# Patient Record
Sex: Male | Born: 1970 | Race: Black or African American | Hispanic: No | Marital: Single | Smoking: Never smoker
Health system: Southern US, Community
[De-identification: ages and names within clinical notes are randomized; demographics above are authoritative.]

## PROBLEM LIST (undated history)

## (undated) ENCOUNTER — Emergency Department (HOSPITAL_COMMUNITY): Admission: EM | Payer: Medicare Other | Source: Home / Self Care

## (undated) DIAGNOSIS — I4891 Unspecified atrial fibrillation: Secondary | ICD-10-CM

## (undated) DIAGNOSIS — F329 Major depressive disorder, single episode, unspecified: Secondary | ICD-10-CM

## (undated) DIAGNOSIS — I499 Cardiac arrhythmia, unspecified: Secondary | ICD-10-CM

## (undated) DIAGNOSIS — E66812 Obesity, class 2: Secondary | ICD-10-CM

## (undated) DIAGNOSIS — R002 Palpitations: Secondary | ICD-10-CM

## (undated) DIAGNOSIS — F10239 Alcohol dependence with withdrawal, unspecified: Secondary | ICD-10-CM

## (undated) DIAGNOSIS — F319 Bipolar disorder, unspecified: Secondary | ICD-10-CM

## (undated) DIAGNOSIS — E785 Hyperlipidemia, unspecified: Secondary | ICD-10-CM

## (undated) DIAGNOSIS — F419 Anxiety disorder, unspecified: Secondary | ICD-10-CM

## (undated) DIAGNOSIS — R569 Unspecified convulsions: Secondary | ICD-10-CM

## (undated) DIAGNOSIS — I10 Essential (primary) hypertension: Secondary | ICD-10-CM

## (undated) DIAGNOSIS — F1410 Cocaine abuse, uncomplicated: Secondary | ICD-10-CM

## (undated) DIAGNOSIS — F32A Depression, unspecified: Secondary | ICD-10-CM

## (undated) DIAGNOSIS — F4310 Post-traumatic stress disorder, unspecified: Secondary | ICD-10-CM

## (undated) DIAGNOSIS — R55 Syncope and collapse: Secondary | ICD-10-CM

## (undated) DIAGNOSIS — E669 Obesity, unspecified: Secondary | ICD-10-CM

## (undated) HISTORY — PX: NO PAST SURGERIES: SHX2092

## (undated) HISTORY — DX: Syncope and collapse: R55

## (undated) HISTORY — DX: Hyperlipidemia, unspecified: E78.5

## (undated) HISTORY — DX: Cardiac arrhythmia, unspecified: I49.9

## (undated) NOTE — *Deleted (*Deleted)
   06/26/20 0608  Vital Signs  Pulse Rate 92  BP 131/79  BP Location Right Arm  BP Method Automatic  Patient Position (if appropriate) Standing   D: Pt. Admitted to some passive SI, but denied HI/AVH. Patient rated anxiety 8/10 and depression 7/10. Pt. Reported that he needed to make calls in the morning. Pt. Attended group in the morning.  A:  Patient took scheduled medicine.  Pt. Could not get a second dose of vistaril form anxiety because it was too close to the first dose. Pt. Was ok with this. Support and encouragement provided Routine safety checks conducted every 15 minutes. Patient  Informed to notify staff with any concerns.   R:  Safety maintained.  Special Note:  Pt. Needs to be discharged tomorrow at 0645. Taxi needs to be called. [place] bus [place].

---

## 2000-04-04 ENCOUNTER — Emergency Department (HOSPITAL_COMMUNITY): Admission: EM | Admit: 2000-04-04 | Discharge: 2000-04-04 | Payer: Self-pay | Admitting: Emergency Medicine

## 2000-04-04 ENCOUNTER — Encounter: Payer: Self-pay | Admitting: Emergency Medicine

## 2002-02-13 ENCOUNTER — Emergency Department (HOSPITAL_COMMUNITY): Admission: EM | Admit: 2002-02-13 | Discharge: 2002-02-13 | Payer: Self-pay | Admitting: Emergency Medicine

## 2002-04-05 ENCOUNTER — Encounter: Payer: Self-pay | Admitting: Emergency Medicine

## 2002-04-05 ENCOUNTER — Emergency Department (HOSPITAL_COMMUNITY): Admission: EM | Admit: 2002-04-05 | Discharge: 2002-04-05 | Payer: Self-pay | Admitting: Emergency Medicine

## 2002-04-14 ENCOUNTER — Emergency Department (HOSPITAL_COMMUNITY): Admission: EM | Admit: 2002-04-14 | Discharge: 2002-04-14 | Payer: Self-pay | Admitting: Emergency Medicine

## 2002-04-15 ENCOUNTER — Emergency Department (HOSPITAL_COMMUNITY): Admission: EM | Admit: 2002-04-15 | Discharge: 2002-04-15 | Payer: Self-pay | Admitting: Emergency Medicine

## 2002-04-22 ENCOUNTER — Emergency Department (HOSPITAL_COMMUNITY): Admission: EM | Admit: 2002-04-22 | Discharge: 2002-04-22 | Payer: Self-pay | Admitting: Emergency Medicine

## 2002-05-02 ENCOUNTER — Emergency Department (HOSPITAL_COMMUNITY): Admission: EM | Admit: 2002-05-02 | Discharge: 2002-05-02 | Payer: Self-pay | Admitting: *Deleted

## 2002-05-04 ENCOUNTER — Emergency Department (HOSPITAL_COMMUNITY): Admission: EM | Admit: 2002-05-04 | Discharge: 2002-05-04 | Payer: Self-pay | Admitting: Emergency Medicine

## 2002-08-31 ENCOUNTER — Emergency Department (HOSPITAL_COMMUNITY): Admission: EM | Admit: 2002-08-31 | Discharge: 2002-08-31 | Payer: Self-pay | Admitting: Emergency Medicine

## 2002-10-18 ENCOUNTER — Emergency Department (HOSPITAL_COMMUNITY): Admission: EM | Admit: 2002-10-18 | Discharge: 2002-10-18 | Payer: Self-pay | Admitting: Emergency Medicine

## 2002-11-08 ENCOUNTER — Emergency Department (HOSPITAL_COMMUNITY): Admission: EM | Admit: 2002-11-08 | Discharge: 2002-11-08 | Payer: Self-pay | Admitting: *Deleted

## 2002-12-27 ENCOUNTER — Emergency Department (HOSPITAL_COMMUNITY): Admission: EM | Admit: 2002-12-27 | Discharge: 2002-12-27 | Payer: Self-pay

## 2003-04-02 ENCOUNTER — Emergency Department (HOSPITAL_COMMUNITY): Admission: AD | Admit: 2003-04-02 | Discharge: 2003-04-02 | Payer: Self-pay | Admitting: Emergency Medicine

## 2003-05-19 ENCOUNTER — Emergency Department (HOSPITAL_COMMUNITY): Admission: EM | Admit: 2003-05-19 | Discharge: 2003-05-19 | Payer: Self-pay | Admitting: Emergency Medicine

## 2003-08-29 ENCOUNTER — Emergency Department (HOSPITAL_COMMUNITY): Admission: EM | Admit: 2003-08-29 | Discharge: 2003-08-29 | Payer: Self-pay | Admitting: Emergency Medicine

## 2003-12-15 ENCOUNTER — Emergency Department (HOSPITAL_COMMUNITY): Admission: EM | Admit: 2003-12-15 | Discharge: 2003-12-15 | Payer: Self-pay | Admitting: Emergency Medicine

## 2003-12-28 ENCOUNTER — Emergency Department (HOSPITAL_COMMUNITY): Admission: EM | Admit: 2003-12-28 | Discharge: 2003-12-28 | Payer: Self-pay | Admitting: Emergency Medicine

## 2004-01-10 ENCOUNTER — Emergency Department (HOSPITAL_COMMUNITY): Admission: EM | Admit: 2004-01-10 | Discharge: 2004-01-10 | Payer: Self-pay | Admitting: Emergency Medicine

## 2004-03-26 ENCOUNTER — Emergency Department (HOSPITAL_COMMUNITY): Admission: EM | Admit: 2004-03-26 | Discharge: 2004-03-26 | Payer: Self-pay | Admitting: Emergency Medicine

## 2004-04-28 ENCOUNTER — Emergency Department (HOSPITAL_COMMUNITY): Admission: EM | Admit: 2004-04-28 | Discharge: 2004-04-28 | Payer: Self-pay | Admitting: Emergency Medicine

## 2004-05-01 ENCOUNTER — Emergency Department (HOSPITAL_COMMUNITY): Admission: EM | Admit: 2004-05-01 | Discharge: 2004-05-01 | Payer: Self-pay | Admitting: *Deleted

## 2004-05-13 ENCOUNTER — Emergency Department (HOSPITAL_COMMUNITY): Admission: EM | Admit: 2004-05-13 | Discharge: 2004-05-13 | Payer: Self-pay | Admitting: Emergency Medicine

## 2004-06-03 ENCOUNTER — Inpatient Hospital Stay (HOSPITAL_COMMUNITY): Admission: EM | Admit: 2004-06-03 | Discharge: 2004-06-06 | Payer: Self-pay | Admitting: Emergency Medicine

## 2004-07-04 ENCOUNTER — Emergency Department (HOSPITAL_COMMUNITY): Admission: EM | Admit: 2004-07-04 | Discharge: 2004-07-05 | Payer: Self-pay | Admitting: Emergency Medicine

## 2004-08-17 ENCOUNTER — Emergency Department (HOSPITAL_COMMUNITY): Admission: EM | Admit: 2004-08-17 | Discharge: 2004-08-17 | Payer: Self-pay | Admitting: Emergency Medicine

## 2004-10-10 ENCOUNTER — Emergency Department (HOSPITAL_COMMUNITY): Admission: EM | Admit: 2004-10-10 | Discharge: 2004-10-10 | Payer: Self-pay | Admitting: Emergency Medicine

## 2004-10-14 ENCOUNTER — Emergency Department (HOSPITAL_COMMUNITY): Admission: EM | Admit: 2004-10-14 | Discharge: 2004-10-14 | Payer: Self-pay | Admitting: Emergency Medicine

## 2004-10-15 ENCOUNTER — Emergency Department (HOSPITAL_COMMUNITY): Admission: EM | Admit: 2004-10-15 | Discharge: 2004-10-16 | Payer: Self-pay | Admitting: Emergency Medicine

## 2005-04-30 ENCOUNTER — Emergency Department (HOSPITAL_COMMUNITY): Admission: EM | Admit: 2005-04-30 | Discharge: 2005-04-30 | Payer: Self-pay | Admitting: Emergency Medicine

## 2005-11-13 ENCOUNTER — Emergency Department (HOSPITAL_COMMUNITY): Admission: EM | Admit: 2005-11-13 | Discharge: 2005-11-13 | Payer: Self-pay | Admitting: Emergency Medicine

## 2006-02-10 ENCOUNTER — Emergency Department (HOSPITAL_COMMUNITY): Admission: EM | Admit: 2006-02-10 | Discharge: 2006-02-10 | Payer: Self-pay | Admitting: Emergency Medicine

## 2007-05-30 ENCOUNTER — Emergency Department (HOSPITAL_COMMUNITY): Admission: EM | Admit: 2007-05-30 | Discharge: 2007-05-30 | Payer: Self-pay | Admitting: Emergency Medicine

## 2007-06-03 ENCOUNTER — Emergency Department (HOSPITAL_COMMUNITY): Admission: EM | Admit: 2007-06-03 | Discharge: 2007-06-03 | Payer: Self-pay | Admitting: Emergency Medicine

## 2007-10-20 ENCOUNTER — Emergency Department (HOSPITAL_COMMUNITY): Admission: EM | Admit: 2007-10-20 | Discharge: 2007-10-20 | Payer: Self-pay | Admitting: Emergency Medicine

## 2007-11-17 ENCOUNTER — Emergency Department (HOSPITAL_COMMUNITY): Admission: EM | Admit: 2007-11-17 | Discharge: 2007-11-17 | Payer: Self-pay | Admitting: Emergency Medicine

## 2008-01-25 ENCOUNTER — Emergency Department (HOSPITAL_COMMUNITY): Admission: EM | Admit: 2008-01-25 | Discharge: 2008-01-25 | Payer: Self-pay | Admitting: Emergency Medicine

## 2008-01-26 ENCOUNTER — Ambulatory Visit: Payer: Self-pay | Admitting: Psychiatry

## 2008-01-26 ENCOUNTER — Inpatient Hospital Stay (HOSPITAL_COMMUNITY): Admission: EM | Admit: 2008-01-26 | Discharge: 2008-01-27 | Payer: Self-pay | Admitting: Emergency Medicine

## 2008-03-09 ENCOUNTER — Emergency Department (HOSPITAL_COMMUNITY): Admission: EM | Admit: 2008-03-09 | Discharge: 2008-03-09 | Payer: Self-pay | Admitting: Emergency Medicine

## 2008-05-04 ENCOUNTER — Emergency Department (HOSPITAL_COMMUNITY): Admission: EM | Admit: 2008-05-04 | Discharge: 2008-05-04 | Payer: Self-pay | Admitting: Emergency Medicine

## 2008-06-18 ENCOUNTER — Emergency Department (HOSPITAL_COMMUNITY): Admission: EM | Admit: 2008-06-18 | Discharge: 2008-06-18 | Payer: Self-pay | Admitting: Emergency Medicine

## 2008-11-04 ENCOUNTER — Emergency Department (HOSPITAL_COMMUNITY): Admission: EM | Admit: 2008-11-04 | Discharge: 2008-11-04 | Payer: Self-pay | Admitting: Emergency Medicine

## 2008-11-06 ENCOUNTER — Emergency Department (HOSPITAL_COMMUNITY): Admission: EM | Admit: 2008-11-06 | Discharge: 2008-11-06 | Payer: Self-pay | Admitting: Emergency Medicine

## 2009-02-19 ENCOUNTER — Emergency Department (HOSPITAL_COMMUNITY): Admission: EM | Admit: 2009-02-19 | Discharge: 2009-02-19 | Payer: Self-pay | Admitting: Emergency Medicine

## 2009-03-22 ENCOUNTER — Emergency Department (HOSPITAL_COMMUNITY): Admission: EM | Admit: 2009-03-22 | Discharge: 2009-03-22 | Payer: Self-pay | Admitting: Emergency Medicine

## 2009-05-03 ENCOUNTER — Emergency Department (HOSPITAL_COMMUNITY): Admission: EM | Admit: 2009-05-03 | Discharge: 2009-05-03 | Payer: Self-pay | Admitting: Emergency Medicine

## 2009-08-05 ENCOUNTER — Emergency Department (HOSPITAL_COMMUNITY): Admission: EM | Admit: 2009-08-05 | Discharge: 2009-08-05 | Payer: Self-pay | Admitting: Emergency Medicine

## 2009-10-31 ENCOUNTER — Emergency Department (HOSPITAL_COMMUNITY): Admission: EM | Admit: 2009-10-31 | Discharge: 2009-10-31 | Payer: Self-pay | Admitting: Emergency Medicine

## 2010-01-14 ENCOUNTER — Emergency Department (HOSPITAL_COMMUNITY): Admission: EM | Admit: 2010-01-14 | Discharge: 2010-01-15 | Payer: Self-pay | Admitting: Emergency Medicine

## 2010-02-16 ENCOUNTER — Emergency Department (HOSPITAL_COMMUNITY): Admission: EM | Admit: 2010-02-16 | Discharge: 2010-02-16 | Payer: Self-pay | Admitting: Emergency Medicine

## 2010-03-03 ENCOUNTER — Emergency Department (HOSPITAL_COMMUNITY): Admission: EM | Admit: 2010-03-03 | Discharge: 2010-03-04 | Payer: Self-pay | Admitting: Emergency Medicine

## 2010-03-04 ENCOUNTER — Other Ambulatory Visit: Payer: Self-pay | Admitting: Emergency Medicine

## 2010-03-04 ENCOUNTER — Ambulatory Visit: Payer: Self-pay | Admitting: Psychiatry

## 2010-03-04 ENCOUNTER — Inpatient Hospital Stay (HOSPITAL_COMMUNITY): Admission: AD | Admit: 2010-03-04 | Discharge: 2010-03-11 | Payer: Self-pay | Admitting: Psychiatry

## 2010-04-16 ENCOUNTER — Emergency Department (HOSPITAL_COMMUNITY): Admission: EM | Admit: 2010-04-16 | Discharge: 2010-04-17 | Payer: Self-pay | Admitting: Emergency Medicine

## 2010-07-12 ENCOUNTER — Emergency Department (HOSPITAL_COMMUNITY): Admission: EM | Admit: 2010-07-12 | Discharge: 2010-07-12 | Payer: Self-pay | Admitting: Emergency Medicine

## 2010-09-10 ENCOUNTER — Emergency Department (HOSPITAL_COMMUNITY)
Admission: EM | Admit: 2010-09-10 | Discharge: 2010-09-10 | Payer: Self-pay | Source: Home / Self Care | Admitting: Emergency Medicine

## 2010-09-10 ENCOUNTER — Inpatient Hospital Stay (HOSPITAL_COMMUNITY)
Admission: AD | Admit: 2010-09-10 | Discharge: 2010-09-20 | Payer: Self-pay | Source: Home / Self Care | Attending: Psychiatry | Admitting: Psychiatry

## 2010-09-16 LAB — DIFFERENTIAL
Basophils Absolute: 0 10*3/uL (ref 0.0–0.1)
Basophils Relative: 1 % (ref 0–1)
Eosinophils Absolute: 0.1 10*3/uL (ref 0.0–0.7)
Eosinophils Relative: 1 % (ref 0–5)
Lymphocytes Relative: 22 % (ref 12–46)
Lymphs Abs: 1.8 10*3/uL (ref 0.7–4.0)
Monocytes Absolute: 0.8 10*3/uL (ref 0.1–1.0)
Monocytes Relative: 9 % (ref 3–12)
Neutro Abs: 5.7 10*3/uL (ref 1.7–7.7)
Neutrophils Relative %: 67 % (ref 43–77)

## 2010-09-16 LAB — CBC
HCT: 42.3 % (ref 39.0–52.0)
Hemoglobin: 14.1 g/dL (ref 13.0–17.0)
MCH: 27.8 pg (ref 26.0–34.0)
MCHC: 33.3 g/dL (ref 30.0–36.0)
MCV: 83.4 fL (ref 78.0–100.0)
Platelets: 263 10*3/uL (ref 150–400)
RBC: 5.07 MIL/uL (ref 4.22–5.81)
RDW: 14.4 % (ref 11.5–15.5)
WBC: 8.5 10*3/uL (ref 4.0–10.5)

## 2010-09-16 LAB — COMPREHENSIVE METABOLIC PANEL
ALT: 16 U/L (ref 0–53)
AST: 23 U/L (ref 0–37)
Albumin: 4.1 g/dL (ref 3.5–5.2)
Alkaline Phosphatase: 97 U/L (ref 39–117)
BUN: 6 mg/dL (ref 6–23)
CO2: 23 mEq/L (ref 19–32)
Calcium: 9.1 mg/dL (ref 8.4–10.5)
Chloride: 108 mEq/L (ref 96–112)
Creatinine, Ser: 0.98 mg/dL (ref 0.4–1.5)
GFR calc Af Amer: >60 mL/min (ref >60)
GFR calc non Af Amer: >60 mL/min (ref >60)
Glucose, Bld: 87 mg/dL (ref 70–99)
Potassium: 3.6 mEq/L (ref 3.5–5.1)
Sodium: 140 mEq/L (ref 135–145)
Total Bilirubin: 0.8 mg/dL (ref 0.3–1.2)
Total Protein: 7.8 g/dL (ref 6.0–8.3)

## 2010-09-16 LAB — RAPID URINE DRUG SCREEN, HOSP PERFORMED
Amphetamines: NOT DETECTED
Barbiturates: NOT DETECTED
Benzodiazepines: POSITIVE — AB
Cocaine: POSITIVE — AB
Opiates: NOT DETECTED
Tetrahydrocannabinol: NOT DETECTED

## 2010-09-16 LAB — ETHANOL: Alcohol, Ethyl (B): <5 mg/dL (ref 0–10)

## 2010-09-23 NOTE — Discharge Summary (Addendum)
[name], [name] NO.:  [id number]  MEDICAL RECORD NO.:  [id number]          PATIENT TYPE:  IPS  LOCATION:  0407                          [facility]ACILITY[facility]  [facility]  PHYSICIAN:  Anselm Jungling, MD  DATE OF BIRTH:  11/19/1970  DATE OF ADMISSION:  09/10/2010 DATE OF DISCHARGE:  09/20/2010                              DISCHARGE SUMMARY   IDENTIFYING DATA AND REASON FOR ADMISSION:  This was an inpatient psychiatric admission for [name], a 40 year old, single, African American male who was admitted because of increasing depression, suicidal ideation, and benzodiazepine dependence.  Please refer to the admission note for further details pertaining to the symptoms, circumstances and history that led to his hospitalization.  He was given an initial Axis I diagnosis of schizoaffective disorder NOS.  MEDICAL AND LABORATORY:  The patient was medically and physically assessed by the psychiatric nurse practitioner.  He was in good health without any active or chronic medical problems, except for hypertension. He was continued on his usual regimen of lisinopril and Norvasc.  At the time of discharge, he was referred to his usual medical provider for follow-up of same.  HOSPITAL COURSE:  The patient was admitted to the adult inpatient psychiatric service.  He presented as a well-nourished, normally- developed, adult male who was pleasant and cooperative, but quite depressed, shy, reserved, and internally preoccupied.  However, there did not appear to be any significant psychosis or thought disorder. Nonetheless, he had been relying on Ativan for anxiety, and it was felt that it was not in his best interest to continue on this medication owing to its potential for habituation.  He was tapered off of Ativan with Librium, and a trial of risperidone, low-dose, was initiated to address anxiety symptoms.  This proved to be very helpful and well tolerated.  In addition, he was started  on a trial of Lexapro 10 mg daily for depression.  He participated in therapeutic groups and activities, and was a good participant with good attendance and as his stay progressed, he began to open up a bit more.  Improvement in his mood was gradual and day-by-day.  He indicated by the time of discharge that he was no longer having any suicidal thoughts. He was smiling appropriately at times, and indicated that he felt more confident about discharge.  He was appropriate for discharge on the 11th hospital day and agreed to the following aftercare plan.  AFTERCARE:  The patient was to [facility] up [facility], under Dr. Clyde Canterbury.  DISCHARGE MEDICATIONS: 1. Lexapro 10 mg daily. 2. Risperdal 1 mg b.i.d. and 2 mg q.h.s. 3. Lisinopril 20 mg daily. 4. Norvasc 10 mg daily. 5. Paxil was discontinued.  DISCHARGE DIAGNOSES:  Axis I:  Schizoaffective disorder, most recently depressed without psychotic features. Axis II:  Deferred. Axis III:  Hypertension. Axis IV:  Stressors severe.Axis V:  GAF on discharge 50.     Anselm Jungling, MD     SPB/MEDQ  D:  09/20/2010  T:  09/20/2010  Job:  [id number]  Electronically Signed by Geralyn Flash MD on 09/23/2010  10:59:29 AM

## 2010-11-04 NOTE — H&P (Signed)
  NAMETORY, SEPTER NO.:  [id number]  MEDICAL RECORD NO.:  [id number]          PATIENT TYPE:  IPS  LOCATION:  0404                          FACILITY:  [facility]  PHYSICIAN:  Landry Corporal, N.P.    DATE OF BIRTH:  29-Oct-1970  DATE OF ADMISSION:  09/10/2010 DATE OF DISCHARGE:                      PSYCHIATRIC ADMISSION ASSESSMENT   HISTORY OF PRESENT ILLNESS:  The patient states that he is here because he is having "too much stress."  He states that he was having thoughts to hurt himself, did have a plan to "get high" and then maybe get somebody angry and that they may hurt him.  He denies any psychotic symptoms.  He denies any homicidal ideation.  PAST PSYCHIATRIC HISTORY:  The patient's second admission.  The patient was here in July 2011 with panic attacks and flashbacks, reporting a history of PTSD.  His outpatient providers are the [facility].  SOCIAL HISTORY:  Patient is 40 years old.  He lives in [place], currently living with family and is on disability.  FAMILY HISTORY:  Unknown.  PRIMARY CARE PROVIDER:  Dr. Fleet Contras.  MEDICAL PROBLEMS:  History of hypertension and asthma.  MEDICATIONS: 1. Amlodipine 10/20 one daily. 2. Paxil 40 mg daily. 3. Risperdal 1 mg b.i.d. 4. Lorazepam 2 mg t.i.d. 5. Flexeril 10 t.i.d. as needed. 6. ProAir q.4 h. 7. Aspirin 325 mg 2 q.6 as needed for headache or pain.  DRUG ALLERGIES:  No known allergies.  PHYSICAL EXAM:  This is a normally-developed male.  He appears in no distress.  He appears well-nourished.  Physical exam was reviewed.  Of note, the patient had multiple linear scars on arms bilaterally, healed. The patient has a history of a cutter.  LABORATORY DATA:  Shows a urine drug screen positive for cocaine, positive for benzodiazepines.  CMET within normal limits.  Alcohol level less than 5.  CBC is within normal limits.  MENTAL STATUS EXAM:  The patient is in bed with no eye contact.   States complaint of being tired.  He is casually dressed at this time.  He does not appear to be internally distracted.  AXIS I: 1. Depressive disorder. 2. Cocaine abuse. AXIS II:  Deferred. AXIS III:  History of hypertension. AXIS IV:  Possible problems with primary support group, as the patient is endorsing stress with family. AXIS V:  Current is 35.  PLAN:  Our plan is to continue his Paxil and his hypertensive medications.  We will address his substance use.  We will discontinue the Ativan and have Librium available for withdrawal.  Reinforce medical compliance.  Continue to gather more history and identify specific stressors.  His tentative length of stay is 3-5 days.     Landry Corporal, N.P.     JO/MEDQ  D:  09/11/2010  T:  09/11/2010  Job:  [id number]  Electronically Signed by Limmie PatriciaP. on 09/16/2010 02:06:55 PM Electronically Signed by Geralyn Flash MD on 11/04/2010 11:01:14 AM

## 2010-11-12 LAB — CBC
HCT: 40.2 % (ref 39.0–52.0)
Hemoglobin: 13.9 g/dL (ref 13.0–17.0)
MCH: 27.9 pg (ref 26.0–34.0)
MCHC: 34.6 g/dL (ref 30.0–36.0)
MCV: 80.6 fL (ref 78.0–100.0)
Platelets: 218 10*3/uL (ref 150–400)
RBC: 4.99 MIL/uL (ref 4.22–5.81)
RDW: 13.4 % (ref 11.5–15.5)
WBC: 8.6 10*3/uL (ref 4.0–10.5)

## 2010-11-12 LAB — COMPREHENSIVE METABOLIC PANEL
ALT: 12 U/L (ref 0–53)
AST: 24 U/L (ref 0–37)
Albumin: 3.9 g/dL (ref 3.5–5.2)
Alkaline Phosphatase: 89 U/L (ref 39–117)
BUN: 6 mg/dL (ref 6–23)
CO2: 22 mEq/L (ref 19–32)
Calcium: 8.6 mg/dL (ref 8.4–10.5)
Chloride: 103 mEq/L (ref 96–112)
Creatinine, Ser: 0.82 mg/dL (ref 0.4–1.5)
GFR calc Af Amer: >60 mL/min (ref >60)
GFR calc non Af Amer: >60 mL/min (ref >60)
Glucose, Bld: 96 mg/dL (ref 70–99)
Potassium: 3.6 mEq/L (ref 3.5–5.1)
Sodium: 133 mEq/L — ABNORMAL LOW (ref 135–145)
Total Bilirubin: 0.8 mg/dL (ref 0.3–1.2)
Total Protein: 7.4 g/dL (ref 6.0–8.3)

## 2010-11-12 LAB — DIFFERENTIAL
Basophils Absolute: 0 10*3/uL (ref 0.0–0.1)
Basophils Relative: 0 % (ref 0–1)
Eosinophils Absolute: 0.2 10*3/uL (ref 0.0–0.7)
Eosinophils Relative: 2 % (ref 0–5)
Lymphocytes Relative: 23 % (ref 12–46)
Lymphs Abs: 2 10*3/uL (ref 0.7–4.0)
Monocytes Absolute: 0.6 10*3/uL (ref 0.1–1.0)
Monocytes Relative: 7 % (ref 3–12)
Neutro Abs: 5.7 10*3/uL (ref 1.7–7.7)
Neutrophils Relative %: 67 % (ref 43–77)

## 2010-11-12 LAB — LIPASE, BLOOD: Lipase: 16 U/L (ref 11–59)

## 2010-11-14 LAB — RAPID URINE DRUG SCREEN, HOSP PERFORMED
Amphetamines: NOT DETECTED
Barbiturates: NOT DETECTED
Benzodiazepines: POSITIVE — AB
Cocaine: POSITIVE — AB
Opiates: NOT DETECTED
Tetrahydrocannabinol: NOT DETECTED

## 2010-11-14 LAB — CBC
HCT: 41.6 % (ref 39.0–52.0)
Hemoglobin: 14 g/dL (ref 13.0–17.0)
MCH: 27.8 pg (ref 26.0–34.0)
MCHC: 33.6 g/dL (ref 30.0–36.0)
MCV: 82.7 fL (ref 78.0–100.0)
Platelets: 268 10*3/uL (ref 150–400)
RBC: 5.03 MIL/uL (ref 4.22–5.81)
RDW: 14.9 % (ref 11.5–15.5)
WBC: 8.4 10*3/uL (ref 4.0–10.5)

## 2010-11-14 LAB — DIFFERENTIAL
Basophils Absolute: 0 10*3/uL (ref 0.0–0.1)
Basophils Relative: 1 % (ref 0–1)
Eosinophils Absolute: 0 10*3/uL (ref 0.0–0.7)
Eosinophils Relative: 0 % (ref 0–5)
Lymphocytes Relative: 12 % (ref 12–46)
Lymphs Abs: 1 10*3/uL (ref 0.7–4.0)
Monocytes Absolute: 0.7 10*3/uL (ref 0.1–1.0)
Monocytes Relative: 8 % (ref 3–12)
Neutro Abs: 6.6 10*3/uL (ref 1.7–7.7)
Neutrophils Relative %: 79 % — ABNORMAL HIGH (ref 43–77)

## 2010-11-14 LAB — BASIC METABOLIC PANEL
BUN: 7 mg/dL (ref 6–23)
CO2: 26 mEq/L (ref 19–32)
Calcium: 8.8 mg/dL (ref 8.4–10.5)
Chloride: 105 mEq/L (ref 96–112)
Creatinine, Ser: 1.01 mg/dL (ref 0.4–1.5)
GFR calc Af Amer: >60 mL/min (ref >60)
GFR calc non Af Amer: >60 mL/min (ref >60)
Glucose, Bld: 85 mg/dL (ref 70–99)
Potassium: 3.8 mEq/L (ref 3.5–5.1)
Sodium: 140 mEq/L (ref 135–145)

## 2010-11-14 LAB — TRICYCLICS SCREEN, URINE: TCA Scrn: NOT DETECTED

## 2010-11-14 LAB — ETHANOL: Alcohol, Ethyl (B): 9 mg/dL (ref 0–10)

## 2010-11-17 LAB — CBC
HCT: 42.1 % (ref 39.0–52.0)
Hemoglobin: 14.2 g/dL (ref 13.0–17.0)
MCH: 27.8 pg (ref 26.0–34.0)
MCHC: 32.9 g/dL (ref 30.0–36.0)
MCHC: 33.7 g/dL (ref 30.0–36.0)
MCV: 83.2 fL (ref 78.0–100.0)
Platelets: 270 10*3/uL (ref 150–400)
RBC: 4.87 MIL/uL (ref 4.22–5.81)
RDW: 14.6 % (ref 11.5–15.5)
RDW: 14.4 % (ref 11.5–15.5)
WBC: 7.1 10*3/uL (ref 4.0–10.5)

## 2010-11-17 LAB — DIFFERENTIAL
Basophils Absolute: 0 10*3/uL (ref 0.0–0.1)
Basophils Relative: 1 % (ref 0–1)
Eosinophils Absolute: 0.3 10*3/uL (ref 0.0–0.7)
Eosinophils Absolute: 0.1 10*3/uL (ref 0.0–0.7)
Eosinophils Relative: 1 % (ref 0–5)
Lymphs Abs: 1.2 10*3/uL (ref 0.7–4.0)
Lymphs Abs: 1.3 10*3/uL (ref 0.7–4.0)
Monocytes Relative: 10 % (ref 3–12)
Neutrophils Relative %: 59 % (ref 43–77)

## 2010-11-17 LAB — RAPID URINE DRUG SCREEN, HOSP PERFORMED
Amphetamines: NOT DETECTED
Barbiturates: NOT DETECTED
Barbiturates: NOT DETECTED
Opiates: NOT DETECTED

## 2010-11-17 LAB — BASIC METABOLIC PANEL
BUN: 7 mg/dL (ref 6–23)
Chloride: 107 mEq/L (ref 96–112)
Glucose, Bld: 111 mg/dL — ABNORMAL HIGH (ref 70–99)
Potassium: 3.5 mEq/L (ref 3.5–5.1)
Sodium: 137 mEq/L (ref 135–145)

## 2010-11-17 LAB — POCT I-STAT, CHEM 8
BUN: 7 mg/dL (ref 6–23)
Calcium, Ion: 1.13 mmol/L (ref 1.12–1.32)
Chloride: 106 mEq/L (ref 96–112)
Creatinine, Ser: 0.9 mg/dL (ref 0.4–1.5)
Glucose, Bld: 104 mg/dL — ABNORMAL HIGH (ref 70–99)
Potassium: 3.7 mEq/L (ref 3.5–5.1)

## 2010-11-17 LAB — ETHANOL: Alcohol, Ethyl (B): <5 mg/dL (ref 0–10)

## 2010-12-03 LAB — DIFFERENTIAL
Basophils Relative: 1 % (ref 0–1)
Eosinophils Absolute: 0.1 10*3/uL (ref 0.0–0.7)
Lymphocytes Relative: 20 % (ref 12–46)
Monocytes Relative: 10 % (ref 3–12)
Neutro Abs: 4.5 10*3/uL (ref 1.7–7.7)
Neutrophils Relative %: 67 % (ref 43–77)

## 2010-12-03 LAB — RAPID URINE DRUG SCREEN, HOSP PERFORMED
Amphetamines: NOT DETECTED
Barbiturates: NOT DETECTED
Benzodiazepines: NOT DETECTED
Tetrahydrocannabinol: NOT DETECTED

## 2010-12-03 LAB — ETHANOL: Alcohol, Ethyl (B): <5 mg/dL (ref 0–10)

## 2010-12-03 LAB — CBC
Hemoglobin: 13.4 g/dL (ref 13.0–17.0)
MCHC: 32.3 g/dL (ref 30.0–36.0)
RBC: 4.88 MIL/uL (ref 4.22–5.81)
WBC: 6.8 10*3/uL (ref 4.0–10.5)

## 2010-12-03 LAB — BASIC METABOLIC PANEL
Calcium: 8.6 mg/dL (ref 8.4–10.5)
Creatinine, Ser: 0.8 mg/dL (ref 0.4–1.5)
GFR calc Af Amer: >60 mL/min (ref >60)
GFR calc non Af Amer: >60 mL/min (ref >60)
Sodium: 139 mEq/L (ref 135–145)

## 2010-12-04 ENCOUNTER — Inpatient Hospital Stay (HOSPITAL_COMMUNITY)
Admission: AD | Admit: 2010-12-04 | Discharge: 2010-12-11 | DRG: 885 | Disposition: A | Discharge status: Home / Self Care | Payer: Medicare Other | Source: Ambulatory Visit | Attending: Psychiatry | Admitting: Psychiatry

## 2010-12-04 ENCOUNTER — Emergency Department (HOSPITAL_COMMUNITY)
Admission: EM | Admit: 2010-12-04 | Discharge: 2010-12-04 | Disposition: A | Discharge status: Other Acute Inpatient Hospital | Payer: Medicare Other | Source: Home / Self Care | Attending: Emergency Medicine | Admitting: Emergency Medicine

## 2010-12-04 DIAGNOSIS — F607 Dependent personality disorder: Secondary | ICD-10-CM

## 2010-12-04 DIAGNOSIS — E663 Overweight: Secondary | ICD-10-CM

## 2010-12-04 DIAGNOSIS — J45909 Unspecified asthma, uncomplicated: Secondary | ICD-10-CM

## 2010-12-04 DIAGNOSIS — F1410 Cocaine abuse, uncomplicated: Secondary | ICD-10-CM

## 2010-12-04 DIAGNOSIS — F4310 Post-traumatic stress disorder, unspecified: Secondary | ICD-10-CM

## 2010-12-04 DIAGNOSIS — I10 Essential (primary) hypertension: Secondary | ICD-10-CM

## 2010-12-04 DIAGNOSIS — F339 Major depressive disorder, recurrent, unspecified: Principal | ICD-10-CM

## 2010-12-04 LAB — RAPID URINE DRUG SCREEN, HOSP PERFORMED
Amphetamines: NOT DETECTED
Barbiturates: NOT DETECTED
Benzodiazepines: POSITIVE — AB
Cocaine: POSITIVE — AB

## 2010-12-04 LAB — CBC
Hemoglobin: 14.1 g/dL (ref 13.0–17.0)
MCV: 81.3 fL (ref 78.0–100.0)
Platelets: 242 10*3/uL (ref 150–400)
RBC: 5.2 MIL/uL (ref 4.22–5.81)
WBC: 7.4 10*3/uL (ref 4.0–10.5)

## 2010-12-04 LAB — ETHANOL: Alcohol, Ethyl (B): <5 mg/dL (ref 0–10)

## 2010-12-04 LAB — DIFFERENTIAL
Basophils Relative: 0 % (ref 0–1)
Eosinophils Absolute: 0 10*3/uL (ref 0.0–0.7)
Lymphs Abs: 1.7 10*3/uL (ref 0.7–4.0)
Monocytes Relative: 9 % (ref 3–12)
Neutro Abs: 5 10*3/uL (ref 1.7–7.7)
Neutrophils Relative %: 67 % (ref 43–77)

## 2010-12-04 LAB — COMPREHENSIVE METABOLIC PANEL
CO2: 23 mEq/L (ref 19–32)
Calcium: 8.8 mg/dL (ref 8.4–10.5)
Creatinine, Ser: 0.76 mg/dL (ref 0.4–1.5)
GFR calc non Af Amer: >60 mL/min (ref >60)
Glucose, Bld: 102 mg/dL — ABNORMAL HIGH (ref 70–99)
Total Protein: 7.8 g/dL (ref 6.0–8.3)

## 2010-12-05 DIAGNOSIS — F1410 Cocaine abuse, uncomplicated: Secondary | ICD-10-CM

## 2010-12-09 NOTE — H&P (Signed)
[name], [name] NO.:  [id number]  MEDICAL RECORD NO.:  [id number]           PATIENT TYPE:  I  LOCATION:  0300                          [facility]ACILITY:  [facility]  PHYSICIAN:  Anselm Jungling, MD       DATE OF BIRTH:  DATE OF ADMISSION:  12/04/2010 DATE OF DISCHARGE:                      PSYCHIATRIC ADMISSION ASSESSMENT   HISTORY OF PRESENT ILLNESS:  The patient is a 40 year old male voluntarily admitted on 12/04/2010.  The patient presented to the emergency department reporting symptoms of depression and PTSD symtoms and suicidal thoughts with no plan. Endorsing auditory hallucinations derogatory in nature. Reporting that he feels like his life has been over.  He recently did some cocaine because he was stressed out and was "put out" of his home after an argument with his father.  He states his father is in charge of his Social Security payments. Pt. was reporting "suicidal thoughts to an extent."  PAST PSYCHIATRIC HISTORY:  The patient was here in January 2012.  He is on the ACT team called [facility].  SOCIAL HISTORY:  The patient is living with his parents.  He is single. He is on disability.  FAMILY HISTORY:  None.  SOCIAL HISTORY:  Recent use of cocaine.  PRIMARY CARE PHYSICIAN:  _Dr. Avbuere__.  PAST MEDICAL HISTORY:  Hypertension and asthma.  MEDICATIONS:  The patient lists: 1. Paxil 40 mg daily. 2. Amlodipine 10 mg daily. 3. Naprosyn 500 mg daily. 4. Flexeril 10 mg q.8 h p.r.n. 5. Risperdal 2 mg 1 tablet daily. 6. Lorazepam 2 mg 1 tablet daily. 7. Proair 2 puffs every 4 hours as needed. 8. Trazodone for sleep.  DRUG ALLERGIES:  No known allergies.  PHYSICAL EXAMINATION:  GENERAL:  This is a normally developed male.  He has multiple healed scars to have both forearms from past cutting. Potassium low at 3.1.  Urine drug screen positive for  cocaine, positive for benzodiazepines.  Alcohol level less than 5.  CBC is within  normal limits.  MENTAL STATUS EXAM:  The patient is in his room.  He is dressed in scrubs.  He has intermittent eye contact. Speech is soft spoken.  He is polite.  Denies any current suicidal thoughts or homicidal ideation and does not appear to be actively responding, reporting that he is tired from being in the emergency room most of the night.  Thought process overall are coherent and goal directed.  AXIS I:  Schizoaffective disorder, PTSD, cocaine abuse. AXIS II:  Deferred. AXIS III:  History of hypertension, asthma. AXIS IV:  Problems with his father, possible problems related to problems related to housing.  Other psychosocial problems relate to burden of illness. AXIS V:  Current is 35.  PLAN:  Our plan is to continue the patient's psychotropic medications. We will discontinue his lorazepam at this time, address his cocaine use, contact father for returning to prior living situation.  ACT team to come see the patient for continued follow-up.  Length of stay 2 to 4 days.     Landry Corporal, N.P.   ______________________________ Anselm Jungling, MD    JO/MEDQ  D:  12/05/2010  T:  12/05/2010  Job:  [id number]  Electronically Signed by Limmie Patricia.P. on 12/05/2010 01:58:35 PM Electronically Signed by Geralyn Flash MD on 12/09/2010 11:32:50 AM

## 2010-12-12 NOTE — Discharge Summary (Signed)
NAMEDEVINN, HURWITZ NO.:  [id number]  MEDICAL RECORD NO.:  [id number]           PATIENT TYPE:  I  LOCATION:  0300                          [facility]ACILITY:  [facility]  PHYSICIAN:  Anselm Jungling, MD  DATE OF BIRTH:  1971/03/20  DATE OF ADMISSION:  12/04/2010 DATE OF DISCHARGE:  12/11/2010                              DISCHARGE SUMMARY   IDENTIFYING DATA/REASON FOR ADMISSION:  This was one of many [facility] admissions for [name], a 40 year old single African American male, who was admitted because of increasing mood disturbance and crisis.  Please refer to the admission note for further details pertaining to the symptoms, circumstances, and history that led to his hospitalization. He was given an initial Axis I diagnosis of mood disorder NOS.  MEDICAL AND LABORATORY:  The patient was medically and physically assessed by the psychiatric nurse practitioner.  He came to [place] with a history of hypertension.  He was continued on his usual Norvasc.  There were no significant medical issues during his stay.  HOSPITAL COURSE:  The patient was admitted to the Adult Inpatient Psychiatric Service.  He presented as an overweight, but normally- developed [place] American male with moderately depressed mood and anxious affect.  Thoughts and speech were normally organized.  There were no signs or symptoms of psychosis or thought disorder.  He was absent of any active suicidal ideation through his entire stay.  Suicide risk assessment was completed on admission and at the time of discharge and he was felt to be at minimal risk.  He participated in therapeutic groups and activities geared towards helping him acquire better coping skills, a better understanding of his underlying disorders and dynamics, and the development of a sound aftercare plan.  He was continued on his regiment of Paxil 40 mg daily and risperidone 0.5 mg b.i.d., and these were well tolerated.  [name] had been followed  by the [facility] treatment team.  He had also been living with his parents.  Both his parents and [facility] were quite supportive of him and were in contact with him during his stay.  By the eighth hospital day, it appeared that he had benefited maximally from his inpatient stay.  He had been absent of any active suicidal ideation throughout.  That being said, [name] is a young man who has had chronic difficulties establishing a satisfactory pattern of gratifying locations, past times, and activities, as well as satisfying social relationships.  This being the case, he continues to be at risk for more or less chronic levels of depression, with poor outlook, and varying levels of hopelessness.  Fortunately, with the support available from his family and the [facility] team, it is hoped that he will have enough in the way of day-to-day personal contact to maintain some forward momentum in his future living.  He was to resume contact with [facility] immediately upon discharge,which also involved returning to his parents' home.  DISCHARGE MEDICATIONS: 1. Norvasc 10 mg daily. 2. Paxil 40 mg daily. 3. Risperdal 0.5 mg b.i.d.  DISCHARGE DIAGNOSES:  Axis I:  Major depressive disorder,  recurrent. Axis II:  Passive dependent personality traits. Axis III:  Hypertension. Axis IV:  Stressors, severe. Axis V: Global assessment of functioning on discharge 50.     Anselm Jungling, MD     SPB/MEDQ  D:  12/11/2010  T:  12/12/2010  Job:  [id number]  Electronically Signed by Geralyn Flash MD on 12/12/2010 09:42:55 AM

## 2011-01-14 NOTE — Consult Note (Signed)
[name], [name] NO.:  [id number]   MEDICAL RECORD NO.:  [id number]          PATIENT TYPE:  INP   LOCATION:  1234                         FACILITY:  [facility]   PHYSICIAN:  Anselm Jungling, MD  DATE OF BIRTH:  03/17/1971   DATE OF CONSULTATION:  01/26/2008  DATE OF DISCHARGE:                                 CONSULTATION   IDENTIFYING DATA AND REASON FOR REFERRAL:  The patient is a 40 year old  African American male who was admitted in the aftermath of an overdose  of Klonopin.  Psychiatric consultation is requested to assess mental  status and make recommendations.   HISTORY OF PRESENTING PROBLEM:  The following information is obtained  from the patient's attending physician; the [facility] chart;  and the  patient himself, who was not felt to be a necessarily reliable  historian.   The patient was admitted to [facility] due to a Klonopin  overdose.  The patient has made statements since his admission  indicating that he was not trying to end his life, but was upset because  he was having difficulty contacting his family.  He states that he took  a few more than I should have.   He denies any problem with substance abuse, even though his urine drug  screen was positive for cocaine, and at the time of his admission, he  had a blood alcohol level of 0.114, legally intoxicated.   The patient states that he is disabled due to post-traumatic stress  disorder, that he states is based on a history of abuse.  He does not  provide any more details.  He states that he had been under the care of  a psychiatrist, Dr. Betti Cruz, but that Dr. Betti Cruz apparently discharged him  from his practice for not showing up for appointments.   The patient states that his disability funds are managed by his brother.  He states that he lives in his own apartment.   The patient indicates that he has been stressed recently by being behind  in his rent.   MEDICAL HISTORY:   Please refer to the admission history and physical.   MENTAL STATUS AND OBSERVATIONS:  The patient is a well-nourished,  normally-developed African American male who I interviewed in his  hospital room.  He had a one-to-one sitter nearby.  He was awake, alert,  and fully oriented.  He was generally pleasant and appropriate.  His  thoughts and speech were normally organized.  There was nothing to  suggest any underlying formal thought disorder, cognitive or memory  impairment, psychosis or delusionality.   He told me that he had taken a few too many Klonopin, in part because  he was upset at not being able to contact his family and because he was  having severe panic attacks.  I asked him if he has a substance abuse  problem and he stated that he did not.  I then confronted him with the  information about his urine drug screen being positive for cocaine, and  at that point he admitted to having used  cocaine, but indicated that it  was just one occasion and that he has not had any pattern of cocaine  abuse.  I asked him about the fact that he had a significant blood  alcohol level, and he admitted that he had also been drinking.   We talked about the possibility of providing him various services that  might be of benefit to him, including help with treatment for depression  or substance abuse.  He again stated that he does not have a substance  abuse problem.  Nonetheless, I suggested to him the possibility of a  brief inpatient stay in our psychiatric service.  He indicated that the  only help that he wanted at this time was help getting a different payee  other than his brother and having control of his disability funds  returned to him.  He was not interested in any referrals for outpatient  treatment, or inpatient treatment.   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  He does not appear  to display any  signs or symptoms of any mood disturbance at this time  nor  psychotic disorder.  My best estimation is that this individual is  likely an individual who is chemically dependent.  I cannot state  conclusively that he does not have panic disorder or post-traumatic  stress disorder, but it may be that he presents with this complaint and  his history in order to obtain medication such as benzodiazepines.  His  statements that he had just used cocaine once this week and is not  involved in any regular substance abuse is not credible to me.  When the  patient is offered direct means of psychiatric and substance abuse  treatment and help, he indicates that he is only interested in regaining  control of his disability funds.   DIAGNOSTIC IMPRESSION:  AXIS I:  Rule out polysubstance abuse.  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  AXIS III:  Status post overdose.  AXIS IV:  Stressors, severe.  AXIS V:  GAF 55.   RECOMMENDATIONS:  The patient is not interested in any offers of  treatment.  He is not meeting criteria for any form of involuntary  commitment.  He gives firm reassurances that this was not a suicide  attempt and that he is not having any suicidal ideation now.  As such, I  have no specific recommendations regarding his care at this time.   IDENTIFICATION:  It is fairly likely that at some point he may re-  present to [facility] emergency services again with complaints that may  be geared towards obtaining substances of chemical abuse.   Please contact me if I can be of any further assistance.  Cell phone [phone].      Anselm Jungling, MD  Electronically Signed     SPB/MEDQ  D:  01/26/2008  T:  01/26/2008  Job:  [phone]

## 2011-01-14 NOTE — H&P (Signed)
NAMEJONEL, WELDON NO.:  [id number]   MEDICAL RECORD NO.:  [id number]          PATIENT TYPE:  INP   LOCATION:  1234                         FACILITY:  [facility]   PHYSICIAN:  Della Goo, M.D. DATE OF BIRTH:  Apr 17, 1971   DATE OF ADMISSION:  01/25/2008  DATE OF DISCHARGE:                              HISTORY & PHYSICAL   DATE OF ADMISSION:  Jan 26, 2008   PRIMARY CARE PHYSICIAN:  Unassigned.   CHIEF COMPLAINT:  Overdose.   HISTORY OF PRESENT ILLNESS:  This is a 40 year old male who was brought  to the emergency department emergently after ingestion of ten 2 mg  tablets of Klonopin today right after they had been prescribed to him in  the emergency department.  The patient also ingested cocaine and this  was a reported attempt to kill itself.  The patient initially was unable  to give any history of the event secondary to oversedation and  obtundation.  The patient was evaluated in the emergency department and  referred for admission and observation and for psychiatric evaluation.   PAST MEDICAL HISTORY:  Seizure disorder.   MEDICATIONS:  Include Klonopin 2 mg per day.   ALLERGIES:  NO KNOWN DRUG ALLERGIES.   PAST SURGICAL HISTORY:  None.   SOCIAL HISTORY:  Non smoker, drinker and illicit drug user.   FAMILY HISTORY:  Unable to obtain.   REVIEW OF SYSTEMS:  Pertinent's are mentioned.   PHYSICAL EXAMINATION FINDINGS:  This is a 40 year old morbidly obese  male who is currently obtunded, but in no acute distress.  VITAL SIGNS: His vital signs are temperature 98.1, blood pressure  150/74, heart rate 102, respirations 19.  His O2 saturations are 97% on  room air.  HEENT: Examination normocephalic, atraumatic.  Pupils are sluggish, but  reactive to light bilaterally.  Oropharynx is clear.  NECK: Is supple,  full range of motion.  No thyromegaly, adenopathy, jugular venous  distention.  CARDIOVASCULAR:  Mild tachycardia.  LUNGS:  Clear to  auscultation bilaterally.  ABDOMEN: Positive bowel sounds, soft, nontender, nondistended.  EXTREMITIES:  Upper arms, both upper extremities revealed multiple scars  from self-mutilation cuts.  They are linear and along the entire dorsal  surface of the arm.  The extremities otherwise are without cyanosis,  clubbing or edema.  NEUROLOGIC: The patient is obtunded, however, he is arousable and he is  able to move all four of his extremities and there are no focal  deficits.   LABORATORY STUDIES:  White blood cell count 7.0, hemoglobin 14.0,  hematocrit 41.9, platelets 249, neutrophils 70%, lymphocytes 23%.  Sodium 142, potassium 3.4, chloride 109, bicarb 23, BUN 4, creatinine  0.93 and glucose 93.  Urine drug screen positive for cocaine and  benzodiazepines.  Alcohol level 114.  Acetaminophen less than 10.0.  Salicylates less than 4.0.  Urinalysis reveals trace hemoglobin.   ASSESSMENT:  A 40 year old male being admitted with:  1. Polysubstance overdose.  2. Cocaine abuse.  3. Elevated blood pressure.  4. Suicide attempt.  5. Mild hypokalemia.   PLAN:  The patient will be admitted to  an area for cardiac monitoring.  The patient will be n.p.o. while he is obtunded.  IV fluids have been  ordered for maintenance therapy.  The patient will be placed on suicide  and seizure precautions.  DVT and GI prophylaxis have also been ordered.  Once the patient is medically clear, a psychiatric evaluation will be  requested.  Patient has a one-to-one Comptroller.      Della Goo, M.D.  Electronically Signed     HJ/MEDQ  D:  01/26/2008  T:  01/26/2008  Job:  [id number]

## 2011-01-14 NOTE — Discharge Summary (Signed)
[name], [name] NO.:  [id number]   MEDICAL RECORD NO.:  [id number]          PATIENT TYPE:  INP   LOCATION:  1234                         FACILITY:  [facility]   PHYSICIAN:  Eduard Clos, MDDATE OF BIRTH:  1971/05/31   DATE OF ADMISSION:  01/25/2008  DATE OF DISCHARGE:  01/27/2008                               DISCHARGE SUMMARY   COURSE IN THE HOSPITAL:  A 40 year old male with history of  posttraumatic disorder was brought into the ER.  The patient had taken  an overdose of his Klonopin which was prescribed early in the morning at  the ER.  The patient also initially was found to be positive for cocaine  and also with alcohol level around 114.  The patient was admitted to the  step-down unit and observed further.  At admission time it was not known  if it was an intentional overdose with suicide as the patient was  obtunded.  As the patient became more alert and awake, the patient  stated that he did not take it in a suicidal attempt, it was just that  he was stressed and unable to reach his family, as he had issues with  his disability checks.  Psychiatry consult was obtained with Dr. Electa Sniff  who advised that the patient was offered psychiatric and substance abuse  treatment but the patient was indicating only that he had interest in  regaining control of his disability and does not want any other sort of  help, and he also had said that he has no suicidal ideation or any  thoughts of hurting others.  Psychiatry had no specific recommendations.  I did discuss with Dr. Electa Sniff who advised that if the patient agrees he  can take fluoxetine 20 mg p.o. daily.  At this time as the patient's  condition is stable, he has no suicidal ideation, and has no intention  to hurt others, will discharge patient home with his family to follow  with his primary care physician within a week's time and behavioral  health as an outpatient.  At the time of discharge the  patient is  hemodynamically stable.   FINAL DIAGNOSES:  1. Status post overdose of Klonopin.  2. Alcohol intoxication.  3. Polysubstance abuse.  4. Posttraumatic disorder.  5. History of posttraumatic stress disorder.   MEDICATIONS AT DISCHARGE:  Fluoxetine 20 mg p.o. daily.   PLAN:  The patient advised to follow with his primary care physician  within a week's time.  To follow with behavioral health center as an  outpatient within a week's time.  The patient advised not to abuse any  drugs or alcohol.  The patient advised no driving until cleared by his  family care physician.      Eduard Clos, MD  Electronically Signed     ANK/MEDQ  D:  01/27/2008  T:  01/27/2008  Job:  [phone]

## 2011-01-17 NOTE — Discharge Summary (Signed)
[name], [name] NO.:  [id number]   MEDICAL RECORD NO.:  [id number]          PATIENT TYPE:  INP   LOCATION:  5742                         FACILITY:  [facility]   PHYSICIAN:  Shan Levans, M.D. LHCDATE OF BIRTH:  10-20-1970   DATE OF ADMISSION:  06/03/2004  DATE OF DISCHARGE:  06/06/2004                                 DISCHARGE SUMMARY   DISCHARGE DIAGNOSES:  1.  Ventilatory-dependent respiratory failure secondary to polypharmacy      overdose.  2.  Psychiatric disorders consisting of schizophrenia, paranoid type,      chronic with acute exacerbation with cocaine inducement.  3.  Anxiety disorder with panic attacks.   HISTORY OF PRESENT ILLNESS:  [name] is a 40 year old African-American  male who was brought in by EMS because he was agitated on June 03, 2004.  He was sent for CT scan of the head and the patient developed respiratory  distress and required orotracheal intubation in the emergency department.  Subsequently the pulmonary critical care division was asked to assume his  care secondary to respiratory failure requiring mechanical ventilatory  support.   LABORATORY DATA:  Hemoglobin 14.0, hematocrit 41.6, platelet count 240,000,  white blood cell count 6.4.  Sodium 139, potassium 3.4, chloride 109, cO2  24, glucose 87, BUN 4, creatinine 0.9.  Troponin-I is less than 0.01.  CK-MB  1.9, CK 211.  Calcium is 8.1. Drug screen was positive for cocaine.  Chest x-  ray demonstrated endotracheal tube 5 cm above the carina, NG tube entering  the stomach, left lower lobe atelectasis.  CT scan of the head no acute  intracranial abnormality.   PROCEDURES:  Orotracheal intubation on June 03, 2004 with extubation on  June 04, 2004.   HOSPITAL COURSE:  PROBLEM #1:  VENTILATORY-DEPENDENT RESPIRATORY FAILURE  SECONDARY TO OVERDOSE:  The patient was admitted to [facility] and required sedation with bolus Ativan and then for a short  time  with Diprivan.  His pulmonary status improved.  He was weaned from the  ventilator, was successfully liberated from mechanical ventilatory support  by June 04, 2004.  By June 05, 2004 he was transferred to the floor.  He  had a psychiatric evaluation by Dr. Jeanie Sewer.  Of note he is followed by  [facility].  He was found to have an anxiety  disorder, paranoid schizophrenia and cocaine exacerbation of his chronic  schizophrenia.  Therefore, he will be transferred to inpatient behavioral  health center on June 06, 2004 at 3:00 P.Lesle [name] MEDICATIONS:  1.  Seroquel 50 mg q.12h.  2.  Other medications as seen fit by the psychiatric division.   DIET:  As tolerated.   CONDITION ON DISCHARGE:  His condition on discharge is improved.  He is no  longer requiring mechanical ventilatory support.  Main issue now is  psychiatric disorder coupled with substance abuse to be addressed with  inpatient counseling.      [name]   SM/MEDQ  D:  06/06/2004  T:  06/06/2004  Job:  [id number]   cc:  Felizardo Hoffmann, M.D.

## 2011-05-26 LAB — URINALYSIS, ROUTINE W REFLEX MICROSCOPIC
Nitrite: NEGATIVE
Specific Gravity, Urine: 1.025
Urobilinogen, UA: 0.2

## 2011-05-28 LAB — RAPID URINE DRUG SCREEN, HOSP PERFORMED
Amphetamines: NOT DETECTED
Benzodiazepines: NOT DETECTED

## 2011-05-28 LAB — COMPREHENSIVE METABOLIC PANEL
AST: 21
Albumin: 3.7
Chloride: 109
Creatinine, Ser: 0.93
GFR calc Af Amer: >60
Sodium: 142
Total Bilirubin: 0.7

## 2011-05-28 LAB — BASIC METABOLIC PANEL
CO2: 23
Calcium: 8 — ABNORMAL LOW
Creatinine, Ser: 0.85
Glucose, Bld: 65 — ABNORMAL LOW

## 2011-05-28 LAB — DIFFERENTIAL
Basophils Absolute: 0
Eosinophils Relative: 1
Lymphocytes Relative: 23
Lymphs Abs: 1.6
Monocytes Absolute: 0.5

## 2011-05-28 LAB — URINALYSIS, ROUTINE W REFLEX MICROSCOPIC
Leukocytes, UA: NEGATIVE
Protein, ur: NEGATIVE
Urobilinogen, UA: 0.2

## 2011-05-28 LAB — URINE MICROSCOPIC-ADD ON

## 2011-05-28 LAB — CBC
MCV: 81.9
Platelets: 249
WBC: 7

## 2011-05-28 LAB — ETHANOL: Alcohol, Ethyl (B): 114 — ABNORMAL HIGH

## 2011-05-29 LAB — POCT I-STAT, CHEM 8
Calcium, Ion: 1.07 — ABNORMAL LOW
Glucose, Bld: 105 — ABNORMAL HIGH
HCT: 47
Hemoglobin: 16

## 2011-06-12 LAB — I-STAT 8, (EC8 V) (CONVERTED LAB)
Chloride: 103
HCT: 49
pCO2, Ven: 37.4 — ABNORMAL LOW
pH, Ven: 7.412 — ABNORMAL HIGH

## 2011-06-12 LAB — POCT CARDIAC MARKERS: Troponin i, poc: <0.05

## 2011-08-15 ENCOUNTER — Inpatient Hospital Stay (HOSPITAL_COMMUNITY)
Admission: AD | Admit: 2011-08-15 | Discharge: 2011-08-20 | DRG: 897 | Disposition: A | Discharge status: Home / Self Care | Payer: Medicare Other | Source: Ambulatory Visit | Attending: Psychiatry | Admitting: Psychiatry

## 2011-08-15 ENCOUNTER — Encounter (HOSPITAL_COMMUNITY): Payer: Self-pay

## 2011-08-15 ENCOUNTER — Emergency Department (EMERGENCY_DEPARTMENT_HOSPITAL)
Admission: EM | Admit: 2011-08-15 | Discharge: 2011-08-15 | Disposition: A | Discharge status: Other Acute Inpatient Hospital | Payer: Medicare Other | Source: Home / Self Care | Attending: Emergency Medicine | Admitting: Emergency Medicine

## 2011-08-15 ENCOUNTER — Encounter: Payer: Self-pay | Admitting: *Deleted

## 2011-08-15 DIAGNOSIS — F339 Major depressive disorder, recurrent, unspecified: Secondary | ICD-10-CM

## 2011-08-15 DIAGNOSIS — F411 Generalized anxiety disorder: Secondary | ICD-10-CM

## 2011-08-15 DIAGNOSIS — F1920 Other psychoactive substance dependence, uncomplicated: Secondary | ICD-10-CM | POA: Diagnosis present

## 2011-08-15 DIAGNOSIS — I10 Essential (primary) hypertension: Secondary | ICD-10-CM

## 2011-08-15 DIAGNOSIS — F3289 Other specified depressive episodes: Secondary | ICD-10-CM

## 2011-08-15 DIAGNOSIS — Z5900 Homelessness unspecified: Secondary | ICD-10-CM

## 2011-08-15 DIAGNOSIS — F419 Anxiety disorder, unspecified: Secondary | ICD-10-CM

## 2011-08-15 DIAGNOSIS — F329 Major depressive disorder, single episode, unspecified: Secondary | ICD-10-CM

## 2011-08-15 DIAGNOSIS — F1410 Cocaine abuse, uncomplicated: Secondary | ICD-10-CM

## 2011-08-15 DIAGNOSIS — F1020 Alcohol dependence, uncomplicated: Principal | ICD-10-CM

## 2011-08-15 DIAGNOSIS — F1994 Other psychoactive substance use, unspecified with psychoactive substance-induced mood disorder: Secondary | ICD-10-CM

## 2011-08-15 DIAGNOSIS — J45909 Unspecified asthma, uncomplicated: Secondary | ICD-10-CM

## 2011-08-15 DIAGNOSIS — R45851 Suicidal ideations: Secondary | ICD-10-CM

## 2011-08-15 DIAGNOSIS — F603 Borderline personality disorder: Secondary | ICD-10-CM

## 2011-08-15 HISTORY — DX: Depression, unspecified: F32.A

## 2011-08-15 HISTORY — DX: Bipolar disorder, unspecified: F31.9

## 2011-08-15 HISTORY — DX: Essential (primary) hypertension: I10

## 2011-08-15 HISTORY — DX: Major depressive disorder, single episode, unspecified: F32.9

## 2011-08-15 HISTORY — DX: Anxiety disorder, unspecified: F41.9

## 2011-08-15 LAB — CBC
HCT: 41.3 % (ref 39.0–52.0)
Hemoglobin: 13.3 g/dL (ref 13.0–17.0)
MCH: 26.1 pg (ref 26.0–34.0)
MCV: 81.1 fL (ref 78.0–100.0)
Platelets: 249 10*3/uL (ref 150–400)
RBC: 5.09 MIL/uL (ref 4.22–5.81)

## 2011-08-15 LAB — COMPREHENSIVE METABOLIC PANEL
BUN: 7 mg/dL (ref 6–23)
CO2: 27 mEq/L (ref 19–32)
Calcium: 9 mg/dL (ref 8.4–10.5)
Creatinine, Ser: 0.88 mg/dL (ref 0.50–1.35)
GFR calc Af Amer: >90 mL/min (ref >90)
GFR calc non Af Amer: >90 mL/min (ref >90)
Glucose, Bld: 96 mg/dL (ref 70–99)

## 2011-08-15 LAB — ETHANOL: Alcohol, Ethyl (B): <11 mg/dL (ref 0–11)

## 2011-08-15 LAB — RAPID URINE DRUG SCREEN, HOSP PERFORMED
Cocaine: POSITIVE — AB
Opiates: NOT DETECTED

## 2011-08-15 MED ORDER — ALBUTEROL SULFATE HFA 108 (90 BASE) MCG/ACT IN AERS
INHALATION_SPRAY | RESPIRATORY_TRACT | Status: AC
  Administered 2011-08-15: 22:00:00
  Filled 2011-08-15: qty 6.7

## 2011-08-15 MED ORDER — MAGNESIUM HYDROXIDE 400 MG/5ML PO SUSP: 30.0000 mL | Freq: Every day | ORAL | Status: DC | PRN

## 2011-08-15 MED ORDER — RISPERIDONE 1 MG PO TABS
1.0000 mg | ORAL_TABLET | Freq: Two times a day (BID) | ORAL | Status: DC
  Administered 2011-08-15: 1 mg via ORAL
  Filled 2011-08-15: qty 1

## 2011-08-15 MED ORDER — ALBUTEROL SULFATE HFA 108 (90 BASE) MCG/ACT IN AERS
2.0000 | INHALATION_SPRAY | RESPIRATORY_TRACT | Status: DC | PRN
  Administered 2011-08-17 – 2011-08-19 (×3): 2 via RESPIRATORY_TRACT

## 2011-08-15 MED ORDER — ALUM & MAG HYDROXIDE-SIMETH 200-200-20 MG/5ML PO SUSP: 30.0000 mL | ORAL | Status: DC | PRN

## 2011-08-15 MED ORDER — RISPERIDONE 2 MG PO TABS
2.0000 mg | ORAL_TABLET | Freq: Three times a day (TID) | ORAL | Status: DC | PRN
  Administered 2011-08-15: 2 mg via ORAL
  Filled 2011-08-15: qty 1

## 2011-08-15 MED ORDER — IBUPROFEN 200 MG PO TABS: 600.0000 mg | ORAL_TABLET | Freq: Three times a day (TID) | ORAL | Status: DC | PRN

## 2011-08-15 MED ORDER — LORAZEPAM 1 MG PO TABS
1.0000 mg | ORAL_TABLET | Freq: Four times a day (QID) | ORAL | Status: DC | PRN
  Administered 2011-08-15: 1 mg via ORAL
  Filled 2011-08-15 (×2): qty 1

## 2011-08-15 MED ORDER — PAROXETINE HCL 20 MG PO TABS
40.0000 mg | ORAL_TABLET | Freq: Every day | ORAL | Status: DC
  Administered 2011-08-15: 40 mg via ORAL
  Filled 2011-08-15: qty 2

## 2011-08-15 MED ORDER — ALUM & MAG HYDROXIDE-SIMETH 200-200-20 MG/5ML PO SUSP
30.0000 mL | ORAL | Status: DC | PRN
Start: 2011-08-15 — End: 2011-08-15

## 2011-08-15 MED ORDER — NICOTINE 21 MG/24HR TD PT24: 21.0000 mg | MEDICATED_PATCH | Freq: Every day | TRANSDERMAL | Status: DC

## 2011-08-15 MED ORDER — DIPHENHYDRAMINE HCL 25 MG PO CAPS
50.0000 mg | ORAL_CAPSULE | Freq: Every evening | ORAL | Status: DC | PRN
  Administered 2011-08-15 – 2011-08-19 (×3): 50 mg via ORAL
  Filled 2011-08-15: qty 2
  Filled 2011-08-15: qty 1

## 2011-08-15 MED ORDER — ACETAMINOPHEN 325 MG PO TABS: 650.0000 mg | ORAL_TABLET | Freq: Four times a day (QID) | ORAL | Status: DC | PRN

## 2011-08-15 NOTE — ED Notes (Signed)
ACT team at bedside.  

## 2011-08-15 NOTE — Progress Notes (Signed)
40 y/o male with hx of substance abuse/depression and anxiety. Pt has also been seen for PTSD, Bipolar. Medical includes Asthma and HTN. Pt was here in April 2012. UDS positive for cocaine. Pt states he is negative for SI/HI. Pt has multiple scars all over upper and lower arms, "I used to cut several years ago." Pt is sleepy and asked if we could do the rest of the assessment later. Will report off to [place], [place]

## 2011-08-15 NOTE — Progress Notes (Signed)
Pt is a 40 year old PT admitted today for treatment for substance abuse, depression, and anxiety.   He has a past history of cutting and has numerous scars to his arms.  Pt also has past diagnosis of PTSD, Bipolar disorder.  Pt stated during admission that he has seizures with withdrawal and that he had one in the ED.  Pt was asleep when I began my shift, did get up for medication administration.  Cooperative, denied complaints at that time.  Support and encouragement provided, will continue to monitor.

## 2011-08-15 NOTE — ED Notes (Signed)
Pt reports anxiety r/t "just life in general." Will not elaborate, only mentions that he is in a program where he sees ACT team and thought he would see them 3-4x/wk but only sees them once every 3 weeks. Sts he "only wants someone to ask, "[name], how are you?"" Per previous RN, home meds not working to control anxiety. Pt has multiple scars on bil arms appearing self inflicted, intentional. No new/open lacerations. Explained plan of care, process of med clearance. Pt changed into paper scrubs. Urinal given to obtain urine sample, lab work collected. Pt verbalizes understanding and agreement.

## 2011-08-15 NOTE — ED Notes (Signed)
Pt also reports SI without plan "yet." Sts suicidal thoughts started 2 days ago. Denies HI.
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
## 2011-08-15 NOTE — ED Notes (Signed)
Acuity increased due to SI. Pt wanded and belongings searched by security. Belongings locked in cabinet in [address].

## 2011-08-15 NOTE — ED Notes (Signed)
Pt in requesting evaluation for anxiety, states he is having trouble controlling it with home medication

## 2011-08-15 NOTE — Consult Note (Signed)
Patient Identification:  [name] Date of Evaluation:  08/15/2011   History of Present Illness:  Patient seen in assessment reviewed.  40 year old male reported depressed mood and anxiety she also has a posttraumatic stress disorder symptoms like flashbacks and nightmares. He reported sleep and appetite poor feels hopeless and helpless and have suicidal ideation without specific plan. But he is very calm cooperative pleasant on approach he is not hallucinating or delusional. He is also abusing the cocaine and alcohol. His urine drug screen is positive for cocaine. The rest of the labs are within normal limits.  I also reviewed his last discharge summary has been low.   DATE OF ADMISSION: 12/04/2010  DATE OF DISCHARGE: 12/11/2010  DISCHARGE SUMMARY  IDENTIFYING DATA/REASON FOR ADMISSION: This was one of many [facility]  admissions for [name], a 40 year old single African American male, who  was admitted because of increasing mood disturbance and crisis. Please  refer to the admission note for further details pertaining to the  symptoms, circumstances, and history that led to his hospitalization.  He was given an initial Axis I diagnosis of mood disorder NOS.   MEDICAL AND LABORATORY: The patient was medically and physically  assessed by the psychiatric nurse practitioner. He came to [place] with a  history of hypertension. He was continued on his usual Norvasc. There  were no significant medical issues during his stay.   HOSPITAL COURSE: The patient was admitted to the Adult Inpatient  Psychiatric Service. He presented as an overweight, but normally-  developed [place] American male with moderately depressed mood and  anxious affect. Thoughts and speech were normally organized. There  were no signs or symptoms of psychosis or thought disorder. He was  absent of any active suicidal ideation through his entire stay. Suicide  risk assessment was completed on admission and at the time of  discharge  and he was felt to be at minimal risk.  He participated in therapeutic groups and activities geared towards  helping him acquire better coping skills, a better understanding of his  underlying disorders and dynamics, and the development of a sound  aftercare plan.  He was continued on his regiment of Paxil 40 mg daily and risperidone  0.5 mg b.i.d., and these were well tolerated.   [name] had been followed by the [facility]  treatment team. He had also been living with his parents. Both his  parents and [facility] were quite supportive of him and were in contact  with him during his stay.   By the eighth hospital day, it appeared that he had benefited maximally  from his inpatient stay. He had been absent of any active suicidal  ideation throughout. That being said, [name] is a young man who has had  chronic difficulties establishing a satisfactory pattern of gratifying  locations, past times, and activities, as well as satisfying social  relationships. This being the case, he continues to be at risk for more  or less chronic levels of depression, with poor outlook, and varying  levels of hopelessness. Fortunately, with the support available from  his family and the [facility] team, it is  hoped that he will have enough in the way of day-to-day personal contact  to maintain some forward momentum in his future living.  He was to resume contact with [facility] immediately upon discharge,which also involved returning to his parents' home.   DISCHARGE MEDICATIONS:  1. Norvasc 10 mg daily.  2. Paxil 40 mg daily.  3. Risperdal 0.5 mg b.i.d.   DISCHARGE DIAGNOSES: Axis I: Major depressive disorder, recurrent.  Axis II: Passive dependent personality traits.  Axis III: Hypertension.  Axis IV: Stressors, severe.  Axis V: Global assessment of functioning on discharge 50.     Past Medical History:     Past Medical History    Diagnosis Date  . Anxiety   . Bipolar 1 disorder   . Asthma   . Hypertension   . Depression       History reviewed. No pertinent past surgical history.  Filed Vitals:   08/15/11 0831  BP: 136/77  Pulse: 94  Temp: 98.6 F (37 C)  Resp: 20    Lab Results:   BMET    Component Value Date/Time   NA 137 08/15/2011 0740   K 3.8 08/15/2011 0740   CL 101 08/15/2011 0740   CO2 27 08/15/2011 0740   GLUCOSE 96 08/15/2011 0740   BUN 7 08/15/2011 0740   CREATININE 0.88 08/15/2011 0740   CALCIUM 9.0 08/15/2011 0740   GFRNONAA >90 08/15/2011 0740   GFRAA >90 08/15/2011 0740    Allergies: No Known Allergies  Current Medications:  Prior to Admission medications   Medication Sig Start Date End Date Taking? Authorizing Provider  carisoprodol (SOMA) 350 MG tablet Take 350 mg by mouth 3 (three) times daily as needed. Muscle pain   Yes Historical Provider, MD  cyclobenzaprine (FLEXERIL) 10 MG tablet Take 10 mg by mouth 3 (three) times daily as needed. Muscle spasm   Yes Historical Provider, MD  LORazepam (ATIVAN) 2 MG tablet Take 2 mg by mouth every 6 (six) hours as needed. Anxiety   Yes Historical Provider, MD  PARoxetine (PAXIL) 40 MG tablet Take 40 mg by mouth every morning.     Yes Historical Provider, MD  risperiDONE (RISPERDAL) 2 MG tablet Take 2 mg by mouth 2 (two) times daily.    Yes Historical Provider, MD    Social History:    does not have a smoking history on file. He does not have any smokeless tobacco history on file. He reports that he drinks alcohol. He reports that he uses illicit drugs (Marijuana).   Family History:    History reviewed. No pertinent family history.   DIAGNOSIS:   AXIS I  Maj. depressive disorder recurrent type cocaine abuse.   AXIS II  Deffered  AXIS III See medical notes.  AXIS IV  substance abuse   AXIS V 40     Recommendations: I will start the patient on Paxil 40 mg by mouth daily , Risperdal 1 mg twice a day. and will admit the  patient in the inpatient setting for further stabilization.    Eulogio Ditch, MD

## 2011-08-15 NOTE — ED Provider Notes (Addendum)
History     CSN: [id number] Arrival date & time: 08/15/2011  2:24 AM   First MD Initiated Contact with Patient 08/15/11 0451      Chief Complaint  Patient presents with  . Anxiety  Pt with h/o PTSD.  States increasing stress recently.  Admits to drinking "one beer"  And taking 3 Soma.  Admits to fleeting, transient suicidal thoughts, none now. Wants to speak with counselor.  States he is more relaxed.   (Consider location/radiation/quality/duration/timing/severity/associated sxs/prior treatment) HPI  Past Medical History  Diagnosis Date  . Anxiety   . Bipolar 1 disorder     History reviewed. No pertinent past surgical history.  History reviewed. No pertinent family history.  History  Substance Use Topics  . Smoking status: Not on file  . Smokeless tobacco: Not on file  . Alcohol Use:       Review of Systems  All other systems reviewed and are negative.    Allergies  Review of patient's allergies indicates no known allergies.  Home Medications   Current Outpatient Rx  Name Route Sig Dispense Refill  . SOMA PO Oral Take by mouth.      Marland Kitchen FLEXERIL PO Oral Take by mouth.      . ATIVAN PO Oral Take by mouth.      Marland Kitchen PAXIL PO Oral Take by mouth.      Marland Kitchen RISPERDAL PO Oral Take by mouth.        BP 144/80  Pulse 80  Temp(Src) 98 F (36.7 C) (Oral)  Resp 20  SpO2 100%  Physical Exam  Nursing note and vitals reviewed. Constitutional: He appears well-developed and well-nourished. No distress.  HENT:  Head: Normocephalic.  Eyes: Pupils are equal, round, and reactive to light.  Cardiovascular: Normal heart sounds.   Pulmonary/Chest: Breath sounds normal.  Abdominal: Soft.  Musculoskeletal: Normal range of motion.  Neurological: He is alert.  Skin: Skin is warm and dry.    ED Course  Procedures (including critical care time)  Labs Reviewed - No data to display No results found.   No diagnosis found.    MDM  Pt is seen and examined;  Initial  history and physical completed.  Will follow.          Leveon Pelzer A. Patrica Duel, MD 08/15/11 [phone]  Will discuss with ACT  team counselor. Disposition will will be pending evaluation  Tineshia Becraft A. Patrica Duel, MD 08/15/11 1191  Lorelle Gibbs. Patrica Duel, MD 09/28/11 4782  Lorelle Gibbs. Patrica Duel, MD 09/28/11 [phone]

## 2011-08-15 NOTE — ED Notes (Signed)
Ordered pt breakfast tray per RN with service response

## 2011-08-15 NOTE — Progress Notes (Signed)
Patient ID: [name], male   DOB: 04-01-1971, 40 y.o.   MRN: [id number]

## 2011-08-16 DIAGNOSIS — F1920 Other psychoactive substance dependence, uncomplicated: Secondary | ICD-10-CM | POA: Diagnosis present

## 2011-08-16 DIAGNOSIS — F1994 Other psychoactive substance use, unspecified with psychoactive substance-induced mood disorder: Secondary | ICD-10-CM

## 2011-08-16 MED ORDER — ARIPIPRAZOLE 5 MG PO TABS
5.0000 mg | ORAL_TABLET | Freq: Every day | ORAL | Status: DC
  Administered 2011-08-16 – 2011-08-20 (×5): 5 mg via ORAL
  Filled 2011-08-16 (×5): qty 1

## 2011-08-16 MED ORDER — CITALOPRAM HYDROBROMIDE 10 MG PO TABS
10.0000 mg | ORAL_TABLET | Freq: Two times a day (BID) | ORAL | Status: DC
  Administered 2011-08-16 – 2011-08-20 (×9): 10 mg via ORAL
  Filled 2011-08-16 (×10): qty 1

## 2011-08-16 NOTE — Progress Notes (Signed)
Patient ID: [name], male   DOB: 01-07-1971, 40 y.o.   MRN: [id number] 08-16-11 nursing shift note: pt has been visible in the milieu. Taking meds, going to groups and meals. He has been very polite/cooperative. On his inventory sheet he wrote sleep poor, appetite poor, energy level low and attention is poor. He rated his depression and hopelessness at 9. Withdrawal symptoms are tremors, agitation, sedation and chilling. He states his suicidal thoughts are constant but he was able to contract. His physical problems are blurred vision. He rated pain at 9 but has not complained about any pain. His inventory sheet is incomplete. RN will monitor and safety checks continue every 15 minutes.

## 2011-08-16 NOTE — Progress Notes (Signed)
Suicide Risk Assessment  Admission Assessment     Demographic factors:  Assessment Details Time of Assessment: Admission Information Obtained From: Patient Current Mental Status:  Current Mental Status: Self-harm thoughts Loss Factors:  Loss Factors: Financial problems / change in socioeconomic status Historical Factors:  Historical Factors: Prior suicide attempts Risk Reduction Factors:  Risk Reduction Factors: Sense of responsibility to family  CLINICAL FACTORS:   Severe Anxiety and/or Agitation Depression:   Anhedonia Hopelessness Insomnia Severe Alcohol/Substance Abuse/Dependencies More than one psychiatric diagnosis Previous Psychiatric Diagnoses and Treatments  COGNITIVE FEATURES THAT CONTRIBUTE TO RISK:  No Cognitive Impairment.  Diagnosis:  Axis I: Substance Induced Mood Disorder. Alcohol Dependence Cocaine Abuse   The patient was seen today and reports the following:   Sleep: The patient reports to having difficulty sleeping.  Appetite: Decreased.   Mild>(1-10) >Severe  Hopelessness (1-10): 9  Depression (1-10): 8  Anxiety (1-10): 8   Suicidal Ideation: The patient reports ongoing suicidal ideations today but with no plan or intent.  Plan: No  Intent: No  Means: No   Homicidal Ideation: The patient adamantly denies any homicidal ideations today.  Plan: No  Intent: No.  Means: No   Eye Contact: Good.  General Appearance [name]: Neat and Casual but significantly anxious and depressed. Motor Behavior: Normal  Speech: WNL  Mental Status: AO x 3  Level of Consciousness: Alert  Mood: Severely Depressed.  Affect: Moderately Constricted.  Anxiety: Severe.  Thought Process: Coherent.  Thought Content: WNL. No current auditory or visual hallucinations or delusional thinking reported.  Perception: Normal  Judgment: Fair.  Insight: Fair.  Cognition: Orientation time, place and person.   Treatment Plan Summary:  1. Daily contact with patient to assess  and evaluate symptoms and progress in treatment  2. Medication management  3. The patient will deny suicidal ideations or homicidal ideations for 48 hours prior to discharge and have a depression and anxiety rating of 3 or less. The patient will also deny any auditory or visual hallucinations or delusional thinking or display any manic or hypomanic behaviors.  The patient will also display no symptoms of substance withdrawal.  Plan:  1. Will continue current medications.  2. Will continue to monitor.  3. Will place on a Librium Protocol for detox from alcohol.  SUICIDE RISK:   Mild:  Suicidal ideation of limited frequency, intensity, duration, and specificity.  There are no identifiable plans, no associated intent, mild dysphoria and related symptoms, good self-control (both objective and subjective assessment), few other risk factors, and identifiable protective factors, including available and accessible social support.   [name] 08/16/2011, 10:20 AM

## 2011-08-16 NOTE — Progress Notes (Signed)
Patient ID: [name], male   DOB: Feb 23, 1971, 40 y.o.   MRN: [id number]   [facility] Group Notes:  (Counselor/Nursing/MHT/Case Management/Adjunct)  08/16/2011 1:15 PM  Type of Therapy:  Group Therapy, Dance/Movement Therapy   Participation Level:  Did Not Attend  Kym Groom

## 2011-08-16 NOTE — H&P (Signed)
Psychiatric Admission Assessment Adult  Patient Identification:  [name] Date of Evaluation:  08/16/2011 40 yo SAAM History of Present Illness::  Presents to ED c/o increased anxiety. Was +cocaine and reports having had one beer. No measurable alcohol. Is homeless again having to leave his parents home again.    Past Psychiatric History: One of many admissions here and elsewhere for above.     Substance Abuse History:  Social History:    reports that he has never smoked. He has never used smokeless tobacco. He reports that he drinks about 1.8 ounces of alcohol per week. He reports that he uses illicit drugs (Marijuana). UDS+cocaine and has been documented for same in past. Some alcohol use.Has never married no children receives SSI. Would like help finding housing.    Family Psych History:Parents and grandparents abused drug accodring to patient.  Past Medical History:     Past Medical History  Diagnosis Date  . Anxiety   . Bipolar 1 disorder   . Asthma   . Hypertension   . Depression       No past surgical history on file.  Allergies: No Known Allergies  Current Medications:  Prior to Admission medications   Not on File    Mental Status Examination/Evaluation: Objective:  Appearance: Fairly Groomed many old self inflicted lacerations forearms   Psychomotor Activity:  Normal  Eye Contact::  Good  Speech:  Clear and Coherent  Volume:  Normal  Mood: calm reports anxiety  And feels hopeless   Affect:  Non-Congruent appears calm   Thought Process:  Reports AH usually when using drugs   Orientation:  Full  Thought Content:  Hallucinations: Auditory less now that cocaine has worn off   Suicidal Thoughts:  Yes.  with intent/plan  Homicidal Thoughts:  No  Judgement:  Impaired  Insight:  Shallow    DIAGNOSIS:    AXIS I Substance Induced Mood Disorder  AXIS II Borderline Personality Dis.  AXIS III See medical history.  AXIS IV economic problems, educational  problems, housing problems, occupational problems and problems with primary support group  AXIS V 41-50 serious symptoms     Treatment Plan Summary:  Admit for safety and stabilization. Start Celexa for depression and anxiety          Abilify to help with AH and augment Celexa Case manager to help identify long term SA treatment program to help break cycle of relapse and address his homelessness. Agree with H&P from ED.  Mickie deery [employer]

## 2011-08-16 NOTE — Progress Notes (Signed)
Patient ID: [name], male   DOB: [birth year], 40 y.o.   MRN: [id number]  Pt. attended and participated in aftercare planning group. Pt. accepted information on suicide prevention, warning signs to look for with suicide and crisis line numbers to use. The pt. agreed to call crisis line numbers if having warning signs or having thoughts of suicide. Pt. listed their current anxiety level as a nine because he just arrived here.

## 2011-08-16 NOTE — Progress Notes (Signed)
[facility] Adult Inpatient Family/Significant Other Suicide Prevention Education  Suicide Prevention Education:  Patient Refusal for Family/Significant Other Suicide Prevention Education: The patient [name] has refused to provide written consent for family/significant other to be provided Family/Significant Other Suicide Prevention Education during admission and/or prior to discharge.  Physician notified.  Pt. attended and participated in aftercare planning group. Pt. accepted information on suicide prevention, warning signs to look for with suicide and crisis line numbers to use. The pt. agreed to call crisis line numbers if having warning signs or having thoughts of suicide.   Teffany Blaszczyk 08/16/2011, 4:44 PM

## 2011-08-16 NOTE — Progress Notes (Signed)
Pt was in room resting with eyes closed for durations of shift.  Woke for vital signs, then went back to sleep.  Was cooperative, pleasant upon awakening.  No complaints voiced.  Will continue to monitor.

## 2011-08-17 DIAGNOSIS — F411 Generalized anxiety disorder: Secondary | ICD-10-CM

## 2011-08-17 DIAGNOSIS — F3130 Bipolar disorder, current episode depressed, mild or moderate severity, unspecified: Secondary | ICD-10-CM

## 2011-08-17 MED ORDER — CHLORDIAZEPOXIDE HCL 25 MG PO CAPS
25.0000 mg | ORAL_CAPSULE | Freq: Four times a day (QID) | ORAL | Status: AC | PRN
  Administered 2011-08-19: 25 mg via ORAL

## 2011-08-17 MED ORDER — LOPERAMIDE HCL 2 MG PO CAPS: 2.0000 mg | ORAL_CAPSULE | ORAL | Status: AC | PRN

## 2011-08-17 MED ORDER — ONDANSETRON 4 MG PO TBDP: 4.0000 mg | ORAL_TABLET | Freq: Four times a day (QID) | ORAL | Status: AC | PRN

## 2011-08-17 MED ORDER — CHLORDIAZEPOXIDE HCL 25 MG PO CAPS
25.0000 mg | ORAL_CAPSULE | ORAL | Status: DC
  Administered 2011-08-20: 25 mg via ORAL

## 2011-08-17 MED ORDER — CHLORDIAZEPOXIDE HCL 25 MG PO CAPS
25.0000 mg | ORAL_CAPSULE | Freq: Four times a day (QID) | ORAL | Status: AC
  Administered 2011-08-17 – 2011-08-18 (×5): 25 mg via ORAL
  Filled 2011-08-17 (×5): qty 1

## 2011-08-17 MED ORDER — CHLORDIAZEPOXIDE HCL 25 MG PO CAPS: 25.0000 mg | ORAL_CAPSULE | Freq: Every day | ORAL | Status: DC

## 2011-08-17 MED ORDER — CHLORDIAZEPOXIDE HCL 25 MG PO CAPS
25.0000 mg | ORAL_CAPSULE | Freq: Three times a day (TID) | ORAL | Status: AC
  Administered 2011-08-19 (×2): 25 mg via ORAL
  Filled 2011-08-17 (×4): qty 1

## 2011-08-17 MED ORDER — CHLORDIAZEPOXIDE HCL 25 MG PO CAPS: 50.0000 mg | ORAL_CAPSULE | Freq: Once | ORAL | Status: DC

## 2011-08-17 MED ORDER — THERA M PLUS PO TABS
1.0000 | ORAL_TABLET | Freq: Every day | ORAL | Status: DC
  Administered 2011-08-17 – 2011-08-20 (×4): 1 via ORAL
  Filled 2011-08-17 (×5): qty 1

## 2011-08-17 MED ORDER — VITAMIN B-1 100 MG PO TABS
100.0000 mg | ORAL_TABLET | Freq: Every day | ORAL | Status: DC
  Administered 2011-08-18 – 2011-08-20 (×3): 100 mg via ORAL
  Filled 2011-08-17 (×4): qty 1

## 2011-08-17 MED ORDER — THIAMINE HCL 100 MG/ML IJ SOLN
100.0000 mg | Freq: Once | INTRAMUSCULAR | Status: AC
  Administered 2011-08-17: 100 mg via INTRAMUSCULAR

## 2011-08-17 MED ORDER — CHLORDIAZEPOXIDE HCL 25 MG PO CAPS
50.0000 mg | ORAL_CAPSULE | Freq: Once | ORAL | Status: AC
  Administered 2011-08-17: 50 mg via ORAL

## 2011-08-17 MED ORDER — CHLORDIAZEPOXIDE HCL 25 MG PO CAPS
ORAL_CAPSULE | ORAL | Status: AC
  Administered 2011-08-17: 50 mg via ORAL
  Filled 2011-08-17: qty 2

## 2011-08-17 MED ORDER — HYDROXYZINE HCL 25 MG PO TABS: 25.0000 mg | ORAL_TABLET | Freq: Four times a day (QID) | ORAL | Status: AC | PRN

## 2011-08-17 NOTE — Progress Notes (Signed)
Pt has been in bed due to withdrawal symptoms earlier, up this evening eating snack.  Flat affect but pleasant on approach.  Denies complaints at this time, took scheduled medication without incident.  Denies SI/HI/hallucinations.  Support and encouragement offered, will continue to monitor.

## 2011-08-17 NOTE — Progress Notes (Signed)
[name] is a 40 y.o. male [id number] [birth year]   Diagnosis:  Axis I: Alcohol Abuse                               Cocaine abuse  Vital Signs:Blood pressure 148/118, pulse 102, temperature 97.9 F (36.6 C), temperature source Oral, resp. rate 20.  Subjective/Objective: pt. States he is feeling rough this morning, and was drinking 6-7 glasses of wine, liquor, or malt liquor a day prior to coming in. His BAL was less than 5.  He has a hx of seizures when he withdraws from alcohol.  He notes he has shakes this morning and not much appetite. He is also diaphoretic and sensitive to light.   Mental Status: Pt is up and active in the unit milieu. General Appearance: Disheveled Behavior: Cooperative, guarded, uncooperative Eye Contact:  Fair Motor Behavior:  Restlestness and Tremor Speech:  Regular rate and rhythm, normal volume, coherent and goal directed. Level of Consciousness:  Alert Mood:  Irritable Affect:  Constricted Anxiety Level:  Moderate Thought Process:  Linear and organized. Thought Content:  No auditory or visual hallucinations. No evidence of psychosis. States + SI/ than he had upon admission but it is still present.  He voices no plans, means or intent. Perception:  Normal Judgment:  Poor Insight:  Absent Cognition:  Orientation time, place and person Sleep:  Number of Hours: 5.75  Appetite: none, improving, as usual, improved  Lab Results:   Medications Scheduled:     . ARIPiprazole  5 mg Oral Daily  . citalopram  10 mg Oral BID     PRN Meds acetaminophen, albuterol, alum & mag hydroxide-simeth, diphenhydrAMINE, LORazepam, magnesium hydroxide  Assessment/Plan: Given the patient's elevated Bp, increased pulse, diaphoresis and tremors a librium protocol will be started now with a 50mg  loading dose immediately.   Anticipated D/C is 3-5 days.  Rona Ravens. Siya Flurry [facility] 08/17/2011

## 2011-08-17 NOTE — Progress Notes (Signed)
Patient ID: [name], male   DOB: 10-08-1970, 40 y.o.   MRN: [id number]   [facility] Group Notes:  (Counselor/Nursing/MHT/Case Management/Adjunct)  08/17/2011 1:15 PM  Type of Therapy:  Group Therapy, Dance/Movement Therapy   Participation Level:  Did Not Attend  Kym Groom

## 2011-08-17 NOTE — Progress Notes (Addendum)
Patient ID: MAUDE GLOOR, male   DOB: 06/14/1971, 40 y.o.   MRN: [id number] 08-17-11 nursing shift note: pt has not felt well today. Pa assessed pt and put him on the librium protocol. He is having withdrawal symptoms of sweating, anxiety and mild tremor.  His ciwa was a 7. Pt has been very isolative today and not interacting in the milieu. He is taking his medication and was able to contract for si/hi and denied any a/v. On his inventory sheet he wrote slept poor, appetite poor, energy low, attention poor with depression and hopelessness at 8. He sheet is incomplete.   Pt stated he was having some withdrawal. RN will continue to monitor and safety checks continue every 15 minutes.

## 2011-08-18 NOTE — Progress Notes (Signed)
Patient ID: [name], male   DOB: 04/04/1971, 40 y.o.   MRN: [id number] Nursing       [name] is medication compliant and participating in Group treatment. He rates his Depression and Hopelessness as 8/10.He states he is entertaining Suicidal thoughts off and on but contracts for safety  at  this time.Encouraged and supported.

## 2011-08-18 NOTE — Progress Notes (Signed)
[facility] Group Notes:  (Counselor/Nursing/MHT/Case Management/Adjunct)  08/18/2011 12:36 PM   Type of Therapy:  Processing Group at 11:00 am  Participation Level: minimal  Participation Quality:  Appropriate  Affect:  Appropriate  Cognitive:  Appropriate  Insight:  Good  Engagement in Group:  Good  Engagement in Therapy:  Good  Modes of Intervention:  Clarification, exploration, socialization and support.  Summary of Progress/Problems: [name] shared the lack of a support system  has been, and he expects will be his greatest challenge, in addition to his own thinking. [name] has experienced a period of 5 months and an additional period of 2 months of clean time since last being here. Members of group provided support and encouragement.    [facility] Group Notes:  (Counselor/Nursing/MHT/Case Management/Adjunct)  08/18/2011 3:15 PM   Type of Therapy:  Counseling Group at 1:15 pm  Participation Level:  Did not attend    [name], LCSWA 08/18/2011 3:15 PM

## 2011-08-18 NOTE — Treatment Plan (Signed)
Interdisciplinary Treatment Plan Update (Adult)  Date: 08/18/2011  Time Reviewed: 8:21 AM   Progress in Treatment: Attending groups: Yes Participating in groups: Yes Taking medication as prescribed: Yes Tolerating medication: Yes   Family/Significant othe contact made: Counselor to contact  Patient understands diagnosis:  Yes As  evidenced by talking about his need to be stabilized on medication for mental health issues as well as his recent relapse on alcohol and cocaine Discussing patient identified problems/goals with staff:  Yes  See Below Medical problems stabilized or resolved:  Yes Denies suicidal/homicidal ideation: Yes Issues/concerns per patient self-inventory:  No Not turned in today Other:  New problem(s) identified: N/A  Reason for Continuation of Hospitalization: Depression Medication stabilization Withdrawal symptoms  Interventions implemented related to continuation of hospitalization: Work with [name] to identify symptoms and possible medication intervention, then monitor for side effects and effectiveness.  Encourage group attendance and participation  Additional comments:  Estimated length of stay:2-4 days  Discharge Plan:Return home  Follow up with [facility] ACT team  New goal(s): N/A  Review of initial/current patient goals per problem list:   1.  Goal(s):Stabilize on medications  Met:  No  Target date:12/20  As evidenced by: [name] will have no complaints about mental health symptoms identified at admission  2.  Goal (s):Identify comprehensive sobriety plan  Met:  No  Target date:08/21/11  As evidenced ZO:XWRUE will commit to a plan that includes getting support from others  3.  Goal(s):Communication/coordination with important people not at the hospital  Met:  No  Target date:12/19  As evidenced AV:[id number] and case manager will reach out family and ACT team  4.  Goal(s):  Met:  No  Target date:  As evidenced  by:  Attendees: Patient:  [name] 08/18/2011 8:21 AM  Family:     Physician:  Lupe Carney 08/18/2011 8:21 AM   Nursing: Marney Setting   08/18/2011 8:21 AM   Case Manager:  Richelle Ito, LCSW 08/18/2011 8:21 AM   Counselor:  [name], LCSWA 08/18/2011 8:21 AM   Other:  Verne Spurr PA 08/18/2011 8:21 AM  Other:     Other:     Other:      Scribe for Treatment Team:   Daryel Gerald B, 08/18/2011 8:21 AM

## 2011-08-18 NOTE — Progress Notes (Addendum)
[name] is a 40 y.o. male [id number] [birth year]   Diagnosis:  Axis I: Substance induced mood disorder  Vital Signs:Blood pressure 134/89, pulse 96, temperature 97.8 F (36.6 C), temperature source Oral, resp. rate 24, SpO2 97.00%.  Subjective/Objective:  [name] was up and seen in the treatment team meeting for evaluation and planning of the goals he would like to achieve with this admission.  He states he feels better today after the start of a librium taper.  He notes that the Abilify he was started on has helped as well.  Tiras states he would like help getting his ACT team to help him better.  Mental Status: Pt is up and active in the unit milieu. General Appearance: Casual Behavior: Cooperative, guarded, uncooperative Eye Contact:  Fair Motor Behavior:  Restlestness Speech:  Regular rate and rhythm, normal volume, coherent and goal directed. Level of Consciousness:  Alert Mood:  Depressed Affect:  Appropriate Anxiety Level:  Minimal Thought Process:  Linear and organized. Thought Content:  No auditory or visual hallucinations. No evidence of psychosis. Perception:  Normal Judgment:  Fair Insight:  Absent Cognition:  Orientation time, place and person Sleep:  Number of Hours: 5.25  Appetite: none, improving, as usual, improved  Lab Results: None since admission:   Medications Scheduled:     . ARIPiprazole  5 mg Oral Daily  . chlordiazePOXIDE  25 mg Oral QID   Followed by  . chlordiazePOXIDE  25 mg Oral TID   Followed by  . chlordiazePOXIDE  25 mg Oral BH-qamhs   Followed by  . chlordiazePOXIDE  25 mg Oral Daily  . chlordiazePOXIDE  50 mg Oral Once  . citalopram  10 mg Oral BID  . multivitamins ther. w/minerals  1 tablet Oral Daily  . thiamine  100 mg Intramuscular Once  . thiamine  100 mg Oral Daily  . DISCONTD: chlordiazePOXIDE  50 mg Oral Once     PRN Meds acetaminophen, albuterol, alum & mag hydroxide-simeth, chlordiazePOXIDE, diphenhydrAMINE,  hydrOXYzine, loperamide, magnesium hydroxide, ondansetron, DISCONTD: LORazepam  Assessment/Plan: Continue current plan of care with no changes at this time.  Anticipated D/C is 2-4 days.  Rona Ravens. [employer] 08/18/2011

## 2011-08-18 NOTE — Discharge Planning (Addendum)
[name] attended AM group, good participation.  States he is having some challenges with his ACT team and would like support in addressing them.  CM will contact them today to set up a meeting if possible.  [name] also admitted to relapsing on alcohol and cocaine, and is feeling remorse about that.  He is open to putting together a sobriety plan that is more comprehensive than will power alone. Per State Regulation 482.30 This chart was reviewed for medical necessity with respect to the patient's Admission/Duration of stay. [name], [place]  08/18/2011  Next Review Date:  08/21/11

## 2011-08-18 NOTE — Progress Notes (Signed)
Pt in bed at this time and easily awakened.  He has no complaints at this time, but says he is in bed because he is tired.  When asked about group, he says he did not feel like going tonight, but says he has been to others today.  Pt is on Librium protocol for ETOH and cocaine abuse.  He denies any withdrawal symptoms at this time.  Safety maintained with q15 minute checks.

## 2011-08-19 DIAGNOSIS — F10239 Alcohol dependence with withdrawal, unspecified: Secondary | ICD-10-CM

## 2011-08-19 DIAGNOSIS — F1010 Alcohol abuse, uncomplicated: Secondary | ICD-10-CM

## 2011-08-19 DIAGNOSIS — F1420 Cocaine dependence, uncomplicated: Secondary | ICD-10-CM

## 2011-08-19 NOTE — Progress Notes (Signed)
[facility] MD Progress Note  08/19/2011 8:50 PM  S/O: Pt in continued withdrawal.  Tolerating librium taper well.  No acute issues reported.  Pt was laying in bed and refused to get up to talk because he just received a dose of librium.    Sleep:  Number of Hours: 5.25   Vital Signs:Blood pressure 143/109, pulse 101, temperature 97.7 F (36.5 C), temperature source Oral, resp. rate 18, SpO2 97.00%.  Lab Results: No results found for this or any previous visit (from the past 48 hour(s)).  Physical Findings: CIWA:  CIWA-Ar Total: 2   Meds:   . ARIPiprazole  5 mg Oral Daily  . chlordiazePOXIDE  25 mg Oral QID   Followed by  . chlordiazePOXIDE  25 mg Oral TID   Followed by  . chlordiazePOXIDE  25 mg Oral BH-qamhs   Followed by  . chlordiazePOXIDE  25 mg Oral Daily  . citalopram  10 mg Oral BID  . multivitamins ther. w/minerals  1 tablet Oral Daily  . thiamine  100 mg Oral Daily    A/Plan: Continue current meds.  Pt agreeable with plan.  Lupe Carney 08/19/2011, 8:50 PM

## 2011-08-19 NOTE — Discharge Planning (Signed)
Pt attended group today.  Reminded me he wanted me to call his father.  Attempted to get clarity about his need for call, but unable to do so.  Agreed to call with him later.  Let him know [name], member of ACT team, is coming to meet today with [place].  Responded positively to this news.  Met with [name] at 11:00.  They have been working a long time together.  Both agreed that [employer] many things that are said to him, and never checks it out, which causes many problems and often leads to relapse.  [name] also admitted that he is anxious that his parents will not let him come home, though they have said nothing that would indicate that is what they are planning.  [name] too believes they will welcome him home.  [name] and I called the house and left a message for his parents to call back.  [name] denies depression or SI.  States he will be ready to release when he gets clarification from his parents.

## 2011-08-19 NOTE — Progress Notes (Signed)
Slept poorly last nite, appetite is improving, energy level is hyper and ability to pay attention is poor, depressed 8/10 and hopelessness 8/10, contracts for safety, denies SI q67min safety checks continues and support offered Safety maintained

## 2011-08-19 NOTE — Progress Notes (Signed)
[facility] Group Notes:  (Counselor/Nursing/MHT/Case Management/Adjunct)  08/19/2011 3:34 PM   Type of Therapy:  Processing Group at 11:00 am  Participation Level:  Minimal  Participation Quality:  Appropriate  Affect:  Appropriate  Cognitive:  Appropriate  Insight:  Good  Engagement in Group:  Good  Engagement in Therapy:  Good  Modes of Intervention:  Clarification and exploration  Summary of Progress/Problems:  [name]'s shared feelings of humiliation in relationship to his diagnosis of substance dependence. When asked to reframe his thought of humiliation to develop a gratitude for surviving relapse [name] visibly brightened. [name] was open and in sharing how much improved his 5 month period and also the two-month period of sobriety was;  stating "it was the best time of my life."  [facility] Group Notes:  (Counselor/Nursing/MHT/Case Management/Adjunct)  08/19/2011 3:34 PM   Type of Therapy:  Counseling Group at 1:15 pm  Participation Level:    Participation Quality:  [facility] Group Notes:  (Counselor/Nursing/MHT/Case Management/Adjunct)  08/19/2011 3:34 PM   Type of Therapy:  Processing Group at 11:00 am  Participation Level:  Minimal  Participation Quality:  Attentive sharing yet quiet  Affect:  Depressed except when sharing  Cognitive:  Appropriate  Insight:  Good  Engagement in Group:  Good  Engagement in Therapy:  Very good  Modes of Intervention:  Exploration and education  Summary of Progress/Problems:  Group session centered on feelings of shame; pt shared that "sometimes it is easier for me to feel shame and guilt  Verses feelings of value." [name] was very attentive to educational segment and added good information about early sobriety for others in group.  08/19/2011 Selenne Coggin, Julious Payer 3:40 PM

## 2011-08-20 MED ORDER — CITALOPRAM HYDROBROMIDE 10 MG PO TABS: 10.0000 mg | ORAL_TABLET | Freq: Two times a day (BID) | ORAL | Status: DC

## 2011-08-20 MED ORDER — ARIPIPRAZOLE 5 MG PO TABS: 5.0000 mg | ORAL_TABLET | Freq: Every day | ORAL | Status: AC

## 2011-08-20 NOTE — Progress Notes (Signed)
[facility] Group Notes:  (Counselor/Nursing/MHT/Case Management/Adjunct)  08/20/2011 2:23 PM   Type of Therapy:  Processing Group at 11:00 am  Participation Level:  Active  Participation Quality:  Appropriate  Affect:  Appropriate  Cognitive:  Appropriate  Insight:  Limited  Engagement in Group:  Good  Engagement in Therapy:  Good  Modes of Intervention:  Education and exploration  Summary: [name] was attentive to discussion of family patterns and how we communicate and trust with one another. [name] shared his happiest memories from childhood related to "[employer]" [name] was engaged in singing willing to consider working with a sponsor after discharge. [name] is aware of his tendency to jump to conclusions often does not work in his best interest.   [name], LCSWA 08/20/2011 2:23 PM

## 2011-08-20 NOTE — Progress Notes (Signed)
[facility] Case Management Discharge Plan:  Will you be returning to the same living situation after discharge: Yes,  Home Would you like a referral for services when you are discharged:No. Do you have access to transportation at discharge:No. Do you have the ability to pay for your medications:Yes,  Provided by ACT team  Interagency Information:     Patient to Follow up at:  Follow-up Information    Follow up with [facility] ACT team on 08/20/2011. Clemetine Marker will see you today)    Contact information:   [address]  [place], [place] [address] [phone]         Patient denies SI/HI:   Yes,  Yes    Safety Planning and Suicide Prevention discussed:  Yes,  Counselor  Barrier to discharge identified:No.  Summary and Recommendations:   [name] 08/20/2011, 11:02 AM

## 2011-08-20 NOTE — Progress Notes (Signed)
Pt in bed with eyes closed.  No distress observed.  Safety maintained with q15 minute checks.

## 2011-08-20 NOTE — Progress Notes (Signed)
Suicide Risk Assessment  Discharge Assessment     Demographic factors: Assessment Details   Current Mental Status: Patient seen and evaluated. Chart reviewed. Patient stated that his mood was "good and the medications were helping". His affect was mood congruent and euthymic. He denied any current thoughts of self injurious behavior, suicidal ideation or homicidal ideation. He denied any significant depressive signs or symptoms at this time. There were no auditory or visual hallucinations, paranoia, delusional thought processes, or mania noted.  Thought process was linear and goal directed.  No psychomotor agitation or retardation was noted. His speech was normal rate, tone and volume. Eye contact was good. Judgment and insight are fair.  Patient has been up and engaged on the unit.  No safety concerns reported from team.  Loss Factors: Financial problems / change in socioeconomic status   Historical Factors: Prior suicide attempts and hx cutting as a teen  Risk Reduction Factors: Sense of responsibility to family; insight into need for Tx and meds   CLINICAL FACTORS:  Anxiety   Depression Alcohol/Substance Abuse/Dependencies  Previous Psychiatric Diagnoses and Treatments   COGNITIVE FEATURES THAT CONTRIBUTE TO RISK: none.  Diagnoses: Alcohol Dependence; Cocaine Abuse; r/o PTSD   Meds:   . ARIPiprazole  5 mg Oral Daily  . chlordiazePOXIDE  25 mg Oral TID   Followed by  . chlordiazePOXIDE  25 mg Oral BH-qamhs   Followed by  . chlordiazePOXIDE  25 mg Oral Daily  . citalopram  10 mg Oral BID  . multivitamins ther. w/minerals  1 tablet Oral Daily  . thiamine  100 mg Oral Daily    VS: Filed Vitals:   08/20/11 0701  BP: 138/92  Pulse: 90  Temp:   Resp:     Suicide risk: Pt viewed as a chronic increased risk of harm to self in light of his past hx and risk factors.  No acute safety concerns on the unit throughout admission.  Pt contracting for safety and in need of discharge so  he can join his family today on their trip to [facility] for the holiday.  A/P:  VSS; f/u with ACT team. Home with family for trip to [facility] for the holidays. Taper not completed, but pt in need to be discharged today with family.  Medication education completed.  Pros, cons, risks, potential side effects and benefits (including no treatment) were discussed with pt.  Pt stable for and requesting discharge. Pt contracting for safety and does not currently meet [facility] involuntary commitment criteria for continued hospitalization.  Mental health treatment, medication management and continued sobriety will mitigate against the increased risk of harm to self and/or others.  Discussed the importance of recovery further with pt, as well as, tools to move forward in a healthy & safe manner.  Pt agreeable with the plan.  Discussed with the team.  Please see orders, follow up plans per team and full discharge summary completed by physician extender.   Lupe Carney 08/20/2011, 10:32 AM

## 2011-08-20 NOTE — Progress Notes (Addendum)
Patient ID: [name], male   DOB: 1970/10/25, 40 y.o.   MRN: [id number] Nursing     Pt.is awaiting Discharge today.He denies S/I,H/I and A/V hallucinations.Encouraged and supported.He will be given Sample Medications.Pt.discharged at 1230.

## 2011-08-21 NOTE — Progress Notes (Signed)
Patient Discharge Instructions:  Admission Note Faxed,  08/21/2011 After Visit Summary Faxed,  08/21/2011 Faxed to the Next Level Care provider:  08/21/2011 Facesheet faxed 08/21/2011  Faxed to [phone]  Wandra Scot, 08/21/2011, 3:22 PM

## 2011-09-06 ENCOUNTER — Encounter (HOSPITAL_COMMUNITY): Payer: Self-pay | Admitting: *Deleted

## 2011-09-06 ENCOUNTER — Emergency Department (HOSPITAL_COMMUNITY)
Admission: EM | Admit: 2011-09-06 | Discharge: 2011-09-07 | Disposition: A | Discharge status: Other Acute Inpatient Hospital | Payer: Medicare Other | Source: Home / Self Care | Attending: Emergency Medicine | Admitting: Emergency Medicine

## 2011-09-06 DIAGNOSIS — F329 Major depressive disorder, single episode, unspecified: Secondary | ICD-10-CM

## 2011-09-06 DIAGNOSIS — I10 Essential (primary) hypertension: Secondary | ICD-10-CM | POA: Insufficient documentation

## 2011-09-06 DIAGNOSIS — F3289 Other specified depressive episodes: Secondary | ICD-10-CM | POA: Insufficient documentation

## 2011-09-06 DIAGNOSIS — R079 Chest pain, unspecified: Secondary | ICD-10-CM | POA: Insufficient documentation

## 2011-09-06 DIAGNOSIS — R0602 Shortness of breath: Secondary | ICD-10-CM | POA: Insufficient documentation

## 2011-09-06 LAB — CBC
HCT: 40.5 % (ref 39.0–52.0)
MCHC: 32.8 g/dL (ref 30.0–36.0)
Platelets: 289 10*3/uL (ref 150–400)
RDW: 14.6 % (ref 11.5–15.5)
WBC: 10.2 10*3/uL (ref 4.0–10.5)

## 2011-09-06 NOTE — ED Notes (Signed)
Per EMS:  Pt comes from home and states that he has a hx of depression with additional stress as of late.  Pt has been contimplating SI tonight so he called EMS.  Pt likes to self medicate for depression with cocaine which he used tonight.

## 2011-09-07 ENCOUNTER — Other Ambulatory Visit: Payer: Self-pay

## 2011-09-07 ENCOUNTER — Inpatient Hospital Stay (HOSPITAL_COMMUNITY)
Admission: RE | Admit: 2011-09-07 | Discharge: 2011-09-11 | DRG: 897 | Disposition: A | Discharge status: Home / Self Care | Payer: Medicare Other | Source: Ambulatory Visit | Attending: Psychiatry | Admitting: Psychiatry

## 2011-09-07 ENCOUNTER — Emergency Department (HOSPITAL_COMMUNITY): Payer: Medicare Other

## 2011-09-07 ENCOUNTER — Encounter (HOSPITAL_COMMUNITY): Payer: Self-pay | Admitting: *Deleted

## 2011-09-07 DIAGNOSIS — F609 Personality disorder, unspecified: Secondary | ICD-10-CM

## 2011-09-07 DIAGNOSIS — F1020 Alcohol dependence, uncomplicated: Principal | ICD-10-CM

## 2011-09-07 DIAGNOSIS — IMO0002 Reserved for concepts with insufficient information to code with codable children: Secondary | ICD-10-CM

## 2011-09-07 DIAGNOSIS — F1410 Cocaine abuse, uncomplicated: Secondary | ICD-10-CM

## 2011-09-07 DIAGNOSIS — F1920 Other psychoactive substance dependence, uncomplicated: Secondary | ICD-10-CM

## 2011-09-07 DIAGNOSIS — I10 Essential (primary) hypertension: Secondary | ICD-10-CM

## 2011-09-07 DIAGNOSIS — F4310 Post-traumatic stress disorder, unspecified: Secondary | ICD-10-CM

## 2011-09-07 LAB — COMPREHENSIVE METABOLIC PANEL
ALT: 14 U/L (ref 0–53)
AST: 26 U/L (ref 0–37)
Albumin: 4 g/dL (ref 3.5–5.2)
Alkaline Phosphatase: 142 U/L — ABNORMAL HIGH (ref 39–117)
BUN: 7 mg/dL (ref 6–23)
Chloride: 101 mEq/L (ref 96–112)
Potassium: 3.7 mEq/L (ref 3.5–5.1)
Sodium: 138 mEq/L (ref 135–145)
Total Bilirubin: 0.5 mg/dL (ref 0.3–1.2)
Total Protein: 7.8 g/dL (ref 6.0–8.3)

## 2011-09-07 LAB — RAPID URINE DRUG SCREEN, HOSP PERFORMED
Amphetamines: NOT DETECTED
Barbiturates: NOT DETECTED
Benzodiazepines: POSITIVE — AB
Tetrahydrocannabinol: NOT DETECTED

## 2011-09-07 LAB — POCT I-STAT TROPONIN I: Troponin i, poc: 0.01 ng/mL (ref 0.00–0.08)

## 2011-09-07 MED ORDER — MAGNESIUM HYDROXIDE 400 MG/5ML PO SUSP: 30.0000 mL | Freq: Every day | ORAL | Status: DC | PRN

## 2011-09-07 MED ORDER — IBUPROFEN 600 MG PO TABS: 600.0000 mg | ORAL_TABLET | Freq: Three times a day (TID) | ORAL | Status: DC | PRN

## 2011-09-07 MED ORDER — LORAZEPAM 1 MG PO TABS
1.0000 mg | ORAL_TABLET | Freq: Three times a day (TID) | ORAL | Status: DC | PRN
  Administered 2011-09-07: 1 mg via ORAL
  Filled 2011-09-07: qty 1

## 2011-09-07 MED ORDER — NICOTINE 21 MG/24HR TD PT24: 21.0000 mg | MEDICATED_PATCH | Freq: Every day | TRANSDERMAL | Status: DC

## 2011-09-07 MED ORDER — ONDANSETRON HCL 4 MG PO TABS: 4.0000 mg | ORAL_TABLET | Freq: Three times a day (TID) | ORAL | Status: DC | PRN

## 2011-09-07 MED ORDER — ALUM & MAG HYDROXIDE-SIMETH 200-200-20 MG/5ML PO SUSP: 30.0000 mL | ORAL | Status: DC | PRN

## 2011-09-07 MED ORDER — ACETAMINOPHEN 325 MG PO TABS
650.0000 mg | ORAL_TABLET | ORAL | Status: DC | PRN
  Administered 2011-09-09: 650 mg via ORAL

## 2011-09-07 MED ORDER — CARBAMAZEPINE 200 MG PO TABS
200.0000 mg | ORAL_TABLET | Freq: Three times a day (TID) | ORAL | Status: DC
  Administered 2011-09-07 – 2011-09-08 (×3): 200 mg via ORAL
  Filled 2011-09-07 (×4): qty 1

## 2011-09-07 MED ORDER — ACETAMINOPHEN 325 MG PO TABS: 650.0000 mg | ORAL_TABLET | ORAL | Status: DC | PRN

## 2011-09-07 MED ORDER — HYDROXYZINE HCL 25 MG PO TABS: 50.0000 mg | ORAL_TABLET | Freq: Every evening | ORAL | Status: DC | PRN

## 2011-09-07 MED ORDER — NICOTINE 21 MG/24HR TD PT24
21.0000 mg | MEDICATED_PATCH | Freq: Every day | TRANSDERMAL | Status: DC
  Filled 2011-09-07 (×5): qty 1

## 2011-09-07 NOTE — ED Notes (Signed)
Patient discharged via security to [facility] 

## 2011-09-07 NOTE — Tx Team (Signed)
Initial Interdisciplinary Treatment Plan  PATIENT STRENGTHS: (choose at least two) Average or above average intelligence Communication skills Motivation for treatment/growth Supportive family/friends  PATIENT STRESSORS: Marital or family conflict Medication change or noncompliance Substance abuse   PROBLEM LIST: Problem List/Patient Goals Date to be addressed Date deferred Reason deferred Estimated date of resolution  Polysubstance abuse-cocaine and etoh  09-07-11           Suicide ideation                                            DISCHARGE CRITERIA:  Improved stabilization in mood, thinking, and/or behavior Reduction of life-threatening or endangering symptoms to within safe limits Verbal commitment to aftercare and medication compliance Withdrawal symptoms are absent or subacute and managed without 24-hour nursing intervention  PRELIMINARY DISCHARGE PLAN: Attend aftercare/continuing care group Attend 12-step recovery group Return to previous living arrangement  PATIENT/FAMIILY INVOLVEMENT: This treatment plan has been presented to and reviewed with the patient, [name], and/or family member.  The patient and family have been given the opportunity to ask questions and make suggestions.  [name] 09/07/2011, 6:53 PM

## 2011-09-07 NOTE — Progress Notes (Signed)
Writer observed patient lying in bed asleep but was easily aroused when his name was called. Pt received his scheduled 200 mg of tegretol but refused his nicotine patch. Writer asked if patient felt ok or if he was just tired and pt replied he was tired. Writer asked if he was hungry and pt reported that he had already eaten. Writer informed pt that if he had trouble sleeping later on in the evening he had visteril available. Writer allowed pt to continue to rest. Safety maintained on unit will continue to monitor.
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
## 2011-09-07 NOTE — Progress Notes (Signed)
Pt accepted to [facility] by Dr. Dan Humphreys to Dr. Koren Shiver [id number] and will be transported to [facility] via security.  Pt will arrive at [facility] by 1800 and be processed through admission.  Pt forms signed and sent to [facility].

## 2011-09-07 NOTE — ED Provider Notes (Signed)
History     CSN: [id number]  Arrival date & time 09/06/11  2300   First MD Initiated Contact with Patient 09/07/11 0009      Chief Complaint  Patient presents with  . Medical Clearance    (Consider location/radiation/quality/duration/timing/severity/associated sxs/prior treatment) The history is provided by the patient.   patient here complaining of suicidal ideations. History of multiple suicide attempts in the past. Patient admits using cocaine prior to arrival denies alcohol use. States he has had increased stress as of late. Denies any homicidal ideations, visual or auditory hallucinations. The cocaine to make him go slightly better. He does have a recent admission to [facility] for his history of bipolar disorder  Past Medical History  Diagnosis Date  . Anxiety   . Bipolar 1 disorder   . Asthma   . Hypertension   . Depression     History reviewed. No pertinent past surgical history.  History reviewed. No pertinent family history.  History  Substance Use Topics  . Smoking status: Never Smoker   . Smokeless tobacco: Never Used  . Alcohol Use: 1.8 oz/week    1 Glasses of wine, 2 Cans of beer, 0 Shots of liquor, 0 Drinks containing 0.5 oz of alcohol per week      Review of Systems  All other systems reviewed and are negative.    Allergies  Review of patient's allergies indicates no known allergies.  Home Medications   Current Outpatient Rx  Name Route Sig Dispense Refill  . ARIPIPRAZOLE 5 MG PO TABS Oral Take 1 tablet (5 mg total) by mouth daily. For mood stabilization. 30 tablet 0  . CITALOPRAM HYDROBROMIDE 10 MG PO TABS Oral Take 1 tablet (10 mg total) by mouth 2 (two) times daily. 30 tablet 0  . LORAZEPAM 0.5 MG PO TABS Oral Take 2 mg by mouth every 8 (eight) hours.     Marland Kitchen NAPROXEN 125 MG/5ML PO SUSP Oral Take by mouth 2 (two) times daily.      Marland Kitchen PAROXETINE HCL 10 MG PO TABS Oral Take 10 mg by mouth every morning.        BP 127/86  Pulse  97  Temp(Src) 98.6 F (37 C) (Oral)  Resp 20  SpO2 98%  Physical Exam  Nursing note and vitals reviewed. Constitutional: He is oriented to person, place, and time. Vital signs are normal. He appears well-developed and well-nourished.  Non-toxic appearance. No distress.  HENT:  Head: Normocephalic and atraumatic.  Eyes: Conjunctivae, EOM and lids are normal. Pupils are equal, round, and reactive to light.  Neck: Normal range of motion. Neck supple. No tracheal deviation present. No mass present.  Cardiovascular: Normal rate, regular rhythm and normal heart sounds.  Exam reveals no gallop.   No murmur heard. Pulmonary/Chest: Effort normal and breath sounds normal. No stridor. No respiratory distress. He has no decreased breath sounds. He has no wheezes. He has no rhonchi. He has no rales.  Abdominal: Soft. Normal appearance and bowel sounds are normal. He exhibits no distension. There is no tenderness. There is no rebound and no CVA tenderness.  Musculoskeletal: Normal range of motion. He exhibits no edema and no tenderness.  Neurological: He is alert and oriented to person, place, and time. He has normal strength. No cranial nerve deficit or sensory deficit. GCS eye subscore is 4. GCS verbal subscore is 5. GCS motor subscore is 6.  Skin: Skin is warm and dry. No abrasion and no rash noted.  Psychiatric:  His speech is normal. His affect is blunt. He is withdrawn. He exhibits a depressed mood.    ED Course  Procedures (including critical care time)   Labs Reviewed  CBC  COMPREHENSIVE METABOLIC PANEL  ETHANOL  URINE RAPID DRUG SCREEN (HOSP PERFORMED)   No results found.   No diagnosis found.    MDM   Date: 09/07/2011  Rate: 81  Rhythm: normal sinus rhythm  QRS Axis: normal  Intervals: normal  ST/T Wave abnormalities: ST depressions laterally  Conduction Disutrbances:none  Narrative Interpretation:   Old EKG Reviewed: changes noted        Spoke with act team team,  will see and disposition  Toy Baker, MD 09/08/11 [phone]

## 2011-09-08 DIAGNOSIS — I10 Essential (primary) hypertension: Secondary | ICD-10-CM | POA: Diagnosis present

## 2011-09-08 DIAGNOSIS — F4310 Post-traumatic stress disorder, unspecified: Secondary | ICD-10-CM | POA: Diagnosis present

## 2011-09-08 DIAGNOSIS — F1920 Other psychoactive substance dependence, uncomplicated: Secondary | ICD-10-CM

## 2011-09-08 MED ORDER — NAPROXEN SODIUM 550 MG PO TABS: 550.0000 mg | ORAL_TABLET | Freq: Two times a day (BID) | ORAL | Status: DC

## 2011-09-08 MED ORDER — CITALOPRAM HYDROBROMIDE 20 MG PO TABS
20.0000 mg | ORAL_TABLET | Freq: Every day | ORAL | Status: DC
  Administered 2011-09-08 – 2011-09-11 (×4): 20 mg via ORAL
  Filled 2011-09-08 (×5): qty 1

## 2011-09-08 MED ORDER — AMLODIPINE BESYLATE 5 MG PO TABS
5.0000 mg | ORAL_TABLET | Freq: Every day | ORAL | Status: DC
  Administered 2011-09-08 – 2011-09-11 (×4): 5 mg via ORAL
  Filled 2011-09-08 (×5): qty 1

## 2011-09-08 MED ORDER — ARIPIPRAZOLE 5 MG PO TABS
5.0000 mg | ORAL_TABLET | Freq: Every day | ORAL | Status: DC
  Administered 2011-09-08 – 2011-09-11 (×4): 5 mg via ORAL
  Filled 2011-09-08 (×5): qty 1
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
## 2011-09-08 NOTE — Discharge Planning (Signed)
Returning patient who says he's here due to depression, relapse.  Lives at home with family.  Says father will not allow him to return.  Also says he has not seen the ACT team since they picked him up here when he was d/ced the last time.  Will call Clemetine Marker to let him know pt is here.  Pt and I will attempt to contact father later.

## 2011-09-08 NOTE — Progress Notes (Signed)
Patient ID: [name], male   DOB: 1971/03/23, 41 y.o.   MRN: [id number] Nursing     Pt. Rates his depression as 10/10 and his Hopelessness as 2/10.States this is the last time  He will relapse.States he has agitation and Fatigue.Encouraged and supported.Marland Kitchen
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
## 2011-09-09 NOTE — Progress Notes (Signed)
Pt stays isolative to his room in the evenings and is presently in bed with covers over his head.  He is easily awakened when spoken to.  He affirms that he is welcome to return home at discharge.  He reports he is feeling better, but does not want to go to group tonight even with encouragement from staff.  He denies withdrawal symptoms.  He denies SI/HI.  Safety maintained with q15 minute checks.

## 2011-09-09 NOTE — Progress Notes (Signed)
Pt. Pleasant and cooperative.  Reports that he has gone to the majority of his groups today.  States that he did not attend the group with the dog because he has asthma.  No issues or concerns voiced at present.Marland Kitchen

## 2011-09-09 NOTE — Progress Notes (Signed)
Pt states he slept poor, energy level is low, appetite low. Pt rates his depression as an 8 and hopelessness as a 8. Pt c/o of the following withdrawal symptoms: tremors, sedation, diarrhea, cravings. Pt has thoughts of SI/contracts for safety. Pt seems depressed, flat affect. Pt asked for/received tylenol this am. Pt has a pain goal of 5 today. 15 minutes for safety, safety maintained.

## 2011-09-09 NOTE — Discharge Planning (Signed)
Today in group Isaia says he is welcome to return home, which he didn't believe to be the case yesterday.  He is heartened by this news.  Also talked about his call for help to ACT team on Friday.  Says he told them he wanted to be discharged from their services because they were providing nothing for him.  This was Nain's indirect way of asking for help.  CM will follow up ACT team.

## 2011-09-09 NOTE — Treatment Plan (Signed)
Interdisciplinary Treatment Plan Update (Adult)  Date: 09/09/2011  Time Reviewed: 8:23 AM   Progress in Treatment: Attending groups: Yes Participating in groups: Yes Taking medication as prescribed: Yes Tolerating medication: Yes   Family/Significant othe contact made: Counselor will contact re: suicide prevention  Patient understands diagnosis:  Yes  As evidenced by asking for help with substance abuse, and with depression and SI Discussing patient identified problems/goals with staff:  Yes See below Medical problems stabilized or resolved:  Yes Denies suicidal/homicidal ideation: No per self inventory, constantly.  In tx team, states they are fleeting, and they are thoughts of cutting.  Agreed to ask for help when thoughts occur. Issues/concerns per patient self-inventory:  Yes Depression and hopelessness=8  Positive for all withdrawal symptoms Sleep, Appetite, Energy all poor Other:  New problem(s) identified: N/A  Reason for Continuation of Hospitalization: Depression Medication stabilization  Interventions implemented related to continuation of hospitalization: [facility] detox protocol  Encourage group attendance and participation  Additional comments:  Estimated length of stay: 3 days  Discharge Plan:Return home  Follow up ACT team  New goal(s): N/A  Review of initial/current patient goals per problem list:   1.  Goal(s):Safely detox from alcohol  Met:  No  Target date:1/10  As evidenced [id number] in CIWA score from current to 0  2.  Goal (s):Decrease depression  Met:  No  Target date:1/11  As evidenced WG:[id number] depression as 3 or less on self inventory,  Identifying one additional coping skill  3.  Goal(s):  Met:  No  Target date:  As evidenced by:  4.  Goal(s):  Met:  No  Target date:  As evidenced by:  Attendees: Patient:  [name] 09/09/2011 8:23 AM  Family:     Physician:  Lupe Carney 09/09/2011 8:23 AM   Nursing:  Carolynn Comment  09/09/2011 8:23 AM   Case Manager:  Richelle Ito, LCSW 09/09/2011 8:23 AM   Counselor:  [name], LCSWA 09/09/2011 8:23 AM   Other:     Other:     Other:     Other:      Scribe for Treatment Team:   Ida Rogue, 09/09/2011 8:23 AM

## 2011-09-10 NOTE — Discharge Planning (Signed)
Called Clemetine Marker at [facility] ACT team.  Asked for help with transpo tomorrow.  He will pick up [name] at 11:00.  Also asked him to help the team understand that when Rual calls to tell the team he wants to fire them, they need to ignore the content and pay attention to the process, which is Merrik asking for help.  He understands, and will work with the team on this.

## 2011-09-10 NOTE — Progress Notes (Signed)
Patient ID: [name], male   DOB: [birth year], 41 y.o.   MRN: [id number] Nursing      Pt.rates his Depression and Hopelessness as &/10. He denies S/I,H/I and A/V hallucinations. Encouraged and supported,

## 2011-09-10 NOTE — Progress Notes (Signed)
Patient ID: [name], male   DOB: [birth year], 41 y.o.   MRN: [id number]  Pt seen and evaluated in treatment team.  Reviewed short term and long term goals, medications, current treatment in the hospital and acute safety.  Pt denied any current thoughts of self harm, suicidal ideation or homicidal ideation.  Contracted for safety on the unit.  No acute issues noted.  VS reviewed with team.  Pt agreeable with treatment plan, see orders.  Continue current meds as noted below.      Meds:   . amLODipine  5 mg Oral Daily  . ARIPiprazole  5 mg Oral Daily  . citalopram  20 mg Oral Daily  . nicotine  21 mg Transdermal Daily

## 2011-09-10 NOTE — Progress Notes (Signed)
Pt is up and in the dayroom.  Praised pt for being up and not in bed.  Pt reports he is being discharged tomorrow or Friday to home.  He denies SI/HI at this time.  He voices no needs/concerns.  Safety maintained with q15 minute checks.

## 2011-09-10 NOTE — Progress Notes (Signed)
[facility] Group Notes:  (Counselor/Nursing/MHT/Case Management/Adjunct)  09/10/2011 5:50 PM   Type of Therapy:  Processing Group at 11:00 am  Participation Level:  Did Not Attend  [facility] Group Notes:  (Counselor/Nursing/MHT/Case Management/Adjunct)  09/10/2011 5:50 PM   Type of Therapy:  Counseling Group at 1:15 pm  Participation Level:  Minimal  Participation Quality:  Hesitant  Affect:  Depressed, withdrawn  Cognitive:  Oriented  Insight:  None  Engagement in Group:  Extremely limited  Engagement in Therapy:  None  Modes of Intervention:  [name] was present for group session but very withdrawn; looking down with hands on head and only shared once after being asked to contribute at several points. In response to the question what would be the worse thing about your future should you continue in the cycle of active addiction patient responded "other people would make fun of me"   Summary of Progress/Problems:  [name], LCSWA 09/10/2011 5:50 PM

## 2011-09-10 NOTE — Progress Notes (Signed)
[facility] Group Notes:  (Counselor/Nursing/MHT/Case Management/Adjunct)  09/09/2011 1:48 PM   Type of Therapy:  Processing Group at 11:00 am  Participation Level:  Did Not Attend Clide Dales 09/10/2011 5:47 PM

## 2011-09-10 NOTE — Progress Notes (Signed)
[facility] Family/Significant Other Suicide Prevention Education  Suicide Prevention Education:  Patient Refusal for Family/Significant Other Suicide Prevention Education: The patient [name] has refused to provide written consent for family/significant other to be provided Family/Significant Other Suicide Prevention Education during admission and/or prior to discharge.  Physician notified. Pt has provided consent for family to be contacted in regard to the possibility of his coming home yet he will not agree to suicide prevention consent. As  Pt expressed desire to discharge earlier today writer provided suicide prevention education directly to patient; conversation included risk factors, warning signs and resources to contact for help. Pt verified his understanding of mobile crisis services and reports to Clinical research associate he has their business card in his wallet.   Clide Dales 09/10/2011, 5:59 PM

## 2011-09-11 MED ORDER — AMLODIPINE BESYLATE 5 MG PO TABS: 5.0000 mg | ORAL_TABLET | Freq: Every day | ORAL | Status: DC

## 2011-09-11 MED ORDER — CITALOPRAM HYDROBROMIDE 20 MG PO TABS: 20.0000 mg | ORAL_TABLET | Freq: Every day | ORAL | Status: DC

## 2011-09-11 NOTE — Treatment Plan (Signed)
Interdisciplinary Treatment Plan Update (Adult)  Date: 09/11/2011  Time Reviewed: 10:03 AM   Progress in Treatment: Attending groups: Yes Participating in groups: Yes Taking medication as prescribed: Yes Tolerating medication: Yes   Family/Significant othe contact made:   Patient understands diagnosis:  Yes Discussing patient identified problems/goals with staff:  Yes Medical problems stabilized or resolved:  Yes Denies suicidal/homicidal ideation: Yes Issues/concerns per patient self-inventory:  No Other:  New problem(s) identified: N/A  Reason for Continuation of Hospitalization: Other; describe D/C today  Interventions implemented related to continuation of hospitalization:   Additional comments:  Estimated length of stay:D/C today  Discharge Plan:Return home, follow up ACT team  New goal(s): N/A  Review of initial/current patient goals per problem list:   1.  Goal(s):Safely detox from alcohol  Met:  Yes  Target date:  As evidenced by:   2.  Goal (s):Decrease depression  Met:  Yes  Target date:  As evidenced by:  3.  Goal(s):  Met:  Yes  Target date:  As evidenced by:  4.  Goal(s):  Met:  Yes  Target date:  As evidenced by:  Attendees: Patient:  [name] 09/11/2011 10:03 AM  Family:     Physician:  Lupe Carney 09/11/2011 10:03 AM   Nursing:Brook McNichol   09/11/2011 10:03 AM   Case Manager:  Richelle Ito, LCSW 09/11/2011 10:03 AM   Counselor:  Vanetta Mulders LPCA 09/11/2011 10:03 AM   Other:     Other:     Other:     Other:      Scribe for Treatment Team:   Daryel Gerald B, 09/11/2011 10:03 AM
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
## 2011-09-11 NOTE — Progress Notes (Signed)
Patient ID: [name], male   DOB: February 11, 1971, 41 y.o.   MRN: [id number] Patient has order for d/c and denies SI. Pt verbalized understanding of instructions and follow up. He was picked up by ACT team representative.

## 2011-09-12 NOTE — Progress Notes (Signed)
Patient Discharge Instructions:  After Visit Summary Faxed,  09/12/2011 Faxed to the [facility] Care provider:  09/12/2011 Facesheet faxed 09/12/2011  Faxed to [facility] @ [phone]  Wandra Scot, 09/12/2011, 4:14 PM

## 2011-09-16 NOTE — Discharge Summary (Signed)
  Patient ID: [name] MRN: [id number] DOB/AGE: 11/14/1970 41 y.o.  Admit date: 08/15/2011 Discharge date: 08/20/2011  Discharge Diagnoses: Alcohol dependence, Cocaine abuse, BPD,PTSD Principal Problem:  *Polysubstance dependence cocaine & alcohol   HPI: The patient presented to the ED having relapsed on Cocaine again and having drunk 1 beer.  He is extremely anxious as he is now homeless again after having to leave his parents house due to his relapse. Hospital Course;   The duration of [name]"s stay at [facility] was unremarkable.      The patient was seen and evaluated by the Treatment team consisting of Psychiatrist, PAC, RN, Case Manager, and Therapist for evaluation and treatment plan with goal of stabilization upon discharge. The patient's physical and mental health problems were identified and treated appropriately.      Multiple modalities of treatment were used including medication, individual and group therapies, unit programming, AA/NA, improved nutrition, physical activity, and family sessions as needed.     The symptoms of alcohol/substance abuse withdrawal were monitored daily by serial clinical withdrawal scores. Improvement was demonstrated by declining CIWA/COWS numbers, improving vital signs, increased cognition, and improvement in mood, sleep, appetite as well as a reduction in psychosocial symptoms.       The patient was evaluated and found to be stable enough for discharge and was released to  [facility] team per the initial plan of treatment.  MSE: For mental status exam see SRA completed by MD.  Labs: None pending upon discharge Discharge Medication List as of 08/21/2011  2:00 PM    START taking these medications   Details  ARIPiprazole (ABILIFY) 5 MG tablet Take 1 tablet (5 mg total) by mouth daily. For mood stabilization., Starting 08/20/2011, Until Fri 09/19/11, Print    citalopram (CELEXA) 10 MG tablet Take 1 tablet (10 mg total) by mouth 2 (two) times  daily., Starting 08/20/2011, Until Thu 08/19/12, Print      STOP taking these medications     carisoprodol (SOMA) 350 MG tablet      cyclobenzaprine (FLEXERIL) 10 MG tablet      LORazepam (ATIVAN) 2 MG tablet      PARoxetine (PAXIL) 40 MG tablet      risperiDONE (RISPERDAL) 2 MG tablet        follow-up Information    Follow up with [facility] team on 08/20/2011. Clemetine Marker will see you today)    Contact information:   [address]  [place], [place] [address] [phone]         Lloyd Huger T. [facility] 08/20/2011

## 2011-09-18 NOTE — Discharge Summary (Signed)
Patient ID: [name] MRN: [id number] DOB/AGE: 05/08/1971 41 y.o.  Admit date: 09/07/2011 Discharge date: 09/11/2011  AXIS I Post Traumatic Stress Disorder and Polysubstance dependence, HTN  AXIS II No diagnosis  AXIS III Past Medical History  Diagnosis Date  . Depressed      AXIS IV educational problems, housing problems, problems with access to health care services and problems with primary support group  AXIS V 40-60 moderate symptoms     HPI: [name] was admitted to [facility] from ED where he presented with alcohol intoxication and being put out of the house due to his relapse on cocaine, homelessness. The patient was admitted to [facility] for crisis management and stabilization.  He states he felt he left too early last time.  He was discharged at his and his family's request to be "home for the holidays."     Hospital Course;   The duration of [name]"s stay at [facility] was unremarkable.      The patient was seen and evaluated by the Treatment team consisting of Psychiatrist, PAC, RN, Case Manager, and Therapist for evaluation and treatment plan with goal of stabilization upon discharge. The patient's physical and mental health problems were identified and treated appropriately.      Multiple modalities of treatment were used including medication, individual and group therapies, unit programming, AA/NA, improved nutrition, physical activity, and family sessions as needed.     The symptoms of alcohol/substance abuse withdrawal were monitored daily by serial clinical withdrawal scores. Improvement was demonstrated by declining CIWA/COWS numbers, improving vital signs, increased cognition, and improvement in mood, sleep, appetite as well as a reduction in psychosocial symptoms.       The patient was evaluated and found to be stable enough for discharge and was released to his ACT team, [facility] per the initial plan of treatment.   Labs: None pending Discharge Medication List as of 09/12/2011  10:47 AM    START taking these medications   Details  amLODipine (NORVASC) 5 MG tablet Take 1 tablet (5 mg total) by mouth daily., Starting 09/11/2011, Until Fri 09/10/12, Print      CONTINUE these medications which have CHANGED   Details  citalopram (CELEXA) 20 MG tablet Take 1 tablet (20 mg total) by mouth daily. For anxiety and depression., Starting 09/11/2011, Until Fri 09/10/12, Normal      CONTINUE these medications which have NOT CHANGED   Details  ARIPiprazole (ABILIFY) 5 MG tablet Take 1 tablet (5 mg total) by mouth daily. For mood stabilization., Starting 08/20/2011, Until Fri 09/19/11, Print    naproxen (NAPROSYN) 125 MG/5ML suspension Take by mouth 2 (two) times daily.  , Until Discontinued, Historical Med       Discharge Orders    Future Orders Please Complete By Expires   Diet - low sodium heart healthy      Scheduling Instructions:   Also try to avoid Naprosyn or Aleve if possible.   Increase activity slowly      Discharge instructions      Comments:   Follow up with ACT team as discussed.  Be sure to see your family medicine physician as soon as possible.     Follow-up Information    Follow up with [facility] on 09/11/2011. Thayer Ohm will pick you up at [facility] to take you home)    Contact information:   [address]  [place]  [phone]     Please keep your scheduled appointment with  [facility] for evaluation and treatment of your Hypertension.    Signed: Belinda Schlichting 09/18/2011, 8:25 AM

## 2011-09-21 NOTE — H&P (Signed)
Psychiatric Admission Assessment Adult  Patient Identification:  [name] Date of Evaluation:      09/07/2011 Chief Complaint:   MOOD DISORDER COCAINE DEPENDENCE History of Present Illness: Pt. Presented to ED stating he had relapsed on alcohol and cocaine.  He was recently a patient at [facility] and was discharged home to his family at their request for the holiday.  He reports he had a good holiday and then relapsed.  As a result he can not return to his family's home. Mood Symptoms: Shame, depression, and anxiety  Depression Symptoms:   (Hypo) Manic Symptoms: None Anxiety Symptoms: worry Psychotic Symptoms: denies PTSD Symptoms: no Abuse/Neglect/Assault/trauma: none   Past Psychiatric History: Diagnosis:  Chronic substance abuse, alcohol abuse  Hospitalizations: many  Outpatient Care:  None currently  Substance Abuse Care: none  Self-Mutilation:  none  Suicidal Attempts:  none  Violent Behaviors:  none   Primary Care Provider:  Recent discharge from PCP due to substance abuse and failure to keep appointments, medical non compliance.  Past Medical History:   Past Medical History  Diagnosis Date  . Anxiety   . Bipolar 1 disorder   . Asthma   . Hypertension   . Depression    Traumatic Brain Injury: denied History of Loss of Consciousness: denied  Surgical History: Seizure History:  none Cardiac History:   none  Allergies:  No Known Allergies Current Medications:  No current facility-administered medications for this encounter.   Current Outpatient Prescriptions  Medication Sig Dispense Refill  . amLODipine (NORVASC) 5 MG tablet Take 1 tablet (5 mg total) by mouth daily.  30 tablet  0  . citalopram (CELEXA) 20 MG tablet Take 1 tablet (20 mg total) by mouth daily. For anxiety and depression.  30 tablet  0  . naproxen (NAPROSYN) 125 MG/5ML suspension Take by mouth 2 (two) times daily.          Previous Psychotropic Medications: Medication Dose                       Substance Abuse History Substance Age of 1st Use Last Use Amount Specific Type  Nicotine      Alcohol      Cannabis      Opiates      Cocaine      Methamphetamines      LSD      Ecstasy      Benzodiazepines      Caffeine      Inhalants      Others:                        Alcohol History: Age of 1st use: Previous rehab: Hx. Of DT: Hx. Of Seizures with withdrawal: Hx. Of black outs:  Legal Charges:  Time served: Court date: Dept. Social Services involvement:  DT's:  No Withdrawal Symptoms:  None  Social History: Current Place of Residence:  homeless Place of Birth:   Family Members: Marital Status:  single Children: Education:   Religious Beliefs/Practices: Employment:  unemployed Hotel manager History:   Family History:  History reviewed. No pertinent family history.  ROS: Negative PE: Completed with no acute findings. I have reviewed these findings and agree with them.  LABS: UDS+ cocaine, benzodiazepines             Etoh <11 Mental Status Examination/Evaluation Objective:  Appearance: Fairly Groomed  Eye Contact::  Fair  Speech:  Clear and Coherent  Volume:  Normal  Mood:    Affect:  Congruent  Thought Process:  Linear  Orientation:  Full  Thought Content:  No hallucinations  Suicidal Thoughts:  Yes.  without intent/plan  Homicidal Thoughts:  No  Judgement:  Impaired  Insight:  Lacking  Psychomotor Activity:  Decreased  Akathisia:  No  Handed:  Right  AIMS (if indicated):     Assets:  Desire for Improvement Leisure Time    AXIS I Cocaine abuse, alcohol abuse, alcohol dependency  AXIS II Deferred  AXIS III Past Medical History  Diagnosis Date  . Anxiety   . Bipolar 1 disorder   . Asthma   . Hypertension   . Depression      AXIS IV economic problems, housing problems, occupational problems, problems with access to health care services and problems with primary support group  AXIS V 51-60 moderate symptoms   Recommendations:  Recommend long term rehabilitation in residential facility Treatment Plan Summary: Daily contact with patient to assess and evaluate symptoms and progress in treatment Medication management  Observation Level/Precautions:  Laboratory:    Psychotherapy:    Medications:    Routine PRN Medications:  Yes  Consultations:    Discharge Concerns:    Other:     [name] [facility] 09/07/2011

## 2011-10-11 ENCOUNTER — Encounter (HOSPITAL_COMMUNITY): Payer: Self-pay | Admitting: Emergency Medicine

## 2011-10-11 ENCOUNTER — Emergency Department (HOSPITAL_COMMUNITY)
Admission: EM | Admit: 2011-10-11 | Discharge: 2011-10-11 | Disposition: A | Discharge status: Home / Self Care | Payer: Medicare Other | Attending: Emergency Medicine | Admitting: Emergency Medicine

## 2011-10-11 DIAGNOSIS — R300 Dysuria: Secondary | ICD-10-CM

## 2011-10-11 DIAGNOSIS — I10 Essential (primary) hypertension: Secondary | ICD-10-CM | POA: Insufficient documentation

## 2011-10-11 DIAGNOSIS — F319 Bipolar disorder, unspecified: Secondary | ICD-10-CM | POA: Insufficient documentation

## 2011-10-11 DIAGNOSIS — R000 Tachycardia, unspecified: Secondary | ICD-10-CM | POA: Insufficient documentation

## 2011-10-11 DIAGNOSIS — R109 Unspecified abdominal pain: Secondary | ICD-10-CM | POA: Insufficient documentation

## 2011-10-11 DIAGNOSIS — F341 Dysthymic disorder: Secondary | ICD-10-CM | POA: Insufficient documentation

## 2011-10-11 DIAGNOSIS — R3915 Urgency of urination: Secondary | ICD-10-CM | POA: Insufficient documentation

## 2011-10-11 LAB — URINALYSIS, ROUTINE W REFLEX MICROSCOPIC
Bilirubin Urine: NEGATIVE
Ketones, ur: NEGATIVE mg/dL
Nitrite: NEGATIVE
Urobilinogen, UA: 0.2 mg/dL (ref 0.0–1.0)
pH: 6 (ref 5.0–8.0)

## 2011-10-11 MED ORDER — TAMSULOSIN HCL 0.4 MG PO CAPS: 0.4000 mg | ORAL_CAPSULE | Freq: Every day | ORAL | Status: DC

## 2011-10-11 MED ORDER — CIPROFLOXACIN HCL 500 MG PO TABS: 500.0000 mg | ORAL_TABLET | Freq: Two times a day (BID) | ORAL | Status: AC

## 2011-10-11 NOTE — ED Notes (Signed)
Pt alert, nad, c/o lower abd pain, onset 5-6 months ago, pt states " i think i have an enlarged prostate" denies changes in bowel, states poor urine output, described as a "dribbling stream"

## 2011-10-11 NOTE — ED Notes (Signed)
Allen, MD at bedside

## 2011-10-11 NOTE — ED Provider Notes (Signed)
History     CSN: [id number]  Arrival date & time 10/11/11  0108   First MD Initiated Contact with Patient 10/11/11 0209      Chief Complaint  Patient presents with  . Abdominal Pain    (Consider location/radiation/quality/duration/timing/severity/associated sxs/prior treatment) Patient is a 41 y.o. male presenting with abdominal pain. The history is provided by the patient.  Abdominal Pain The primary symptoms of the illness include abdominal pain.   patient presents here with urinary urgency x5 months. States that over the last 3 weeks has gotten worse. Denies any dysuria or penile discharge. Some suprapubic pain, no fever or vomiting. Denies any flank pain. Has not been seen by a urologist in the past. No medications taken prior to arrival.  Past Medical History  Diagnosis Date  . Anxiety   . Bipolar 1 disorder   . Asthma   . Hypertension   . Depression     History reviewed. No pertinent past surgical history.  No family history on file.  History  Substance Use Topics  . Smoking status: Never Smoker   . Smokeless tobacco: Never Used  . Alcohol Use: 3.6 oz/week    1 Glasses of wine, 0 Shots of liquor, 0 Drinks containing 0.5 oz of alcohol, 5 Cans of beer per week     binger on etoh and cocaine      Review of Systems  Gastrointestinal: Positive for abdominal pain.  All other systems reviewed and are negative.    Allergies  Review of patient's allergies indicates no known allergies.  Home Medications   Current Outpatient Rx  Name Route Sig Dispense Refill  . AMLODIPINE BESYLATE 5 MG PO TABS Oral Take 1 tablet (5 mg total) by mouth daily. 30 tablet 0  . CARISOPRODOL 350 MG PO TABS Oral Take 350 mg by mouth 4 (four) times daily as needed.    Marland Kitchen CITALOPRAM HYDROBROMIDE 20 MG PO TABS Oral Take 1 tablet (20 mg total) by mouth daily. For anxiety and depression. 30 tablet 0    BP 142/87  Pulse 110  Temp 98 F (36.7 C)  Resp 16  Wt 260 lb (117.935 kg)  SpO2  99%  Physical Exam  Nursing note and vitals reviewed. Constitutional: He is oriented to person, place, and time. He appears well-developed and well-nourished.  Non-toxic appearance. No distress.  HENT:  Head: Normocephalic and atraumatic.  Eyes: Conjunctivae, EOM and lids are normal. Pupils are equal, round, and reactive to light.  Neck: Normal range of motion. Neck supple. No tracheal deviation present. No mass present.  Cardiovascular: Regular rhythm and normal heart sounds.  Tachycardia present.  Exam reveals no gallop.   No murmur heard. Pulmonary/Chest: Effort normal and breath sounds normal. No stridor. No respiratory distress. He has no decreased breath sounds. He has no wheezes. He has no rhonchi. He has no rales.  Abdominal: Soft. Normal appearance and bowel sounds are normal. He exhibits no distension. There is no tenderness. There is no rigidity, no rebound, no guarding and no CVA tenderness.  Musculoskeletal: Normal range of motion. He exhibits no edema and no tenderness.  Neurological: He is alert and oriented to person, place, and time. He has normal strength. No cranial nerve deficit or sensory deficit. GCS eye subscore is 4. GCS verbal subscore is 5. GCS motor subscore is 6.  Skin: Skin is warm and dry. No abrasion and no rash noted.  Psychiatric: His speech is normal and behavior is normal. His affect is blunt.  ED Course  Procedures (including critical care time)   Labs Reviewed  URINALYSIS, ROUTINE W REFLEX MICROSCOPIC  URINE CULTURE   No results found.   No diagnosis found.    MDM  Urinalysis is negative, patient to be discharged to home. Will place patient on Cipro for possible enlarged. Patient deferred his rectal exam        Toy Baker, MD 10/11/11 [phone]

## 2011-10-11 NOTE — ED Notes (Signed)
Pt arrives via EMS, c/o abd pain, onset was today, described as sharp stabbing, +nausea, no emesis, denies diarrhea, resp even unlabored, skin pwd, ambulates to triage

## 2011-10-11 NOTE — ED Notes (Signed)
[id number]<VW> Expected date:10/11/11<BR> Expected time:12:54 AM<BR> Means of arrival:Ambulance<BR> Comments:<BR> EMS 251 GC, 35 yom abdominal pain

## 2011-10-12 LAB — URINE CULTURE: Culture  Setup Time: 201302091127

## 2011-11-16 ENCOUNTER — Other Ambulatory Visit (HOSPITAL_COMMUNITY): Payer: Self-pay | Admitting: Physician Assistant

## 2013-03-27 ENCOUNTER — Emergency Department (HOSPITAL_COMMUNITY): Payer: Medicare Other

## 2013-03-27 ENCOUNTER — Encounter (HOSPITAL_COMMUNITY): Payer: Self-pay | Admitting: *Deleted

## 2013-03-27 ENCOUNTER — Emergency Department (HOSPITAL_COMMUNITY)
Admission: EM | Admit: 2013-03-27 | Discharge: 2013-03-27 | Disposition: A | Discharge status: Home / Self Care | Payer: Medicare Other | Attending: Emergency Medicine | Admitting: Emergency Medicine

## 2013-03-27 DIAGNOSIS — F411 Generalized anxiety disorder: Secondary | ICD-10-CM | POA: Insufficient documentation

## 2013-03-27 DIAGNOSIS — J45909 Unspecified asthma, uncomplicated: Secondary | ICD-10-CM | POA: Insufficient documentation

## 2013-03-27 DIAGNOSIS — R0789 Other chest pain: Secondary | ICD-10-CM | POA: Insufficient documentation

## 2013-03-27 DIAGNOSIS — F419 Anxiety disorder, unspecified: Secondary | ICD-10-CM | POA: Diagnosis present

## 2013-03-27 DIAGNOSIS — F319 Bipolar disorder, unspecified: Secondary | ICD-10-CM | POA: Insufficient documentation

## 2013-03-27 DIAGNOSIS — R002 Palpitations: Secondary | ICD-10-CM | POA: Insufficient documentation

## 2013-03-27 DIAGNOSIS — I10 Essential (primary) hypertension: Secondary | ICD-10-CM | POA: Insufficient documentation

## 2013-03-27 LAB — BASIC METABOLIC PANEL
CO2: 27 mEq/L (ref 19–32)
Chloride: 101 mEq/L (ref 96–112)
Creatinine, Ser: 0.89 mg/dL (ref 0.50–1.35)
Glucose, Bld: 101 mg/dL — ABNORMAL HIGH (ref 70–99)

## 2013-03-27 LAB — PRO B NATRIURETIC PEPTIDE: Pro B Natriuretic peptide (BNP): 21.4 pg/mL (ref 0–125)

## 2013-03-27 LAB — POCT I-STAT TROPONIN I: Troponin i, poc: 0 ng/mL (ref 0.00–0.08)

## 2013-03-27 MED ORDER — DIAZEPAM 2 MG PO TABS
2.0000 mg | ORAL_TABLET | Freq: Once | ORAL | Status: AC
  Administered 2013-03-27: 2 mg via ORAL
  Filled 2013-03-27: qty 1

## 2013-03-27 MED ORDER — DIAZEPAM 2 MG PO TABS: 2.0000 mg | ORAL_TABLET | Freq: Two times a day (BID) | ORAL | Status: DC

## 2013-03-27 MED ORDER — POTASSIUM CHLORIDE CRYS ER 20 MEQ PO TBCR
40.0000 meq | EXTENDED_RELEASE_TABLET | Freq: Once | ORAL | Status: AC
  Administered 2013-03-27: 40 meq via ORAL
  Filled 2013-03-27: qty 2

## 2013-03-27 MED ORDER — DIAZEPAM 5 MG PO TABS
5.0000 mg | ORAL_TABLET | Freq: Once | ORAL | Status: AC
  Administered 2013-03-27: 5 mg via ORAL
  Filled 2013-03-27: qty 1

## 2013-03-27 MED ORDER — POTASSIUM CHLORIDE 20 MEQ/15ML (10%) PO LIQD: 20.0000 meq | Freq: Once | ORAL | Status: DC

## 2013-03-27 NOTE — ED Notes (Addendum)
2110  Introduced self to the pt.  Pt relaxing at this time  2210  Pt off the floor to X-Ray.  Status of pt is stable at this time

## 2013-03-27 NOTE — ED Notes (Signed)
EMS-pt reports left sided chest pain x 2 days. 324 ASA given en route. 20g(L)AC.

## 2013-03-27 NOTE — ED Provider Notes (Addendum)
CSN: [id number]     Arrival date & time 03/27/13  2040 History     First MD Initiated Contact with Patient 03/27/13 2111     Chief Complaint  Patient presents with  . Chest Pain   (Consider location/radiation/quality/duration/timing/severity/associated sxs/prior Treatment) HPI [name] 42 y.o. with a history of anxiety, bipolar, asthma, hypertension and depression presents for palpitations they're associated with slight chest discomfort. These palpitations started more than 5 months ago but had been worsening in the past 2 weeks. Patient notes they were increasing today as he is under a tremendous amount of stress at home with his family. He describes it as "heart skipping a beat". He does note some chest discomfort with this. This difficult to describe. It only occurs with the palpitations, and resolves with cessation of the palpitations. Denies shortness of breath, dizziness, lightheadedness. Patient does drink caffeinated beverages regularly. He denies any drug use.  Past Medical History  Diagnosis Date  . Anxiety   . Bipolar 1 disorder   . Asthma   . Hypertension   . Depression    History reviewed. No pertinent past surgical history. History reviewed. No pertinent family history. History  Substance Use Topics  . Smoking status: Never Smoker   . Smokeless tobacco: Never Used  . Alcohol Use: 3.6 oz/week    1 Glasses of wine, 0 Shots of liquor, 0 Drinks containing 0.5 oz of alcohol, 5 Cans of beer per week     Comment: binger on etoh and cocaine    Review of Systems  Allergies  Review of patient's allergies indicates no known allergies.  Home Medications   Current Outpatient Rx  Name  Route  Sig  Dispense  Refill  . acetaminophen (TYLENOL) 500 MG tablet   Oral   Take 1,000 mg by mouth every 6 (six) hours as needed for pain.         Marland Kitchen ibuprofen (ADVIL,MOTRIN) 200 MG tablet   Oral   Take 200 mg by mouth every 6 (six) hours as needed for pain.         Marland Kitchen  risperiDONE (RISPERDAL) 1 MG tablet   Oral   Take 1 mg by mouth 2 (two) times daily.         . diazepam (VALIUM) 2 MG tablet   Oral   Take 1-2 tablets (2-4 mg total) by mouth 2 (two) times daily.   12 tablet   0    BP 151/104  Pulse 77  Temp(Src) 99 F (37.2 C) (Oral)  Resp 18  SpO2 97% Physical Exam  Nursing note and vitals reviewed. Constitutional: He is oriented to person, place, and time. He appears well-developed and well-nourished. No distress.  HENT:  Head: Normocephalic and atraumatic.  Eyes: Conjunctivae and EOM are normal. Right eye exhibits no discharge. Left eye exhibits no discharge.  Neck: Normal range of motion. Neck supple. No tracheal deviation present.  Cardiovascular: Normal rate, regular rhythm and normal heart sounds.  Exam reveals no friction rub.   No murmur heard. Pulmonary/Chest: Effort normal and breath sounds normal. No stridor. No respiratory distress. He has no wheezes. He has no rales. He exhibits no tenderness.  Abdominal: Soft. He exhibits no distension. There is no tenderness. There is no rebound and no guarding.  Neurological: He is alert and oriented to person, place, and time.  Skin: Skin is warm.  Psychiatric: He has a normal mood and affect. He expresses no homicidal and no suicidal ideation. He expresses no  suicidal plans and no homicidal plans.    ED Course   Procedures (including critical care time)  Labs Reviewed  BASIC METABOLIC PANEL - Abnormal; Notable for the following:    Potassium 3.2 (*)    Glucose, Bld 101 (*)    All other components within normal limits  PRO B NATRIURETIC PEPTIDE  POCT I-STAT TROPONIN I   Dg Chest 2 View  03/27/2013   *RADIOLOGY REPORT*  Clinical Data: Chest pain.  CHEST - 2 VIEW  Comparison: 09/07/2011  Findings: Lung volumes are low bilaterally.  No evidence of pulmonary edema or focal airspace consolidation.  Heart size is within normal limits.  There is stable tortuosity of the thoracic aorta.  The  bony thorax is unremarkable.  IMPRESSION: No active disease.   Original Report Authenticated By: Irish Lack, M.D.   Results for orders placed during the hospital encounter of 03/27/13  BASIC METABOLIC PANEL      Result Value Range   Sodium 135  135 - 145 mEq/L   Potassium 3.2 (*) 3.5 - 5.1 mEq/L   Chloride 101  96 - 112 mEq/L   CO2 27  19 - 32 mEq/L   Glucose, Bld 101 (*) 70 - 99 mg/dL   BUN 8  6 - 23 mg/dL   Creatinine, Ser 2.95  0.50 - 1.35 mg/dL   Calcium 8.9  8.4 - 62.1 mg/dL   GFR calc non Af Amer >90  >90 mL/min   GFR calc Af Amer >90  >90 mL/min  PRO B NATRIURETIC PEPTIDE      Result Value Range   Pro B Natriuretic peptide (BNP) 21.4  0 - 125 pg/mL  POCT I-STAT TROPONIN I      Result Value Range   Troponin i, poc 0.00  0.00 - 0.08 ng/mL   Comment 3              Date: 03/28/2013  Rate: 89  Rhythm: normal sinus rhythm, with singular PVCs  QRS Axis: indeterminate  Intervals: normal  ST/T Wave abnormalities: nonspecific ST/T changes  Conduction Disutrbances:none  Narrative Interpretation: Normal sinus rhythm with singular PVCs  Old EKG Reviewed: unchanged   1. Anxiety   2. Palpitations     MDM  Aggie Hacker 42 y.o. presents for palpitations, associated with discomfort. Symptoms not related to exertion. Symptoms have resolved. He reports being under a significant amount of stress at home and symptoms are related to stress. Afebrile vital signs stable. Hemodynamically stable. No respiratory distress. EKG shows singular PVCs with no change of underlying rhythm compared to previous. Chest x-ray negative for pneumothorax, pneumonia, or mediastinal widening. Troponin negative. BNP negative. Hemoglobin stable. Patient was given value with significant improvement in symptoms. Symptoms likely related to anxiety. Patient was discharged home with a minimal amount of anxiolytics and he was instructed to follow up with his PCP for further management of his anxiety. He was given  strong return precautions.  Labs, imaging, EKG reviewed. I discussed this patient's care with my attending, Dr. Oletta Lamas.  [name], MD 03/28/13 3086  [name], MD 05/12/13 [phone]

## 2013-03-28 NOTE — ED Provider Notes (Signed)
I saw and evaluated the patient, reviewed the resident's note and I agree with the findings and plan.  i reviewed and agree with ECG interpretation by Dr. Malen Gauze. pts pain improved in the ED. Unchanged ECG from priors, essentially.  Negative troponin.  pts anxiety treated in the ED.  Results reviewed with patient and encouraged follow up with PCP.  Heart rate normal Chest non tender Lungs clear, Abdomen soft Mood, mild anxiety   Impression Anxiety Non specific chest pain   [name]. Oletta Lamas, MD 03/28/13 [phone]

## 2013-04-05 ENCOUNTER — Encounter (HOSPITAL_COMMUNITY): Payer: Self-pay

## 2013-04-05 ENCOUNTER — Emergency Department (EMERGENCY_DEPARTMENT_HOSPITAL)
Admission: EM | Admit: 2013-04-05 | Discharge: 2013-04-06 | Disposition: A | Discharge status: Other Acute Inpatient Hospital | Payer: Medicare Other | Source: Home / Self Care | Attending: Emergency Medicine | Admitting: Emergency Medicine

## 2013-04-05 DIAGNOSIS — I10 Essential (primary) hypertension: Secondary | ICD-10-CM | POA: Insufficient documentation

## 2013-04-05 DIAGNOSIS — F4310 Post-traumatic stress disorder, unspecified: Secondary | ICD-10-CM

## 2013-04-05 DIAGNOSIS — F1010 Alcohol abuse, uncomplicated: Secondary | ICD-10-CM | POA: Insufficient documentation

## 2013-04-05 DIAGNOSIS — Z79899 Other long term (current) drug therapy: Secondary | ICD-10-CM | POA: Insufficient documentation

## 2013-04-05 DIAGNOSIS — J45909 Unspecified asthma, uncomplicated: Secondary | ICD-10-CM | POA: Insufficient documentation

## 2013-04-05 DIAGNOSIS — F39 Unspecified mood [affective] disorder: Secondary | ICD-10-CM | POA: Insufficient documentation

## 2013-04-05 DIAGNOSIS — F411 Generalized anxiety disorder: Secondary | ICD-10-CM | POA: Insufficient documentation

## 2013-04-05 DIAGNOSIS — R45851 Suicidal ideations: Secondary | ICD-10-CM | POA: Insufficient documentation

## 2013-04-05 DIAGNOSIS — F1410 Cocaine abuse, uncomplicated: Secondary | ICD-10-CM | POA: Insufficient documentation

## 2013-04-05 DIAGNOSIS — F1920 Other psychoactive substance dependence, uncomplicated: Secondary | ICD-10-CM

## 2013-04-05 DIAGNOSIS — F319 Bipolar disorder, unspecified: Secondary | ICD-10-CM | POA: Insufficient documentation

## 2013-04-05 DIAGNOSIS — F419 Anxiety disorder, unspecified: Secondary | ICD-10-CM

## 2013-04-05 DIAGNOSIS — F3131 Bipolar disorder, current episode depressed, mild: Secondary | ICD-10-CM

## 2013-04-05 HISTORY — DX: Post-traumatic stress disorder, unspecified: F43.10

## 2013-04-05 LAB — COMPREHENSIVE METABOLIC PANEL
AST: 21 U/L (ref 0–37)
Albumin: 3.8 g/dL (ref 3.5–5.2)
Alkaline Phosphatase: 122 U/L — ABNORMAL HIGH (ref 39–117)
BUN: 5 mg/dL — ABNORMAL LOW (ref 6–23)
CO2: 24 mEq/L (ref 19–32)
Chloride: 102 mEq/L (ref 96–112)
Potassium: 3.7 mEq/L (ref 3.5–5.1)
Total Bilirubin: 0.4 mg/dL (ref 0.3–1.2)

## 2013-04-05 LAB — RAPID URINE DRUG SCREEN, HOSP PERFORMED
Amphetamines: NOT DETECTED
Barbiturates: NOT DETECTED
Benzodiazepines: NOT DETECTED

## 2013-04-05 LAB — CBC
HCT: 42.5 % (ref 39.0–52.0)
Platelets: 298 10*3/uL (ref 150–400)
RBC: 5.37 MIL/uL (ref 4.22–5.81)
RDW: 14 % (ref 11.5–15.5)
WBC: 7.2 10*3/uL (ref 4.0–10.5)

## 2013-04-05 LAB — ACETAMINOPHEN LEVEL: Acetaminophen (Tylenol), Serum: <15 ug/mL (ref 10–30)

## 2013-04-05 MED ORDER — IBUPROFEN 200 MG PO TABS: 600.0000 mg | ORAL_TABLET | Freq: Three times a day (TID) | ORAL | Status: DC | PRN

## 2013-04-05 MED ORDER — RISPERIDONE 1 MG PO TABS
1.0000 mg | ORAL_TABLET | Freq: Two times a day (BID) | ORAL | Status: DC
  Administered 2013-04-05 – 2013-04-06 (×2): 1 mg via ORAL
  Filled 2013-04-05 (×2): qty 1

## 2013-04-05 MED ORDER — ACETAMINOPHEN 325 MG PO TABS: 650.0000 mg | ORAL_TABLET | ORAL | Status: DC | PRN

## 2013-04-05 MED ORDER — ONDANSETRON HCL 4 MG PO TABS: 4.0000 mg | ORAL_TABLET | Freq: Three times a day (TID) | ORAL | Status: DC | PRN

## 2013-04-05 MED ORDER — LORAZEPAM 1 MG PO TABS
1.0000 mg | ORAL_TABLET | Freq: Three times a day (TID) | ORAL | Status: DC | PRN
  Administered 2013-04-05: 1 mg via ORAL
  Filled 2013-04-05: qty 1

## 2013-04-05 NOTE — ED Notes (Signed)
Per EMS- Pt admits to SI without a plan denies HI problems with PTSD- admits to drugs and ETOH- kicked out of house yesterday here for evaluation

## 2013-04-05 NOTE — ED Provider Notes (Signed)
CSN: [id number]     Arrival date & time 04/05/13  1646 History     First MD Initiated Contact with Patient 04/05/13 1717     Chief Complaint  Patient presents with  . Medical Clearance   (Consider location/radiation/quality/duration/timing/severity/associated sxs/prior Treatment) HPI Comments: Patient with a history of Bipolar, Anxiety, and Depression presents today with a chief complaint of depression.  He reports that he has felt depressed for the past 3 weeks and recently began having suicidal thoughts.  He does not have a definitive plan at this time.  He reports that he was kicked out of his house yesterday.  He reports that he has had previous suicide attempts.  He denies homicidal thoughts.  He reports that he is currently on Risperdal, but does not take the medication as directed.  He does not feel that the medication helps.  He reports that he has recently been drinking alcohol and Cocaine over the past couple of days, but does not regularly drink alcohol or use Cocaine.  He denies any physical symptoms at this time.  The history is provided by the patient.    Past Medical History  Diagnosis Date  . Anxiety   . Bipolar 1 disorder   . Asthma   . Hypertension   . Depression   . PTSD (post-traumatic stress disorder)    History reviewed. No pertinent past surgical history. No family history on file. History  Substance Use Topics  . Smoking status: Never Smoker   . Smokeless tobacco: Never Used  . Alcohol Use: 3.6 oz/week    1 Glasses of wine, 5 Cans of beer, 0 Shots of liquor, 0 Drinks containing 0.5 oz of alcohol per week     Comment: binger on etoh and cocaine    Review of Systems  Psychiatric/Behavioral: Positive for suicidal ideas and dysphoric mood.  All other systems reviewed and are negative.    Allergies  Review of patient's allergies indicates no known allergies.  Home Medications   Current Outpatient Rx  Name  Route  Sig  Dispense  Refill  .  acetaminophen (TYLENOL) 500 MG tablet   Oral   Take 1,000 mg by mouth every 6 (six) hours as needed for pain.         . diazepam (VALIUM) 2 MG tablet   Oral   Take 1-2 tablets (2-4 mg total) by mouth 2 (two) times daily.   12 tablet   0   . ibuprofen (ADVIL,MOTRIN) 200 MG tablet   Oral   Take 200 mg by mouth every 6 (six) hours as needed for pain.         Marland Kitchen risperiDONE (RISPERDAL) 1 MG tablet   Oral   Take 1 mg by mouth 2 (two) times daily.          BP 157/90  Pulse 95  Temp(Src) 99.1 F (37.3 C) (Oral)  Resp 95  Ht 5\' 6"  (1.676 m)  Wt 255 lb (115.667 kg)  BMI 41.18 kg/m2  SpO2 96% Physical Exam  Nursing note and vitals reviewed. Constitutional: He appears well-developed and well-nourished.  HENT:  Head: Normocephalic and atraumatic.  Mouth/Throat: Oropharynx is clear and moist.  Neck: Normal range of motion. Neck supple.  Cardiovascular: Normal rate, regular rhythm and normal heart sounds.   Pulmonary/Chest: Effort normal and breath sounds normal.  Neurological: He is alert.  Skin: Skin is warm and dry.  Several old scars on the forearms bilaterally from previous self inflicted wounds.  Psychiatric: He  is not actively hallucinating. He exhibits a depressed mood. He expresses suicidal ideation. He expresses no homicidal ideation. He expresses no suicidal plans and no homicidal plans.    ED Course   Procedures (including critical care time)  Labs Reviewed  ACETAMINOPHEN LEVEL  CBC  COMPREHENSIVE METABOLIC PANEL  ETHANOL  SALICYLATE LEVEL  URINE RAPID DRUG SCREEN (HOSP PERFORMED)   No results found. No diagnosis found.  8:30 PM Discussed with Karleen Hampshire from Behavior Health.  He reports that he will do an assessment of the patient.  MDM  Patient presenting with suicidal thoughts, but no definitive plan.  Denies HI.  Labs unremarkable.  Psych holding orders placed.  Psych consult placed.  Pascal Lux [place], PA-C 04/06/13 [phone]

## 2013-04-06 ENCOUNTER — Inpatient Hospital Stay (HOSPITAL_COMMUNITY)
Admission: EM | Admit: 2013-04-06 | Discharge: 2013-04-12 | DRG: 897 | Disposition: A | Discharge status: Home / Self Care | Payer: Medicare Other | Source: Intra-hospital | Attending: Psychiatry | Admitting: Psychiatry

## 2013-04-06 ENCOUNTER — Encounter (HOSPITAL_COMMUNITY): Payer: Self-pay | Admitting: *Deleted

## 2013-04-06 DIAGNOSIS — J45909 Unspecified asthma, uncomplicated: Secondary | ICD-10-CM | POA: Diagnosis present

## 2013-04-06 DIAGNOSIS — F1920 Other psychoactive substance dependence, uncomplicated: Secondary | ICD-10-CM

## 2013-04-06 DIAGNOSIS — F4310 Post-traumatic stress disorder, unspecified: Secondary | ICD-10-CM | POA: Diagnosis present

## 2013-04-06 DIAGNOSIS — F329 Major depressive disorder, single episode, unspecified: Secondary | ICD-10-CM

## 2013-04-06 DIAGNOSIS — F411 Generalized anxiety disorder: Secondary | ICD-10-CM | POA: Diagnosis present

## 2013-04-06 DIAGNOSIS — F1010 Alcohol abuse, uncomplicated: Secondary | ICD-10-CM

## 2013-04-06 DIAGNOSIS — F419 Anxiety disorder, unspecified: Secondary | ICD-10-CM

## 2013-04-06 DIAGNOSIS — Z79899 Other long term (current) drug therapy: Secondary | ICD-10-CM

## 2013-04-06 DIAGNOSIS — F319 Bipolar disorder, unspecified: Secondary | ICD-10-CM | POA: Diagnosis present

## 2013-04-06 DIAGNOSIS — F1420 Cocaine dependence, uncomplicated: Principal | ICD-10-CM | POA: Diagnosis present

## 2013-04-06 DIAGNOSIS — I10 Essential (primary) hypertension: Secondary | ICD-10-CM | POA: Diagnosis present

## 2013-04-06 DIAGNOSIS — F3160 Bipolar disorder, current episode mixed, unspecified: Secondary | ICD-10-CM

## 2013-04-06 MED ORDER — CHLORDIAZEPOXIDE HCL 25 MG PO CAPS
25.0000 mg | ORAL_CAPSULE | ORAL | Status: AC
  Administered 2013-04-09 (×2): 25 mg via ORAL
  Filled 2013-04-06 (×2): qty 1

## 2013-04-06 MED ORDER — THIAMINE HCL 100 MG/ML IJ SOLN: 100.0000 mg | Freq: Once | INTRAMUSCULAR | Status: DC

## 2013-04-06 MED ORDER — CHLORDIAZEPOXIDE HCL 25 MG PO CAPS
25.0000 mg | ORAL_CAPSULE | Freq: Four times a day (QID) | ORAL | Status: AC
  Administered 2013-04-06 – 2013-04-07 (×5): 25 mg via ORAL
  Filled 2013-04-06 (×5): qty 1

## 2013-04-06 MED ORDER — ONDANSETRON 4 MG PO TBDP: 4.0000 mg | ORAL_TABLET | Freq: Four times a day (QID) | ORAL | Status: AC | PRN

## 2013-04-06 MED ORDER — HYDROXYZINE HCL 25 MG PO TABS: 25.0000 mg | ORAL_TABLET | Freq: Four times a day (QID) | ORAL | Status: AC | PRN

## 2013-04-06 MED ORDER — ACETAMINOPHEN 325 MG PO TABS
650.0000 mg | ORAL_TABLET | Freq: Four times a day (QID) | ORAL | Status: DC | PRN
Start: 2013-04-06 — End: 2013-04-13

## 2013-04-06 MED ORDER — CHLORDIAZEPOXIDE HCL 25 MG PO CAPS
25.0000 mg | ORAL_CAPSULE | Freq: Four times a day (QID) | ORAL | Status: AC | PRN
  Administered 2013-04-09: 25 mg via ORAL
  Filled 2013-04-06: qty 1

## 2013-04-06 MED ORDER — LOPERAMIDE HCL 2 MG PO CAPS: 2.0000 mg | ORAL_CAPSULE | ORAL | Status: AC | PRN

## 2013-04-06 MED ORDER — MAGNESIUM HYDROXIDE 400 MG/5ML PO SUSP: 30.0000 mL | Freq: Every day | ORAL | Status: DC | PRN

## 2013-04-06 MED ORDER — ALUM & MAG HYDROXIDE-SIMETH 200-200-20 MG/5ML PO SUSP: 30.0000 mL | ORAL | Status: DC | PRN

## 2013-04-06 MED ORDER — CHLORDIAZEPOXIDE HCL 25 MG PO CAPS
25.0000 mg | ORAL_CAPSULE | Freq: Three times a day (TID) | ORAL | Status: AC
  Administered 2013-04-08 (×3): 25 mg via ORAL
  Filled 2013-04-06 (×3): qty 1

## 2013-04-06 MED ORDER — VITAMIN B-1 100 MG PO TABS
100.0000 mg | ORAL_TABLET | Freq: Every day | ORAL | Status: DC
  Administered 2013-04-07 – 2013-04-12 (×6): 100 mg via ORAL
  Filled 2013-04-06 (×8): qty 1

## 2013-04-06 MED ORDER — TRAZODONE HCL 50 MG PO TABS
50.0000 mg | ORAL_TABLET | Freq: Every evening | ORAL | Status: DC | PRN
  Administered 2013-04-08: 50 mg via ORAL
  Filled 2013-04-06 (×8): qty 1

## 2013-04-06 MED ORDER — CHLORDIAZEPOXIDE HCL 25 MG PO CAPS
25.0000 mg | ORAL_CAPSULE | Freq: Every day | ORAL | Status: AC
  Administered 2013-04-10: 25 mg via ORAL
  Filled 2013-04-06: qty 1

## 2013-04-06 MED ORDER — ADULT MULTIVITAMIN W/MINERALS CH
1.0000 | ORAL_TABLET | Freq: Every day | ORAL | Status: DC
  Administered 2013-04-06 – 2013-04-12 (×7): 1 via ORAL
  Filled 2013-04-06 (×9): qty 1

## 2013-04-06 NOTE — Progress Notes (Signed)
[facility] ED Cm attempted to verify pcp but noted pt snoring and not responding to his name being called x 2

## 2013-04-06 NOTE — Progress Notes (Addendum)
Pt is currently having detox symptoms of tremors and agitation. Pt is endorsing anxiety of a level 5 out of 10. He is currently denying any SI/HI. Pt did not attend group this evening. Pt denies having any concerns he wished for this writer to address at this time.  A: Pt refused his medication this evening. Pt reports not needing a sleep-aid and that Trazodone causes nightmares for him. He had 25 mg of Librium at 1830 and therefore declined any additional librium at 2200. Continued support and availability as needed was extended to this pt. Staff continue to monitor pt with q91min checks.  R: Pt receptive to treatment. Pt remains safe at this time.

## 2013-04-06 NOTE — Tx Team (Addendum)
Initial Interdisciplinary Treatment Plan  PATIENT STRENGTHS: (choose at least two) Ability for insight Average or above average intelligence Capable of independent living Communication skills General fund of knowledge Motivation for treatment/growth  PATIENT STRESSORS: Marital or family conflict Occupational concerns Substance abuse   PROBLEM LIST: Problem List/Patient Goals Date to be addressed Date deferred Reason deferred Estimated date of resolution  SI r/t support system conflict 04/06/13   D/c        ETOH/Cocaine relapse 04/06/13   D/c              Housing issues/current hopelessness 04/06/13   D/c                     DISCHARGE CRITERIA:  Ability to meet basic life and health needs Adequate post-discharge living arrangements Improved stabilization in mood, thinking, and/or behavior Motivation to continue treatment in a less acute level of care Reduction of life-threatening or endangering symptoms to within safe limits Verbal commitment to aftercare and medication compliance Withdrawal symptoms are absent or subacute and managed without 24-hour nursing intervention  PRELIMINARY DISCHARGE PLAN: Attend 12-step recovery group Placement in alternative living arrangements  PATIENT/FAMIILY INVOLVEMENT: This treatment plan has been presented to and reviewed with the patient, KANIN LIA, and/or family member,  The patient and family have been given the opportunity to ask questions and make suggestions.  Cresenciano Lick 04/06/2013, 4:09 PM

## 2013-04-06 NOTE — Consult Note (Signed)
[facility] Psychiatry Consult   Reason for Consult:  Eval for IP psychiatric mgmt Referring Physician:  [facility] EDP  [name] is an 42 y.o. male.  Assessment: AXIS I:  Alcohol Abuse, Bipolar, mixed and Post Traumatic Stress Disorder AXIS II:  No diagnosis AXIS III:   Past Medical History  Diagnosis Date  . Anxiety   . Bipolar 1 disorder   . Asthma   . Hypertension   . Depression   . PTSD (post-traumatic stress disorder)    AXIS IV:  housing problems AXIS V:  21-30 behavior considerably influenced by delusions or hallucinations OR serious impairment in judgment, communication OR inability to function in almost all areas  Plan:  Recommend psychiatric Inpatient admission when medically cleared.  Subjective:   [name] is a 42 y.o. male patient presenting voluntarily to the [facility] ED with concerns with acute exacerbation of depressive sx along with recent alcohol and cocaine use. Patient has a hx of Bipolar 1 mixed and states over the past 3 weeks he's been sx of SI with plan. Patient also endorses racing thoughts, sadness, hopelessness, helplessness, guilt and feelings of worthlessness. Patient endorses his depression worsening since he was recently kicked out of his home patient denies any HI or AVH. Patient also denies any delusional thoughts and or paranoia. The patient states that he cannot contract for safety at this time.  HPI:  HPI Elements:     Past Psychiatric History: Past Medical History  Diagnosis Date  . Anxiety   . Bipolar 1 disorder   . Asthma   . Hypertension   . Depression   . PTSD (post-traumatic stress disorder)     reports that he has never smoked. He has never used smokeless tobacco. He reports that he drinks about 3.6 ounces of alcohol per week. He reports that he uses illicit drugs (Marijuana and Cocaine) about once per week. No family history on file.         Allergies:  No Known Allergies  Past Psychiatric History: Diagnosis:  Bipolar 1, PTSD,  Anxiety d/o  Hospitalizations:  [facility]  Outpatient Care:  unknown  Substance Abuse Care:  none  Self-Mutilation: cutting on UE  Suicidal Attempts: past  Violent Behaviors:  unknown   Objective: Blood pressure 137/71, pulse 93, temperature 98.5 F (36.9 C), temperature source Oral, resp. rate 18, height 5\' 6"  (1.676 m), weight 115.667 kg (255 lb), SpO2 99.00%.Body mass index is 41.18 kg/(m^2). Results for orders placed during the hospital encounter of 04/05/13 (from the past 72 hour(s))  URINE RAPID DRUG SCREEN (HOSP PERFORMED)     Status: Abnormal   Collection Time    04/05/13  5:44 PM      Result Value Range   Opiates NONE DETECTED  NONE DETECTED   Cocaine POSITIVE (*) NONE DETECTED   Benzodiazepines NONE DETECTED  NONE DETECTED   Amphetamines NONE DETECTED  NONE DETECTED   Tetrahydrocannabinol NONE DETECTED  NONE DETECTED   Barbiturates NONE DETECTED  NONE DETECTED   Comment:            DRUG SCREEN FOR MEDICAL PURPOSES     ONLY.  IF CONFIRMATION IS NEEDED     FOR ANY PURPOSE, NOTIFY LAB     WITHIN 5 DAYS.                LOWEST DETECTABLE LIMITS     FOR URINE DRUG SCREEN     Drug Class       Cutoff (ng/mL)  Amphetamine      1000     Barbiturate      200     Benzodiazepine   200     Tricyclics       300     Opiates          300     Cocaine          300     THC              50  ACETAMINOPHEN LEVEL     Status: None   Collection Time    04/05/13  5:45 PM      Result Value Range   Acetaminophen (Tylenol), Serum <15.0  10 - 30 ug/mL   Comment:            THERAPEUTIC CONCENTRATIONS VARY     SIGNIFICANTLY. A RANGE OF 10-30     ug/mL MAY BE AN EFFECTIVE     CONCENTRATION FOR MANY PATIENTS.     HOWEVER, SOME ARE BEST TREATED     AT CONCENTRATIONS OUTSIDE THIS     RANGE.     ACETAMINOPHEN CONCENTRATIONS     >150 ug/mL AT 4 HOURS AFTER     INGESTION AND >50 ug/mL AT 12     HOURS AFTER INGESTION ARE     OFTEN ASSOCIATED WITH TOXIC     REACTIONS.  CBC     Status: None    Collection Time    04/05/13  5:45 PM      Result Value Range   WBC 7.2  4.0 - 10.5 K/uL   RBC 5.37  4.22 - 5.81 MIL/uL   Hemoglobin 14.4  13.0 - 17.0 g/dL   HCT 14.7  82.9 - 56.2 %   MCV 79.1  78.0 - 100.0 fL   MCH 26.8  26.0 - 34.0 pg   MCHC 33.9  30.0 - 36.0 g/dL   RDW 13.0  86.5 - 78.4 %   Platelets 298  150 - 400 K/uL  COMPREHENSIVE METABOLIC PANEL     Status: Abnormal   Collection Time    04/05/13  5:45 PM      Result Value Range   Sodium 138  135 - 145 mEq/L   Potassium 3.7  3.5 - 5.1 mEq/L   Chloride 102  96 - 112 mEq/L   CO2 24  19 - 32 mEq/L   Glucose, Bld 92  70 - 99 mg/dL   BUN 5 (*) 6 - 23 mg/dL   Creatinine, Ser 6.96  0.50 - 1.35 mg/dL   Calcium 9.2  8.4 - 29.5 mg/dL   Total Protein 7.7  6.0 - 8.3 g/dL   Albumin 3.8  3.5 - 5.2 g/dL   AST 21  0 - 37 U/L   ALT 14  0 - 53 U/L   Alkaline Phosphatase 122 (*) 39 - 117 U/L   Total Bilirubin 0.4  0.3 - 1.2 mg/dL   GFR calc non Af Amer >90  >90 mL/min   GFR calc Af Amer >90  >90 mL/min   Comment:            The eGFR has been calculated     using the CKD EPI equation.     This calculation has not been     validated in all clinical     situations.     eGFR's persistently     <90 mL/min signify     possible Chronic Kidney Disease.  ETHANOL     Status: Abnormal   Collection Time    04/05/13  5:45 PM      Result Value Range   Alcohol, Ethyl (B) 43 (*) 0 - 11 mg/dL   Comment:            LOWEST DETECTABLE LIMIT FOR     SERUM ALCOHOL IS 11 mg/dL     FOR MEDICAL PURPOSES ONLY  SALICYLATE LEVEL     Status: Abnormal   Collection Time    04/05/13  5:45 PM      Result Value Range   Salicylate Lvl <2.0 (*) 2.8 - 20.0 mg/dL   Labs are reviewed and are pertinent for ( no critical lab values)  Current Facility-Administered Medications  Medication Dose Route Frequency Provider Last Rate Last Dose  . acetaminophen (TYLENOL) tablet 650 mg  650 mg Oral Q4H PRN Pascal Lux Wingen, PA-C      . ibuprofen (ADVIL,MOTRIN) tablet  600 mg  600 mg Oral Q8H PRN Pascal Lux Wingen, PA-C      . LORazepam (ATIVAN) tablet 1 mg  1 mg Oral Q8H PRN Pascal Lux Wingen, PA-C   1 mg at 04/05/13 2344  . ondansetron (ZOFRAN) tablet 4 mg  4 mg Oral Q8H PRN Pascal Lux Wingen, PA-C      . risperiDONE (RISPERDAL) tablet 1 mg  1 mg Oral BID Pascal Lux Wingen, PA-C   1 mg at 04/05/13 2313   Current Outpatient Prescriptions  Medication Sig Dispense Refill  . acetaminophen (TYLENOL) 500 MG tablet Take 1,000 mg by mouth every 6 (six) hours as needed for pain.      . diazepam (VALIUM) 2 MG tablet Take 1-2 tablets (2-4 mg total) by mouth 2 (two) times daily.  12 tablet  0  . ibuprofen (ADVIL,MOTRIN) 200 MG tablet Take 200 mg by mouth every 6 (six) hours as needed for pain.      Marland Kitchen risperiDONE (RISPERDAL) 1 MG tablet Take 1 mg by mouth 2 (two) times daily.        Psychiatric Specialty Exam:     Blood pressure 137/71, pulse 93, temperature 98.5 F (36.9 C), temperature source Oral, resp. rate 18, height 5\' 6"  (1.676 m), weight 115.667 kg (255 lb), SpO2 99.00%.Body mass index is 41.18 kg/(m^2).  General Appearance: Casual  Eye Contact::  Minimal  Speech:  Slow  Volume:  Decreased  Mood:  Depressed  Affect:  Congruent  Thought Process:  Circumstantial  Orientation:  Full (Time, Place, and Person)  Thought Content:  WDL  Suicidal Thoughts:  Yes.  without intent/plan  Homicidal Thoughts:  No  Memory:  Recent;   Fair  Judgement:  Poor  Insight:  Lacking  Psychomotor Activity:  Negative  Concentration:  Fair  Recall:  Fair  Akathisia:  Negative  Handed:  Right  AIMS (if indicated):     Assets:  Desire for Improvement  Sleep:      Treatment Plan Summary: 1) Admit to [address] [facility] for crises mgmt, safety and stabilization of MDD with poly substance ause. 2) Mgmt of applicable co-morbid conditions 3) Social work to aid in OP support services and psychiatric care to decrease chance for relapse 4) Administration of psychotropics and  psychotherapeutic interventions  Roopa Graver E 04/06/2013 12:37 AM

## 2013-04-06 NOTE — Progress Notes (Signed)
Nursing admit note-  Patient is a 42y/o AAM admitted voluntarily following medical clearance from [facility].  [name] presented with chief c/o SI and relapse of etoh/crack cocaine following 11/2 years of sobriety following conflicts in his residence.  He now states that he may need to look for alternative living arrangements.  He presents pleasant and cooperative but flat and depressed.  He is guarded with details of living situation and unsure of future plans.  He is on disability and denies legal charges. Medical hx of asthma and HTN with medication non-compliance for an undisclosed amount of time.  Belonging check completed and support offered.  POC initiated with 15' checks.  Safety maintained.
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
## 2013-04-07 ENCOUNTER — Encounter (HOSPITAL_COMMUNITY): Payer: Self-pay | Admitting: Psychiatry

## 2013-04-07 DIAGNOSIS — F329 Major depressive disorder, single episode, unspecified: Secondary | ICD-10-CM | POA: Diagnosis present

## 2013-04-07 DIAGNOSIS — F3289 Other specified depressive episodes: Secondary | ICD-10-CM

## 2013-04-07 DIAGNOSIS — F1420 Cocaine dependence, uncomplicated: Principal | ICD-10-CM | POA: Diagnosis present

## 2013-04-07 DIAGNOSIS — F4310 Post-traumatic stress disorder, unspecified: Secondary | ICD-10-CM

## 2013-04-07 DIAGNOSIS — F1010 Alcohol abuse, uncomplicated: Secondary | ICD-10-CM | POA: Diagnosis present

## 2013-04-07 MED ORDER — LISINOPRIL 20 MG PO TABS
20.0000 mg | ORAL_TABLET | Freq: Every day | ORAL | Status: DC
  Administered 2013-04-07 – 2013-04-12 (×6): 20 mg via ORAL
  Filled 2013-04-07: qty 4
  Filled 2013-04-07 (×8): qty 1

## 2013-04-07 MED ORDER — LISINOPRIL 10 MG PO TABS: 10.0000 mg | ORAL_TABLET | Freq: Every day | ORAL | Status: DC

## 2013-04-07 MED ORDER — LISINOPRIL 20 MG PO TABS: 20.0000 mg | ORAL_TABLET | Freq: Every day | ORAL | Status: DC

## 2013-04-07 MED ORDER — RISPERIDONE 1 MG PO TABS
1.0000 mg | ORAL_TABLET | Freq: Two times a day (BID) | ORAL | Status: DC
  Administered 2013-04-07 – 2013-04-10 (×7): 1 mg via ORAL
  Filled 2013-04-07 (×10): qty 1

## 2013-04-07 NOTE — Progress Notes (Signed)
Recreation Therapy Notes  Date: 08.07.2014 Time: 3:00pm Location: [address] Dayroom  Group Topic: Communication, Team Building, Problem Solving  Goal Area(s) Addresses:  Patient will work effectively in teams to accomplish shared goal. Patient will identify skills used to make activity successful. Patient will relate skills used in group session to positive lifestyle changes.  Behavioral Response: Did not attend.   Marykay Lex Eliyas Suddreth, LRT/CTRS  Tashya Alberty L 04/07/2013 9:00 PM

## 2013-04-07 NOTE — BHH Group Notes (Signed)
[facility] LCSW Group Therapy  04/07/2013 2:16 PM  Type of Therapy:  Group Therapy  Participation Level:  Did Not Attend  Smart, Abdulrahman Bracey 04/07/2013, 2:16 PM

## 2013-04-07 NOTE — Progress Notes (Signed)
D  Pt is in bed resting with eyes closed   No distress noted  Was reported that pt denies SI/HI    Pt has been in bed isolating most of the day  A   Will continue to monitor Q 15 min checks R  Pt safe at present

## 2013-04-07 NOTE — Progress Notes (Signed)
Patient did not attend the evening karaoke group. Pt remained in bed during group.  
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
## 2013-04-07 NOTE — Progress Notes (Signed)
D:  Patient up and present in the milieu off and on throughout the day.  Has not felt up to attending groups today.  He is on the Librium protocol and has been taking medications as prescribed.  States the Librium makes him tired.  His CIWA scores have been less than 5 this shift.   A:  Medications administered as prescribed.  Encouraged patient to be up and attending groups.   R:  Pleasant and cooperative with staff.  Interacting some with peers.  Tolerating medications well.  Safety on the unit is maintained with 15 minute checks.
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
## 2013-04-08 DIAGNOSIS — F1020 Alcohol dependence, uncomplicated: Secondary | ICD-10-CM

## 2013-04-08 NOTE — [facility] Group Notes (Signed)
[facility] LCSW Group Therapy  04/08/2013 1:59 PM  Type of Therapy:  Group Therapy  Participation Level:  Did Not Attend  Smart, Sheridan Hew 04/08/2013, 1:59 PM

## 2013-04-08 NOTE — Progress Notes (Signed)
Adult Psychoeducational Group Note  Date:  04/08/2013 Time:  8:30 PM  Group Topic/Focus:  Goals Group:   The focus of this group is to help patients establish daily goals to achieve during treatment and discuss how the patient can incorporate goal setting into their daily lives to aide in recovery.  Participation Level:  Active  Participation Quality:  Appropriate  Affect:  Appropriate  Cognitive:  Appropriate  Insight: Appropriate  Engagement in Group:  Engaged  Modes of Intervention:  Discussion  Additional Comments:  Pt attended AA  Aldona Lento 04/08/2013, 8:30 PM
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
## 2013-04-08 NOTE — Progress Notes (Signed)
D) Pt not attending groups. Rates his anxiety at a 7, depression at a 10 and his hopelessness at an 8. Denies SI and HI.  States he is not feeling at all like hurting himself. Rather he feels "too depressed". Sleeping a lot. A) Given support and encouragement. Verbal agreement made with Pt should his feelings change. R) Not attending the groups, sleeping a lot.

## 2013-04-08 NOTE — Clinical Social Work Note (Signed)
Voicemail left for [name] at [facility] [name] off today) in order to touch base with ACT team concerning aftercare for [name]. [name] is hoping to get into an [employer] (list provided by CSW) and continue to follow up with ACTT. He also has [facility] date for [facility] Aug 25th. His father is his payee and [name] is hoping to get this transferred to case worker at [facility], being that his father is not in his life and is not allowing him to come home when he discharges.

## 2013-04-08 NOTE — ED Provider Notes (Signed)
Medical screening examination/treatment/procedure(s) were performed by non-physician practitioner and as supervising physician I was immediately available for consultation/collaboration.   Quill Grinder H Breyden Jeudy, MD 04/08/13 1502 

## 2013-04-08 NOTE — Progress Notes (Signed)
Adult Psychoeducational Group Note  Date:  04/08/2013 Time:  10:00 AM  Group Topic/Focus:  Relapse Prevention Planning:   The focus of this group is to define relapse and discuss the need for planning to combat relapse.  Participation Level:  Did Not Attend  Participation Quality:    Affect:    Cognitive:    Insight:   Engagement in Group:    Modes of Intervention:    Additional Comments:  Pt has been in the bed all day long, refused to attend any groups.  Isla Pence M 04/08/2013, 3:56 PM

## 2013-04-08 NOTE — Progress Notes (Signed)
Adult Psychoeducational Group Note  Date:  04/08/2013 Time: 11:00AM  Group Topic/Focus:  Relapse Prevention Planning:   The focus of this group is to define relapse and discuss the need for planning to combat relapse.  Participation Level:  Did Not Attend  Participation Quality:    Affect:    Cognitive:    Insight:   Engagement in Group:    Modes of Intervention:    Additional Comments:  Pt has been in the bed, refused to attend group.  Isla Pence M 04/08/2013, 4:09 PM

## 2013-04-08 NOTE — Progress Notes (Signed)
[facility] MD Progress Note  04/08/2013 1:29 PM [name]  MRN:  [id number]  Subjective: [name] continue to endorse having been dealing with bad anxiety today. He says he is still craving cocaine, and battling severe depression. He adds that this is the worst of all the times that he has felt depressed. He states his biggest concerns after discharge is a place to live. He was living with his parents prior to coming to this hospital. Does not want to go back to the same situation he was living in prior to his admission. He does not think his parents care about him. He says that they are blaming him for his mental illness. [place] continue to endorse passive SI without plans and or intent to hurt himself.  Diagnosis:   Axis I: Alcohol Abuse, Post Traumatic Stress Disorder and Polysubstance dependence cocaine & alcohol Axis II: Deferred Axis III:  Past Medical History  Diagnosis Date  . Anxiety   . Bipolar 1 disorder   . Asthma   . Hypertension   . Depression   . PTSD (post-traumatic stress disorder)    Axis IV: other psychosocial or environmental problems and Chronic mental illness Axis V: 41-50 serious symptoms  ADL's:  Fairly intact  Sleep: Fair  Appetite:  Fair  Suicidal Ideation:  Plan:  Denies Intent:  Denies Means:  Denies Homicidal Ideation:  Plan:  Denies Intent:  Denies Means:  Denies  AEB (as evidenced by): Per patient's reports.  Psychiatric Specialty Exam: Review of Systems  Constitutional: Negative.   HENT: Negative.   Eyes: Negative.   Respiratory: Negative.   Cardiovascular: Negative.   Gastrointestinal: Negative.   Genitourinary: Negative.   Musculoskeletal: Negative.   Skin: Negative for itching and rash.       Several old healed scars to arm and forehead areas from self inflicted wounds.   Neurological: Negative.   Endo/Heme/Allergies: Negative.   Psychiatric/Behavioral: Positive for depression (Rated at #9), suicidal ideas (Yes, off and on, denies  plans and or intent) and substance abuse (Alcoholism, Cocaine abuse). Negative for hallucinations and memory loss. The patient is nervous/anxious (Rated at #8) and has insomnia (Currently being stabilized with medication).     Blood pressure 128/89, pulse 90, temperature 97.8 F (36.6 C), temperature source Oral, resp. rate 16, height 5\' 6"  (1.676 m), weight 116.121 kg (256 lb), SpO2 96.00%.Body mass index is 41.34 kg/(m^2).  General Appearance: Casual and Fairly Groomed  Patent attorney::  Fair  Speech:  Clear and Coherent  Volume:  Normal  Mood:  Anxious, Depressed and Worthless  Affect:  Flat, and congruent with mood.  Thought Process:  Coherent and Goal Directed  Orientation:  Full (Time, Place, and Person)  Thought Content:  Rumination  Suicidal Thoughts:  Off and on, denies any intent and or plans.  Homicidal Thoughts:  No  Memory:  Immediate;   Good Recent;   Good Remote;   Good  Judgement:  Fair  Insight:  Present  Psychomotor Activity:  Decreased  Concentration:  Fair  Recall:  Good  Akathisia:  No  Handed:  Right  AIMS (if indicated):     Assets:  Desire for Improvement  Sleep:  Number of Hours: 6.25   Current Medications: Current Facility-Administered Medications  Medication Dose Route Frequency Provider Last Rate Last Dose  . acetaminophen (TYLENOL) tablet 650 mg  650 mg Oral Q6H PRN Kerry Hough, PA-C      . alum & mag hydroxide-simeth (MAALOX/MYLANTA) 200-200-20 MG/5ML suspension 30  mL  30 mL Oral Q4H PRN Kerry Hough, PA-C      . chlordiazePOXIDE (LIBRIUM) capsule 25 mg  25 mg Oral Q6H PRN Kerry Hough, PA-C      . chlordiazePOXIDE (LIBRIUM) capsule 25 mg  25 mg Oral TID Kerry Hough, PA-C   25 mg at 04/08/13 1202   Followed by  . [START ON 04/09/2013] chlordiazePOXIDE (LIBRIUM) capsule 25 mg  25 mg Oral BH-qamhs Spencer E Simon, PA-C       Followed by  . [START ON 04/10/2013] chlordiazePOXIDE (LIBRIUM) capsule 25 mg  25 mg Oral Daily Kerry Hough, PA-C       . hydrOXYzine (ATARAX/VISTARIL) tablet 25 mg  25 mg Oral Q6H PRN Kerry Hough, PA-C      . lisinopril (PRINIVIL,ZESTRIL) tablet 20 mg  20 mg Oral Daily Sanjuana Kava, NP   20 mg at 04/08/13 0801  . loperamide (IMODIUM) capsule 2-4 mg  2-4 mg Oral PRN Kerry Hough, PA-C      . magnesium hydroxide (MILK OF MAGNESIA) suspension 30 mL  30 mL Oral Daily PRN Kerry Hough, PA-C      . multivitamin with minerals tablet 1 tablet  1 tablet Oral Daily Kerry Hough, PA-C   1 tablet at 04/08/13 0801  . ondansetron (ZOFRAN-ODT) disintegrating tablet 4 mg  4 mg Oral Q6H PRN Kerry Hough, PA-C      . risperiDONE (RISPERDAL) tablet 1 mg  1 mg Oral BID Sanjuana Kava, NP   1 mg at 04/08/13 0801  . thiamine (B-1) injection 100 mg  100 mg Intramuscular Once [employer], PA-C      . thiamine (VITAMIN B-1) tablet 100 mg  100 mg Oral Daily Kerry Hough, PA-C   100 mg at 04/08/13 0801  . traZODone (DESYREL) tablet 50 mg  50 mg Oral QHS,MR X 1 Kerry Hough, PA-C        Lab Results: No results found for this or any previous visit (from the past 48 hour(s)).  Physical Findings: AIMS: Facial and Oral Movements Muscles of Facial Expression: None, normal Lips and Perioral Area: None, normal Jaw: None, normal Tongue: None, normal,Extremity Movements Upper (arms, wrists, hands, fingers): None, normal Lower (legs, knees, ankles, toes): None, normal, Trunk Movements Neck, shoulders, hips: None, normal, Overall Severity Severity of abnormal movements (highest score from questions above): None, normal Incapacitation due to abnormal movements: None, normal Patient's awareness of abnormal movements (rate only patient's report): No Awareness, Dental Status Current problems with teeth and/or dentures?: No Does patient usually wear dentures?: No  CIWA:  CIWA-Ar Total: 3 COWS:     Treatment Plan Summary: Daily contact with patient to assess and evaluate symptoms and progress in  treatment Medication management  Plan: Supportive approach/coping skills/relapse prevention. Encouraged out of room, participation in group sessions and application of coping skills when distressed. Will continue to monitor response to/adverse effects of medications in use to assure effectiveness. Continue to monitor mood, behavior and interaction with staff and other patients. Discharge plans in progress. Continue current plan of care.  Medical Decision Making Problem Points:  Established problem, stable/improving (1), Review of last therapy session (1) and Review of psycho-social stressors (1) Data Points:  Review of medication regiment & side effects (2) Review of new medications or change in dosage (2)  I certify that inpatient services furnished can reasonably be expected to improve the patient's condition.   Sanjuana Kava,  PMHNP-BC 04/08/2013, 1:29 PM

## 2013-04-08 NOTE — [facility] Group Notes (Signed)
[facility] LCSW Aftercare Discharge Planning Group Note   04/08/2013 9:14 AM  Participation Quality:  Minimal   Mood/Affect:  Depressed  Depression Rating:  9  Anxiety Rating:  7  Thoughts of Suicide:  Yes Will you contract for safety?   Yes  Current AVH:  No  Plan for Discharge/Comments:  Pt reports that he decided to come to [facility] after drinking heavily and marijuana use with thoughts of SI. Pt is followed by [facility]. He reports feeling hopeless and is interested in longer term treatment-possibly [facility].   Transportation Means: mother  Supports: mother/some family   [place], [place]
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
## 2013-04-09 NOTE — [facility] Group Notes (Signed)
[facility] Group Notes: (Clinical Social Work)   04/09/2013      Type of Therapy:  Group Therapy   Participation Level:  Did Not Attend    Birgit Nowling Grossman-Orr, LCSW 04/09/2013, 11:16 AM     

## 2013-04-09 NOTE — Progress Notes (Addendum)
Patient ID: [name], male   DOB: 09/24/1970, 42 y.o.   MRN: [id number]  D: Pt has been very flat and depressed on the unit today. Pt has also been very guarded and has been very isolative. Pt has not attended any groups and has not engaged in any treatment. Pt did report some anxiety, and was given Librium. Pt reported been negative SI/HI, no HA/VH noted. A: 15 min checks continued for patient safety. R: Pts safety maintained.

## 2013-04-09 NOTE — Progress Notes (Signed)
Psychoeducational Group Note  Date:  04/09/2013 Time:  0945 am  Group Topic/Focus:  Identifying Needs:   The focus of this group is to help patients identify their personal needs that have been historically problematic and identify healthy behaviors to address their needs.  Participation Level:  Minimal  Participation Quality:  Appropriate  Affect:  Appropriate  Cognitive:  Appropriate  Insight:  Improving  Engagement in Group:  Improving  Additional Comments:    Andrena Mews 04/09/2013,2:56 PM

## 2013-04-09 NOTE — Progress Notes (Signed)
[facility] MD Progress Note  04/09/2013 5:52 PM MAJD TISSUE  MRN:  [id number] Subjective:  Still endorses feeling anxious, depressed. He admits he knows he is isolating, avoiding. He is trying to move on, but feels stuck on what happened in the past. He has a hard time letting go. When he starts thinking about it or he is involved in conflictive interactions with his father ,he feels triggered to use.  Diagnosis:  Cocaine Dependence, Alcohol Abuse, Depressive Disorder NOS  ADL's:  Intact  Sleep: Fair  Appetite:  Fair  Suicidal Ideation:  Plan:  denies Intent:  denies Means:  denies Homicidal Ideation:  Plan:  denies Intent:  denies Means:  denies AEB (as evidenced by):  Psychiatric Specialty Exam: Review of Systems  Constitutional: Positive for malaise/fatigue.  HENT: Negative.   Respiratory: Negative.   Cardiovascular: Negative.   Gastrointestinal: Negative.   Genitourinary: Negative.   Musculoskeletal: Negative.   Skin: Negative.   Neurological: Negative.   Endo/Heme/Allergies: Negative.   Psychiatric/Behavioral: Positive for depression and substance abuse. The patient is nervous/anxious.     Blood pressure 127/76, pulse 97, temperature 98.1 F (36.7 C), temperature source Oral, resp. rate 18, height 5\' 6"  (1.676 m), weight 116.121 kg (256 lb), SpO2 96.00%.Body mass index is 41.34 kg/(m^2).  General Appearance: Fairly Groomed  Patent attorney::  Poor  Speech:  Clear and Coherent, Slow and not spontaneous  Volume:  Decreased  Mood:  Anxious and Depressed  Affect:  Flat  Thought Process:  Coherent and Goal Directed  Orientation:  Full (Time, Place, and Person)  Thought Content:  Rumination and events from his past  Suicidal Thoughts:  Intermittent  Homicidal Thoughts:  No  Memory:  Immediate;   Fair Recent;   Fair Remote;   Fair  Judgement:  Fair  Insight:  Present  Psychomotor Activity:  Restlessness  Concentration:  Fair  Recall:  Fair  Akathisia:  No  Handed:   Right  AIMS (if indicated):     Assets:  Desire for Improvement  Sleep:  Number of Hours: 6.25   Current Medications: Current Facility-Administered Medications  Medication Dose Route Frequency Provider Last Rate Last Dose  . acetaminophen (TYLENOL) tablet 650 mg  650 mg Oral Q6H PRN Kerry Hough, PA-C      . alum & mag hydroxide-simeth (MAALOX/MYLANTA) 200-200-20 MG/5ML suspension 30 mL  30 mL Oral Q4H PRN Kerry Hough, PA-C      . chlordiazePOXIDE (LIBRIUM) capsule 25 mg  25 mg Oral BH-qamhs Spencer E Simon, PA-C   25 mg at 04/09/13 0759   Followed by  . [START ON 04/10/2013] chlordiazePOXIDE (LIBRIUM) capsule 25 mg  25 mg Oral Daily Kerry Hough, PA-C      . lisinopril (PRINIVIL,ZESTRIL) tablet 20 mg  20 mg Oral Daily Sanjuana Kava, NP   20 mg at 04/09/13 0759  . magnesium hydroxide (MILK OF MAGNESIA) suspension 30 mL  30 mL Oral Daily PRN Kerry Hough, PA-C      . multivitamin with minerals tablet 1 tablet  1 tablet Oral Daily Kerry Hough, PA-C   1 tablet at 04/09/13 0759  . risperiDONE (RISPERDAL) tablet 1 mg  1 mg Oral BID Sanjuana Kava, NP   1 mg at 04/09/13 1718  . thiamine (B-1) injection 100 mg  100 mg Intramuscular Once [employer], PA-C      . thiamine (VITAMIN B-1) tablet 100 mg  100 mg Oral Daily Kerry Hough, PA-C  100 mg at 04/09/13 4782    Lab Results: No results found for this or any previous visit (from the past 48 hour(s)).  Physical Findings: AIMS: Facial and Oral Movements Muscles of Facial Expression: None, normal Lips and Perioral Area: None, normal Jaw: None, normal Tongue: None, normal,Extremity Movements Upper (arms, wrists, hands, fingers): None, normal Lower (legs, knees, ankles, toes): None, normal, Trunk Movements Neck, shoulders, hips: None, normal, Overall Severity Severity of abnormal movements (highest score from questions above): None, normal Incapacitation due to abnormal movements: None, normal Patient's awareness of  abnormal movements (rate only patient's report): No Awareness, Dental Status Current problems with teeth and/or dentures?: No Does patient usually wear dentures?: No  CIWA:  CIWA-Ar Total: 0 COWS:     Treatment Plan Summary: Daily contact with patient to assess and evaluate symptoms and progress in treatment Medication management  Plan: Supportive approach/coping skills/relapse prevention           Continue to reassess the co morbidities            Continue detox  Medical Decision Making Problem Points:  Review of psycho-social stressors (1) Data Points:  Review of medication regiment & side effects (2)  I certify that inpatient services furnished can reasonably be expected to improve the patient's condition.   Jariah Tarkowski A 04/09/2013, 5:52 PM

## 2013-04-09 NOTE — [facility] Group Notes (Signed)
[facility] Group Notes:  (Nursing/MHT/Case Management/Adjunct)  Date:  04/09/2013  Time:  1:22 PM  Type of Therapy:  Psychoeducational Skills  Participation Level:  Did Not Attend  [name] 04/09/2013, 1:22 PM

## 2013-04-09 NOTE — Progress Notes (Signed)
Patient ID: [name], male   DOB: 1971-04-16, 42 y.o.   MRN: [id number]  D: Patient lying in bed with eyes closed. Respirations even and non-labored. A: Staff will monitor on q 15 minute checks, follow treatment plan, and give meds as ordered. R: Appears to be sleeping

## 2013-04-10 MED ORDER — TRAZODONE HCL 100 MG PO TABS
100.0000 mg | ORAL_TABLET | Freq: Every evening | ORAL | Status: DC | PRN
  Administered 2013-04-10 – 2013-04-11 (×2): 100 mg via ORAL
  Filled 2013-04-10 (×2): qty 1
  Filled 2013-04-10: qty 4

## 2013-04-10 MED ORDER — DIPHENHYDRAMINE HCL 25 MG PO CAPS
50.0000 mg | ORAL_CAPSULE | Freq: Every evening | ORAL | Status: DC | PRN
  Administered 2013-04-10: 50 mg via ORAL

## 2013-04-10 MED ORDER — ASENAPINE MALEATE 5 MG SL SUBL
5.0000 mg | SUBLINGUAL_TABLET | Freq: Two times a day (BID) | SUBLINGUAL | Status: DC
  Administered 2013-04-10 – 2013-04-12 (×4): 5 mg via SUBLINGUAL
  Filled 2013-04-10 (×3): qty 1
  Filled 2013-04-10: qty 8
  Filled 2013-04-10 (×5): qty 1
  Filled 2013-04-10: qty 8

## 2013-04-10 NOTE — Progress Notes (Signed)
Patient ID: [name], male   DOB: 06/17/1971, 42 y.o.   MRN: [id number]  Patient asleep; no s/s of distress noted at this time. Respirations regular and unlabored.

## 2013-04-10 NOTE — Progress Notes (Signed)
Patient did attend the evening speaker AA meeting.  

## 2013-04-10 NOTE — Progress Notes (Signed)
[facility] Group Notes:  (Nursing/MHT/Case Management/Adjunct)  Date:  04/09/2013  Time:  2100 Type of Therapy:  wrap up group  Participation Level:  Active  Participation Quality:  Appropriate, Attentive, Sharing and Supportive  Affect:  Appropriate  Cognitive:  Appropriate  Insight:  Improving  Engagement in Group:  Engaged  Modes of Intervention:  Clarification, Education and Support  Summary of Progress/Problems:    [name] 04/10/2013, 12:42 AM

## 2013-04-10 NOTE — [facility] Group Notes (Signed)
[facility] Group Notes:  (Clinical Social Work)  04/10/2013  10:00-11:00AM  Summary of Progress/Problems:   The main focus of today's process group was to   identify the patient's current support system and decide on other supports that can be put in place.  Four definitions/levels of support were discussed and an exercise was utilized to show how much stronger we become with additional supports.  An emphasis was placed on using counselor, doctor, therapy groups, 12-step groups, and problem-specific support groups to expand supports, as well as doing something different than has been done before. The patient expressed a willingness to get in touch with family members he had originally stated he would not contact.  Type of Therapy:  Process Group with Motivational Interviewing  Participation Level:  Minimal  Participation Quality:  Drowsy  Affect:  Depressed and Flat  Cognitive:  Oriented  Insight:  Improving  Engagement in Therapy:  Developing/Improving  Modes of Intervention:   Education, Support and Processing, Activity  Ambrose Mantle, LCSW 04/10/2013, 12:13 PM

## 2013-04-10 NOTE — Progress Notes (Signed)
[facility] MD Progress Note  04/10/2013 3:52 PM [name]  MRN:  [id number] Subjective:  Still not feeling well. He states that he cant quit thinking about what happened. The Risperdal he claims does not help. None of the medications in the past have helped. (Seroquel, Abilify, Risperdal, Neurontin, Celexa, Lexapro, Paxil among others) Sates he would like to have something will help with the panic attacks when they happen. States that the benzodiazepines help him but he knows we are not going to give them to him. He would like to try Saphris . He was  Made aware that it is in the same group of medications that can increase his blood sugar (seems Seroquel did) Diagnosis:  PTSD, Alcohol, Cocaine Dependence  ADL's:  Intact  Sleep: Poor  Appetite:  Fair  Suicidal Ideation:  Plan:  denies Intent:  denies Means:  denies Homicidal Ideation:  Plan:  denies Intent:  denies Means:  denies AEB (as evidenced by):  Psychiatric Specialty Exam: Review of Systems  Constitutional: Negative.   HENT: Negative.   Eyes: Negative.   Respiratory: Negative.   Cardiovascular: Negative.   Gastrointestinal: Negative.   Genitourinary: Negative.   Musculoskeletal: Negative.   Skin: Negative.   Neurological: Negative.   Endo/Heme/Allergies: Negative.   Psychiatric/Behavioral: Positive for depression and substance abuse. The patient is nervous/anxious and has insomnia.     Blood pressure 123/70, pulse 79, temperature 98.2 F (36.8 C), temperature source Oral, resp. rate 16, height 5\' 6"  (1.676 m), weight 116.121 kg (256 lb), SpO2 96.00%.Body mass index is 41.34 kg/(m^2).  General Appearance: Fairly Groomed  Patent attorney::  Minimal  Speech:  Clear and Coherent, Slow and not spontaneous  Volume:  Decreased  Mood:  Anxious and Depressed  Affect:  Flat  Thought Process:  Coherent and Goal Directed  Orientation:  Full (Time, Place, and Person)  Thought Content:  worries, concerns  Suicidal Thoughts:  No   Homicidal Thoughts:  No  Memory:  Immediate;   Fair Recent;   Fair Remote;   Fair  Judgement:  Fair  Insight:  Shallow  Psychomotor Activity:  Psychomotor Retardation  Concentration:  Fair  Recall:  Poor  Akathisia:  No  Handed:  Right  AIMS (if indicated):     Assets:  Desire for Improvement  Sleep:  Number of Hours: 6   Current Medications: Current Facility-Administered Medications  Medication Dose Route Frequency Provider Last Rate Last Dose  . acetaminophen (TYLENOL) tablet 650 mg  650 mg Oral Q6H PRN Kerry Hough, PA-C      . alum & mag hydroxide-simeth (MAALOX/MYLANTA) 200-200-20 MG/5ML suspension 30 mL  30 mL Oral Q4H PRN Kerry Hough, PA-C      . asenapine (SAPHRIS) sublingual tablet 5 mg  5 mg Sublingual BID Rachael Fee, MD   5 mg at 04/10/13 1142  . lisinopril (PRINIVIL,ZESTRIL) tablet 20 mg  20 mg Oral Daily Sanjuana Kava, NP   20 mg at 04/10/13 0814  . magnesium hydroxide (MILK OF MAGNESIA) suspension 30 mL  30 mL Oral Daily PRN Kerry Hough, PA-C      . multivitamin with minerals tablet 1 tablet  1 tablet Oral Daily Kerry Hough, PA-C   1 tablet at 04/10/13 0813  . thiamine (B-1) injection 100 mg  100 mg Intramuscular Once [employer], PA-C      . thiamine (VITAMIN B-1) tablet 100 mg  100 mg Oral Daily Kerry Hough, PA-C   100  mg at 04/10/13 0813  . traZODone (DESYREL) tablet 100 mg  100 mg Oral QHS PRN Rachael Fee, MD        Lab Results: No results found for this or any previous visit (from the past 48 hour(s)).  Physical Findings: AIMS: Facial and Oral Movements Muscles of Facial Expression: None, normal Lips and Perioral Area: None, normal Jaw: None, normal Tongue: None, normal,Extremity Movements Upper (arms, wrists, hands, fingers): None, normal Lower (legs, knees, ankles, toes): None, normal, Trunk Movements Neck, shoulders, hips: None, normal, Overall Severity Severity of abnormal movements (highest score from questions above):  None, normal Incapacitation due to abnormal movements: None, normal Patient's awareness of abnormal movements (rate only patient's report): No Awareness, Dental Status Current problems with teeth and/or dentures?: No Does patient usually wear dentures?: No  CIWA:  CIWA-Ar Total: 2 COWS:     Treatment Plan Summary: Daily contact with patient to assess and evaluate symptoms and progress in treatment Medication management  Plan: Supportive approach/coping skills/relapse prevention           Reassess co morbidities           Trial with Saphris 5 mg BID                             Trazodone 100 mg HS PRN sleep Medical Decision Making Problem Points:  Review of psycho-social stressors (1) Data Points:  Review of new medications or change in dosage (2)  I certify that inpatient services furnished can reasonably be expected to improve the patient's condition.   Dartha Rozzell A 04/10/2013, 3:52 PM

## 2013-04-10 NOTE — Progress Notes (Signed)
Adult Psychoeducational Group Note  Date:  04/10/2013 Time:  3:15PM  Group Topic/Focus:  Making Healthy Choices:   The focus of this group is to help patients identify negative/unhealthy choices they were using prior to admission and identify positive/healthier coping strategies to replace them upon discharge.  Participation Level:  Active  Participation Quality:  Attentive  Affect:  Flat  Cognitive:  Appropriate  Insight: Appropriate  Engagement in Group:  Engaged  Modes of Intervention:  Discussion  Additional Comments:  Pt was active in the group discussion as Staff discussed personal support systems. Pt indicated that his higher power was his main support system.   Zacarias Pontes R 04/10/2013, 4:04 PM

## 2013-04-10 NOTE — [facility] Group Notes (Signed)
[facility] Group Notes:  (Nursing/MHT/Case Management/Adjunct)  Date:  04/10/2013  Time:  10:44 AM  Type of Therapy: Psychoeducational Skills   Participation Level: Active   Participation Quality: Appropriate   Affect: Appropriate   Cognitive: Appropriate   Insight: Appropriate   Engagement in Group: Engaged   Modes of Intervention: Problem-solving   Summary of Progress/Problems: Pt attended [employer] group, and engaged in treatment.    Jacquelyne Balint Shanta 04/10/2013, 10:44 AM

## 2013-04-10 NOTE — [facility] Group Notes (Signed)
[facility] Group Notes:  (Nursing/MHT/Case Management/Adjunct)  Date:  04/10/2013  Time:  2:45 PM  Type of Therapy:  Nurse Education  Participation Level:  None  Participation Quality:  Drowsy  Affect:    Cognitive:    Insight:    Engagement in Group:    Modes of Intervention:    Summary of Progress/Problems:  Cresenciano Lick 04/10/2013, 2:45 PM

## 2013-04-10 NOTE — Progress Notes (Signed)
Patient ID: [name], male   DOB: [birth year], 42 y.o.   MRN: [id number]  D: Pt has been very flat and depressed on the unit today. Pt has also continued to complain of severe anxiety. Pt attended morning group and reported that he was severely depressed and that on a scale of 1-10 he was a 10 which is being the worst. Pt reported that his major stressor was no family communication, he reported that he is wanting a family session so that he may be able to talk to him. Pt was referred to the weekend counselor, so that it could be noted that he was requesting a family session. This Clinical research associate spoke to Dr. Dub Mikes regarding his issues and complaints regarding sleep and anxiety. Dr. Dub Mikes ordered Saphris and Trazodone for sleep. Pt reported being positive SI, however contracts for safety. A: 15 min checks continued for patient safety. R: Pts safety maintained.

## 2013-04-11 DIAGNOSIS — F1410 Cocaine abuse, uncomplicated: Secondary | ICD-10-CM

## 2013-04-11 MED ORDER — ALBUTEROL SULFATE HFA 108 (90 BASE) MCG/ACT IN AERS
INHALATION_SPRAY | RESPIRATORY_TRACT | Status: AC
  Administered 2013-04-11: 2 via RESPIRATORY_TRACT
  Filled 2013-04-11: qty 6.7

## 2013-04-11 MED ORDER — ALBUTEROL SULFATE HFA 108 (90 BASE) MCG/ACT IN AERS: 2.0000 | INHALATION_SPRAY | Freq: Four times a day (QID) | RESPIRATORY_TRACT | Status: DC | PRN

## 2013-04-11 MED ORDER — HYDROXYZINE HCL 25 MG PO TABS
25.0000 mg | ORAL_TABLET | Freq: Four times a day (QID) | ORAL | Status: DC | PRN
  Administered 2013-04-11: 25 mg via ORAL
  Filled 2013-04-11: qty 16

## 2013-04-11 NOTE — Progress Notes (Signed)
Adult Psychoeducational Group Note  Date:  04/11/2013 Time:  11:00AM Group Topic/Focus:  Building Self Esteem:   The Focus of this group is helping patients become aware of the effects of self-esteem on their lives, the things they and others do that enhance or undermine their self-esteem, seeing the relationship between their level of self-esteem and the choices they make and learning ways to enhance self-esteem.  Participation Level:  Did Not Attend   Additional Comments: Pt. Didn't attend.   Bing Plume D 04/11/2013, 12:49 PM

## 2013-04-11 NOTE — [facility] Group Notes (Signed)
[facility] LCSW Group Therapy  04/11/2013 4:20 PM  Type of Therapy:  Group Therapy  Participation Level:  Minimal  Participation Quality:  Inattentive and Resistant  Affect:  Blunted  Cognitive:  Lacking  Insight:  Lacking  Engagement in Therapy:  Limited  Modes of Intervention:  Discussion, Education, Exploration, Socialization and Support  Summary of Progress/Problems: Today's Topic: Overcoming Obstacles. Pt identified obstacles faced currently and processed barriers involved in overcoming these obstacles. Pt identified steps necessary for overcoming these obstacles and explored motivation (internal and external) for facing these difficulties head on. Pt further identified one area of concern in their lives and chose a skill of focus pulled from their "toolbox." [name] stated that his main obstacle involves the fact that "my parents won't let me come home even though I've paid my rent for the month." [name] demonstrates a lack of insight regarding his addiction and mental health issues demonstrated by his inability to acknowledge the benefits of SA and Mental health treatment. [name] presented with blunted affect and was resistant to participating in group conversation. He does not appear to be making progress in the group setting at this time.    [name], Marcus Nixon 04/11/2013, 4:20 PM
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
## 2013-04-11 NOTE — Progress Notes (Signed)
Pt rated his depression and hopelessness a 7 and his anxiety 8 on his self-inventory.  He denies any S/H ideation or A/V/H.  He is homeless and is hoping to get into an [facility] from here.  Case manager gave list [facility].  Pt has not attempted to follow-up with anything related to his discharge.

## 2013-04-11 NOTE — [facility] Group Notes (Signed)
[facility] LCSW Aftercare Discharge Planning Group Note   04/11/2013 10:12 AM  Participation Quality:  Minimal  Mood/Affect:  Blunted  Depression Rating:  7  Anxiety Rating:  8  Thoughts of Suicide:  No Will you contract for safety?   NA  Current AVH:  No  Plan for Discharge/Comments:  Pt stated that he is not interestd in anything but "getting out of my parents' home." He has [facility] date for 8/25. Pt follows up with [facility]. Pt given [employer] and stated that he did not make any call this weekend. Pt told about [employer]. Pt reporting no motivation to "go anywhere or do anything." CSW informed pt that pt's sister called this morning and is interested in learning more about the [facility] being that pt cannot return home. Pt uninterested and resistant to speaking.   Transportation Means: sister  Supports: Occupational hygienist, Research scientist (physical sciences)

## 2013-04-11 NOTE — Progress Notes (Signed)
Adult Psychoeducational Group Note  Date:  04/11/2013 Time:  10:50 PM  Group Topic/Focus:  Goals Group:   The focus of this group is to help patients establish daily goals to achieve during treatment and discuss how the patient can incorporate goal setting into their daily lives to aide in recovery.  Participation Level:  Active  Participation Quality:  Appropriate  Affect:  Appropriate  Cognitive:  Appropriate  Insight: Appropriate  Engagement in Group:  Engaged  Modes of Intervention:  Discussion  Additional Comments:  Pt stated that he had a good day and he is thankful that has had a place to come and get better.  Terie Purser R 04/11/2013, 10:50 PM

## 2013-04-11 NOTE — Progress Notes (Signed)
Patient ID: [name], male   DOB: 04-02-1971, 42 y.o.   MRN: [id number]  D: Pt denies SI/HI/AVH. Pt is pleasant and cooperative. Pt states he was having anxiety and PA Spencer ordered Vistaril 25mg  Q6. Pt appears flat and depressed.   A: Pt was offered support and encouragement. Pt was given scheduled medications. Pt was encourage to attend groups. Q 15 minute checks were done for safety.   R:Pt attends groups and interacts well with peers and staff. Pt is taking medication. Pt has no complaints at this time.Pt receptive to treatment and safety maintained on unit.

## 2013-04-12 DIAGNOSIS — F1994 Other psychoactive substance use, unspecified with psychoactive substance-induced mood disorder: Secondary | ICD-10-CM

## 2013-04-12 MED ORDER — TRAZODONE HCL 100 MG PO TABS: 100.0000 mg | ORAL_TABLET | Freq: Every evening | ORAL | Status: DC | PRN

## 2013-04-12 MED ORDER — ASENAPINE MALEATE 5 MG SL SUBL: 5.0000 mg | SUBLINGUAL_TABLET | Freq: Two times a day (BID) | SUBLINGUAL | Status: DC

## 2013-04-12 MED ORDER — ACETAMINOPHEN 500 MG PO TABS: 1000.0000 mg | ORAL_TABLET | Freq: Four times a day (QID) | ORAL | Status: DC | PRN

## 2013-04-12 MED ORDER — LISINOPRIL 20 MG PO TABS: 20.0000 mg | ORAL_TABLET | Freq: Every day | ORAL | Status: DC

## 2013-04-12 MED ORDER — HYDROXYZINE HCL 25 MG PO TABS: 25.0000 mg | ORAL_TABLET | Freq: Four times a day (QID) | ORAL | Status: DC | PRN

## 2013-04-12 MED ORDER — ALBUTEROL SULFATE HFA 108 (90 BASE) MCG/ACT IN AERS: 2.0000 | INHALATION_SPRAY | Freq: Four times a day (QID) | RESPIRATORY_TRACT | Status: DC | PRN

## 2013-04-12 NOTE — Progress Notes (Signed)
Patient did not attend 0900 hr RN group. 

## 2013-04-12 NOTE — Progress Notes (Deleted)
The focus of this group is to educate the patient on the purpose and policies of crisis stabilization and provide a format to answer questions about their admission.  The group details unit policies and expectations of patients while admitted. Patient was engaging and participated
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
## 2013-04-12 NOTE — Progress Notes (Signed)
Patient ID: [name], male   DOB: [birth year], 42 y.o.   MRN: [id number] Patient currently asleep; no s/s of distress noted at this time. Respirations regular and unlabored.

## 2013-04-12 NOTE — Progress Notes (Signed)
[facility] Adult Case Management Discharge Plan :  Will you be returning to the same living situation after discharge: No.Will live in hotel until admission date. At discharge, do you have transportation home?:Yes,  ACT team will pick up pt Do you have the ability to pay for your medications:Yes,  Medicare/medicaid  Release of information consent forms completed and in the chart;  Patient's signature needed at discharge.  Patient to Follow up at: Follow-up Information   Follow up with [facility]. (Call Thayer Ohm when you discharge to schedule visit for hospital followup. )    Contact information:   [address] [place], [place] [address] phone: [phone] fax: [phone]      Follow up with [facility] On 04/25/2013. (Bring ID, 30 day medication supply, and clothing. Make sure to arrive at facility by 8AM)    Contact information:   [address] [place], [place] [address] phone: [phone] fax: [phone]      Patient denies SI/HI:   Yes,  during group and self report    Safety Planning and Suicide Prevention discussed:  Yes,  SPE completed with pt's sister. Pt given SPI pamphlet and encouraged to ask questions and talk about concerns relating to SPE.  Smart, Xin Klawitter 04/12/2013, 9:43 AM

## 2013-04-12 NOTE — Consult Note (Signed)
Agree with assessment and plan Alexandru Moorer A. Emojean Gertz, M.D. 

## 2013-04-12 NOTE — [facility] Suicide Risk Assessment (Signed)
[facility] INPATIENT:  Family/Significant Other Suicide Prevention Education  Suicide Prevention Education:  Education Completed; [name] (pt's sister) has been identified by the patient as the family member/significant other with whom the patient will be residing, and identified as the person(s) who will aid the patient in the event of a mental health crisis (suicidal ideations/suicide attempt).  With written consent from the patient, the family member/significant other has been provided the following suicide prevention education, prior to the and/or following the discharge of the patient.  The suicide prevention education provided includes the following:  Suicide risk factors  Suicide prevention and interventions  National Suicide Hotline telephone number  [facility] assessment telephone number  [facility] Emergency Assistance 911  [facility] and/or Residential Mobile Crisis Unit telephone number  Request made of family/significant other to:  Remove weapons (e.g., guns, rifles, knives), all items previously/currently identified as safety concern.    Remove drugs/medications (over-the-counter, prescriptions, illicit drugs), all items previously/currently identified as a safety concern.  The family member/significant other verbalizes understanding of the suicide prevention education information provided.  The family member/significant other agrees to remove the items of safety concern listed above.  [name], [name] 04/12/2013, 8:20 AM

## 2013-04-12 NOTE — [facility] Suicide Risk Assessment (Signed)
Suicide Risk Assessment  Discharge Assessment     Demographic Factors:  Male and Unemployed  Mental Status Per Nursing Assessment::   On Admission:  Suicidal ideation indicated by patient  Current Mental Status by Physician: In full contact with reality. There are no suicidal ideas, plans or intent. His mood is euthymic, her affect is restricted. He states the [facility] is helping. He is going into a motel for the next week while waiting for his bed to be available at [facility]. He is committed to abstinence. The ACT team is going to follow up with him very closely   Loss Factors: NA  Historical Factors: NA  Risk Reduction Factors:   Positive social support  Continued Clinical Symptoms: Alcohol and Drug Abuse/Dependece, PTSD, Depression   Cognitive Features That Contribute To Risk:  Closed-mindedness Polarized thinking Thought constriction (tunnel vision)    Suicide Risk:  Minimal: No identifiable suicidal ideation.  Patients presenting with no risk factors but with morbid ruminations; may be classified as minimal risk based on the severity of the depressive symptoms  Discharge Diagnoses:   AXIS I:  Cocaine Dependence Alcohol Abuse, Depressive Disorder NOS, PTSD AXIS II:  Deferred AXIS III:   Past Medical History  Diagnosis Date  . Anxiety   . Bipolar 1 disorder   . Asthma   . Hypertension   . Depression   . PTSD (post-traumatic stress disorder)    AXIS IV:  housing problems and other psychosocial or environmental problems AXIS V:  51-60 moderate symptoms  Plan Of Care/Follow-up recommendations:  Activity:  as tolerated Diet:  regular Follow up ACT team and [facility] Is patient on multiple antipsychotic therapies at discharge:  No   Has Patient had three or more failed trials of antipsychotic monotherapy by history:  No  Recommended Plan for Multiple Antipsychotic Therapies: N/A   Karell Tukes A 04/12/2013, 11:29 AM

## 2013-04-12 NOTE — Progress Notes (Signed)
Patient ID: [name], male   DOB: 1970-11-25, 42 y.o.   MRN: [id number] Patient discharged per physician order; patient denies SI/HI and A/V hallucinations;patient received samples, prescriptions, and copy of AVS, and fall precaution worksheet; patient had no other questions or concerns at this time; patient signed and verbalized that he received all belongings; patient left the unit ambulatory

## 2013-04-13 NOTE — Progress Notes (Signed)
Patient Discharge Instructions:  After Visit Summary (AVS):   Faxed to:  04/13/13 Discharge Summary Note:   Faxed to:  04/13/13 Psychiatric Admission Assessment Note:   Faxed to:  04/13/13 Suicide Risk Assessment - Discharge Assessment:   Faxed to:  04/13/13 Faxed/Sent to the Next Level Care provider:  04/13/13 Faxed to [facility] @ [phone] Faxed to [facility] @ [phone]  Jerelene Redden, 04/13/2013, 3:44 PM

## 2013-04-25 ENCOUNTER — Encounter (HOSPITAL_COMMUNITY): Payer: Self-pay | Admitting: Emergency Medicine

## 2013-04-25 ENCOUNTER — Emergency Department (HOSPITAL_COMMUNITY)
Admission: EM | Admit: 2013-04-25 | Discharge: 2013-04-25 | Disposition: A | Discharge status: Other Acute Inpatient Hospital | Payer: Medicare Other | Source: Home / Self Care | Attending: Emergency Medicine | Admitting: Emergency Medicine

## 2013-04-25 DIAGNOSIS — F39 Unspecified mood [affective] disorder: Secondary | ICD-10-CM

## 2013-04-25 LAB — CBC WITH DIFFERENTIAL/PLATELET
Basophils Absolute: 0 10*3/uL (ref 0.0–0.1)
HCT: 37.8 % — ABNORMAL LOW (ref 39.0–52.0)
Hemoglobin: 13 g/dL (ref 13.0–17.0)
Lymphocytes Relative: 35 % (ref 12–46)
Lymphs Abs: 2 10*3/uL (ref 0.7–4.0)
MCV: 79.7 fL (ref 78.0–100.0)
Monocytes Absolute: 0.6 10*3/uL (ref 0.1–1.0)
Monocytes Relative: 11 % (ref 3–12)
Neutro Abs: 2.8 10*3/uL (ref 1.7–7.7)
RBC: 4.74 MIL/uL (ref 4.22–5.81)
RDW: 14.6 % (ref 11.5–15.5)
WBC: 5.7 10*3/uL (ref 4.0–10.5)

## 2013-04-25 LAB — URINALYSIS, ROUTINE W REFLEX MICROSCOPIC
Hgb urine dipstick: NEGATIVE
Nitrite: NEGATIVE
Specific Gravity, Urine: 1.009 (ref 1.005–1.030)
Urobilinogen, UA: 0.2 mg/dL (ref 0.0–1.0)
pH: 7 (ref 5.0–8.0)

## 2013-04-25 LAB — COMPREHENSIVE METABOLIC PANEL
AST: 18 U/L (ref 0–37)
CO2: 27 mEq/L (ref 19–32)
Calcium: 8.9 mg/dL (ref 8.4–10.5)
Creatinine, Ser: 0.91 mg/dL (ref 0.50–1.35)
GFR calc Af Amer: >90 mL/min (ref >90)
GFR calc non Af Amer: >90 mL/min (ref >90)
Total Protein: 6.9 g/dL (ref 6.0–8.3)

## 2013-04-25 LAB — RAPID URINE DRUG SCREEN, HOSP PERFORMED
Amphetamines: NOT DETECTED
Barbiturates: NOT DETECTED
Benzodiazepines: NOT DETECTED
Cocaine: NOT DETECTED

## 2013-04-25 MED ORDER — TRAZODONE HCL 50 MG PO TABS: 100.0000 mg | ORAL_TABLET | Freq: Every evening | ORAL | Status: DC | PRN

## 2013-04-25 MED ORDER — ONDANSETRON HCL 4 MG PO TABS: 4.0000 mg | ORAL_TABLET | Freq: Three times a day (TID) | ORAL | Status: DC | PRN

## 2013-04-25 MED ORDER — LORAZEPAM 1 MG PO TABS: 1.0000 mg | ORAL_TABLET | Freq: Three times a day (TID) | ORAL | Status: DC | PRN

## 2013-04-25 MED ORDER — ZOLPIDEM TARTRATE 5 MG PO TABS: 5.0000 mg | ORAL_TABLET | Freq: Every evening | ORAL | Status: DC | PRN

## 2013-04-25 MED ORDER — LORAZEPAM 1 MG PO TABS
2.0000 mg | ORAL_TABLET | Freq: Once | ORAL | Status: AC
  Administered 2013-04-25: 2 mg via ORAL
  Filled 2013-04-25: qty 2

## 2013-04-25 MED ORDER — NICOTINE 21 MG/24HR TD PT24: 21.0000 mg | MEDICATED_PATCH | Freq: Every day | TRANSDERMAL | Status: DC

## 2013-04-25 MED ORDER — LISINOPRIL 20 MG PO TABS
20.0000 mg | ORAL_TABLET | Freq: Every day | ORAL | Status: DC
  Administered 2013-04-25: 20 mg via ORAL
  Filled 2013-04-25: qty 1

## 2013-04-25 MED ORDER — IBUPROFEN 400 MG PO TABS: 600.0000 mg | ORAL_TABLET | Freq: Three times a day (TID) | ORAL | Status: DC | PRN
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
## 2013-04-25 NOTE — Progress Notes (Signed)
[name],MHT received report from [name]) counselor at [name] that patient has been accepted for treatment run by [name] Bed [phone] to Dr. [name]. Writer completed support paperwork with patient, faxed to [name] and informed [name]) who will arrange for transport via security with sitter.

## 2013-04-25 NOTE — ED Notes (Signed)
Awaiting telepsych consult

## 2013-04-25 NOTE — ED Notes (Signed)
Updated pt. On plan of care, Pt. Verbalized understanding
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
## 2013-04-25 NOTE — ED Notes (Signed)
Pt accepted at [facility] 

## 2013-04-25 NOTE — ED Provider Notes (Signed)
CSN: [id number]     Arrival date & time 04/25/13  1130 History     First MD Initiated Contact with Patient 04/25/13 1159     Chief Complaint  Patient presents with  . Atrial Fibrillation  . Suicidal   (Consider location/radiation/quality/duration/timing/severity/associated sxs/prior Treatment) HPI Comments: Pt comes in with cc of palpitations and suicidal ideations. Pt has hx of afib, not on any anticoagulants or rate control. States that he was diagnosed several years ago. Also states that he has hx of anxiety, and has run out of his valium. Pt was at a bus stop when his sx started. He also states that he has SI, his plan would be to jump in front of moving traffic. Admit to cocain use, last use 2 weeks ago.  Pt is noted to be in sinus rhythms here, no tachycardia and with no chest pain, dib.  Patient is a 42 y.o. male presenting with atrial fibrillation. The history is provided by the patient.  Atrial Fibrillation Pertinent negatives include no chest pain, no abdominal pain and no shortness of breath.    Past Medical History  Diagnosis Date  . Anxiety   . Bipolar 1 disorder   . Asthma   . Hypertension   . Depression   . PTSD (post-traumatic stress disorder)    History reviewed. No pertinent past surgical history. No family history on file. History  Substance Use Topics  . Smoking status: Never Smoker   . Smokeless tobacco: Never Used  . Alcohol Use: 3.6 oz/week    1 Glasses of wine, 5 Cans of beer, 0 Shots of liquor, 0 Drinks containing 0.5 oz of alcohol per week     Comment: binger on etoh and cocaine    Review of Systems  Constitutional: Negative for activity change and appetite change.  Respiratory: Negative for cough and shortness of breath.   Cardiovascular: Positive for palpitations. Negative for chest pain.  Gastrointestinal: Negative for abdominal pain.  Genitourinary: Negative for dysuria.  Neurological: Negative for dizziness and syncope.    Allergies   Review of patient's allergies indicates no known allergies.  Home Medications   Current Outpatient Rx  Name  Route  Sig  Dispense  Refill  . acetaminophen (TYLENOL) 500 MG tablet   Oral   Take 2 tablets (1,000 mg total) by mouth every 6 (six) hours as needed for pain.   30 tablet   0   . albuterol (PROVENTIL HFA;VENTOLIN HFA) 108 (90 BASE) MCG/ACT inhaler   Inhalation   Inhale 2 puffs into the lungs every 6 (six) hours as needed for wheezing or shortness of breath.         Marland Kitchen aspirin 325 MG tablet   Oral   Take 650 mg by mouth daily as needed for pain.         . hydrOXYzine (ATARAX/VISTARIL) 25 MG tablet   Oral   Take 1 tablet (25 mg total) by mouth every 6 (six) hours as needed for anxiety.   60 tablet   0   . lisinopril (PRINIVIL,ZESTRIL) 20 MG tablet   Oral   Take 1 tablet (20 mg total) by mouth daily. For hypertension control   30 tablet   0   . traZODone (DESYREL) 100 MG tablet   Oral   Take 1 tablet (100 mg total) by mouth at bedtime as needed for sleep.   30 tablet   0   . asenapine (SAPHRIS) 5 MG SUBL 24 hr tablet   Sublingual  Place 1 tablet (5 mg total) under the tongue 2 (two) times daily. For mood control   60 tablet   0   . diazepam (VALIUM) 2 MG tablet   Oral   Take 2 mg by mouth daily.          BP 132/85  Pulse 66  Resp 0  SpO2 100% Physical Exam  Nursing note and vitals reviewed. Constitutional: He is oriented to person, place, and time. He appears well-developed.  HENT:  Head: Normocephalic and atraumatic.  Eyes: Conjunctivae and EOM are normal. Pupils are equal, round, and reactive to light.  Neck: Normal range of motion. Neck supple.  Cardiovascular: Normal rate and regular rhythm.   Pulmonary/Chest: Effort normal and breath sounds normal.  Abdominal: Soft. Bowel sounds are normal. He exhibits no distension. There is no tenderness. There is no rebound and no guarding.  Neurological: He is alert and oriented to person, place,  and time.  Skin: Skin is warm.    ED Course   Procedures (including critical care time)  Labs Reviewed  CBC WITH DIFFERENTIAL - Abnormal; Notable for the following:    HCT 37.8 (*)    All other components within normal limits  COMPREHENSIVE METABOLIC PANEL - Abnormal; Notable for the following:    BUN 5 (*)    Albumin 3.4 (*)    All other components within normal limits  TROPONIN I  URINE RAPID DRUG SCREEN (HOSP PERFORMED)  URINALYSIS, ROUTINE W REFLEX MICROSCOPIC   No results found. No diagnosis found.  MDM   Date: 04/25/2013  Rate: 70  Rhythm: normal sinus rhythm  QRS Axis: normal  Intervals: normal  ST/T Wave abnormalities: normal  Conduction Disutrbances: none  Narrative Interpretation: unremarkable  Pt comes in with palpitations and SI. Hx of cocain use and anxiety, and afib. Sinus rhythm here. No need to start rate control or anticoagulation. Cocaine use in the past, UDS is negative. SI - will have ACT team evaluate.     Derwood Kaplan, MD 04/25/13 1418

## 2013-04-25 NOTE — ED Notes (Signed)
Pt current online with telepsych.

## 2013-04-25 NOTE — ED Provider Notes (Signed)
11:29 PM patient accepted to [facility]. Will transfer, Dr. Rainey Pines accepting.   Audree Camel, MD 04/25/13 2329

## 2013-04-25 NOTE — ED Notes (Signed)
Pt picked up by ems from bus stop for irregular HR.  Pt converted to NSR upon EMS arrival without intervention.  Pt stated he's feeling suicidal.  He was supposed to go to [facility] this morning at 0800 but did not have transportation.  He began having anxiety and then irregular HR and suicide ideation.  Hx anxiety, SI, irregular HR, self harm. Pt able to contract for safety.

## 2013-04-26 ENCOUNTER — Encounter (HOSPITAL_COMMUNITY): Payer: Self-pay | Admitting: *Deleted

## 2013-04-26 ENCOUNTER — Inpatient Hospital Stay (HOSPITAL_COMMUNITY)
Admission: AD | Admit: 2013-04-26 | Discharge: 2013-04-28 | DRG: 897 | Disposition: A | Discharge status: Home / Self Care | Payer: Medicare Other | Source: Intra-hospital | Attending: Psychiatry | Admitting: Psychiatry

## 2013-04-26 DIAGNOSIS — F411 Generalized anxiety disorder: Secondary | ICD-10-CM | POA: Diagnosis present

## 2013-04-26 DIAGNOSIS — F1010 Alcohol abuse, uncomplicated: Principal | ICD-10-CM | POA: Diagnosis present

## 2013-04-26 DIAGNOSIS — I10 Essential (primary) hypertension: Secondary | ICD-10-CM | POA: Diagnosis present

## 2013-04-26 DIAGNOSIS — F419 Anxiety disorder, unspecified: Secondary | ICD-10-CM

## 2013-04-26 DIAGNOSIS — F1420 Cocaine dependence, uncomplicated: Secondary | ICD-10-CM | POA: Diagnosis present

## 2013-04-26 DIAGNOSIS — F319 Bipolar disorder, unspecified: Secondary | ICD-10-CM | POA: Diagnosis present

## 2013-04-26 DIAGNOSIS — R002 Palpitations: Secondary | ICD-10-CM | POA: Diagnosis present

## 2013-04-26 DIAGNOSIS — F329 Major depressive disorder, single episode, unspecified: Secondary | ICD-10-CM | POA: Diagnosis present

## 2013-04-26 DIAGNOSIS — F1920 Other psychoactive substance dependence, uncomplicated: Secondary | ICD-10-CM

## 2013-04-26 DIAGNOSIS — J45909 Unspecified asthma, uncomplicated: Secondary | ICD-10-CM | POA: Diagnosis present

## 2013-04-26 DIAGNOSIS — F4310 Post-traumatic stress disorder, unspecified: Secondary | ICD-10-CM | POA: Diagnosis present

## 2013-04-26 DIAGNOSIS — Z79899 Other long term (current) drug therapy: Secondary | ICD-10-CM

## 2013-04-26 HISTORY — DX: Palpitations: R00.2

## 2013-04-26 MED ORDER — LISINOPRIL 20 MG PO TABS
20.0000 mg | ORAL_TABLET | Freq: Every day | ORAL | Status: DC
  Administered 2013-04-26 – 2013-04-27 (×2): 20 mg via ORAL
  Filled 2013-04-26 (×4): qty 1

## 2013-04-26 MED ORDER — ASENAPINE MALEATE 5 MG SL SUBL
5.0000 mg | SUBLINGUAL_TABLET | Freq: Two times a day (BID) | SUBLINGUAL | Status: DC
  Administered 2013-04-26 – 2013-04-27 (×4): 5 mg via SUBLINGUAL
  Filled 2013-04-26 (×7): qty 1

## 2013-04-26 MED ORDER — TRAZODONE HCL 100 MG PO TABS
100.0000 mg | ORAL_TABLET | Freq: Every evening | ORAL | Status: DC | PRN
  Administered 2013-04-26 – 2013-04-27 (×2): 100 mg via ORAL
  Filled 2013-04-26: qty 1
  Filled 2013-04-26: qty 14
  Filled 2013-04-26: qty 1

## 2013-04-26 MED ORDER — MAGNESIUM HYDROXIDE 400 MG/5ML PO SUSP: 30.0000 mL | Freq: Every day | ORAL | Status: DC | PRN

## 2013-04-26 MED ORDER — HYDROXYZINE HCL 25 MG PO TABS
25.0000 mg | ORAL_TABLET | Freq: Four times a day (QID) | ORAL | Status: DC | PRN
  Administered 2013-04-26: 25 mg via ORAL
  Filled 2013-04-26: qty 30

## 2013-04-26 MED ORDER — ALBUTEROL SULFATE HFA 108 (90 BASE) MCG/ACT IN AERS: 2.0000 | INHALATION_SPRAY | Freq: Four times a day (QID) | RESPIRATORY_TRACT | Status: DC | PRN

## 2013-04-26 MED ORDER — PNEUMOCOCCAL VAC POLYVALENT 25 MCG/0.5ML IJ INJ
0.5000 mL | INJECTION | INTRAMUSCULAR | Status: AC
  Administered 2013-04-26: 0.5 mL via INTRAMUSCULAR

## 2013-04-26 MED ORDER — ALUM & MAG HYDROXIDE-SIMETH 200-200-20 MG/5ML PO SUSP: 30.0000 mL | ORAL | Status: DC | PRN

## 2013-04-26 MED ORDER — ACETAMINOPHEN 325 MG PO TABS
650.0000 mg | ORAL_TABLET | Freq: Four times a day (QID) | ORAL | Status: DC | PRN
  Administered 2013-04-27: 650 mg via ORAL

## 2013-04-26 NOTE — Progress Notes (Signed)
D: Patient in the bed on first approach.  Patient states he went to some of the groups today.  Patient states he is trying to get his life back on track.  Patient states he hopes to get his life back on track.  Patient denies SI/HI and denies AVH.   A: Staff to monitor Q 15 mins for safety.  Encouragement and support offered.  Scheduled medications administered per orders. R: Patient remains safe on the unit.  Patient did not attend group tonight but did come in the dayroom for snack.  Patient visible on the unit and interacting with peers.  Patient taking administered medications.

## 2013-04-26 NOTE — Progress Notes (Signed)
Adult Psychoeducational Group Note  Date:  04/26/2013 Time:  12:38 PM  Group Topic/Focus:  Recovery Goals:   The focus of this group is to identify appropriate goals for recovery and establish a plan to achieve them.  Participation Level:  Did Not Attend   [name] 04/26/2013, 12:38 PM
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
## 2013-04-26 NOTE — Progress Notes (Signed)
Didn't attend group 

## 2013-04-26 NOTE — Progress Notes (Signed)
Recreation Therapy Notes  Date: 08.26.2014 Time: 2:45pm Location: [address] Dayroom  Group Topic: Animal Assisted Activities (AAA)  Goal Area(s) Addresses:  Patient will interact appropriately with dog team.    Behavioral Response: Did not attend. Patient consent form indicates patient has allergy to animals. Due to allergy patient chose not to participate in AAA sessions.   Marykay Lex Shaela Boer, LRT/CTRS  Jearl Klinefelter 04/26/2013 4:52 PM

## 2013-04-26 NOTE — Tx Team (Signed)
Initial Interdisciplinary Treatment Plan  PATIENT STRENGTHS: (choose at least two) Ability for insight Active sense of humor Average or above average intelligence Capable of independent living Communication skills General fund of knowledge Motivation for treatment/growth Physical Health Religious Affiliation Special hobby/interest Work skills  PATIENT STRESSORS: Financial difficulties Marital or family conflict Medication change or noncompliance Substance abuse   PROBLEM LIST: Problem List/Patient Goals Date to be addressed Date deferred Reason deferred Estimated date of resolution  ""Tried to get back on re certification on the [facility] 04/26/13     "Trying to get my depression down' 04/26/13           Depression 04/26/13     Suicidal Ideation 04/26/13     Substance abuse 04/26/13                        DISCHARGE CRITERIA:  Ability to meet basic life and health needs Adequate post-discharge living arrangements Improved stabilization in mood, thinking, and/or behavior Medical problems require only outpatient monitoring Motivation to continue treatment in a less acute level of care Need for constant or close observation no longer present Reduction of life-threatening or endangering symptoms to within safe limits Safe-care adequate arrangements made Verbal commitment to aftercare and medication compliance  PRELIMINARY DISCHARGE PLAN: Outpatient therapy Participate in family therapy Placement in alternative living arrangements  PATIENT/FAMIILY INVOLVEMENT: This treatment plan has been presented to and reviewed with the patient, [name], and/or family member.  The patient and family have been given the opportunity to ask questions and make suggestions.  [name] 04/26/2013, 1:34 AM
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
## 2013-04-26 NOTE — [facility] Counselor (Signed)
Adult Psychosocial Assessment Update Interdisciplinary Team  Previous [facility] admissions/discharges:  Admissions Discharges  Date:04/07/11 Date:  04/12/13  Date: Date:  Date: Date:  Date: Date:  Date: Date:   Changes since the last Psychosocial Assessment (including adherence to outpatient mental health and/or substance abuse treatment, situational issues contributing to decompensation and/or relapse). Patient reports discharging from hospital recently with plans to admit to [facility].  He reports not making appointment at [facility] and becoming increasingly depressed and suicidal.  He reports he did not relapse on alcohol or drugs.             Discharge Plan 1. Will you be returning to the same living situation after discharge?   Yes: No:      If no, what is your plan?    No.  Patient reports being homeless.       2. Would you like a referral for services when you are discharged? Yes:     If yes, for what services?  No:       Yes, patient is requesting to be referred to [facility].  He reports he has been considering selling his computer to buy drugs.       Summary and Recommendations (to be completed by the evaluator) [name] is a 42 years old African American male admitted with Mood Disorder.  He will benefit from crisis stabilization, evaluation for medication, psycho-education groups for coping skills development, group therapy and case management for discharge planning.                        Signature:  Wynn Banker, 04/26/2013 9:26 AM
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
## 2013-04-26 NOTE — Progress Notes (Signed)
The focus of this group is to educate the patient on the purpose and policies of crisis stabilization and provide a format to answer questions about their admission.  The group details unit policies and expectations of patients while admitted.  Patient attended 0900 nurse education orientation group this morning.  Patient actively participated, appropriate affect, alert, appropriate insight and engagement.  Patient will work on goals for discharge today. 

## 2013-04-26 NOTE — [facility] Group Notes (Signed)
[facility] LCSW Group Therapy      Feelings About Diagnosis 1:15 - 2:30 PM         04/26/2013  12:53 PM    Type of Therapy:  Group Therapy  Participation Level:  Active  Participation Quality:  Appropriate  Affect:  Appropriate  Cognitive:  Alert and Appropriate  Insight:  Developing/Improving and Engaged  Engagement in Therapy:  Developing/Improving and Engaged  Modes of Intervention:  Discussion, Education, Exploration, Problem-Solving, Rapport Building, Support  Summary of Progress/Problems:  Patient actively participated in group. Patient discussed past and present diagnosis and the effects it has had on  life.  Patient talked about family and society being judgmental and the stigma associated with having a mental health diagnosis.  Wynn Banker 04/26/2013  12:53 PM

## 2013-04-26 NOTE — Progress Notes (Signed)
Patient ID: [name], male   DOB: 23-Jun-1971, 42 y.o.   MRN: [id number]  Pt was pleasant and for the most part cooperative during the adm process. Initially pt answered questions appropriately, however once it was determined that the pt's ID card was not in his property, pt became preoccupied. Pt began relating all questions to his ID card, even after being assure by the writer that the card would be located.  Eventually pt informed the writer that since his last DC from [facility] on or about Aug 14, he'd been staying in an extended stay hotel. Pt was to report to [facility] Monday, Aug 25th.  According to the pt, was supposed to have been followed by an ACT team, but stated they wouldn't return any of his calls. Stated that once he finally reached them he was told to ask his sister if she could take him. When he informed them she couldn't because of her children, pt stated that he was informed that they (ACT) could take him to a shelter since he missed the time for check in.  Pt stated he called his mother and informed her of the oversight and was called a Sales promotion account executive. Stated his mother began calling him names, which led to his feelings of wanted to hurt himself. Pt denied SI during the adm.

## 2013-04-27 DIAGNOSIS — F4310 Post-traumatic stress disorder, unspecified: Secondary | ICD-10-CM

## 2013-04-27 DIAGNOSIS — F1010 Alcohol abuse, uncomplicated: Principal | ICD-10-CM

## 2013-04-27 DIAGNOSIS — F329 Major depressive disorder, single episode, unspecified: Secondary | ICD-10-CM

## 2013-04-27 DIAGNOSIS — F1420 Cocaine dependence, uncomplicated: Secondary | ICD-10-CM

## 2013-04-27 MED ORDER — HYDROXYZINE HCL 25 MG PO TABS: 25.0000 mg | ORAL_TABLET | Freq: Four times a day (QID) | ORAL | Status: DC | PRN

## 2013-04-27 MED ORDER — ASENAPINE MALEATE 5 MG SL SUBL: 5.0000 mg | SUBLINGUAL_TABLET | Freq: Two times a day (BID) | SUBLINGUAL | Status: DC

## 2013-04-27 MED ORDER — TRAZODONE HCL 100 MG PO TABS: 100.0000 mg | ORAL_TABLET | Freq: Every evening | ORAL | Status: DC | PRN

## 2013-04-27 MED ORDER — LISINOPRIL 20 MG PO TABS: 20.0000 mg | ORAL_TABLET | Freq: Every day | ORAL | Status: DC

## 2013-04-27 MED ORDER — ALBUTEROL SULFATE HFA 108 (90 BASE) MCG/ACT IN AERS: 2.0000 | INHALATION_SPRAY | Freq: Four times a day (QID) | RESPIRATORY_TRACT | Status: DC | PRN

## 2013-04-27 NOTE — Progress Notes (Signed)
Adult Psychoeducational Group Note  Date:  04/27/2013 Time:  11:00AM Group Topic/Focus:  Personal Development  Participation Level:  Active  Participation Quality:  Appropriate and Attentive  Affect:  Appropriate  Cognitive:  Alert and Appropriate  Insight: Appropriate  Engagement in Group:  Engaged  Modes of Intervention:  Discussion  Pt. Was attentive and appropriate during today's group discussion. Pt. Was able to write down values. Pt was able to decide if the way you are living now in live with those values. Pt. Was able to break down values in groups values, what do i want to happen and wham am i going to do about it. Additional Comments:    April Manson 04/27/2013, 11:53 AM

## 2013-04-27 NOTE — Progress Notes (Signed)
D-pt attended group this am, but slept through most of group A-pt will be discharged tomorrow and will go straight to [facility] and remain on his meds R-pt given encouragement and support

## 2013-04-27 NOTE — Progress Notes (Signed)
Psychoeducational Group Note  Date:  04/27/2013 Time:  2000  Group Topic/Focus:  Wrap-Up Group:   The focus of this group is to help patients review their daily goal of treatment and discuss progress on daily workbooks.  Participation Level: Did Not Attend  Participation Quality:  Not Applicable  Affect:  Not Applicable  Cognitive:  Not Applicable  Insight:  Not Applicable  Engagement in Group: Not Applicable  Additional Comments:    Flonnie Hailstone 04/27/2013, 9:55 PM

## 2013-04-27 NOTE — Progress Notes (Signed)
[facility] MD Progress Note  04/27/2013 4:01 PM [name]  MRN:  [id number]  Subjective:  "I'm supposed to go to [facility] in the morning" Patient goes on to say that he is doing well and has no new complaints. He is compliant with his medication and reports no side effects. His mood is positive and he rates his depression at a 4-5/10 and his anxiety at a 2/10. He is motivated to continue his recovery.  Diagnosis:  AXIS I: Alcohol Abuse, Depressive Disorder NOS, Post Traumatic Stress Disorder and Cocaine dependence and alcohol abuse  AXIS II: Deferred  AXIS III:  Past Medical History   Diagnosis  Date   .  Anxiety    .  Bipolar 1 disorder    .  Asthma    .  Hypertension    .  Depression    .  PTSD (post-traumatic stress disorder)    .  Heart palpitations      with severe anxiety    AXIS IV: economic problems, educational problems, occupational problems, other psychosocial or environmental problems, problems related to social environment and problems with primary support group  AXIS V: 41-50 serious symptoms   ADL's:  Intact  Sleep: Good  Appetite:  Good  Suicidal Ideation:  denies Homicidal Ideation:  denies AEB (as evidenced by):  Psychiatric Specialty Exam: Review of Systems  Constitutional: Negative.  Negative for fever, chills, weight loss, malaise/fatigue and diaphoresis.  HENT: Negative for congestion and sore throat.   Eyes: Negative for blurred vision, double vision and photophobia.  Respiratory: Negative for cough, shortness of breath and wheezing.   Cardiovascular: Negative for chest pain, palpitations and PND.  Gastrointestinal: Negative for heartburn, nausea, vomiting, abdominal pain, diarrhea and constipation.  Musculoskeletal: Negative for myalgias, joint pain and falls.  Neurological: Negative for dizziness, tingling, tremors, sensory change, speech change, focal weakness, seizures, loss of consciousness, weakness and headaches.   Endo/Heme/Allergies: Negative for polydipsia. Does not bruise/bleed easily.  Psychiatric/Behavioral: Negative for depression, suicidal ideas, hallucinations, memory loss and substance abuse. The patient is not nervous/anxious and does not have insomnia.     Blood pressure 135/90, pulse 82, temperature 97.7 F (36.5 C), temperature source Oral, resp. rate 18, height 5\' 4"  (1.626 m), weight 117.935 kg (260 lb).Body mass index is 44.61 kg/(m^2).  General Appearance: Casual  Eye Contact::  Good  Speech:  Normal Rate  Volume:  Normal  Mood:  Anxious and Depressed  Affect:  Congruent  Thought Process:  Goal Directed  Orientation:  Full (Time, Place, and Person)  Thought Content:  WDL  Suicidal Thoughts:  No  Homicidal Thoughts:  No  Memory:  NA  Judgement:  Intact  Insight:  Lacking  Psychomotor Activity:  Normal  Concentration:  Fair  Recall:  Fair  Akathisia:  No  Handed:  Right  AIMS (if indicated):     Assets:  Communication Skills Desire for Improvement  Sleep:  Number of Hours: 5.5   Current Medications: Current Facility-Administered Medications  Medication Dose Route Frequency Provider Last Rate Last Dose  . acetaminophen (TYLENOL) tablet 650 mg  650 mg Oral Q6H PRN Kerry Hough, PA-C   650 mg at 04/27/13 4098  . albuterol (PROVENTIL HFA;VENTOLIN HFA) 108 (90 BASE) MCG/ACT inhaler 2 puff  2 puff Inhalation Q6H PRN Kerry Hough, PA-C      . alum & mag hydroxide-simeth (MAALOX/MYLANTA) 200-200-20 MG/5ML suspension 30 mL  30 mL Oral Q4H PRN Kerry Hough, PA-C      .  asenapine (SAPHRIS) sublingual tablet 5 mg  5 mg Sublingual BID Kerry Hough, PA-C   5 mg at 04/27/13 0751  . hydrOXYzine (ATARAX/VISTARIL) tablet 25 mg  25 mg Oral Q6H PRN Kerry Hough, PA-C   25 mg at 04/26/13 0827  . lisinopril (PRINIVIL,ZESTRIL) tablet 20 mg  20 mg Oral Daily Kerry Hough, PA-C   20 mg at 04/27/13 0751  . magnesium hydroxide (MILK OF MAGNESIA) suspension 30 mL  30 mL Oral Daily  PRN Kerry Hough, PA-C      . traZODone (DESYREL) tablet 100 mg  100 mg Oral QHS PRN Kerry Hough, PA-C   100 mg at 04/26/13 2230    Lab Results: No results found for this or any previous visit (from the past 48 hour(s)).  Physical Findings: AIMS: Facial and Oral Movements Muscles of Facial Expression: None, normal Lips and Perioral Area: None, normal Jaw: None, normal Tongue: None, normal,Extremity Movements Upper (arms, wrists, hands, fingers): None, normal Lower (legs, knees, ankles, toes): None, normal, Trunk Movements Neck, shoulders, hips: None, normal, Overall Severity Severity of abnormal movements (highest score from questions above): None, normal Incapacitation due to abnormal movements: None, normal, Dental Status Current problems with teeth and/or dentures?: No Does patient usually wear dentures?: No  CIWA:    COWS:     Treatment Plan Summary: Daily contact with patient to assess and evaluate symptoms and progress in treatment Medication management  Plan: 1. MD will plan to do SRA today for early AM discharge with his ACT team [facility] who will be picking him up at 7: OO AM to go to [facility] by 8AM. 2. Pharmacist is aware and samples are ordered. 3. Will follow current plan of care with no changes at this time.  Medical Decision Making Problem Points:  Established problem, stable/improving (1) and Review of psycho-social stressors (1) Data Points:  Review or order medicine tests (1)  I certify that inpatient services furnished can reasonably be expected to improve the patient's condition.   Rona Ravens. Mashburn RPAC 4:12 PM 04/27/2013  Reviewed the information documented and agree with the treatment plan.  Leoncio Hansen,JANARDHAHA R. 04/29/2013 11:14 AM

## 2013-04-27 NOTE — [facility] Suicide Risk Assessment (Signed)
[facility] INPATIENT:  Family/Significant Other Suicide Prevention Education  Suicide Prevention Education:  Patient Discharged to Other Healthcare Facility:  Suicide Prevention Education Not Provided: {PT. DISCHARGED TO OTHER HEALTHCARE FACILITY:SUICIDE PREVENTION EDUCATION NOT PROVIDED (CHL):  The patient is discharging to another healthcare facility for continuation of treatment.  The patient's medical information, including suicide ideations and risk factors, are a part of the medical information shared with the receiving healthcare facility.  Patient discharging to [facility].  Wynn Banker 04/27/2013, 10:58 AM

## 2013-04-27 NOTE — Tx Team (Signed)
Interdisciplinary Treatment Plan Update   Date Reviewed:  04/27/2013  Time Reviewed:  10:21 AM  Progress in Treatment:   Attending groups: Yes Participating in groups: Yes Taking medication as prescribed: Yes  Tolerating medication: Yes Family/Significant other contact made:No consent given for collateral contact. Patient understands diagnosis: Yes  Discussing patient identified problems/goals with staff: Yes Medical problems stabilized or resolved: Yes Denies suicidal/homicidal ideation: Yes Patient has not harmed self or others: Yes  For review of initial/current patient goals, please see plan of care.  Estimated Length of Stay:  1 day  Reasons for Continued Hospitalization:  Anxiety Depression Medication stabilization  New Problems/Goals identified:    Discharge Plan or Barriers:   Home with outpatient follow up [facility]  Additional Comments: N/A  Attendees:  Patient:  04/27/2013 10:21 AM   Signature: Mervyn Gay, MD 04/27/2013 10:21 AM  Signature:  Verne Spurr, PA 04/27/2013 10:21 AM  Signature: Harold Barban, RN 04/27/2013 10:21 AM  Signature: 04/27/2013 10:21 AM  Signature:   04/27/2013 10:21 AM  Signature:  Juline Patch, LCSW 04/27/2013 10:21 AM  Signature:  Reyes Ivan, LCSW 04/27/2013 10:21 AM  Signature:  Maseta Dorley,Care Coordinator 04/27/2013 10:21 AM  Signature: 04/27/2013 10:21 AM  Signature:    Signature:    Signature:      Scribe for Treatment Team:   Juline Patch,  04/27/2013 10:21 AM

## 2013-04-27 NOTE — Progress Notes (Signed)
Recreation Therapy Notes  Date: 08.27.2014 Time: 3:00pm Location: [address] Dayroom  Group Topic: Leisure Education  Goal Area(s) Addresses:  Patient will verbalize activity of interest by end of group session. Patient will verbalize the ability to use positive leisure/recreation as a coping mechanism.  Behavioral Response: Did not attend  Jearl Klinefelter, LRT/CTRS  Jearl Klinefelter 04/27/2013 4:31 PM

## 2013-04-27 NOTE — Progress Notes (Signed)
D.  Pt. Denies SI/HI and denies A/V hallucinations.  No concerns or issues voiced at present. A.  Pt. Encouraged to notify writer or staff of any concerns. R.  Pt. Remains safe.

## 2013-04-27 NOTE — Progress Notes (Signed)
Adult Psychoeducational Group Note  Date:  04/27/2013 Time:  11:08 AM  Group Topic/Focus:  Personal Choices and Values:   The focus of this group is to help patients assess and explore the importance of values in their lives, how their values affect their decisions, how they express their values and what opposes their expression.  Participation Level:  Active  Participation Quality:  Appropriate  Affect:  Flat  Cognitive:  Appropriate  Insight: Appropriate  Engagement in Group:  Engaged  Modes of Intervention:  Discussion  Additional Comments:  Today's group was a therapeutic activity. Pt attended group and was very engaged.    [place] Shi K 04/27/2013, 11:08 AM

## 2013-04-27 NOTE — [facility] Group Notes (Signed)
[facility] LCSW Aftercare Discharge Planning Group Note   04/27/2013 9:47 AM    Participation Quality:  Appropraite  Mood/Affect:  Appropriate  Depression Rating:  6  Anxiety Rating:  6  Thoughts of Suicide:  No  Will you contract for safety?   NA  Current AVH:  No  Plan for Discharge/Comments:  Patient attending discharge planning group and actively participated in group.  Patient reports doing a little better today.  He was informed [facility] will accept him for an assessment on Thursday, April 28, 2013.  CSW provided all participants with daily workbook and information on services offered by [facility].   Transportation Means: Patient has transportation.   Supports:  Patient has a support system.   Caulin Begley, Joesph July
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
## 2013-04-27 NOTE — [facility] Group Notes (Signed)
[facility] LCSW Group Therapy  04/27/2013  1:15 PM   Type of Therapy:  Group Therapy  Participation Level:  Active  Participation Quality:  Appropriate and Attentive  Affect:  Appropriate, Flat and Depressed  Cognitive:  Alert and Appropriate  Insight:  Developing/Improving and Engaged  Engagement in Therapy:  Developing/Improving and Engaged  Modes of Intervention:  Clarification, Confrontation, Discussion, Education, Exploration, Limit-setting, Orientation, Problem-solving, Rapport Building, Dance movement psychotherapist, Socialization and Support  Summary of Progress/Problems: The topic for group today was emotional regulation.  This group focused on both positive and negative emotion identification and allowed group members to process ways to identify feelings, regulate negative emotions, and find healthy ways to manage internal/external emotions. Group members were asked to reflect on a time when their reaction to an emotion led to a negative outcome and explored how alternative responses using emotion regulation would have benefited them. Group members were also asked to discuss a time when emotion regulation was utilized when a negative emotion was experienced.  Pt states that he is just taking it one day at a time and is listening to group discussion but doesn't feel open to sharing at this time.  Pt was observed to actively listen to group discussion.    [name], [place] 04/27/2013 2:53 PM

## 2013-04-28 NOTE — Progress Notes (Signed)
Patient ID: [name], male   DOB: [birth year], 42 y.o.   MRN: [id number] Pt discharged to family member. Patient will be going to [facility] today. He denies SI/HI/AVH. Patient education completed by Jacques Navy, RN and patient verbalized understanding. His belongings were returned. Patient was given a meal at discharge.

## 2013-04-28 NOTE — Progress Notes (Signed)
[facility] Adult Case Management Discharge Plan :  Will you be returning to the same living situation after discharge: No.  Patient discharged to residential treatment at [facility]. At discharge, do you have transportation home?:Yes,  Patient assisted with a bus pass Do you have the ability to pay for your medications:No.  Patient will be assisted with indigent medications.   Release of information consent forms completed and in the chart;  Patient's signature needed at discharge.  Patient to Follow up at: Follow-up Information   Follow up On 04/28/2013. (Thursday, April 28, 2013 at 8 AM)    Contact information:   [address] [place], [place]        Patient denies SI/HI:   Patient no longer endorsing SI/HI or other thoughts of self harm.     Safety Planning and Suicide Prevention discussed:  .Reviewed with all patients during discharge planning group  Saraphina Lauderbaugh, Joesph July 04/28/2013, 10:06 AM

## 2013-05-02 NOTE — Discharge Summary (Signed)
Physician Discharge Summary Note  Patient:  [name] is an 42 y.o., male MRN:  [id number] DOB:  October 04, 1970 Patient phone:  [phone] (home)  Patient address:   [address] [place] [address],   Date of Admission:  04/26/2013 Date of Discharge: 04/28/2013  Reason for Admission:  Relapse on Alcohol  Discharge Diagnoses: Active Problems:   Polysubstance dependence cocaine & alcohol   Post traumatic stress disorder (PTSD)   Alcohol abuse   Cocaine dependence   Depressive disorder, not elsewhere classified  ROS  DSM5: AXIS I: Alcohol Abuse, Depressive Disorder NOS, Post Traumatic Stress Disorder and Cocaine dependence and alcohol abuse  AXIS II: Deferred  AXIS III:  Past Medical History   Diagnosis  Date   .  Anxiety    .  Bipolar 1 disorder    .  Asthma    .  Hypertension    .  Depression    .  PTSD (post-traumatic stress disorder)    .  Heart palpitations      with severe anxiety    AXIS IV: economic problems, educational problems, occupational problems, other psychosocial or environmental problems, problems related to social environment and problems with primary support group  AXIS V: 41-50 serious symptoms   Level of Care:  RTC  Hospital Course:  Gibson was admitted for relapsing on alcohol. He was treated with the standard [facility] detox protocol and oriented to the unit. He requested to have residential treatment upon completion of his detox. He was active in unit programming and motivated to pursue his sobriety.       [name] was discharged shortly after 2 days admission to [facility]. His bed had been held and he was motivated to attend that program.        He denied SI/HI or AVH, and reported no W/D symptoms. He was discharged early on this morning to meet the bus to keep his appointment at [facility].  Consults:  None  Significant Diagnostic Studies:  None  Discharge Vitals:   Blood pressure 135/90, pulse 82, temperature 97.7 F (36.5 C),  temperature source Oral, resp. rate 18, height 5\' 4"  (1.626 m), weight 117.935 kg (260 lb). Body mass index is 44.61 kg/(m^2). Lab Results:   No results found for this or any previous visit (from the past 72 hour(s)).  Physical Findings: AIMS: Facial and Oral Movements Muscles of Facial Expression: None, normal Lips and Perioral Area: None, normal Jaw: None, normal Tongue: None, normal,Extremity Movements Upper (arms, wrists, hands, fingers): None, normal Lower (legs, knees, ankles, toes): None, normal, Trunk Movements Neck, shoulders, hips: None, normal, Overall Severity Severity of abnormal movements (highest score from questions above): None, normal Incapacitation due to abnormal movements: None, normal, Dental Status Current problems with teeth and/or dentures?: No Does patient usually wear dentures?: No  CIWA:    COWS:     Psychiatric Specialty Exam: See Psychiatric Specialty Exam and Suicide Risk Assessment completed by Attending Physician prior to discharge.  Discharge destination:  [facility]  Is patient on multiple antipsychotic therapies at discharge:  No   Has Patient had three or more failed trials of antipsychotic monotherapy by history:  No  Recommended Plan for Multiple Antipsychotic Therapies: NA  Discharge Orders   Future Orders Complete By Expires   Diet - low sodium heart healthy  As directed    Discharge instructions  As directed    Comments:     Take all of your medications as directed. Be sure to  keep all of your follow up appointments.  If you are unable to keep your follow up appointment, call your Doctor's office to let them know, and reschedule.  Make sure that you have enough medication to last until your appointment. Be sure to get plenty of rest. Going to bed at the same time each night will help. Try to avoid sleeping during the day.  Increase your activity as tolerated. Regular exercise will help you to sleep better and improve your mental  health. Eating a heart healthy diet is recommended. Try to avoid salty or fried foods. Be sure to avoid all alcohol and illegal drugs.   Increase activity slowly  As directed        Medication List    STOP taking these medications       acetaminophen 500 MG tablet  Commonly known as:  TYLENOL     aspirin 325 MG tablet     diazepam 2 MG tablet  Commonly known as:  VALIUM      TAKE these medications     Indication   albuterol 108 (90 BASE) MCG/ACT inhaler  Commonly known as:  PROVENTIL HFA;VENTOLIN HFA  Inhale 2 puffs into the lungs every 6 (six) hours as needed for wheezing or shortness of breath.   Indication:  Asthma     asenapine 5 MG Subl 24 hr tablet  Commonly known as:  SAPHRIS  Place 1 tablet (5 mg total) under the tongue 2 (two) times daily. For mood control   Indication:  Mood control     hydrOXYzine 25 MG tablet  Commonly known as:  ATARAX/VISTARIL  Take 1 tablet (25 mg total) by mouth every 6 (six) hours as needed for anxiety.   Indication:  Anxiety associated with Organic Disease     lisinopril 20 MG tablet  Commonly known as:  PRINIVIL,ZESTRIL  Take 1 tablet (20 mg total) by mouth daily. For hypertension control   Indication:  High Blood Pressure     traZODone 100 MG tablet  Commonly known as:  DESYREL  Take 1 tablet (100 mg total) by mouth at bedtime as needed for sleep.   Indication:  Trouble Sleeping           Follow-up Information   Follow up On 04/28/2013. (Thursday, April 28, 2013 at 8 AM)    Contact information:   [address] [place], [place]        Follow-up recommendations:   Activities: Resume activity as tolerated. Diet: Heart healthy low sodium diet Tests: Follow up testing will be determined by your out patient provider.  Comments:    Total Discharge Time:  Less than 30 minutes.  Signed: MASHBURN,NEIL 05/02/2013, 3:47 PM  Patient is seen personally face to face, evaluated for suicidal risk assessment, discharge  plan developed and case discussed with treatment team. Reviewed the information documented and agree with the treatment plan.  Dal Blew,JANARDHAHA R. 05/03/2013 2:50 PM

## 2013-05-03 NOTE — Progress Notes (Signed)
Patient Discharge Instructions:  After Visit Summary (AVS):   Faxed to:  05/03/13 Discharge Summary Note:   Faxed to:  05/03/13 Psychiatric Admission Assessment Note:   Faxed to:  05/03/13 Suicide Risk Assessment - Discharge Assessment:   Faxed to:  05/03/13 Faxed/Sent to the Next Level Care provider:  05/03/13 Faxed to [facility] @ [phone]  Jerelene Redden, 05/03/2013, 4:25 PM

## 2013-05-12 NOTE — ED Provider Notes (Signed)
SEe my original note  [name]. Oletta Lamas, MD 05/12/13 2217

## 2013-08-04 ENCOUNTER — Encounter (HOSPITAL_COMMUNITY): Payer: Self-pay | Admitting: Emergency Medicine

## 2013-08-04 ENCOUNTER — Emergency Department (HOSPITAL_COMMUNITY): Payer: Medicare Other

## 2013-08-04 ENCOUNTER — Emergency Department (HOSPITAL_COMMUNITY)
Admission: EM | Admit: 2013-08-04 | Discharge: 2013-08-04 | Disposition: A | Discharge status: Home / Self Care | Payer: Medicare Other | Attending: Emergency Medicine | Admitting: Emergency Medicine

## 2013-08-04 DIAGNOSIS — I493 Ventricular premature depolarization: Secondary | ICD-10-CM

## 2013-08-04 DIAGNOSIS — R0789 Other chest pain: Secondary | ICD-10-CM | POA: Insufficient documentation

## 2013-08-04 DIAGNOSIS — I10 Essential (primary) hypertension: Secondary | ICD-10-CM | POA: Insufficient documentation

## 2013-08-04 DIAGNOSIS — Z79899 Other long term (current) drug therapy: Secondary | ICD-10-CM | POA: Insufficient documentation

## 2013-08-04 DIAGNOSIS — J45909 Unspecified asthma, uncomplicated: Secondary | ICD-10-CM | POA: Insufficient documentation

## 2013-08-04 DIAGNOSIS — F411 Generalized anxiety disorder: Secondary | ICD-10-CM | POA: Insufficient documentation

## 2013-08-04 DIAGNOSIS — I4949 Other premature depolarization: Secondary | ICD-10-CM | POA: Insufficient documentation

## 2013-08-04 DIAGNOSIS — F419 Anxiety disorder, unspecified: Secondary | ICD-10-CM

## 2013-08-04 LAB — CBC
MCH: 27.8 pg (ref 26.0–34.0)
MCHC: 34.2 g/dL (ref 30.0–36.0)
MCV: 81.1 fL (ref 78.0–100.0)
Platelets: 295 10*3/uL (ref 150–400)
RBC: 5.08 MIL/uL (ref 4.22–5.81)
RDW: 13.3 % (ref 11.5–15.5)

## 2013-08-04 LAB — COMPREHENSIVE METABOLIC PANEL
ALT: 11 U/L (ref 0–53)
AST: 17 U/L (ref 0–37)
Albumin: 4.1 g/dL (ref 3.5–5.2)
CO2: 28 mEq/L (ref 19–32)
Calcium: 9.1 mg/dL (ref 8.4–10.5)
Creatinine, Ser: 0.82 mg/dL (ref 0.50–1.35)
GFR calc non Af Amer: >90 mL/min (ref >90)
Sodium: 137 mEq/L (ref 135–145)
Total Protein: 7.9 g/dL (ref 6.0–8.3)

## 2013-08-04 LAB — POCT I-STAT TROPONIN I

## 2013-08-04 MED ORDER — LORAZEPAM 1 MG PO TABS
1.0000 mg | ORAL_TABLET | Freq: Once | ORAL | Status: AC
  Administered 2013-08-04: 1 mg via ORAL
  Filled 2013-08-04: qty 1

## 2013-08-04 NOTE — ED Provider Notes (Signed)
CSN: [id number]     Arrival date & time 08/04/13  1404 History   None    Chief Complaint  Patient presents with  . Palpitations   (Consider location/radiation/quality/duration/timing/severity/associated sxs/prior Treatment) HPI [name] is a 42 y.o. y/o male w/ PMHx of Depression, Anxiety, PTSD, Bipolar Disorder, Asthma, and HTN, presents to the ED w/ complaints of recent palpitations. The patient claims to have significantly worsened anxiety and panic attacks recently w/ associated palpitations. The patient claims he feels his heart beating very fast and can't seem to slow it down. He only takes Paxil for his depression and anxiety but does not take any other medications for this. The patient otherwise denies any associated dizziness, lightheadedness, SOB, chest pain, diaphoresis, nausea or vomiting.   Past Medical History  Diagnosis Date  . Anxiety   . Bipolar 1 disorder   . Asthma   . Hypertension   . Depression   . PTSD (post-traumatic stress disorder)   . Heart palpitations     with severe anxiety   Past Surgical History  Procedure Laterality Date  . No past surgeries     No family history on file. History  Substance Use Topics  . Smoking status: Never Smoker   . Smokeless tobacco: Never Used  . Alcohol Use: 3.6 oz/week    1 Glasses of wine, 5 Cans of beer, 0 Shots of liquor, 0 Drinks containing 0.5 oz of alcohol per week     Comment: binger on etoh and cocaine    Review of Systems General: Denies fever, chills, diaphoresis, appetite change and fatigue.  Respiratory: Positive for mild chest tightness associated w/ panic attacks. Denies SOB, DOE, cough, and wheezing.   Cardiovascular: Positive for palpitations. Denies chest pain and leg swelling.  Gastrointestinal: Denies nausea, vomiting, abdominal pain, diarrhea, constipation, blood in stool and abdominal distention.  Genitourinary: Denies dysuria, urgency, frequency, hematuria, flank pain and difficulty  urinating.  Endocrine: Denies hot or cold intolerance, sweats, polyuria, polydipsia. Musculoskeletal: Denies myalgias, back pain, joint swelling, arthralgias and gait problem.  Skin: Denies pallor, rash and wounds.  Neurological: Denies dizziness, seizures, syncope, weakness, lightheadedness, numbness and headaches.  Psychiatric/Behavioral: Positive for recent anxiety and sleep disturbances. Denies mood changes, confusion.  Allergies  Review of patient's allergies indicates no known allergies.  Home Medications   Current Outpatient Rx  Name  Route  Sig  Dispense  Refill  . albuterol (PROVENTIL HFA;VENTOLIN HFA) 108 (90 BASE) MCG/ACT inhaler   Inhalation   Inhale 2 puffs into the lungs every 6 (six) hours as needed for wheezing or shortness of breath.   1 Inhaler   0   . lisinopril (PRINIVIL,ZESTRIL) 20 MG tablet   Oral   Take 1 tablet (20 mg total) by mouth daily. For hypertension control   30 tablet   0    Physical Exam Filed Vitals:   08/04/13 1409 08/04/13 1719 08/04/13 1720  BP: 153/110 133/93   Pulse: 84  80  Temp: 98.2 F (36.8 C)    TempSrc: Oral    Resp: 20  13  SpO2: 97%  97%  General: Vital signs reviewed.  Patient is a well-developed and well-nourished, in no acute distress and cooperative with exam. Alert and oriented x3.  Head: Normocephalic and atraumatic. Old scar on R. Forehead. Eyes: PERRL, EOMI, conjunctivae normal, No scleral icterus.  Neck: Supple, trachea midline, normal ROM, No JVD, masses, thyromegaly, or carotid bruit present.  Cardiovascular: RRR, S1 normal, S2 normal, no murmurs,  gallops, or rubs. Pulmonary/Chest: Normal respiratory effort, CTAB, no wheezes, rales, or rhonchi. Abdominal: Soft, non-tender, non-distended, bowel sounds are normal, no masses, organomegaly, or guarding present.  Musculoskeletal: No joint deformities, erythema, or stiffness, ROM full and no nontender. Extremities: No swelling or edema,  pulses symmetric and intact  bilaterally. No cyanosis or clubbing. Numerous cutting scars on arms bilaterally. No new lesions. Neurological: A&O x3, Strength is normal and symmetric bilaterally, cranial nerve II-XII are grossly intact, no focal motor deficit, sensory intact to light touch bilaterally.  Skin: Warm, dry and intact. No rashes or erythema. Psychiatric: Normal mood, but w/ flat affect. Speech and behavior is normal. Cognition and memory are normal. No suicidal or homicidal ideations.  ED Course  Procedures (including critical care time) Labs Review Labs Reviewed  CBC  COMPREHENSIVE METABOLIC PANEL  POCT I-STAT TROPONIN I   Imaging Review Dg Chest 2 View  08/04/2013   CLINICAL DATA:  Palpitations  EXAM: CHEST  2 VIEW  COMPARISON:  03/27/2013  FINDINGS: The heart size and mediastinal contours are within normal limits. Both lungs are clear. The visualized skeletal structures are unremarkable.  IMPRESSION: No active cardiopulmonary disease.   Electronically Signed   By: Marlan Palau M.D.   On: 08/04/2013 14:59    EKG Interpretation   None       MDM   [name] is a 42 y.o. y/o male w/ PMHx of Depression, Anxiety, PTSD, Bipolar Disorder, Asthma, and HTN, presents to the ED w/ complaints of recent palpitations associated w/ anxiety. Patient denies any other associated issues at this time. ONly takes Paxil for his anxiety and depression, does not seem to be effective at this time. -EKG shows NSR @ 84 bpm, unchanged from previous EKG. Also infrequent PVC's.  -poc troponin -ve -CXR shows no active cardiopulmonary disease. -CBC, CMP w/ no abnormalities. -Give 1 mg Ativan for anxiety  Patient stable for discharge. Discussed decreasing caffeine intake. Instructed patient to follow up w/ Behavioral Health and PCP.  Courtney Paris, MD 08/04/13 [phone]

## 2013-08-04 NOTE — ED Notes (Signed)
Pt with hx of palpitations to ED c/o palpitations today.  C/o nagging cough x 2 months as well.

## 2013-08-04 NOTE — ED Provider Notes (Signed)
I saw and evaluated the patient, reviewed the resident's note and I agree with the findings and plan.  EKG Interpretation    Date/Time:  Thursday August 04 2013 14:12:14 EST Ventricular Rate:  84 PR Interval:  180 QRS Duration: 92 QT Interval:  378 QTC Calculation: 446 R Axis:   -8 Text Interpretation:  Normal sinus rhythm with sinus arrhythmia Septal infarct , age undetermined Abnormal ECG compared to prior,  no PVCs on this EKG Confirmed by [facility]  MD, TREY ([id number]) on 08/04/2013 7:39:05 PM            42 year old male with history of anxiety presenting with palpitations.  No syncope, chest pain, shortness of breath. Well-appearing, slightly anxious, heart sounds normal, regular rate and rhythm with occasional extrasystole, lungs clear to auscultation bilaterally. Palpitations correlate with PVCs seen on monitor. Plan outpatient stress management and PCP followup.  Clinical Impression: 1. Anxiety   2. PVC's (premature ventricular contractions)       Candyce Churn, MD 08/05/13 [phone]

## 2013-08-05 NOTE — ED Provider Notes (Signed)
I saw and evaluated the patient, reviewed the resident's note and I agree with the findings and plan.  EKG Interpretation    Date/Time:  Thursday August 04 2013 14:12:14 EST Ventricular Rate:  84 PR Interval:  180 QRS Duration: 92 QT Interval:  378 QTC Calculation: 446 R Axis:   -8 Text Interpretation:  Normal sinus rhythm with sinus arrhythmia Septal infarct , age undetermined Abnormal ECG compared to prior,  no PVCs on this EKG Confirmed by [facility]  MD, TREY ([id number]) on 08/04/2013 7:39:05 PM              Candyce Churn, MD 08/05/13 [phone]

## 2013-12-16 ENCOUNTER — Emergency Department (HOSPITAL_COMMUNITY)
Admission: EM | Admit: 2013-12-16 | Discharge: 2013-12-16 | Disposition: A | Discharge status: Home / Self Care | Payer: Medicare Other | Attending: Emergency Medicine | Admitting: Emergency Medicine

## 2013-12-16 ENCOUNTER — Encounter (HOSPITAL_COMMUNITY): Payer: Self-pay | Admitting: Emergency Medicine

## 2013-12-16 DIAGNOSIS — I4949 Other premature depolarization: Secondary | ICD-10-CM | POA: Insufficient documentation

## 2013-12-16 DIAGNOSIS — R002 Palpitations: Secondary | ICD-10-CM

## 2013-12-16 DIAGNOSIS — F411 Generalized anxiety disorder: Secondary | ICD-10-CM | POA: Insufficient documentation

## 2013-12-16 DIAGNOSIS — Z79899 Other long term (current) drug therapy: Secondary | ICD-10-CM | POA: Insufficient documentation

## 2013-12-16 DIAGNOSIS — I493 Ventricular premature depolarization: Secondary | ICD-10-CM

## 2013-12-16 DIAGNOSIS — I10 Essential (primary) hypertension: Secondary | ICD-10-CM | POA: Insufficient documentation

## 2013-12-16 DIAGNOSIS — J45901 Unspecified asthma with (acute) exacerbation: Secondary | ICD-10-CM | POA: Insufficient documentation

## 2013-12-16 LAB — CBC
HEMATOCRIT: 43.6 % (ref 39.0–52.0)
HEMOGLOBIN: 14.5 g/dL (ref 13.0–17.0)
MCH: 27.7 pg (ref 26.0–34.0)
MCHC: 33.3 g/dL (ref 30.0–36.0)
MCV: 83.2 fL (ref 78.0–100.0)
Platelets: 250 10*3/uL (ref 150–400)
RBC: 5.24 MIL/uL (ref 4.22–5.81)
RDW: 13.9 % (ref 11.5–15.5)
WBC: 5.6 10*3/uL (ref 4.0–10.5)

## 2013-12-16 LAB — BASIC METABOLIC PANEL
BUN: 8 mg/dL (ref 6–23)
CHLORIDE: 102 meq/L (ref 96–112)
CO2: 21 meq/L (ref 19–32)
Calcium: 8.7 mg/dL (ref 8.4–10.5)
Creatinine, Ser: 0.75 mg/dL (ref 0.50–1.35)
GFR calc Af Amer: >90 mL/min (ref >90)
GFR calc non Af Amer: >90 mL/min (ref >90)
Glucose, Bld: 99 mg/dL (ref 70–99)
Potassium: 3.5 mEq/L — ABNORMAL LOW (ref 3.7–5.3)
Sodium: 139 mEq/L (ref 137–147)

## 2013-12-16 LAB — I-STAT TROPONIN, ED: Troponin i, poc: 0 ng/mL (ref 0.00–0.08)

## 2013-12-16 NOTE — ED Notes (Signed)
Per [facility], pt from home for palpitations, hx of afib. States he does not take anything for his Afib. States he was watching TV and the palpitations just came on. Denies any N/V, but does report some SOB.

## 2013-12-16 NOTE — ED Provider Notes (Signed)
CSN: [id number]     Arrival date & time 12/16/13  1007 History   First MD Initiated Contact with Patient 12/16/13 1013     Chief Complaint  Patient presents with  . Shortness of Breath     (Consider location/radiation/quality/duration/timing/severity/associated sxs/prior Treatment) HPI Pt is a 43yo male with hx of anxiety, depression, bipolar 1 disorder, PTSD, asthma, HTN, and heart palpitations with severe anxiety presenting to ED via EMS from home for palpitations. Pt reports hx of afib however states he has never been evaluated by a cardiologist and is not on any daily medications. Pt states he has not seen a PCP in over 8 months. Today, symptoms started around 9AM whil pt was watching TV.  Pt stated he started to feel very anxious. States heart was skipping beats and occasionally felt like it was race. States palpitations have improved since being in ED. Reports occasional small palpitation. Pt did have mild SOB associated with palpitations PTA. Denies chest pain, n/v, or diaphoresis.  Denies hx of PE or CAD.  Past Medical History  Diagnosis Date  . Anxiety   . Bipolar 1 disorder   . Asthma   . Hypertension   . Depression   . PTSD (post-traumatic stress disorder)   . Heart palpitations     with severe anxiety   Past Surgical History  Procedure Laterality Date  . No past surgeries     No family history on file. History  Substance Use Topics  . Smoking status: Never Smoker   . Smokeless tobacco: Never Used  . Alcohol Use: 3.6 oz/week    1 Glasses of wine, 5 Cans of beer, 0 Shots of liquor, 0 Drinks containing 0.5 oz of alcohol per week     Comment: binger on etoh and cocaine    Review of Systems  Constitutional: Negative for fever, chills, diaphoresis and fatigue.  Respiratory: Positive for shortness of breath.   Cardiovascular: Positive for palpitations. Negative for chest pain and leg swelling.  Gastrointestinal: Negative for nausea and vomiting.   Psychiatric/Behavioral: Negative for suicidal ideas, sleep disturbance, self-injury and agitation. The patient is nervous/anxious.   All other systems reviewed and are negative.     Allergies  Review of patient's allergies indicates no known allergies.  Home Medications   Prior to Admission medications   Medication Sig Start Date End Date Taking? Authorizing Provider  albuterol (PROVENTIL HFA;VENTOLIN HFA) 108 (90 BASE) MCG/ACT inhaler Inhale 2 puffs into the lungs every 6 (six) hours as needed for wheezing or shortness of breath. 04/27/13   Verne SpurrNeil Mashburn, PA-C  lisinopril (PRINIVIL,ZESTRIL) 20 MG tablet Take 1 tablet (20 mg total) by mouth daily. For hypertension control 04/27/13   Verne SpurrNeil Mashburn, PA-C   BP 143/85  Pulse 93  Temp(Src) 98.2 F (36.8 C) (Oral)  Resp 18  Ht 5\' 6"  (1.676 m)  Wt 270 lb (122.471 kg)  BMI 43.60 kg/m2  SpO2 97% Physical Exam  Nursing note and vitals reviewed. Constitutional: He is oriented to person, place, and time. He appears well-developed and well-nourished.  Pt lying in darkened exam room, eyes closed but pt awake and oriented.  HENT:  Head: Normocephalic and atraumatic.  Eyes: Conjunctivae are normal. No scleral icterus.  Neck: Normal range of motion.  Cardiovascular: Normal rate and normal heart sounds.  A regularly irregular rhythm present.  Pulmonary/Chest: Effort normal and breath sounds normal. No respiratory distress. He has no wheezes. He has no rales. He exhibits no tenderness.  No respiratory distress, able  to speak in full sentences w/o difficulty. Lungs: CTAB  Abdominal: Soft. Bowel sounds are normal. He exhibits no distension and no mass. There is no tenderness. There is no rebound and no guarding.  Musculoskeletal: Normal range of motion.  Neurological: He is alert and oriented to person, place, and time.  Skin: Skin is warm and dry.  Psychiatric: He has a normal mood and affect. His speech is normal.    ED Course  Procedures  (including critical care time) Labs Review Labs Reviewed  BASIC METABOLIC PANEL - Abnormal; Notable for the following:    Potassium 3.5 (*)    All other components within normal limits  CBC  I-STAT TROPOININ, ED    Imaging Review No results found.   EKG Interpretation   Date/Time:  Friday December 16 2013 10:17:19 EDT Ventricular Rate:  91 PR Interval:  189 QRS Duration: 101 QT Interval:  362 QTC Calculation: 445 R Axis:   -27 Text Interpretation:  Sinus rhythm Multiple ventricular premature  complexes Consider left atrial enlargement Borderline left axis deviation  Low voltage, precordial leads Anteroseptal infarct, old Since last tracing  Premature ventricular complexes are new Confirmed by Effie ShyWENTZ  MD, ELLIOTT  ([id number]) on 12/16/2013 10:30:22 AM      MDM   Final diagnoses:  Palpitations  PVC's (premature ventricular contractions)    Pt is a 43yo male with reported hx of afib and PTSD c/o palpitations.  Upon arrival to ED, symptoms have improved. Pt reports mild palpitations. Denies chest pain or SOB.  Low concern for ACS or PE.  Not concerned for pneumonia.  Vitals: WNL.    CBC, BMP, troponin: negative EKG-sinus rhythm with multiple PVCs, new since last tracing.    Discussed pt with Dr. Effie ShyWentz.  Will discharge pt home and have him f/u as outpatient with cardiology and [facility]. Return precautions provided. Pt verbalized understanding and agreement with tx plan.     Junius Finnerrin O'Malley, PA-C 12/16/13 1555

## 2013-12-16 NOTE — Discharge Instructions (Signed)
Be sure to follow up with a primary care provider as well as cardiologist, contact information above.  Return to ER if symptoms worsen or new symptoms arise, including chest pain, trouble breathing, passing out, or other concerning symptoms develop.  Cardiac Arrhythmia Your heart is a muscle that works to pump blood through your body by regular contractions. The beating of your heart is controlled by a system of special pacemaker cells. These cells control the electrical activity of the heart. When the system controlling this regular beating is disturbed, a heart rhythm abnormality (arrhythmia) results. WHEN YOUR HEART SKIPS A BEAT One of the most common and least serious heart arrhythmias is called an ectopic or premature atrial heartbeat (PAC). This may be noticed as a small change in your regular pulse. A PAC originates from the top part (atrium) of the heart. Within the right atrium, the SA node is the area that normally controls the regularity of the heart. PACs occur in heart tissue outside of the SA node region. You may feel this as a skipped beat or heart flutter, especially if several occur in succession or occur frequently.  Another arrhythmia is ventricular premature complex (VCP or PVC). These extra beats start out in the bottom, more muscular chambers of the heart. In most cases a PVC is harmless. If there are underlying causes that are making the heart irritable such as an overactive thyroid or a prior heart attack PVCs may be of more concern. In a few cases, medications to control the heart rhythm may be prescribed. Things to try at home:  Cut down or avoid alcohol, tobacco and caffeine.  Get enough sleep.  Reduce stress.  Exercise more. WHEN THE HEART BEATS TOO FAST Atrial tachycardia is a fast heart rate, which starts out in the atrium. It may last from minutes to much longer. Your heart may beat 140 to 240 times per minute instead of the normal 60 to 100.  Symptoms include a  worried feeling (anxiety) and a sense that your heart is beating fast and hard.  You may be able to stop the fast rate by holding your breath or bearing down as if you were going to have a bowel movement.  This type of fast rate is usually not dangerous. Atrial fibrillation and atrial flutter are other fast rhythms that start in the atria. Both conditions keep the atria from filling with enough blood so the heart does not work well.  Symptoms include feeling light-headed or faint.  These fast rates may be the result of heart damage or disease. Too much thyroid hormone may play a role.  There may be no clear cause or it may be from heart disease or damage.  Medication or a special electrical treatment (cardioversion) may be needed to get the heart beating normally. Ventricular tachycardia is a fast heart rate that starts in the lower muscular chambers (ventricles) This is a serious disorder that requires treatment as soon as possible. You need someone else to get and use a small defibrillator.  Symptoms include collapse, chest pain, or being short of breath.  Treatment may include medication, procedures to improve blood flow to the heart, or an implantable cardiac defibrillator (ICD). DIAGNOSIS   A cardiogram (EKG or ECG) will be done to see the arrhythmia, as well as lab tests to check the underlying cause.  If the extra beats or fast rate come and go, you may wear a Holter monitor that records your heart rate for a longer period  of time. SEEK MEDICAL CARE IF:  You have irregular or fast heartbeats (palpitations).  You experience skipped beats.  You develop lightheadedness.  You have chest discomfort.  You have shortness of breath.  You have more frequent episodes, if you are already being treated. SEEK IMMEDIATE MEDICAL CARE IF:   You have severe chest pain, especially if the pain is crushing or pressure-like and spreads to the arms, back, neck, or jaw, or if you have  sweating, feeling sick to your stomach (nausea), or shortness of breath. THIS IS AN EMERGENCY. Do not wait to see if the pain will go away. Get medical help at once. Call 911 or 0 (operator). DO NOT drive yourself to the hospital.  You feel dizzy or faint.  You have episodes of previously documented atrial tachycardia that do not resolve with the techniques your caregiver has taught you.  Irregular or rapid heartbeats begin to occur more often than in the past, especially if they are associated with more pronounced symptoms or of longer duration. Document Released: 08/18/2005 Document Revised: 11/10/2011 Document Reviewed: 04/05/2008 [facility] Patient Information 2014 [place], [place].  Cardiac Event Monitoring A cardiac event monitor is a small recording device used to help detect abnormal heart rhythms (arrhythmias). The monitor is used to record heart rhythm when noticeable symptoms such as the following occur:  Fast heart beats (palpitations), such as heart racing or fluttering.  Dizziness.  Fainting or lightheadedness.  Unexplained weakness. The monitor is wired to two electrodes placed on your chest. Electrodes are flat, sticky disks that attach to your skin. The monitor can be worn for up to 30 days. You will wear the monitor at all times, except when bathing.  HOW TO USE YOUR CARDIAC EVENT MONITOR A technician will prepare your chest for the electrode placement. The technician will show you how to place the electrodes, how to work the monitor, and how to replace the batteries. Take time to practice using the monitor before you leave the office. Make sure you understand how to send the information from the monitor to your health care provider. This requires a telephone with a landline, not a cellphone. You need to:  Wear your monitor at all times, except when you are in water:  Do not get the monitor wet.  Take the monitor off when bathing. Do not swim or use a hot tub with it  on.  Keep your skin clean. Do not put body lotion or moisturizer on your chest.  Change the electrodes daily or any time they stop sticking to your skin. You might need to use tape to keep them on.  It is possible that your skin under the electrodes could become irritated. To keep this from happening, try to put the electrodes in slightly different places on your chest. However, they must remain in the area under your left breast and in the upper right section of your chest.  Make sure the monitor is safely clipped to your clothing or in a location close to your body that your health care provider recommends.  Press the button to record when you feel symptoms of heart trouble, such as dizziness, weakness, lightheadedness, palpitations, thumping, shortness of breath, unexplained weakness, or a fluttering or racing heart. The monitor is always on and records what happened slightly before you pressed the button, so do not worry about being too late to get good information.  Keep a diary of your activities, such as walking, doing chores, and taking medicine. It is  especially important to note what you were doing when you pushed the button to record your symptoms. This will help your health care provider determine what might be contributing to your symptoms. The information stored in your monitor will be reviewed by your health care provider alongside your diary entries.  Send the recorded information as recommended by your health care provider. It is important to understand that it will take some time for your health care provider to process the results.  Change the batteries as recommended by your health care provider. SEEK IMMEDIATE MEDICAL CARE IF:   You have chest pain.  You have extreme difficulty breathing or shortness of breath.  You develop a very fast heartbeat that persists.  You develop dizziness that does not go away .  You faint or constantly feel you are about to faint. Document  Released: 05/27/2008 Document Revised: 04/20/2013 Document Reviewed: 02/14/2013 [facility] Patient Information 2014 [place]

## 2013-12-16 NOTE — ED Provider Notes (Signed)
Medical screening examination/treatment/procedure(s) were performed by non-physician practitioner and as supervising physician I was immediately available for consultation/collaboration.  Flint MelterElliott L Xaviar Lunn, MD 12/16/13 [phone]

## 2013-12-19 ENCOUNTER — Encounter: Payer: Self-pay | Admitting: Cardiovascular Disease

## 2013-12-19 NOTE — Progress Notes (Signed)
No show

## 2014-01-13 ENCOUNTER — Encounter: Payer: Self-pay | Admitting: Cardiovascular Disease

## 2014-01-13 NOTE — Progress Notes (Signed)
No show

## 2014-07-21 ENCOUNTER — Ambulatory Visit: Payer: Self-pay | Admitting: Family Medicine

## 2015-04-09 ENCOUNTER — Encounter (HOSPITAL_COMMUNITY): Payer: Self-pay

## 2015-04-09 ENCOUNTER — Emergency Department (HOSPITAL_COMMUNITY)
Admission: EM | Admit: 2015-04-09 | Discharge: 2015-04-09 | Disposition: A | Discharge status: Home / Self Care | Payer: Medicare Other | Attending: Emergency Medicine | Admitting: Emergency Medicine

## 2015-04-09 DIAGNOSIS — Z79899 Other long term (current) drug therapy: Secondary | ICD-10-CM | POA: Insufficient documentation

## 2015-04-09 DIAGNOSIS — R0602 Shortness of breath: Secondary | ICD-10-CM | POA: Diagnosis present

## 2015-04-09 DIAGNOSIS — R1031 Right lower quadrant pain: Secondary | ICD-10-CM | POA: Diagnosis not present

## 2015-04-09 DIAGNOSIS — R300 Dysuria: Secondary | ICD-10-CM | POA: Insufficient documentation

## 2015-04-09 DIAGNOSIS — M549 Dorsalgia, unspecified: Secondary | ICD-10-CM | POA: Insufficient documentation

## 2015-04-09 DIAGNOSIS — I10 Essential (primary) hypertension: Secondary | ICD-10-CM | POA: Diagnosis not present

## 2015-04-09 DIAGNOSIS — R079 Chest pain, unspecified: Secondary | ICD-10-CM | POA: Diagnosis not present

## 2015-04-09 DIAGNOSIS — R109 Unspecified abdominal pain: Secondary | ICD-10-CM

## 2015-04-09 DIAGNOSIS — F419 Anxiety disorder, unspecified: Secondary | ICD-10-CM | POA: Insufficient documentation

## 2015-04-09 DIAGNOSIS — J45901 Unspecified asthma with (acute) exacerbation: Secondary | ICD-10-CM | POA: Diagnosis not present

## 2015-04-09 LAB — URINALYSIS, ROUTINE W REFLEX MICROSCOPIC
BILIRUBIN URINE: NEGATIVE
GLUCOSE, UA: NEGATIVE mg/dL
Hgb urine dipstick: NEGATIVE
KETONES UR: NEGATIVE mg/dL
Leukocytes, UA: NEGATIVE
Nitrite: NEGATIVE
PH: 5.5 (ref 5.0–8.0)
PROTEIN: NEGATIVE mg/dL
SPECIFIC GRAVITY, URINE: 1.005 (ref 1.005–1.030)
UROBILINOGEN UA: 0.2 mg/dL (ref 0.0–1.0)

## 2015-04-09 MED ORDER — IPRATROPIUM-ALBUTEROL 0.5-2.5 (3) MG/3ML IN SOLN
3.0000 mL | Freq: Once | RESPIRATORY_TRACT | Status: AC
  Administered 2015-04-09: 3 mL via RESPIRATORY_TRACT
  Filled 2015-04-09: qty 3

## 2015-04-09 MED ORDER — DEXAMETHASONE 1 MG/ML PO CONC
12.0000 mg | Freq: Once | ORAL | Status: AC
  Administered 2015-04-09: 12 mg via ORAL
  Filled 2015-04-09: qty 12

## 2015-04-09 MED ORDER — LORAZEPAM 1 MG PO TABS
1.0000 mg | ORAL_TABLET | Freq: Once | ORAL | Status: AC
  Administered 2015-04-09: 1 mg via ORAL
  Filled 2015-04-09: qty 1

## 2015-04-09 NOTE — ED Provider Notes (Signed)
CSN: [id number]     Arrival date & time 04/09/15  0122 History   This chart was scribed for Marcus Mocha, MD by Arlan Organ, ED Scribe. This patient was seen in room D32C/D32C and the patient's care was started 2:13 AM.   Chief Complaint  Patient presents with  . Dysuria  . Anxiety   Patient is a 44 y.o. male presenting with abdominal pain. The history is provided by the patient. No language interpreter was used.  Abdominal Pain Pain location:  R flank and RLQ Pain quality: sharp   Pain radiates to:  Back Pain severity:  Moderate Onset quality:  Sudden Duration:  8 weeks Timing:  Intermittent Progression:  Unchanged Chronicity:  Recurrent Relieved by:  None tried Worsened by:  Nothing tried Ineffective treatments:  None tried Associated symptoms: shortness of breath   Associated symptoms: no chills, no dysuria, no fever, no nausea and no vomiting     HPI Comments: [name] brought in by EMS is a 44 y.o. male with a PMHx of anxiety, Bipolar 1 disorder, asthma, HTN, depression, and PTSD who presents to the Emergency Department complaining of constant, ongoing, non-radiating R sided abdominal pain that radiates to the R flank/lower back x 2 hours prior to arrival. Currently pain is rated 7/10. Patient attributes his flank pain to his PTSD. He reports he gets this pain when he's stressed out and it goes away when his stress improves.  [name] also reports ongoing, intermittent anxiety that is chronic in nature for him. [name] attributes episodes of anxiety to his mothers recent passing a few months ago. [name] reports intermittent, ongoing shortness of breath, wheezing, and chest tightness. He admits to self medicating by consuming alcohol when feeling severely anxious. No recent fever, chills, dysuria, or hematuria. [name] is not currently followed by a PCP and ran out of all medications a few months ago including Lorazepam. No known allergies to medications.  Past Medical History   Diagnosis Date  . Anxiety   . Bipolar 1 disorder   . Asthma   . Hypertension   . Depression   . PTSD (post-traumatic stress disorder)   . Heart palpitations     with severe anxiety   Past Surgical History  Procedure Laterality Date  . No past surgeries     No family history on file. History  Substance Use Topics  . Smoking status: Never Smoker   . Smokeless tobacco: Never Used  . Alcohol Use: 3.6 oz/week    1 Glasses of wine, 5 Cans of beer, 0 Shots of liquor, 0 Standard drinks or equivalent per week     Comment: binger on etoh and cocaine    Review of Systems  Constitutional: Negative for fever and chills.  Respiratory: Positive for chest tightness, shortness of breath and wheezing.   Gastrointestinal: Positive for abdominal pain. Negative for nausea and vomiting.  Genitourinary: Positive for flank pain. Negative for dysuria.  Musculoskeletal: Positive for back pain.  Psychiatric/Behavioral: The patient is nervous/anxious.   All other systems reviewed and are negative.     Allergies  Review of patient's allergies indicates no known allergies.  Home Medications   Prior to Admission medications   Medication Sig Start Date End Date Taking? Authorizing Provider  albuterol (PROVENTIL HFA;VENTOLIN HFA) 108 (90 BASE) MCG/ACT inhaler Inhale 2 puffs into the lungs every 6 (six) hours as needed for wheezing or shortness of breath. 04/27/13   Tamala Julian, PA-C  lisinopril (PRINIVIL,ZESTRIL) 20 MG  tablet Take 1 tablet (20 mg total) by mouth daily. For hypertension control 04/27/13   Tamala Julian, PA-C   Triage Vitals: BP 132/91 mmHg  Pulse 80  Temp(Src) 99.1 F (37.3 C)  Resp 16  Ht  (1.727 m)  Wt 270 lb (122.471 kg)  BMI 41.06 kg/m2  SpO2 96%   Physical Exam  Constitutional: He is oriented to person, place, and time. He appears well-developed and well-nourished. No distress.  HENT:  Head: Normocephalic and atraumatic.  Mouth/Throat: Oropharynx is clear  and moist. No oropharyngeal exudate.  Eyes: EOM are normal. Pupils are equal, round, and reactive to light.  Neck: Normal range of motion. Neck supple.  Cardiovascular: Normal rate, regular rhythm, normal heart sounds and intact distal pulses.  Exam reveals no friction rub.   No murmur heard. Pulmonary/Chest: Effort normal and breath sounds normal. No respiratory distress. He has no wheezes. He has no rales.  Abdominal: Soft. He exhibits no distension. There is no tenderness. There is no rebound.  Musculoskeletal: Normal range of motion. He exhibits no edema.  Neurological: He is alert and oriented to person, place, and time. No cranial nerve deficit. He exhibits normal muscle tone.  Skin: Skin is warm and dry. No rash noted. He is not diaphoretic.  Psychiatric: He has a normal mood and affect. Judgment normal.  Nursing note and vitals reviewed.   ED Course  Procedures (including critical care time)  DIAGNOSTIC STUDIES: Oxygen Saturation is 96% on RA, adequate by my interpretation.    COORDINATION OF CARE: 2:18 AM- Will give Decadron and breathing treatment. Will order urinalysis. Discussed treatment plan with [name] at bedside and [name] agreed to plan.     Labs Review Labs Reviewed - No data to display  Imaging Review No results found.   EKG Interpretation None      MDM   Final diagnoses:  Anxiety  Abdominal pain, unspecified abdominal location  Asthma exacerbation    76M with hx of anxiety, PTSD presents with R flank pain and mild wheezing.  R flank pain attributed to stress, has is often for past several months. No fever, vomiting, diarrhea. No urinary symptoms. UA negative. Doubt kidney stone. No tenderness on exam. Doesn't need imaging at this time. Has some mild wheezing here, no respiratory distress. Decadron and duoneb given.  Urine ok. Feeling better. Stable for discharge.  I personally performed the services described in this documentation, which was scribed in my  presence. The recorded information has been reviewed and is accurate.     Marcus Mocha, MD 04/10/15 [phone]

## 2015-04-09 NOTE — ED Notes (Signed)
Pt stable, ambulatory, states understanding of discharge instructions 

## 2015-04-09 NOTE — Discharge Instructions (Signed)

## 2015-04-09 NOTE — ED Notes (Signed)
Pt comes in by EMS bc he had an anxiety attack tonight as well as right lower back pain and dysuria for a month.  Pt has also been out of his medications for the last three months as he does not have a PCP.  Pt was asleep in room just prior to triage.

## 2015-04-12 ENCOUNTER — Encounter (HOSPITAL_COMMUNITY): Payer: Self-pay

## 2015-04-12 ENCOUNTER — Encounter (HOSPITAL_COMMUNITY): Payer: Self-pay | Admitting: *Deleted

## 2015-04-12 ENCOUNTER — Inpatient Hospital Stay (HOSPITAL_COMMUNITY)
Admission: AD | Admit: 2015-04-12 | Discharge: 2015-04-20 | DRG: 885 | Disposition: A | Discharge status: Home / Self Care | Payer: Medicare Other | Source: Intra-hospital | Attending: Psychiatry | Admitting: Psychiatry

## 2015-04-12 ENCOUNTER — Emergency Department (HOSPITAL_COMMUNITY)
Admission: EM | Admit: 2015-04-12 | Discharge: 2015-04-12 | Disposition: A | Discharge status: Other Acute Inpatient Hospital | Payer: Medicare Other | Attending: Emergency Medicine | Admitting: Emergency Medicine

## 2015-04-12 DIAGNOSIS — I10 Essential (primary) hypertension: Secondary | ICD-10-CM | POA: Diagnosis present

## 2015-04-12 DIAGNOSIS — H109 Unspecified conjunctivitis: Secondary | ICD-10-CM | POA: Insufficient documentation

## 2015-04-12 DIAGNOSIS — J45909 Unspecified asthma, uncomplicated: Secondary | ICD-10-CM | POA: Diagnosis present

## 2015-04-12 DIAGNOSIS — F515 Nightmare disorder: Secondary | ICD-10-CM | POA: Diagnosis present

## 2015-04-12 DIAGNOSIS — F1020 Alcohol dependence, uncomplicated: Secondary | ICD-10-CM | POA: Diagnosis present

## 2015-04-12 DIAGNOSIS — G4700 Insomnia, unspecified: Secondary | ICD-10-CM | POA: Diagnosis present

## 2015-04-12 DIAGNOSIS — F1410 Cocaine abuse, uncomplicated: Secondary | ICD-10-CM | POA: Diagnosis not present

## 2015-04-12 DIAGNOSIS — Z79899 Other long term (current) drug therapy: Secondary | ICD-10-CM | POA: Insufficient documentation

## 2015-04-12 DIAGNOSIS — F419 Anxiety disorder, unspecified: Secondary | ICD-10-CM | POA: Diagnosis not present

## 2015-04-12 DIAGNOSIS — F1420 Cocaine dependence, uncomplicated: Secondary | ICD-10-CM | POA: Diagnosis present

## 2015-04-12 DIAGNOSIS — F4310 Post-traumatic stress disorder, unspecified: Secondary | ICD-10-CM | POA: Diagnosis present

## 2015-04-12 DIAGNOSIS — Z7951 Long term (current) use of inhaled steroids: Secondary | ICD-10-CM

## 2015-04-12 DIAGNOSIS — R45851 Suicidal ideations: Secondary | ICD-10-CM | POA: Diagnosis present

## 2015-04-12 DIAGNOSIS — F1490 Cocaine use, unspecified, uncomplicated: Secondary | ICD-10-CM | POA: Diagnosis not present

## 2015-04-12 DIAGNOSIS — F1424 Cocaine dependence with cocaine-induced mood disorder: Secondary | ICD-10-CM | POA: Insufficient documentation

## 2015-04-12 DIAGNOSIS — Z590 Homelessness: Secondary | ICD-10-CM

## 2015-04-12 DIAGNOSIS — F1010 Alcohol abuse, uncomplicated: Secondary | ICD-10-CM | POA: Diagnosis not present

## 2015-04-12 DIAGNOSIS — F410 Panic disorder [episodic paroxysmal anxiety] without agoraphobia: Secondary | ICD-10-CM | POA: Diagnosis present

## 2015-04-12 DIAGNOSIS — F332 Major depressive disorder, recurrent severe without psychotic features: Secondary | ICD-10-CM | POA: Diagnosis not present

## 2015-04-12 LAB — CBC
HEMATOCRIT: 44.2 % (ref 39.0–52.0)
Hemoglobin: 14.7 g/dL (ref 13.0–17.0)
MCH: 27.3 pg (ref 26.0–34.0)
MCHC: 33.3 g/dL (ref 30.0–36.0)
MCV: 82.2 fL (ref 78.0–100.0)
Platelets: 286 10*3/uL (ref 150–400)
RBC: 5.38 MIL/uL (ref 4.22–5.81)
RDW: 14.1 % (ref 11.5–15.5)
WBC: 9.2 10*3/uL (ref 4.0–10.5)

## 2015-04-12 LAB — COMPREHENSIVE METABOLIC PANEL
ALBUMIN: 4.2 g/dL (ref 3.5–5.0)
ALT: 17 U/L (ref 17–63)
AST: 18 U/L (ref 15–41)
Alkaline Phosphatase: 115 U/L (ref 38–126)
Anion gap: 10 (ref 5–15)
BILIRUBIN TOTAL: 0.4 mg/dL (ref 0.3–1.2)
BUN: 7 mg/dL (ref 6–20)
CHLORIDE: 103 mmol/L (ref 101–111)
CO2: 24 mmol/L (ref 22–32)
Calcium: 8.5 mg/dL — ABNORMAL LOW (ref 8.9–10.3)
Creatinine, Ser: 0.84 mg/dL (ref 0.61–1.24)
GFR calc Af Amer: >60 mL/min (ref >60)
GFR calc non Af Amer: >60 mL/min (ref >60)
Glucose, Bld: 106 mg/dL — ABNORMAL HIGH (ref 65–99)
POTASSIUM: 3.4 mmol/L — AB (ref 3.5–5.1)
SODIUM: 137 mmol/L (ref 135–145)
TOTAL PROTEIN: 7.8 g/dL (ref 6.5–8.1)

## 2015-04-12 LAB — ACETAMINOPHEN LEVEL

## 2015-04-12 LAB — RAPID URINE DRUG SCREEN, HOSP PERFORMED
Amphetamines: NOT DETECTED
BARBITURATES: NOT DETECTED
BENZODIAZEPINES: NOT DETECTED
COCAINE: POSITIVE — AB
Opiates: NOT DETECTED
TETRAHYDROCANNABINOL: NOT DETECTED

## 2015-04-12 LAB — SALICYLATE LEVEL

## 2015-04-12 LAB — ETHANOL: Alcohol, Ethyl (B): 9 mg/dL — ABNORMAL HIGH (ref <5)

## 2015-04-12 MED ORDER — CHLORDIAZEPOXIDE HCL 25 MG PO CAPS
25.0000 mg | ORAL_CAPSULE | Freq: Every day | ORAL | Status: AC
  Administered 2015-04-16: 25 mg via ORAL
  Filled 2015-04-12: qty 1

## 2015-04-12 MED ORDER — LOPERAMIDE HCL 2 MG PO CAPS: 2.0000 mg | ORAL_CAPSULE | ORAL | Status: AC | PRN

## 2015-04-12 MED ORDER — CHLORDIAZEPOXIDE HCL 25 MG PO CAPS
25.0000 mg | ORAL_CAPSULE | ORAL | Status: AC
  Administered 2015-04-15 (×2): 25 mg via ORAL
  Filled 2015-04-12 (×2): qty 1

## 2015-04-12 MED ORDER — VITAMIN B-1 100 MG PO TABS
100.0000 mg | ORAL_TABLET | Freq: Every day | ORAL | Status: DC
  Administered 2015-04-13 – 2015-04-20 (×8): 100 mg via ORAL
  Filled 2015-04-12 (×9): qty 1

## 2015-04-12 MED ORDER — HYDROXYZINE HCL 25 MG PO TABS
25.0000 mg | ORAL_TABLET | Freq: Four times a day (QID) | ORAL | Status: AC | PRN
Start: 2015-04-12 — End: 2015-04-15
  Administered 2015-04-14: 25 mg via ORAL
  Filled 2015-04-12 (×2): qty 1

## 2015-04-12 MED ORDER — ALUM & MAG HYDROXIDE-SIMETH 200-200-20 MG/5ML PO SUSP
30.0000 mL | ORAL | Status: DC | PRN
  Administered 2015-04-13: 30 mL via ORAL
  Filled 2015-04-12: qty 30

## 2015-04-12 MED ORDER — ENSURE ENLIVE PO LIQD
237.0000 mL | Freq: Two times a day (BID) | ORAL | Status: DC
  Administered 2015-04-12 – 2015-04-19 (×14): 237 mL via ORAL

## 2015-04-12 MED ORDER — MAGNESIUM HYDROXIDE 400 MG/5ML PO SUSP: 30.0000 mL | Freq: Every day | ORAL | Status: DC | PRN

## 2015-04-12 MED ORDER — ADULT MULTIVITAMIN W/MINERALS CH
1.0000 | ORAL_TABLET | Freq: Every day | ORAL | Status: DC
  Administered 2015-04-12 – 2015-04-20 (×9): 1 via ORAL
  Filled 2015-04-12 (×11): qty 1

## 2015-04-12 MED ORDER — ACETAMINOPHEN 325 MG PO TABS
650.0000 mg | ORAL_TABLET | Freq: Four times a day (QID) | ORAL | Status: DC | PRN
  Administered 2015-04-12 – 2015-04-13 (×2): 650 mg via ORAL
  Filled 2015-04-12 (×2): qty 2

## 2015-04-12 MED ORDER — THIAMINE HCL 100 MG/ML IJ SOLN
100.0000 mg | Freq: Once | INTRAMUSCULAR | Status: AC
  Administered 2015-04-12: 100 mg via INTRAMUSCULAR
  Filled 2015-04-12: qty 2

## 2015-04-12 MED ORDER — POTASSIUM CHLORIDE CRYS ER 10 MEQ PO TBCR
10.0000 meq | EXTENDED_RELEASE_TABLET | Freq: Once | ORAL | Status: AC
  Administered 2015-04-12: 10 meq via ORAL
  Filled 2015-04-12: qty 1

## 2015-04-12 MED ORDER — CHLORDIAZEPOXIDE HCL 25 MG PO CAPS
25.0000 mg | ORAL_CAPSULE | Freq: Four times a day (QID) | ORAL | Status: AC
  Administered 2015-04-12 – 2015-04-13 (×6): 25 mg via ORAL
  Filled 2015-04-12 (×6): qty 1

## 2015-04-12 MED ORDER — PNEUMOCOCCAL 13-VAL CONJ VACC IM SUSP
0.5000 mL | INTRAMUSCULAR | Status: DC
  Filled 2015-04-12: qty 0.5

## 2015-04-12 MED ORDER — ONDANSETRON 4 MG PO TBDP: 4.0000 mg | ORAL_TABLET | Freq: Four times a day (QID) | ORAL | Status: AC | PRN

## 2015-04-12 MED ORDER — CHLORDIAZEPOXIDE HCL 25 MG PO CAPS
25.0000 mg | ORAL_CAPSULE | Freq: Four times a day (QID) | ORAL | Status: AC | PRN
  Administered 2015-04-15: 25 mg via ORAL
  Filled 2015-04-12 (×2): qty 1

## 2015-04-12 MED ORDER — LISINOPRIL 20 MG PO TABS
20.0000 mg | ORAL_TABLET | Freq: Once | ORAL | Status: AC
  Administered 2015-04-12: 20 mg via ORAL
  Filled 2015-04-12: qty 1

## 2015-04-12 MED ORDER — CHLORDIAZEPOXIDE HCL 25 MG PO CAPS
25.0000 mg | ORAL_CAPSULE | Freq: Three times a day (TID) | ORAL | Status: AC
  Administered 2015-04-14 (×3): 25 mg via ORAL
  Filled 2015-04-12 (×3): qty 1
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
## 2015-04-12 NOTE — ED Provider Notes (Signed)
CSN: [id number]     Arrival date & time 04/12/15  0601 History   First MD Initiated Contact with Patient 04/12/15 0715     Chief Complaint  Patient presents with  . Suicidal     (Consider location/radiation/quality/duration/timing/severity/associated sxs/prior Treatment) The history is provided by the patient. No language interpreter was used.  [name] is a 44 year old male with a history of hypertension, PTSD, depression, asthma, bipolar, and anxiety who presents for suicidal ideation but without a plan. He states his mother died in Jan 02, 2023 and he has PTSD since then. He also states that he has been homeless and sleeping at a friend's house. After his mother died he went into relapse and began doing cocaine again and binge drinking.  He admits to having over 12 beers and doing cocaine yesterday. He says that he is hearing voices and says this is most likely to due to his drinking. He denies that the voices are telling him to do anything or to harm himself or others. He denies any HI. He denies any fever, chest pain, shortness of breath, cough, abdominal pain, nausea, or vomiting.  Past Medical History  Diagnosis Date  . Anxiety   . Bipolar 1 disorder   . Asthma   . Hypertension   . Depression   . PTSD (post-traumatic stress disorder)   . Heart palpitations     with severe anxiety   Past Surgical History  Procedure Laterality Date  . No past surgeries     History reviewed. No pertinent family history. Social History  Substance Use Topics  . Smoking status: Never Smoker   . Smokeless tobacco: Never Used  . Alcohol Use: 3.6 oz/week    1 Glasses of wine, 5 Cans of beer, 0 Shots of liquor, 0 Standard drinks or equivalent per week     Comment: binger on etoh and cocaine    Review of Systems  Constitutional: Negative for fever.  Respiratory: Negative for shortness of breath.   Cardiovascular: Negative for chest pain.  Gastrointestinal: Negative for abdominal pain.   Psychiatric/Behavioral: Positive for suicidal ideas and hallucinations.  All other systems reviewed and are negative.     Allergies  Review of patient's allergies indicates no known allergies.  Home Medications   Prior to Admission medications   Medication Sig Start Date End Date Taking? Authorizing Provider  albuterol (PROVENTIL HFA;VENTOLIN HFA) 108 (90 BASE) MCG/ACT inhaler Inhale 2 puffs into the lungs every 6 (six) hours as needed for wheezing or shortness of breath. 04/27/13  Yes Lloyd Huger T Mashburn, PA-C  lisinopril (PRINIVIL,ZESTRIL) 20 MG tablet Take 1 tablet (20 mg total) by mouth daily. For hypertension control Patient not taking: Reported on 04/12/2015 04/27/13   Lloyd Huger T Mashburn, PA-C   BP 169/109 mmHg  Pulse 103  Temp(Src) 98.5 F (36.9 C) (Oral)  Resp 20  SpO2 95% Physical Exam  Constitutional: He is oriented to person, place, and time. He appears well-developed and well-nourished.  HENT:  Head: Normocephalic and atraumatic.  Eyes: Right conjunctiva is injected. Left conjunctiva is injected.  Bilateral erythematous conjunctivae but no hemorrhage.  Neck: Neck supple.  Cardiovascular: Normal rate.   Pulmonary/Chest: Effort normal. No respiratory distress.  Abdominal: Soft.  Musculoskeletal: Normal range of motion.  Neurological: He is alert and oriented to person, place, and time.  Skin: Skin is warm and dry.  Several scars on bilateral forearms representing self mutilation and cutting. No new lacerations.  Psychiatric: His speech is normal. He is actively hallucinating. He  expresses suicidal ideation. He expresses no homicidal ideation. He expresses no suicidal plans.  Nursing note and vitals reviewed.   ED Course  Procedures (including critical care time) Labs Review Labs Reviewed  COMPREHENSIVE METABOLIC PANEL - Abnormal; Notable for the following:    Potassium 3.4 (*)    Glucose, Bld 106 (*)    Calcium 8.5 (*)    All other components within normal limits   ETHANOL - Abnormal; Notable for the following:    Alcohol, Ethyl (B) 9 (*)    All other components within normal limits  ACETAMINOPHEN LEVEL - Abnormal; Notable for the following:    Acetaminophen (Tylenol), Serum <10 (*)    All other components within normal limits  URINE RAPID DRUG SCREEN, HOSP PERFORMED - Abnormal; Notable for the following:    Cocaine POSITIVE (*)    All other components within normal limits  SALICYLATE LEVEL  CBC    Imaging Review No results found.   EKG Interpretation None      MDM   Final diagnoses:  Suicidal ideation  Cocaine abuse  Patient presents for cocaine abuse and SI without a plan.  Vitals are stable.  He is in no acute distress and is cooperative.  Labs positive for cocaine but otherwise not concerning.  Medications  potassium chloride (K-DUR,KLOR-CON) CR tablet 10 mEq (10 mEq Oral Given 04/12/15 0741)  Psych hold orders placed. Patient has been medically cleared for psych evaluation.   Catha Gosselin, PA-C 04/12/15 8119  Lavera Guise, MD 04/12/15 [phone]

## 2015-04-12 NOTE — BH Assessment (Signed)
[facility] Assessment Progress Note  Per Thedore Mins, MD, this pt requires psychiatric hospitalization at this time.  Berneice Heinrich, RN, [facility] has assigned pt to [facility] Rm 304-1.  Pt has signed Voluntary Admission and Consent for Treatment, as well as Consent to Release Information, and signed forms have been faxed to [facility].  Pt's nurse has been notified, and agrees to send original paperwork along with pt via [facility], and to call report to [phone].  Doylene Canning, MA Triage Specialist [phone]
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
## 2015-04-12 NOTE — Progress Notes (Signed)
44 yr [place] pt confirmed with ED CM that he does not have a pcp, ACT nor counselor CM provided pt with a 7 page list of medicare doctors within [place] [id number] and resources  During ED CM interaction with pt he was pleasant, Initially he had the covers over his head but he pulled them down and smile prior to Cm leaving his room and encouraging him to eat the lunch that had been left on his table.  Pt appreciative of services and resources provided Pt expressed this by saying "thank you ma'am"

## 2015-04-12 NOTE — Progress Notes (Signed)
Patient did not attend karaoke, has been sleeping.

## 2015-04-12 NOTE — ED Notes (Signed)
Pt was picked up at a gas station and said he wanted to kill himself, he also admitted to drinking two 40 ozs and doing cocaine

## 2015-04-12 NOTE — Tx Team (Signed)
Initial Interdisciplinary Treatment Plan   PATIENT STRESSORS: Financial difficulties Occupational concerns Substance abuse Traumatic event   PATIENT STRENGTHS: Average or above average intelligence Communication skills   PROBLEM LIST: Problem List/Patient Goals Date to be addressed Date deferred Reason deferred Estimated date of resolution  "I need to deal with my depression and grief" 04/12/2015     homelessness 04/12/2015     Lack of support and resources 04/12/2015     Suicidal Ideation 04/12/2015     Unresolved grief 04/12/2015                              DISCHARGE CRITERIA:  Adequate post-discharge living arrangements Improved stabilization in mood, thinking, and/or behavior Motivation to continue treatment in a less acute level of care Reduction of life-threatening or endangering symptoms to within safe limits Verbal commitment to aftercare and medication compliance Withdrawal symptoms are absent or subacute and managed without 24-hour nursing intervention  PRELIMINARY DISCHARGE PLAN: Attend 12-step recovery group Placement in alternative living arrangements  PATIENT/FAMIILY INVOLVEMENT: This treatment plan has been presented to and reviewed with the patient, [name].  The patient and family have been given the opportunity to ask questions and make suggestions.  [name] Mon 04/12/2015, 4:29 PM

## 2015-04-12 NOTE — Progress Notes (Signed)
Nursing admission note:  Patient is a 44 yo male that presented to [facility] with increased depression and suicidal thoughts.  Patient found his mother's body this past March and is dealing with some unresolved grief and PTSD.  Patient does not indicate the source of his PTSD.  He reports drinking 7-8 16 oz beers daily with some occasional liquor.  He states he stayed sober for 2 years and relapsed when he found his deceased mother.  Patient states, "I don't want to live anymore."  Patient reports passive SI with no specific plan.  He denies AVH/HI.  Patient is homeless.  He is tired and wants to sleep.  His CIWA is a 7; he was started on the librium protocol.  Medical hx includes seizures, asthma and HTN.

## 2015-04-12 NOTE — ED Notes (Signed)
Full report given to [facility] RN. Pt BP slightly elevated and PO LIsinopril 20 mg given as per MD. Juel Burrow transported pt to [facility].

## 2015-04-13 ENCOUNTER — Encounter (HOSPITAL_COMMUNITY): Payer: Self-pay | Admitting: Psychiatry

## 2015-04-13 DIAGNOSIS — F4310 Post-traumatic stress disorder, unspecified: Secondary | ICD-10-CM

## 2015-04-13 DIAGNOSIS — F332 Major depressive disorder, recurrent severe without psychotic features: Principal | ICD-10-CM | POA: Diagnosis present

## 2015-04-13 DIAGNOSIS — R45851 Suicidal ideations: Secondary | ICD-10-CM

## 2015-04-13 DIAGNOSIS — F1010 Alcohol abuse, uncomplicated: Secondary | ICD-10-CM

## 2015-04-13 DIAGNOSIS — F1410 Cocaine abuse, uncomplicated: Secondary | ICD-10-CM

## 2015-04-13 MED ORDER — PNEUMOCOCCAL VAC POLYVALENT 25 MCG/0.5ML IJ INJ
0.5000 mL | INJECTION | Freq: Once | INTRAMUSCULAR | Status: AC
  Administered 2015-04-13: 0.5 mL via INTRAMUSCULAR

## 2015-04-13 MED ORDER — PNEUMOCOCCAL VAC POLYVALENT 25 MCG/0.5ML IJ INJ: 0.5000 mL | INJECTION | INTRAMUSCULAR | Status: DC

## 2015-04-13 NOTE — Progress Notes (Signed)
Recreation Therapy Notes  Date: 08.12.16 Time: 9:30 am Location: 300 Hall Group Room  Group Topic: Stress Management  Goal Area(s) Addresses:  Patient will verbalize importance of using healthy stress management.  Patient will identify positive emotions associated with healthy stress management.   Intervention: Stress Management  Activity :  Guided Imagery Script.  LRT will introduce and educate the patients on the stress management technique of guided imagery.  A script was used to deliver the technique to the patients.  Patients were asked to follow the script read aloud by the LRT to engage in the stress management technique.  Education:  Stress Management, Discharge Planning.   Education Outcome: Acknowledges edcuation/In group clarification offered/Needs additional education  Clinical Observations/Feedback: Patient did not attend group.   Quinita Kostelecky, LRT/CTRS         Noemie Devivo A 04/13/2015 4:03 PM 

## 2015-04-13 NOTE — Progress Notes (Signed)
NUTRITION ASSESSMENT  Pt identified as at risk on the Malnutrition Screen Tool  INTERVENTION: Encourage PO intake Supplements: Ensure Enlive po BID, each supplement provides 350 kcal and 20 grams of protein   NUTRITION DIAGNOSIS: Unintentional weight loss related to sub-optimal intake as evidenced by pt report.   Goal: Pt to meet >/= 90% of their estimated nutrition needs.  Monitor:  PO intake  Assessment:  Pt reports withdrawal symptoms per staff. Pt is homeless and has been drinking 7-8 beers per day since March.  Encourage PO intake.  Supplements ordered per protocol.   44 y.o. male  Height: Ht Readings from Last 1 Encounters:  04/12/15  (1.727 m)    Weight: Wt Readings from Last 1 Encounters:  04/12/15 255 lb (115.667 kg)    Weight Hx: Wt Readings from Last 10 Encounters:  04/12/15 255 lb (115.667 kg)  04/09/15 270 lb (122.471 kg)  12/16/13 270 lb (122.471 kg)  04/26/13 260 lb (117.935 kg)  04/06/13 256 lb (116.121 kg)  04/05/13 255 lb (115.667 kg)  10/11/11 260 lb (117.935 kg)  09/07/11 253 lb (114.76 kg)    BMI:  Body mass index is 38.78 kg/(m^2). Pt meets criteria for obesity class II based on current BMI.  Estimated Nutritional Needs: Kcal: 25-30 kcal/kg Protein: > 1 gram protein/kg Fluid: 1 ml/kcal  Diet Order:   Pt is also offered choice of unit snacks mid-morning and mid-afternoon.  Pt is eating as desired.   Lab results and medications reviewed.   Kendell Bane RD, LDN, CNSC [phone] Pager [phone] After Hours Pager

## 2015-04-13 NOTE — Tx Team (Signed)
Interdisciplinary Treatment Plan Update (Adult)  Date:  04/13/2015  Time Reviewed:  8:29 AM   Progress in Treatment: Attending groups: Yes. Participating in groups:  Yes. Taking medication as prescribed:  Yes. Tolerating medication:  Yes. Family/Significant othe contact made:  Not yet. SPE required for this pt.  Patient understands diagnosis:  Yes. and As evidenced by:  seeking treatment for SI, depression, PTSD symptoms, ETOH/cocaine abuse. Discussing patient identified problems/goals with staff:  Yes. Medical problems stabilized or resolved:  Yes. Denies suicidal/homicidal ideation: Yes. Issues/concerns per patient self-inventory:  Other:  Discharge Plan or Barriers:   Reason for Continuation of Hospitalization: Depression Medication stabilization Withdrawal symptoms  Comments:  [name] is an 44 y.o. male presenting to [facility] with increased depression and suicidal thoughts. Patient reports feelings of hopelessness, fatigue, isolating self from others, and crying spells. Sts that he is having a hard time getting past the death of his mother. His mother passed away 23-Nov-2014. Sts that he found his mothers deceased body which also brought up issues related to his PTSD. Patient reports suicidal thoughts ongoing for the past 2 months. He has no desire to live. Denies a suicidal plan but is unable to contract for safety. No access to firearms or means. He denies HI and AVH's. He also reports self medicating self with alcohol and cocaine. 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 Onset was age 70. Patient also has a history of black outs. Patient hospitalized at [facility] multiple times. He was also hospitalized at [facility]. He does not have a current psychiatrist or therapist. Sts he was receiving ACT services from [facility] months ago but he was discharged. Patient sts he doesn't know why he was discharged. Major Depressive Disorder, Recurrent, Severe, without psychotic features and  Substance Abuse  Estimated length of stay:  3-5 days   Additional Comments:  Patient and CSW reviewed pt's identified goals and treatment plan. Patient verbalized understanding and agreed to treatment plan. CSW reviewed [facility] "Discharge Process and Patient Involvement" Form. Pt verbalized understanding of information provided and signed form.    Review of initial/current patient goals per problem list:  1. Goal(s): Patient will participate in aftercare plan  Met: No.   Target date: at discharge  As evidenced by: Patient will participate within aftercare plan AEB aftercare provider and housing plan at discharge being identified.  8/12: Pt expressed interest in moving into sober living house. CSW assessing for appropriate referrals.   2. Goal (s): Patient will exhibit decreased depressive symptoms and suicidal ideations.  Met: No.    Target date: at discharge  As evidenced by: Patient will utilize self rating of depression at 3 or below and demonstrate decreased signs of depression or be deemed stable for discharge by MD.  8/12: Pt rates depression as 8/10 and presents with depressed mood/flat affect. No SI/HI/AVH reported this morning.   3. Goal(s): Patient will demonstrate decreased signs of withdrawal due to substance abuse  Met:No.   Target date:at discharge   As evidenced by: Patient will produce a CIWA/COWS score of 0, have stable vitals signs, and no symptoms of withdrawal.  8/12: pt reports no signs of withdrawal today but is currently on librium taper. No CIWA/COWS taken. Stable vitals. Goal progressing.    Attendees: Patient:   04/13/2015 8:29 AM   Family:   04/13/2015 8:29 AM   Physician:  Dr. Carlton Adam, MD 04/13/2015 8:29 AM   Nursing:   Chrys Racer RN; Jan RN 04/13/2015 8:29 AM  Clinical Social Worker: [place], [place]  04/13/2015 8:29 AM   Clinical Social Worker: Erasmo Downer Drinkard LCSWA; Peri Maris LCSWA 04/13/2015 8:29 AM   Other:  Gerline Legacy Nurse  Case Manager 04/13/2015 8:29 AM   Other:  Lucinda Dell; [facility] TCT  04/13/2015 8:29 AM   Other:   04/13/2015 8:29 AM   Other:  04/13/2015 8:29 AM   Other:  04/13/2015 8:29 AM   Other:  04/13/2015 8:29 AM    04/13/2015 8:29 AM    04/13/2015 8:29 AM    04/13/2015 8:29 AM    04/13/2015 8:29 AM    Scribe for Treatment Team:   Maxie Better, LCSWA  04/13/2015 8:29 AM

## 2015-04-13 NOTE — [facility] Group Notes (Signed)
[facility] LCSW Group Therapy  04/13/2015 1:15 PM  Type of Therapy:  Group Therapy  Participation Level:  Did Not Attend-pt stated that he did not feel well and chose to remain in bed.   Summary of Progress/Problems: Feelings around Relapse. Group members discussed the meaning of relapse and shared personal stories of relapse, how it affected them and others, and how they perceived themselves during this time. Group members were encouraged to identify triggers, warning signs and coping skills used when facing the possibility of relapse. Social supports were discussed and explored in detail. Post Acute Withdrawal Syndrome (handout provided) was introduced and examined. Pt's were encouraged to ask questions, talk about key points associated with PAWS, and process this information in terms of relapse prevention.   Smart, [name] LCSWA  04/13/2015, 1:15 PM

## 2015-04-13 NOTE — BHH Suicide Risk Assessment (Signed)
[facility] Admission Suicide Risk Assessment   Nursing information obtained from:  Patient Demographic factors:  Male Current Mental Status:  Suicidal ideation indicated by patient Loss Factors:  Decrease in vocational status, Loss of significant relationship, Financial problems / change in socioeconomic status Historical Factors:  Prior suicide attempts Risk Reduction Factors:  NA Total Time spent with patient: 45 minutes Principal Problem: Severe recurrent major depression without psychotic features Diagnosis:   Patient Active Problem List   Diagnosis Date Noted  . Severe recurrent major depression without psychotic features [F33.2] 04/13/2015  . Alcohol use disorder, severe, dependence [F10.20] 04/12/2015  . Alcohol abuse [F10.10] 04/07/2013  . Cocaine dependence [F14.20] 04/07/2013  . Depressive disorder, not elsewhere classified [F32.9] 04/07/2013  . Anxiety [F41.9] 03/27/2013  . Palpitations [R00.2] 03/27/2013  . Hypertension [I10] 09/08/2011  . Post traumatic stress disorder (PTSD) [F43.10] 09/08/2011  . Polysubstance dependence cocaine & alcohol [F19.20] 08/16/2011     Continued Clinical Symptoms:  Alcohol Use Disorder Identification Test Final Score (AUDIT): 25 The "Alcohol Use Disorders Identification Test", Guidelines for Use in Primary Care, Second Edition.  World Science writer [employer]). Score between 0-7:  no or low risk or alcohol related problems. Score between 8-15:  moderate risk of alcohol related problems. Score between 16-19:  high risk of alcohol related problems. Score 20 or above:  warrants further diagnostic evaluation for alcohol dependence and treatment.   CLINICAL FACTORS:   Depression:   Comorbid alcohol abuse/dependence Alcohol/Substance Abuse/Dependencies  Psychiatric Specialty Exam: Physical Exam  ROS  Blood pressure 128/82, pulse 87, temperature 98.4 F (36.9 C), temperature source Oral, resp. rate 20, height  (1.727 m), weight 115.667 kg  (255 lb).Body mass index is 38.78 kg/(m^2).   COGNITIVE FEATURES THAT CONTRIBUTE TO RISK:  Closed-mindedness, Polarized thinking and Thought constriction (tunnel vision)    SUICIDE RISK:   Moderate:  Frequent suicidal ideation with limited intensity, and duration, some specificity in terms of plans, no associated intent, good self-control, limited dysphoria/symptomatology, some risk factors present, and identifiable protective factors, including available and accessible social support.  PLAN OF CARE: Supportive approach/coping skills                               Alcohol cocaine dependence; Librium detox protocol/work a relapse prevention plan                               Depression; grief loss-explore need for antidepressant                               Explore placement options                                Medical Decision Making:  Review of Psycho-Social Stressors (1), Review of Medication Regimen & Side Effects (2) and Review of New Medication or Change in Dosage (2)  I certify that inpatient services furnished can reasonably be expected to improve the patient's condition.   Theoplis Garciagarcia A 04/13/2015, 4:19 PM

## 2015-04-13 NOTE — Progress Notes (Signed)
Patient ID: [name], male   DOB: 14-Jun-1971, 44 y.o.   MRN: [id number] PER STATE REGULATIONS 482.30  THIS CHART WAS REVIEWED FOR MEDICAL NECESSITY WITH RESPECT TO THE PATIENT'S ADMISSION/DURATION OF STAY.  NEXT REVIEW DATE: 04/16/15  Loura Halt, RN, BSN CASE MANAGER

## 2015-04-13 NOTE — B[facility] Group Notes (Signed)
[facility] Group Notes:  (Nursing/MHT/Case Management/Adjunct)  Date:   04/12/2015  LATE ENTRY Time :   0900  Type of Therapy:  Nurse Education the focus of the group is on helping patients identify  Unhealthy  behaviors  Participation Level:  Active  Participation Quality:  Appropriate  Affect:  Appropriate  Cognitive:  Alert  Insight:  Appropriate  Engagement in Group:  Limited  Modes of Intervention:  Education  Summary of Progress/Problems:  Rich Brave 04/13/2015, 10:39 AM

## 2015-04-13 NOTE — Progress Notes (Signed)
Patient did not attend the evening speaker AA meeting. Pt was notified that group was beginning but remained in bed.   

## 2015-04-13 NOTE — Progress Notes (Signed)
D: Pt was laying in bed resting during the assessment. Stated he was tired, but had no questions or concerns for the Clinical research associate.   A:  Support and encouragement was offered. 15 min checks continued for safety.  R: Pt remains safe.
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
## 2015-04-13 NOTE — Progress Notes (Signed)
D: Patient reports poor sleep.  He did attend group this morning.  He reports withdrawal symptoms of tremors, cravings, agitation and irritability.  His goal today is to "not be anxious and and full of withdrawal."  He rates his depression and hopelessness as a 10; anxiety as a 9.  He remains suicidal with no specific plan.  He denies HI/AVH.  Patient has minimal interaction with staff; he remains isolative to room. A: Continue to monitor medication management and MD orders.  Safety checks completed every 15 minutes per protocol.  Offer support and encouragement as needed. R: patient's behavior is appropriate to situation.

## 2015-04-14 DIAGNOSIS — F1020 Alcohol dependence, uncomplicated: Secondary | ICD-10-CM

## 2015-04-14 DIAGNOSIS — F1490 Cocaine use, unspecified, uncomplicated: Secondary | ICD-10-CM

## 2015-04-14 MED ORDER — MIRTAZAPINE 15 MG PO TABS
15.0000 mg | ORAL_TABLET | Freq: Every day | ORAL | Status: DC
  Administered 2015-04-14 – 2015-04-15 (×2): 15 mg via ORAL
  Filled 2015-04-14 (×4): qty 1

## 2015-04-14 NOTE — Progress Notes (Signed)
D: Patient has remained in bed most of the day, only getting up for breakfast and lunch.  Patient states he is tired and sleepy.  He continues to report severe depressive symptoms and passive SI.  Patient does contract for safety on the unit.  He has minimal withdrawal symptoms and remains on the librium protocol.  He denies HI/AVH.   A: Continue to monitor medication management and MD orders.  Safety checks completed every 15 minutes per protocol.  Offer support and encouragement as needed. R: Patient's behavior is appropriate to situation.

## 2015-04-14 NOTE — Progress Notes (Signed)
D: Pt denies SI/HI/AVH. Pt is pleasant and cooperative. Pt stayed in bed for duration of the shift.   A: Pt was offered support and encouragement. Pt was given scheduled medications. Pt was encourage to attend groups. Q 15 minute checks were done for safety.   R: Pt is taking medication. Pt has no complaints.Pt receptive to treatment and safety maintained on unit.

## 2015-04-14 NOTE — Plan of Care (Signed)
Problem: Alteration in mood & ability to function due to Goal: LTG-Pt reports reduction in suicidal thoughts (Patient reports reduction in suicidal thoughts and is able to verbalize a safety plan for whenever patient is feeling suicidal)  Outcome: Progressing Pt denies SI at this time     

## 2015-04-14 NOTE — Progress Notes (Signed)
[facility] MD Progress Note  04/14/2015 2:38 PM [name]  MRN:  [id number]  Subjective: [name] says, "I feel terrible today. I have the shakes, sweats, bad depression, high anxiety, feeling hopeless. I slept poorly last night. I'm just not at my best today"   Objective: [name] is seen, chart reviewed. He is a 44 year old black male. He says he lost his mother in March of this year. Got so depressed as a result & relapsed on coccaine & alcohol. Idriss says he has been using & drinking a lot x 2 months. He has a hx of self mutilation, stopped cutting himself at age 73. He says he is not sleeping, complained of bad nightmare, increased sweating, feeling hopeless. He is started on Mirtazapine 15 mg Q hs for insomnia/depression.  Principal Problem: Severe recurrent major depression without psychotic features  Diagnosis:   Patient Active Problem List   Diagnosis Date Noted  . Severe recurrent major depression without psychotic features [F33.2] 04/13/2015  . Alcohol use disorder, severe, dependence [F10.20] 04/12/2015  . Alcohol abuse [F10.10] 04/07/2013  . Cocaine dependence [F14.20] 04/07/2013  . Depressive disorder, not elsewhere classified [F32.9] 04/07/2013  . Anxiety [F41.9] 03/27/2013  . Palpitations [R00.2] 03/27/2013  . Hypertension [I10] 09/08/2011  . Post traumatic stress disorder (PTSD) [F43.10] 09/08/2011  . Polysubstance dependence cocaine & alcohol [F19.20] 08/16/2011   Total Time spent with patient: 25 minutes  Past Medical History:  Past Medical History  Diagnosis Date  . Anxiety   . Bipolar 1 disorder   . Asthma   . Hypertension   . Depression   . PTSD (post-traumatic stress disorder)   . Heart palpitations     with severe anxiety    Past Surgical History  Procedure Laterality Date  . No past surgeries     Family History: History reviewed. No pertinent family history.  Social History:  History  Alcohol Use  . 3.6 oz/week  . 1 Glasses of wine, 5 Cans of beer, 0  Shots of liquor, 0 Standard drinks or equivalent per week    Comment: binger on etoh and cocaine     History  Drug Use  . 1.00 per week  . Special: Marijuana, Cocaine    Comment: last used cocaine today    Social History   Social History  . Marital Status: Single    Spouse Name: N/A  . Number of Children: N/A  . Years of Education: N/A   Social History Main Topics  . Smoking status: Never Smoker   . Smokeless tobacco: Never Used  . Alcohol Use: 3.6 oz/week    1 Glasses of wine, 5 Cans of beer, 0 Shots of liquor, 0 Standard drinks or equivalent per week     Comment: binger on etoh and cocaine  . Drug Use: 1.00 per week    Special: Marijuana, Cocaine     Comment: last used cocaine today  . Sexual Activity: No   Other Topics Concern  . None   Social History Narrative   Additional History:    Sleep: "Poor"  Appetite:  Fair  Musculoskeletal: Strength & Muscle Tone: within normal limits Gait & Station: normal Patient leans: N/A  Psychiatric Specialty Exam: Physical Exam  Review of Systems  Constitutional: Positive for malaise/fatigue and diaphoresis.  HENT: Negative.   Eyes: Negative.   Respiratory: Negative.   Cardiovascular: Negative.   Gastrointestinal: Negative.   Genitourinary: Negative.   Musculoskeletal: Negative.   Skin: Negative.   Neurological: Positive for  weakness.  Endo/Heme/Allergies: Negative.   Psychiatric/Behavioral: Positive for depression and substance abuse (Alcohol/cocaine use disorder). Negative for suicidal ideas, hallucinations and memory loss. The patient is nervous/anxious and has insomnia.     Blood pressure 128/87, pulse 85, temperature 98.5 F (36.9 C), temperature source Oral, resp. rate 24, height  (1.727 m), weight 115.667 kg (255 lb).Body mass index is 38.78 kg/(m^2).  General Appearance: Casual  Eye Contact::  Good  Speech:  Normal Rate  Volume:  Normal  Mood:  Depressed and Hopeless  Affect:  Congruent and Flat   Thought Process:  Coherent and Intact  Orientation:  Full (Time, Place, and Person)  Thought Content:  Rumination and denies any hallucinations  Suicidal Thoughts:  No  Homicidal Thoughts:  No  Memory:  Fairly intact  Judgement:  Fair  Insight:  Present  Psychomotor Activity:  Tremor  Concentration:  Fair  Recall:  Fair  Fund of Knowledge:Fair  Language: Good  Akathisia:  No  Handed:  Right  AIMS (if indicated):     Assets:  Desire for Improvement  ADL's: Fair  Cognition: Fair  Sleep:  Number of Hours: 6.25   Current Medications: Current Facility-Administered Medications  Medication Dose Route Frequency Provider Last Rate Last Dose  . acetaminophen (TYLENOL) tablet 650 mg  650 mg Oral Q6H PRN Earney Navy, NP   650 mg at 04/13/15 2232  . alum & mag hydroxide-simeth (MAALOX/MYLANTA) 200-200-20 MG/5ML suspension 30 mL  30 mL Oral Q4H PRN Earney Navy, NP   30 mL at 04/13/15 0750  . chlordiazePOXIDE (LIBRIUM) capsule 25 mg  25 mg Oral Q6H PRN Adonis Brook, NP      . chlordiazePOXIDE (LIBRIUM) capsule 25 mg  25 mg Oral TID Adonis Brook, NP   25 mg at 04/14/15 1151   Followed by  . [START ON 04/15/2015] chlordiazePOXIDE (LIBRIUM) capsule 25 mg  25 mg Oral BH-qamhs Adonis Brook, NP       Followed by  . [START ON 04/16/2015] chlordiazePOXIDE (LIBRIUM) capsule 25 mg  25 mg Oral Daily Adonis Brook, NP      . feeding supplement (ENSURE ENLIVE) (ENSURE ENLIVE) liquid 237 mL  237 mL Oral BID BM Rachael Fee, MD   237 mL at 04/14/15 0816  . hydrOXYzine (ATARAX/VISTARIL) tablet 25 mg  25 mg Oral Q6H PRN Adonis Brook, NP      . loperamide (IMODIUM) capsule 2-4 mg  2-4 mg Oral PRN Adonis Brook, NP      . magnesium hydroxide (MILK OF MAGNESIA) suspension 30 mL  30 mL Oral Daily PRN Earney Navy, NP      . multivitamin with minerals tablet 1 tablet  1 tablet Oral Daily Adonis Brook, NP   1 tablet at 04/14/15 0816  . ondansetron (ZOFRAN-ODT) disintegrating tablet 4  mg  4 mg Oral Q6H PRN Adonis Brook, NP      . thiamine (VITAMIN B-1) tablet 100 mg  100 mg Oral Daily Adonis Brook, NP   100 mg at 04/14/15 4098   Lab Results: No results found for this or any previous visit (from the past 48 hour(s)).  Physical Findings: AIMS: Facial and Oral Movements Muscles of Facial Expression: None, normal Lips and Perioral Area: None, normal Jaw: None, normal Tongue: None, normal,Extremity Movements Upper (arms, wrists, hands, fingers): None, normal Lower (legs, knees, ankles, toes): None, normal, Trunk Movements Neck, shoulders, hips: None, normal, Overall Severity Severity of abnormal movements (highest score from questions above): None, normal  Incapacitation due to abnormal movements: None, normal Patient's awareness of abnormal movements (rate only patient's report): No Awareness, Dental Status Current problems with teeth and/or dentures?: No Does patient usually wear dentures?: No  CIWA:  CIWA-Ar Total: 1 COWS:     Treatment Plan Summary: Daily contact with patient to assess and evaluate symptoms and progress in treatment and Medication management: Supportive approach/coping skills/relapse prevention           Depression: Will continue to work with mindfulness, CBT help  identify the cognitive distortions that keep the depression going, evaluate the need for antidepressant therapy.          Discuss other life style changes that can help with both his depression and his alcohol/cocaine use such like exercise, meditation           Alcohol Dependence: will continue the detox protocols, will use motivational interviewing and encourage to pursue total abstinence           Continue to educate in terms of AA and other recovery strategies  Medical Decision Making:  Review of Psycho-Social Stressors (1), Review of Last Therapy Session (1), Review of Medication Regimen & Side Effects (2) and Review of New Medication or Change in Dosage (2)  Sanjuana Kava, PMHNP,  FNP-BC 04/14/2015, 2:38 PM Agree with NP Progress Note, as above  Nehemiah Massed, MD

## 2015-04-14 NOTE — [facility] Group Notes (Signed)
[facility] Group Notes:  (Clinical Social Work)  04/14/2015     10-11AM  Summary of Progress/Problems:   The main focus of today's process group was to learn how to use a decisional balance exercise to move forward in the Stages of Change, which were described and discussed.  Motivational Interviewing and the whiteboard were utilized to help patients explore in depth the perceived benefits and costs of unhealthy coping techniques, as well as the  benefits and costs of replacing that with a healthy coping skills.   The patient expressed that his unhealthy coping involved using alcohol and cocaine in order to deal with the grief and PTSD triggered by his mother's recent death.  He had been clean for 2 years prior to that, is feeling a lot of guilt for losing that.  He also acknowledged his anger at other family members who are using religion as a means of moving on and telling him to "just get over it."  Type of Therapy:  Group Therapy - Process   Participation Level:  Active  Participation Quality:  Attentive and Sharing  Affect:  Blunted and Depressed  Cognitive:  Alert, Appropriate and Oriented  Insight:  Engaged  Engagement in Therapy:  Engaged  Modes of Intervention:  Education, Motivational Interviewing  Ambrose Mantle, LCSW 04/14/2015, 12:47 PM

## 2015-04-14 NOTE — Progress Notes (Signed)
D: Pt continued to lay down during the assessment just as he did the prior day. Pt avoided looking at the writer throughout the assessment.  Pt voiced no questions or concerns.   A:  Support and encouragement was offered. 15 min checks continued for safety.  R: Pt remains safe.

## 2015-04-14 NOTE — [facility] Group Notes (Signed)
[facility] Group Notes:  (Nursing/MHT/Case Management/Adjunct)  Date:  04/14/2015  Time: 0900 am  Type of Therapy:  Psychoeducational Skills  Participation Level:  Did Not Attend  Patient invited; declined to attend.  [name] Mon 04/14/2015, 10:46 AM

## 2015-04-14 NOTE — Progress Notes (Signed)
Psychoeducational Group Note  Date:  04/14/2015 Time:  2045  Group Topic/Focus:  wrap up group  Participation Level: Did Not Attend  Participation Quality:  Not Applicable  Affect:  Not Applicable  Cognitive:  Not Applicable  Insight:  Not Applicable  Engagement in Group: Not Applicable  Additional Comments:  Pt was notified that group was beginning but remained in bed.   Shelah Lewandowsky 04/14/2015, 9:51 PM

## 2015-04-14 NOTE — Plan of Care (Signed)
Problem: Alteration in mood & ability to function due to Goal: STG-Patient will attend groups Outcome: Not Met (add Reason) Patient has not attended any groups.

## 2015-04-15 DIAGNOSIS — F419 Anxiety disorder, unspecified: Secondary | ICD-10-CM

## 2015-04-15 DIAGNOSIS — F1424 Cocaine dependence with cocaine-induced mood disorder: Secondary | ICD-10-CM | POA: Insufficient documentation

## 2015-04-15 MED ORDER — HYDROXYZINE HCL 25 MG PO TABS: 25.0000 mg | ORAL_TABLET | Freq: Once | ORAL | Status: DC

## 2015-04-15 MED ORDER — FLUOXETINE HCL 20 MG PO CAPS
20.0000 mg | ORAL_CAPSULE | Freq: Every day | ORAL | Status: DC
  Administered 2015-04-15: 20 mg via ORAL
  Filled 2015-04-15 (×3): qty 1

## 2015-04-15 MED ORDER — CHLORDIAZEPOXIDE HCL 25 MG PO CAPS: 25.0000 mg | ORAL_CAPSULE | Freq: Once | ORAL | Status: DC

## 2015-04-15 MED ORDER — HYDROXYZINE HCL 25 MG PO TABS
25.0000 mg | ORAL_TABLET | ORAL | Status: DC | PRN
  Administered 2015-04-16 – 2015-04-18 (×3): 25 mg via ORAL
  Filled 2015-04-15 (×4): qty 1

## 2015-04-15 NOTE — [facility] Group Notes (Signed)
[facility] Group Notes: (Clinical Social Work)   04/15/2015      Type of Therapy:  Group Therapy   Participation Level:  Did Not Attend despite MHT prompting   Ambrose Mantle, LCSW 04/15/2015, 12:21 PM

## 2015-04-15 NOTE — Progress Notes (Signed)
NSG 7a-7p shift:   D:  Pt. Had been somnolent for most of this morning/early afternoon, and when he woke up, he stated that he had difficulty sleeping the previous night due to nightmares.  He is extremely guarded but endorses passive SI without a plan.  He is contracting for safety but is very blunted in affect. He states that he is overwhelmed with "everything".   Pt did not elaborate even when encouraged and given opportunity.  A: Support, education, and encouragement provided as needed.  Level 3 checks continued for safety.  R: Pt. receptive to intervention/s.  Safety maintained.  Joaquin Music, RN

## 2015-04-15 NOTE — Progress Notes (Signed)
Patient did not attend the evening speaker AA meeting. Pt was notified that group was beginning but remained in bed.   

## 2015-04-15 NOTE — Progress Notes (Signed)
D: Pt continues to isolate to his room. Pt informed the writer that he "woke up with sweats". Stated his dreams "keep involving drugs, taking drugs". Pt voiced no other questions or concerns.  A: Pt was given hs meds which consisted of librium.  Support and encouragement was offered. 15 min checks continued for safety.  R: Pt remains safe.

## 2015-04-15 NOTE — Progress Notes (Signed)
Patient ID: [name], male   DOB: April 06, 1971, 44 y.o.   MRN: [id number] [facility] MD Progress Note  04/15/2015 5:10 PM [name]  MRN:  [id number]  Subjective: [name] says, "I feel terrible today. I have the shakes, sweats, bad depression, high anxiety, feeling hopeless. I slept poorly last night. I'm just not at my best today. My stomach is bothering me. I don't know what to do"   Objective: [name] is seen, chart reviewed. He is a 44 year old black male. He says he says he is still having a hard time today. He spent most of the day in his room. He appears sluggish. He denies any SIHI, AVH.  Principal Problem: Severe recurrent major depression without psychotic features  Diagnosis:   Patient Active Problem List   Diagnosis Date Noted  . Severe recurrent major depression without psychotic features [F33.2] 04/13/2015  . Alcohol use disorder, severe, dependence [F10.20] 04/12/2015  . Alcohol abuse [F10.10] 04/07/2013  . Cocaine dependence [F14.20] 04/07/2013  . Depressive disorder, not elsewhere classified [F32.9] 04/07/2013  . Anxiety [F41.9] 03/27/2013  . Palpitations [R00.2] 03/27/2013  . Hypertension [I10] 09/08/2011  . Post traumatic stress disorder (PTSD) [F43.10] 09/08/2011  . Polysubstance dependence cocaine & alcohol [F19.20] 08/16/2011   Total Time spent with patient: 15 minutes  Past Medical History:  Past Medical History  Diagnosis Date  . Anxiety   . Bipolar 1 disorder   . Asthma   . Hypertension   . Depression   . PTSD (post-traumatic stress disorder)   . Heart palpitations     with severe anxiety    Past Surgical History  Procedure Laterality Date  . No past surgeries     Family History: History reviewed. No pertinent family history.  Social History:  History  Alcohol Use  . 3.6 oz/week  . 1 Glasses of wine, 5 Cans of beer, 0 Shots of liquor, 0 Standard drinks or equivalent per week    Comment: binger on etoh and cocaine     History  Drug Use  . 1.00  per week  . Special: Marijuana, Cocaine    Comment: last used cocaine today    Social History   Social History  . Marital Status: Single    Spouse Name: N/A  . Number of Children: N/A  . Years of Education: N/A   Social History Main Topics  . Smoking status: Never Smoker   . Smokeless tobacco: Never Used  . Alcohol Use: 3.6 oz/week    1 Glasses of wine, 5 Cans of beer, 0 Shots of liquor, 0 Standard drinks or equivalent per week     Comment: binger on etoh and cocaine  . Drug Use: 1.00 per week    Special: Marijuana, Cocaine     Comment: last used cocaine today  . Sexual Activity: No   Other Topics Concern  . None   Social History Narrative   Additional History:    Sleep: "Poor"  Appetite:  Fair  Musculoskeletal: Strength & Muscle Tone: within normal limits Gait & Station: normal Patient leans: N/A  Psychiatric Specialty Exam: Physical Exam  Review of Systems  Constitutional: Positive for malaise/fatigue and diaphoresis.  HENT: Negative.   Eyes: Negative.   Respiratory: Negative.   Cardiovascular: Negative.   Gastrointestinal: Negative.   Genitourinary: Negative.   Musculoskeletal: Negative.   Skin: Negative.   Neurological: Positive for weakness.  Endo/Heme/Allergies: Negative.   Psychiatric/Behavioral: Positive for depression and substance abuse (Alcohol/cocaine use disorder). Negative for  suicidal ideas, hallucinations and memory loss. The patient is nervous/anxious and has insomnia.     Blood pressure 142/100, pulse 88, temperature 98 F (36.7 C), temperature source Oral, resp. rate 24, height 5\' 8"  (1.727 m), weight 115.667 kg (255 lb).Body mass index is 38.78 kg/(m^2).  General Appearance: Casual  Eye Contact::  Good  Speech:  Normal Rate  Volume:  Normal  Mood:  Depressed and Hopeless  Affect:  Congruent and Flat  Thought Process:  Coherent and Intact  Orientation:  Full (Time, Place, and Person)  Thought Content:  Rumination and denies any  hallucinations  Suicidal Thoughts:  No  Homicidal Thoughts:  No  Memory:  Fairly intact  Judgement:  Fair  Insight:  Present  Psychomotor Activity:  Tremor  Concentration:  Fair  Recall:  Fair  Fund of Knowledge:Fair  Language: Good  Akathisia:  No  Handed:  Right  AIMS (if indicated):     Assets:  Desire for Improvement  ADL's: Fair  Cognition: Fair  Sleep:  Number of Hours: 6.25   Current Medications: Current Facility-Administered Medications  Medication Dose Route Frequency Provider Last Rate Last Dose  . acetaminophen (TYLENOL) tablet 650 mg  650 mg Oral Q6H PRN Earney Navy, NP   650 mg at 04/13/15 2232  . alum & mag hydroxide-simeth (MAALOX/MYLANTA) 200-200-20 MG/5ML suspension 30 mL  30 mL Oral Q4H PRN Earney Navy, NP   30 mL at 04/13/15 0750  . chlordiazePOXIDE (LIBRIUM) capsule 25 mg  25 mg Oral BH-qamhs Adonis Brook, NP   25 mg at 04/15/15 1446   Followed by  . [START ON 04/16/2015] chlordiazePOXIDE (LIBRIUM) capsule 25 mg  25 mg Oral Daily Adonis Brook, NP      . feeding supplement (ENSURE ENLIVE) (ENSURE ENLIVE) liquid 237 mL  237 mL Oral BID BM Rachael Fee, MD   237 mL at 04/15/15 1446  . magnesium hydroxide (MILK OF MAGNESIA) suspension 30 mL  30 mL Oral Daily PRN Earney Navy, NP      . mirtazapine (REMERON) tablet 15 mg  15 mg Oral QHS Sanjuana Kava, NP   15 mg at 04/14/15 2159  . multivitamin with minerals tablet 1 tablet  1 tablet Oral Daily Adonis Brook, NP   1 tablet at 04/15/15 1445  . thiamine (VITAMIN B-1) tablet 100 mg  100 mg Oral Daily Adonis Brook, NP   100 mg at 04/15/15 1445   Lab Results: No results found for this or any previous visit (from the past 48 hour(s)).  Physical Findings: AIMS: Facial and Oral Movements Muscles of Facial Expression: None, normal Lips and Perioral Area: None, normal Jaw: None, normal Tongue: None, normal,Extremity Movements Upper (arms, wrists, hands, fingers): None, normal Lower (legs,  knees, ankles, toes): None, normal, Trunk Movements Neck, shoulders, hips: None, normal, Overall Severity Severity of abnormal movements (highest score from questions above): None, normal Incapacitation due to abnormal movements: None, normal Patient's awareness of abnormal movements (rate only patient's report): No Awareness, Dental Status Current problems with teeth and/or dentures?: No Does patient usually wear dentures?: No  CIWA:  CIWA-Ar Total: 0 COWS:     Treatment Plan Summary: Daily contact with patient to assess and evaluate symptoms and progress in treatment and Medication management: Supportive approach/coping skills/relapse prevention           Depression: Will continue to work with mindfulness, CBT help  identify the cognitive distortions that keep the depression going, initiate Prozac 20 mg.  Discuss other life style changes that can help with both his depression and his alcohol/cocaine use such like exercise, meditation           Alcohol Dependence: will continue the detox protocols, will use motivational interviewing and encourage to pursue total abstinence           Continue to educate in terms of AA and other recovery.          Anxiety: Start Hydroxyzine 25 mg qid prn .     Medical Decision Making:  Review of Psycho-Social Stressors (1), Review of Last Therapy Session (1), Review of Medication Regimen & Side Effects (2) and Review of New Medication or Change in Dosage (2)  Sanjuana Kava, PMHNP, FNP-BC 04/15/2015, 5:10 PM

## 2015-04-16 DIAGNOSIS — F1424 Cocaine dependence with cocaine-induced mood disorder: Secondary | ICD-10-CM

## 2015-04-16 MED ORDER — ACAMPROSATE CALCIUM 333 MG PO TBEC
666.0000 mg | DELAYED_RELEASE_TABLET | Freq: Three times a day (TID) | ORAL | Status: DC
  Administered 2015-04-16 – 2015-04-20 (×12): 666 mg via ORAL
  Filled 2015-04-16 (×5): qty 2
  Filled 2015-04-16: qty 84
  Filled 2015-04-16 (×6): qty 2
  Filled 2015-04-16: qty 84
  Filled 2015-04-16 (×4): qty 2
  Filled 2015-04-16: qty 84
  Filled 2015-04-16: qty 2

## 2015-04-16 MED ORDER — MIRTAZAPINE 30 MG PO TABS
30.0000 mg | ORAL_TABLET | Freq: Every day | ORAL | Status: DC
  Administered 2015-04-16 – 2015-04-17 (×2): 30 mg via ORAL
  Filled 2015-04-16 (×4): qty 1

## 2015-04-16 MED ORDER — CHLORDIAZEPOXIDE HCL 25 MG PO CAPS
25.0000 mg | ORAL_CAPSULE | Freq: Three times a day (TID) | ORAL | Status: DC | PRN
  Administered 2015-04-16 – 2015-04-19 (×7): 25 mg via ORAL
  Filled 2015-04-16 (×7): qty 1

## 2015-04-16 NOTE — Progress Notes (Signed)
D: [name] has complained of generally feeling poorly today. At the beginning of the shift, he complained of having some SOB. Vitals noted WNL. Pt has had no further complaints of such. He endorses withdrawal symptoms. He admits having thoughts of suicide without a plan. He contracts for safety.  A: Meds given as ordered, including PRNs for withdrawal and anxiety, with some relief with Librium but not Vistaril. Q15 safety checks maintained. Support/encouragement offered. R: Pt remains free from harm and continues with treatment. Will continue to monitor for needs/safety.

## 2015-04-16 NOTE — Progress Notes (Signed)
[facility] MD Progress Note  04/16/2015 5:47 PM [name]  MRN:  [id number] Subjective:  Kaden states he had some issues with perceptions. She felt it is Prozac what caused it. He would rather try without it. He still endorses some tremors and and some agitation.He feels he is still withdrawing given he used so much cocaine and alcohol before he came here Principal Problem: Severe recurrent major depression without psychotic features Diagnosis:   Patient Active Problem List   Diagnosis Date Noted  . Cocaine dependence with cocaine-induced mood disorder [F14.24]   . Severe recurrent major depression without psychotic features [F33.2] 04/13/2015  . Alcohol use disorder, severe, dependence [F10.20] 04/12/2015  . Alcohol abuse [F10.10] 04/07/2013  . Cocaine dependence [F14.20] 04/07/2013  . Depressive disorder, not elsewhere classified [F32.9] 04/07/2013  . Anxiety [F41.9] 03/27/2013  . Palpitations [R00.2] 03/27/2013  . Hypertension [I10] 09/08/2011  . Post traumatic stress disorder (PTSD) [F43.10] 09/08/2011  . Polysubstance dependence cocaine & alcohol [F19.20] 08/16/2011   Total Time spent with patient: 30 minutes   Past Medical History:  Past Medical History  Diagnosis Date  . Anxiety   . Bipolar 1 disorder   . Asthma   . Hypertension   . Depression   . PTSD (post-traumatic stress disorder)   . Heart palpitations     with severe anxiety    Past Surgical History  Procedure Laterality Date  . No past surgeries     Family History: History reviewed. No pertinent family history. Social History:  History  Alcohol Use  . 3.6 oz/week  . 1 Glasses of wine, 5 Cans of beer, 0 Shots of liquor, 0 Standard drinks or equivalent per week    Comment: binger on etoh and cocaine     History  Drug Use  . 1.00 per week  . Special: Marijuana, Cocaine    Comment: last used cocaine today    Social History   Social History  . Marital Status: Single    Spouse Name: N/A  . Number of  Children: N/A  . Years of Education: N/A   Social History Main Topics  . Smoking status: Never Smoker   . Smokeless tobacco: Never Used  . Alcohol Use: 3.6 oz/week    1 Glasses of wine, 5 Cans of beer, 0 Shots of liquor, 0 Standard drinks or equivalent per week     Comment: binger on etoh and cocaine  . Drug Use: 1.00 per week    Special: Marijuana, Cocaine     Comment: last used cocaine today  . Sexual Activity: No   Other Topics Concern  . None   Social History Narrative   Additional History:    Sleep: Fair  Appetite:  Fair   Assessment:   Musculoskeletal: Strength & Muscle Tone: within normal limits Gait & Station: normal Patient leans: normal   Psychiatric Specialty Exam: Physical Exam  Review of Systems  Constitutional: Positive for malaise/fatigue.  HENT: Negative.   Eyes: Negative.   Respiratory: Negative.   Cardiovascular: Negative.   Gastrointestinal: Negative.   Genitourinary: Negative.   Musculoskeletal: Negative.   Skin: Negative.   Neurological: Positive for weakness.  Endo/Heme/Allergies: Negative.   Psychiatric/Behavioral: Positive for depression and substance abuse. The patient is nervous/anxious.     Blood pressure 139/94, pulse 88, temperature 97.8 F (36.6 C), temperature source Oral, resp. rate 18, height  (1.727 m), weight 115.667 kg (255 lb).Body mass index is 38.78 kg/(m^2).  General Appearance: Fairly Groomed  Patent attorney::  Fair  Speech:  Clear and Coherent and Slow  Volume:  Decreased  Mood:  Anxious, Depressed and worried  Affect:  Restricted  Thought Process:  Coherent and Goal Directed  Orientation:  Full (Time, Place, and Person)  Thought Content:  symptoms events worries concerns  Suicidal Thoughts:  No  Homicidal Thoughts:  No  Memory:  Immediate;   Fair Recent;   Fair Remote;   Fair  Judgement:  Fair  Insight:  Present  Psychomotor Activity:  Decreased  Concentration:  Fair  Recall:  [employer] of  Knowledge:Fair  Language: Fair  Akathisia:  No  Handed:  Right  AIMS (if indicated):     Assets:  Desire for Improvement  ADL's:  Intact  Cognition: WNL  Sleep:  Number of Hours: 5.25     Current Medications: Current Facility-Administered Medications  Medication Dose Route Frequency Provider Last Rate Last Dose  . acamprosate (CAMPRAL) tablet 666 mg  666 mg Oral TID Rachael Fee, MD   666 mg at 04/16/15 1710  . acetaminophen (TYLENOL) tablet 650 mg  650 mg Oral Q6H PRN Earney Navy, NP   650 mg at 04/13/15 2232  . alum & mag hydroxide-simeth (MAALOX/MYLANTA) 200-200-20 MG/5ML suspension 30 mL  30 mL Oral Q4H PRN Earney Navy, NP   30 mL at 04/13/15 0750  . chlordiazePOXIDE (LIBRIUM) capsule 25 mg  25 mg Oral Once Sanjuana Kava, NP      . chlordiazePOXIDE (LIBRIUM) capsule 25 mg  25 mg Oral TID PRN Rachael Fee, MD   25 mg at 04/16/15 1715  . feeding supplement (ENSURE ENLIVE) (ENSURE ENLIVE) liquid 237 mL  237 mL Oral BID BM Rachael Fee, MD   237 mL at 04/16/15 1710  . hydrOXYzine (ATARAX/VISTARIL) tablet 25 mg  25 mg Oral Q4H PRN Sanjuana Kava, NP   25 mg at 04/16/15 1218  . hydrOXYzine (ATARAX/VISTARIL) tablet 25 mg  25 mg Oral Once Sanjuana Kava, NP      . magnesium hydroxide (MILK OF MAGNESIA) suspension 30 mL  30 mL Oral Daily PRN Earney Navy, NP      . mirtazapine (REMERON) tablet 30 mg  30 mg Oral QHS Rachael Fee, MD      . multivitamin with minerals tablet 1 tablet  1 tablet Oral Daily Adonis Brook, NP   1 tablet at 04/16/15 0810  . thiamine (VITAMIN B-1) tablet 100 mg  100 mg Oral Daily Adonis Brook, NP   100 mg at 04/16/15 1610    Lab Results: No results found for this or any previous visit (from the past 48 hour(s)).  Physical Findings: AIMS: Facial and Oral Movements Muscles of Facial Expression: None, normal Lips and Perioral Area: None, normal Jaw: None, normal Tongue: None, normal,Extremity Movements Upper (arms, wrists, hands,  fingers): None, normal Lower (legs, knees, ankles, toes): None, normal, Trunk Movements Neck, shoulders, hips: None, normal, Overall Severity Severity of abnormal movements (highest score from questions above): None, normal Incapacitation due to abnormal movements: None, normal Patient's awareness of abnormal movements (rate only patient's report): No Awareness, Dental Status Current problems with teeth and/or dentures?: No Does patient usually wear dentures?: No  CIWA:  CIWA-Ar Total: 5 COWS:     Treatment Plan Summary: Daily contact with patient to assess and evaluate symptoms and progress in treatment and Medication management Supportive approach/coping skills Alcohol cocaine dependence; will extend the Librium detox protocol will continue to work a relapse  prevention plan Depression; will D/C the Prozac as Ladonte feels it is causing the perceptual experiences Will reassess for another antidepressant; will go ahead and increase the Remeron to 30 mg and use it as the antidepressant Explore residential treatment options Will continue to monitor the BP  Medical Decision Making:  Review of Psycho-Social Stressors (1), Review of Medication Regimen & Side Effects (2) and Review of New Medication or Change in Dosage (2)     Tarius Stangelo A 04/16/2015, 5:47 PM

## 2015-04-16 NOTE — Progress Notes (Signed)
Patient ID: [name], male   DOB: Apr 19, 1971, 44 y.o.   MRN: [id number] PER STATE REGULATIONS 482.30  THIS CHART WAS REVIEWED FOR MEDICAL NECESSITY WITH RESPECT TO THE PATIENT'S ADMISSION/ DURATION OF STAY.  NEXT REVIEW DATE:   04/20/2015  Willa Rough, RN, BSN CASE MANAGER

## 2015-04-16 NOTE — [facility] Group Notes (Signed)
[facility] LCSW Group Therapy  04/16/2015 1:14 PM  Type of Therapy:  Group Therapy  Participation Level:  Did Not Attend-Invited. Chose to remain in room to sleep. Pt reports that he is still not feeling well.   Summary of Progress/Problems: Today's Topic: Overcoming Obstacles. Patients identified one short term goal and potential obstacles in reaching this goal. Patients processed barriers involved in overcoming these obstacles. Patients identified steps necessary for overcoming these obstacles and explored motivation (internal and external) for facing these difficulties head on.   Smart, Heather LCSWA  04/16/2015, 1:14 PM

## 2015-04-16 NOTE — Progress Notes (Signed)
Recreation Therapy Notes  Date: 08.15.2016 Time: 9:30am  Location: [address] Group Room   Group Topic: Stress Management  Goal Area(s) Addresses:  Patient will actively participate in stress management techniques presented during session.   Behavioral Response: Did not attend.   See Beharry L Sarya Linenberger, LRT/CTRS        Harutyun Monteverde L 04/16/2015 3:06 PM 

## 2015-04-16 NOTE — [facility] Group Notes (Signed)
[facility] LCSW Aftercare Discharge Planning Group Note   04/16/2015 10:53 AM  Participation Quality:  Appropriate   Mood/Affect:  Depressed and Irritable  Depression Rating:  9  Anxiety Rating:  9  Thoughts of Suicide:  Yes Will you contract for safety?   Yes  Current AVH:  No  Plan for Discharge/Comments:  Pt reports that he continues to experience high dep/anx and passive SI. Pt reports that he is trying to get different payee due to his current payee misappropriating funds. CSW assessing. Pt wants to live in sober living environment. [employer] operational officer given. Pt reporting some withdrawal sx.   Transportation Means:  Unknown at this time.   Supports: none identified by pt.   Smart, [employer]

## 2015-04-16 NOTE — [facility] Group Notes (Signed)
Adult Psychoeducational Group Note  Date:  04/16/2015 Time:  9:49 PM  Group Topic/Focus:  AA Meeting  Participation Level:  Did Not Attend  Participation Quality:  None  Affect:  None  Cognitive:  None  Insight: None  Engagement in Group:  None  Modes of Intervention:  Discussion  Additional Comments:  Patient did not attend group.  Caroll Rancher A 04/16/2015, 9:49 PM

## 2015-04-17 NOTE — Progress Notes (Signed)
D: Patient presents with depressed mood. He denies SI/HI. Pt does c/o withdrawal symptoms including tremors, auditory hallucinations, sweats. Pt isolating to room. Denies physical pain. Guarded during conversation. Forwards little.  A:Special checks q 15 mins in place for safety. Alcohol withdrawal protocol in place. Medication administered per MD order (see eMAR) Encourgement and support provided.  R:Complaint with medication. Safety maintained. Will continue to monitor.

## 2015-04-17 NOTE — Progress Notes (Signed)
[facility] MD Progress Note  04/17/2015 6:21 PM [name]  MRN:  [id number] Subjective:  [name] continues to endorse that he is having a hard time. States he did not sleep that well as his roommate left early this AM to go to [facility]. He states he started to get ready at midnight. He states he spoke with his brother and that this went well. He had endorsed SI earlier to the nurse but denies now. Would still like to go to a half way house once he is stable  Principal Problem: Severe recurrent major depression without psychotic features Diagnosis:   Patient Active Problem List   Diagnosis Date Noted  . Cocaine dependence with cocaine-induced mood disorder [F14.24]   . Severe recurrent major depression without psychotic features [F33.2] 04/13/2015  . Alcohol use disorder, severe, dependence [F10.20] 04/12/2015  . Alcohol abuse [F10.10] 04/07/2013  . Cocaine dependence [F14.20] 04/07/2013  . Depressive disorder, not elsewhere classified [F32.9] 04/07/2013  . Anxiety [F41.9] 03/27/2013  . Palpitations [R00.2] 03/27/2013  . Hypertension [I10] 09/08/2011  . Post traumatic stress disorder (PTSD) [F43.10] 09/08/2011  . Polysubstance dependence cocaine & alcohol [F19.20] 08/16/2011   Total Time spent with patient: 30 minutes   Past Medical History:  Past Medical History  Diagnosis Date  . Anxiety   . Bipolar 1 disorder   . Asthma   . Hypertension   . Depression   . PTSD (post-traumatic stress disorder)   . Heart palpitations     with severe anxiety    Past Surgical History  Procedure Laterality Date  . No past surgeries     Family History: History reviewed. No pertinent family history. Social History:  History  Alcohol Use  . 3.6 oz/week  . 1 Glasses of wine, 5 Cans of beer, 0 Shots of liquor, 0 Standard drinks or equivalent per week    Comment: binger on etoh and cocaine     History  Drug Use  . 1.00 per week  . Special: Marijuana, Cocaine    Comment: last used cocaine today     Social History   Social History  . Marital Status: Single    Spouse Name: N/A  . Number of Children: N/A  . Years of Education: N/A   Social History Main Topics  . Smoking status: Never Smoker   . Smokeless tobacco: Never Used  . Alcohol Use: 3.6 oz/week    1 Glasses of wine, 5 Cans of beer, 0 Shots of liquor, 0 Standard drinks or equivalent per week     Comment: binger on etoh and cocaine  . Drug Use: 1.00 per week    Special: Marijuana, Cocaine     Comment: last used cocaine today  . Sexual Activity: No   Other Topics Concern  . None   Social History Narrative   Additional History:    Sleep: Poor  Appetite:  Fair   Assessment:   Musculoskeletal: Strength & Muscle Tone: within normal limits Gait & Station: normal Patient leans: normal   Psychiatric Specialty Exam: Physical Exam  Review of Systems  Constitutional: Positive for malaise/fatigue.  HENT: Negative.   Eyes: Negative.   Respiratory: Negative.   Cardiovascular: Negative.   Gastrointestinal: Negative.   Genitourinary: Negative.   Musculoskeletal: Negative.   Skin: Negative.   Neurological: Positive for weakness.  Endo/Heme/Allergies: Negative.   Psychiatric/Behavioral: Positive for depression and substance abuse. The patient is nervous/anxious and has insomnia.     Blood pressure 145/92, pulse 89, temperature 98.4 F (  36.9 C), temperature source Oral, resp. rate 18, height  (1.727 m), weight 115.667 kg (255 lb).Body mass index is 38.78 kg/(m^2).  General Appearance: Disheveled  Eye Contact::  Minimal  Speech:  Clear and Coherent and Slow  Volume:  Decreased  Mood:  Anxious, Depressed and worried  Affect:  Restricted  Thought Process:  Coherent and Goal Directed  Orientation:  Full (Time, Place, and Person)  Thought Content:  symptoms events worries concerns  Suicidal Thoughts:  "not right now"  Homicidal Thoughts:  No  Memory:  Immediate;   Fair Recent;   Fair Remote;   Fair   Judgement:  Fair  Insight:  Present and Shallow  Psychomotor Activity:  Decreased  Concentration:  Fair  Recall:  [employer] of Knowledge:Fair  Language: Fair  Akathisia:  No  Handed:  Right  AIMS (if indicated):     Assets:  Desire for Improvement  ADL's:  Intact  Cognition: WNL  Sleep:  Number of Hours: 6.75     Current Medications: Current Facility-Administered Medications  Medication Dose Route Frequency Provider Last Rate Last Dose  . acamprosate (CAMPRAL) tablet 666 mg  666 mg Oral TID Rachael Fee, MD   666 mg at 04/17/15 1719  . acetaminophen (TYLENOL) tablet 650 mg  650 mg Oral Q6H PRN Earney Navy, NP   650 mg at 04/13/15 2232  . alum & mag hydroxide-simeth (MAALOX/MYLANTA) 200-200-20 MG/5ML suspension 30 mL  30 mL Oral Q4H PRN Earney Navy, NP   30 mL at 04/13/15 0750  . chlordiazePOXIDE (LIBRIUM) capsule 25 mg  25 mg Oral Once Sanjuana Kava, NP      . chlordiazePOXIDE (LIBRIUM) capsule 25 mg  25 mg Oral TID PRN Rachael Fee, MD   25 mg at 04/17/15 1721  . feeding supplement (ENSURE ENLIVE) (ENSURE ENLIVE) liquid 237 mL  237 mL Oral BID BM Rachael Fee, MD   237 mL at 04/17/15 1459  . hydrOXYzine (ATARAX/VISTARIL) tablet 25 mg  25 mg Oral Q4H PRN Sanjuana Kava, NP   25 mg at 04/16/15 1218  . hydrOXYzine (ATARAX/VISTARIL) tablet 25 mg  25 mg Oral Once Sanjuana Kava, NP      . magnesium hydroxide (MILK OF MAGNESIA) suspension 30 mL  30 mL Oral Daily PRN Earney Navy, NP      . mirtazapine (REMERON) tablet 30 mg  30 mg Oral QHS Rachael Fee, MD   30 mg at 04/16/15 2218  . multivitamin with minerals tablet 1 tablet  1 tablet Oral Daily Adonis Brook, NP   1 tablet at 04/17/15 [phone]  . thiamine (VITAMIN B-1) tablet 100 mg  100 mg Oral Daily Adonis Brook, NP   100 mg at 04/17/15 9604    Lab Results: No results found for this or any previous visit (from the past 48 hour(s)).  Physical Findings: AIMS: Facial and Oral Movements Muscles of Facial  Expression: None, normal Lips and Perioral Area: None, normal Jaw: None, normal Tongue: None, normal,Extremity Movements Upper (arms, wrists, hands, fingers): None, normal Lower (legs, knees, ankles, toes): None, normal, Trunk Movements Neck, shoulders, hips: None, normal, Overall Severity Severity of abnormal movements (highest score from questions above): None, normal Incapacitation due to abnormal movements: None, normal Patient's awareness of abnormal movements (rate only patient's report): No Awareness, Dental Status Current problems with teeth and/or dentures?: No Does patient usually wear dentures?: No  CIWA:  CIWA-Ar Total: 9 COWS:  Treatment Plan Summary: Daily contact with patient to assess and evaluate symptoms and progress in treatment and Medication management Supportive approach/coping skills Alcohol cocaine dependence; will continue the Librium detox protocol Cravings; continue the Campral 333 mg two TID Work a relapse prevention plan Depression/insmnia; continue the Remeron 30 mg Work with CBT/mindfulness Medical Decision Making:  Review of Psycho-Social Stressors (1) and Review of New Medication or Change in Dosage (2)     Denorris Reust A 04/17/2015, 6:21 PM

## 2015-04-17 NOTE — [facility] Group Notes (Signed)
[facility] LCSW Group Therapy  04/17/2015 11:41 AM  Type of Therapy:  Group Therapy  Participation Level:  Did Not Attend-pt chose to remain in bed during today's group.  Summary of Progress/Problems: MHA Speaker came to talk about his personal journey with substance abuse and addiction. The pt processed ways by which to relate to the speaker. MHA speaker provided handouts and educational information pertaining to groups and services offered by the [facility].   Smart, Bobbi Yount  LCSWA   04/17/2015, 11:41 AM

## 2015-04-17 NOTE — [facility] Suicide Risk Assessment (Signed)
[facility] INPATIENT:  Family/Significant Other Suicide Prevention Education  Suicide Prevention Education:  Patient Refusal for Family/Significant Other Suicide Prevention Education: The patient [name] has refused to provide written consent for family/significant other to be provided Family/Significant Other Suicide Prevention Education during admission and/or prior to discharge.  Physician notified.  SPE completed with pt, as pt refused to consent to family contact. SPI pamphlet provided to pt and pt was encouraged to share information with support network, ask questions, and talk about any concerns relating to SPE. Pt denies access to guns/firearms and verbalized understanding of information provided. Mobile Crisis information also provided to pt.   Smart, Quinlynn Cuthbert LCSWA  04/17/2015, 3:52 PM

## 2015-04-17 NOTE — Progress Notes (Signed)
Recreation Therapy Notes  Animal-Assisted Activity (AAA) Program Checklist/Progress Notes Patient Eligibility Criteria Checklist & Daily Group note for Rec Tx Intervention  Date: 08.16.2016 Time: 2:45pm Location: [address]    AAA/T Program Assumption of Risk Form signed by Patient/ or Parent Legal Guardian yes  Patient is free of allergies or sever asthma yes  Patient reports no fear of animals yes  Patient reports no history of cruelty to animals yes  Patient understands his/her participation is voluntary yes  Behavioral Response: Did not attend.    Marykay Lex Peytan Andringa, LRT/CTRS        Jilliam Bellmore L 04/17/2015 3:17 PM

## 2015-04-17 NOTE — [facility] Group Notes (Signed)
[facility] Group Notes:  (Nursing/MHT/Case Management/Adjunct)  Date:  04/17/2015  Time: 0900 Type of Therapy:  Nurse Education  Participation Level:  Did Not Attend   Summary of Progress/Problems:  Dara Hoyer 04/17/2015, 9:39 AM

## 2015-04-17 NOTE — Progress Notes (Signed)
[facility] Group Notes:  (Nursing/MHT/Case Management/Adjunct)  Date:  04/17/2015  Time:  2100  Type of Therapy:  wrap up group  Participation Level:  Active  Participation Quality:  Appropriate, Attentive, Sharing and Supportive  Affect:  Flat  Cognitive:  Appropriate  Insight:  Lacking  Engagement in Group:  Engaged  Modes of Intervention:  Clarification, Education and Support  Summary of Progress/Problems:Pt reports having two years clean before his mother passed. Pt reports it broke him. Pt reports not having any supports he can relate to. Pt wants to rekindle his relationship with Jesus, maybe find a church community, and maybe a grief group or AA supports. Pt shared that he enjoyed being around his peer patients today and enjoyed laughing.   Shelah Lewandowsky 04/17/2015, 9:59 PM

## 2015-04-17 NOTE — Progress Notes (Signed)
D: Pt was sitting on the bench in his room with the lights off, prior to assessment. Pt continues with flat affect. When asked why he didn't attend group, pt statedhe wasn't feeling well.   A:  Support and encouragement was offered. 15 min checks continued for safety.  R: Pt remains safe.

## 2015-04-18 MED ORDER — MIRTAZAPINE 15 MG PO TABS
45.0000 mg | ORAL_TABLET | Freq: Every day | ORAL | Status: DC
  Administered 2015-04-18 – 2015-04-19 (×2): 45 mg via ORAL
  Filled 2015-04-18 (×3): qty 3
  Filled 2015-04-18: qty 42

## 2015-04-18 MED ORDER — HYDROXYZINE HCL 50 MG PO TABS
50.0000 mg | ORAL_TABLET | ORAL | Status: DC | PRN
  Administered 2015-04-18 – 2015-04-19 (×3): 50 mg via ORAL
  Filled 2015-04-18 (×3): qty 1

## 2015-04-18 NOTE — Progress Notes (Signed)
D:Pt conversation minimal. Isolating self in room. Denies SI/HI. Denies AVH. He reports he did not sleep well last night. No attending groups on the unit.   A:Special checks q 15 mins in place for safety. Medication administered per MD order (see eMAR) Pt encouraged to get OOB. Emotional support provided.    R:Safety maintained. Compliant with medication regimen.Will continue to monitor.

## 2015-04-18 NOTE — Progress Notes (Signed)
Recreation Therapy Notes  Date: 08.17.2016 Time: 9:30am Location: [address] Group room   Group Topic: Stress Management  Goal Area(s) Addresses:  Patient will actively participate in stress management techniques presented during session.   Behavioral Response: Did not attend.   Marykay Lex Reily Treloar, LRT/CTRS        Anela Bensman L 04/18/2015 3:05 PM

## 2015-04-18 NOTE — Plan of Care (Signed)
Problem: Diagnosis: Increased Risk For Suicide Attempt Goal: LTG-Patient Will Report Improved Mood and Deny Suicidal LTG (by discharge) Patient will report improved mood and deny suicidal ideation.  Outcome: Progressing Pt continues to be passive suicidal thoughts, even though he deny's with the Clinical research associate. Goal: LTG-Patient Will Report Absence of Withdrawal Symptoms LTG (by discharge): Patient will report absence of withdrawal symptoms.  Outcome: Progressing Pt informed the writer that "he hopes he gets over the withdrawals". Goal: STG-Patient Will Attend All Groups On The Unit Outcome: Progressing Pt has attended more groups this week than previous week

## 2015-04-18 NOTE — Progress Notes (Signed)
D:Patient in his room in bed on approach.  Patient states he had a bad day.  When asked about his bad day patient talked about his family.  Patient states, "I'm sorry to say this but my family id full of Shit."  When asked to elaborate patient states,"They call themselves Christian people but they never do what they say they are going to do.  They just left me here by myself and they did not come to visit tonight."  Patient states, "I feel worthless."  Patient states he does have a plan to possibly go to an [place] house when discharged.  Patient states he hopes that this opportunity comes through for him.  Patient denies SI/HI and denies AVH.    A: Staff to monitor Q 15 mins for safety.  Encouragement and support offered.  Scheduled medications administered per orders.  Vistaril administered prn for sleep and anxiety. R: Patient remains safe on the unit.  Patient did not attend group tonight.  Patient visible on the unit and interacting with peers.  Patient taking administered medications.

## 2015-04-18 NOTE — B[facility] Group Notes (Signed)
[facility] LCSW Aftercare Discharge Planning Group Note   04/18/2015 11:19 AM  Participation Quality:  Appropriate   Mood/Affect:  Depressed, Flat and Irritable  Depression Rating:  8  Anxiety Rating:  10  Thoughts of Suicide:  No Will you contract for safety?   NA  Current AVH:  No  Plan for Discharge/Comments:  Pt continues to report high dep/anxiety/irritability. "I"m also having a problem sleeping and want to talk to the doctor about my meds." Pt reports good conversation with his siblings last night. [facility] last week and is hoping to [facility]. CSW assessing for mental health followup options. [facility] dropped pt last month.   Transportation Means: bus  Supports: siblings offer limited support   Smart, Oncologist

## 2015-04-18 NOTE — Progress Notes (Signed)
[facility] MD Progress Note  04/18/2015 6:37 PM [name]  MRN:  [id number] Subjective:  [name] endorses that he continues to have a hard time. States when he came in everything was "a ten" now it is " a 7" He still does not know what to do from here. He has communicated with his brother and sister. States they were cordial but not too inviting. He states he wants to be sure he does not go back to drinking or using cocaine.  Principal Problem: Severe recurrent major depression without psychotic features Diagnosis:   Patient Active Problem List   Diagnosis Date Noted  . Cocaine dependence with cocaine-induced mood disorder [F14.24]   . Severe recurrent major depression without psychotic features [F33.2] 04/13/2015  . Alcohol use disorder, severe, dependence [F10.20] 04/12/2015  . Alcohol abuse [F10.10] 04/07/2013  . Cocaine dependence [F14.20] 04/07/2013  . Depressive disorder, not elsewhere classified [F32.9] 04/07/2013  . Anxiety [F41.9] 03/27/2013  . Palpitations [R00.2] 03/27/2013  . Hypertension [I10] 09/08/2011  . Post traumatic stress disorder (PTSD) [F43.10] 09/08/2011  . Polysubstance dependence cocaine & alcohol [F19.20] 08/16/2011   Total Time spent with patient: 30 minutes   Past Medical History:  Past Medical History  Diagnosis Date  . Anxiety   . Bipolar 1 disorder   . Asthma   . Hypertension   . Depression   . PTSD (post-traumatic stress disorder)   . Heart palpitations     with severe anxiety    Past Surgical History  Procedure Laterality Date  . No past surgeries     Family History: History reviewed. No pertinent family history. Social History:  History  Alcohol Use  . 3.6 oz/week  . 1 Glasses of wine, 5 Cans of beer, 0 Shots of liquor, 0 Standard drinks or equivalent per week    Comment: binger on etoh and cocaine     History  Drug Use  . 1.00 per week  . Special: Marijuana, Cocaine    Comment: last used cocaine today    Social History   Social  History  . Marital Status: Single    Spouse Name: N/A  . Number of Children: N/A  . Years of Education: N/A   Social History Main Topics  . Smoking status: Never Smoker   . Smokeless tobacco: Never Used  . Alcohol Use: 3.6 oz/week    1 Glasses of wine, 5 Cans of beer, 0 Shots of liquor, 0 Standard drinks or equivalent per week     Comment: binger on etoh and cocaine  . Drug Use: 1.00 per week    Special: Marijuana, Cocaine     Comment: last used cocaine today  . Sexual Activity: No   Other Topics Concern  . None   Social History Narrative   Additional History:    Sleep: Fair  Appetite:  Fair   Assessment:   Musculoskeletal: Strength & Muscle Tone: within normal limits Gait & Station: normal Patient leans: normal   Psychiatric Specialty Exam: Physical Exam  Review of Systems  Constitutional: Negative.   HENT: Negative.   Eyes: Negative.   Respiratory: Negative.   Cardiovascular: Negative.   Gastrointestinal: Negative.   Genitourinary: Negative.   Musculoskeletal: Negative.   Skin: Negative.   Endo/Heme/Allergies: Negative.   Psychiatric/Behavioral: Positive for depression and substance abuse. The patient is nervous/anxious.     Blood pressure 158/96, pulse 71, temperature 97.9 F (36.6 C), temperature source Oral, resp. rate 19, height 5\' 8"  (1.727 m), weight 115.667 kg (  255 lb).Body mass index is 38.78 kg/(m^2).  General Appearance: Fairly Groomed  Patent attorney::  Fair  Speech:  Clear and Coherent and Slow  Volume:  Decreased  Mood:  Anxious and Depressed  Affect:  Restricted  Thought Process:  Coherent and Goal Directed  Orientation:  Full (Time, Place, and Person)  Thought Content:  symptoms events worries concerns  Suicidal Thoughts:  not today  Homicidal Thoughts:  No  Memory:  Immediate;   Fair Recent;   Fair Remote;   Fair  Judgement:  Fair  Insight:  Present and Shallow  Psychomotor Activity:  Restlessness  Concentration:  Fair  Recall:   [employer] of Knowledge:Fair  Language: Fair  Akathisia:  No  Handed:  Right  AIMS (if indicated):     Assets:  Desire for Improvement  ADL's:  Intact  Cognition: WNL  Sleep:  Number of Hours: 6.75     Current Medications: Current Facility-Administered Medications  Medication Dose Route Frequency Provider Last Rate Last Dose  . acamprosate (CAMPRAL) tablet 666 mg  666 mg Oral TID Rachael Fee, MD   666 mg at 04/18/15 1616  . acetaminophen (TYLENOL) tablet 650 mg  650 mg Oral Q6H PRN Earney Navy, NP   650 mg at 04/13/15 2232  . alum & mag hydroxide-simeth (MAALOX/MYLANTA) 200-200-20 MG/5ML suspension 30 mL  30 mL Oral Q4H PRN Earney Navy, NP   30 mL at 04/13/15 0750  . chlordiazePOXIDE (LIBRIUM) capsule 25 mg  25 mg Oral Once Sanjuana Kava, NP      . chlordiazePOXIDE (LIBRIUM) capsule 25 mg  25 mg Oral TID PRN Rachael Fee, MD   25 mg at 04/17/15 1721  . feeding supplement (ENSURE ENLIVE) (ENSURE ENLIVE) liquid 237 mL  237 mL Oral BID BM Rachael Fee, MD   237 mL at 04/18/15 1616  . hydrOXYzine (ATARAX/VISTARIL) tablet 25 mg  25 mg Oral Once Sanjuana Kava, NP      . hydrOXYzine (ATARAX/VISTARIL) tablet 50 mg  50 mg Oral Q4H PRN Rachael Fee, MD      . magnesium hydroxide (MILK OF MAGNESIA) suspension 30 mL  30 mL Oral Daily PRN Earney Navy, NP      . mirtazapine (REMERON) tablet 45 mg  45 mg Oral QHS Rachael Fee, MD      . multivitamin with minerals tablet 1 tablet  1 tablet Oral Daily Adonis Brook, NP   1 tablet at 04/18/15 0753  . thiamine (VITAMIN B-1) tablet 100 mg  100 mg Oral Daily Adonis Brook, NP   100 mg at 04/18/15 1610    Lab Results: No results found for this or any previous visit (from the past 48 hour(s)).  Physical Findings: AIMS: Facial and Oral Movements Muscles of Facial Expression: None, normal Lips and Perioral Area: None, normal Jaw: None, normal Tongue: None, normal,Extremity Movements Upper (arms, wrists, hands, fingers):  None, normal Lower (legs, knees, ankles, toes): None, normal, Trunk Movements Neck, shoulders, hips: None, normal, Overall Severity Severity of abnormal movements (highest score from questions above): None, normal Incapacitation due to abnormal movements: None, normal Patient's awareness of abnormal movements (rate only patient's report): No Awareness, Dental Status Current problems with teeth and/or dentures?: No Does patient usually wear dentures?: No  CIWA:  CIWA-Ar Total: 2 COWS:     Treatment Plan Summary: Daily contact with patient to assess and evaluate symptoms and progress in treatment and Medication management Supportive approach/coping skills  Alcohol abuse; continue the Librium detox protocol/work a relapse prevention plan Depression; will increase the Remeron to 45 mg HS Cravings; will continue the Campral 333 mg two TID Anxiety; will increase the Vistaril to 50 mg  CBT/mindfulness  Medical Decision Making:  Review of Psycho-Social Stressors (1), Review of Medication Regimen & Side Effects (2) and Review of New Medication or Change in Dosage (2)     Diron Haddon A 04/18/2015, 6:37 PM

## 2015-04-18 NOTE — [facility] Group Notes (Signed)
[facility] LCSW Group Therapy  04/18/2015 1:21 PM  Type of Therapy:  Group Therapy  Participation Level:  Active  Participation Quality:  Attentive  Affect:  Depressed and Flat  Cognitive:  Alert  Insight:  Improving  Engagement in Therapy:  Improving  Modes of Intervention:  Confrontation, Discussion, Education, Exploration, Problem-solving, Rapport Building, Socialization and Support  Summary of Progress/Problems: Emotion Regulation: This group focused on both positive and negative emotion identification and allowed group members to process ways to identify feelings, regulate negative emotions, and find healthy ways to manage internal/external emotions. Group members were asked to reflect on a time when their reaction to an emotion led to a negative outcome and explored how alternative responses using emotion regulation would have benefited them. Group members were also asked to discuss a time when emotion regulation was utilized when a negative emotion was experienced. [name] was attentive and engaged during today's processing group. He shared that he suffers from depression and SI, which is difficult to control "So I abuse drugs and alcohol to cope." [name] shared that he was let go from [facility] and felt that he was treated "very poorly." [name] states that he struggles with trusting people and wants new payee and new ACT team.   Smart, [name] LCSWA  04/18/2015, 1:21 PM

## 2015-04-18 NOTE — Progress Notes (Signed)
Pt did not attend NA group this evening.  

## 2015-04-18 NOTE — Tx Team (Signed)
Interdisciplinary Treatment Plan Update (Adult)  Date:  04/18/2015  Time Reviewed:  12:54 PM   Progress in Treatment: Attending groups: Yes. Participating in groups:  Yes. Taking medication as prescribed:  Yes. Tolerating medication:  Yes. Family/Significant othe contact made:  SPE completed with pt, as he refused to consent to family contact.   Patient understands diagnosis:  Yes. and As evidenced by:  seeking treatment for SI, depression, PTSD symptoms, ETOH/cocaine abuse. Discussing patient identified problems/goals with staff:  Yes. Medical problems stabilized or resolved:  Yes. Denies suicidal/homicidal ideation: Yes. Issues/concerns per patient self-inventory:  Other:  Discharge Plan or Barriers:   Reason for Continuation of Hospitalization: Depression Medication stabilization Withdrawal symptoms  Comments:  [name] is an 44 y.o. male presenting to [facility] with increased depression and suicidal thoughts. Patient reports feelings of hopelessness, fatigue, isolating self from others, and crying spells. Sts that he is having a hard time getting past the death of his mother. His mother passed away Nov 30, 2014. Sts that he found his mothers deceased body which also brought up issues related to his PTSD. Patient reports suicidal thoughts ongoing for the past 2 months. He has no desire to live. Denies a suicidal plan but is unable to contract for safety. No access to firearms or means. He denies HI and AVH's. He also reports self medicating self with alcohol and cocaine. 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 Onset was age 19. Patient also has a history of black outs. Patient hospitalized at [facility] multiple times. He was also hospitalized at [facility]. He does not have a current psychiatrist or therapist. Sts he was receiving ACT services from [facility] months ago but he was discharged. Patient sts he doesn't know why he was discharged. Major Depressive Disorder, Recurrent,  Severe, without psychotic features and Substance Abuse  Estimated length of stay:  3-5 days   Additional Comments:  Patient and CSW reviewed pt's identified goals and treatment plan. Patient verbalized understanding and agreed to treatment plan. CSW reviewed [facility] "Discharge Process and Patient Involvement" Form. Pt verbalized understanding of information provided and signed form.    Review of initial/current patient goals per problem list:  1. Goal(s): Patient will participate in aftercare plan  Met: No.   Target date: at discharge  As evidenced by: Patient will participate within aftercare plan AEB aftercare provider and housing plan at discharge being identified.  8/12: Pt expressed interest in moving into sober living house. CSW assessing for appropriate referrals.   8/17: Pt continues to isolate in room often. CSW working with pt to establish aftercare plan, being that he was recently dropped [facility].   2. Goal (s): Patient will exhibit decreased depressive symptoms and suicidal ideations.  Met: No.    Target date: at discharge  As evidenced by: Patient will utilize self rating of depression at 3 or below and demonstrate decreased signs of depression or be deemed stable for discharge by MD.  8/12: Pt rates depression as 8/10 and presents with depressed mood/flat affect. No SI/HI/AVH reported this morning.   8/17: PT rates depression as 8/10 and continues to present with depressed mood/irritable affect.   3. Goal(s): Patient will demonstrate decreased signs of withdrawal due to substance abuse  Met:No.   Target date:at discharge   As evidenced by: Patient will produce a CIWA/COWS score of 0, have stable vitals signs, and no symptoms of withdrawal.  8/12: pt reports no signs of withdrawal today but is currently on librium  taper. No CIWA/COWS taken. Stable vitals. Goal progressing.   8/17: Pt reports minimal withdrawal symptoms with CIWA score of 3 and stable  vitals.    Attendees: Patient:   04/18/2015 12:54 PM   Family:   04/18/2015 12:54 PM   Physician:  Dr. Carlton Adam, MD 04/18/2015 12:54 PM   Nursing:   Chrys Racer RN; Caryl Pina RN  04/18/2015 12:54 PM   Clinical Social Worker: Maxie Better, [place]  04/18/2015 12:54 PM   Clinical Social Worker: Erasmo Downer Drinkard LCSWA; Peri Maris LCSWA 04/18/2015 12:54 PM   Other:  Gerline Legacy Nurse Case Manager 04/18/2015 12:54 PM   Other:  Lucinda Dell; [facility]  04/18/2015 12:54 PM   Other:   04/18/2015 12:54 PM   Other:  04/18/2015 12:54 PM   Other:  04/18/2015 12:54 PM   Other:  04/18/2015 12:54 PM    04/18/2015 12:54 PM    04/18/2015 12:54 PM    04/18/2015 12:54 PM    04/18/2015 12:54 PM    Scribe for Treatment Team:   Maxie Better, [place]  04/18/2015 12:54 PM

## 2015-04-19 MED ORDER — BUSPIRONE HCL 5 MG PO TABS
5.0000 mg | ORAL_TABLET | Freq: Three times a day (TID) | ORAL | Status: DC
  Administered 2015-04-19 – 2015-04-20 (×3): 5 mg via ORAL
  Filled 2015-04-19: qty 42
  Filled 2015-04-19 (×4): qty 1
  Filled 2015-04-19 (×2): qty 42
  Filled 2015-04-19 (×2): qty 1

## 2015-04-19 NOTE — [facility] Group Notes (Signed)
[facility] Group Notes:  (Nursing/MHT/Case Management/Adjunct)  Date:  04/19/2015  Time:  12:39 PM  Type of Therapy:  Nurse Education / Leisure Activities : The group is focused on teaching patients the importance of incorporating leisure activities into our lives and how these activities can impact our recovery positively.  Participation Level:  Minimal  Participation Quality:  Drowsy  Affect:  Blunted  Cognitive:  Lacking  Insight:  Limited  Engagement in Group:  Lacking  Modes of Intervention:  Discussion  Summary of Progress/Problems:  [name] 04/19/2015, 12:39 PM

## 2015-04-19 NOTE — Plan of Care (Signed)
Problem: Alteration in mood Goal: LTG-Patient reports reduction in suicidal thoughts (Patient reports reduction in suicidal thoughts and is able to verbalize a safety plan for whenever patient is feeling suicidal)  Outcome: Adequate for Discharge Patient denies SI.

## 2015-04-19 NOTE — Plan of Care (Signed)
Problem: Diagnosis: Increased Risk For Suicide Attempt Goal: LTG-Patient Will Report Absence of Withdrawal Symptoms LTG (by discharge): Patient will report absence of withdrawal symptoms.  Outcome: Progressing Patient denied withdrawal symptoms tonight. Goal: STG-Patient Will Comply With Medication Regime Outcome: Progressing Patient taking prescribed medications tonight.

## 2015-04-19 NOTE — [facility] Group Notes (Signed)
[facility] LCSW Group Therapy  04/19/2015 12:22 PM  Type of Therapy:  Group Therapy  Participation Level:  None-pt was pulled out of group when we began in order to meet with Winferd Humphrey from [facility]. Pt excused from group.   Modes of Intervention:  Confrontation, Discussion, Education, Exploration, Problem-solving, Rapport Building, Socialization and Support  Summary of Progress/Problems:  Finding Balance in Life. Today's group focused on defining balance in one's own words, identifying things that can knock one off balance, and exploring healthy ways to maintain balance in life. Group members were asked to provide an example of a time when they felt off balance, describe how they handled that situation,and process healthier ways to regain balance in the future. Group members were asked to share the most important tool for maintaining balance that they learned while at [facility] and how they plan to apply this method after discharge.   Smart, Faren Florence LCSWA  04/19/2015, 12:22 PM

## 2015-04-19 NOTE — Clinical Social Work Note (Signed)
CSW met with pt individually to discuss aftercare. Pt met with Clearnce Sorrel ([facility]) this afternoon and was given [facility] information. Pt stated that he would make decision tonight and let CSW know during discharge planning group Friday morning what he wants to do in terms of aftercare. At this time, he does not [facility].  Maxie Better, [place] Social Worker 04/19/2015 3:36 PM

## 2015-04-19 NOTE — Progress Notes (Signed)
D: Patient in the hallway on first approach.  Patient states he had a bad day because he was anxious.  Patient states he was started on Buspar and he states he does not think it is going to work.  Patient states he does not like being around people so he refused to go to group tonight.  Patient denies SI/HI and denies HI.   A: Staff to monitor Q 15 mins for safety.  Encouragement and support offered.  Scheduled medications administered per orders. Librium administered prn for anxiety.   R: Patient remains safe on the unit.  Patient did not attend group tonight.  Patient visible on the unit and interacting with peers.  Patient taking administered medications.

## 2015-04-19 NOTE — Progress Notes (Signed)
[facility] MD Progress Note  04/19/2015 4:43 PM [name]  MRN:  [id number] Subjective:  [name] continues to have a hard time. States he woke up trough the night. Could hardly sleep. He had some more dreams, nightmares. He is not sure where to go from here. He states he is worried as he does not want to go back to using. He continues to ruminate about the people who have taken advantage of him trough the years when he trusted them as payees and then they kept most of his money.  Principal Problem: Severe recurrent major depression without psychotic features Diagnosis:   Patient Active Problem List   Diagnosis Date Noted  . Cocaine dependence with cocaine-induced mood disorder [F14.24]   . Severe recurrent major depression without psychotic features [F33.2] 04/13/2015  . Alcohol use disorder, severe, dependence [F10.20] 04/12/2015  . Alcohol abuse [F10.10] 04/07/2013  . Cocaine dependence [F14.20] 04/07/2013  . Depressive disorder, not elsewhere classified [F32.9] 04/07/2013  . Anxiety [F41.9] 03/27/2013  . Palpitations [R00.2] 03/27/2013  . Hypertension [I10] 09/08/2011  . Post traumatic stress disorder (PTSD) [F43.10] 09/08/2011  . Polysubstance dependence cocaine & alcohol [F19.20] 08/16/2011   Total Time spent with patient: 30 minutes   Past Medical History:  Past Medical History  Diagnosis Date  . Anxiety   . Bipolar 1 disorder   . Asthma   . Hypertension   . Depression   . PTSD (post-traumatic stress disorder)   . Heart palpitations     with severe anxiety    Past Surgical History  Procedure Laterality Date  . No past surgeries     Family History: History reviewed. No pertinent family history. Social History:  History  Alcohol Use  . 3.6 oz/week  . 1 Glasses of wine, 5 Cans of beer, 0 Shots of liquor, 0 Standard drinks or equivalent per week    Comment: binger on etoh and cocaine     History  Drug Use  . 1.00 per week  . Special: Marijuana, Cocaine    Comment:  last used cocaine today    Social History   Social History  . Marital Status: Single    Spouse Name: N/A  . Number of Children: N/A  . Years of Education: N/A   Social History Main Topics  . Smoking status: Never Smoker   . Smokeless tobacco: Never Used  . Alcohol Use: 3.6 oz/week    1 Glasses of wine, 5 Cans of beer, 0 Shots of liquor, 0 Standard drinks or equivalent per week     Comment: binger on etoh and cocaine  . Drug Use: 1.00 per week    Special: Marijuana, Cocaine     Comment: last used cocaine today  . Sexual Activity: No   Other Topics Concern  . None   Social History Narrative   Additional History:    Sleep: Poor  Appetite:  Fair   Assessment:   Musculoskeletal: Strength & Muscle Tone: within normal limits Gait & Station: normal Patient leans: normal   Psychiatric Specialty Exam: Physical Exam  Review of Systems  Constitutional: Positive for malaise/fatigue.  HENT: Negative.   Eyes: Negative.   Respiratory: Negative.   Cardiovascular: Negative.   Gastrointestinal: Negative.   Genitourinary: Negative.   Musculoskeletal: Negative.   Skin: Negative.   Neurological: Negative.   Endo/Heme/Allergies: Negative.   Psychiatric/Behavioral: Positive for depression and substance abuse. The patient is nervous/anxious and has insomnia.     Blood pressure 133/93, pulse 79, temperature 98.2  F (36.8 C), temperature source Oral, resp. rate 16, height  (1.727 m), weight 115.667 kg (255 lb).Body mass index is 38.78 kg/(m^2).  General Appearance: Fairly Groomed  Patent attorney::  Fair  Speech:  Clear and Coherent  Volume:  Decreased  Mood:  Anxious and Depressed  Affect:  Depressed and Restricted  Thought Process:  Coherent and Goal Directed  Orientation:  Full (Time, Place, and Person)  Thought Content:  symptoms events worries concerns  Suicidal Thoughts:  No  Homicidal Thoughts:  No  Memory:  Immediate;   Fair Recent;   Fair Remote;   Fair   Judgement:  Fair  Insight:  Present and Shallow  Psychomotor Activity:  Restlessness  Concentration:  Fair  Recall:  [employer] of Knowledge:Fair  Language: Fair  Akathisia:  Negative  Handed:  Right  AIMS (if indicated):     Assets:  Desire for Improvement  ADL's:  Intact  Cognition: WNL  Sleep:  Number of Hours: 6.75     Current Medications: Current Facility-Administered Medications  Medication Dose Route Frequency Provider Last Rate Last Dose  . acamprosate (CAMPRAL) tablet 666 mg  666 mg Oral TID Rachael Fee, MD   666 mg at 04/19/15 1218  . acetaminophen (TYLENOL) tablet 650 mg  650 mg Oral Q6H PRN Earney Navy, NP   650 mg at 04/13/15 2232  . alum & mag hydroxide-simeth (MAALOX/MYLANTA) 200-200-20 MG/5ML suspension 30 mL  30 mL Oral Q4H PRN Earney Navy, NP   30 mL at 04/13/15 0750  . busPIRone (BUSPAR) tablet 5 mg  5 mg Oral TID Rachael Fee, MD   5 mg at 04/19/15 1218  . chlordiazePOXIDE (LIBRIUM) capsule 25 mg  25 mg Oral Once Sanjuana Kava, NP      . chlordiazePOXIDE (LIBRIUM) capsule 25 mg  25 mg Oral TID PRN Rachael Fee, MD   25 mg at 04/18/15 1851  . feeding supplement (ENSURE ENLIVE) (ENSURE ENLIVE) liquid 237 mL  237 mL Oral BID BM Rachael Fee, MD   237 mL at 04/19/15 0820  . magnesium hydroxide (MILK OF MAGNESIA) suspension 30 mL  30 mL Oral Daily PRN Earney Navy, NP      . mirtazapine (REMERON) tablet 45 mg  45 mg Oral QHS Rachael Fee, MD   45 mg at 04/18/15 2150  . multivitamin with minerals tablet 1 tablet  1 tablet Oral Daily Adonis Brook, NP   1 tablet at 04/19/15 0817  . thiamine (VITAMIN B-1) tablet 100 mg  100 mg Oral Daily Adonis Brook, NP   100 mg at 04/19/15 8413    Lab Results: No results found for this or any previous visit (from the past 48 hour(s)).  Physical Findings: AIMS: Facial and Oral Movements Muscles of Facial Expression: None, normal Lips and Perioral Area: None, normal Jaw: None, normal Tongue: None,  normal,Extremity Movements Upper (arms, wrists, hands, fingers): None, normal Lower (legs, knees, ankles, toes): None, normal, Trunk Movements Neck, shoulders, hips: None, normal, Overall Severity Severity of abnormal movements (highest score from questions above): None, normal Incapacitation due to abnormal movements: None, normal Patient's awareness of abnormal movements (rate only patient's report): No Awareness, Dental Status Current problems with teeth and/or dentures?: No Does patient usually wear dentures?: No  CIWA:  CIWA-Ar Total: 0 COWS:     Treatment Plan Summary: Daily contact with patient to assess and evaluate symptoms and progress in treatment and Medication management Supportive approach/coping  skills Alcohol/cocaine dependence; work a relapse prevention plan Cravings; continue the Campral 333 mg Two TID Depression; continue the Remeron 45 mg HS Jaxsin feels the vistaril is affecting him adversely. Will D/C the vistaril  Work with CBT/mindfulness Explore residential treatment options  or other structured alternatives like [facility] etc. Medical Decision Making:  Review of Psycho-Social Stressors (1), Review or order clinical lab tests (1) and Review of New Medication or Change in Dosage (2)     Jerni Selmer A 04/19/2015, 4:43 PM

## 2015-04-19 NOTE — Progress Notes (Signed)
Provided support with [name] on [address].  [name] described history of substance use, feels this is connected to loss of mother, loss of spirituality.  Spoke with chaplain about feeling need for greater connection with his spirituality as he moves forward with recovery.    Chaplain provided prayers at [facility] request   .  Belva Crome MDiv

## 2015-04-19 NOTE — Progress Notes (Signed)
Patient attended Karaoke group tonight.  

## 2015-04-20 MED ORDER — ACAMPROSATE CALCIUM 333 MG PO TBEC: 666.0000 mg | DELAYED_RELEASE_TABLET | Freq: Three times a day (TID) | ORAL | Status: DC

## 2015-04-20 MED ORDER — MIRTAZAPINE 45 MG PO TABS: 45.0000 mg | ORAL_TABLET | Freq: Every day | ORAL | Status: DC

## 2015-04-20 MED ORDER — BUSPIRONE HCL 5 MG PO TABS: 5.0000 mg | ORAL_TABLET | Freq: Three times a day (TID) | ORAL | Status: DC

## 2015-04-20 NOTE — Progress Notes (Signed)
Nursing Discharged Note : Patient verbalizes for discharge. Denies  SI/HI / is not psychotic or delusional . D/c instructions read to pt. Pt will be going to [facility].. All belongings returned to pt who signed for same. R- Patient  verbalize understanding of discharge instructions and sign for same.transported by nephew. A- Escorted to lobby

## 2015-04-20 NOTE — Progress Notes (Addendum)
  [facility] Adult Case Management Discharge Plan :  Will you be returning to the same living situation after discharge:  No.will stay with a friend until he decides if he wants to [facility] into [facility].  At discharge, do you have transportation home?: Yes,  family member  Do you have the ability to pay for your medications: Yes,  medicare  Release of information consent forms completed and submitted to medical records by CSW.  Patient to Follow up at: Follow-up Information    [facility] with [facility] On 04/24/2015.   Why:  Arrive between 9am-12pm for hospital follow-up/assessment for services (medication management, therapy, and Substance abuse intensive outpatient).   Contact information:   [address] [place], [place] [address] Phone: [phone] Fax:      Valinda Hoar for [facility]: [phone]  Patient denies SI/HI: Yes,  during group/self report.    Safety Planning and Suicide Prevention discussed: Yes,  SPE completed with pt, as he refused to allow family contact for SPE. SPI pamphlet provided to pt and he was encouraged to share information with support network, ask questions, and talk about any concerns relating to SPE>  Have you used any form of tobacco in the last 30 days? (Cigarettes, Smokeless Tobacco, Cigars, and/or Pipes): No  Has patient been referred to the Quitline?: N/A patient is not a smoker  Smart, [place] LCSWA  04/20/2015, 9:45 AM

## 2015-04-20 NOTE — [facility] Suicide Risk Assessment (Signed)
[facility] Discharge Suicide Risk Assessment   Demographic Factors:  Male  Total Time spent with patient: 30 minutes  Musculoskeletal: Strength & Muscle Tone: within normal limits Gait & Station: normal Patient leans: normal  Psychiatric Specialty Exam: Physical Exam  Review of Systems  Constitutional: Negative.   HENT: Negative.   Eyes: Negative.   Respiratory: Negative.   Cardiovascular: Negative.   Gastrointestinal: Negative.   Genitourinary: Negative.   Musculoskeletal: Negative.   Skin: Negative.   Neurological: Negative.   Endo/Heme/Allergies: Negative.   Psychiatric/Behavioral: Positive for substance abuse.    Blood pressure 133/93, pulse 79, temperature 98.2 F (36.8 C), temperature source Oral, resp. rate 16, height  (1.727 m), weight 115.667 kg (255 lb).Body mass index is 38.78 kg/(m^2).  General Appearance: Fairly Groomed  Patent attorney::  Fair  Speech:  Clear and Coherent409  Volume:  Normal  Mood:  Euthymic  Affect:  Appropriate  Thought Process:  Coherent and Goal Directed  Orientation:  Full (Time, Place, and Person)  Thought Content:  plans as he moves on, relapse prevention plan  Suicidal Thoughts:  No  Homicidal Thoughts:  No  Memory:  Immediate;   Fair Recent;   Fair Remote;   Fair  Judgement:  Fair  Insight:  Present  Psychomotor Activity:  Normal  Concentration:  Fair  Recall:  [employer] of Knowledge:Fair  Language: Fair  Akathisia:  No  Handed:  Right  AIMS (if indicated):     Assets:  Desire for Improvement  Sleep:  Number of Hours: 6.75  Cognition: WNL  ADL's:  Intact   Have you used any form of tobacco in the last 30 days? (Cigarettes, Smokeless Tobacco, Cigars, and/or Pipes): No  Has this patient used any form of tobacco in the last 30 days? (Cigarettes, Smokeless Tobacco, Cigars, and/or Pipes) No  Mental Status Per Nursing Assessment::   On Admission:  Suicidal ideation indicated by patient  Current Mental Status by Physician:  In full contact with reality. There are no active S/S of withdrawal. There are no active SI/HI plans or intent. States he has a nephew he trusts who has come forward willing to help him with his social security situation. He is going to be going to a half way house. He states he is committed to abstinence.   Loss Factors: NA  Historical Factors: NA  Risk Reduction Factors:   Living with another person, especially a relative  Continued Clinical Symptoms:  Depression:   Comorbid alcohol abuse/dependence Alcohol/Substance Abuse/Dependencies  Cognitive Features That Contribute To Risk:  Closed-mindedness, Polarized thinking and Thought constriction (tunnel vision)    Suicide Risk:  Minimal: No identifiable suicidal ideation.  Patients presenting with no risk factors but with morbid ruminations; may be classified as minimal risk based on the severity of the depressive symptoms  Principal Problem: Severe recurrent major depression without psychotic features Discharge Diagnoses:  Patient Active Problem List   Diagnosis Date Noted  . Cocaine dependence with cocaine-induced mood disorder [F14.24]   . Severe recurrent major depression without psychotic features [F33.2] 04/13/2015  . Alcohol use disorder, severe, dependence [F10.20] 04/12/2015  . Alcohol abuse [F10.10] 04/07/2013  . Cocaine dependence [F14.20] 04/07/2013  . Depressive disorder, not elsewhere classified [F32.9] 04/07/2013  . Anxiety [F41.9] 03/27/2013  . Palpitations [R00.2] 03/27/2013  . Hypertension [I10] 09/08/2011  . Post traumatic stress disorder (PTSD) [F43.10] 09/08/2011  . Polysubstance dependence cocaine & alcohol [F19.20] 08/16/2011      Plan Of Care/Follow-up recommendations:  Activity:  as tolerated Diet:  regular Follow up outpatient basis Is patient on multiple antipsychotic therapies at discharge:  No   Has Patient had three or more failed trials of antipsychotic monotherapy by history:   No  Recommended Plan for Multiple Antipsychotic Therapies: NA    Zyah Gomm A 04/20/2015, 8:49 AM

## 2015-04-20 NOTE — Discharge Summary (Signed)
Physician Discharge Summary Note  Patient:  [name] is an 44 y.o., male MRN:  [id number] DOB:  June 27, 1971 Patient phone:  [phone] (home)  Patient address:   [address] [place] [address],  Total Time spent with patient: 45 minutes  Date of Admission:  04/12/2015 Date of Discharge: 04/20/2015  Reason for Admission:  depression  Principal Problem: Severe recurrent major depression without psychotic features Discharge Diagnoses: Patient Active Problem List   Diagnosis Date Noted  . Cocaine dependence with cocaine-induced mood disorder [F14.24]   . Severe recurrent major depression without psychotic features [F33.2] 04/13/2015  . Alcohol use disorder, severe, dependence [F10.20] 04/12/2015  . Alcohol abuse [F10.10] 04/07/2013  . Cocaine dependence [F14.20] 04/07/2013  . Depressive disorder, not elsewhere classified [F32.9] 04/07/2013  . Anxiety [F41.9] 03/27/2013  . Palpitations [R00.2] 03/27/2013  . Hypertension [I10] 09/08/2011  . Post traumatic stress disorder (PTSD) [F43.10] 09/08/2011  . Polysubstance dependence cocaine & alcohol [F19.20] 08/16/2011   Musculoskeletal: Strength & Muscle Tone: within normal limits Gait & Station: normal Patient leans: N/A  Psychiatric Specialty Exam: Physical Exam  Vitals reviewed.   Review of Systems  All other systems reviewed and are negative.   Blood pressure 134/96, pulse 88, temperature 99.9 F (37.7 C), temperature source Oral, resp. rate 16, height 5\' 8"  (1.727 m), weight 115.667 kg (255 lb).Body mass index is 38.78 kg/(m^2).   General Appearance: Fairly Groomed  Patent attorney:: Fair  Speech: Clear and Coherent  Volume: Normal  Mood: Euthymic  Affect: Appropriate  Thought Process: Coherent and Goal Directed  Orientation: Full (Time, Place, and Person)  Thought Content: plans as he moves on, relapse prevention plan  Suicidal Thoughts: No  Homicidal Thoughts: No  Memory: Immediate;  Fair Recent; Fair Remote; Fair  Judgement: Fair  Insight: Present  Psychomotor Activity: Normal  Concentration: Fair  Recall: [employer] of Knowledge:Fair  Language: Fair  Akathisia: No  Handed: Right  AIMS (if indicated):    Assets: Desire for Improvement  Sleep: Number of Hours: 6.75  Cognition: WNL  ADL's: Intact       Have you used any form of tobacco in the last 30 days? (Cigarettes, Smokeless Tobacco, Cigars, and/or Pipes): No  Has this patient used any form of tobacco in the last 30 days? (Cigarettes, Smokeless Tobacco, Cigars, and/or Pipes) N/A  Past Medical History:  Past Medical History  Diagnosis Date  . Anxiety   . Bipolar 1 disorder   . Asthma   . Hypertension   . Depression   . PTSD (post-traumatic stress disorder)   . Heart palpitations     with severe anxiety    Past Surgical History  Procedure Laterality Date  . No past surgeries     Family History: History reviewed. No pertinent family history. Social History:  History  Alcohol Use  . 3.6 oz/week  . 1 Glasses of wine, 5 Cans of beer, 0 Shots of liquor, 0 Standard drinks or equivalent per week    Comment: binger on etoh and cocaine     History  Drug Use  . 1.00 per week  . Special: Marijuana, Cocaine    Comment: last used cocaine today    Social History   Social History  . Marital Status: Single    Spouse Name: N/A  . Number of Children: N/A  . Years of Education: N/A   Social History Main Topics  . Smoking status: Never Smoker   . Smokeless tobacco: Never Used  .  Alcohol Use: 3.6 oz/week    1 Glasses of wine, 5 Cans of beer, 0 Shots of liquor, 0 Standard drinks or equivalent per week     Comment: binger on etoh and cocaine  . Drug Use: 1.00 per week    Special: Marijuana, Cocaine     Comment: last used cocaine today  . Sexual Activity: No   Other Topics Concern  . None   Social History Narrative   Risk to Self: Is patient at risk for suicide?:  Yes Risk to Others:   Prior Inpatient Therapy:   Prior Outpatient Therapy:    Level of Care:  OP  Hospital Course:  CAYLAN SCHIFANO was admitted for Severe recurrent major depression without psychotic features and crisis management.  He was treated discharged with the medications listed below under Medication List.  Medical problems were identified and treated as needed.  Home medications were restarted as appropriate.  Improvement was monitored by observation and Aggie Hacker daily report of symptom reduction.  Emotional and mental status was monitored by daily self-inventory reports completed by Aggie Hacker and clinical staff.         Aggie Hacker was evaluated by the treatment team for stability and plans for continued recovery upon discharge.  Aggie Hacker motivation was an integral factor for scheduling further treatment.  Employment, transportation, bed availability, health status, family support, and any pending legal issues were also considered during his hospital stay.  He was offered further treatment options upon discharge including but not limited to Residential, Intensive Outpatient, and Outpatient treatment.  Aggie Hacker will follow up with the services as listed below under Follow Up Information.     Upon completion of this admission the patient was both mentally and medically stable for discharge denying suicidal/homicidal ideation, auditory/visual/tactile hallucinations, delusional thoughts and paranoia.      Consults:  psychiatry  Significant Diagnostic Studies:  labs: per ED  Discharge Vitals:   Blood pressure 134/96, pulse 88, temperature 99.9 F (37.7 C), temperature source Oral, resp. rate 16, height 5\' 8"  (1.727 m), weight 115.667 kg (255 lb). Body mass index is 38.78 kg/(m^2). Lab Results:   No results found for this or any previous visit (from the past 72 hour(s)).  Physical Findings: AIMS: Facial and Oral Movements Muscles of Facial Expression: None,  normal Lips and Perioral Area: None, normal Jaw: None, normal Tongue: None, normal,Extremity Movements Upper (arms, wrists, hands, fingers): None, normal Lower (legs, knees, ankles, toes): None, normal, Trunk Movements Neck, shoulders, hips: None, normal, Overall Severity Severity of abnormal movements (highest score from questions above): None, normal Incapacitation due to abnormal movements: None, normal Patient's awareness of abnormal movements (rate only patient's report): No Awareness, Dental Status Current problems with teeth and/or dentures?: No Does patient usually wear dentures?: No  CIWA:  CIWA-Ar Total: 3 COWS:     See Psychiatric Specialty Exam and Suicide Risk Assessment completed by Attending Physician prior to discharge.  Discharge destination:  Home  Is patient on multiple antipsychotic therapies at discharge:  No   Has Patient had three or more failed trials of antipsychotic monotherapy by history:  No  Recommended Plan for Multiple Antipsychotic Therapies: NA    Medication List    STOP taking these medications        albuterol 108 (90 BASE) MCG/ACT inhaler  Commonly known as:  PROVENTIL HFA;VENTOLIN HFA     lisinopril 20 MG tablet  Commonly known as:  PRINIVIL,ZESTRIL  TAKE these medications      Indication   acamprosate 333 MG tablet  Commonly known as:  CAMPRAL  Take 2 tablets (666 mg total) by mouth 3 (three) times daily.   Indication:  Excessive Use of Alcohol     busPIRone 5 MG tablet  Commonly known as:  BUSPAR  Take 1 tablet (5 mg total) by mouth 3 (three) times daily.   Indication:  Anxiety Disorder     mirtazapine 45 MG tablet  Commonly known as:  REMERON  Take 1 tablet (45 mg total) by mouth at bedtime.   Indication:  Trouble Sleeping, Major Depressive Disorder       Follow-up Information    [facility] with [facility] On 04/24/2015.   Why:  Arrive between 9am-12pm for hospital follow-up/assessment for services  (medication management, therapy, and Substance abuse intensive outpatient).   Contact information:   [address] [place], [place] [address] Phone: [phone] Fax: [phone]      Follow-up recommendations:  Activity:  as tol, diet as tol  Comments:  1.  Take all your medications as prescribed.              2.  Report any adverse side effects to outpatient provider.                       3.  Patient instructed to not use alcohol or illegal drugs while on prescription medicines.            4.  In the event of worsening symptoms, instructed patient to call 911, the crisis hotline or go to nearest emergency room for evaluation of symptoms.  Total Discharge Time:  40 min  Signed: Velna Hatchet May Agustin AGNP-BC 04/20/2015, 4:25 PM  I personally assessed the patient and formulated the plan Madie Reno A. Dub Mikes, M.D.

## 2015-04-20 NOTE — Tx Team (Signed)
Interdisciplinary Treatment Plan Update (Adult)  Date:  04/20/2015  Time Reviewed:  9:46 AM   Progress in Treatment: Attending groups: Yes. Participating in groups:  Yes. Taking medication as prescribed:  Yes. Tolerating medication:  Yes. Family/Significant othe contact made:  SPE completed with pt, as he refused to consent to family contact.   Patient understands diagnosis:  Yes. and As evidenced by:  seeking treatment for SI, depression, PTSD symptoms, ETOH/cocaine abuse. Discussing patient identified problems/goals with staff:  Yes. Medical problems stabilized or resolved:  Yes. Denies suicidal/homicidal ideation: Yes. Issues/concerns per patient self-inventory:  Other:  Discharge Plan or Barriers: Pt will follow up at [facility] and is looking into [facility]/met with [name] from [place] yesterday. Pt plans to stay with a family members until his check comes in Sept 1st. Mental health association information also provided.   Reason for Continuation of Hospitalization: none  Comments:  [name] is an 44 y.o. male presenting to [facility] with increased depression and suicidal thoughts. Patient reports feelings of hopelessness, fatigue, isolating self from others, and crying spells. Sts that he is having a hard time getting past the death of his mother. His mother passed away 11/17/2014. Sts that he found his mothers deceased body which also brought up issues related to his PTSD. Patient reports suicidal thoughts ongoing for the past 2 months. He has no desire to live. Denies a suicidal plan but is unable to contract for safety. No access to firearms or means. He denies HI and AVH's. He also reports self medicating self with alcohol and cocaine. 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 Onset was age 80. Patient also has a history of black outs. Patient hospitalized at [facility] multiple times. He was also hospitalized at [facility]. He does not have a current psychiatrist or  therapist. Sts he was receiving ACT services from [facility] months ago but he was discharged. Patient sts he doesn't know why he was discharged. Major Depressive Disorder, Recurrent, Severe, without psychotic features and Substance Abuse  Estimated length of stay:  Dc today   Additional Comments:  Patient and CSW reviewed pt's identified goals and treatment plan. Patient verbalized understanding and agreed to treatment plan. CSW reviewed [facility] "Discharge Process and Patient Involvement" Form. Pt verbalized understanding of information provided and signed form.    Review of initial/current patient goals per problem list:  1. Goal(s): Patient will participate in aftercare plan  Met: Yes   Target date: at discharge  As evidenced by: Patient will participate within aftercare plan AEB aftercare provider and housing plan at discharge being identified.  8/12: Pt expressed interest in moving into sober living house. CSW assessing for appropriate referrals.   8/17: Pt continues to isolate in room often. CSW working with pt to establish aftercare plan, being that he was recently dropped from [facility].   8/19: Not eligible for ACTT services due to no medicaid and no iprs funding for ACTT available. Pt to followup for outpatient services and assessment at [facility] and will stay with a family member until he gets disability check and can get into [facility].   2. Goal (s): Patient will exhibit decreased depressive symptoms and suicidal ideations.  Met: Yes     Target date: at discharge  As evidenced by: Patient will utilize self rating of depression at 3 or below and demonstrate decreased signs of depression or be deemed stable for discharge by MD.  8/12: Pt rates depression as  8/10 and presents with depressed mood/flat affect. No SI/HI/AVH reported this morning.   8/17: PT rates depression as 8/10 and continues to present with depressed mood/irritable affect.   8/19: Pt rates depression  as 0/10 and presents with pleasant mood/calm affect.   3. Goal(s): Patient will demonstrate decreased signs of withdrawal due to substance abuse  Met:Yes   Target date:at discharge   As evidenced by: Patient will produce a CIWA/COWS score of 0, have stable vitals signs, and no symptoms of withdrawal.  8/12: pt reports no signs of withdrawal today but is currently on librium taper. No CIWA/COWS taken. Stable vitals. Goal progressing.   8/17: Pt reports minimal withdrawal symptoms with CIWA score of 3 and stable vitals.   8/19: Pt reports no signs of withdrawal with CIWA score of 3 and stable vitals.    Attendees: Patient:   04/20/2015 9:46 AM   Family:   04/20/2015 9:46 AM   Physician:  Dr. Carlton Adam, MD 04/20/2015 9:46 AM   Nursing:   Marcus Cola RN 04/20/2015 9:46 AM   Clinical Social Worker: [name], [place]  04/20/2015 9:46 AM   Clinical Social Worker: [name] [name]; [name] 04/20/2015 9:46 AM   Other:  [name] Nurse Case Manager 04/20/2015 9:46 AM   Other:  [name]; [facility]  04/20/2015 9:46 AM   Other:   04/20/2015 9:46 AM   Other:  04/20/2015 9:46 AM   Other:  04/20/2015 9:46 AM   Other:  04/20/2015 9:46 AM    04/20/2015 9:46 AM    04/20/2015 9:46 AM    04/20/2015 9:46 AM    04/20/2015 9:46 AM    Scribe for Treatment Team:   [name], [place]  04/20/2015 9:46 AM

## 2015-04-21 ENCOUNTER — Emergency Department (HOSPITAL_COMMUNITY)
Admission: EM | Admit: 2015-04-21 | Discharge: 2015-04-22 | Discharge status: Against Medical Advice | Payer: Medicare Other | Attending: Emergency Medicine | Admitting: Emergency Medicine

## 2015-04-21 DIAGNOSIS — I10 Essential (primary) hypertension: Secondary | ICD-10-CM | POA: Insufficient documentation

## 2015-04-21 DIAGNOSIS — J45909 Unspecified asthma, uncomplicated: Secondary | ICD-10-CM | POA: Insufficient documentation

## 2015-04-21 DIAGNOSIS — F419 Anxiety disorder, unspecified: Secondary | ICD-10-CM | POA: Insufficient documentation

## 2015-04-21 NOTE — ED Notes (Signed)
Patient stated that he had just wanted to check his vitals after having an argument with a PTSD provider. Patient stated that he had worked out his anxiety with said provider and "wanted to leave" prior to the start of triage. Patient seemed calm and stated "he was fine now". Patient left before being triaged.

## 2015-04-22 ENCOUNTER — Emergency Department (HOSPITAL_COMMUNITY)
Admission: EM | Admit: 2015-04-22 | Discharge: 2015-04-22 | Disposition: A | Discharge status: Home / Self Care | Payer: Medicare Other | Attending: Emergency Medicine | Admitting: Emergency Medicine

## 2015-04-22 ENCOUNTER — Encounter (HOSPITAL_COMMUNITY): Payer: Self-pay | Admitting: Emergency Medicine

## 2015-04-22 ENCOUNTER — Encounter (HOSPITAL_COMMUNITY): Payer: Self-pay | Admitting: *Deleted

## 2015-04-22 ENCOUNTER — Emergency Department (HOSPITAL_COMMUNITY)
Admission: EM | Admit: 2015-04-22 | Discharge: 2015-04-23 | Disposition: A | Discharge status: Other Acute Inpatient Hospital | Payer: Medicare Other | Source: Home / Self Care | Attending: Emergency Medicine | Admitting: Emergency Medicine

## 2015-04-22 DIAGNOSIS — F32A Depression, unspecified: Secondary | ICD-10-CM

## 2015-04-22 DIAGNOSIS — F1910 Other psychoactive substance abuse, uncomplicated: Secondary | ICD-10-CM

## 2015-04-22 DIAGNOSIS — F319 Bipolar disorder, unspecified: Secondary | ICD-10-CM | POA: Diagnosis not present

## 2015-04-22 DIAGNOSIS — F1424 Cocaine dependence with cocaine-induced mood disorder: Secondary | ICD-10-CM | POA: Insufficient documentation

## 2015-04-22 DIAGNOSIS — I10 Essential (primary) hypertension: Secondary | ICD-10-CM | POA: Insufficient documentation

## 2015-04-22 DIAGNOSIS — F329 Major depressive disorder, single episode, unspecified: Secondary | ICD-10-CM

## 2015-04-22 DIAGNOSIS — F1020 Alcohol dependence, uncomplicated: Secondary | ICD-10-CM | POA: Insufficient documentation

## 2015-04-22 DIAGNOSIS — F419 Anxiety disorder, unspecified: Secondary | ICD-10-CM | POA: Insufficient documentation

## 2015-04-22 DIAGNOSIS — F1420 Cocaine dependence, uncomplicated: Secondary | ICD-10-CM | POA: Insufficient documentation

## 2015-04-22 DIAGNOSIS — Z79899 Other long term (current) drug therapy: Secondary | ICD-10-CM | POA: Insufficient documentation

## 2015-04-22 DIAGNOSIS — F323 Major depressive disorder, single episode, severe with psychotic features: Secondary | ICD-10-CM | POA: Insufficient documentation

## 2015-04-22 DIAGNOSIS — F332 Major depressive disorder, recurrent severe without psychotic features: Secondary | ICD-10-CM | POA: Diagnosis present

## 2015-04-22 DIAGNOSIS — Z008 Encounter for other general examination: Secondary | ICD-10-CM | POA: Diagnosis present

## 2015-04-22 DIAGNOSIS — J45909 Unspecified asthma, uncomplicated: Secondary | ICD-10-CM | POA: Insufficient documentation

## 2015-04-22 DIAGNOSIS — F4310 Post-traumatic stress disorder, unspecified: Secondary | ICD-10-CM | POA: Insufficient documentation

## 2015-04-22 DIAGNOSIS — F1310 Sedative, hypnotic or anxiolytic abuse, uncomplicated: Secondary | ICD-10-CM | POA: Insufficient documentation

## 2015-04-22 DIAGNOSIS — R45851 Suicidal ideations: Secondary | ICD-10-CM | POA: Diagnosis present

## 2015-04-22 LAB — CBC
HEMATOCRIT: 46.1 % (ref 39.0–52.0)
HEMOGLOBIN: 14.9 g/dL (ref 13.0–17.0)
MCH: 27.2 pg (ref 26.0–34.0)
MCHC: 32.3 g/dL (ref 30.0–36.0)
MCV: 84.1 fL (ref 78.0–100.0)
Platelets: 300 10*3/uL (ref 150–400)
RBC: 5.48 MIL/uL (ref 4.22–5.81)
RDW: 14.4 % (ref 11.5–15.5)
WBC: 8.1 10*3/uL (ref 4.0–10.5)

## 2015-04-22 LAB — SALICYLATE LEVEL: Salicylate Lvl: <4 mg/dL (ref 2.8–30.0)

## 2015-04-22 LAB — CBC WITH DIFFERENTIAL/PLATELET
Basophils Absolute: 0 10*3/uL (ref 0.0–0.1)
Basophils Relative: 0 % (ref 0–1)
Eosinophils Absolute: 0.3 10*3/uL (ref 0.0–0.7)
Eosinophils Relative: 4 % (ref 0–5)
HEMATOCRIT: 42.2 % (ref 39.0–52.0)
HEMOGLOBIN: 14.1 g/dL (ref 13.0–17.0)
LYMPHS ABS: 2.5 10*3/uL (ref 0.7–4.0)
LYMPHS PCT: 33 % (ref 12–46)
MCH: 27.5 pg (ref 26.0–34.0)
MCHC: 33.4 g/dL (ref 30.0–36.0)
MCV: 82.3 fL (ref 78.0–100.0)
MONO ABS: 0.7 10*3/uL (ref 0.1–1.0)
MONOS PCT: 9 % (ref 3–12)
NEUTROS ABS: 4.3 10*3/uL (ref 1.7–7.7)
NEUTROS PCT: 54 % (ref 43–77)
Platelets: 251 10*3/uL (ref 150–400)
RBC: 5.13 MIL/uL (ref 4.22–5.81)
RDW: 14.3 % (ref 11.5–15.5)
WBC: 7.8 10*3/uL (ref 4.0–10.5)

## 2015-04-22 LAB — BASIC METABOLIC PANEL
Anion gap: 8 (ref 5–15)
BUN: 7 mg/dL (ref 6–20)
CHLORIDE: 106 mmol/L (ref 101–111)
CO2: 27 mmol/L (ref 22–32)
CREATININE: 1.01 mg/dL (ref 0.61–1.24)
Calcium: 8.4 mg/dL — ABNORMAL LOW (ref 8.9–10.3)
GFR calc non Af Amer: >60 mL/min (ref >60)
GLUCOSE: 93 mg/dL (ref 65–99)
Potassium: 3.9 mmol/L (ref 3.5–5.1)
Sodium: 141 mmol/L (ref 135–145)

## 2015-04-22 LAB — COMPREHENSIVE METABOLIC PANEL
ALBUMIN: 4.2 g/dL (ref 3.5–5.0)
ALT: 18 U/L (ref 17–63)
AST: 24 U/L (ref 15–41)
Alkaline Phosphatase: 116 U/L (ref 38–126)
Anion gap: 11 (ref 5–15)
BILIRUBIN TOTAL: 0.6 mg/dL (ref 0.3–1.2)
BUN: 15 mg/dL (ref 6–20)
CHLORIDE: 107 mmol/L (ref 101–111)
CO2: 26 mmol/L (ref 22–32)
CREATININE: 1.2 mg/dL (ref 0.61–1.24)
Calcium: 9.1 mg/dL (ref 8.9–10.3)
GFR calc Af Amer: >60 mL/min (ref >60)
GFR calc non Af Amer: >60 mL/min (ref >60)
GLUCOSE: 96 mg/dL (ref 65–99)
POTASSIUM: 3.6 mmol/L (ref 3.5–5.1)
Sodium: 144 mmol/L (ref 135–145)
TOTAL PROTEIN: 7.8 g/dL (ref 6.5–8.1)

## 2015-04-22 LAB — ACETAMINOPHEN LEVEL

## 2015-04-22 LAB — RAPID URINE DRUG SCREEN, HOSP PERFORMED
Amphetamines: NOT DETECTED
BARBITURATES: NOT DETECTED
Benzodiazepines: POSITIVE — AB
Cocaine: POSITIVE — AB
Opiates: NOT DETECTED
TETRAHYDROCANNABINOL: NOT DETECTED

## 2015-04-22 LAB — ETHANOL
Alcohol, Ethyl (B): <5 mg/dL (ref <5)
Alcohol, Ethyl (B): <5 mg/dL (ref <5)

## 2015-04-22 MED ORDER — LORAZEPAM 1 MG PO TABS
1.0000 mg | ORAL_TABLET | Freq: Three times a day (TID) | ORAL | Status: DC | PRN
  Administered 2015-04-23: 1 mg via ORAL
  Filled 2015-04-22: qty 1

## 2015-04-22 MED ORDER — THIAMINE HCL 100 MG/ML IJ SOLN: 100.0000 mg | Freq: Every day | INTRAMUSCULAR | Status: DC

## 2015-04-22 MED ORDER — LORAZEPAM 1 MG PO TABS: 0.0000 mg | ORAL_TABLET | Freq: Four times a day (QID) | ORAL | Status: DC

## 2015-04-22 MED ORDER — ONDANSETRON HCL 4 MG PO TABS: 4.0000 mg | ORAL_TABLET | Freq: Three times a day (TID) | ORAL | Status: DC | PRN

## 2015-04-22 MED ORDER — ZOLPIDEM TARTRATE 5 MG PO TABS
5.0000 mg | ORAL_TABLET | Freq: Every evening | ORAL | Status: DC | PRN
  Administered 2015-04-23: 5 mg via ORAL
  Filled 2015-04-22: qty 1

## 2015-04-22 MED ORDER — LISINOPRIL 20 MG PO TABS
20.0000 mg | ORAL_TABLET | Freq: Every day | ORAL | Status: DC
  Administered 2015-04-23: 20 mg via ORAL
  Filled 2015-04-22: qty 1

## 2015-04-22 MED ORDER — MIRTAZAPINE 30 MG PO TABS
45.0000 mg | ORAL_TABLET | Freq: Every day | ORAL | Status: DC
  Administered 2015-04-23: 45 mg via ORAL
  Filled 2015-04-22: qty 2

## 2015-04-22 MED ORDER — ACETAMINOPHEN 325 MG PO TABS: 650.0000 mg | ORAL_TABLET | ORAL | Status: DC | PRN

## 2015-04-22 MED ORDER — VITAMIN B-1 100 MG PO TABS
100.0000 mg | ORAL_TABLET | Freq: Every day | ORAL | Status: DC
  Administered 2015-04-23: 100 mg via ORAL
  Filled 2015-04-22: qty 1

## 2015-04-22 MED ORDER — LORAZEPAM 1 MG PO TABS: 0.0000 mg | ORAL_TABLET | Freq: Two times a day (BID) | ORAL | Status: DC

## 2015-04-22 MED ORDER — BUSPIRONE HCL 10 MG PO TABS
5.0000 mg | ORAL_TABLET | Freq: Three times a day (TID) | ORAL | Status: DC
  Administered 2015-04-23 (×2): 5 mg via ORAL
  Filled 2015-04-22 (×2): qty 1

## 2015-04-22 NOTE — ED Notes (Signed)
Bed: [facility] Expected date:  Expected time:  Means of arrival:  Comments: Hold for triage 5
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
## 2015-04-22 NOTE — ED Notes (Signed)
Pt brought in by [employer] with IVC papers from [facility].  Sent here d/t pt verbalizing SI.  Was just seen in the ED twice for same and were cleared both times.  Pt is homeless.  Pt reported to [facility] personnel that he is suicidal and could not contract for safety.

## 2015-04-22 NOTE — ED Notes (Signed)
Pt has black boots , a pair of red shorts and a pair of black shorts and a black wallet in his pocket. Pt has been cleared by security.

## 2015-04-22 NOTE — ED Notes (Signed)
EDP at bedside, pt told EDP and nurse he was suicidal, thoughts of jumping infront of car

## 2015-04-22 NOTE — ED Notes (Signed)
Pt in by EMS. C/o SI without plan per ems. Pt was just d/c from [facility] ED this am for same complaint after being evaluated by TTS and SW.
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
## 2015-04-22 NOTE — ED Notes (Signed)
Dr. Oni at bedside. 

## 2015-04-22 NOTE — B[facility] Counselor (Signed)
Pt's RN Brett Canales will put teleassessment machine in pt's room in 10 mins. Writer will conduct TA.   [name], [place] Therapeutic Triage Specialist

## 2015-04-22 NOTE — ED Notes (Signed)
Patient is awake, telepsych machine is in room; sitter at [employer]

## 2015-04-22 NOTE — ED Notes (Signed)
To be screened by ED MD as pt was just d/c from [facility] ED this am. Will not place protocol SI orders at this point for this reason.

## 2015-04-22 NOTE — ED Notes (Signed)
Spoke with Mora Bellman MD about pt D/C. Pt told 2 different nurses he was suicidal, MD says pt stated he was not. MD said to D/C. Nurse asked pt again if he wanted to harm himself and pt did say yes. MD notified of this

## 2015-04-22 NOTE — [facility] Counselor (Signed)
Writer called and asked Alona Bene CSW at [facility] to see pt. CSW reports she will speak w/ pt re: social support issues.  Evette Cristal, [place] Therapeutic Triage Specialist

## 2015-04-22 NOTE — ED Notes (Signed)
Patient going to shower at this time; sitter is with patient; changed all of patient's stretcher linens and pillow case covering

## 2015-04-22 NOTE — Discharge Instructions (Signed)
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
## 2015-04-22 NOTE — Discharge Instructions (Signed)
Finding Treatment for Alcohol and Drug Addiction [name], call the attached resources today for help with your addiction.  If any symptoms worsen, come back to the ED immediately.  Thank you. It can be hard to find the right place to get professional treatment. Here are some important things to consider:  There are different types of treatment to choose from.  Some programs are live-in (residential) while others are not (outpatient). Sometimes a combination is offered.  No single type of program is right for everyone.  Most treatment programs involve a combination of education, counseling, and a 12-step, spiritually-based approach.  There are non-spiritually based programs (not 12-step).  Some treatment programs are government sponsored. They are geared for patients without private insurance.  Treatment programs can vary in many respects such as:  Cost and types of insurance accepted.  Types of on-site medical services offered.  Length of stay, setting, and size.  Overall philosophy of treatment. A person may need specialized treatment or have needs not addressed by all programs. For example, adolescents need treatment appropriate for their age. Other people have secondary disorders that must be managed as well. Secondary conditions can include mental illness, such as depression or diabetes. Often, a period of detoxification from alcohol or drugs is needed. This requires medical supervision and not all programs offer this. THINGS TO CONSIDER WHEN SELECTING A TREATMENT PROGRAM   Is the program certified by the appropriate government agency? Even private programs must be certified and employ certified professionals.  Does the program accept your insurance? If not, can a payment plan be set up?  Is the facility clean, organized, and well run? Do they allow you to speak with graduates who can share their treatment experience with you? Can you tour the facility? Can you meet with  staff?  Does the program meet the full range of individual needs?  Does the treatment program address sexual orientation and physical disabilities? Do they provide age, gender, and culturally appropriate treatment services?  Is treatment available in languages other than English?  Is long-term aftercare support or guidance encouraged and provided?  Is assessment of an individual's treatment plan ongoing to ensure it meets changing needs?  Does the program use strategies to encourage reluctant patients to remain in treatment long enough to increase the likelihood of success?  Does the program offer counseling (individual or group) and other behavioral therapies?  Does the program offer medicine as part of the treatment regimen, if needed?  Is there ongoing monitoring of possible relapse? Is there a defined relapse prevention program? Are services or referrals offered to family members to ensure they understand addiction and the recovery process? This would help them support the recovering individual.  Are 12-step meetings held at the center or is transport available for patients to attend outside meetings? In countries outside of the [place]. and [place], Magazine features editor for contact information for services in your area. Document Released: 07/17/2005 Document Revised: 11/10/2011 Document Reviewed: 01/27/2008 [facility] Patient Information 2015 [place], [place]. This information is not intended to replace advice given to you by your health care provider. Make sure you discuss any questions you have with your health care provider. Substance Abuse Treatment Programs  Intensive Outpatient Programs [facility]     [address]      [place], [place]                   [phone]  [facility] [address] [place] [address] [place], [place] [phone]  [facility]     (Inpatient and outpatient)     [address]  Dr[address]           [phone]    [facility] [phone] (Suboxone and Methadone)  [address]      [place], [place] [address]      [phone]       [address] [place], [place] [phone]  [facility] (Outpatient/Inpatient, Chemical)    (insurance only) [phone]             [facility] (Groups & Residential) [place], [place] [phone]     [facility]     [address]     [place], [place]      [phone]       [facility] (for caregivers and family) [phone] [address] [place], [place] [phone]      Residential Treatment Programs [facility]      [address] [place], [place] [address]  [phone]       [facility] [address], [place], [place] [address] [phone]  [facility] [place]        [phone]       [facility] [phone]  [facility] (Addiction Recovery Care [facility]oc[facility]             [address]                                         [place], [place]                                                [phone] or [phone]                               [facility] [place] [address] [place], [address] [phone]  [facility]    [address]      [place], [place]     [phone]       [facility] [address] [place], [place] [phone]  [facility]   [address] [name], [place] [address]     [phone]      Admissions: 8am-3pm M-F  [facility] ([facility]) [address] [place], [place] [phone]  [facility] Program: Residential Program [phone] Days)   [place], [place]      [phone] or [phone]     [facility]: [facility] [place], [place] (Walk in Hours over the weekend or by referral)  [facility] [address] [place], [place], [place] [address] [phone]  Crisis Mobile: Therapeutic  Alternatives:  [phone] (for crisis response 24 hours a day) [facility] Hotline:      2046079178 Outpatient Psychiatry and Counseling  Therapeutic Alternatives: Mobile Crisis Management 24 hours:  [phone]  [facility] [employer] sliding scale fee and walk in schedule: M-F 8am-12pm/1pm-3pm [address]  [name], [place] [address] [phone]  [facility] [address] [place], [place] [address] [phone]  [facility] (Formerly known as [facility], adult education)- new patient walk-in appointments  available Monday - Friday 8am -3pm.          [address] [place], [place] [address] [phone] or crisis line- [phone]  [facility] Services/ Intensive Outpatient Therapy Program [address] [place], [place] [address] [phone]  [facility]                  Crisis Services      [phone] [address]     [place], [place] [address]                 [facility]   [facility] [phone]. [address] [place], [place] [address]   [employer] [employer] [employer] of Care          [address]  [place], [place] [address]       [phone]  [facility] [address] [place], [place] [address] [phone]  [facility]    [address]    [place], [place] [address]     [phone]       Andee Poles, MD     [address]     [place] [address]     [phone]       [facility] [address] [place] [address]  [facility]     [address] [place], [place]      [phone]       [facility] Eulogio Ditch, MD [address] [place], [place] [address] [phone]  [facility]     [address]     [place], [place]  [address]     [phone]       [facility] [address] [place], [place] [address] [phone] Resources for Temporary Residential Assistance/Crisis Centers  [facility] CENT[facility]) M-F 8am-3pm   [address] [place], [place] [address]   [phone] Services include: laundry, barbering, support groups, case management, phone  & computer access, showers, AA/NA mtgs, mental health/substance abuse nurse, job skills class, disability information, VA assistance, spiritual classes, etc.   HOMELESS SHELTERS  [facility]     [facility]   [address] [place]     [phone]              [employer] (women and children)       [address] [place], [place] [address] [phone] [email or web address] for application and process Application Required  [facility] [employer] [facility]   [address]    [place] [address]     [phone]                    [facility] [address] [place], [place] [address] [phone] application appt.) Application Required  [facility] (women only)    [address]     [place], [place] [address]     [phone]      Intake starts 6pm daily Need valid ID, SSC, & Police report [employer] [address] [place], [place] [phone] Application Required  [employer] (men only)     [address]      [place], [place]     [phone]  [facility] of the [place] (Pregnant women only) [address] [place], [place] [phone]  [facility]      [address]      [place], [place] [address]     [phone]             [facility] [address] [place], [place] [phone] 90 day commitment/SA/Application process  [facility](men only)     [address]     [place],  [place]     [phone]       Check-in at [facility] [address] [place], [place] [address] [phone] Men/Women/Women and Children must be there by 7 pm  [facility] [place], [place] [phone]

## 2015-04-22 NOTE — ED Notes (Signed)
Patient is speaking with behavioral health counselor via telepsych machine; sitter is at door

## 2015-04-22 NOTE — ED Provider Notes (Signed)
Psych has seen patient and cleared for discharge. Not currently suicidal though he occasionally has passive thoughts due to drug abuse. SW has seen and given resources. Discussed strict return precautions. Stable for discharge.  Pricilla Loveless, MD 04/22/15 1140
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
## 2015-04-23 ENCOUNTER — Encounter (HOSPITAL_COMMUNITY): Payer: Self-pay

## 2015-04-23 ENCOUNTER — Inpatient Hospital Stay (HOSPITAL_COMMUNITY)
Admission: AD | Admit: 2015-04-23 | Discharge: 2015-04-27 | DRG: 885 | Disposition: A | Discharge status: Home / Self Care | Payer: Medicare Other | Attending: Psychiatry | Admitting: Psychiatry

## 2015-04-23 DIAGNOSIS — F332 Major depressive disorder, recurrent severe without psychotic features: Principal | ICD-10-CM | POA: Diagnosis present

## 2015-04-23 DIAGNOSIS — F1420 Cocaine dependence, uncomplicated: Secondary | ICD-10-CM | POA: Diagnosis present

## 2015-04-23 DIAGNOSIS — I10 Essential (primary) hypertension: Secondary | ICD-10-CM | POA: Diagnosis present

## 2015-04-23 DIAGNOSIS — F1424 Cocaine dependence with cocaine-induced mood disorder: Secondary | ICD-10-CM | POA: Insufficient documentation

## 2015-04-23 DIAGNOSIS — F1020 Alcohol dependence, uncomplicated: Secondary | ICD-10-CM | POA: Diagnosis present

## 2015-04-23 DIAGNOSIS — G4700 Insomnia, unspecified: Secondary | ICD-10-CM | POA: Diagnosis present

## 2015-04-23 DIAGNOSIS — F4310 Post-traumatic stress disorder, unspecified: Secondary | ICD-10-CM | POA: Diagnosis present

## 2015-04-23 DIAGNOSIS — R45851 Suicidal ideations: Secondary | ICD-10-CM | POA: Diagnosis present

## 2015-04-23 DIAGNOSIS — F419 Anxiety disorder, unspecified: Secondary | ICD-10-CM | POA: Diagnosis not present

## 2015-04-23 MED ORDER — BUSPIRONE HCL 5 MG PO TABS
5.0000 mg | ORAL_TABLET | Freq: Three times a day (TID) | ORAL | Status: DC
  Administered 2015-04-23 – 2015-04-24 (×5): 5 mg via ORAL
  Filled 2015-04-23 (×7): qty 1

## 2015-04-23 MED ORDER — MAGNESIUM HYDROXIDE 400 MG/5ML PO SUSP
30.0000 mL | Freq: Every day | ORAL | Status: DC | PRN
Start: 2015-04-23 — End: 2015-04-27

## 2015-04-23 MED ORDER — ACETAMINOPHEN 325 MG PO TABS: 650.0000 mg | ORAL_TABLET | Freq: Four times a day (QID) | ORAL | Status: DC | PRN

## 2015-04-23 MED ORDER — ALUM & MAG HYDROXIDE-SIMETH 200-200-20 MG/5ML PO SUSP: 30.0000 mL | ORAL | Status: DC | PRN

## 2015-04-23 MED ORDER — MIRTAZAPINE 15 MG PO TABS
45.0000 mg | ORAL_TABLET | Freq: Every day | ORAL | Status: DC
  Administered 2015-04-23 – 2015-04-26 (×4): 45 mg via ORAL
  Filled 2015-04-23 (×7): qty 3

## 2015-04-23 MED ORDER — LISINOPRIL 20 MG PO TABS
20.0000 mg | ORAL_TABLET | Freq: Every day | ORAL | Status: DC
  Administered 2015-04-23 – 2015-04-27 (×5): 20 mg via ORAL
  Filled 2015-04-23 (×7): qty 1

## 2015-04-23 NOTE — Tx Team (Signed)
Initial Interdisciplinary Treatment Plan   PATIENT STRESSORS: Medication change or noncompliance Substance abuse   PATIENT STRENGTHS: Ability for insight Capable of independent living Motivation for treatment/growth   PROBLEM LIST: Problem List/Patient Goals Date to be addressed Date deferred Reason deferred Estimated date of resolution  "substance abuse tx" 04/23/15     "social security funds" 04/23/15                                                DISCHARGE CRITERIA:  Ability to meet basic life and health needs Adequate post-discharge living arrangements Improved stabilization in mood, thinking, and/or behavior Withdrawal symptoms are absent or subacute and managed without 24-hour nursing intervention  PRELIMINARY DISCHARGE PLAN: Attend aftercare/continuing care group Attend PHP/IOP  PATIENT/FAMIILY INVOLVEMENT: This treatment plan has been presented to and reviewed with the patient, BERNIE RANSFORD, and/or family member.  The patient and family have been given the opportunity to ask questions and make suggestions.  Jermel Artley L 04/23/2015, 5:52 PM

## 2015-04-23 NOTE — Progress Notes (Signed)
Patient ID: [name], male   DOB: August 25, 1971, 44 y.o.   MRN: [id number] PER STATE REGULATIONS 482.30  THIS CHART WAS REVIEWED FOR MEDICAL NECESSITY WITH RESPECT TO THE PATIENT'S ADMISSION/DURATION OF STAY.  NEXT REVIEW DATE:04/27/15  Loura Halt, RN, BSN CASE MANAGER

## 2015-04-23 NOTE — ED Notes (Signed)
[facility] HERE FOR TRANSPORT OF THIS PT TO [facility]. IVC PAPERS GIVEN TO OFFICER POWELL. BELONGINGS X 2 TRANSFERRED WITH THIS PT. (ONE WHITE/ONE BLACK).

## 2015-04-23 NOTE — BH Assessment (Signed)
Seeking inpt placement. Sent referrals to: , [facility], [facility], [facility], [facility],   [place], [place] Triage Specialist 04/23/2015 2:07 AM

## 2015-04-23 NOTE — ED Notes (Signed)
[facility] called to ask questions on this bed. [facility] says they will call back to let us if they accept him or not.

## 2015-04-23 NOTE — Progress Notes (Signed)
D. Pt brought in by [facility] with IVC papers from [facility] ED. During admission, pt was calm and cooperative. Pt presented with depressed mood, flat affect and a sense of hopelessness. Pt reported being homeless and this had made his condition to worsen. A. Pt was offered encouragement and support and was receptive. Pt Denies SI/HI at this time. R. Pt placed on Q observation for safety. Safety maintained, will continue to monitor.
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
## 2015-04-23 NOTE — ED Provider Notes (Signed)
CSN: [id number]     Arrival date & time 04/22/15  2140 History   First MD Initiated Contact with Patient 04/22/15 2149     Chief Complaint  Patient presents with  . IVC    . Suicidal     (Consider location/radiation/quality/duration/timing/severity/associated sxs/prior Treatment) HPI Comments: PT sent here from [facility] with IVC. He reports having suicidal thoughts, bad depression. Pt also has hx of alcoholism. He was just assessed at [facility] and discharged by psych , and then again  By EDP. Vesta Mixer has ivcd the patient and sent him here.   The history is provided by the patient.    Past Medical History  Diagnosis Date  . Anxiety   . Bipolar 1 disorder   . Asthma   . Hypertension   . Depression   . PTSD (post-traumatic stress disorder)   . Heart palpitations     with severe anxiety   Past Surgical History  Procedure Laterality Date  . No past surgeries     No family history on file. Social History  Substance Use Topics  . Smoking status: Never Smoker   . Smokeless tobacco: Never Used  . Alcohol Use: 3.6 oz/week    1 Glasses of wine, 5 Cans of beer, 0 Shots of liquor, 0 Standard drinks or equivalent per week     Comment: binger on etoh and cocaine    Review of Systems  Constitutional: Negative for activity change and appetite change.  Respiratory: Negative for cough and shortness of breath.   Cardiovascular: Negative for chest pain.  Gastrointestinal: Negative for abdominal pain.  Genitourinary: Negative for dysuria.  Psychiatric/Behavioral: Positive for suicidal ideas, behavioral problems and sleep disturbance. Negative for hallucinations. The patient is not nervous/anxious.       Allergies  Review of patient's allergies indicates no known allergies.  Home Medications   Prior to Admission medications   Medication Sig Start Date End Date Taking? Authorizing Provider  acamprosate (CAMPRAL) 333 MG tablet Take 2 tablets (666 mg total) by mouth 3 (three) times  daily. 04/20/15  Yes Adonis Brook, NP  busPIRone (BUSPAR) 5 MG tablet Take 1 tablet (5 mg total) by mouth 3 (three) times daily. 04/20/15  Yes Adonis Brook, NP  lisinopril (PRINIVIL,ZESTRIL) 20 MG tablet Take 20 mg by mouth daily.   Yes Historical Provider, MD  mirtazapine (REMERON) 45 MG tablet Take 1 tablet (45 mg total) by mouth at bedtime. 04/20/15  Yes Adonis Brook, NP   BP 132/75 mmHg  Pulse 78  Temp(Src) 98.7 F (37.1 C) (Oral)  Resp 18  SpO2 98% Physical Exam  Constitutional: He is oriented to person, place, and time. He appears well-developed.  HENT:  Head: Atraumatic.  Neck: Neck supple.  Cardiovascular: Normal rate.   Pulmonary/Chest: Effort normal.  Neurological: He is alert and oriented to person, place, and time.  Skin: Skin is warm.  Psychiatric:  Flat affect  Nursing note and vitals reviewed.   ED Course  Procedures (including critical care time) Labs Review Labs Reviewed  URINE RAPID DRUG SCREEN, HOSP PERFORMED - Abnormal; Notable for the following:    Cocaine POSITIVE (*)    Benzodiazepines POSITIVE (*)    All other components within normal limits  COMPREHENSIVE METABOLIC PANEL  ETHANOL  CBC    Imaging Review No results found. I have personally reviewed and evaluated these images and lab results as part of my medical decision-making.   EKG Interpretation None      MDM   Final diagnoses:  Severe recurrent major depression without psychotic features  Alcohol use disorder, severe, dependence  Cocaine dependence without complication  Post traumatic stress disorder (PTSD)    Psych consulted as he was sent [facility].     Derwood Kaplan, MD 04/23/15 [phone]

## 2015-04-23 NOTE — BH Assessment (Signed)
Reviewed ED notes prior to initiating assessment. Pt was seen earlier on 04-22-15 and discharged. He was discharged from [facility] on 04-20-15. Pt was sent back to [facility] tonight under IVC from [facility] due to SI and not being able to contract for safety.   Requested pt be placed in room for privacy prior to assessment. Per Lorina Rabon pt will moved to room 25 for assessment.   Assessment to commence shortly.    Clista Bernhardt, [facility] Triage Specialist 04/23/2015 12:06 AM

## 2015-04-24 DIAGNOSIS — R45851 Suicidal ideations: Secondary | ICD-10-CM

## 2015-04-24 DIAGNOSIS — F4310 Post-traumatic stress disorder, unspecified: Secondary | ICD-10-CM

## 2015-04-24 DIAGNOSIS — F1424 Cocaine dependence with cocaine-induced mood disorder: Secondary | ICD-10-CM

## 2015-04-24 DIAGNOSIS — F332 Major depressive disorder, recurrent severe without psychotic features: Principal | ICD-10-CM

## 2015-04-24 DIAGNOSIS — F1020 Alcohol dependence, uncomplicated: Secondary | ICD-10-CM

## 2015-04-24 MED ORDER — HYDROXYZINE HCL 50 MG PO TABS
50.0000 mg | ORAL_TABLET | Freq: Once | ORAL | Status: AC
  Administered 2015-04-24: 50 mg via ORAL
  Filled 2015-04-24 (×2): qty 1

## 2015-04-24 MED ORDER — BUSPIRONE HCL 10 MG PO TABS
10.0000 mg | ORAL_TABLET | Freq: Three times a day (TID) | ORAL | Status: DC
  Administered 2015-04-25 – 2015-04-27 (×7): 10 mg via ORAL
  Filled 2015-04-24 (×13): qty 1

## 2015-04-24 MED ORDER — LORAZEPAM 1 MG PO TABS
1.0000 mg | ORAL_TABLET | Freq: Four times a day (QID) | ORAL | Status: DC | PRN
  Administered 2015-04-24 – 2015-04-25 (×2): 1 mg via ORAL
  Filled 2015-04-24 (×2): qty 1
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
## 2015-04-24 NOTE — [facility] Group Notes (Signed)
[facility] LCSW Group Therapy  04/24/2015 1:27 PM  Type of Therapy:  Group Therapy  Participation Level:  Active  Participation Quality:  Attentive  Affect:  Depressed and Flat  Cognitive:  Oriented  Insight:  Improving  Engagement in Therapy:  Improving  Modes of Intervention:  Discussion, Education, Exploration, Problem-solving, Rapport Building, Socialization and Support  Summary of Progress/Problems: MHA Speaker came to talk about his personal journey with substance abuse and addiction. The pt processed ways by which to relate to the speaker. MHA speaker provided handouts and educational information pertaining to groups and services offered by the [facility].   Smart, Kirsta Probert LCSWA 04/24/2015, 1:27 PM
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
## 2015-04-24 NOTE — Progress Notes (Signed)
Patient did not attend group tonight. Patient was remained in room.

## 2015-04-24 NOTE — Progress Notes (Signed)
Recreation Therapy Notes  Animal-Assisted Activity (AAA) Program Checklist/Progress Notes Patient Eligibility Criteria Checklist & Daily Group note for Rec Tx Intervention  Date: 08.23.2016 Time: 2:45pm Location: [address] Dayroom    AAA/T Program Assumption of Risk Form signed by Patient/ or Parent Legal Guardian yes  Patient is free of allergies or sever asthma yes  Patient reports no fear of animals yes  Patient reports no history of cruelty to animals yes  Patient understands his/her participation is voluntary yes  Patient washes hands before animal contact yes  Patient washes hands after animal contact yes  Behavioral Response: Attentive, Appropriate   Education: Hand Washing, Appropriate Animal Interaction   Education Outcome: Acknowledges understanding   Clinical Observations/Feedback: Patient actively engaged in session, petting therapy dog appropriately. Additionally patient engaged appropriately with peers and handler.   Tiena Manansala L Lenisha Lacap, LRT/CTRS  [name] L 04/24/2015 3:05 PM 
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
## 2015-04-24 NOTE — [facility] Group Notes (Signed)
[facility] Group Notes:  (Nursing/MHT/Case Management/Adjunct)  Date:  04/24/2015  Time: 0900 am  Type of Therapy:  Psychoeducational Skills  Participation Level:  Active  Participation Quality:  Appropriate and Attentive  Affect:  Anxious  Cognitive:  Lacking  Insight:  Lacking  Engagement in Group:  Engaged  Modes of Intervention:  Support  Summary of Progress/Problems: Patient was disappointed.  He states, "I know I just left here, but I messed up."    [name] Mon 04/24/2015, 9:28 AM

## 2015-04-24 NOTE — [facility] Suicide Risk Assessment (Signed)
[facility] Admission Suicide Risk Assessment   Nursing information obtained from:  Patient Demographic factors:  Male, Low socioeconomic status, Unemployed Current Mental Status:  Suicidal ideation indicated by patient, Self-harm thoughts Loss Factors:  Financial problems / change in socioeconomic status Historical Factors:  Prior suicide attempts Risk Reduction Factors:  Positive coping skills or problem solving skills Total Time spent with patient: 45 minutes Principal Problem: <principal problem not specified> Diagnosis:   Patient Active Problem List   Diagnosis Date Noted  . 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 Severe recurrent major depression without psychotic features [F33.2] 04/13/2015  . Alcohol use disorder, severe, dependence [F10.20] 04/12/2015  . Cocaine dependence [F14.20] 04/07/2013  . Hypertension [I10] 09/08/2011  . Post traumatic stress disorder (PTSD) [F43.10] 09/08/2011     Continued Clinical Symptoms:  Alcohol Use Disorder Identification Test Final Score (AUDIT): 31 The "Alcohol Use Disorders Identification Test", Guidelines for Use in Primary Care, Second Edition.  World Science writer [facility]). Score between 0-7:  no or low risk or alcohol related problems. Score between 8-15:  moderate risk of alcohol related problems. Score between 16-19:  high risk of alcohol related problems. Score 20 or above:  warrants further diagnostic evaluation for alcohol dependence and treatment.   CLINICAL FACTORS:   Depression:   Comorbid alcohol abuse/dependence Alcohol/Substance Abuse/Dependencies   Psychiatric Specialty Exam: Physical Exam  ROS  Blood pressure 125/85, pulse 85, temperature 98.1 F (36.7 C), temperature source Oral, resp. rate 14, height 5' 5.5" (1.664 m), weight 114.193 kg (251 lb 12 oz), SpO2 97 %.Body mass index is 41.24 kg/(m^2).   COGNITIVE FEATURES THAT CONTRIBUTE TO RISK:  Closed-mindedness, Polarized thinking and Thought  constriction (tunnel vision)    SUICIDE RISK:   Moderate:  Frequent suicidal ideation with limited intensity, and duration, some specificity in terms of plans, no associated intent, good self-control, limited dysphoria/symptomatology, some risk factors present, and identifiable protective factors, including available and accessible social support.  PLAN OF CARE: Supportive approach/coping skills                               Alcohol cocaine dependence; identify detox needs/work a relapse prevention plan                               Depression; resume the Remeron 45/optimize response                               Anxiety; increase the Buspar to 10 mg TID                               Clarify situation with his SSI, explore placement options  Medical Decision Making:  Review of Psycho-Social Stressors (1), Review or order clinical lab tests (1), Review of Medication Regimen & Side Effects (2) and Review of New Medication or Change in Dosage (2)  I certify that inpatient services furnished can reasonably be expected to improve the patient's condition.   Daemon Dowty A 04/24/2015, 7:38 PM
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
## 2015-04-25 MED ORDER — CHLORDIAZEPOXIDE HCL 25 MG PO CAPS
25.0000 mg | ORAL_CAPSULE | Freq: Four times a day (QID) | ORAL | Status: DC | PRN
  Administered 2015-04-25 – 2015-04-27 (×6): 25 mg via ORAL
  Filled 2015-04-25 (×9): qty 1

## 2015-04-25 MED ORDER — PRAZOSIN HCL 1 MG PO CAPS
1.0000 mg | ORAL_CAPSULE | ORAL | Status: AC
  Administered 2015-04-25: 1 mg via ORAL
  Filled 2015-04-25: qty 1

## 2015-04-25 MED ORDER — PRAZOSIN HCL 1 MG PO CAPS
1.0000 mg | ORAL_CAPSULE | Freq: Every day | ORAL | Status: DC
Start: 2015-04-25 — End: 2015-04-27
  Administered 2015-04-25 – 2015-04-26 (×2): 1 mg via ORAL
  Filled 2015-04-25 (×4): qty 1

## 2015-04-25 NOTE — Progress Notes (Signed)
At approx 0030, pt was up at the NS stating that he had a nightmare that his heart had stopped and now his heart was "racing".  Writer attempted to assure pt that he had only experienced a dream, but he insisted that his BP be checked.  His BP and heart rate were up and the NP was informed.  Pt was ordered Prazosin 1 mg now dose and med was administered.  Pt returned to his room and bed.

## 2015-04-25 NOTE — [facility] Group Notes (Signed)
[facility] LCSW Group Therapy 04/25/2015  1:15 PM Type of Therapy: Group Therapy Participation Level: Minimal  Participation Quality: Minimal  Affect: Agitated  Cognitive: Alert and Oriented  Insight: Developing/Improving and Engaged  Engagement in Therapy: Developing/Improving and Engaged  Modes of Intervention: Clarification, Confrontation, Discussion, Education, Exploration, Limit-setting, Orientation, Problem-solving, Rapport Building, Dance movement psychotherapist, Socialization and Support  Summary of Progress/Problems: The topic for group today was emotional regulation. This group focused on both positive and negative emotion identification and allowed group members to process ways to identify feelings, regulate negative emotions, and find healthy ways to manage internal/external emotions. Group members were asked to reflect on a time when their reaction to an emotion led to a negative outcome and explored how alternative responses using emotion regulation would have benefited them. Group members were also asked to discuss a time when emotion regulation was utilized when a negative emotion was experienced. Patient came to group late and interrupted discussion to request to speak with administration staff. CSW informed patient that he could request to speak with the A/C after group discussion. Patient left group early and participated minimally in discussion.  Samuella Bruin, MSW, [employer] Clinical Social Worker [facility] [phone]

## 2015-04-25 NOTE — Progress Notes (Signed)
D: Pt stated that a mht "made him mad" earlier in the day. Stated that someone made the comment about "why men have to shave all the time". However, pt was allowed to shave. Pt stated he didn't want to discuss the circumstances surrounding his readmission.   A:  Support and encouragement was offered. 15 min checks continued for safety.  R: Pt remains safe.

## 2015-04-25 NOTE — Progress Notes (Signed)
D- Patient is in an anxious and depressed mood with a flat/sad affect.  Patient is observed in the milieu with minmal interaction with others.  Denies SI, HI, AVH, and pain.  Patient has c/o anxiety.  Patient reports that he is grieving the loss of his mother.  Mother died in 11-07-22 and blames his father for her death.  Patient also states that he has siblings who are "well off and doing pretty good" but they do not have time for him which further leads to his depression.  On patient's self-inventory, he rates his depression, feelings of hopelessness, and anxiety "10" with 10 being the worst.  When patient is asked about these categories verbally, he states "I'm good".  Patient refused to go to lunch due to increased anxiety but did attend dinner.   A- PRN and scheduled medications administered to patient, per MD orders. Support and encouragement provided.  Routine safety checks conducted every 15 minutes.  Patient informed to notify staff with problems or concerns. R- No adverse drug reactions noted. Patient contracts for safety at this time. Patient compliant with medications and treatment plan. Patient receptive, calm, and cooperative.  Patient remains safe at this time.

## 2015-04-25 NOTE — Progress Notes (Signed)
Pt continues to be sad and depressed.  He is feeling guilty about having to return to [facility] in such a short time.  He wants the CSW to help him find someone that he can trust to be his payee.  He c/o of nervousness and anxiety.  He has been observed in the dayroom watching TV, but tends to isolate himself and has minimal interaction with other patients on the unit.  He denies SI at this time, but says suicidal thoughts come and go.  He denies HI/AVH.  Pt seems focused on his Buspar dosage and again mentions it tonight, thinking that he is only taking 5 mg.  Writer tried to explain to pt that he was ordered the same dosage that he was taking at his last discharge which was 5 mg TID.  He still did not seem to understand.  When writer again checked the [facility], the MD had increased his dosage to 10 mg TID and the pt was informed of this change.  Pt was also given a prn dose of Ativan 1 mg at 2027 for anxiety.  Pt is still unsure of his discharge plans.  Support and encouragement offered.  Safety maintained with q15 minute checks.
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
## 2015-04-25 NOTE — Progress Notes (Signed)
Patient did not attend N/A group tonight.  

## 2015-04-25 NOTE — [facility] Group Notes (Signed)
   [facility] LCSW Aftercare Discharge Planning Group Note  04/25/2015  8:45 AM   Participation Quality: Alert, Appropriate and Oriented  Mood/Affect: Depressed and Flat  Depression Rating: 10  Anxiety Rating: 10  Thoughts of Suicide: Pt denies SI/HI  Will you contract for safety? Yes  Current AVH: Pt denies  Plan for Discharge/Comments: Pt attended discharge planning group and actively participated in group. CSW provided pt with today's workbook. Patient reports not feeling well due to feeling that staff were not attentive to his needs last night. CSW provided patient with information on [address] and [employer] of [place] information at this request.  [employer]: CSW continuing to assess.  Supports: No supports mentioned at this time  Samuella Bruin, MSW, [employer] Clinical Social Worker [facility] [phone]

## 2015-04-26 MED ORDER — CHLORDIAZEPOXIDE HCL 25 MG PO CAPS
25.0000 mg | ORAL_CAPSULE | Freq: Once | ORAL | Status: AC
  Administered 2015-04-26: 25 mg via ORAL

## 2015-04-26 MED ORDER — ZOLPIDEM TARTRATE 10 MG PO TABS
10.0000 mg | ORAL_TABLET | Freq: Every evening | ORAL | Status: DC | PRN
  Administered 2015-04-26: 10 mg via ORAL
  Filled 2015-04-26: qty 1

## 2015-04-26 NOTE — [facility] Group Notes (Signed)
[facility] Group Notes: (Nursing/MHT/Case Management/Adjunct)  Date: 04/26/2015  Time: 6:06 PM  Type of Therapy: Nurse Education  Participation Level: Did Not Attend  Participation Quality: did not attend  Affect: did not attend   Cognitive: did not attend   Insight: None  Engagement in Group: None  Modes of Intervention: Discussion and Education  Summary of Progress/Problems: The topic of the day is Healthy coping skills. Patient was invited to group but did not attend.

## 2015-04-26 NOTE — Progress Notes (Signed)
[facility] MD Progress Note  04/26/2015 6:22 PM [name]  MRN:  [id number] Subjective:  States she does not know why he is having such a hard time dealing with the death of his mother. States he has been told by his siblings to move on. States he tells them they did not see her dying. He states she still has her face in his mind. He is still having issue with waking up middle of the night with anxiety. He states that his brother told him he plans to come and visit him. He hopes he does as he feels he needs his support Principal Problem: Recurrent major depression-severe Diagnosis:   Patient Active Problem List   Diagnosis Date Noted  . 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 Severe recurrent major depression without psychotic features [F33.2] 04/13/2015  . Alcohol use disorder, severe, dependence [F10.20] 04/12/2015  . Cocaine dependence [F14.20] 04/07/2013  . Hypertension [I10] 09/08/2011  . Post traumatic stress disorder (PTSD) [F43.10] 09/08/2011   Total Time spent with patient: 30 minutes   Past Medical History:  Past Medical History  Diagnosis Date  . Anxiety   . Bipolar 1 disorder   . Asthma   . Hypertension   . Depression   . PTSD (post-traumatic stress disorder)   . Heart palpitations     with severe anxiety    Past Surgical History  Procedure Laterality Date  . No past surgeries     Family History: History reviewed. No pertinent family history. Social History:  History  Alcohol Use  . 3.6 oz/week  . 1 Glasses of wine, 5 Cans of beer, 0 Shots of liquor, 0 Standard drinks or equivalent per week    Comment: binger on etoh and cocaine     History  Drug Use  . 1.00 per week  . Special: Marijuana, Cocaine    Comment: last used cocaine today    Social History   Social History  . Marital Status: Single    Spouse Name: N/A  . Number of Children: N/A  . Years of Education: N/A   Social History Main Topics  . Smoking status: Never Smoker   .  Smokeless tobacco: Never Used  . Alcohol Use: 3.6 oz/week    1 Glasses of wine, 5 Cans of beer, 0 Shots of liquor, 0 Standard drinks or equivalent per week     Comment: binger on etoh and cocaine  . Drug Use: 1.00 per week    Special: Marijuana, Cocaine     Comment: last used cocaine today  . Sexual Activity: No   Other Topics Concern  . None   Social History Narrative   Additional History:    Sleep: Fair  Appetite:  Fair   Assessment:   Musculoskeletal: Strength & Muscle Tone: within normal limits Gait & Station: normal Patient leans: normal   Psychiatric Specialty Exam: Physical Exam  Review of Systems  Constitutional: Negative.   HENT: Negative.   Eyes: Negative.   Respiratory: Negative.   Cardiovascular: Negative.   Gastrointestinal: Negative.   Genitourinary: Negative.   Musculoskeletal: Negative.   Skin: Negative.   Neurological: Negative.   Endo/Heme/Allergies: Negative.   Psychiatric/Behavioral: Positive for depression and substance abuse. The patient is nervous/anxious.     Blood pressure 156/116, pulse 89, temperature 98.4 F (36.9 C), temperature source Oral, resp. rate 20, height 5' 5.5" (1.664 m), weight 114.193 kg (251 lb 12 oz), SpO2 97 %.Body mass index is 41.24 kg/(m^2).  General Appearance: Fairly  Groomed  Patent attorney::  Fair  Speech:  Clear and Coherent, Slow and decreased volume  Volume:  Decreased  Mood:  Depressed  Affect:  Restricted and Tearful  Thought Process:  Coherent and Goal Directed  Orientation:  Full (Time, Place, and Person)  Thought Content:  symptoms events worries concerns  Suicidal Thoughts:  No  Homicidal Thoughts:  No  Memory:  Immediate;   Fair Recent;   Fair Remote;   Fair  Judgement:  Fair  Insight:  Present and Shallow  Psychomotor Activity:  Decreased  Concentration:  Fair  Recall:  [employer] of Knowledge:Fair  Language: Fair  Akathisia:  No  Handed:  Right  AIMS (if indicated):     Assets:  Desire  for Improvement  ADL's:  Intact  Cognition: WNL  Sleep:        Current Medications: Current Facility-Administered Medications  Medication Dose Route Frequency Provider Last Rate Last Dose  . acetaminophen (TYLENOL) tablet 650 mg  650 mg Oral Q6H PRN Charm Rings, NP      . alum & mag hydroxide-simeth (MAALOX/MYLANTA) 200-200-20 MG/5ML suspension 30 mL  30 mL Oral Q4H PRN Charm Rings, NP      . busPIRone (BUSPAR) tablet 10 mg  10 mg Oral TID Rachael Fee, MD   10 mg at 04/26/15 1701  . chlordiazePOXIDE (LIBRIUM) capsule 25 mg  25 mg Oral Q6H PRN Rachael Fee, MD   25 mg at 04/26/15 1702  . lisinopril (PRINIVIL,ZESTRIL) tablet 20 mg  20 mg Oral Daily Charm Rings, NP   20 mg at 04/26/15 1610  . magnesium hydroxide (MILK OF MAGNESIA) suspension 30 mL  30 mL Oral Daily PRN Charm Rings, NP      . mirtazapine (REMERON) tablet 45 mg  45 mg Oral QHS Charm Rings, NP   45 mg at 04/25/15 2304  . prazosin (MINIPRESS) capsule 1 mg  1 mg Oral QHS Rachael Fee, MD   1 mg at 04/25/15 2304    Lab Results: No results found for this or any previous visit (from the past 48 hour(s)).  Physical Findings: AIMS: Facial and Oral Movements Muscles of Facial Expression: None, normal Lips and Perioral Area: None, normal Jaw: None, normal Tongue: None, normal,Extremity Movements Upper (arms, wrists, hands, fingers): None, normal Lower (legs, knees, ankles, toes): None, normal, Trunk Movements Neck, shoulders, hips: None, normal, Overall Severity Severity of abnormal movements (highest score from questions above): None, normal Incapacitation due to abnormal movements: None, normal Patient's awareness of abnormal movements (rate only patient's report): No Awareness, Dental Status Current problems with teeth and/or dentures?: No Does patient usually wear dentures?: No  CIWA:  CIWA-Ar Total: 2 COWS:  COWS Total Score: 0  Treatment Plan Summary: Daily contact with patient to assess and evaluate  symptoms and progress in treatment and Medication management Supportive approach/coping skills Alcohol cocaine dependence; work a relapse prevention plan Depression; continue the Remeron at 23 Anxiety; continue the Buspar Will continue to provide the Librium Will encourage to grief, normalized the grief process he is going trough Explore placement options  Medical Decision Making:  Review of Psycho-Social Stressors (1), Review of Medication Regimen & Side Effects (2) and Review of New Medication or Change in Dosage (2)     Mohammed Mcandrew A 04/26/2015, 6:22 PM

## 2015-04-26 NOTE — [facility] Group Notes (Signed)
[facility] LCSW Group Therapy 04/26/2015 1:15 PM Type of Therapy: Group Therapy Participation Level: Active  Participation Quality: Attentive, Sharing and Supportive  Affect: Appropriate  Cognitive: Alert and Oriented  Insight: Developing/Improving and Engaged  Engagement in Therapy: Developing/Improving and Engaged  Modes of Intervention: Activity, Clarification, Confrontation, Discussion, Education, Exploration, Limit-setting, Orientation, Problem-solving, Rapport Building, Reality Testing, Socialization and Support  Summary of Progress/Problems: Patient was attentive and engaged with speaker from [employer]. Patient was attentive to speaker while they shared their story of dealing with mental health and overcoming it. Patient expressed interest in their programs and services and received information on their agency. Patient processed ways they can relate to the speaker.   Ellyce Lafevers, MSW, LCSWA Clinical Social Worker [facility] [phone]   

## 2015-04-26 NOTE — [facility] Group Notes (Signed)
Adult Psychoeducational Group Note  Date:  04/26/2015 Time:  10:36 PM  Group Topic/Focus:  Wrap-Up Group:   The focus of this group is to help patients review their daily goal of treatment and discuss progress on daily workbooks.  Participation Level:  Did Not Attend  Participation Quality:  Did not attend  Affect:  Did not attend  Cognitive:  Did not attend  Insight: None  Engagement in Group:  Did not attend  Modes of Intervention:  Did not attend  Additional Comments:  Patient did not attend wrap up group tonight.   Mavis Gravelle L Roben Schliep 04/26/2015, 10:36 PM
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
## 2015-04-26 NOTE — Progress Notes (Signed)
Recreation Therapy Notes  Date: 08.24.2016 Time: 9:30am Location: [address] Group Room   Group Topic: Stress Management  Goal Area(s) Addresses:  Patient will actively participate in stress management techniques presented during session.   Behavioral Response: Engaged, Appropriate   Intervention: Stress management techniques  Activity :  Deep Breathing and Progressive Muscle Relaxation. LRT provided instruction and demonstration on practice of Progressive Muscle Relaxation. Technique was coupled with deep breathing.   Education:  Stress Management, Discharge Planning.   Education Outcome: Acknowledges edcuation  Clinical Observations/Feedback: Patient actively engaged in technique introduced during session, expressing no difficulty and demonstrating ability to practice independently post d/c.    Marykay Lex Deano Tomaszewski, LRT/CTRS  Josue Kass L 04/26/2015 9:12 AM

## 2015-04-26 NOTE — [facility] Suicide Risk Assessment (Signed)
[facility] INPATIENT:  Family/Significant Other Suicide Prevention Education  Suicide Prevention Education:  Contact Attempts: sister [name] [phone], (name of family member/significant other) has been identified by the patient as the family member/significant other with whom the patient will be residing, and identified as the person(s) who will aid the patient in the event of a mental health crisis.  With written consent from the patient, two attempts were made to provide suicide prevention education, prior to and/or following the patient's discharge.  We were unsuccessful in providing suicide prevention education.  A suicide education pamphlet was given to the patient to share with family/significant other.  Date and time of first attempt: 04/26/15 at 12:55pm Date and time of second attempt: 04/27/15 at 2:35 pm  [name], [place] 04/26/2015, 12:54 PM

## 2015-04-27 DIAGNOSIS — F1424 Cocaine dependence with cocaine-induced mood disorder: Secondary | ICD-10-CM | POA: Insufficient documentation

## 2015-04-27 DIAGNOSIS — F332 Major depressive disorder, recurrent severe without psychotic features: Secondary | ICD-10-CM | POA: Insufficient documentation

## 2015-04-27 MED ORDER — MIRTAZAPINE 45 MG PO TABS: 45.0000 mg | ORAL_TABLET | Freq: Every day | ORAL | Status: DC

## 2015-04-27 MED ORDER — BUSPIRONE HCL 10 MG PO TABS: 10.0000 mg | ORAL_TABLET | Freq: Three times a day (TID) | ORAL | Status: DC

## 2015-04-27 MED ORDER — PRAZOSIN HCL 1 MG PO CAPS: 1.0000 mg | ORAL_CAPSULE | Freq: Every day | ORAL | Status: DC

## 2015-04-27 MED ORDER — LISINOPRIL 20 MG PO TABS: 20.0000 mg | ORAL_TABLET | Freq: Every day | ORAL | Status: DC

## 2015-04-27 MED ORDER — ZOLPIDEM TARTRATE 10 MG PO TABS: 10.0000 mg | ORAL_TABLET | Freq: Every evening | ORAL | Status: DC | PRN

## 2015-04-27 NOTE — BHH Group Notes (Signed)
   [facility] LCSW Aftercare Discharge Planning Group Note  04/27/2015  8:45 AM   Participation Quality: Alert, Appropriate and Oriented  Mood/Affect: Agitated  Depression Rating: 5  Anxiety Rating: 8  Thoughts of Suicide: Pt denies SI/HI  Will you contract for safety? Yes  Current AVH: Pt denies  Plan for Discharge/Comments: Pt attended discharge planning group and actively participated in group. CSW provided pt with today's workbook. Patient is unclear about his discharge plans. Mentioned that he needs to contact social security administration regarding his disability income. Patient agitated and complained about staff and not being able to shave when he wants to. CSW agreeable to relay concerns to [facility] at patient's request.   Transportation Means: CSW continuing to assess  Supports: No supports mentioned at this time  Samuella Bruin, MSW, [employer] Clinical Social Worker [facility] [phone]

## 2015-04-27 NOTE — Progress Notes (Signed)
Pt presents with flat, irritable affect. States he is not having a good day. States he had been waiting on prn for a long time and had not received it. PRN given with good relief. Pt states he did not sleep good, he was having bad dreams and his mind would not rest. Pt also states he requested to shave and no one would help him. Med tech assisted with shave this am. Pt continue to voice complaints of stay here.  Pt denies SI, HI, but endorses AVH. Encouragement and support offered. Safety maintained on unit.

## 2015-04-27 NOTE — Progress Notes (Signed)
Pt discharged home. DC instructions provided and explained. Medications reviewed. Rx given. All questions answered. Pt stable at discharge. Pt hopeful, states brother is going to put him up in a room and make good use of his SSI check. Pt denies SI, HI, AVH. Belongings returned to pt. Discharge supply of med given to patient.
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
## 2015-04-27 NOTE — Progress Notes (Signed)
Patient ID: [name], male   DOB: 12-12-1970, 44 y.o.   MRN: [id number] PER STATE REGULATIONS 482.30  THIS CHART WAS REVIEWED FOR MEDICAL NECESSITY WITH RESPECT TO THE PATIENT'S ADMISSION/ DURATION OF STAY.  NEXT REVIEW DATE:   05/01/2015  Willa Rough, RN, BSN CASE MANAGER

## 2015-04-27 NOTE — Clinical Social Work Note (Signed)
Taxi voucher provided for patient to get to social security administration by 4pm in order to switch his payee. Listing of local shelters provided.  Samuella Bruin, MSW, [employer] Clinical Social Worker [facility] [phone]
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
## 2015-04-27 NOTE — Progress Notes (Signed)
   D: When asked about his day pt stated, "not good." Stated, "If I talk about it I'll have another panic attack". Pt stated he issues regarding the staff. Also stated, "my brother said he was gonna visit but he didn't come".  Pt continues to complain about his care at [facility]. Stated, "I will never come back here again". Stating that he believes "favortism" is shown to some of the patients.   A:  Support and encouragement was offered. 15 min checks continued for safety.  R: Pt remains safe.

## 2015-04-27 NOTE — Plan of Care (Signed)
Problem: Alteration in mood Goal: STG-Patient is able to discuss feelings and issues (Patient is able to discuss feelings and issues leading to depression)  Outcome: Progressing Pt discussed feelings. Discussed dreams and insomnia from last night Goal: STG-Patient reports thoughts of self-harm to staff Outcome: Progressing Denies SI. No self harm reported or observed

## 2015-04-27 NOTE — Progress Notes (Signed)
  [facility] Adult Case Management Discharge Plan :  Will you be returning to the same living situation after discharge:  Yes, patient plans to return to previous living situation At discharge, do you have transportation home?: Yes,  patient provided with taxi voucher Do you have the ability to pay for your medications: Yes,  patient will be provided with medication samples and prescriptions   Release of information consent forms completed and in the chart;  Patient's signature needed at discharge.  Patient to Follow up at: Follow-up Information    Follow up with [facility].   Specialty:  Behavioral Health   Why:  Walk in clinic Monday-Friday between 8am to 3pm for assessment for therapy and medication management services.   Contact information:   [address] [place] [address] [phone]       Patient denies SI/HI: Yes,  denies    Safety Planning and Suicide Prevention discussed: Yes,  with patient  Have you used any form of tobacco in the last 30 days? (Cigarettes, Smokeless Tobacco, Cigars, and/or Pipes): No  Has patient been referred to the Quitline?: N/A patient is not a smoker  Alvie Speltz, [place] 04/27/2015, 2:43 PM

## 2015-04-28 ENCOUNTER — Emergency Department (HOSPITAL_COMMUNITY)
Admission: EM | Admit: 2015-04-28 | Discharge: 2015-04-28 | Disposition: A | Discharge status: Home / Self Care | Payer: Medicare Other | Attending: Emergency Medicine | Admitting: Emergency Medicine

## 2015-04-28 ENCOUNTER — Encounter (HOSPITAL_COMMUNITY): Payer: Self-pay | Admitting: Emergency Medicine

## 2015-04-28 DIAGNOSIS — F1324 Sedative, hypnotic or anxiolytic dependence with sedative, hypnotic or anxiolytic-induced mood disorder: Secondary | ICD-10-CM | POA: Diagnosis not present

## 2015-04-28 DIAGNOSIS — F1424 Cocaine dependence with cocaine-induced mood disorder: Secondary | ICD-10-CM | POA: Insufficient documentation

## 2015-04-28 DIAGNOSIS — F1024 Alcohol dependence with alcohol-induced mood disorder: Secondary | ICD-10-CM | POA: Insufficient documentation

## 2015-04-28 DIAGNOSIS — I10 Essential (primary) hypertension: Secondary | ICD-10-CM | POA: Insufficient documentation

## 2015-04-28 DIAGNOSIS — J45909 Unspecified asthma, uncomplicated: Secondary | ICD-10-CM | POA: Insufficient documentation

## 2015-04-28 DIAGNOSIS — F1020 Alcohol dependence, uncomplicated: Secondary | ICD-10-CM | POA: Diagnosis present

## 2015-04-28 DIAGNOSIS — F1994 Other psychoactive substance use, unspecified with psychoactive substance-induced mood disorder: Secondary | ICD-10-CM | POA: Diagnosis present

## 2015-04-28 DIAGNOSIS — F419 Anxiety disorder, unspecified: Secondary | ICD-10-CM | POA: Diagnosis not present

## 2015-04-28 DIAGNOSIS — Z008 Encounter for other general examination: Secondary | ICD-10-CM | POA: Diagnosis present

## 2015-04-28 DIAGNOSIS — F4310 Post-traumatic stress disorder, unspecified: Secondary | ICD-10-CM | POA: Diagnosis not present

## 2015-04-28 DIAGNOSIS — R451 Restlessness and agitation: Secondary | ICD-10-CM

## 2015-04-28 DIAGNOSIS — Z79899 Other long term (current) drug therapy: Secondary | ICD-10-CM | POA: Diagnosis not present

## 2015-04-28 DIAGNOSIS — F319 Bipolar disorder, unspecified: Secondary | ICD-10-CM | POA: Diagnosis not present

## 2015-04-28 LAB — CBC
HEMATOCRIT: 46.2 % (ref 39.0–52.0)
Hemoglobin: 15.1 g/dL (ref 13.0–17.0)
MCH: 27.1 pg (ref 26.0–34.0)
MCHC: 32.7 g/dL (ref 30.0–36.0)
MCV: 82.9 fL (ref 78.0–100.0)
Platelets: 271 10*3/uL (ref 150–400)
RBC: 5.57 MIL/uL (ref 4.22–5.81)
RDW: 14.3 % (ref 11.5–15.5)
WBC: 9.1 10*3/uL (ref 4.0–10.5)

## 2015-04-28 LAB — COMPREHENSIVE METABOLIC PANEL
ALBUMIN: 4.2 g/dL (ref 3.5–5.0)
ALK PHOS: 100 U/L (ref 38–126)
ALT: 13 U/L — AB (ref 17–63)
AST: 18 U/L (ref 15–41)
Anion gap: 9 (ref 5–15)
BILIRUBIN TOTAL: 0.5 mg/dL (ref 0.3–1.2)
BUN: 14 mg/dL (ref 6–20)
CO2: 21 mmol/L — ABNORMAL LOW (ref 22–32)
CREATININE: 0.99 mg/dL (ref 0.61–1.24)
Calcium: 8.7 mg/dL — ABNORMAL LOW (ref 8.9–10.3)
Chloride: 110 mmol/L (ref 101–111)
GFR calc Af Amer: >60 mL/min (ref >60)
GFR calc non Af Amer: >60 mL/min (ref >60)
GLUCOSE: 96 mg/dL (ref 65–99)
POTASSIUM: 3.8 mmol/L (ref 3.5–5.1)
Sodium: 140 mmol/L (ref 135–145)
TOTAL PROTEIN: 7.9 g/dL (ref 6.5–8.1)

## 2015-04-28 LAB — RAPID URINE DRUG SCREEN, HOSP PERFORMED
AMPHETAMINES: NOT DETECTED
BARBITURATES: NOT DETECTED
BENZODIAZEPINES: POSITIVE — AB
Cocaine: NOT DETECTED
Opiates: NOT DETECTED
Tetrahydrocannabinol: NOT DETECTED

## 2015-04-28 LAB — ACETAMINOPHEN LEVEL: Acetaminophen (Tylenol), Serum: <10 ug/mL — ABNORMAL LOW (ref 10–30)

## 2015-04-28 LAB — ETHANOL: Alcohol, Ethyl (B): 89 mg/dL — ABNORMAL HIGH (ref <5)

## 2015-04-28 LAB — SALICYLATE LEVEL: Salicylate Lvl: <4 mg/dL (ref 2.8–30.0)

## 2015-04-28 MED ORDER — ONDANSETRON HCL 4 MG PO TABS: 4.0000 mg | ORAL_TABLET | Freq: Three times a day (TID) | ORAL | Status: DC | PRN

## 2015-04-28 MED ORDER — NICOTINE 21 MG/24HR TD PT24
21.0000 mg | MEDICATED_PATCH | Freq: Every day | TRANSDERMAL | Status: DC
  Filled 2015-04-28: qty 1

## 2015-04-28 MED ORDER — ACETAMINOPHEN 325 MG PO TABS
650.0000 mg | ORAL_TABLET | ORAL | Status: DC | PRN
  Administered 2015-04-28: 650 mg via ORAL
  Filled 2015-04-28: qty 2

## 2015-04-28 MED ORDER — LORAZEPAM 1 MG PO TABS
1.0000 mg | ORAL_TABLET | Freq: Once | ORAL | Status: AC
  Administered 2015-04-28: 1 mg via ORAL
  Filled 2015-04-28: qty 1

## 2015-04-28 MED ORDER — PRAZOSIN HCL 1 MG PO CAPS
1.0000 mg | ORAL_CAPSULE | Freq: Every day | ORAL | Status: DC
  Filled 2015-04-28 (×2): qty 1

## 2015-04-28 MED ORDER — LISINOPRIL 20 MG PO TABS
20.0000 mg | ORAL_TABLET | Freq: Every day | ORAL | Status: DC
  Filled 2015-04-28: qty 1

## 2015-04-28 MED ORDER — MIRTAZAPINE 30 MG PO TABS: 45.0000 mg | ORAL_TABLET | Freq: Every day | ORAL | Status: DC

## 2015-04-28 MED ORDER — ZOLPIDEM TARTRATE 10 MG PO TABS: 10.0000 mg | ORAL_TABLET | Freq: Every evening | ORAL | Status: DC | PRN

## 2015-04-28 MED ORDER — BUSPIRONE HCL 10 MG PO TABS
10.0000 mg | ORAL_TABLET | Freq: Three times a day (TID) | ORAL | Status: DC
  Administered 2015-04-28: 10 mg via ORAL
  Filled 2015-04-28: qty 1
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
## 2015-04-28 NOTE — ED Notes (Signed)
Patient resting quietly at this time.
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
## 2015-04-28 NOTE — BH Assessment (Signed)
Assessment completed. Consulted Ijeoma Nwaeze, NP who recommended that pt be re-evaluated by psychiatry in the morning. Kelly Humes, PA-C has been informed of the recommendation.  

## 2015-04-28 NOTE — Progress Notes (Signed)
12:17pm. CSW met with patient to discuss community resources. Pt requests information on bus routes, homeless shelters, and information on payees. CSW provided bus information and shelter list. CSW and pt discussed payee issues and advised patient to continue follow up at social security office.   [employer] [facility] phone: [phone]

## 2015-04-28 NOTE — ED Notes (Signed)
Staffing made aware of sitter need, SI
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
## 2015-04-30 ENCOUNTER — Encounter (HOSPITAL_COMMUNITY): Payer: Self-pay | Admitting: Emergency Medicine

## 2015-04-30 ENCOUNTER — Emergency Department (HOSPITAL_COMMUNITY)
Admission: EM | Admit: 2015-04-30 | Discharge: 2015-04-30 | Disposition: A | Discharge status: Home / Self Care | Payer: Medicare Other | Attending: Emergency Medicine | Admitting: Emergency Medicine

## 2015-04-30 DIAGNOSIS — F419 Anxiety disorder, unspecified: Secondary | ICD-10-CM | POA: Diagnosis not present

## 2015-04-30 DIAGNOSIS — Z0000 Encounter for general adult medical examination without abnormal findings: Secondary | ICD-10-CM | POA: Diagnosis not present

## 2015-04-30 DIAGNOSIS — J45909 Unspecified asthma, uncomplicated: Secondary | ICD-10-CM | POA: Insufficient documentation

## 2015-04-30 DIAGNOSIS — F4310 Post-traumatic stress disorder, unspecified: Secondary | ICD-10-CM | POA: Diagnosis not present

## 2015-04-30 DIAGNOSIS — F319 Bipolar disorder, unspecified: Secondary | ICD-10-CM | POA: Insufficient documentation

## 2015-04-30 DIAGNOSIS — Z79899 Other long term (current) drug therapy: Secondary | ICD-10-CM | POA: Insufficient documentation

## 2015-04-30 DIAGNOSIS — I10 Essential (primary) hypertension: Secondary | ICD-10-CM | POA: Diagnosis not present

## 2015-04-30 DIAGNOSIS — Z760 Encounter for issue of repeat prescription: Secondary | ICD-10-CM | POA: Insufficient documentation

## 2015-04-30 DIAGNOSIS — Z139 Encounter for screening, unspecified: Secondary | ICD-10-CM

## 2015-04-30 DIAGNOSIS — Z008 Encounter for other general examination: Secondary | ICD-10-CM | POA: Diagnosis present

## 2015-04-30 NOTE — ED Notes (Signed)
Called pt x2, no answer from the lobby.

## 2015-04-30 NOTE — ED Notes (Signed)
Pt stayed until discharged. Given a bus pass. Was very polite upon leaving. Patient was alert, oriented and stable upon discharge. RN went over AVS and patient had no further questions.
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
## 2015-04-30 NOTE — ED Notes (Signed)
Bed: [facility]PT4 Expected date:  Expected time:  Means of arrival:  Comments: Pt still in room  

## 2015-04-30 NOTE — Discharge Instructions (Signed)
Return to the Emergency Department if you develop thoughts of hurting self or others. °
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
## 2015-04-30 NOTE — ED Notes (Signed)
[place] EMS [place], pt sts medications were confiscated and he needs refills.  Pt denies SI/HI or other psych complaints.  A+Ox4.  Ambulatory.  NAD.
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
## 2015-06-27 ENCOUNTER — Encounter (HOSPITAL_COMMUNITY): Payer: Self-pay

## 2015-06-27 ENCOUNTER — Emergency Department (HOSPITAL_COMMUNITY)
Admission: EM | Admit: 2015-06-27 | Discharge: 2015-06-28 | Disposition: A | Discharge status: Home / Self Care | Payer: Medicare Other | Attending: Emergency Medicine | Admitting: Emergency Medicine

## 2015-06-27 DIAGNOSIS — F419 Anxiety disorder, unspecified: Secondary | ICD-10-CM | POA: Insufficient documentation

## 2015-06-27 DIAGNOSIS — I10 Essential (primary) hypertension: Secondary | ICD-10-CM | POA: Diagnosis not present

## 2015-06-27 DIAGNOSIS — F1020 Alcohol dependence, uncomplicated: Secondary | ICD-10-CM | POA: Diagnosis not present

## 2015-06-27 DIAGNOSIS — Z79899 Other long term (current) drug therapy: Secondary | ICD-10-CM | POA: Insufficient documentation

## 2015-06-27 DIAGNOSIS — Z046 Encounter for general psychiatric examination, requested by authority: Secondary | ICD-10-CM

## 2015-06-27 DIAGNOSIS — F332 Major depressive disorder, recurrent severe without psychotic features: Secondary | ICD-10-CM | POA: Insufficient documentation

## 2015-06-27 DIAGNOSIS — J45909 Unspecified asthma, uncomplicated: Secondary | ICD-10-CM | POA: Diagnosis not present

## 2015-06-27 DIAGNOSIS — F4310 Post-traumatic stress disorder, unspecified: Secondary | ICD-10-CM | POA: Diagnosis not present

## 2015-06-27 DIAGNOSIS — R45851 Suicidal ideations: Secondary | ICD-10-CM | POA: Diagnosis present

## 2015-06-27 DIAGNOSIS — X838XXA Intentional self-harm by other specified means, initial encounter: Secondary | ICD-10-CM

## 2015-06-27 LAB — RAPID URINE DRUG SCREEN, HOSP PERFORMED
Amphetamines: NOT DETECTED
BARBITURATES: NOT DETECTED
Benzodiazepines: NOT DETECTED
COCAINE: NOT DETECTED
Opiates: NOT DETECTED
TETRAHYDROCANNABINOL: NOT DETECTED

## 2015-06-27 LAB — CBC
HEMATOCRIT: 41.8 % (ref 39.0–52.0)
HEMOGLOBIN: 14 g/dL (ref 13.0–17.0)
MCH: 27 pg (ref 26.0–34.0)
MCHC: 33.5 g/dL (ref 30.0–36.0)
MCV: 80.7 fL (ref 78.0–100.0)
Platelets: 274 10*3/uL (ref 150–400)
RBC: 5.18 MIL/uL (ref 4.22–5.81)
RDW: 13.8 % (ref 11.5–15.5)
WBC: 5.2 10*3/uL (ref 4.0–10.5)

## 2015-06-27 LAB — COMPREHENSIVE METABOLIC PANEL
ALBUMIN: 4.1 g/dL (ref 3.5–5.0)
ALK PHOS: 99 U/L (ref 38–126)
ALT: 16 U/L — AB (ref 17–63)
ANION GAP: 11 (ref 5–15)
AST: 21 U/L (ref 15–41)
BUN: 8 mg/dL (ref 6–20)
CALCIUM: 8.3 mg/dL — AB (ref 8.9–10.3)
CO2: 24 mmol/L (ref 22–32)
CREATININE: 0.84 mg/dL (ref 0.61–1.24)
Chloride: 107 mmol/L (ref 101–111)
GFR calc Af Amer: >60 mL/min (ref >60)
GFR calc non Af Amer: >60 mL/min (ref >60)
GLUCOSE: 99 mg/dL (ref 65–99)
Potassium: 3.5 mmol/L (ref 3.5–5.1)
SODIUM: 142 mmol/L (ref 135–145)
Total Bilirubin: 0.3 mg/dL (ref 0.3–1.2)
Total Protein: 7.5 g/dL (ref 6.5–8.1)

## 2015-06-27 LAB — SALICYLATE LEVEL: Salicylate Lvl: <4 mg/dL (ref 2.8–30.0)

## 2015-06-27 LAB — ACETAMINOPHEN LEVEL

## 2015-06-27 LAB — ETHANOL: Alcohol, Ethyl (B): 213 mg/dL — ABNORMAL HIGH (ref <5)

## 2015-06-27 MED ORDER — ACETAMINOPHEN 325 MG PO TABS: 650.0000 mg | ORAL_TABLET | ORAL | Status: DC | PRN

## 2015-06-27 MED ORDER — ONDANSETRON HCL 4 MG PO TABS: 4.0000 mg | ORAL_TABLET | Freq: Three times a day (TID) | ORAL | Status: DC | PRN

## 2015-06-27 MED ORDER — LISINOPRIL 20 MG PO TABS
20.0000 mg | ORAL_TABLET | Freq: Every day | ORAL | Status: DC
  Administered 2015-06-27 – 2015-06-28 (×2): 20 mg via ORAL
  Filled 2015-06-27 (×2): qty 1

## 2015-06-27 MED ORDER — ALUM & MAG HYDROXIDE-SIMETH 200-200-20 MG/5ML PO SUSP: 30.0000 mL | ORAL | Status: DC | PRN

## 2015-06-27 MED ORDER — BUSPIRONE HCL 10 MG PO TABS
10.0000 mg | ORAL_TABLET | Freq: Three times a day (TID) | ORAL | Status: DC
  Administered 2015-06-27 – 2015-06-28 (×3): 10 mg via ORAL
  Filled 2015-06-27 (×3): qty 1

## 2015-06-27 MED ORDER — PRAZOSIN HCL 1 MG PO CAPS
1.0000 mg | ORAL_CAPSULE | Freq: Every day | ORAL | Status: DC
  Filled 2015-06-27 (×2): qty 1

## 2015-06-27 MED ORDER — NICOTINE 21 MG/24HR TD PT24
21.0000 mg | MEDICATED_PATCH | Freq: Every day | TRANSDERMAL | Status: DC
  Filled 2015-06-27 (×2): qty 1

## 2015-06-27 MED ORDER — LORAZEPAM 1 MG PO TABS
1.0000 mg | ORAL_TABLET | Freq: Three times a day (TID) | ORAL | Status: DC | PRN
  Administered 2015-06-27: 1 mg via ORAL
  Filled 2015-06-27: qty 1

## 2015-06-27 MED ORDER — IBUPROFEN 200 MG PO TABS: 600.0000 mg | ORAL_TABLET | Freq: Three times a day (TID) | ORAL | Status: DC | PRN

## 2015-06-27 MED ORDER — ZOLPIDEM TARTRATE 5 MG PO TABS: 5.0000 mg | ORAL_TABLET | Freq: Every evening | ORAL | Status: DC | PRN

## 2015-06-27 MED ORDER — RISPERIDONE 2 MG PO TABS
2.0000 mg | ORAL_TABLET | Freq: Every day | ORAL | Status: DC
  Administered 2015-06-27 – 2015-06-28 (×2): 2 mg via ORAL
  Filled 2015-06-27 (×2): qty 1

## 2015-06-27 MED ORDER — MIRTAZAPINE 30 MG PO TABS
45.0000 mg | ORAL_TABLET | Freq: Every day | ORAL | Status: DC
  Administered 2015-06-27: 45 mg via ORAL
  Filled 2015-06-27: qty 2

## 2015-06-27 MED ORDER — ACAMPROSATE CALCIUM 333 MG PO TBEC
333.0000 mg | DELAYED_RELEASE_TABLET | Freq: Three times a day (TID) | ORAL | Status: DC
  Administered 2015-06-27 – 2015-06-28 (×3): 333 mg via ORAL
  Filled 2015-06-27 (×5): qty 1

## 2015-06-27 NOTE — BH Assessment (Signed)
Dr. Fredda HammedAktar recommends patient be observed overnight and evaluated by psychiatry in the morning. He recommends IVC'ing the patient if he is not cooperative in remaining in the ED overnight.  Informed EDP of recommendation.   Davina PokeJoVea Clarrissa Shimkus, LCSW Therapeutic Triage Specialist [facility] 06/27/2015 7:06 PM
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
## 2015-06-27 NOTE — ED Notes (Signed)
Patient appeared very angry at the beginning of the shift. He mood and affect sad and irritable. He reported that he wanted to go to his own house; " I just told that I needed to speak to someone, I didn't sign up to sleep on this bed. This is a waste, this bed can go to someone that needs it better than me".  He said he lost his mother in March and has been having on and off de[pression since then. Physician came to explain thins to patient and writer also told patient to calm dawn and allow staff to evaluate him; also told patient that we are keeping his so he doesn't act on the on and off thoughts of suicide that he said he has been having. Patient did not listen. He threatened to sue the Hospital when he gets out of the here. Writer continues to encourage patient. q 15 minute check continues as ordered for safety.

## 2015-06-27 NOTE — ED Notes (Addendum)
Pt, brought in by [employer], c/o increasing depression and anxiety since March.  Pt reports that his mother passed away in March.  Denies SI/HI/AV. Denies substance abuse.  Pt reports "I've tried to talk to family about this.  They act like they've moved on, but I haven't."  Pt has significant history of substance abuse.  Sts last use was August.     [employer] reports that the Pt told a store clerk that he was suicidal and when they arrived he stated "just shoot me.  I'm not getting the help that I need."  After asking the Pt about this information, he stated "I just wanted to expedite the process, if that makes sense."

## 2015-06-27 NOTE — BH Assessment (Signed)
Contacted Joni Reiningicole PA-C to inform her of the recommendation that patient be IVC'd if not willing to stay for observation overnight. She agrees and states that she will get the IVC paperwork started to IVC patient.   Davina PokeJoVea Janell Keeling, LCSW Therapeutic Triage Specialist [facility] 06/27/2015 7:36 PM'

## 2015-06-27 NOTE — Progress Notes (Signed)
Pt confirmed with ED CM that he does not have a pcp Reports his mother just "died" and "medicare was going to cut off my part A" CM notified pt he is listed with medicare coverage and Cm provided pt with a list of 7 pages of medicare providers with in zip code [phone] Pt inquired about his disposition but [facility] to CM arriving to visit [facility] Pt informed CM he did not speak with a MD or NP today  PMH of anxiety, bipolar 1 disorder, depression and PTSD, SI with onset 6 months ago

## 2015-06-27 NOTE — ED Notes (Signed)
Patient's B/p was low at about 2100 (85/50). Writer gave patient 2 large cups of Gatorades and notified the physician. He said to recheck it in an hour and if it remains low after an hour Clinical research associatewriter should notified the charge nurse in main ED and send patient out there for infusion. Rechecked patient's B/P at 2200, it was 103/59. Physician said no need for infusion. HS medication not given because of sedation and patient refusal to take them.

## 2015-06-27 NOTE — Progress Notes (Signed)
[name] & O X 4. Admitted to [facility] with diagnosis of Bipolar D/O, PTSD and anxiety. Per report Marcus was brought in by [employer] with ETOH 213 on admission and demanded [employer] to shoot him which he denied on initial nursing assessment. Marcus reported increased depression and  within the last 6 months related to death of his mother since March. [facility] home, difficult to redirect at the time with increased anxiety. All medications administered as ordered with PRN Ativan 1 mg PO for anxiety. Marcus is calm at present and cooperative at this time. Encouragement, availability and support provided to Marcus. Safety maintained on Q 15 minutes checks as ordered.

## 2015-06-27 NOTE — ED Notes (Signed)
Physician authorized writer to give HS medications as ordered but hold minipress secondary to low b/p.

## 2015-06-27 NOTE — ED Provider Notes (Signed)
CSN: [id number]     Arrival date & time 06/27/15  1401 History  By signing my name below, I, Placido SouLogan Joldersma, attest that this documentation has been prepared under the direction and in the presence of [employer] Tammara Massing, PA-C. Electronically Signed: Placido SouLogan Joldersma, ED Scribe. 06/27/2015. 3:06 PM.   Chief Complaint  Patient presents with  . Suicidal  . Depression  . Anxiety   The history is provided by the patient. No language interpreter was used.    HPI Comments: [name] is a 44 y.o. male  brought in by [employer], involuntarily committed with a hx of anxiety, bipolar 1 disorder, depression and PTSD,  SI with onset 6 months ago. Pt initially denied SI to nursing staff but [facility] confirms that [facility] to a clerk at a store saying, "Just shoot me. I'm not getting the help that I want". Pt notes that he has been distressed since his mother passed in March of 2016 which has recently worsened. Pt denies any current SI noting the friendly demeanor of the police officers that took him into custody. Pt also present with significant scarring from many lacerations to his bilateral forearms. [employer] notes the situation had escalated severely before the pt was taken into custody further pointing out smelling ETOH at the scene and were concerned of intoxication. He notes having been seen in the past for similar symptoms and was prescribed medications for anxiety but denies currently taking medications for his symptoms. He also notes a hx of substance abuse with his most recent occurrence in 04/2015. Pt denies any hallucinations, illegal narcotic use, HI or other known health concerns.   Past Medical History  Diagnosis Date  . Anxiety   . Bipolar 1 disorder (HCC)   . Asthma   . Hypertension   . Depression   . PTSD (post-traumatic stress disorder)   . Heart palpitations     with severe anxiety   Past Surgical History  Procedure Laterality Date  . No past surgeries     History reviewed. No  pertinent family history. Social History  Substance Use Topics  . Smoking status: Never Smoker   . Smokeless tobacco: Never Used  . Alcohol Use: No     Comment: Denies ETOH use 06/27/15    Review of Systems A complete 10 system review of systems was obtained and all systems are negative except as noted in the HPI and PMH.   Allergies  Review of patient's allergies indicates no known allergies.  Home Medications   Prior to Admission medications   Medication Sig Start Date End Date Taking? Authorizing Provider  busPIRone (BUSPAR) 10 MG tablet Take 1 tablet (10 mg total) by mouth 3 (three) times daily. For anxiety 04/27/15   Sanjuana KavaAgnes I Nwoko, NP  lisinopril (PRINIVIL,ZESTRIL) 20 MG tablet Take 1 tablet (20 mg total) by mouth daily. For high blood pressure 04/27/15   Sanjuana KavaAgnes I Nwoko, NP  mirtazapine (REMERON) 45 MG tablet Take 1 tablet (45 mg total) by mouth at bedtime. For depression 04/27/15   Sanjuana KavaAgnes I Nwoko, NP  prazosin (MINIPRESS) 1 MG capsule Take 1 capsule (1 mg total) by mouth at bedtime. For nighmares 04/27/15   Sanjuana KavaAgnes I Nwoko, NP  zolpidem (AMBIEN) 10 MG tablet Take 1 tablet (10 mg total) by mouth at bedtime as needed for sleep. 04/27/15 05/27/15  Sanjuana KavaAgnes I Nwoko, NP   BP 137/80 mmHg  Pulse 96  Temp(Src) 98.2 F (36.8 C) (Oral)  Resp 16  SpO2  92% Physical Exam  Constitutional: He is oriented to person, place, and time. He appears well-developed and well-nourished.  HENT:  Head: Normocephalic and atraumatic.  Mouth/Throat: No oropharyngeal exudate.  Neck: Normal range of motion. No tracheal deviation present.  Cardiovascular: Normal rate.   Pulmonary/Chest: Effort normal. No respiratory distress.  Abdominal: Soft. There is no tenderness.  Musculoskeletal: Normal range of motion.  Neurological: He is alert and oriented to person, place, and time.  Skin: Skin is warm and dry. He is not diaphoretic.  Multiple well healed cut marks to his bilateral UE's  Psychiatric: He has a normal  mood and affect. His behavior is normal.  Nursing note and vitals reviewed.  ED Course  Procedures  DIAGNOSTIC STUDIES: Oxygen Saturation is 92% on RA, adequate by my interpretation.    COORDINATION OF CARE: 2:57 PM Pt presents today via the [employer] due to an episode of SI. Discussed next steps with pt at bedside. Pt agreed to plan.  Labs Review Labs Reviewed - No data to display  Imaging Review No results found. I have personally reviewed and evaluated these lab results as part of my medical decision-making.   EKG Interpretation None      MDM   Final diagnoses:  Suicide gesture, initial encounter (HCC)  Involuntary commitment    Filed Vitals:   06/27/15 1417  BP: 137/80  Pulse: 96  Temp: 98.2 F (36.8 C)  TempSrc: Oral  Resp: 16  SpO2: 92%    Medications  acamprosate (CAMPRAL) tablet 333 mg (not administered)  busPIRone (BUSPAR) tablet 10 mg (not administered)  lisinopril (PRINIVIL,ZESTRIL) tablet 20 mg (not administered)  mirtazapine (REMERON) tablet 45 mg (not administered)  prazosin (MINIPRESS) capsule 1 mg (not administered)  alum & mag hydroxide-simeth (MAALOX/MYLANTA) 200-200-20 MG/5ML suspension 30 mL (not administered)  ondansetron (ZOFRAN) tablet 4 mg (not administered)  nicotine (NICODERM CQ - dosed in mg/24 hours) patch 21 mg (not administered)  zolpidem (AMBIEN) tablet 5 mg (not administered)  ibuprofen (ADVIL,MOTRIN) tablet 600 mg (not administered)  acetaminophen (TYLENOL) tablet 650 mg (not administered)  LORazepam (ATIVAN) tablet 1 mg (not administered)  risperiDONE (RISPERDAL) tablet 2 mg (not administered)    [name] is 44 y.o. male presenting with suicidal gestures, IVC'd by [employer] apparently he was extremely upset when police were called to scene and repeatedly requesting that they shoot him. The police managed to calm him down and he is denying current suicidal ideation or homicidal ideation states he does not have outpatient psychiatric  care.  Patient is medically cleared for psychiatric evaluation will be transferred to the psych ED. TTS consulted, home meds and psych standard holding orders placed.   I personally performed the services described in this documentation, which was scribed in my presence. The recorded information has been reviewed and is accurate.    Wynetta Emery, PA-C 06/27/15 1728  There was a miscommunication and this patient was not involuntarily committed by [employer]. I discussed this case with general via from act team, she has discussed this with Dr. Gilmore Laroche the psychiatrist who recommended that this patient be involuntarily committed, I agree with this conclusion and think that he does need to be involuntarily committed so he can be personally evaluated by a psychiatrist in the morning given the nature of his suicidal actions. IVC signed by Dr. Antony Salmon Donnie Panik, PA-C 06/27/15 9147  Lavera Guise, MD 06/28/15 [phone]

## 2015-06-27 NOTE — BH Assessment (Signed)
Consulted with Claudette Headonrad Withrow, DNP who states that the patient does not appear to meet IVC criteria at this time if not currently IVC'd but he appears to be intoxicated and may be dangerous if released.   Davina PokeJoVea Anitria Andon, LCSW Therapeutic Triage Specialist [facility] 06/27/2015 6:47 PM

## 2015-06-28 DIAGNOSIS — F4310 Post-traumatic stress disorder, unspecified: Secondary | ICD-10-CM | POA: Diagnosis not present

## 2015-06-28 DIAGNOSIS — F332 Major depressive disorder, recurrent severe without psychotic features: Secondary | ICD-10-CM

## 2015-06-28 DIAGNOSIS — F1020 Alcohol dependence, uncomplicated: Secondary | ICD-10-CM | POA: Diagnosis not present

## 2015-06-28 NOTE — ED Notes (Signed)
Patient resting quietly with eyes closed. Respirations even and unlabored. No distress noted. Q 15 minute checks continues for safety. 

## 2015-06-28 NOTE — ED Provider Notes (Signed)
I was asked to finish completing the IVC paperwork. The first opinion was done but the affidavit and petition was not filled out. Patient states he wants to go home. He states he called the police because he "wanted to talk to somebody". He states the police said that he wanted them to shoot him. Patient has been evaluated by TSS he feels he meets inpatient criteria. However patient wants to be discharged. He is noted to have multiple healed parallel linear scars on both forearms and dorsum of his hands. These appear to be from old self-inflicted injuries. The affidavit and petition was signed by myself and notarized.   Marcus AlbeIva Zykee Avakian, MD, Concha PyoFACEP   Jaquasia Doscher, MD 06/28/15 [phone]

## 2015-06-28 NOTE — BH Assessment (Signed)
[facility] Assessment Progress Note  Per Thedore MinsMojeed Akintayo, MD, this pt does not require psychiatric hospitalization at this time.  Pt is presents under IVC initiated by EDP Devoria AlbeIva Knapp, MD.  Pt [facility] be released from petition [facility].  IVC has been rescinded.  [facility] referral information has been included in pt's discharge instructions.  Pt's nurse has been notified.  Doylene Canninghomas Vennela Jutte, MA Triage Specialist [phone]

## 2015-06-28 NOTE — ED Notes (Signed)
Patient discharged to home.  Denies thoughts of harm to self or others.  All belongings returned and signed for.  Left the unit ambulatory and was escorted to the front lobby.  Patient was given a bus pass for transport home.

## 2015-06-28 NOTE — Consult Note (Signed)
[facility] Consult   Reason for Consult:  Alcohol abuse with suicidal ideations Referring Physician:  EDP Patient Identification: ALHASSAN EVERINGHAM MRN:  [id number] Principal Diagnosis: Major depressive disorder, recurrent, severe without psychotic features [facility]) Diagnosis:   Patient Active Problem List   Diagnosis Date Noted  . Substance induced mood disorder ([place]) [F19.94] 04/28/2015    Priority: High  . Major depressive disorder, recurrent, severe without psychotic features ([place]) [F33.2]     Priority: High  . Cocaine dependence with cocaine-induced mood disorder ([place]) [F14.24]     Priority: High  . Alcohol use disorder, severe, dependence ([place]) [F10.20] 04/12/2015    Priority: High  . Post traumatic stress disorder (PTSD) [F43.10] 09/08/2011    Priority: High  . Recurrent major depression-severe ([place]) [F33.2] 04/23/2015  . Severe recurrent major depression without psychotic features ([place]) [F33.2] 04/13/2015  . Cocaine dependence ([place]) [F14.20] 04/07/2013  . Hypertension [I10] 09/08/2011    Total Time spent with patient: 45 minutes  Subjective:   MAKAIL WATLING is a 44 y.o. male patient does not warrant admission.  HPI:  44 yo male presented to the ED with alcohol intoxication and suicidal ideations, history of depression.  Today, he states he feels "much better" as he no longer feels suicidal.  Suicide attempt over 20 years ago and will call the crisis line or come to the ED, if these thoughts return.  He drinks 2 glasses of wine daily but does not want assistance with detox, last rehab was in 2014.  Denies suicidal/homicidal ideations, hallucinations, and drug abuse.  No withdrawal symptoms.  Patient given a list of Medicare providers as his changed.  Stable for discharge.  Past Psychiatric History: Depression, alcohol and cocaine abuse  Risk to Self: Suicidal Ideation: No Suicidal Intent: No Is patient at risk for suicide?: No Suicidal Plan?: No Specify  Current Suicidal Plan: Denies Access to Means: No Specify Access to Suicidal Means: N/A What has been your use of drugs/alcohol within the last 12 months?: Alcohol How many times?: 3 (cut) Other Self Harm Risks: None recent Triggers for Past Attempts: Other (Comment) (name calling) Intentional Self Injurious Behavior: Cutting Comment - Self Injurious Behavior: none recent Risk to Others: Homicidal Ideation: No Thoughts of Harm to Others: No Comment - Thoughts of Harm to Others: Denies Current Homicidal Intent: No Current Homicidal Plan: No Access to Homicidal Means: No Identified Victim: Denies History of harm to others?: No Assessment of Violence: None Noted Violent Behavior Description: Denies Does patient have access to weapons?: No Criminal Charges Pending?: No Does patient have a court date: No Prior Inpatient Therapy: Prior Inpatient Therapy: Yes Prior Therapy Dates: 2011-2016 Prior Therapy Facilty/Provider(s): [facility], [facility], [facility] Reason for Treatment: MDD, PTSD, SA Prior Outpatient Therapy: Prior Outpatient Therapy: Yes Prior Therapy Dates: 2014 Prior Therapy Facilty/Provider(s): [facility] Reason for Treatment: ACTT Does patient have an ACCT team?: No Does patient have Intensive In-House Services?  : No Does patient have [facility] services? : No Does patient have P4CC services?: No  Past Medical History:  Past Medical History  Diagnosis Date  . Anxiety   . Bipolar 1 disorder ([place])   . Asthma   . Hypertension   . Depression   . PTSD (post-traumatic stress disorder)   . Heart palpitations     with severe anxiety    Past Surgical History  Procedure Laterality Date  . No past surgeries     Family History: History reviewed. No pertinent family history.  Family Psychiatric  History: Mental illness on both sides with maternal suicides Social History:  History  Alcohol Use No    Comment: Denies ETOH use 06/27/15     History  Drug Use No     Comment: Denies 06/27/15    Social History   Social History  . Marital Status: Single    Spouse Name: N/A  . Number of Children: N/A  . Years of Education: N/A   Social History Main Topics  . Smoking status: Never Smoker   . Smokeless tobacco: Never Used  . Alcohol Use: No     Comment: Denies ETOH use 06/27/15  . Drug Use: No     Comment: Denies 06/27/15  . Sexual Activity: No   Other Topics Concern  . None   Social History Narrative   Additional Social History:    Pain Medications: See PTA Prescriptions: See PTA Over the Counter: See PTA History of alcohol / drug use?: Yes Longest period of sobriety (when/how long): week Name of Substance 1: Alcohol 1 - Age of First [age]: [age] 1 - Amount (size/oz): 2-3 shots 1 - Frequency: daily 1 - Duration: ongoing 1 - Last Use / Amount: 06/27/2015                   Allergies:  No Known Allergies  Labs:  Results for orders placed or performed during the hospital encounter of 06/27/15 (from the past 48 hour(s))  Urine rapid drug screen (hosp performed) (Not at [facility])     Status: None   Collection Time: 06/27/15  2:49 PM  Result Value Ref Range   Opiates NONE DETECTED NONE DETECTED   Cocaine NONE DETECTED NONE DETECTED   Benzodiazepines NONE DETECTED NONE DETECTED   Amphetamines NONE DETECTED NONE DETECTED   Tetrahydrocannabinol NONE DETECTED NONE DETECTED   Barbiturates NONE DETECTED NONE DETECTED    Comment:        DRUG SCREEN FOR MEDICAL PURPOSES ONLY.  IF CONFIRMATION IS NEEDED FOR ANY PURPOSE, NOTIFY LAB WITHIN 5 DAYS.        LOWEST DETECTABLE LIMITS FOR URINE DRUG SCREEN Drug Class       Cutoff (ng/mL) Amphetamine      1000 Barbiturate      200 Benzodiazepine   811 Tricyclics       572 Opiates          300 Cocaine          300 THC              50   Comprehensive metabolic panel     Status: Abnormal   Collection Time: 06/27/15  3:33 PM  Result Value Ref Range   Sodium 142 135 - 145 mmol/L    Potassium 3.5 3.5 - 5.1 mmol/L   Chloride 107 101 - 111 mmol/L   CO2 24 22 - 32 mmol/L   Glucose, Bld 99 65 - 99 mg/dL   BUN 8 6 - 20 mg/dL   Creatinine, Ser 0.84 0.61 - 1.24 mg/dL   Calcium 8.3 (L) 8.9 - 10.3 mg/dL   Total Protein 7.5 6.5 - 8.1 g/dL   Albumin 4.1 3.5 - 5.0 g/dL   AST 21 15 - 41 U/L   ALT 16 (L) 17 - 63 U/L   Alkaline Phosphatase 99 38 - 126 U/L   Total Bilirubin 0.3 0.3 - 1.2 mg/dL   GFR calc non Af Amer >60 >60 mL/min   GFR calc Af Amer >60 >60 mL/min  Comment: (NOTE) The eGFR has been calculated using the CKD EPI equation. This calculation has not been validated in all clinical situations. eGFR's persistently <60 mL/min signify possible Chronic Kidney Disease.    Anion gap 11 5 - 15  Ethanol (ETOH)     Status: Abnormal   Collection Time: 06/27/15  3:33 PM  Result Value Ref Range   Alcohol, Ethyl (B) 213 (H) <5 mg/dL    Comment:        LOWEST DETECTABLE LIMIT FOR SERUM ALCOHOL IS 5 mg/dL FOR MEDICAL PURPOSES ONLY   Salicylate level     Status: None   Collection Time: 06/27/15  3:33 PM  Result Value Ref Range   Salicylate Lvl <8.6 2.8 - 30.0 mg/dL  Acetaminophen level     Status: Abnormal   Collection Time: 06/27/15  3:33 PM  Result Value Ref Range   Acetaminophen (Tylenol), Serum <10 (L) 10 - 30 ug/mL    Comment:        THERAPEUTIC CONCENTRATIONS VARY SIGNIFICANTLY. A RANGE OF 10-30 ug/mL MAY BE AN EFFECTIVE CONCENTRATION FOR MANY PATIENTS. HOWEVER, SOME ARE BEST TREATED AT CONCENTRATIONS OUTSIDE THIS RANGE. ACETAMINOPHEN CONCENTRATIONS >150 ug/mL AT 4 HOURS AFTER INGESTION AND >50 ug/mL AT 12 HOURS AFTER INGESTION ARE OFTEN ASSOCIATED WITH TOXIC REACTIONS.   CBC     Status: None   Collection Time: 06/27/15  3:33 PM  Result Value Ref Range   WBC 5.2 4.0 - 10.5 K/uL   RBC 5.18 4.22 - 5.81 MIL/uL   Hemoglobin 14.0 13.0 - 17.0 g/dL   HCT 41.8 39.0 - 52.0 %   MCV 80.7 78.0 - 100.0 fL   MCH 27.0 26.0 - 34.0 pg   MCHC 33.5 30.0 - 36.0 g/dL    RDW 13.8 11.5 - 15.5 %   Platelets 274 150 - 400 K/uL    Current Facility-Administered Medications  Medication Dose Route Frequency Provider Last Rate Last Dose  . acamprosate (CAMPRAL) tablet 333 mg  333 mg Oral TID Monico Blitz, PA-C   333 mg at 06/28/15 0933  . acetaminophen (TYLENOL) tablet 650 mg  650 mg Oral Q4H PRN Nicole Pisciotta, PA-C      . alum & mag hydroxide-simeth (MAALOX/MYLANTA) 200-200-20 MG/5ML suspension 30 mL  30 mL Oral PRN Nicole Pisciotta, PA-C      . busPIRone (BUSPAR) tablet 10 mg  10 mg Oral TID Monico Blitz, PA-C   10 mg at 06/28/15 0933  . ibuprofen (ADVIL,MOTRIN) tablet 600 mg  600 mg Oral Q8H PRN Nicole Pisciotta, PA-C      . lisinopril (PRINIVIL,ZESTRIL) tablet 20 mg  20 mg Oral Daily Nicole Pisciotta, PA-C   20 mg at 06/28/15 0933  . mirtazapine (REMERON) tablet 45 mg  45 mg Oral QHS Nicole Pisciotta, PA-C   45 mg at 06/27/15 2224  . nicotine (NICODERM CQ - dosed in mg/24 hours) patch 21 mg  21 mg Transdermal Daily Nicole Pisciotta, PA-C   21 mg at 06/27/15 1855  . ondansetron (ZOFRAN) tablet 4 mg  4 mg Oral Q8H PRN Nicole Pisciotta, PA-C      . prazosin (MINIPRESS) capsule 1 mg  1 mg Oral QHS Nicole Pisciotta, PA-C   Stopped at 06/27/15 2218  . risperiDONE (RISPERDAL) tablet 2 mg  2 mg Oral Daily Nicole Pisciotta, PA-C   2 mg at 06/28/15 7672  . zolpidem (AMBIEN) tablet 5 mg  5 mg Oral QHS PRN Monico Blitz, PA-C       Current Outpatient Prescriptions  Medication Sig Dispense Refill  . acamprosate (CAMPRAL) 333 MG tablet Take 333 mg by mouth 3 (three) times daily.    . busPIRone (BUSPAR) 10 MG tablet Take 1 tablet (10 mg total) by mouth 3 (three) times daily. For anxiety 60 tablet 0  . lisinopril (PRINIVIL,ZESTRIL) 20 MG tablet Take 1 tablet (20 mg total) by mouth daily. For high blood pressure    . mirtazapine (REMERON) 45 MG tablet Take 1 tablet (45 mg total) by mouth at bedtime. For depression 30 tablet 0  . NON FORMULARY Take 1 tablet by  mouth 2 (two) times daily. testosterone tablet from [facility]    . prazosin (MINIPRESS) 1 MG capsule Take 1 capsule (1 mg total) by mouth at bedtime. For nighmares 30 capsule 0  . zolpidem (AMBIEN) 10 MG tablet Take 1 tablet (10 mg total) by mouth at bedtime as needed for sleep. 7 tablet 0    Musculoskeletal: Strength & Muscle Tone: within normal limits Gait & Station: normal Patient leans: N/A  Psychiatric Specialty Exam: Review of Systems  Constitutional: Negative.   HENT: Negative.   Eyes: Negative.   Respiratory: Negative.   Cardiovascular: Negative.   Gastrointestinal: Negative.   Genitourinary: Negative.   Musculoskeletal: Negative.   Skin: Negative.   Neurological: Negative.   Endo/Heme/Allergies: Negative.   Psychiatric/Behavioral: Positive for depression and substance abuse.    Blood pressure 93/52, pulse 80, temperature 98.6 F (37 C), temperature source Oral, resp. rate 16, SpO2 97 %.There is no weight on file to calculate BMI.  General Appearance: Casual  Eye Contact::  Good  Speech:  Normal Rate  Volume:  Normal  Mood:  Depressed  Affect:  Congruent  Thought Process:  Coherent  Orientation:  Full (Time, Place, and Person)  Thought Content:  WDL  Suicidal Thoughts:  No  Homicidal Thoughts:  No  Memory:  Immediate;   Good Recent;   Good Remote;   Good  Judgement:  Fair  Insight:  Good  Psychomotor Activity:  Normal  Concentration:  Good  Recall:  Good  Fund of Knowledge:Good  Language: Good  Akathisia:  No  Handed:  Right  AIMS (if indicated):     Assets:  Housing Leisure Time Physical Health Resilience Social Support  ADL's:  Intact  Cognition: WNL  Sleep:      Treatment Plan Summary: Daily contact with patient to assess and evaluate symptoms and progress in treatment, Medication management and Plan major depression, recurrent, moderate, without psychosis  -Crisis stabilization -Substance abuse and individual counseling -Referrals to Medicare  psychiatrists and therapists  Disposition: No evidence of imminent risk to self or others at present.    Waylan Boga, [place] 06/28/2015 11:04 AM Patient seen face-to-face for psychiatric evaluation, chart reviewed and case discussed with the physician extender and developed treatment plan. Reviewed the information documented and agree with the treatment plan. Corena Pilgrim, MD

## 2015-06-28 NOTE — Discharge Instructions (Signed)
For your ongoing mental health needs, you are advised to follow up with [facility].  If you do not currently have an appointment, new and returning patients are seen at their walk-in clinic.  Walk-in hours are Monday - Friday from 8:00 am - 3:00 pm.  Walk-in patients are seen on a first come, first served basis.  Try to arrive as early as possible for he best chance of being seen the same day: ° °     [facility] °     [address] °     [place], [place] [address] °     [phone]

## 2015-06-28 NOTE — [facility] Suicide Risk Assessment (Signed)
Suicide Risk Assessment  Discharge Assessment   [facility]   Demographic Factors:  Male and Living alone  Total Time spent with patient: 45 minutes  Musculoskeletal: Strength & Muscle Tone: within normal limits Gait & Station: normal Patient leans: N/A  Psychiatric Specialty Exam: Review of Systems  Constitutional: Negative.   HENT: Negative.   Eyes: Negative.   Respiratory: Negative.   Cardiovascular: Negative.   Gastrointestinal: Negative.   Genitourinary: Negative.   Musculoskeletal: Negative.   Skin: Negative.   Neurological: Negative.   Endo/Heme/Allergies: Negative.   Psychiatric/Behavioral: Positive for depression and substance abuse.    Blood pressure 93/52, pulse 80, temperature 98.6 F (37 C), temperature source Oral, resp. rate 16, SpO2 97 %.There is no weight on file to calculate BMI.  General Appearance: Casual  Eye Contact::  Good  Speech:  Normal Rate  Volume:  Normal  Mood:  Depressed  Affect:  Congruent  Thought Process:  Coherent  Orientation:  Full (Time, Place, and Person)  Thought Content:  WDL  Suicidal Thoughts:  No  Homicidal Thoughts:  No  Memory:  Immediate;   Good Recent;   Good Remote;   Good  Judgement:  Fair  Insight:  Good  Psychomotor Activity:  Normal  Concentration:  Good  Recall:  Good  Fund of Knowledge:Good  Language: Good  Akathisia:  No  Handed:  Right  AIMS (if indicated):     Assets:  Housing Leisure Time Physical Health Resilience Social Support  ADL's:  Intact  Cognition: WNL  Sleep:         Has this patient used any form of tobacco in the last 30 days? (Cigarettes, Smokeless Tobacco, Cigars, and/or Pipes) No  Mental Status Per Nursing Assessment::   On Admission:   Alcohol intoxication  Current Mental Status by Physician: NA  Loss Factors: NA  Historical Factors: NA  Risk Reduction Factors:   Sense of responsibility to family, Positive social support and Positive  therapeutic relationship  Continued Clinical Symptoms:  Depression, moderate  Cognitive Features That Contribute To Risk:  None    Suicide Risk:  Minimal: No identifiable suicidal ideation.  Patients presenting with no risk factors but with morbid ruminations; may be classified as minimal risk based on the severity of the depressive symptoms  Principal Problem: Major depressive disorder, recurrent, severe without psychotic features [facility](HCC) Discharge Diagnoses:  Patient Active Problem List   Diagnosis Date Noted  . Substance induced mood disorder (HCC) [F19.94] 04/28/2015    Priority: High  . Major depressive disorder, recurrent, severe without psychotic features (HCC) [F33.2]     Priority: High  . Cocaine dependence with cocaine-induced mood disorder (HCC) [F14.24]     Priority: High  . Alcohol use disorder, severe, dependence (HCC) [F10.20] 04/12/2015    Priority: High  . Post traumatic stress disorder (PTSD) [F43.10] 09/08/2011    Priority: High  . Recurrent major depression-severe (HCC) [F33.2] 04/23/2015  . Severe recurrent major depression without psychotic features (HCC) [F33.2] 04/13/2015  . Cocaine dependence (HCC) [F14.20] 04/07/2013  . Hypertension [I10] 09/08/2011    Follow-up Information    Schedule an appointment as soon as possible for a visit with Please use the medicare resources provided to you in emergency room by case manager to assist with doctor for follow up .   Why:  As needed      Plan Of Care/Follow-up recommendations:  Activity:  as tolerated Diet:  heart healthy diet  Is patient on multiple antipsychotic  therapies at discharge:  No   Has Patient had three or more failed trials of antipsychotic monotherapy by history:  No  Recommended Plan for Multiple Antipsychotic Therapies: NA    LORD, JAMISON, PMH-NP 06/28/2015, 11:11 AM

## 2016-01-30 ENCOUNTER — Encounter (HOSPITAL_COMMUNITY): Payer: Self-pay | Admitting: *Deleted

## 2016-01-30 ENCOUNTER — Emergency Department (HOSPITAL_COMMUNITY): Payer: Medicare Other

## 2016-01-30 ENCOUNTER — Encounter (HOSPITAL_COMMUNITY): Payer: Self-pay

## 2016-01-30 ENCOUNTER — Emergency Department (HOSPITAL_COMMUNITY)
Admission: EM | Admit: 2016-01-30 | Discharge: 2016-01-30 | Disposition: A | Discharge status: Home / Self Care | Payer: Medicare Other | Attending: Emergency Medicine | Admitting: Emergency Medicine

## 2016-01-30 DIAGNOSIS — F329 Major depressive disorder, single episode, unspecified: Secondary | ICD-10-CM | POA: Diagnosis present

## 2016-01-30 DIAGNOSIS — F4310 Post-traumatic stress disorder, unspecified: Secondary | ICD-10-CM | POA: Insufficient documentation

## 2016-01-30 DIAGNOSIS — I10 Essential (primary) hypertension: Secondary | ICD-10-CM | POA: Diagnosis not present

## 2016-01-30 DIAGNOSIS — F1414 Cocaine abuse with cocaine-induced mood disorder: Secondary | ICD-10-CM | POA: Diagnosis present

## 2016-01-30 LAB — RAPID URINE DRUG SCREEN, HOSP PERFORMED
Amphetamines: NOT DETECTED
BARBITURATES: NOT DETECTED
BENZODIAZEPINES: NOT DETECTED
Cocaine: POSITIVE — AB
Opiates: NOT DETECTED
Tetrahydrocannabinol: NOT DETECTED

## 2016-01-30 LAB — URINALYSIS, ROUTINE W REFLEX MICROSCOPIC
BILIRUBIN URINE: NEGATIVE
Glucose, UA: NEGATIVE mg/dL
HGB URINE DIPSTICK: NEGATIVE
Ketones, ur: NEGATIVE mg/dL
Leukocytes, UA: NEGATIVE
NITRITE: NEGATIVE
PROTEIN: NEGATIVE mg/dL
SPECIFIC GRAVITY, URINE: 1.005 (ref 1.005–1.030)
pH: 7 (ref 5.0–8.0)

## 2016-01-30 LAB — COMPREHENSIVE METABOLIC PANEL
ALBUMIN: 4.5 g/dL (ref 3.5–5.0)
ALK PHOS: 96 U/L (ref 38–126)
ALT: 11 U/L — AB (ref 17–63)
AST: 20 U/L (ref 15–41)
Anion gap: 10 (ref 5–15)
BILIRUBIN TOTAL: 0.3 mg/dL (ref 0.3–1.2)
CALCIUM: 8.7 mg/dL — AB (ref 8.9–10.3)
CO2: 20 mmol/L — ABNORMAL LOW (ref 22–32)
CREATININE: 0.93 mg/dL (ref 0.61–1.24)
Chloride: 105 mmol/L (ref 101–111)
GFR calc Af Amer: >60 mL/min (ref >60)
GLUCOSE: 103 mg/dL — AB (ref 65–99)
POTASSIUM: 4 mmol/L (ref 3.5–5.1)
Sodium: 135 mmol/L (ref 135–145)
TOTAL PROTEIN: 7.8 g/dL (ref 6.5–8.1)

## 2016-01-30 LAB — CBC
HEMATOCRIT: 43.2 % (ref 39.0–52.0)
Hemoglobin: 14.7 g/dL (ref 13.0–17.0)
MCH: 27.4 pg (ref 26.0–34.0)
MCHC: 34 g/dL (ref 30.0–36.0)
MCV: 80.6 fL (ref 78.0–100.0)
PLATELETS: 274 10*3/uL (ref 150–400)
RBC: 5.36 MIL/uL (ref 4.22–5.81)
RDW: 13.8 % (ref 11.5–15.5)
WBC: 6.8 10*3/uL (ref 4.0–10.5)

## 2016-01-30 LAB — ACETAMINOPHEN LEVEL: Acetaminophen (Tylenol), Serum: <10 ug/mL — ABNORMAL LOW (ref 10–30)

## 2016-01-30 LAB — ETHANOL: ALCOHOL ETHYL (B): 110 mg/dL — AB (ref <5)

## 2016-01-30 LAB — SALICYLATE LEVEL: Salicylate Lvl: <4 mg/dL (ref 2.8–30.0)

## 2016-01-30 MED ORDER — LORAZEPAM 2 MG/ML IJ SOLN: 0.0000 mg | Freq: Two times a day (BID) | INTRAMUSCULAR | Status: DC

## 2016-01-30 MED ORDER — ACETAMINOPHEN 325 MG PO TABS
650.0000 mg | ORAL_TABLET | Freq: Once | ORAL | Status: AC
  Administered 2016-01-30: 650 mg via ORAL
  Filled 2016-01-30: qty 2

## 2016-01-30 MED ORDER — SODIUM CHLORIDE 0.9 % IV BOLUS (SEPSIS)
1000.0000 mL | Freq: Once | INTRAVENOUS | Status: AC
  Administered 2016-01-30: 1000 mL via INTRAVENOUS

## 2016-01-30 MED ORDER — LORAZEPAM 2 MG/ML IJ SOLN
0.0000 mg | Freq: Four times a day (QID) | INTRAMUSCULAR | Status: DC
Start: 2016-01-30 — End: 2016-01-30
  Administered 2016-01-30: 1 mg via INTRAVENOUS
  Filled 2016-01-30: qty 1

## 2016-01-30 NOTE — ED Provider Notes (Signed)
CSN: [id number]     Arrival date & time 01/30/16  0503 History   First MD Initiated Contact with Patient 01/30/16 [phone]     Chief Complaint  Patient presents with  . Depression    HPI   [name] is an 45 y.o. male with history of depression and anxiety who presents to the ED for evaluation of depression. He states he has felt depressed since his mother passed away in 10/2014. He states that he used to take Buspar and another medication to help with his depression and anxiety but ran out in October 2016 and has not been on any meds since then. He states he is here because his anxiety and depression is flaring up and he does not "want to be here anymore." He denies SI or HI. However, he states he feels "hopeless." He admits to drinking alcohol and using cocaine yesterday to self-medicated. He states typically he drinks EtOH once a week and uses illicit drugs even less frequently. His arms have numerous self-inflicted injury scars though he denies recent self harm. He denies AH/VH. He states he is here because he needs psychiatric help. He is here voluntarily.  Past Medical History  Diagnosis Date  . Anxiety   . Depression   . PTSD (post-traumatic stress disorder)   . Hypertension    History reviewed. No pertinent past surgical history. No family history on file. Social History  Substance Use Topics  . Smoking status: None  . Smokeless tobacco: None  . Alcohol Use: Yes     Comment: pt states that he drinks 7 days out of the month    Review of Systems  All other systems reviewed and are negative.     Allergies  Review of patient's allergies indicates no known allergies.  Home Medications   Prior to Admission medications   Not on File   BP 167/106 mmHg  Pulse 100  Temp(Src) 98.5 F (36.9 C) (Oral)  Resp 20  SpO2 95% Physical Exam  Constitutional: He is oriented to person, place, and time.  HENT:  Right Ear: External ear normal.  Left Ear: External ear normal.  Nose:  Nose normal.  Mouth/Throat: Oropharynx is clear and moist. No oropharyngeal exudate.  Eyes: Conjunctivae and EOM are normal. Pupils are equal, round, and reactive to light.  Neck: Normal range of motion. Neck supple.  Cardiovascular: Normal rate, regular rhythm, normal heart sounds and intact distal pulses.   Pulmonary/Chest: Effort normal and breath sounds normal. No respiratory distress. He has no wheezes. He exhibits no tenderness.  Abdominal: Soft. Bowel sounds are normal. He exhibits no distension. There is no tenderness. There is no rebound.  Musculoskeletal: He exhibits no edema.  Neurological: He is alert and oriented to person, place, and time. No cranial nerve deficit.  Skin: Skin is warm and dry.  Psychiatric:  Tearful at times. Flat, withdrawn, depressed affect  Nursing note and vitals reviewed.  Filed Vitals:   01/30/16 0522 01/30/16 0616 01/30/16 0738  BP: 147/93 167/106 138/90  Pulse: 120 100 82  Temp: 100.3 F (37.9 C) 98.5 F (36.9 C)   TempSrc: Oral    Resp: 16 20   SpO2: 93% 95%      ED Course  Procedures (including critical care time) Labs Review Labs Reviewed  COMPREHENSIVE METABOLIC PANEL - Abnormal; Notable for the following:    CO2 20 (*)    Glucose, Bld 103 (*)    BUN <5 (*)    Calcium 8.7 (*)  ALT 11 (*)    All other components within normal limits  ETHANOL - Abnormal; Notable for the following:    Alcohol, Ethyl (B) 110 (*)    All other components within normal limits  ACETAMINOPHEN LEVEL - Abnormal; Notable for the following:    Acetaminophen (Tylenol), Serum <10 (*)    All other components within normal limits  URINE RAPID DRUG SCREEN, HOSP PERFORMED - Abnormal; Notable for the following:    Cocaine POSITIVE (*)    All other components within normal limits  SALICYLATE LEVEL  CBC  URINALYSIS, ROUTINE W REFLEX MICROSCOPIC (NOT AT [facility])    Imaging Review Dg Chest 2 View  01/30/2016  CLINICAL DATA:  Anxiety and depression, medical  clearance. EXAM: CHEST  2 VIEW COMPARISON:  None. FINDINGS: Lungs are adequately inflated without consolidation or effusion. Mild tortuosity of the thoracic aorta. Cardiomediastinal silhouette is within normal. There are mild degenerative changes of the spine. IMPRESSION: No active cardiopulmonary disease. Electronically Signed   By: Elberta Fortis M.D.   On: 01/30/2016 08:41   I have personally reviewed and evaluated these images and lab results as part of my medical decision-making.   EKG Interpretation None      MDM   Final diagnoses:  Cocaine abuse with cocaine-induced mood disorder (HCC)    [name] is an 45 y.o. male with long history of depression and anxiety here for psychiatric evaluation. Although he verbally denies SI he does state he does not want to be here anymore and feels hopeless. He did use EtOH and cocaine yesterday to self-medicate though states this is not a regular occurrence for him. He is here for psychiatric evaluation and i feel he would benefit from TTS consultation as well. Initial fever resolved on its own, urine and CXR negative. He is medically cleared. TTS has been consulted.  Per TTS, recommendation is for overnight obs. Pt declines at this time. He apparently told TTS that's "not why I'm here." TTS does not feel pt warrants IVC as he is not suicidal or homicidal. I agree. He does have a long history of depression/anxiety and is familiar with mental health resources in the area. I encouraged him to follow up with [facility]. ER return precautions given.   Carlene Coria, PA-C 01/30/16 1406  April Palumbo, MD 01/31/16 0005

## 2016-01-30 NOTE — BH Assessment (Signed)
Writer met with patient face to face. Completed patient's TTS assessment. Writer discussed clinicals with Waylan Boga, NP. Patient denies SI, HI, and AVH's. Complaints today were related to depression, anxiety, stress, PTSD, alcohol abuse, "ran out of my medications", and psychosocial factors surrounding his insurance benefits. Waylan Boga, NP recommended OBS unit at [facility]. Writer discussed Jamison's recommendations with patient and he declined. Patient sts, "I didn't come here for that". Writer again explained the benefits of a Obs admission, patient again declined. Discussed patient's disposition with  [facility] and she agreed to discharge patient. Patient will be provided with a list of referrals, including [facility] to meet his outpatient needs.

## 2016-01-30 NOTE — ED Notes (Signed)
Pt arrives to the ER via EMS for complaints if increasing depression; pt states that his mother died a year ago and he is having trouble dealing with it; pt states that he has lost his Medicaid and has not been taking his depression or HTN medications; pt states "I just don't want to be here anymore"; pt denies suicidal ideations or plan

## 2016-01-30 NOTE — Discharge Instructions (Signed)
Stimulant Use Disorder-Cocaine  Cocaine is one of a group of powerful drugs called stimulants. Cocaine has medical uses for stopping nosebleeds and for pain control before minor nose or dental surgery. However, cocaine is misused because of the effects that it produces. These effects include:   · A feeling of extreme pleasure.  · Alertness.  · High energy.  Common street names for cocaine include coke, crack, blow, snow, and nose candy. Cocaine is snorted, dissolved in water and injected, or smoked.   Stimulants are addictive because they activate regions of the brain that produce both the pleasurable sensation of "reward" and psychological dependence. Together, these actions account for loss of control and the rapid development of drug dependence. This means you become ill without the drug (withdrawal) and need to keep using it to function.   Stimulant use disorder is use of stimulants that disrupts your daily life. It disrupts relationships with family and friends and how you do your job. Cocaine increases your blood pressure and heart rate. It can cause a heart attack or stroke. Cocaine can also cause death from irregular heart rate or seizures.  SYMPTOMS  Symptoms of stimulant use disorder with cocaine include:  · Use of cocaine in larger amounts or over a longer period of time than intended.  · Unsuccessful attempts to cut down or control cocaine use.  · A lot of time spent obtaining, using, or recovering from the effects of cocaine.  · A strong desire or urge to use cocaine (craving).  · Continued use of cocaine in spite of major problems at work, school, or home because of use.  · Continued use of cocaine in spite of relationship problems because of use.  · Giving up or cutting down on important life activities because of cocaine use.  · Use of cocaine over and over in situations when it is physically hazardous, such as driving a car.  · Continued use of cocaine in spite of a physical problem that is likely  related to use. Physical problems can include:  ¨ Malnutrition.  ¨ Nosebleeds.  ¨ Chest pain.  ¨ High blood pressure.  ¨ A hole that develops between the part of your nose that separates your nostrils (perforated nasal septum).  ¨ Lung and kidney damage.  · Continued use of cocaine in spite of a mental problem that is likely related to use. Mental problems can include:  ¨ Schizophrenia-like symptoms.  ¨ Depression.  ¨ Bipolar mood swings.  ¨ Anxiety.  ¨ Sleep problems.  · Need to use more and more cocaine to get the same effect, or lessened effect over time with use of the same amount of cocaine (tolerance).  · Having withdrawal symptoms when cocaine use is stopped, or using cocaine to reduce or avoid withdrawal symptoms. Withdrawal symptoms include:  ¨ Depressed or irritable mood.  ¨ Low energy or restlessness.  ¨ Bad dreams.  ¨ Poor or excessive sleep.  ¨ Increased appetite.  DIAGNOSIS  Stimulant use disorder is diagnosed by your health care provider. You may be asked questions about your cocaine use and how it affects your life. A physical exam may be done. A drug screen may be ordered. You may be referred to a mental health professional. The diagnosis of stimulant use disorder requires at least two symptoms within 12 months. The type of stimulant use disorder depends on the number of signs and symptoms you have. The type may be:  · Mild. Two or three signs and symptoms.  ·   Moderate. Four or five signs and symptoms.  · Severe. Six or more signs and symptoms.  TREATMENT  Treatment for stimulant use disorder is usually provided by mental health professionals with training in substance use disorders. The following options are available:  · Counseling or talk therapy. Talk therapy addresses the reasons you use cocaine and ways to keep you from using again. Goals of talk therapy include:  ¨ Identifying and avoiding triggers for use.  ¨ Handling cravings.  ¨ Replacing use with healthy activities.  · Support groups.  Support groups provide emotional support, advice, and guidance.  · Medicine. Certain medicines may decrease cocaine cravings or withdrawal symptoms.  HOME CARE INSTRUCTIONS  · Take medicines only as directed by your health care provider.  · Identify the people and activities that trigger your cocaine use and avoid them.  · Keep all follow-up visits as directed by your health care provider.  SEEK MEDICAL CARE IF:  · Your symptoms get worse or you relapse.  · You are not able to take medicines as directed.  SEEK IMMEDIATE MEDICAL CARE IF:  · You have serious thoughts about hurting yourself or others.  · You have a seizure, chest pain, sudden weakness, or loss of speech or vision.  FOR MORE INFORMATION  · National Institute on Drug Abuse: www.drugabuse.gov  · Substance Abuse and Mental Health Services Administration: www.samhsa.gov     This information is not intended to replace advice given to you by your health care provider. Make sure you discuss any questions you have with your health care provider.     Document Released: 08/15/2000 Document Revised: 09/08/2014 Document Reviewed: 08/31/2013  [employer] Interactive Patient Education ©2016 [employer]
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
## 2016-01-30 NOTE — ED Notes (Signed)
Pt. Received breakfast tray. Nurse aware.  

## 2016-05-17 ENCOUNTER — Emergency Department (HOSPITAL_COMMUNITY)
Admission: EM | Admit: 2016-05-17 | Discharge: 2016-05-17 | Disposition: A | Discharge status: Home / Self Care | Payer: Medicare Other | Attending: Emergency Medicine | Admitting: Emergency Medicine

## 2016-05-17 ENCOUNTER — Emergency Department (HOSPITAL_COMMUNITY): Payer: Medicare Other

## 2016-05-17 ENCOUNTER — Encounter (HOSPITAL_COMMUNITY): Payer: Self-pay | Admitting: *Deleted

## 2016-05-17 DIAGNOSIS — J45909 Unspecified asthma, uncomplicated: Secondary | ICD-10-CM | POA: Insufficient documentation

## 2016-05-17 DIAGNOSIS — F1410 Cocaine abuse, uncomplicated: Secondary | ICD-10-CM | POA: Diagnosis not present

## 2016-05-17 DIAGNOSIS — F4310 Post-traumatic stress disorder, unspecified: Secondary | ICD-10-CM | POA: Diagnosis not present

## 2016-05-17 DIAGNOSIS — Z79899 Other long term (current) drug therapy: Secondary | ICD-10-CM | POA: Diagnosis not present

## 2016-05-17 DIAGNOSIS — R002 Palpitations: Secondary | ICD-10-CM | POA: Diagnosis present

## 2016-05-17 DIAGNOSIS — I10 Essential (primary) hypertension: Secondary | ICD-10-CM | POA: Diagnosis not present

## 2016-05-17 LAB — I-STAT VENOUS BLOOD GAS, ED
Acid-base deficit: 3 mmol/L — ABNORMAL HIGH (ref 0.0–2.0)
BICARBONATE: 20.2 mmol/L (ref 20.0–28.0)
O2 Saturation: 89 %
PH VEN: 7.443 — AB (ref 7.250–7.430)
PO2 VEN: 53 mmHg — AB (ref 32.0–45.0)
TCO2: 21 mmol/L (ref 0–100)
pCO2, Ven: 29.6 mmHg — ABNORMAL LOW (ref 44.0–60.0)

## 2016-05-17 LAB — ETHANOL: Alcohol, Ethyl (B): <5 mg/dL (ref <5)

## 2016-05-17 LAB — I-STAT CHEM 8, ED
BUN: 4 mg/dL — ABNORMAL LOW (ref 6–20)
CHLORIDE: 101 mmol/L (ref 101–111)
Calcium, Ion: 1.06 mmol/L — ABNORMAL LOW (ref 1.15–1.40)
Creatinine, Ser: 0.8 mg/dL (ref 0.61–1.24)
GLUCOSE: 107 mg/dL — AB (ref 65–99)
HCT: 43 % (ref 39.0–52.0)
Hemoglobin: 14.6 g/dL (ref 13.0–17.0)
POTASSIUM: 3.4 mmol/L — AB (ref 3.5–5.1)
Sodium: 138 mmol/L (ref 135–145)
TCO2: 22 mmol/L (ref 0–100)

## 2016-05-17 LAB — RAPID URINE DRUG SCREEN, HOSP PERFORMED
Amphetamines: NOT DETECTED
BARBITURATES: NOT DETECTED
BENZODIAZEPINES: NOT DETECTED
Cocaine: POSITIVE — AB
Opiates: NOT DETECTED
TETRAHYDROCANNABINOL: NOT DETECTED

## 2016-05-17 LAB — COMPREHENSIVE METABOLIC PANEL
ALK PHOS: 100 U/L (ref 38–126)
ALT: 10 U/L — AB (ref 17–63)
AST: 22 U/L (ref 15–41)
Albumin: 3.7 g/dL (ref 3.5–5.0)
Anion gap: 7 (ref 5–15)
CALCIUM: 8.3 mg/dL — AB (ref 8.9–10.3)
CO2: 20 mmol/L — ABNORMAL LOW (ref 22–32)
CREATININE: 0.89 mg/dL (ref 0.61–1.24)
Chloride: 107 mmol/L (ref 101–111)
Glucose, Bld: 108 mg/dL — ABNORMAL HIGH (ref 65–99)
Potassium: 3.4 mmol/L — ABNORMAL LOW (ref 3.5–5.1)
Sodium: 134 mmol/L — ABNORMAL LOW (ref 135–145)
Total Bilirubin: 0.7 mg/dL (ref 0.3–1.2)
Total Protein: 6.3 g/dL — ABNORMAL LOW (ref 6.5–8.1)

## 2016-05-17 LAB — CBC WITH DIFFERENTIAL/PLATELET
Basophils Absolute: 0 10*3/uL (ref 0.0–0.1)
Basophils Relative: 0 %
EOS PCT: 0 %
Eosinophils Absolute: 0 10*3/uL (ref 0.0–0.7)
HEMATOCRIT: 40.6 % (ref 39.0–52.0)
HEMOGLOBIN: 13.2 g/dL (ref 13.0–17.0)
LYMPHS ABS: 0.9 10*3/uL (ref 0.7–4.0)
LYMPHS PCT: 18 %
MCH: 27 pg (ref 26.0–34.0)
MCHC: 32.5 g/dL (ref 30.0–36.0)
MCV: 83.2 fL (ref 78.0–100.0)
Monocytes Absolute: 0.3 10*3/uL (ref 0.1–1.0)
Monocytes Relative: 6 %
NEUTROS ABS: 3.9 10*3/uL (ref 1.7–7.7)
NEUTROS PCT: 76 %
Platelets: 189 10*3/uL (ref 150–400)
RBC: 4.88 MIL/uL (ref 4.22–5.81)
RDW: 14.8 % (ref 11.5–15.5)
WBC: 5.2 10*3/uL (ref 4.0–10.5)

## 2016-05-17 LAB — ACETAMINOPHEN LEVEL

## 2016-05-17 LAB — I-STAT TROPONIN, ED: Troponin i, poc: 0 ng/mL (ref 0.00–0.08)

## 2016-05-17 LAB — SALICYLATE LEVEL

## 2016-05-17 LAB — CBG MONITORING, ED: GLUCOSE-CAPILLARY: 104 mg/dL — AB (ref 65–99)

## 2016-05-17 MED ORDER — SODIUM CHLORIDE 0.9 % IV BOLUS (SEPSIS)
1000.0000 mL | Freq: Once | INTRAVENOUS | Status: AC
  Administered 2016-05-17: 1000 mL via INTRAVENOUS

## 2016-05-17 MED ORDER — LORAZEPAM 1 MG PO TABS
1.0000 mg | ORAL_TABLET | Freq: Once | ORAL | Status: AC
  Administered 2016-05-17: 1 mg via ORAL
  Filled 2016-05-17: qty 1

## 2016-05-17 MED ORDER — LORAZEPAM 2 MG/ML IJ SOLN
1.0000 mg | Freq: Once | INTRAMUSCULAR | Status: AC
  Administered 2016-05-17: 1 mg via INTRAVENOUS
  Filled 2016-05-17: qty 1

## 2016-05-17 NOTE — ED Notes (Signed)
Pt. Transferred to Room 36

## 2016-05-17 NOTE — BH Assessment (Addendum)
Tele Assessment Note   [name] is a 45 y.o. male who presents voluntarily to [facility] due to chest pains after cocaine use. TTS assessment requested for suspected attempted OD. UDS not completed at time of assessment. Pt indicated that he was upset b/c he called his sister at work yesterday and she told him she would call him back, but didn't. Pt disclosed feeling dejected by her and decided to relapse on cocaine. Pt has a long hx of cocaine use, but has been reportedly sober for 2 months prior to today's use. Pt denies his use being a suicide attempt and disclosed that the amount he used was a "very low" amount. Pt denied SI, HI, or AVH. Pt reported that he has a hx of PTSD and anxiety and hasn't been on medications for @ a year and he would like to get back on medications. Pt is agreeable to receiving a list of OP psychiatrists to follow up with.   Diagnosis: PTSD; GAD  Past Medical History:  Past Medical History:  Diagnosis Date  . Anxiety   . Asthma   . Bipolar 1 disorder (HCC)   . Depression   . Heart palpitations    with severe anxiety  . Hypertension   . PTSD (post-traumatic stress disorder)     Past Surgical History:  Procedure Laterality Date  . NO PAST SURGERIES      Family History: No family history on file.  Social History:  reports that he drinks alcohol. He reports that he does not use drugs. His tobacco history is not on file.  Additional Social History:  Alcohol / Drug Use Pain Medications: pt denies Prescriptions: pt denies Over the Counter: pt denies History of alcohol / drug use?: Yes Longest period of sobriety (when/how long): 2 years Substance #1 Name of Substance 1: cocaine 1 - Amount (size/oz): varies 1 - Frequency: sporadically 1 - Duration: on and off 1 - Last Use / Amount: today/"very low" amount  CIWA: CIWA-Ar BP: 137/93 COWS:    PATIENT STRENGTHS: (choose at least two) Average or above average intelligence Capable of independent  living Motivation for treatment/growth  Allergies: No Known Allergies  Home Medications:  (Not in a hospital admission)  OB/GYN Status:  No LMP for male patient.  General Assessment Data Location of Assessment: [facility] ED TTS Assessment: In system Is this a Tele or Face-to-Face Assessment?: Tele Assessment Is this an Initial Assessment or a Re-assessment for this encounter?: Initial Assessment Marital status: Single Living Arrangements: Alone Can pt return to current living arrangement?: Yes Admission Status: Voluntary Is patient capable of signing voluntary admission?: Yes Referral Source: Self/Family/Friend Insurance type: Medicare     Crisis Care Plan Living Arrangements: Alone Name of Psychiatrist: none Name of Therapist: none  Education Status Is patient currently in school?: No  Risk to self with the past 6 months Suicidal Ideation: No Has patient been a risk to self within the past 6 months prior to admission? : No Suicidal Intent: No Has patient had any suicidal intent within the past 6 months prior to admission? : No Is patient at risk for suicide?: No Suicidal Plan?: No Has patient had any suicidal plan within the past 6 months prior to admission? : No Access to Means: No What has been your use of drugs/alcohol within the last 12 months?: see above Previous Attempts/Gestures: No Intentional Self Injurious Behavior: Cutting Comment - Self Injurious Behavior: hx of cutting- last cut 19 yrs ago Family Suicide History: No  Recent stressful life event(s): Other (Comment) (feels rejected by sister) Persecutory voices/beliefs?: No Depression: No Substance abuse history and/or treatment for substance abuse?: Yes Suicide prevention information given to non-admitted patients: Not applicable  Risk to Others within the past 6 months Homicidal Ideation: No Does patient have any lifetime risk of violence toward others beyond the six months prior to admission? :  No Thoughts of Harm to Others: No Current Homicidal Intent: No Current Homicidal Plan: No Access to Homicidal Means: No History of harm to others?: No Assessment of Violence: None Noted Does patient have access to weapons?: No Criminal Charges Pending?: No Does patient have a court date: No Is patient on probation?: No  Psychosis Hallucinations: None noted Delusions: None noted  Mental Status Report Appearance/Hygiene: Unremarkable Eye Contact: Poor Motor Activity: Unremarkable Speech: Logical/coherent Level of Consciousness: Alert Mood: Empty Affect: Flat Anxiety Level: Minimal Thought Processes: Coherent, Relevant Judgement: Unimpaired Orientation: Person, Place, Time, Situation, Appropriate for developmental age Obsessive Compulsive Thoughts/Behaviors: Unable to Assess  Cognitive Functioning Concentration: Normal Memory: Recent Intact, Remote Intact IQ: Average Insight: see judgement above Impulse Control: Good Appetite: Good Sleep: No Change Vegetative Symptoms: None  ADLScreening [facility]) Patient's cognitive ability adequate to safely complete daily activities?: Yes Patient able to express need for assistance with ADLs?: Yes Independently performs ADLs?: Yes (appropriate for developmental age)  Prior Inpatient Therapy Prior Inpatient Therapy: Yes Prior Therapy Dates: several admission over several years Prior Therapy Facilty/Provider(s): several facilities Reason for Treatment: polysubstance use; MDD  Prior Outpatient Therapy Prior Outpatient Therapy: Yes Prior Therapy Dates: unspecified Prior Therapy Facilty/Provider(s): unspecififed Reason for Treatment: PTSD; MDD; Anxiety Does patient have an ACCT team?: No Does patient have Intensive In-House Services?  : No Does patient have [facility] services? : No Does patient have P4CC services?: No  ADL Screening (condition at time of admission) Patient's cognitive ability adequate to safely  complete daily activities?: Yes Is the patient deaf or have difficulty hearing?: No Does the patient have difficulty seeing, even when wearing glasses/contacts?: No Does the patient have difficulty concentrating, remembering, or making decisions?: No Patient able to express need for assistance with ADLs?: Yes Does the patient have difficulty dressing or bathing?: No Independently performs ADLs?: Yes (appropriate for developmental age) Does the patient have difficulty walking or climbing stairs?: No Weakness of Legs: None Weakness of Arms/Hands: None  Home Assistive Devices/Equipment Home Assistive Devices/Equipment: None  Therapy Consults (therapy consults require a physician order) PT Evaluation Needed: No OT Evalulation Needed: No SLP Evaluation Needed: No Abuse/Neglect Assessment (Assessment to be complete while patient is alone) Physical Abuse: Denies Verbal Abuse: Denies Sexual Abuse: Denies Exploitation of patient/patient's resources: Denies Self-Neglect: Denies Values / Beliefs Cultural Requests During Hospitalization: None Spiritual Requests During Hospitalization: None Consults Spiritual Care Consult Needed: No Social Work Consult Needed: No Merchant navy officerAdvance Directives (For Healthcare) Does patient have an advance directive?: No Would patient like information on creating an advanced directive?: No - patient declined information    Additional Information 1:1 In Past 12 Months?: No CIRT Risk: No Elopement Risk: No Does patient have medical clearance?: Yes     Disposition:  Disposition Initial Assessment Completed for this Encounter: Yes (consulted with Hillery Jacksanika Lewis, NP) Disposition of Patient: Other dispositions Other disposition(s): Information only (pt to be given resources for OP psychiatry)  Laddie AquasSamantha M Brentlee Sciara 05/17/2016 5:13 PM

## 2016-05-17 NOTE — ED Provider Notes (Signed)
[facility]-EMERGENCY DEPT Provider Note   CSN: [id number] Arrival date & time: 05/17/16  1427     History   Chief Complaint Chief Complaint  Patient presents with  . Irregular Heart Beat    HPI [name] is a 45 y.o. male.  HPI 45 year old male with past medical history bipolar disorder who presents with chest pain after using cocaine. Patient is evasive on interview but states that he felt very alone and as though no one was listening to him earlier today. He subsequently went outside and got cocaine then used it approximately 30 minutes prior to arrival. Shortly after using a developed a dull, substernal chest pressure with palpitations. He called EMS. Per EMS report, patient was hypertensive and tachycardic on their arrival. He had frequent PVCs that improved en route. No ST elevations. On initial 12-lead. Currently endorses chest pressure and general fatigue. He feels as though he might pass out. No focal numbness or weakness.  Past Medical History:  Diagnosis Date  . Anxiety   . Asthma   . Bipolar 1 disorder (HCC)   . Depression   . Heart palpitations    with severe anxiety  . Hypertension   . PTSD (post-traumatic stress disorder)     Patient Active Problem List   Diagnosis Date Noted  . Cocaine abuse with cocaine-induced mood disorder (HCC) 01/30/2016  . Substance induced mood disorder (HCC) 04/28/2015  . Major depressive disorder, recurrent, severe without psychotic features (HCC)   . Cocaine dependence with cocaine-induced mood disorder (HCC)   . Recurrent major depression-severe (HCC) 04/23/2015  . Severe recurrent major depression without psychotic features (HCC) 04/13/2015  . Alcohol use disorder, severe, dependence (HCC) 04/12/2015  . Cocaine dependence (HCC) 04/07/2013  . Hypertension 09/08/2011  . Post traumatic stress disorder (PTSD) 09/08/2011    Past Surgical History:  Procedure Laterality Date  . NO PAST SURGERIES         Home Medications     Prior to Admission medications   Medication Sig Start Date End Date Taking? Authorizing Provider  albuterol (PROAIR HFA) 108 (90 Base) MCG/ACT inhaler Inhale 1-2 puffs into the lungs every 6 (six) hours as needed for wheezing or shortness of breath.   Yes Historical Provider, MD  mirtazapine (REMERON) 15 MG tablet Take 15 mg by mouth at bedtime.   Yes Historical Provider, MD  naproxen sodium (ANAPROX) 220 MG tablet Take 220-440 mg by mouth 2 (two) times daily as needed (for headaches).   Yes Historical Provider, MD  acamprosate (CAMPRAL) 333 MG tablet Take 333 mg by mouth 3 (three) times daily. 04/20/15   Historical Provider, MD  busPIRone (BUSPAR) 10 MG tablet Take 1 tablet (10 mg total) by mouth 3 (three) times daily. For anxiety Patient not taking: Reported on 05/17/2016 04/27/15   Sanjuana KavaAgnes I Nwoko, NP  lisinopril (PRINIVIL,ZESTRIL) 20 MG tablet Take 1 tablet (20 mg total) by mouth daily. For high blood pressure Patient not taking: Reported on 05/17/2016 04/27/15   Sanjuana KavaAgnes I Nwoko, NP  mirtazapine (REMERON) 45 MG tablet Take 1 tablet (45 mg total) by mouth at bedtime. For depression Patient not taking: Reported on 05/17/2016 04/27/15   Sanjuana KavaAgnes I Nwoko, NP  prazosin (MINIPRESS) 1 MG capsule Take 1 capsule (1 mg total) by mouth at bedtime. For nighmares Patient not taking: Reported on 05/17/2016 04/27/15   Sanjuana KavaAgnes I Nwoko, NP  zolpidem (AMBIEN) 10 MG tablet Take 1 tablet (10 mg total) by mouth at bedtime as needed for sleep. 04/27/15 06/27/15  Sanjuana Kava, NP    Family History No family history on file.  Social History Social History  Substance Use Topics  . Smoking status: Unknown If Ever Smoked  . Smokeless tobacco: Not on file  . Alcohol use Yes     Comment: Denies ETOH use 06/27/15     Allergies   Review of patient's allergies indicates no known allergies.   Review of Systems Review of Systems  Constitutional: Positive for fatigue. Negative for chills and fever.  HENT: Negative for  congestion and rhinorrhea.   Eyes: Negative for visual disturbance.  Respiratory: Positive for chest tightness. Negative for cough, shortness of breath and wheezing.   Cardiovascular: Positive for palpitations. Negative for chest pain and leg swelling.  Gastrointestinal: Negative for abdominal pain, diarrhea, nausea and vomiting.  Genitourinary: Negative for dysuria and flank pain.  Musculoskeletal: Negative for neck pain and neck stiffness.  Skin: Negative for rash and wound.  Allergic/Immunologic: Negative for immunocompromised state.  Neurological: Negative for syncope, weakness and headaches.  All other systems reviewed and are negative.    Physical Exam Updated Vital Signs BP (!) 151/102   Pulse 88   Resp 21   SpO2 100%   Physical Exam  Constitutional: He is oriented to person, place, and time. He appears well-developed and well-nourished.  HENT:  Head: Normocephalic and atraumatic.  Eyes: Conjunctivae are normal.  Neck: Neck supple.  Cardiovascular: Regular rhythm and normal heart sounds.  Tachycardia present.  Exam reveals no friction rub.   No murmur heard. Pulmonary/Chest: Effort normal and breath sounds normal. No respiratory distress. He has no wheezes. He has no rales.  Abdominal: He exhibits no distension.  Musculoskeletal: He exhibits no edema.  Neurological: He is alert and oriented to person, place, and time. He exhibits normal muscle tone.  Skin: Skin is warm. Capillary refill takes less than 2 seconds.  Psychiatric: He has a normal mood and affect.  Nursing note and vitals reviewed.    ED Treatments / Results  Labs (all labs ordered are listed, but only abnormal results are displayed) Labs Reviewed  COMPREHENSIVE METABOLIC PANEL - Abnormal; Notable for the following:       Result Value   Sodium 134 (*)    Potassium 3.4 (*)    CO2 20 (*)    Glucose, Bld 108 (*)    BUN <5 (*)    Calcium 8.3 (*)    Total Protein 6.3 (*)    ALT 10 (*)    All other  components within normal limits  URINE RAPID DRUG SCREEN, HOSP PERFORMED - Abnormal; Notable for the following:    Cocaine POSITIVE (*)    All other components within normal limits  I-STAT CHEM 8, ED - Abnormal; Notable for the following:    Potassium 3.4 (*)    BUN 4 (*)    Glucose, Bld 107 (*)    Calcium, Ion 1.06 (*)    All other components within normal limits  I-STAT VENOUS BLOOD GAS, ED - Abnormal; Notable for the following:    pH, Ven 7.443 (*)    pCO2, Ven 29.6 (*)    pO2, Ven 53.0 (*)    Acid-base deficit 3.0 (*)    All other components within normal limits  CBG MONITORING, ED - Abnormal; Notable for the following:    Glucose-Capillary 104 (*)    All other components within normal limits  CBC WITH DIFFERENTIAL/PLATELET  ACETAMINOPHEN LEVEL  SALICYLATE LEVEL  ETHANOL  ETHANOL  ACETAMINOPHEN  LEVEL  Rosezena Sensor, ED    EKG  EKG Interpretation  Date/Time:  Saturday May 17 2016 14:31:56 EDT Ventricular Rate:  112 PR Interval:    QRS Duration: 90 QT Interval:  317 QTC Calculation: 433 R Axis:   -17 Text Interpretation:  Sinus tachycardia Multiple ventricular premature complexes Inferior infarct, old Anteroseptal infarct, old Since last tracing PVCs are new No other acute findings Confirmed by Anila Bojarski MD, Zabdiel Dripps [phone]) on 05/17/2016 4:08:54 PM       Radiology Dg Chest Portable 1 View  Result Date: 05/17/2016 CLINICAL DATA:  Chest pain tightness. EXAM: PORTABLE CHEST 1 VIEW COMPARISON:  08/04/2013 FINDINGS: Compared to the prior chest x-ray there is some haziness and loss of definition of the aortic knob. The aorta shows stable tortuosity. This may relate to atelectasis or potentially subtle infiltrate. No edema or pleural fluid. The heart size is normal. IMPRESSION: Density surrounding the aortic knob which may relate to atelectasis or potentially subtle infiltrate. Electronically Signed   By: Irish Lack M.D.   On: 05/17/2016 14:58     Procedures Procedures (including critical care time)  Medications Ordered in ED Medications  LORazepam (ATIVAN) tablet 1 mg (not administered)  sodium chloride 0.9 % bolus 1,000 mL (1,000 mLs Intravenous New Bag/Given 05/17/16 1701)  LORazepam (ATIVAN) injection 1 mg (1 mg Intravenous Given 05/17/16 1701)     Initial Impression / Assessment and Plan / ED Course  I have reviewed the triage vital signs and the nursing notes.  Pertinent labs & imaging results that were available during my care of the patient were reviewed by me and considered in my medical decision making (see chart for details).  Clinical Course  45 year old male with past medical history of bipolar disorder and substance abuse who presents with palpitations and chest pain in the setting of cocaine abuse, with concern for possible desire for self-harm and depression. On arrival here, patient PVCs quickly resolved and patient has been hemodynamically stable. EKG shows no acute ST segment changes. Initial troponin is negative. I suspect his palpitations and chest pain were secondary to cocaine use. He should follow-up with a cardiologist as an outpatient, but is not a candidate for further intervention at this time, given his ongoing cocaine use. Otherwise, regarding his reports of hopelessness and possible purposeful overdose. Will consult. TTS. The patient has multiple scars throughout his bilateral upper extremities consistent with prior self-harm and he has been evaluated by psychiatry in the past. He states he does not currently have a psychiatrist.   TTS has evaluated. Patient does not be IVC or inpatient criteria. They've provided him with outpatient resources. Otherwise, he remains hemodynamically stable and calm. He is not intoxicated on my examination. He is ambulatory without difficulty. Will discharge with instructions to follow up with a primary care doctor, stop cocaine, and follow-up with psychiatry as  discussed.  Final Clinical Impressions(s) / ED Diagnoses   Final diagnoses:  Palpitations  Cocaine abuse  PTSD (post-traumatic stress disorder)    New Prescriptions New Prescriptions   No medications on file     Shaune Pollack, MD 05/17/16 [phone]

## 2016-05-17 NOTE — ED Triage Notes (Signed)
Per EMS- pt ingested cocaine approx 20 minutes ago. Pt is difficult to arouse at times. Pt was noted to be in trigeminy intermittently. Pt reports PTSD. Pt reports chest pain and "feeling like my heart is gonna stop". Pt was reported to have a syncopal episode prior to transport. Pt received of NS en route with 324 mg asprin and 1 nitro.

## 2016-05-17 NOTE — ED Notes (Signed)
tts completed 

## 2016-05-17 NOTE — ED Notes (Signed)
Reported to Dr. Marcello MooresIsaac , pt. Is feeling anxious.

## 2016-05-18 ENCOUNTER — Emergency Department (HOSPITAL_COMMUNITY): Payer: Medicare Other

## 2016-05-18 ENCOUNTER — Emergency Department (HOSPITAL_COMMUNITY)
Admission: EM | Admit: 2016-05-18 | Discharge: 2016-05-18 | Disposition: A | Discharge status: Home / Self Care | Payer: Medicare Other | Attending: Emergency Medicine | Admitting: Emergency Medicine

## 2016-05-18 ENCOUNTER — Encounter (HOSPITAL_COMMUNITY): Payer: Self-pay

## 2016-05-18 DIAGNOSIS — I10 Essential (primary) hypertension: Secondary | ICD-10-CM | POA: Insufficient documentation

## 2016-05-18 DIAGNOSIS — Z79899 Other long term (current) drug therapy: Secondary | ICD-10-CM | POA: Diagnosis not present

## 2016-05-18 DIAGNOSIS — Z791 Long term (current) use of non-steroidal anti-inflammatories (NSAID): Secondary | ICD-10-CM | POA: Diagnosis not present

## 2016-05-18 DIAGNOSIS — M79672 Pain in left foot: Secondary | ICD-10-CM | POA: Insufficient documentation

## 2016-05-18 DIAGNOSIS — J45909 Unspecified asthma, uncomplicated: Secondary | ICD-10-CM | POA: Insufficient documentation

## 2016-05-18 DIAGNOSIS — M79671 Pain in right foot: Secondary | ICD-10-CM | POA: Diagnosis present

## 2016-05-18 MED ORDER — IBUPROFEN 600 MG PO TABS: 600.0000 mg | ORAL_TABLET | Freq: Three times a day (TID) | ORAL | 0 refills | Status: DC

## 2016-05-18 NOTE — ED Provider Notes (Signed)
[facility]-EMERGENCY DEPT Provider Note   CSN: [id number] Arrival date & time: 05/18/16  4098     History   Chief Complaint Chief Complaint  Patient presents with  . Foot Pain    HPI [name] is a 45 y.o. male who presents with bilateral foot pain. He is somewhat of a difficulty historian and tangential. PMH significant for asthma, anxiety, bipolar d/o, substance abuse. At first he states he has been having pain only on the left side. However with more questioning he states he actually has bilateral foot pain. The pain has been going on for "a long time" but he noticed it more today after an increase in activity. He endorses walking up and down steep hills today. Denies injury, hx of gout. Has not tried anything for pain. Of note, he was seen and evaluated in the ED yesterday for what likely was cocaine induced chest pain and depression. TTS was consulted at that time and he did not meet inpatient criteria. He has not expressed any suicidal ideation today.   HPI  Past Medical History:  Diagnosis Date  . Anxiety   . Asthma   . Bipolar 1 disorder (HCC)   . Depression   . Heart palpitations    with severe anxiety  . Hypertension   . PTSD (post-traumatic stress disorder)     Patient Active Problem List   Diagnosis Date Noted  . Major depressive disorder, recurrent, severe without psychotic features (HCC)   . Cocaine dependence with cocaine-induced mood disorder (HCC)   . Alcohol use disorder, severe, dependence (HCC) 04/12/2015  . Hypertension 09/08/2011  . Post traumatic stress disorder (PTSD) 09/08/2011    Past Surgical History:  Procedure Laterality Date  . NO PAST SURGERIES       Home Medications    Prior to Admission medications   Medication Sig Start Date End Date Taking? Authorizing Provider  acamprosate (CAMPRAL) 333 MG tablet Take 333 mg by mouth 3 (three) times daily. 04/20/15  Yes Historical Provider, MD  albuterol (PROAIR HFA) 108 (90 Base) MCG/ACT inhaler  Inhale 1-2 puffs into the lungs every 6 (six) hours as needed for wheezing or shortness of breath.   Yes Historical Provider, MD  mirtazapine (REMERON) 15 MG tablet Take 15 mg by mouth at bedtime.   Yes Historical Provider, MD  naproxen sodium (ANAPROX) 220 MG tablet Take 220-440 mg by mouth 2 (two) times daily as needed (for headaches).   Yes Historical Provider, MD  ibuprofen (ADVIL,MOTRIN) 600 MG tablet Take 1 tablet (600 mg total) by mouth 3 (three) times daily. 05/18/16   Bethel Born, PA-C  zolpidem (AMBIEN) 10 MG tablet Take 1 tablet (10 mg total) by mouth at bedtime as needed for sleep. 04/27/15 06/27/15  Sanjuana Kava, NP    Family History History reviewed. No pertinent family history.  Social History Social History  Substance Use Topics  . Smoking status: Unknown If Ever Smoked  . Smokeless tobacco: Never Used  . Alcohol use Yes     Comment: Denies ETOH use 06/27/15     Allergies   Review of patient's allergies indicates no known allergies.   Review of Systems Review of Systems  Cardiovascular: Negative for chest pain.  Musculoskeletal: Positive for arthralgias. Negative for gait problem.  Psychiatric/Behavioral: Negative for suicidal ideas.     Physical Exam Updated Vital Signs BP 146/90 (BP Location: Left Arm)   Pulse 82   Temp 98.4 F (36.9 C) (Oral)   Resp 16  SpO2 99%   Physical Exam  Constitutional: He is oriented to person, place, and time. He appears well-developed and well-nourished. No distress.  HENT:  Head: Normocephalic and atraumatic.  Eyes: Conjunctivae are normal. Pupils are equal, round, and reactive to light. Right eye exhibits no discharge. Left eye exhibits no discharge. No scleral icterus.  Neck: Normal range of motion. Neck supple.  Cardiovascular: Normal rate and regular rhythm.   No murmur heard. Pulmonary/Chest: Effort normal and breath sounds normal. No respiratory distress.  Abdominal: Soft. He exhibits no distension. There is  no tenderness.  Musculoskeletal: He exhibits no edema.  Left and right foot: No obvious swelling. Some redness over the great toe however no pain with ROM. Hallux valgus deformity bilaterally. FROM of ankles and toes. N/V intact bilaterally   Neurological: He is alert and oriented to person, place, and time.  Skin: Skin is warm and dry.  Psychiatric: He has a normal mood and affect.  Nursing note and vitals reviewed.    ED Treatments / Results  Labs (all labs ordered are listed, but only abnormal results are displayed) Labs Reviewed - No data to display  EKG  EKG Interpretation None       Radiology Dg Chest Portable 1 View  Result Date: 05/17/2016 CLINICAL DATA:  Chest pain tightness. EXAM: PORTABLE CHEST 1 VIEW COMPARISON:  08/04/2013 FINDINGS: Compared to the prior chest x-ray there is some haziness and loss of definition of the aortic knob. The aorta shows stable tortuosity. This may relate to atelectasis or potentially subtle infiltrate. No edema or pleural fluid. The heart size is normal. IMPRESSION: Density surrounding the aortic knob which may relate to atelectasis or potentially subtle infiltrate. Electronically Signed   By: Irish Lack M.D.   On: 05/17/2016 14:58   Dg Foot Complete Left  Result Date: 05/18/2016 CLINICAL DATA:  Patient with pain at the plantar surface of the fifth MTP joint. Open wound. Initial encounter. EXAM: LEFT FOOT - COMPLETE 3+ VIEW COMPARISON:  None. FINDINGS: Normal anatomic alignment. No evidence for acute fracture or dislocation. First MTP joint degenerative changes. Overlying soft tissue swelling about the first MTP joint. IMPRESSION: No acute osseous abnormality. Soft tissue swelling about the first MTP joint. Electronically Signed   By: Annia Belt M.D.   On: 05/18/2016 10:21    Procedures Procedures (including critical care time)  Medications Ordered in ED Medications - No data to display   Initial Impression / Assessment and Plan /  ED Course  I have reviewed the triage vital signs and the nursing notes.  Pertinent labs & imaging results that were available during my care of the patient were reviewed by me and considered in my medical decision making (see chart for details).  Clinical Course   45 year old male with acute on chronic bilateral foot pain likely due to increased activity. Low suspicion for gout or infection. X-ray negative for acute pathology. He has not tried anything for pain. Will d/c with course of Ibuprofen. Patient is NAD, non-toxic, with stable VS. Patient is informed of clinical course, understands medical decision making process, and agrees with plan. Opportunity for questions provided and all questions answered. Return precautions given.   Final Clinical Impressions(s) / ED Diagnoses   Final diagnoses:  Bilateral foot pain    New Prescriptions New Prescriptions   IBUPROFEN (ADVIL,MOTRIN) 600 MG TABLET    Take 1 tablet (600 mg total) by mouth 3 (three) times daily.     Bethel Born, PA-C 05/18/16  1141    Maia PlanJoshua G Long, MD 05/19/16 [phone]

## 2016-05-18 NOTE — ED Triage Notes (Signed)
Pt version:.  Pt walking to store.  Pt walking up and down hills.  Pt foot started hurting and painful.  Left foot.

## 2016-05-18 NOTE — ED Triage Notes (Signed)
Per EMS, pt picked up at gas station.  Pt states he has foot pain to left side.  No injuries or falls.  Pt seen yesterday for cocaine. Denies any substance this morning.  Pt states he has not been asleep b/c he was playing video games. Pt is also a cutter and superficial lacerations to arms which are old and have healed.  Denies SI. Vitals:  140/100, hr 98, resp 24, 96% ra.

## 2016-06-04 ENCOUNTER — Emergency Department (HOSPITAL_COMMUNITY): Payer: Medicare Other

## 2016-06-04 ENCOUNTER — Emergency Department (HOSPITAL_COMMUNITY)
Admission: EM | Admit: 2016-06-04 | Discharge: 2016-06-04 | Disposition: A | Discharge status: Home / Self Care | Payer: Medicare Other | Attending: Emergency Medicine | Admitting: Emergency Medicine

## 2016-06-04 ENCOUNTER — Encounter (HOSPITAL_COMMUNITY): Payer: Self-pay | Admitting: Emergency Medicine

## 2016-06-04 DIAGNOSIS — R002 Palpitations: Secondary | ICD-10-CM | POA: Insufficient documentation

## 2016-06-04 DIAGNOSIS — I10 Essential (primary) hypertension: Secondary | ICD-10-CM | POA: Diagnosis not present

## 2016-06-04 DIAGNOSIS — J45909 Unspecified asthma, uncomplicated: Secondary | ICD-10-CM | POA: Diagnosis not present

## 2016-06-04 LAB — CBC
HCT: 38.2 % — ABNORMAL LOW (ref 39.0–52.0)
HEMOGLOBIN: 12.6 g/dL — AB (ref 13.0–17.0)
MCH: 26.6 pg (ref 26.0–34.0)
MCHC: 33 g/dL (ref 30.0–36.0)
MCV: 80.8 fL (ref 78.0–100.0)
Platelets: 284 10*3/uL (ref 150–400)
RBC: 4.73 MIL/uL (ref 4.22–5.81)
RDW: 15.4 % (ref 11.5–15.5)
WBC: 4.4 10*3/uL (ref 4.0–10.5)

## 2016-06-04 LAB — BASIC METABOLIC PANEL
ANION GAP: 5 (ref 5–15)
BUN: 7 mg/dL (ref 6–20)
CALCIUM: 7.9 mg/dL — AB (ref 8.9–10.3)
CO2: 26 mmol/L (ref 22–32)
Chloride: 105 mmol/L (ref 101–111)
Creatinine, Ser: 0.63 mg/dL (ref 0.61–1.24)
GFR calc non Af Amer: >60 mL/min (ref >60)
GLUCOSE: 96 mg/dL (ref 65–99)
POTASSIUM: 3.2 mmol/L — AB (ref 3.5–5.1)
SODIUM: 136 mmol/L (ref 135–145)

## 2016-06-04 LAB — I-STAT TROPONIN, ED: TROPONIN I, POC: 0.01 ng/mL (ref 0.00–0.08)

## 2016-06-04 NOTE — ED Notes (Signed)
Pt appears anxious, sts that his symptoms are worse since his mother's death. also states having PTSD. Multiple cutting scares on bilateral arms .

## 2016-06-04 NOTE — ED Triage Notes (Addendum)
Patient reports bilateral leg swelling x 5 years; states it has gotten worse in the last 8 months. Patient reports "issues with PTSD" and he feels like his "heart is racing" Reports intermittent "chest sharpness"

## 2016-06-04 NOTE — ED Provider Notes (Signed)
Emergency Department Provider Note   I have reviewed the triage vital signs and the nursing notes.   HISTORY  Chief Complaint Anxiety; Leg Pain; and Palpitations   HPI [name] is a 45 y.o. male with PMH of asthma, bipolar, depression, HTN, and PTSD presents to the emergency department for evaluation of heart palpitations and rapid breathing in the setting of emotional distress. Vision is also concerned about chronic lower extremity edema. The patient states he is having an argument with his brother regarding the death of his mother one year ago. The argument became relatively heated and he began having rapid breathing and heart palpitations. He reports that it's typical of his anxiety symptoms. He denies any chest pain. Symptoms lasted for approximately 30 minutes and resolved spontaneously. He has discontinued his anxiety medication in the last several months.   The patient denies any suicidal or homicidal ideation. He has been thinking more about his own mortality recently but denies any SI.   Past Medical History:  Diagnosis Date  . Anxiety   . Asthma   . Bipolar 1 disorder (HCC)   . Depression   . Heart palpitations    with severe anxiety  . Hypertension   . PTSD (post-traumatic stress disorder)     Patient Active Problem List   Diagnosis Date Noted  . Major depressive disorder, recurrent, severe without psychotic features (HCC)   . Cocaine dependence with cocaine-induced mood disorder (HCC)   . Alcohol use disorder, severe, dependence (HCC) 04/12/2015  . Hypertension 09/08/2011  . Post traumatic stress disorder (PTSD) 09/08/2011    Past Surgical History:  Procedure Laterality Date  . NO PAST SURGERIES      Current Outpatient Rx  . Order #: [id number] Class: Historical Med  . Order #: [id number] Class: Historical Med  . Order #: [id number] Class: Print  . Order #: [id number] Class: Historical Med  . Order #: [id number] Class: Historical Med  . Order #:  [id number] Class: Print    Allergies Review of patient's allergies indicates no known allergies.  No family history on file.  Social History Social History  Substance Use Topics  . Smoking status: Never Smoker  . Smokeless tobacco: Never Used  . Alcohol use Yes     Comment: Denies ETOH use 06/27/15    Review of Systems  Constitutional: No fever/chills Eyes: No visual changes. ENT: No sore throat. Cardiovascular: Denies chest pain. Positive heart palpitations.  Respiratory: Positive shortness of breath. Gastrointestinal: No abdominal pain.  No nausea, no vomiting.  No diarrhea.  No constipation. Genitourinary: Negative for dysuria. Musculoskeletal: Negative for back pain. Skin: Negative for rash. Neurological: Negative for headaches, focal weakness or numbness.  10-point ROS otherwise negative.  ____________________________________________   PHYSICAL EXAM:  VITAL SIGNS: ED Triage Vitals  Enc Vitals Group     BP 06/04/16 1540 (!) 148/102     Pulse Rate 06/04/16 1540 80     Resp 06/04/16 1540 18     Temp 06/04/16 1540 98.6 F (37 C)     Temp Source 06/04/16 1540 Oral     SpO2 06/04/16 1540 95 %     Weight 06/04/16 1542 251 lb (113.9 kg)     Height 06/04/16 1542 5\' 7"  (1.702 m)     Pain Score 06/04/16 1543 9   Constitutional: Alert and oriented. Well appearing and in no acute distress. Eyes: Conjunctivae are normal.  Head: Atraumatic. Nose: No congestion/rhinnorhea. Mouth/Throat: Mucous membranes are moist.  Oropharynx non-erythematous. Neck: No stridor.  Cardiovascular: Normal  rate, regular rhythm. Good peripheral circulation. Grossly normal heart sounds.   Respiratory: Normal respiratory effort.  No retractions. Lungs CTAB. Gastrointestinal: Soft and nontender. No distention.  Musculoskeletal: No lower extremity tenderness nor edema. No gross deformities of extremities. Neurologic:  Normal speech and language. No gross focal neurologic deficits are  appreciated.  Skin:  Skin is warm, dry and intact. Multiple scars to bilateral upper extremities.  Psychiatric: Mood and affect are normal. Speech and behavior are normal.  ____________________________________________   LABS (all labs ordered are listed, but only abnormal results are displayed)  Labs Reviewed  BASIC METABOLIC PANEL - Abnormal; Notable for the following:       Result Value   Potassium 3.2 (*)    Calcium 7.9 (*)    All other components within normal limits  CBC - Abnormal; Notable for the following:    Hemoglobin 12.6 (*)    HCT 38.2 (*)    All other components within normal limits  I-STAT TROPOININ, ED   ____________________________________________  EKG   EKG Interpretation  Date/Time:  Wednesday June 04 2016 16:07:57 EDT Ventricular Rate:  77 PR Interval:    QRS Duration: 104 QT Interval:  385 QTC Calculation: 436 R Axis:   16 Text Interpretation:  Atrial fibrillation Low voltage, precordial leads Probable anteroseptal infarct, old No STEMI.  Similar to prior.  Confirmed by Ellora Varnum MD, Adreona Brand [phone]) on 06/04/2016 4:11:42 PM       ____________________________________________  RADIOLOGY  Dg Chest 2 View  Result Date: 06/04/2016 CLINICAL DATA:  Bilateral leg swelling for many years, worse recently EXAM: CHEST  2 VIEW COMPARISON:  Chest x-ray of 05/17/2016 FINDINGS: Although the aortic knob is more distended, there is prominence of the aortic knob laterally and vague soft tissue just above the aortic knob. Therefore, CT of the chest with IV contrast media is recommended to evaluate further. No pneumonia or effusion is seen. Mild cardiomegaly is stable. The descending thoracic aorta is ectatic. No bony abnormality is seen. IMPRESSION: 1. Lobular appearance of the aortic knob could indicate thoracic aortic aneurysm. Recommend CT angiogram of the chest to assess further. 2. No pneumonia or effusion. 3. Stable cardiomegaly. Electronically Signed   By: Dwyane DeePaul  Barry  M.D.   On: 06/04/2016 16:52    ____________________________________________   PROCEDURES  Procedure(s) performed:   Procedures  None ____________________________________________   INITIAL IMPRESSION / ASSESSMENT AND PLAN / ED COURSE  Pertinent labs & imaging results that were available during my care of the patient were reviewed by me and considered in my medical decision making (see chart for details).  Patient resents to the emergency department for evaluation of heart palpitations and shortness of breath in the setting of an emotionally distressing conversation with his brother. Symptoms have since resolved. The patient is no longer taking his anxiety medication. He is also complaining of bilateral lower extremity swelling for the last several years that is worse over the last 8 months. Swelling is bilateral. Very low suspicion for underlying DVT. Plan to contact the patient with a primary care physician. Reviewed the patient's x-ray. No clear indication for emergent CT scan of the chest to evaluate for thoracic aortic aneurysm as this area was present on chest x-ray during previous ED visit. No chest pain or other clinical signs/symptoms to suggest this diagnosis. He will follow with the primary care physician to decide on any outpatient imaging as needed. Discussed return precautions in detail.  At this time, I do not feel there is  any life-threatening condition present. I have reviewed and discussed all results (EKG, imaging, lab, urine as appropriate), exam findings with patient. I have reviewed nursing notes and appropriate previous records.  I feel the patient is safe to be discharged home without further emergent workup. Discussed usual and customary return precautions. Patient and family (if present) verbalize understanding and are comfortable with this plan.  Patient will follow-up with their primary care provider. If they do not have a primary care provider, information for  follow-up has been provided to them. All questions have been answered.    ____________________________________________  FINAL CLINICAL IMPRESSION(S) / ED DIAGNOSES  Final diagnoses:  Palpitations     MEDICATIONS GIVEN DURING THIS VISIT:  None  NEW OUTPATIENT MEDICATIONS STARTED DURING THIS VISIT:  None   Note:  This document was prepared using Dragon voice recognition software and may include unintentional dictation errors.  Alona Bene, MD Emergency Medicine   Maia Plan, MD 06/04/16 [phone]

## 2016-06-04 NOTE — Discharge Instructions (Signed)

## 2016-06-04 NOTE — ED Triage Notes (Signed)
Per EMS, pt from home.  Pt c/o anxiety and leg pain.  Pt has PTSD.  Vitals: 164/110, hr 90, resp 18, 98% ra

## 2016-06-06 ENCOUNTER — Telehealth (HOSPITAL_BASED_OUTPATIENT_CLINIC_OR_DEPARTMENT_OTHER): Payer: Self-pay

## 2016-08-04 ENCOUNTER — Emergency Department (HOSPITAL_COMMUNITY)
Admission: EM | Admit: 2016-08-04 | Discharge: 2016-08-04 | Disposition: A | Discharge status: Home / Self Care | Payer: Medicare Other | Attending: Emergency Medicine | Admitting: Emergency Medicine

## 2016-08-04 DIAGNOSIS — F410 Panic disorder [episodic paroxysmal anxiety] without agoraphobia: Secondary | ICD-10-CM | POA: Diagnosis not present

## 2016-08-04 DIAGNOSIS — F419 Anxiety disorder, unspecified: Secondary | ICD-10-CM | POA: Insufficient documentation

## 2016-08-04 DIAGNOSIS — J45909 Unspecified asthma, uncomplicated: Secondary | ICD-10-CM | POA: Insufficient documentation

## 2016-08-04 DIAGNOSIS — I10 Essential (primary) hypertension: Secondary | ICD-10-CM | POA: Diagnosis not present

## 2016-08-04 LAB — I-STAT CHEM 8, ED
BUN: 5 mg/dL — AB (ref 6–20)
CALCIUM ION: 1.09 mmol/L — AB (ref 1.15–1.40)
CHLORIDE: 101 mmol/L (ref 101–111)
Creatinine, Ser: 0.8 mg/dL (ref 0.61–1.24)
Glucose, Bld: 92 mg/dL (ref 65–99)
HCT: 49 % (ref 39.0–52.0)
Hemoglobin: 16.7 g/dL (ref 13.0–17.0)
Potassium: 3.3 mmol/L — ABNORMAL LOW (ref 3.5–5.1)
SODIUM: 139 mmol/L (ref 135–145)
TCO2: 24 mmol/L (ref 0–100)

## 2016-08-04 LAB — I-STAT TROPONIN, ED: Troponin i, poc: 0.01 ng/mL (ref 0.00–0.08)

## 2016-08-04 MED ORDER — LORAZEPAM 1 MG PO TABS
1.0000 mg | ORAL_TABLET | Freq: Once | ORAL | Status: AC
  Administered 2016-08-04: 1 mg via ORAL
  Filled 2016-08-04: qty 1

## 2016-08-04 MED ORDER — LORAZEPAM 2 MG/ML IJ SOLN: 1.0000 mg | Freq: Once | INTRAMUSCULAR | Status: DC

## 2016-08-04 MED ORDER — CHLORDIAZEPOXIDE HCL 25 MG PO CAPS
25.0000 mg | ORAL_CAPSULE | Freq: Once | ORAL | Status: AC
  Administered 2016-08-04: 25 mg via ORAL
  Filled 2016-08-04: qty 1

## 2016-08-04 NOTE — ED Triage Notes (Signed)
Per EMS, pt is from home with complaints of panic attacks for several days in the past month. Pt has a hx of PTSD. Pt reports having 3 caffienated beverages this morning and also complaining of chest pain that usually accompanies panic attacks. Pt reports losing his mother recently and having a hard time during the holidays. Pt states he wants psychiatric help. EMS states that pt did not express SI/HI.

## 2016-08-04 NOTE — ED Notes (Signed)
Bed: [facility] Expected date:  Expected time:  Means of arrival:  Comments: EMS-anxiety 

## 2016-08-04 NOTE — Discharge Instructions (Signed)
For your ongoing behavioral health needs, you are advised to follow up with one of the following treatment providers.  Contact them at your earliest opportunity to ask about scheduling an intake appointment:       [facility]      [address]      [place], [place] [address]      [phone]       [facility]      [facility]      [address]      [place], [place] [address]      [phone]       [facility]      [address], [address]      [place], [place] [address]      [phone]

## 2016-08-04 NOTE — BH Assessment (Signed)
[facility] Assessment Progress Note  Per Alberteen SamFran Hobson, FNP, this pt does not require psychiatric hospitalization at this time.  [facility].  Ref[facility] information [facility], for [facility], and for [facility] has been included in pt's discharge instructions.  Pt's nurse has been notified.  Doylene Canninghomas Tresha Muzio, MA Triage Specialist [phone]

## 2016-08-04 NOTE — ED Provider Notes (Signed)
Emergency Department Provider Note   I have reviewed the triage vital signs and the nursing notes.   HISTORY  Chief Complaint Anxiety   HPI [name] is a 45 y.o. male past medical history significant for anxiety, PTSD, panic attacks and hypertension, came to the ED with worsening anxiety for last 2 weeks. Patient states that it this morning, he was having left-sided chest tightness, associated with palpitations, mild diaphoresis and mild dyspnea. He denies any nausea and vomiting ,chest pain,, orthopnea. He do with a complaint of occasional PND. He states that this feels like his usual panic attack. He has history of hypertension used to take lisinopril 10 mg daily, stopped taking his medicine one year ago because of some financial issues. He also states that he was taking BuSpar in the past for anxiety and panic attacks  which were helping him, as he is currently not using any medicine on a regular basis. He denies any headache, change in vision, or any focal deficit.  Patient has an history of heavy alcohol intake, he states that he took 80 ounces of beer yesterday, which was a light drink for him. He denies any smoking or illicit drug use.  Past Medical History:  Diagnosis Date  . Anxiety   . Asthma   . Bipolar 1 disorder (HCC)   . Depression   . Heart palpitations    with severe anxiety  . Hypertension   . PTSD (post-traumatic stress disorder)     Patient Active Problem List   Diagnosis Date Noted  . Major depressive disorder, recurrent, severe without psychotic features (HCC)   . Cocaine dependence with cocaine-induced mood disorder (HCC)   . Alcohol use disorder, severe, dependence (HCC) 04/12/2015  . Hypertension 09/08/2011  . Post traumatic stress disorder (PTSD) 09/08/2011    Past Surgical History:  Procedure Laterality Date  . NO PAST SURGERIES      Current Outpatient Rx  . Order #: [id number] Class: Historical Med    Allergies Patient has no  known allergies.  Family history.Mother was hypertensive and had heart attack at the age of 52.father had some psychological issues and diabetes.  Social History Social History  Substance Use Topics  . Smoking status: Never Smoker  . Smokeless tobacco: Never Used  . Alcohol use Yes     Comment: Denies ETOH use 06/27/15    Review of Systems Constitutional: No fever/chills Eyes: No visual changes. ENT: No sore throat. Cardiovascular: Chest tightness. Palpitations. Respiratory: Mild shortness of breath. Gastrointestinal: No abdominal pain.  No nausea, no vomiting.  No diarrhea.  No constipation. Genitourinary: Negative for dysuria. Musculoskeletal: Negative for back pain. Skin: Negative for rash. Neurological: Negative for headaches, focal weakness or numbness. Psychiatric:Positive for anxiety. 10-point ROS otherwise negative.  ____________________________________________   PHYSICAL EXAM:  VITAL SIGNS: ED Triage Vitals  Enc Vitals Group     BP 08/04/16 0955 (!) 163/106     Pulse Rate 08/04/16 0955 104     Resp 08/04/16 0955 18     Temp --      Temp src --      SpO2 08/04/16 0944 98 %     Weight --      Height --      Head Circumference --      Peak Flow --      Pain Score 08/04/16 0955 6     Pain Loc --      Pain Edu? --      Excl. in  GC? --    Constitutional: Alert and oriented. Well appearing and in no acute distress. Eyes: Conjunctivae are normal. PERRL. EOMI. Head: Atraumatic. Neck: No stridor.  No meningeal signs.  Cardiovascular: Tachycardia, regular rhythm. Good peripheral circulation. Grossly normal heart sounds.   Respiratory: Normal respiratory effort.  No retractions. Lungs CTAB. Gastrointestinal: Soft and nontender. No distention.  Musculoskeletal:Multiple scars on both arms because of self-inflicted injuries in the past, No lower extremity tenderness nor edema. No gross deformities of extremities. Neurologic:  Normal speech and language. No gross  focal neurologic deficits are appreciated.  Skin:  Skin is warm, dry and intact. No rash noted. Psychiatric:Anxious and shaky. ____________________________________________   LABS (all labs ordered are listed, but only abnormal results are displayed)  Labs Reviewed  I-STAT CHEM 8, ED   ____________________________________________  EKG Sinus rhythm  Without any acute change.  ____________________________________________  RADIOLOGY  No results found.  ____________________________________________   PROCEDURES  Procedure(s) performed:   Procedures   ____________________________________________   INITIAL IMPRESSION / ASSESSMENT AND PLAN / ED COURSE  Pertinent labs & imaging results that were available during my care of the patient were reviewed by me and considered in my medical decision making (see chart for details).  He was mildly tachycardic and hypertensive, although it looks like his usual panic attack, we will rule out acute coronary syndrome because of his history of hypertension and family history. We gave him Ativan 1 mg and Librium 25 mg. And reassess after EKG and labs.  His EKG was without any acute change and troponin was negative., He became much calmer, And wanted to be discharged home. He was discharged home. ____________________________________________  FINAL CLINICAL IMPRESSION(S) / ED DIAGNOSES  Final diagnoses:  None     MEDICATIONS GIVEN DURING THIS VISIT:  Medications  chlordiazePOXIDE (LIBRIUM) capsule 25 mg (not administered)  LORazepam (ATIVAN) tablet 1 mg (not administered)     NEW OUTPATIENT MEDICATIONS STARTED DURING THIS VISIT:  New Prescriptions   No medications on file      Note:  This document was prepared using Dragon voice recognition software and may include unintentional dictation errors.  Marcus BeneJoshua Long, MD Emergency Medicine   Marcus CourserSumayya Kenyon Eichelberger, MD 08/05/16 1326    Marcus Planan Floyd, DO 08/05/16 [phone]
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
## 2016-08-30 ENCOUNTER — Encounter (HOSPITAL_COMMUNITY): Payer: Self-pay | Admitting: Emergency Medicine

## 2016-08-30 ENCOUNTER — Emergency Department (HOSPITAL_COMMUNITY)
Admission: EM | Admit: 2016-08-30 | Discharge: 2016-08-30 | Disposition: A | Discharge status: Home / Self Care | Payer: Medicare Other | Attending: Emergency Medicine | Admitting: Emergency Medicine

## 2016-08-30 DIAGNOSIS — F411 Generalized anxiety disorder: Secondary | ICD-10-CM

## 2016-08-30 DIAGNOSIS — I10 Essential (primary) hypertension: Secondary | ICD-10-CM | POA: Diagnosis not present

## 2016-08-30 DIAGNOSIS — J45909 Unspecified asthma, uncomplicated: Secondary | ICD-10-CM | POA: Diagnosis not present

## 2016-08-30 DIAGNOSIS — F419 Anxiety disorder, unspecified: Secondary | ICD-10-CM | POA: Diagnosis not present

## 2016-08-30 DIAGNOSIS — Z79899 Other long term (current) drug therapy: Secondary | ICD-10-CM | POA: Insufficient documentation

## 2016-08-30 LAB — COMPREHENSIVE METABOLIC PANEL
ALT: 23 U/L (ref 17–63)
AST: 32 U/L (ref 15–41)
Albumin: 3.7 g/dL (ref 3.5–5.0)
Alkaline Phosphatase: 146 U/L — ABNORMAL HIGH (ref 38–126)
Anion gap: 14 (ref 5–15)
BUN: 9 mg/dL (ref 6–20)
CO2: 21 mmol/L — ABNORMAL LOW (ref 22–32)
Calcium: 8.7 mg/dL — ABNORMAL LOW (ref 8.9–10.3)
Chloride: 104 mmol/L (ref 101–111)
Creatinine, Ser: 0.98 mg/dL (ref 0.61–1.24)
GFR calc Af Amer: >60 mL/min (ref >60)
GFR calc non Af Amer: >60 mL/min (ref >60)
Glucose, Bld: 86 mg/dL (ref 65–99)
Potassium: 3.5 mmol/L (ref 3.5–5.1)
Sodium: 139 mmol/L (ref 135–145)
Total Bilirubin: 0.6 mg/dL (ref 0.3–1.2)
Total Protein: 7 g/dL (ref 6.5–8.1)

## 2016-08-30 LAB — RAPID URINE DRUG SCREEN, HOSP PERFORMED
Amphetamines: NOT DETECTED
Barbiturates: NOT DETECTED
Benzodiazepines: NOT DETECTED
Cocaine: POSITIVE — AB
Opiates: NOT DETECTED
Tetrahydrocannabinol: NOT DETECTED

## 2016-08-30 LAB — CBC WITH DIFFERENTIAL/PLATELET
Basophils Absolute: 0 10*3/uL (ref 0.0–0.1)
Basophils Relative: 0 %
Eosinophils Absolute: 0 10*3/uL (ref 0.0–0.7)
Eosinophils Relative: 1 %
HCT: 43.4 % (ref 39.0–52.0)
Hemoglobin: 15.1 g/dL (ref 13.0–17.0)
Lymphocytes Relative: 28 %
Lymphs Abs: 1.8 10*3/uL (ref 0.7–4.0)
MCH: 28.1 pg (ref 26.0–34.0)
MCHC: 34.8 g/dL (ref 30.0–36.0)
MCV: 80.7 fL (ref 78.0–100.0)
Monocytes Absolute: 0.4 10*3/uL (ref 0.1–1.0)
Monocytes Relative: 6 %
Neutro Abs: 4.3 10*3/uL (ref 1.7–7.7)
Neutrophils Relative %: 65 %
Platelets: 241 10*3/uL (ref 150–400)
RBC: 5.38 MIL/uL (ref 4.22–5.81)
RDW: 13.4 % (ref 11.5–15.5)
WBC: 6.6 10*3/uL (ref 4.0–10.5)

## 2016-08-30 LAB — ETHANOL: Alcohol, Ethyl (B): <5 mg/dL (ref <5)

## 2016-08-30 MED ORDER — LISINOPRIL 10 MG PO TABS: 10.0000 mg | ORAL_TABLET | Freq: Every day | ORAL | 2 refills | Status: DC

## 2016-08-30 MED ORDER — DIAZEPAM 5 MG PO TABS
10.0000 mg | ORAL_TABLET | Freq: Once | ORAL | Status: AC
  Administered 2016-08-30: 10 mg via ORAL
  Filled 2016-08-30: qty 2

## 2016-08-30 MED ORDER — LORAZEPAM 1 MG PO TABS: 0.5000 mg | ORAL_TABLET | Freq: Three times a day (TID) | ORAL | 0 refills | Status: DC | PRN

## 2016-08-30 MED ORDER — BUSPIRONE HCL 10 MG PO TABS: 10.0000 mg | ORAL_TABLET | Freq: Three times a day (TID) | ORAL | 0 refills | Status: DC
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
## 2016-08-30 NOTE — ED Triage Notes (Signed)
[place] anxiety [place] this [place] , pt. added he has been drinking (ETOH) for the last several days . Denies hallucinations and no suicidal ideations .
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
## 2016-08-30 NOTE — Progress Notes (Signed)
CSW consult acknowledged. CSW following pending TTS assessment and recommendations.    Enos FlingAshley Yousef Huge, MSW, LCSW [name] Clinical Social Worker [phone]

## 2016-08-30 NOTE — ED Notes (Signed)
Pt being TTS at this time  

## 2016-09-04 NOTE — ED Provider Notes (Signed)
[facility]-EMERGENCY DEPT Provider Note   CSN: [id number] Arrival date & time: 08/30/16  0445     History   Chief Complaint Chief Complaint  Patient presents with  . Anxiety    HPI [name] is a 46 y.o. male.  HPI   Pt reports hx of PTSD from abuse and binge drinking, especially around the holidays since his mothers passing in March 2016.  Pt is very call and cooperative.  Physically shaking and prefers to sit farther way from this RN during conversation as if protective of himself.  Pt denies SI or HI but states if he wanted to do that he would just drink himself away.  Pt reports he stopped taking all of his medications approximately 6 months ago after he felt like his PCP was not addressing his anxiety appropriately.  Pt also is having difficulty with resources to afford his medications.  Last drink was yesterday, four 24 hours beers and a 12 pack of beer.  Pt reports last drink before that was months ago.  Trigger was a poor relationship with his sister and his mothers death.  Past Medical History:  Diagnosis Date  . Anxiety   . Asthma   . Bipolar 1 disorder (HCC)   . Depression   . Heart palpitations    with severe anxiety  . Hypertension   . PTSD (post-traumatic stress disorder)     Patient Active Problem List   Diagnosis Date Noted  . Major depressive disorder, recurrent, severe without psychotic features (HCC)   . Cocaine dependence with cocaine-induced mood disorder (HCC)   . Alcohol use disorder, severe, dependence (HCC) 04/12/2015  . Hypertension 09/08/2011  . Post traumatic stress disorder (PTSD) 09/08/2011    Past Surgical History:  Procedure Laterality Date  . NO PAST SURGERIES         Home Medications    Prior to Admission medications   Medication Sig Start Date End Date Taking? Authorizing Provider  diphenhydramine-acetaminophen (TYLENOL PM) 25-500 MG TABS tablet Take 2 tablets by mouth at bedtime as needed (for anxiety/sleep).    Yes Historical  Provider, MD  busPIRone (BUSPAR) 10 MG tablet Take 1 tablet (10 mg total) by mouth 3 (three) times daily. 08/30/16   Raeford Razor, MD  lisinopril (PRINIVIL,ZESTRIL) 10 MG tablet Take 1 tablet (10 mg total) by mouth daily. 08/30/16   Raeford Razor, MD  LORazepam (ATIVAN) 1 MG tablet Take 0.5 tablets (0.5 mg total) by mouth 3 (three) times daily as needed for anxiety. 08/30/16   Raeford Razor, MD    Family History No family history on file.  Social History Social History  Substance Use Topics  . Smoking status: Never Smoker  . Smokeless tobacco: Never Used  . Alcohol use Yes     Allergies   Patient has no known allergies.   Review of Systems Review of Systems   Physical Exam Updated Vital Signs BP (!) 180/117   Pulse 104   Temp 98.8 F (37.1 C) (Oral)   Resp 18   SpO2 98%   Physical Exam  Constitutional: He appears well-developed and well-nourished. No distress.  HENT:  Head: Normocephalic and atraumatic.  Eyes: Conjunctivae are normal. Right eye exhibits no discharge. Left eye exhibits no discharge.  Neck: Neck supple.  Cardiovascular: Regular rhythm and normal heart sounds.  Exam reveals no gallop and no friction rub.   No murmur heard. Mild tachycardia  Pulmonary/Chest: Effort normal and breath sounds normal. No respiratory distress.  Abdominal: Soft.  He exhibits no distension. There is no tenderness.  Musculoskeletal: He exhibits no edema or tenderness.  Neurological: He is alert.  Skin: Skin is warm and dry.  Psychiatric: He has a normal mood and affect. His behavior is normal. Thought content normal.  Nursing note and vitals reviewed.    ED Treatments / Results  Labs (all labs ordered are listed, but only abnormal results are displayed) Labs Reviewed  RAPID URINE DRUG SCREEN, HOSP PERFORMED - Abnormal; Notable for the following:       Result Value   Cocaine POSITIVE (*)    All other components within normal limits  COMPREHENSIVE METABOLIC PANEL -  Abnormal; Notable for the following:    CO2 21 (*)    Calcium 8.7 (*)    Alkaline Phosphatase 146 (*)    All other components within normal limits  ETHANOL  CBC WITH DIFFERENTIAL/PLATELET    EKG  EKG Interpretation None       Radiology No results found.  Procedures Procedures (including critical care time)  Medications Ordered in ED Medications  diazepam (VALIUM) tablet 10 mg (10 mg Oral Given 08/30/16 0835)     Initial Impression / Assessment and Plan / ED Course  I have reviewed the triage vital signs and the nursing notes.  Pertinent labs & imaging results that were available during my care of the patient were reviewed by me and considered in my medical decision making (see chart for details).  Clinical Course      Final Clinical Impressions(s) / ED Diagnoses   Final diagnoses:  Anxiety state    New Prescriptions Discharge Medication List as of 08/30/2016 11:20 AM    START taking these medications   Details  busPIRone (BUSPAR) 10 MG tablet Take 1 tablet (10 mg total) by mouth 3 (three) times daily., Starting Sat 08/30/2016, Print    LORazepam (ATIVAN) 1 MG tablet Take 0.5 tablets (0.5 mg total) by mouth 3 (three) times daily as needed for anxiety., Starting Sat 08/30/2016, Print         Raeford RazorStephen Weiland Tomich, MD 09/04/16 1423

## 2016-10-31 ENCOUNTER — Encounter (HOSPITAL_COMMUNITY): Payer: Self-pay | Admitting: Emergency Medicine

## 2016-10-31 ENCOUNTER — Emergency Department (HOSPITAL_COMMUNITY)
Admission: EM | Admit: 2016-10-31 | Discharge: 2016-11-01 | Disposition: A | Discharge status: Home / Self Care | Payer: Medicare Other | Attending: Emergency Medicine | Admitting: Emergency Medicine

## 2016-10-31 DIAGNOSIS — I10 Essential (primary) hypertension: Secondary | ICD-10-CM | POA: Diagnosis not present

## 2016-10-31 DIAGNOSIS — F419 Anxiety disorder, unspecified: Secondary | ICD-10-CM | POA: Diagnosis present

## 2016-10-31 DIAGNOSIS — Z79899 Other long term (current) drug therapy: Secondary | ICD-10-CM | POA: Diagnosis not present

## 2016-10-31 DIAGNOSIS — F418 Other specified anxiety disorders: Secondary | ICD-10-CM | POA: Insufficient documentation

## 2016-10-31 DIAGNOSIS — J45909 Unspecified asthma, uncomplicated: Secondary | ICD-10-CM | POA: Insufficient documentation

## 2016-10-31 LAB — ACETAMINOPHEN LEVEL

## 2016-10-31 LAB — SALICYLATE LEVEL

## 2016-10-31 LAB — CBC
HCT: 42.9 % (ref 39.0–52.0)
Hemoglobin: 14.5 g/dL (ref 13.0–17.0)
MCH: 27.7 pg (ref 26.0–34.0)
MCHC: 33.8 g/dL (ref 30.0–36.0)
MCV: 82 fL (ref 78.0–100.0)
PLATELETS: 259 10*3/uL (ref 150–400)
RBC: 5.23 MIL/uL (ref 4.22–5.81)
RDW: 14.5 % (ref 11.5–15.5)
WBC: 9.1 10*3/uL (ref 4.0–10.5)

## 2016-10-31 LAB — ETHANOL: ALCOHOL ETHYL (B): 115 mg/dL — AB (ref <5)

## 2016-10-31 LAB — RAPID URINE DRUG SCREEN, HOSP PERFORMED
AMPHETAMINES: NOT DETECTED
BENZODIAZEPINES: NOT DETECTED
Barbiturates: NOT DETECTED
COCAINE: POSITIVE — AB
OPIATES: NOT DETECTED
Tetrahydrocannabinol: NOT DETECTED

## 2016-10-31 NOTE — ED Triage Notes (Addendum)
Pt here for help with PTSD; pt thinks he sees people trying to harm him; denies SI/HI; calm and cooperative

## 2016-11-01 DIAGNOSIS — F418 Other specified anxiety disorders: Secondary | ICD-10-CM | POA: Diagnosis not present

## 2016-11-01 LAB — COMPREHENSIVE METABOLIC PANEL
ALT: 20 U/L (ref 17–63)
ANION GAP: 9 (ref 5–15)
AST: 26 U/L (ref 15–41)
Albumin: 4.5 g/dL (ref 3.5–5.0)
Alkaline Phosphatase: 114 U/L (ref 38–126)
BUN: 9 mg/dL (ref 6–20)
CHLORIDE: 104 mmol/L (ref 101–111)
CO2: 22 mmol/L (ref 22–32)
Calcium: 8.5 mg/dL — ABNORMAL LOW (ref 8.9–10.3)
Creatinine, Ser: 0.75 mg/dL (ref 0.61–1.24)
Glucose, Bld: 100 mg/dL — ABNORMAL HIGH (ref 65–99)
POTASSIUM: 3.2 mmol/L — AB (ref 3.5–5.1)
Sodium: 135 mmol/L (ref 135–145)
TOTAL PROTEIN: 7.9 g/dL (ref 6.5–8.1)
Total Bilirubin: 0.4 mg/dL (ref 0.3–1.2)

## 2016-11-01 MED ORDER — LORAZEPAM 1 MG PO TABS
1.0000 mg | ORAL_TABLET | Freq: Once | ORAL | Status: AC
  Administered 2016-11-01: 1 mg via ORAL
  Filled 2016-11-01: qty 1

## 2016-11-01 MED ORDER — LORAZEPAM 1 MG PO TABS: 0.5000 mg | ORAL_TABLET | Freq: Three times a day (TID) | ORAL | 0 refills | Status: DC | PRN

## 2016-11-01 NOTE — ED Provider Notes (Signed)
[facility]-EMERG[facility]Y DEPT Provider Note   CSN: [id number] Arrival date & time: 10/31/16  2214 By signing my name below, I, Levon Hedger, attest that this documentation has been prepared under the direction and in the presence of non-physician practitioner, .Krosby Ritchie A Zaidan Keeble PA-C. Electronically Signed: Levon Hedger, Scribe. 11/01/2016. 12:24 AM.   History   Chief Complaint Chief Complaint  Patient presents with  . Post-Traumatic Stress Disorder  . Hallucinations   HPI [name] is a 46 y.o. male with a history of anxiety, bipolar 1 disorder, depression, PTSD and cocaine dependence who presents to the Emergency Department complaining of anxiety onset yesterday.  Pt states his father called him yesterday and said something that triggered his anxiety. After speaking to his father, pt began drinking and states he had too much to drink. He describes his anxiety as a feeling of dread and states "it just feels like something bad is going to happen". He reports associated headache and decreased PO intake today. Pt is requesting medication and does not feel as if he needs to speak to a psychiatrist.  Pt was previously on Ativan and Buspar for anxiety, but states he is not taking anything for his anxiety due to issues with his insurance. Per pt, these medications have been effective in treating his anxiety in the past. He denies any alcohol use today. He denies any SI/HI, hallucinations, CP, SOB, urinary complaints, abdominal pain, nausea, or vomiting. Pt has no other complaints or symptoms at this time.   The history is provided by the patient. No language interpreter was used.   Past Medical History:  Diagnosis Date  . Anxiety   . Asthma   . Bipolar 1 disorder (HCC)   . Depression   . Heart palpitations    with severe anxiety  . Hypertension   . PTSD (post-traumatic stress disorder)     Patient Active Problem List   Diagnosis Date Noted  . Major depressive disorder, recurrent, severe without  psychotic features (HCC)   . Cocaine dependence with cocaine-induced mood disorder (HCC)   . Alcohol use disorder, severe, dependence (HCC) 04/12/2015  . Hypertension 09/08/2011  . Post traumatic stress disorder (PTSD) 09/08/2011    Past Surgical History:  Procedure Laterality Date  . NO PAST SURGERIES      Home Medications    Prior to Admission medications   Medication Sig Start Date End Date Taking? Authorizing Provider  busPIRone (BUSPAR) 10 MG tablet Take 1 tablet (10 mg total) by mouth 3 (three) times daily. 08/30/16   Raeford Razor, MD  diphenhydramine-acetaminophen (TYLENOL PM) 25-500 MG TABS tablet Take 2 tablets by mouth at bedtime as needed (for anxiety/sleep).     Historical Provider, MD  lisinopril (PRINIVIL,ZESTRIL) 10 MG tablet Take 1 tablet (10 mg total) by mouth daily. 08/30/16   Raeford Razor, MD  LORazepam (ATIVAN) 1 MG tablet Take 0.5 tablets (0.5 mg total) by mouth 3 (three) times daily as needed for anxiety. 08/30/16   Raeford Razor, MD    Family History No family history on file.  Social History Social History  Substance Use Topics  . Smoking status: Never Smoker  . Smokeless tobacco: Never Used  . Alcohol use Yes     Allergies   Patient has no known allergies.   Review of Systems Review of Systems  Constitutional: Negative for fever.  Eyes: Negative for visual disturbance.  Respiratory: Negative for shortness of breath.   Cardiovascular: Negative for chest pain.  Gastrointestinal: Negative for abdominal pain,  nausea and vomiting.  Genitourinary: Negative.   Allergic/Immunologic: Negative for immunocompromised state.  Neurological: Positive for headaches.  Psychiatric/Behavioral: Negative for hallucinations and suicidal ideas. The patient is nervous/anxious.    Physical Exam Updated Vital Signs BP 129/91 (BP Location: Left Arm)   Pulse 98   Temp 98 F (36.7 C) (Oral)   Resp 18   Ht 5\' 6"  (1.676 m)   Wt 235 lb 1.6 oz (106.6 kg)   SpO2  95%   BMI 37.95 kg/m   Physical Exam  Constitutional: He is oriented to person, place, and time. He appears well-developed and well-nourished. No distress.  HENT:  Head: Normocephalic and atraumatic.  Eyes: Conjunctivae are normal.  Cardiovascular: Normal rate, regular rhythm and normal heart sounds.  Exam reveals no gallop and no friction rub.   No murmur heard. Pulmonary/Chest: Effort normal and breath sounds normal. No respiratory distress. He has no wheezes. He has no rales.  Abdominal: He exhibits no distension.  Neurological: He is alert and oriented to person, place, and time.  Skin: Skin is warm and dry.  Psychiatric: His speech is normal. He is slowed. He exhibits a depressed mood. He expresses no homicidal and no suicidal ideation.  Nursing note and vitals reviewed.  ED Treatments / Results  DIAGNOSTIC STUDIES:  Oxygen Saturation is 95% on RA, adequate by my interpretation.    COORDINATION OF CARE:  12:58 AM Discussed treatment plan with pt at bedside and pt agreed to plan.   Labs (all labs ordered are listed, but only abnormal results are displayed) Labs Reviewed  COMPREHENSIVE METABOLIC PANEL - Abnormal; Notable for the following:       Result Value   Potassium 3.2 (*)    Glucose, Bld 100 (*)    Calcium 8.5 (*)    All other components within normal limits  ETHANOL - Abnormal; Notable for the following:    Alcohol, Ethyl (B) 115 (*)    All other components within normal limits  ACETAMINOPHEN LEVEL - Abnormal; Notable for the following:    Acetaminophen (Tylenol), Serum <10 (*)    All other components within normal limits  RAPID URINE DRUG SCREEN, HOSP PERFORMED - Abnormal; Notable for the following:    Cocaine POSITIVE (*)    All other components within normal limits  SALICYLATE LEVEL  CBC    EKG  EKG Interpretation None       Radiology No results found.  Procedures Procedures (including critical care time)  Medications Ordered in  ED Medications - No data to display   Initial Impression / Assessment and Plan / ED Course  I have reviewed the triage vital signs and the nursing notes.  Pertinent labs & imaging results that were available during my care of the patient were reviewed by me and considered in my medical decision making (see chart for details).     Patient presents with complaint of anxiety that is uncontrolled. He denies auditory or visual hallucinations. He denies SI/HI. He admits to alcohol use yesterday that was "too much". No vomiting. He can be discharged home with resources for outpatient counseling.   Final Clinical Impressions(s) / ED Diagnoses   Final diagnoses:  None   1. Anxiety   New Prescriptions New Prescriptions   No medications on file  I personally performed the services described in this documentation, which was scribed in my presence. The recorded information has been reviewed and is accurate.      Elpidio AnisShari Lamarco Gudiel, PA-C 11/01/16 [phone]  Arby Barrette, MD 11/07/16 [phone]

## 2016-11-21 ENCOUNTER — Encounter (HOSPITAL_COMMUNITY): Payer: Self-pay | Admitting: Emergency Medicine

## 2016-11-21 ENCOUNTER — Emergency Department (HOSPITAL_COMMUNITY)
Admission: EM | Admit: 2016-11-21 | Discharge: 2016-11-22 | Disposition: A | Discharge status: Home / Self Care | Payer: Medicare Other | Attending: Emergency Medicine | Admitting: Emergency Medicine

## 2016-11-21 DIAGNOSIS — J45909 Unspecified asthma, uncomplicated: Secondary | ICD-10-CM | POA: Insufficient documentation

## 2016-11-21 DIAGNOSIS — R000 Tachycardia, unspecified: Secondary | ICD-10-CM

## 2016-11-21 DIAGNOSIS — F1010 Alcohol abuse, uncomplicated: Secondary | ICD-10-CM | POA: Insufficient documentation

## 2016-11-21 DIAGNOSIS — F419 Anxiety disorder, unspecified: Secondary | ICD-10-CM | POA: Diagnosis not present

## 2016-11-21 DIAGNOSIS — Z79899 Other long term (current) drug therapy: Secondary | ICD-10-CM | POA: Insufficient documentation

## 2016-11-21 DIAGNOSIS — F1410 Cocaine abuse, uncomplicated: Secondary | ICD-10-CM | POA: Diagnosis not present

## 2016-11-21 DIAGNOSIS — I10 Essential (primary) hypertension: Secondary | ICD-10-CM | POA: Diagnosis not present

## 2016-11-21 NOTE — ED Triage Notes (Signed)
Per EMS called out and found pt walking from location where call originated from.  Pt was extremely diaphoretic, cool, and clammy at this time.  States he does not use cocaine every day but does use and today may have used more than normal approximately 1 hour ago.  EMS gave approximately 100 mL of fluid and as pt relaxed heart rate improved and diaphoresis resolved.  Pt is anxious upon triage. Denies SI/HI but states depressed over loss of his mother that he never recovered from.

## 2016-11-21 NOTE — ED Provider Notes (Signed)
[facility]-EMERGENCY DEPT Provider Note   CSN: [id number] Arrival date & time: 11/21/16  2117     History   Chief Complaint Chief Complaint  Patient presents with  . Anxiety    HPI [name] is a 46 y.o. male.  The history is provided by the patient.  Anxiety  This is a recurrent problem. The current episode started 1 to 2 hours ago. The problem occurs constantly. The problem has not changed since onset.Pertinent negatives include no chest pain, no abdominal pain, no headaches and no shortness of breath. Nothing aggravates the symptoms. Nothing relieves the symptoms. He has tried nothing for the symptoms.   Reports that he was drinking and doing cocaine earlier today prior to his anxiety attack. Reports that this month is a 2 year anniversary of his mother's death. He denies any suicide ideation, homicide ideation, auditory/visual hallucinations.  He stated that he's been seen previously for anxiety and was prescribed Ativan which she has not filled. He was provided with resources previously for day [facility] and other outpatient counseling which she has not pursued.   Denies any other physical complaints at this time. Past Medical History:  Diagnosis Date  . Anxiety   . Asthma   . Bipolar 1 disorder (HCC)   . Depression   . Heart palpitations    with severe anxiety  . Hypertension   . PTSD (post-traumatic stress disorder)     Patient Active Problem List   Diagnosis Date Noted  . Major depressive disorder, recurrent, severe without psychotic features (HCC)   . Cocaine dependence with cocaine-induced mood disorder (HCC)   . Alcohol use disorder, severe, dependence (HCC) 04/12/2015  . Hypertension 09/08/2011  . Post traumatic stress disorder (PTSD) 09/08/2011    Past Surgical History:  Procedure Laterality Date  . NO PAST SURGERIES         Home Medications    Prior to Admission medications   Medication Sig Start Date End Date Taking? Authorizing Provider    busPIRone (BUSPAR) 10 MG tablet Take 1 tablet (10 mg total) by mouth 3 (three) times daily. 08/30/16   Raeford Razor, MD  diphenhydramine-acetaminophen (TYLENOL PM) 25-500 MG TABS tablet Take 2 tablets by mouth at bedtime as needed (for anxiety/sleep).     Historical Provider, MD  lisinopril (PRINIVIL,ZESTRIL) 10 MG tablet Take 1 tablet (10 mg total) by mouth daily. 08/30/16   Raeford Razor, MD  LORazepam (ATIVAN) 1 MG tablet Take 0.5 tablets (0.5 mg total) by mouth 3 (three) times daily as needed for anxiety. 11/01/16   Elpidio Anis, PA-C    Family History No family history on file.  Social History Social History  Substance Use Topics  . Smoking status: Never Smoker  . Smokeless tobacco: Never Used  . Alcohol use Yes     Allergies   Patient has no known allergies.   Review of Systems Review of Systems  Respiratory: Negative for shortness of breath.   Cardiovascular: Negative for chest pain.  Gastrointestinal: Negative for abdominal pain.  Neurological: Negative for headaches.  Ten systems are reviewed and are negative for acute change except as noted in the HPI    Physical Exam Updated Vital Signs BP (!) 158/101 (BP Location: Left Arm)   Pulse (!) 116   Temp 99 F (37.2 C) (Oral)   Resp 18   SpO2 93%   Physical Exam  Constitutional: He is oriented to person, place, and time. He appears well-developed and well-nourished. No distress.  Clinically  intoxicated.  HENT:  Head: Normocephalic and atraumatic.  Nose: Nose normal.  Eyes: Conjunctivae and EOM are normal. Pupils are equal, round, and reactive to light. Right eye exhibits no discharge. Left eye exhibits no discharge. No scleral icterus.  Neck: Normal range of motion. Neck supple.  Cardiovascular: Regular rhythm.  Tachycardia present.  Exam reveals no gallop and no friction rub.   No murmur heard. Pulmonary/Chest: Effort normal and breath sounds normal. No stridor. No respiratory distress. He has no rales.   Abdominal: Soft. He exhibits no distension. There is no tenderness.  Musculoskeletal: He exhibits no edema or tenderness.  Neurological: He is alert and oriented to person, place, and time.  Skin: Skin is warm and dry. No rash noted. He is not diaphoretic. No erythema.  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  Psychiatric: He has a normal mood and affect.  Vitals reviewed.    ED Treatments / Results  Labs (all labs ordered are listed, but only abnormal results are displayed) Labs Reviewed - No data to display  EKG  EKG Interpretation None       Radiology No results found.  Procedures Procedures (including critical care time)  Medications Ordered in ED Medications - No data to display   Initial Impression / Assessment and Plan / ED Course  I have reviewed the triage vital signs and the nursing notes.  Pertinent labs & imaging results that were available during my care of the patient were reviewed by me and considered in my medical decision making (see chart for details).     Patient is clinically intoxicated. Noted to be tachycardic which is likely secondary to cocaine use. He is currently denying any chest pain or shortness of breath. EKG with sinus tachycardia however no evidence of acute ischemic changes or evidence of pericarditis. Patient is mentating appropriate without focal deficits or external evidence of trauma. No need for labs or imaging at this time. We'll allow patient to metabolize to freedom after which she should be appropriate for discharge.  Patient care turned over to Dr Bebe ShaggyWickline at 1200. Patient case and results discussed in detail; please see their note for further ED managment.      Final Clinical Impressions(s) / ED Diagnoses   Final diagnoses:  Anxiety  Tachycardia  Alcohol abuse  Cocaine abuse      Nira ConnPedro Eduardo Cardama, MD 11/22/16 0010

## 2016-11-22 NOTE — ED Provider Notes (Signed)
Pt stable overnight BP 120/68   Pulse 70   Temp 99 F (37.2 C) (Oral)   Resp 19   SpO2 100%  No acute issues at this time He denies SI He reports he will get his meds refilled and f/u as outpatient    Zadie Rhineonald Landree Fernholz, MD 11/22/16 [phone]

## 2016-11-28 ENCOUNTER — Encounter (HOSPITAL_COMMUNITY): Payer: Self-pay

## 2016-11-28 DIAGNOSIS — J45909 Unspecified asthma, uncomplicated: Secondary | ICD-10-CM | POA: Diagnosis not present

## 2016-11-28 DIAGNOSIS — I10 Essential (primary) hypertension: Secondary | ICD-10-CM | POA: Insufficient documentation

## 2016-11-28 DIAGNOSIS — Z79899 Other long term (current) drug therapy: Secondary | ICD-10-CM | POA: Diagnosis not present

## 2016-11-28 DIAGNOSIS — F10120 Alcohol abuse with intoxication, uncomplicated: Secondary | ICD-10-CM | POA: Insufficient documentation

## 2016-11-28 NOTE — ED Notes (Signed)
Pt stated he had 6 24 oz beers today.

## 2016-11-28 NOTE — ED Notes (Signed)
Pt c/o anxiety and palpitations. Admits to ETOH usage. He keeps saying he drank too much and his heart is racing. Pt is out of anxiety and BP medicine.  Hx of PTSD.

## 2016-11-29 ENCOUNTER — Emergency Department (HOSPITAL_COMMUNITY)
Admission: EM | Admit: 2016-11-29 | Discharge: 2016-11-29 | Disposition: A | Discharge status: Home / Self Care | Payer: Medicare Other | Attending: Emergency Medicine | Admitting: Emergency Medicine

## 2016-11-29 DIAGNOSIS — F1010 Alcohol abuse, uncomplicated: Secondary | ICD-10-CM

## 2016-11-29 DIAGNOSIS — F10120 Alcohol abuse with intoxication, uncomplicated: Secondary | ICD-10-CM | POA: Diagnosis not present

## 2016-11-29 LAB — CBC WITH DIFFERENTIAL/PLATELET
Basophils Absolute: 0 10*3/uL (ref 0.0–0.1)
Basophils Relative: 0 %
Eosinophils Absolute: 0 10*3/uL (ref 0.0–0.7)
Eosinophils Relative: 0 %
HEMATOCRIT: 39.2 % (ref 39.0–52.0)
HEMOGLOBIN: 12.9 g/dL — AB (ref 13.0–17.0)
Lymphocytes Relative: 20 %
Lymphs Abs: 1.9 10*3/uL (ref 0.7–4.0)
MCH: 27.2 pg (ref 26.0–34.0)
MCHC: 32.9 g/dL (ref 30.0–36.0)
MCV: 82.5 fL (ref 78.0–100.0)
MONOS PCT: 7 %
Monocytes Absolute: 0.6 10*3/uL (ref 0.1–1.0)
NEUTROS ABS: 7 10*3/uL (ref 1.7–7.7)
NEUTROS PCT: 73 %
Platelets: 206 10*3/uL (ref 150–400)
RBC: 4.75 MIL/uL (ref 4.22–5.81)
RDW: 13.9 % (ref 11.5–15.5)
WBC: 9.6 10*3/uL (ref 4.0–10.5)

## 2016-11-29 LAB — ACETAMINOPHEN LEVEL: Acetaminophen (Tylenol), Serum: <10 ug/mL — ABNORMAL LOW (ref 10–30)

## 2016-11-29 LAB — BASIC METABOLIC PANEL
Anion gap: 8 (ref 5–15)
BUN: 7 mg/dL (ref 6–20)
CHLORIDE: 107 mmol/L (ref 101–111)
CO2: 24 mmol/L (ref 22–32)
CREATININE: 0.86 mg/dL (ref 0.61–1.24)
Calcium: 8.5 mg/dL — ABNORMAL LOW (ref 8.9–10.3)
GFR calc Af Amer: >60 mL/min (ref >60)
GFR calc non Af Amer: >60 mL/min (ref >60)
GLUCOSE: 96 mg/dL (ref 65–99)
Potassium: 3.7 mmol/L (ref 3.5–5.1)
Sodium: 139 mmol/L (ref 135–145)

## 2016-11-29 LAB — ETHANOL: Alcohol, Ethyl (B): 57 mg/dL — ABNORMAL HIGH (ref <5)

## 2016-11-29 LAB — SALICYLATE LEVEL

## 2016-11-29 MED ORDER — CHLORDIAZEPOXIDE HCL 25 MG PO CAPS: ORAL_CAPSULE | ORAL | 0 refills | Status: DC

## 2016-11-29 MED ORDER — LORAZEPAM 1 MG PO TABS
1.0000 mg | ORAL_TABLET | Freq: Once | ORAL | Status: AC
  Administered 2016-11-29: 1 mg via ORAL
  Filled 2016-11-29: qty 1

## 2016-11-29 NOTE — ED Notes (Signed)
Ambulates independently w/steady gait.

## 2016-11-29 NOTE — ED Provider Notes (Signed)
[facility]-EMERGENCY DEPT Provider Note   CSN: [id number] Arrival date & time: 11/28/16  2255  By signing my name below, I, [name], attest that this documentation has been prepared under the direction and in the presence of Marcus Horseman, PA-C. Electronically Signed: Linna [name], Scribe. 11/29/2016. 1:03 AM.  History   Chief Complaint Chief Complaint  Patient presents with  . Anxiety  . Alcohol Intoxication    The history is provided by the patient. No language interpreter was used.     [name] is a 46 y.o. male with PMHx including anxiety, alcohol abuse, and PTSD who presents to the Emergency Department via EMS with concern for alcohol withdrawal. He states he has recently been drinking eight 24 oz beers daily and his last drink was yesterday afternoon. Patient reports he feels "jittery" and has had tremors d/t not drinking alcohol since yesterday afternoon. He states he wants to pursue an alcohol rehabilitation program. Pt has no other complaints at this time.  Past Medical History:  Diagnosis Date  . Anxiety   . Asthma   . Bipolar 1 disorder (HCC)   . Depression   . Heart palpitations    with severe anxiety  . Hypertension   . PTSD (post-traumatic stress disorder)     Patient Active Problem List   Diagnosis Date Noted  . Major depressive disorder, recurrent, severe without psychotic features (HCC)   . Cocaine dependence with cocaine-induced mood disorder (HCC)   . Alcohol use disorder, severe, dependence (HCC) 04/12/2015  . Hypertension 09/08/2011  . Post traumatic stress disorder (PTSD) 09/08/2011    Past Surgical History:  Procedure Laterality Date  . NO PAST SURGERIES         Home Medications    Prior to Admission medications   Medication Sig Start Date End Date Taking? Authorizing Provider  busPIRone (BUSPAR) 10 MG tablet Take 1 tablet (10 mg total) by mouth 3 (three) times daily. 08/30/16  Yes Raeford Razor, MD  lisinopril (PRINIVIL,ZESTRIL)  10 MG tablet Take 1 tablet (10 mg total) by mouth daily. 08/30/16  Yes Raeford Razor, MD  LORazepam (ATIVAN) 1 MG tablet Take 0.5 tablets (0.5 mg total) by mouth 3 (three) times daily as needed for anxiety. 11/01/16  Yes Elpidio Anis, PA-C    Family History No family history on file.  Social History Social History  Substance Use Topics  . Smoking status: Never Smoker  . Smokeless tobacco: Never Used  . Alcohol use Yes     Allergies   Patient has no known allergies.   Review of Systems Review of Systems  Neurological: Positive for tremors.  Psychiatric/Behavioral: Negative for confusion.  All other systems reviewed and are negative.  Physical Exam Updated Vital Signs BP (!) 161/104 (BP Location: Left Arm)   Pulse (!) 105   Temp 98.2 F (36.8 C) (Oral)   Resp 18   Ht  (1.676 m)   Wt 250 lb (113.4 kg)   SpO2 99%   BMI 40.35 kg/m   Physical Exam  Constitutional: He is oriented to person, place, and time. He appears well-developed and well-nourished.  Non-diaphoretic  HENT:  Head: Normocephalic and atraumatic.  Eyes: EOM are normal.  Neck: Normal range of motion.  Cardiovascular: Normal rate, regular rhythm, normal heart sounds and intact distal pulses.   Pulmonary/Chest: Effort normal and breath sounds normal. No respiratory distress.  Abdominal: Soft. He exhibits no distension. There is no tenderness.  Musculoskeletal: Normal range of motion.  Neurological: He  is alert and oriented to person, place, and time.  Speech is clear No tremor on exam  Skin: Skin is warm and dry.  Psychiatric: He has a normal mood and affect. Judgment normal.  Nursing note and vitals reviewed.  ED Treatments / Results  Labs (all labs ordered are listed, but only abnormal results are displayed) Labs Reviewed  CBC WITH DIFFERENTIAL/PLATELET - Abnormal; Notable for the following:       Result Value   Hemoglobin 12.9 (*)    All other components within normal limits  BASIC  METABOLIC PANEL - Abnormal; Notable for the following:    Calcium 8.5 (*)    All other components within normal limits  ACETAMINOPHEN LEVEL - Abnormal; Notable for the following:    Acetaminophen (Tylenol), Serum <10 (*)    All other components within normal limits  ETHANOL - Abnormal; Notable for the following:    Alcohol, Ethyl (B) 57 (*)    All other components within normal limits  SALICYLATE LEVEL  RAPID URINE DRUG SCREEN, HOSP PERFORMED    EKG  EKG Interpretation  Date/Time:  Friday November 28 2016 23:06:57 EDT Ventricular Rate:  107 PR Interval:    QRS Duration: 104 QT Interval:  364 QTC Calculation: 486 R Axis:   49 Text Interpretation:  Sinus tachycardia Anterior infarct, old Interpretation limited secondary to artifact No significant change since last tracing Confirmed by Bebe Shaggy  MD, DONALD ([id number]) on 11/28/2016 11:21:17 PM       Radiology No results found.  Procedures Procedures (including critical care time)  DIAGNOSTIC STUDIES: Oxygen Saturation is 99% on RA, normal by my interpretation.    COORDINATION OF CARE: 1:08 AM Discussed treatment plan with pt at bedside and pt agreed to plan.  Medications Ordered in ED Medications - No data to display   Initial Impression / Assessment and Plan / ED Course  I have reviewed the triage vital signs and the nursing notes.  Pertinent labs & imaging results that were available during my care of the patient were reviewed by me and considered in my medical decision making (see chart for details).     Patient with alcohol abuse. His last drink was earlier today. His CIWA score at triage is 7.  Will check labs.   4:01 AM Labs are reassuring.  Repeat CIWA is 4.  Patient can be safely discharged to home with outpatient follow-up.  Will give some librium.  Final Clinical Impressions(s) / ED Diagnoses   Final diagnoses:  Alcohol abuse    New Prescriptions New Prescriptions   CHLORDIAZEPOXIDE (LIBRIUM) 25 MG  CAPSULE     PO TID x 1D, then 25-50mg  PO BID X 1D, then 25-50mg  PO QD X 1D   I personally performed the services described in this documentation, which was scribed in my presence. The recorded information has been reviewed and is accurate.      Marcus Horseman, PA-C 11/29/16 1914    Zadie Rhine, MD 11/30/16 [phone]

## 2017-01-13 ENCOUNTER — Emergency Department (HOSPITAL_COMMUNITY)
Admission: EM | Admit: 2017-01-13 | Discharge: 2017-01-13 | Disposition: A | Discharge status: Home / Self Care | Payer: Medicare Other

## 2017-01-13 NOTE — ED Triage Notes (Signed)
Pt called x 2 with no answer  

## 2017-01-18 ENCOUNTER — Encounter (HOSPITAL_COMMUNITY): Payer: Self-pay | Admitting: Emergency Medicine

## 2017-01-18 ENCOUNTER — Emergency Department (HOSPITAL_COMMUNITY)
Admission: EM | Admit: 2017-01-18 | Discharge: 2017-01-18 | Disposition: A | Discharge status: Home / Self Care | Payer: Medicare Other | Attending: Emergency Medicine | Admitting: Emergency Medicine

## 2017-01-18 DIAGNOSIS — R45851 Suicidal ideations: Secondary | ICD-10-CM

## 2017-01-18 DIAGNOSIS — Z79899 Other long term (current) drug therapy: Secondary | ICD-10-CM | POA: Insufficient documentation

## 2017-01-18 DIAGNOSIS — J45909 Unspecified asthma, uncomplicated: Secondary | ICD-10-CM | POA: Insufficient documentation

## 2017-01-18 DIAGNOSIS — I10 Essential (primary) hypertension: Secondary | ICD-10-CM | POA: Insufficient documentation

## 2017-01-18 DIAGNOSIS — F1010 Alcohol abuse, uncomplicated: Secondary | ICD-10-CM

## 2017-01-18 DIAGNOSIS — F1014 Alcohol abuse with alcohol-induced mood disorder: Secondary | ICD-10-CM | POA: Insufficient documentation

## 2017-01-18 LAB — CBC WITH DIFFERENTIAL/PLATELET
BASOS ABS: 0 10*3/uL (ref 0.0–0.1)
BASOS PCT: 0 %
EOS PCT: 6 %
Eosinophils Absolute: 0.4 10*3/uL (ref 0.0–0.7)
HCT: 40.6 % (ref 39.0–52.0)
Hemoglobin: 13.4 g/dL (ref 13.0–17.0)
Lymphocytes Relative: 32 %
Lymphs Abs: 2 10*3/uL (ref 0.7–4.0)
MCH: 27.3 pg (ref 26.0–34.0)
MCHC: 33 g/dL (ref 30.0–36.0)
MCV: 82.9 fL (ref 78.0–100.0)
MONO ABS: 0.4 10*3/uL (ref 0.1–1.0)
Monocytes Relative: 6 %
Neutro Abs: 3.4 10*3/uL (ref 1.7–7.7)
Neutrophils Relative %: 56 %
PLATELETS: 239 10*3/uL (ref 150–400)
RBC: 4.9 MIL/uL (ref 4.22–5.81)
RDW: 13.9 % (ref 11.5–15.5)
WBC: 6.2 10*3/uL (ref 4.0–10.5)

## 2017-01-18 LAB — COMPREHENSIVE METABOLIC PANEL
ALBUMIN: 3.7 g/dL (ref 3.5–5.0)
ALT: 11 U/L — ABNORMAL LOW (ref 17–63)
AST: 17 U/L (ref 15–41)
Alkaline Phosphatase: 123 U/L (ref 38–126)
Anion gap: 11 (ref 5–15)
BUN: 9 mg/dL (ref 6–20)
CHLORIDE: 104 mmol/L (ref 101–111)
CO2: 24 mmol/L (ref 22–32)
Calcium: 8.7 mg/dL — ABNORMAL LOW (ref 8.9–10.3)
Creatinine, Ser: 1.01 mg/dL (ref 0.61–1.24)
GFR calc Af Amer: >60 mL/min (ref >60)
GFR calc non Af Amer: >60 mL/min (ref >60)
GLUCOSE: 102 mg/dL — AB (ref 65–99)
POTASSIUM: 3 mmol/L — AB (ref 3.5–5.1)
SODIUM: 139 mmol/L (ref 135–145)
Total Bilirubin: 0.5 mg/dL (ref 0.3–1.2)
Total Protein: 7 g/dL (ref 6.5–8.1)

## 2017-01-18 LAB — RAPID URINE DRUG SCREEN, HOSP PERFORMED
AMPHETAMINES: NOT DETECTED
BENZODIAZEPINES: NOT DETECTED
Barbiturates: NOT DETECTED
Cocaine: NOT DETECTED
Opiates: NOT DETECTED
TETRAHYDROCANNABINOL: NOT DETECTED

## 2017-01-18 LAB — ETHANOL: ALCOHOL ETHYL (B): 61 mg/dL — AB (ref <5)

## 2017-01-18 MED ORDER — BUSPIRONE HCL 10 MG PO TABS
10.0000 mg | ORAL_TABLET | Freq: Three times a day (TID) | ORAL | Status: DC
  Administered 2017-01-18: 10 mg via ORAL
  Filled 2017-01-18: qty 1

## 2017-01-18 MED ORDER — BUSPIRONE HCL 10 MG PO TABS: 10.0000 mg | ORAL_TABLET | Freq: Three times a day (TID) | ORAL | 0 refills | Status: DC

## 2017-01-18 MED ORDER — GABAPENTIN 300 MG PO CAPS: 300.0000 mg | ORAL_CAPSULE | Freq: Three times a day (TID) | ORAL | 0 refills | Status: DC

## 2017-01-18 MED ORDER — GABAPENTIN 300 MG PO CAPS
300.0000 mg | ORAL_CAPSULE | Freq: Three times a day (TID) | ORAL | Status: DC
  Administered 2017-01-18: 300 mg via ORAL
  Filled 2017-01-18: qty 1

## 2017-01-18 NOTE — [facility] Suicide Risk Assessment (Signed)
Suicide Risk Assessment  Discharge Assessment   [facility]   Principal Problem: Alcohol abuse with alcohol-induced mood disorder [facility](HCC) Discharge Diagnoses:  Patient Active Problem List   Diagnosis Date Noted  . Alcohol abuse with alcohol-induced mood disorder (HCC) [F10.14] 01/18/2017    Priority: High  . Alcohol use disorder, severe, dependence (HCC) [F10.20] 04/12/2015    Priority: High  . Post traumatic stress disorder (PTSD) [F43.10] 09/08/2011    Priority: High  . Cocaine dependence with cocaine-induced mood disorder (HCC) [F14.24]     Priority: Low  . Hypertension [I10] 09/08/2011    Total Time spent with patient: 45 minutes  Musculoskeletal: Strength & Muscle Tone: within normal limits Gait & Station: normal Patient leans: N/A  Psychiatric Specialty Exam:   Blood pressure 115/72, pulse 73, temperature 98.1 F (36.7 C), temperature source Oral, resp. rate 18, SpO2 96 %.There is no height or weight on file to calculate BMI.  General Appearance: Casual  Eye Contact::  Good  Speech:  Normal Rate409  Volume:  Normal  Mood:  Anxious, mild  Affect:  Congruent  Thought Process:  Coherent and Descriptions of Associations: Intact  Orientation:  Full (Time, Place, and Person)  Thought Content:  WDL and Logical  Suicidal Thoughts:  No  Homicidal Thoughts:  No  Memory:  Immediate;   Good Recent;   Good Remote;   Good  Judgement:  Fair  Insight:  Fair  Psychomotor Activity:  Normal  Concentration:  Good  Recall:  Good  Fund of Knowledge:Fair  Language: Good  Akathisia:  No  Handed:  Right  AIMS (if indicated):     Assets:  Leisure Time Physical Health Resilience  Sleep:     Cognition: WNL  ADL's:  Intact   Mental Status Per Nursing Assessment::   On Admission:   alcohol abuse with vague suicidal ideations when he was picked up at a gas station with plan to drink himself to death, BAL 61.  On assessment this am, no suicidal/homicidal  ideations, hallucinations, or withdrawal symptoms.  Anxiety low, long term issue.  Demographic Factors:  Male  Loss Factors: NA  Historical Factors: NA  Risk Reduction Factors:   Sense of responsibility to family, Living with another person, especially a relative and Positive social support  Continued Clinical Symptoms:  Anxiety, mild  Cognitive Features That Contribute To Risk:  None    Suicide Risk:  Minimal: No identifiable suicidal ideation.  Patients presenting with no risk factors but with morbid ruminations; may be classified as minimal risk based on the severity of the depressive symptoms    Plan Of Care/Follow-up recommendations:  Activity:  as tolerated Diet:  heart healhty diet  LORD, JAMISON, NP 01/18/2017, 10:17 AM

## 2017-01-18 NOTE — ED Notes (Signed)
Psychiatry at bedside.

## 2017-01-18 NOTE — ED Notes (Signed)
Pt decided to wait in lobby for bus pass. Will bring one to pt once delivered to department.

## 2017-01-18 NOTE — ED Notes (Signed)
Pt given bus pass. Pt previously had belongings returned
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
## 2017-01-18 NOTE — ED Notes (Signed)
Gave pt resource guide for alcohol treatment and mental health as requested.

## 2017-01-18 NOTE — ED Provider Notes (Signed)
[facility]-EMERGENCY DEPT Provider Note   CSN: [id number] Arrival date & time: 01/18/17  0228   By signing my name below, I, Clarisse Gouge, attest that this documentation has been prepared under the direction and in the presence of Zadie Rhine, MD. Electronically signed, Clarisse Gouge, ED Scribe. 01/18/17. 3:05 AM.   History   Chief Complaint Chief Complaint  Patient presents with  . Suicidal  . Alcohol Intoxication   The history is provided by the patient and medical records. No language interpreter was used.  Alcohol Intoxication  This is a recurrent problem. The current episode started more than 2 days ago. The problem occurs every several days. The problem has not changed since onset.Pertinent negatives include no chest pain, no abdominal pain and no shortness of breath. The symptoms are aggravated by stress.    [name] is a 46 y.o. male BIB EMS who presents to the Emergency Department with concern for recurrent alcohol intoxication x ~ 1 week. Associated worsening SI x < 1 week, nausea and headaches noted. He notes intermittent profuse ETOH use in the past week, last consumed ~30 hours prior to evaluation. Pt with h/o bipolar, PTSD, anxiety, depression, polysubstance abuse including alcohol and cocaine, and HTN. He adds he last used cocaine ~2 weeks ago. No fevers, vomiting. He denies SI and HI on evaluation. No other complaints at this time.   Past Medical History:  Diagnosis Date  . Anxiety   . Asthma   . Bipolar 1 disorder (HCC)   . Depression   . Heart palpitations    with severe anxiety  . Hypertension   . PTSD (post-traumatic stress disorder)     Patient Active Problem List   Diagnosis Date Noted  . Major depressive disorder, recurrent, severe without psychotic features (HCC)   . Cocaine dependence with cocaine-induced mood disorder (HCC)   . Alcohol use disorder, severe, dependence (HCC) 04/12/2015  . Hypertension 09/08/2011  . Post traumatic stress  disorder (PTSD) 09/08/2011    Past Surgical History:  Procedure Laterality Date  . NO PAST SURGERIES         Home Medications    Prior to Admission medications   Medication Sig Start Date End Date Taking? Authorizing Provider  busPIRone (BUSPAR) 10 MG tablet Take 1 tablet (10 mg total) by mouth 3 (three) times daily. 08/30/16   Raeford Razor, MD  chlordiazePOXIDE (LIBRIUM) 25 MG capsule 50mg  PO TID x 1D, then 25-50mg  PO BID X 1D, then 25-50mg  PO QD X 1D 11/29/16   [name], PA-C  lisinopril (PRINIVIL,ZESTRIL) 10 MG tablet Take 1 tablet (10 mg total) by mouth daily. 08/30/16   Raeford Razor, MD  LORazepam (ATIVAN) 1 MG tablet Take 0.5 tablets (0.5 mg total) by mouth 3 (three) times daily as needed for anxiety. 11/01/16   Elpidio Anis, PA-C    Family History History reviewed. No pertinent family history.  Social History Social History  Substance Use Topics  . Smoking status: Never Smoker  . Smokeless tobacco: Never Used  . Alcohol use Yes     Allergies   Patient has no known allergies.   Review of Systems Review of Systems  Respiratory: Negative for shortness of breath.   Cardiovascular: Negative for chest pain.  Gastrointestinal: Negative for abdominal pain.  Psychiatric/Behavioral: Positive for suicidal ideas.  All other systems reviewed and are negative.    Physical Exam Updated Vital Signs BP 105/66 (BP Location: Right Arm)   Pulse 76   Temp 98.1 F (36.7  C) (Oral)   Resp 18   SpO2 94%   Physical Exam CONSTITUTIONAL: Disheveled, no acute distress HEAD: Normocephalic/atraumatic EYES: EOMI ENMT: Mucous membranes moist NECK: supple no meningeal signs CV: S1/S2 noted, no murmurs/rubs/gallops noted LUNGS: Lungs are clear to auscultation bilaterally, no apparent distress ABDOMEN: soft, nontender NEURO: Pt is awake/alert/appropriate, moves all extremitiesx4.  No facial droop.   EXTREMITIES: pulses normal/equal, full ROM SKIN: warm, color normal;  multiple healed scars to BUE's. No recent injuries noted. PSYCH: no abnormalities of mood noted, alert and oriented to situation   ED Treatments / Results  DIAGNOSTIC STUDIES: Oxygen Saturation is 94% on RA, NL by my interpretation.    COORDINATION OF CARE: 3:03 AM-Discussed next steps with pt. Pt verbalized understanding and is agreeable with the plan. Will order blood work and labs.   Labs (all labs ordered are listed, but only abnormal results are displayed) Labs Reviewed  COMPREHENSIVE METABOLIC PANEL - Abnormal; Notable for the following:       Result Value   Potassium 3.0 (*)    Glucose, Bld 102 (*)    Calcium 8.7 (*)    ALT 11 (*)    All other components within normal limits  ETHANOL - Abnormal; Notable for the following:    Alcohol, Ethyl (B) 61 (*)    All other components within normal limits  CBC WITH DIFFERENTIAL/PLATELET  RAPID URINE DRUG SCREEN, HOSP PERFORMED    EKG  EKG Interpretation None       Radiology No results found.  Procedures Procedures    Medications Ordered in ED Medications  busPIRone (BUSPAR) tablet 10 mg (not administered)   5:06 AM Pt medically stable Psych consult   Initial Impression / Assessment and Plan / ED Course  I have reviewed the triage vital signs and the nursing notes.  Pertinent labs & imaging results that were available during my care of the patient were reviewed by me and considered in my medical decision making (see chart for details).     I personally performed the services described in this documentation, which was scribed in my presence. The recorded information has been reviewed and is accurate.    Final Clinical Impressions(s) / ED Diagnoses   Final diagnoses:  Alcohol abuse  Suicidal ideation    New Prescriptions New Prescriptions   No medications on file     Zadie RhineWickline, Akiva Brassfield, MD 01/18/17 [phone]

## 2017-01-18 NOTE — BH Assessment (Signed)
Assessment completed. Consulted Nira ConnJason Berry, FNP who recommended inpatient treatment.

## 2017-01-18 NOTE — ED Notes (Signed)
TTS consult in process at bedside at this time. 
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
## 2017-01-25 ENCOUNTER — Encounter (HOSPITAL_COMMUNITY): Payer: Self-pay | Admitting: Emergency Medicine

## 2017-01-25 ENCOUNTER — Emergency Department (HOSPITAL_COMMUNITY)
Admission: EM | Admit: 2017-01-25 | Discharge: 2017-01-26 | Disposition: A | Discharge status: Home / Self Care | Payer: Medicare Other | Attending: Emergency Medicine | Admitting: Emergency Medicine

## 2017-01-25 DIAGNOSIS — Y92410 Unspecified street and highway as the place of occurrence of the external cause: Secondary | ICD-10-CM | POA: Diagnosis not present

## 2017-01-25 DIAGNOSIS — M79672 Pain in left foot: Secondary | ICD-10-CM | POA: Diagnosis not present

## 2017-01-25 DIAGNOSIS — Y999 Unspecified external cause status: Secondary | ICD-10-CM | POA: Diagnosis not present

## 2017-01-25 DIAGNOSIS — Y939 Activity, unspecified: Secondary | ICD-10-CM | POA: Diagnosis not present

## 2017-01-25 DIAGNOSIS — I10 Essential (primary) hypertension: Secondary | ICD-10-CM | POA: Diagnosis not present

## 2017-01-25 DIAGNOSIS — J45909 Unspecified asthma, uncomplicated: Secondary | ICD-10-CM | POA: Insufficient documentation

## 2017-01-25 DIAGNOSIS — M79605 Pain in left leg: Secondary | ICD-10-CM | POA: Diagnosis present

## 2017-01-25 DIAGNOSIS — S8392XA Sprain of unspecified site of left knee, initial encounter: Secondary | ICD-10-CM | POA: Insufficient documentation
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
## 2017-01-26 ENCOUNTER — Emergency Department (HOSPITAL_COMMUNITY): Payer: Medicare Other

## 2017-01-26 DIAGNOSIS — S8392XA Sprain of unspecified site of left knee, initial encounter: Secondary | ICD-10-CM | POA: Diagnosis not present

## 2017-01-26 MED ORDER — NAPROXEN 500 MG PO TABS: 500.0000 mg | ORAL_TABLET | Freq: Two times a day (BID) | ORAL | 0 refills | Status: DC

## 2017-01-26 MED ORDER — OXYCODONE-ACETAMINOPHEN 5-325 MG PO TABS
1.0000 | ORAL_TABLET | Freq: Once | ORAL | Status: AC
  Administered 2017-01-26: 1 via ORAL
  Filled 2017-01-26: qty 1

## 2017-01-26 NOTE — Discharge Instructions (Signed)
You were seen today for left knee pain and left foot pain. Your x-rays do not show any breaks. You may have a knee sprain, muscle injury, or ligamentous injury. You were placed in a knee immobilizer for comfort. Continue to ice and elevate. If you have persistent pain after some supportive measures for several weeks, you may need an MRI.
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
## 2017-01-26 NOTE — ED Notes (Signed)
Pt understood dc material. NAD noted. Scripts given at dc 

## 2017-01-30 ENCOUNTER — Emergency Department (HOSPITAL_COMMUNITY)
Admission: EM | Admit: 2017-01-30 | Discharge: 2017-01-30 | Disposition: A | Discharge status: Home / Self Care | Payer: Medicare Other | Attending: Emergency Medicine | Admitting: Emergency Medicine

## 2017-01-30 ENCOUNTER — Emergency Department (HOSPITAL_COMMUNITY): Payer: Medicare Other

## 2017-01-30 ENCOUNTER — Encounter (HOSPITAL_COMMUNITY): Payer: Self-pay | Admitting: Nurse Practitioner

## 2017-01-30 DIAGNOSIS — Y999 Unspecified external cause status: Secondary | ICD-10-CM | POA: Insufficient documentation

## 2017-01-30 DIAGNOSIS — X780XXA Intentional self-harm by sharp glass, initial encounter: Secondary | ICD-10-CM | POA: Diagnosis not present

## 2017-01-30 DIAGNOSIS — S61411A Laceration without foreign body of right hand, initial encounter: Secondary | ICD-10-CM | POA: Diagnosis not present

## 2017-01-30 DIAGNOSIS — Y939 Activity, unspecified: Secondary | ICD-10-CM | POA: Insufficient documentation

## 2017-01-30 DIAGNOSIS — I10 Essential (primary) hypertension: Secondary | ICD-10-CM | POA: Diagnosis not present

## 2017-01-30 DIAGNOSIS — J45909 Unspecified asthma, uncomplicated: Secondary | ICD-10-CM | POA: Diagnosis not present

## 2017-01-30 DIAGNOSIS — S61412A Laceration without foreign body of left hand, initial encounter: Secondary | ICD-10-CM | POA: Insufficient documentation

## 2017-01-30 DIAGNOSIS — S59912A Unspecified injury of left forearm, initial encounter: Secondary | ICD-10-CM | POA: Diagnosis present

## 2017-01-30 DIAGNOSIS — Y92039 Unspecified place in apartment as the place of occurrence of the external cause: Secondary | ICD-10-CM | POA: Diagnosis not present

## 2017-01-30 DIAGNOSIS — S41112A Laceration without foreign body of left upper arm, initial encounter: Secondary | ICD-10-CM

## 2017-01-30 DIAGNOSIS — Z79899 Other long term (current) drug therapy: Secondary | ICD-10-CM | POA: Insufficient documentation

## 2017-01-30 DIAGNOSIS — S51812A Laceration without foreign body of left forearm, initial encounter: Secondary | ICD-10-CM | POA: Insufficient documentation

## 2017-01-30 LAB — CBG MONITORING, ED: Glucose-Capillary: 158 mg/dL — ABNORMAL HIGH (ref 65–99)

## 2017-01-30 MED ORDER — LIDOCAINE-EPINEPHRINE (PF) 2 %-1:200000 IJ SOLN
20.0000 mL | Freq: Once | INTRAMUSCULAR | Status: DC
  Filled 2017-01-30: qty 20

## 2017-01-30 MED ORDER — IBUPROFEN 200 MG PO TABS
600.0000 mg | ORAL_TABLET | Freq: Once | ORAL | Status: AC
  Administered 2017-01-30: 600 mg via ORAL
  Filled 2017-01-30: qty 3
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
## 2017-01-30 NOTE — Discharge Instructions (Signed)
Please read instructions below.  Keep your wound clean, dry, and covered. Follow up with your primary care or urgent care for wound recheck in 7 days.  Return to the ER for fever, pus draining from wound, redness, or new or worsening symptoms.
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
## 2017-01-30 NOTE — ED Notes (Signed)
Pt given sandwich & drink. [employer] at bedside. Pt becoming louder.

## 2017-03-01 ENCOUNTER — Encounter (HOSPITAL_COMMUNITY): Payer: Self-pay | Admitting: Emergency Medicine

## 2017-03-01 ENCOUNTER — Emergency Department (HOSPITAL_COMMUNITY)
Admission: EM | Admit: 2017-03-01 | Discharge: 2017-03-01 | Disposition: A | Discharge status: Home / Self Care | Payer: Medicare Other | Attending: Emergency Medicine | Admitting: Emergency Medicine

## 2017-03-01 DIAGNOSIS — F1020 Alcohol dependence, uncomplicated: Secondary | ICD-10-CM | POA: Diagnosis not present

## 2017-03-01 DIAGNOSIS — J45909 Unspecified asthma, uncomplicated: Secondary | ICD-10-CM | POA: Insufficient documentation

## 2017-03-01 DIAGNOSIS — F419 Anxiety disorder, unspecified: Secondary | ICD-10-CM | POA: Insufficient documentation

## 2017-03-01 DIAGNOSIS — I10 Essential (primary) hypertension: Secondary | ICD-10-CM | POA: Diagnosis not present

## 2017-03-01 MED ORDER — CHLORDIAZEPOXIDE HCL 25 MG PO CAPS: ORAL_CAPSULE | ORAL | 0 refills | Status: DC

## 2017-03-01 MED ORDER — LORAZEPAM 1 MG PO TABS
2.0000 mg | ORAL_TABLET | Freq: Once | ORAL | Status: AC
  Administered 2017-03-01: 2 mg via ORAL
  Filled 2017-03-01: qty 2

## 2017-03-01 MED ORDER — LISINOPRIL 10 MG PO TABS: 10.0000 mg | ORAL_TABLET | Freq: Every day | ORAL | 0 refills | Status: DC
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
## 2017-03-01 NOTE — ED Notes (Signed)
[facility] Expected date:  Expected time:  Means of arrival:  Comments: 46 yo anxiety, ETOH
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
## 2017-03-01 NOTE — ED Notes (Signed)
Patient is requesting to leave. Patient has not been evaluated by EDP yet. EDP has been made aware and asked if patient can wait a few minutes so he can talk with him. Patient then asked to use the phone to notify family of him being here.

## 2017-03-10 ENCOUNTER — Emergency Department (HOSPITAL_COMMUNITY): Payer: Medicare Other

## 2017-03-10 ENCOUNTER — Encounter (HOSPITAL_COMMUNITY): Payer: Self-pay | Admitting: Nurse Practitioner

## 2017-03-10 ENCOUNTER — Emergency Department (HOSPITAL_COMMUNITY)
Admission: EM | Admit: 2017-03-10 | Discharge: 2017-03-10 | Disposition: A | Discharge status: Home / Self Care | Payer: Medicare Other | Attending: Emergency Medicine | Admitting: Emergency Medicine

## 2017-03-10 DIAGNOSIS — J45909 Unspecified asthma, uncomplicated: Secondary | ICD-10-CM | POA: Insufficient documentation

## 2017-03-10 DIAGNOSIS — F4310 Post-traumatic stress disorder, unspecified: Secondary | ICD-10-CM | POA: Diagnosis not present

## 2017-03-10 DIAGNOSIS — Z79899 Other long term (current) drug therapy: Secondary | ICD-10-CM | POA: Insufficient documentation

## 2017-03-10 DIAGNOSIS — I10 Essential (primary) hypertension: Secondary | ICD-10-CM | POA: Diagnosis not present

## 2017-03-10 DIAGNOSIS — F319 Bipolar disorder, unspecified: Secondary | ICD-10-CM | POA: Insufficient documentation

## 2017-03-10 DIAGNOSIS — F419 Anxiety disorder, unspecified: Secondary | ICD-10-CM | POA: Insufficient documentation

## 2017-03-10 DIAGNOSIS — R002 Palpitations: Secondary | ICD-10-CM | POA: Diagnosis not present

## 2017-03-10 HISTORY — DX: Unspecified atrial fibrillation: I48.91

## 2017-03-10 LAB — CBC WITH DIFFERENTIAL/PLATELET
Basophils Absolute: 0 10*3/uL (ref 0.0–0.1)
Basophils Relative: 0 %
Eosinophils Absolute: 0.1 10*3/uL (ref 0.0–0.7)
Eosinophils Relative: 1 %
HCT: 42.8 % (ref 39.0–52.0)
Hemoglobin: 14.1 g/dL (ref 13.0–17.0)
Lymphocytes Relative: 31 %
Lymphs Abs: 2 10*3/uL (ref 0.7–4.0)
MCH: 27.6 pg (ref 26.0–34.0)
MCHC: 32.9 g/dL (ref 30.0–36.0)
MCV: 83.9 fL (ref 78.0–100.0)
Monocytes Absolute: 0.4 10*3/uL (ref 0.1–1.0)
Monocytes Relative: 7 %
Neutro Abs: 3.9 10*3/uL (ref 1.7–7.7)
Neutrophils Relative %: 61 %
Platelets: 249 10*3/uL (ref 150–400)
RBC: 5.1 MIL/uL (ref 4.22–5.81)
RDW: 13.7 % (ref 11.5–15.5)
WBC: 6.3 10*3/uL (ref 4.0–10.5)

## 2017-03-10 LAB — BASIC METABOLIC PANEL
Anion gap: 6 (ref 5–15)
BUN: 6 mg/dL (ref 6–20)
CO2: 25 mmol/L (ref 22–32)
Calcium: 8.6 mg/dL — ABNORMAL LOW (ref 8.9–10.3)
Chloride: 108 mmol/L (ref 101–111)
Creatinine, Ser: 0.82 mg/dL (ref 0.61–1.24)
GFR calc Af Amer: >60 mL/min (ref >60)
GFR calc non Af Amer: >60 mL/min (ref >60)
Glucose, Bld: 81 mg/dL (ref 65–99)
Potassium: 3.7 mmol/L (ref 3.5–5.1)
Sodium: 139 mmol/L (ref 135–145)

## 2017-03-10 MED ORDER — LISINOPRIL 10 MG PO TABS: 10.0000 mg | ORAL_TABLET | Freq: Every day | ORAL | 0 refills | Status: DC

## 2017-03-10 MED ORDER — BUSPIRONE HCL 10 MG PO TABS: 10.0000 mg | ORAL_TABLET | Freq: Three times a day (TID) | ORAL | 0 refills | Status: DC

## 2017-03-10 NOTE — ED Notes (Signed)
Patient verbalized understanding of discharge instructions and denies any further needs or questions at this time. VS stable. Patient ambulatory with steady gait, declined wheelchair - escorted patient to ED entrance and provided him with a bus pass. Discussed establishing a PCP with patient using [facility] resources.

## 2017-03-10 NOTE — ED Triage Notes (Signed)
[place] EMS [place] really [place] and anxious [place] to [place] and had panic attack. EMS noted patient diaphoretic and anxious on arrival RR 24. Patient got in cool EMS and felt better. Patient EKG- NSR with PVCs. Lung sounds clear throughout. Patient bilateral radial pulses 2+ . No complaints presently but patient requesting further evaluation.

## 2017-03-10 NOTE — ED Provider Notes (Signed)
[facility]-EMERGENCY DEPT Provider Note   CSN: [id number] Arrival date & time: 03/10/17  1212   By signing my name below, I, Freida Busman, attest that this documentation has been prepared under the direction and in the presence of Raeford Razor, MD . Electronically Signed: Freida Busman, Scribe. 03/10/2017. 2:06 PM.   History   Chief Complaint Chief Complaint  Patient presents with  . Palpitations  . Anxiety    The history is provided by the patient. No language interpreter was used.    HPI Comments:  [name] is a 46 y.o. male with a history of HTN, asthma, and anxiety, who presents to the Emergency Department complaining of "heat exhaustion" today. He states he forgot his wallet at home so he couldn't ride the bus and decided to walk in the heat. He then began to feel short winded, and began sweating profusely. He reports associated palpitations; states his HR. He states he feels better at this time. Pt has no other acute complaints or  symptoms at this time.    Past Medical History:  Diagnosis Date  . Anxiety   . Asthma   . Atrial fibrillation (HCC)   . Bipolar 1 disorder (HCC)   . Depression   . Heart palpitations    with severe anxiety  . Hypertension   . PTSD (post-traumatic stress disorder)     Patient Active Problem List   Diagnosis Date Noted  . Alcohol abuse with alcohol-induced mood disorder (HCC) 01/18/2017  . Cocaine dependence with cocaine-induced mood disorder (HCC)   . Alcohol use disorder, severe, dependence (HCC) 04/12/2015  . Hypertension 09/08/2011  . Post traumatic stress disorder (PTSD) 09/08/2011    Past Surgical History:  Procedure Laterality Date  . NO PAST SURGERIES         Home Medications    Prior to Admission medications   Medication Sig Start Date End Date Taking? Authorizing Provider  busPIRone (BUSPAR) 10 MG tablet Take 1 tablet (10 mg total) by mouth 3 (three) times daily. 01/18/17  Yes Charm Rings, NP  chlordiazePOXIDE  (LIBRIUM) 25 MG capsule 50mg  PO TID x 1D, then 25-50mg  PO BID X 1D, then 25-50mg  PO QD X 1D Patient taking differently: Take 25 mg by mouth daily as needed for anxiety.  03/01/17  Yes Rolland Porter, MD  gabapentin (NEURONTIN) 300 MG capsule Take 1 capsule (300 mg total) by mouth 3 (three) times daily. 01/18/17  Yes Charm Rings, NP  lisinopril (PRINIVIL,ZESTRIL) 10 MG tablet Take 1 tablet (10 mg total) by mouth daily. 03/01/17  Yes Rolland Porter, MD  LORazepam (ATIVAN) 1 MG tablet Take 0.5 tablets (0.5 mg total) by mouth 3 (three) times daily as needed for anxiety. 11/01/16  Yes Upstill, Shari, PA-C  naproxen (NAPROSYN) 500 MG tablet Take 1 tablet (500 mg total) by mouth 2 (two) times daily. 01/26/17  Yes Horton, Mayer Masker, MD    Family History No family history on file.  Social History Social History  Substance Use Topics  . Smoking status: Never Smoker  . Smokeless tobacco: Never Used  . Alcohol use Yes     Allergies   Patient has no known allergies.   Review of Systems Review of Systems  Constitutional: Positive for diaphoresis.  Respiratory: Positive for shortness of breath (resolved).   Cardiovascular: Positive for palpitations (resolved).  All other systems reviewed and are negative.    Physical Exam Updated Vital Signs BP (!) 131/103 (BP Location: Left Arm)   Pulse 89  Temp 97.8 F (36.6 C) (Oral)   Resp 16   Ht 5\' 7"  (1.702 m)   Wt 215 lb (97.5 kg)   SpO2 96%   BMI 33.67 kg/m   Physical Exam  Constitutional: He is oriented to person, place, and time. He appears well-developed and well-nourished.  HENT:  Head: Normocephalic and atraumatic.  Eyes: EOM are normal.  Neck: Normal range of motion.  Cardiovascular: Normal rate, regular rhythm, normal heart sounds and intact distal pulses.   Pulmonary/Chest: Effort normal and breath sounds normal. No respiratory distress.  Abdominal: Soft. He exhibits no distension. There is no tenderness.  Musculoskeletal: Normal  range of motion.  Neurological: He is alert and oriented to person, place, and time.  Skin: Skin is warm and dry.  Psychiatric: He has a normal mood and affect. Judgment normal.  Nursing note and vitals reviewed.    ED Treatments / Results  DIAGNOSTIC STUDIES:  Oxygen Saturation is 96% on RA, normal by my interpretation.    COORDINATION OF CARE:  2:05 PM Discussed treatment plan with pt at bedside and pt agreed to plan.  Labs (all labs ordered are listed, but only abnormal results are displayed) Labs Reviewed - No data to display  EKG  EKG Interpretation  Date/Time:  Tuesday March 10 2017 12:23:48 EDT Ventricular Rate:  91 PR Interval:    QRS Duration: 100 QT Interval:  367 QTC Calculation: 452 R Axis:   -28 Text Interpretation:  Age not entered, assumed to be  46 years old for purpose of ECG interpretation Sinus rhythm Premature ventricular complexes Inferior infarct, old Anterior infarct, old aside from PVCs, similar to previous tracing from 11/28/16 Confirmed by Raeford RazorKohut, Dalon Reichart [phone]) on 03/10/2017 1:11:18 PM       Radiology No results found.  Procedures Procedures (including critical care time)  Medications Ordered in ED Medications - No data to display   Initial Impression / Assessment and Plan / ED Course  I have reviewed the triage vital signs and the nursing notes.  Pertinent labs & imaging results that were available during my care of the patient were reviewed by me and considered in my medical decision making (see chart for details).     Palpitations. EKG w/o concerning changes. HD stable.   Final Clinical Impressions(s) / ED Diagnoses   Final diagnoses:  Palpitations    New Prescriptions New Prescriptions   No medications on file   I personally preformed the services scribed in my presence. The recorded information has been reviewed is accurate. Raeford RazorStephen Vihan Santagata, MD.     Raeford RazorKohut, Rynn Markiewicz, MD 03/12/17 2025

## 2017-03-10 NOTE — ED Notes (Signed)
Provided patient with sprite and graham crackers.

## 2017-03-10 NOTE — ED Notes (Signed)
Pt to xray

## 2017-06-29 ENCOUNTER — Encounter (HOSPITAL_COMMUNITY): Payer: Self-pay | Admitting: *Deleted

## 2017-06-29 ENCOUNTER — Other Ambulatory Visit: Payer: Self-pay

## 2017-06-29 ENCOUNTER — Emergency Department (HOSPITAL_COMMUNITY): Payer: Medicare Other

## 2017-06-29 ENCOUNTER — Emergency Department (HOSPITAL_COMMUNITY)
Admission: EM | Admit: 2017-06-29 | Discharge: 2017-06-29 | Disposition: A | Discharge status: Home / Self Care | Payer: Medicare Other | Attending: Emergency Medicine | Admitting: Emergency Medicine

## 2017-06-29 DIAGNOSIS — Z5321 Procedure and treatment not carried out due to patient leaving prior to being seen by health care provider: Secondary | ICD-10-CM | POA: Diagnosis not present

## 2017-06-29 DIAGNOSIS — R208 Other disturbances of skin sensation: Secondary | ICD-10-CM | POA: Insufficient documentation

## 2017-06-29 DIAGNOSIS — R079 Chest pain, unspecified: Secondary | ICD-10-CM | POA: Diagnosis not present

## 2017-06-29 DIAGNOSIS — F509 Eating disorder, unspecified: Secondary | ICD-10-CM | POA: Insufficient documentation

## 2017-06-29 LAB — BASIC METABOLIC PANEL
ANION GAP: 10 (ref 5–15)
BUN: <5 mg/dL — ABNORMAL LOW (ref 6–20)
CO2: 22 mmol/L (ref 22–32)
Calcium: 8.9 mg/dL (ref 8.9–10.3)
Chloride: 105 mmol/L (ref 101–111)
Creatinine, Ser: 0.81 mg/dL (ref 0.61–1.24)
GFR calc non Af Amer: >60 mL/min (ref >60)
Glucose, Bld: 89 mg/dL (ref 65–99)
Potassium: 3.8 mmol/L (ref 3.5–5.1)
SODIUM: 137 mmol/L (ref 135–145)

## 2017-06-29 LAB — CBC
HCT: 41.5 % (ref 39.0–52.0)
HEMOGLOBIN: 14.1 g/dL (ref 13.0–17.0)
MCH: 27.6 pg (ref 26.0–34.0)
MCHC: 34 g/dL (ref 30.0–36.0)
MCV: 81.2 fL (ref 78.0–100.0)
Platelets: 348 10*3/uL (ref 150–400)
RBC: 5.11 MIL/uL (ref 4.22–5.81)
RDW: 14 % (ref 11.5–15.5)
WBC: 5.2 10*3/uL (ref 4.0–10.5)

## 2017-06-29 LAB — I-STAT TROPONIN, ED: TROPONIN I, POC: 0 ng/mL (ref 0.00–0.08)

## 2017-06-29 NOTE — ED Notes (Signed)
Pt in Triage.  

## 2017-06-29 NOTE — ED Notes (Signed)
Pt. Did not respond when name was called multiple times in the waiting room (was also looked for in triage).

## 2017-06-29 NOTE — ED Triage Notes (Signed)
Pt has hx of PTSD and panic attacks.  States was getting upset b/c a package didn't arrive and he began experiencing chest tightness, cool hands, etc.  Pt states he has not eaten in a few days d/t stress.  bp 191/95, hr 78, rr 18. 100% ra.  cbg 92.  Given 324 asa en-route.  EKG nsr.

## 2017-11-30 IMAGING — CR DG CHEST 1V PORT
1 series · 1 of 1 positions shown · non-contrast
Comparison: 08/04/2013

CLINICAL DATA: Chest pain tightness.

EXAM:
PORTABLE CHEST 1 VIEW

[AP]
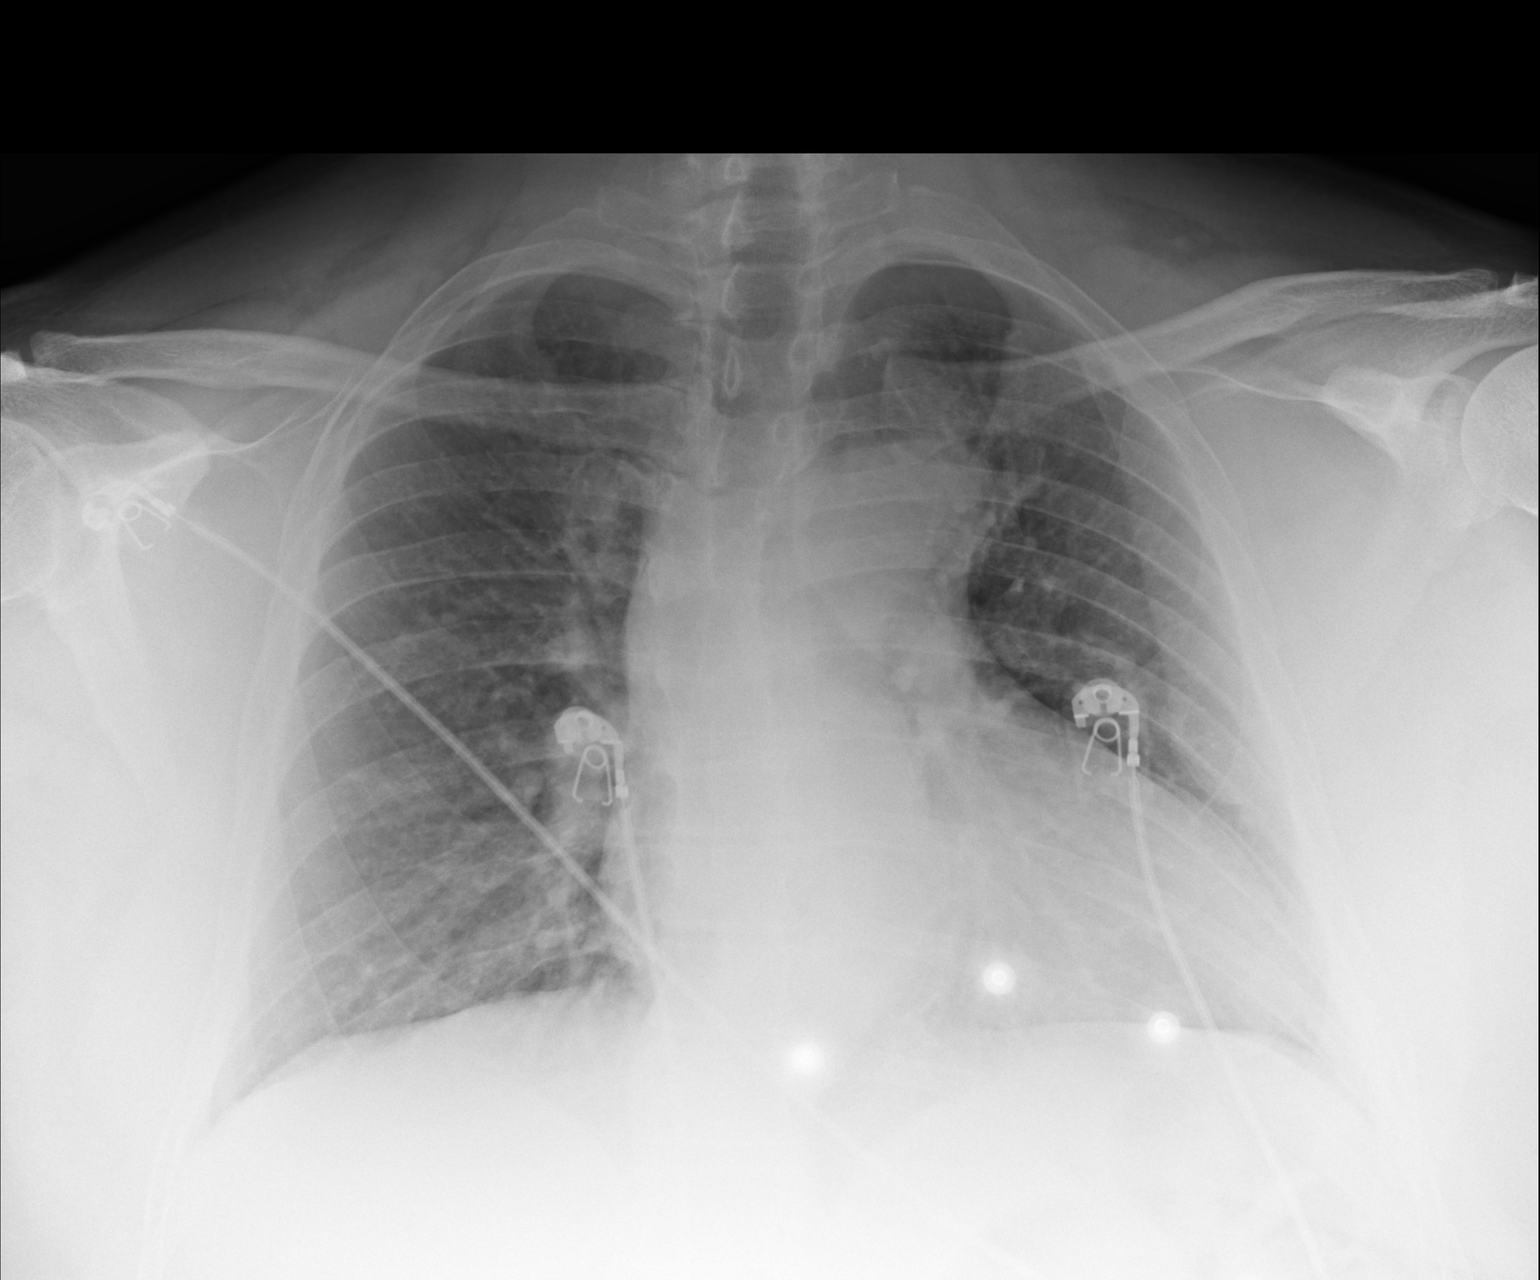

[1 of 1 positions shown; findings below may reference images not displayed]

FINDINGS: Compared to the prior chest x-ray there is some haziness and loss of
definition of the aortic knob. The aorta shows stable tortuosity.
This may relate to atelectasis or potentially subtle infiltrate. No
edema or pleural fluid. The heart size is normal.
IMPRESSION: Density surrounding the aortic knob which may relate to atelectasis
or potentially subtle infiltrate.

## 2019-07-16 ENCOUNTER — Emergency Department (HOSPITAL_COMMUNITY)
Admission: EM | Admit: 2019-07-16 | Discharge: 2019-07-16 | Disposition: A | Discharge status: Home / Self Care | Payer: Medicare Other | Attending: Emergency Medicine | Admitting: Emergency Medicine

## 2019-07-16 ENCOUNTER — Other Ambulatory Visit: Payer: Self-pay

## 2019-07-16 ENCOUNTER — Encounter (HOSPITAL_COMMUNITY): Payer: Self-pay | Admitting: Emergency Medicine

## 2019-07-16 DIAGNOSIS — I10 Essential (primary) hypertension: Secondary | ICD-10-CM | POA: Diagnosis not present

## 2019-07-16 DIAGNOSIS — Y903 Blood alcohol level of 60-79 mg/100 ml: Secondary | ICD-10-CM | POA: Diagnosis not present

## 2019-07-16 DIAGNOSIS — J45909 Unspecified asthma, uncomplicated: Secondary | ICD-10-CM | POA: Diagnosis not present

## 2019-07-16 DIAGNOSIS — F10230 Alcohol dependence with withdrawal, uncomplicated: Secondary | ICD-10-CM | POA: Diagnosis not present

## 2019-07-16 DIAGNOSIS — F419 Anxiety disorder, unspecified: Secondary | ICD-10-CM | POA: Diagnosis not present

## 2019-07-16 DIAGNOSIS — F10930 Alcohol use, unspecified with withdrawal, uncomplicated: Secondary | ICD-10-CM

## 2019-07-16 DIAGNOSIS — E860 Dehydration: Secondary | ICD-10-CM | POA: Diagnosis not present

## 2019-07-16 LAB — CBC WITH DIFFERENTIAL/PLATELET
Abs Immature Granulocytes: 0.02 10*3/uL (ref 0.00–0.07)
Basophils Absolute: 0 10*3/uL (ref 0.0–0.1)
Basophils Relative: 1 %
Eosinophils Absolute: 0 10*3/uL (ref 0.0–0.5)
Eosinophils Relative: 0 %
HCT: 44.1 % (ref 39.0–52.0)
Hemoglobin: 14.3 g/dL (ref 13.0–17.0)
Immature Granulocytes: 0 %
Lymphocytes Relative: 17 %
Lymphs Abs: 1 10*3/uL (ref 0.7–4.0)
MCH: 28.2 pg (ref 26.0–34.0)
MCHC: 32.4 g/dL (ref 30.0–36.0)
MCV: 87 fL (ref 80.0–100.0)
Monocytes Absolute: 0.5 10*3/uL (ref 0.1–1.0)
Monocytes Relative: 9 %
Neutro Abs: 4.4 10*3/uL (ref 1.7–7.7)
Neutrophils Relative %: 73 %
Platelets: 246 10*3/uL (ref 150–400)
RBC: 5.07 MIL/uL (ref 4.22–5.81)
RDW: 13.9 % (ref 11.5–15.5)
WBC: 6 10*3/uL (ref 4.0–10.5)
nRBC: 0 % (ref 0.0–0.2)

## 2019-07-16 LAB — COMPREHENSIVE METABOLIC PANEL
ALT: 26 U/L (ref 0–44)
AST: 26 U/L (ref 15–41)
Albumin: 4.3 g/dL (ref 3.5–5.0)
Alkaline Phosphatase: 115 U/L (ref 38–126)
Anion gap: 10 (ref 5–15)
BUN: 6 mg/dL (ref 6–20)
CO2: 22 mmol/L (ref 22–32)
Calcium: 8.3 mg/dL — ABNORMAL LOW (ref 8.9–10.3)
Chloride: 106 mmol/L (ref 98–111)
Creatinine, Ser: 0.68 mg/dL (ref 0.61–1.24)
GFR calc Af Amer: >60 mL/min (ref >60)
GFR calc non Af Amer: >60 mL/min (ref >60)
Glucose, Bld: 111 mg/dL — ABNORMAL HIGH (ref 70–99)
Potassium: 3.6 mmol/L (ref 3.5–5.1)
Sodium: 138 mmol/L (ref 135–145)
Total Bilirubin: 0.7 mg/dL (ref 0.3–1.2)
Total Protein: 7.7 g/dL (ref 6.5–8.1)

## 2019-07-16 LAB — RAPID URINE DRUG SCREEN, HOSP PERFORMED
Amphetamines: NOT DETECTED
Barbiturates: NOT DETECTED
Benzodiazepines: NOT DETECTED
Cocaine: NOT DETECTED
Opiates: NOT DETECTED
Tetrahydrocannabinol: NOT DETECTED

## 2019-07-16 LAB — URINALYSIS, ROUTINE W REFLEX MICROSCOPIC
Bilirubin Urine: NEGATIVE
Glucose, UA: NEGATIVE mg/dL
Hgb urine dipstick: NEGATIVE
Ketones, ur: NEGATIVE mg/dL
Leukocytes,Ua: NEGATIVE
Nitrite: NEGATIVE
Protein, ur: NEGATIVE mg/dL
Specific Gravity, Urine: 1.011 (ref 1.005–1.030)
pH: 8 (ref 5.0–8.0)

## 2019-07-16 LAB — ETHANOL: Alcohol, Ethyl (B): 56 mg/dL — ABNORMAL HIGH (ref <10)

## 2019-07-16 MED ORDER — HYDROXYZINE HCL 25 MG PO TABS: 25.0000 mg | ORAL_TABLET | Freq: Three times a day (TID) | ORAL | 0 refills | Status: DC | PRN

## 2019-07-16 MED ORDER — SODIUM CHLORIDE 0.9 % IV BOLUS
1000.0000 mL | Freq: Once | INTRAVENOUS | Status: AC
  Administered 2019-07-16: 1000 mL via INTRAVENOUS

## 2019-07-16 MED ORDER — LORAZEPAM 2 MG/ML IJ SOLN
1.0000 mg | Freq: Once | INTRAMUSCULAR | Status: AC
  Administered 2019-07-16: 1 mg via INTRAVENOUS
  Filled 2019-07-16: qty 1

## 2019-07-16 MED ORDER — CHLORDIAZEPOXIDE HCL 25 MG PO CAPS: ORAL_CAPSULE | ORAL | 0 refills | Status: DC

## 2019-07-16 MED ORDER — HYDROXYZINE HCL 25 MG PO TABS
25.0000 mg | ORAL_TABLET | Freq: Once | ORAL | Status: AC
Start: 2019-07-16 — End: 2019-07-16
  Administered 2019-07-16: 25 mg via ORAL
  Filled 2019-07-16: qty 1

## 2019-07-16 NOTE — ED Notes (Signed)
DENIES SI/HI. HERE FOR WITHDRAWS. LAST ETOH DRINK WAS 0700.  8-12 BEERS /DAILY

## 2019-07-16 NOTE — ED Provider Notes (Signed)
[facility]-EMERGENCY DEPT Provider Note   CSN: [id number] Arrival date & time: 07/16/19  1510     History   Chief Complaint Chief Complaint  Patient presents with  . Alcohol Intoxication  . Anxiety    HPI [name] is a 48 y.o. male presenting for evaluation of anxiety and alcohol withdrawal symptoms.  Patient states he does not have a problem with alcohol, but sometimes he drinks too much.  He has been drinking too much for the past 3 to 4 days.  He stopped drinking at 6 AM this morning, and since then has been feeling poorly.  He reports feeling very anxious, states he has been having a panic attack.  He also states he is feeling very shaky.  Patient states he has gone off alcohol before, he has had the shakes.  Patient states he has had seizures, although on further investigation his symptoms are more consistent with tremors than true seizures.  He reports generalized body aches.  He denies chest pain, shortness of breath, nausea, vomiting, domino pain.  Patient reports decreased urination.  No diarrhea.  He states he is not currently taking any medication, has been over a year since he was on any psychiatric medication.  He currently cannot have a PCP or therapist or psychiatrist.  He denies SI, HI, or AVH.  Additional history obtained from chart review.  Patient with a history of bipolar, depression, palpitation, anxiety, hypertension, PTSD, alcohol abuse, cocaine use. H/o a fib in 2014, not on blood thinners.     HPI  Past Medical History:  Diagnosis Date  . Anxiety   . Asthma   . Atrial fibrillation (HCC)   . Bipolar 1 disorder (HCC)   . Depression   . Heart palpitations    with severe anxiety  . Hypertension   . PTSD (post-traumatic stress disorder)     Patient Active Problem List   Diagnosis Date Noted  . Alcohol abuse with alcohol-induced mood disorder (HCC) 01/18/2017  . Cocaine dependence with cocaine-induced mood disorder (HCC)   .  Alcohol use disorder, severe, dependence (HCC) 04/12/2015  . Hypertension 09/08/2011  . Post traumatic stress disorder (PTSD) 09/08/2011    Past Surgical History:  Procedure Laterality Date  . NO PAST SURGERIES          Home Medications    Prior to Admission medications   Medication Sig Start Date End Date Taking? Authorizing Provider  busPIRone (BUSPAR) 10 MG tablet Take 1 tablet (10 mg total) by mouth 3 (three) times daily. Patient not taking: Reported on 07/16/2019 03/10/17   Raeford RazorKohut, Stephen, MD  chlordiazePOXIDE (LIBRIUM) 25 MG capsule 50mg  PO TID x 1D, then 25-50mg  PO BID X 1D, then 25-50mg  PO QD X 1D 07/16/19   Camden Knotek, PA-C  gabapentin (NEURONTIN) 300 MG capsule Take 1 capsule (300 mg total) by mouth 3 (three) times daily. Patient not taking: Reported on 07/16/2019 01/18/17   Charm RingsLord, Jamison Y, NP  hydrOXYzine (ATARAX/VISTARIL) 25 MG tablet Take 1 tablet (25 mg total) by mouth every 8 (eight) hours as needed. 07/16/19   Jeremy Mclamb, PA-C  lisinopril (PRINIVIL,ZESTRIL) 10 MG tablet Take 1 tablet (10 mg total) by mouth daily. Patient not taking: Reported on 07/16/2019 03/10/17   Raeford RazorKohut, Stephen, MD  LORazepam (ATIVAN) 1 MG tablet Take 0.5 tablets (0.5 mg total) by mouth 3 (three) times daily as needed for anxiety. Patient not taking: Reported on 07/16/2019 11/01/16   Elpidio AnisUpstill, Shari, PA-C  naproxen (NAPROSYN)  500 MG tablet Take 1 tablet (500 mg total) by mouth 2 (two) times daily. Patient not taking: Reported on 07/16/2019 01/26/17   Horton, Barbette Hair, MD    Family History No family history on file.  Social History Social History   Tobacco Use  . Smoking status: Never Smoker  . Smokeless tobacco: Never Used  Substance Use Topics  . Alcohol use: Yes    Comment: occ  . Drug use: No    Comment: Denies 06/27/15     Allergies   Patient has no known allergies.   Review of Systems Review of Systems  Musculoskeletal: Positive for myalgias.  Neurological:        Tremors  Psychiatric/Behavioral: The patient is nervous/anxious.   All other systems reviewed and are negative.    Physical Exam Updated Vital Signs BP (!) 164/103   Pulse 96   Temp 98.1 F (36.7 C) (Oral)   Resp 19   SpO2 95%   Physical Exam Vitals signs and nursing note reviewed.  Constitutional:      General: He is not in acute distress.    Appearance: He is well-developed.  HENT:     Head: Normocephalic and atraumatic.  Eyes:     Extraocular Movements: Extraocular movements intact.     Conjunctiva/sclera: Conjunctivae normal.     Pupils: Pupils are equal, round, and reactive to light.  Neck:     Musculoskeletal: Normal range of motion and neck supple.  Cardiovascular:     Rate and Rhythm: Regular rhythm. Tachycardia present.     Pulses: Normal pulses.     Comments: Tachycardic around 110. regular rate Pulmonary:     Effort: Pulmonary effort is normal. No respiratory distress.     Breath sounds: Normal breath sounds. No wheezing.     Comments: Clear lung sounds in all fields Abdominal:     General: There is no distension.     Palpations: Abdomen is soft. There is no mass.     Tenderness: There is no abdominal tenderness. There is no guarding or rebound.     Comments: No tenderness palpation of the abdomen.  Musculoskeletal: Normal range of motion.     Comments: Generalized tremors noted.  Skin:    General: Skin is warm and dry.     Capillary Refill: Capillary refill takes less than 2 seconds.  Neurological:     Mental Status: He is alert and oriented to person, place, and time.  Psychiatric:        Attention and Perception: He does not perceive auditory hallucinations.        Mood and Affect: Mood is anxious.        Thought Content: Thought content does not include homicidal or suicidal ideation. Thought content does not include homicidal or suicidal plan.     Comments: Patient appears anxious.      ED Treatments / Results  Labs (all labs ordered are  listed, but only abnormal results are displayed) Labs Reviewed  COMPREHENSIVE METABOLIC PANEL - Abnormal; Notable for the following components:      Result Value   Glucose, Bld 111 (*)    Calcium 8.3 (*)    All other components within normal limits  URINALYSIS, ROUTINE W REFLEX MICROSCOPIC - Abnormal; Notable for the following components:   APPearance HAZY (*)    All other components within normal limits  ETHANOL - Abnormal; Notable for the following components:   Alcohol, Ethyl (B) 56 (*)    All other components  within normal limits  CBC WITH DIFFERENTIAL/PLATELET  RAPID URINE DRUG SCREEN, HOSP PERFORMED    EKG EKG Interpretation  Date/Time:  Saturday July 16 2019 18:22:44 EST Ventricular Rate:  96 PR Interval:    QRS Duration: 94 QT Interval:  341 QTC Calculation: 431 R Axis:   68 Text Interpretation: Sinus rhythm Low voltage, precordial leads Borderline T abnormalities, inferior leads No significant change since 06/29/2017 Confirmed by Kristine Royal [phone]) on 07/16/2019 6:40:43 PM   Radiology No results found.  Procedures Procedures (including critical care time)  Medications Ordered in ED Medications  sodium chloride 0.9 % bolus 1,000 mL (0 mLs Intravenous Stopped 07/16/19 2025)  hydrOXYzine (ATARAX/VISTARIL) tablet 25 mg (25 mg Oral Given 07/16/19 1624)  LORazepam (ATIVAN) injection 1 mg (1 mg Intravenous Given 07/16/19 1800)     Initial Impression / Assessment and Plan / ED Course  I have reviewed the triage vital signs and the nursing notes.  Pertinent labs & imaging results that were available during my care of the patient were reviewed by me and considered in my medical decision making (see chart for details).        Patient presenting for evaluation of alcohol withdrawal and anxiety.  Physical exam shows patient appears nontoxic.  Initially he was tachycardic and tremulous.  CIWA of 13.  Will treat with fluids, paroxetine, and reassess.  Will  obtain labs and EKG due to his medical history and for further evaluation.  Heart rate improved with fluids and hydroxyzine.  Patient remains anxious, is fairly hypertensive.  Will given ativan for further symptom control.   Labs reassuring.  Electrolytes stable.  UDS and UA negative.  Reassessment, patient reports he is still feeling a little bit anxious, however symptoms are improved.  Discussed symptomatic treatment at home, follow-up with outpatient resources, and peer support.  Patient is agreeable to this plan.  Repeat CIWA score 6. At this time, pt appears safe for d/c. Return precautions given. Pt states he understands and agrees to plan.   Final Clinical Impressions(s) / ED Diagnoses   Final diagnoses:  Alcohol withdrawal syndrome without complication [facility])  Dehydration  Anxiety    ED Discharge Orders         Ordered    Consult to Peer Support    Provider:  (Not yet assigned)   07/16/19 1851    chlordiazePOXIDE (LIBRIUM) 25 MG capsule     07/16/19 1852    hydrOXYzine (ATARAX/VISTARIL) 25 MG tablet  Every 8 hours PRN     07/16/19 1854           Alveria Apley, PA-C 07/16/19 2048    Wynetta Fines, MD 07/16/19 2139

## 2019-07-16 NOTE — Discharge Instructions (Signed)
It is very important that you follow-up with behavioral health for your anxiety and alcohol use. Do not drink alcohol. Make sure you are staying well-hydrated water. Take the librium taper as prescribed for tremors and to prevent DTs.  Take hydroxyzine as needed for anxiety.  Return to the emergency room if you have a seizure, you are hallucinating, you are unable to keep fluids down, or with any new, worsening, or concerning symptoms.

## 2019-07-16 NOTE — ED Triage Notes (Signed)
Pt reports he has been in alcohol withdrawal and these symptoms are related to not having a drink.

## 2019-07-16 NOTE — ED Notes (Signed)
My colleague will attempt U/S-guided IV shortly.

## 2019-07-16 NOTE — ED Triage Notes (Signed)
Pt with hx of anxiety and depression x 8 months, has had panic attack today. Has not seen a PMD for over a year and is not taking any medications currently for depression and anxiety.

## 2019-08-02 ENCOUNTER — Other Ambulatory Visit: Payer: Self-pay

## 2019-08-02 ENCOUNTER — Encounter (HOSPITAL_COMMUNITY): Payer: Self-pay | Admitting: Emergency Medicine

## 2019-08-02 ENCOUNTER — Emergency Department (HOSPITAL_COMMUNITY)
Admission: EM | Admit: 2019-08-02 | Discharge: 2019-08-02 | Disposition: A | Discharge status: Home / Self Care | Payer: Medicare Other | Attending: Emergency Medicine | Admitting: Emergency Medicine

## 2019-08-02 DIAGNOSIS — I10 Essential (primary) hypertension: Secondary | ICD-10-CM | POA: Insufficient documentation

## 2019-08-02 DIAGNOSIS — J45909 Unspecified asthma, uncomplicated: Secondary | ICD-10-CM | POA: Insufficient documentation

## 2019-08-02 DIAGNOSIS — Z79899 Other long term (current) drug therapy: Secondary | ICD-10-CM | POA: Insufficient documentation

## 2019-08-02 DIAGNOSIS — F1010 Alcohol abuse, uncomplicated: Secondary | ICD-10-CM

## 2019-08-02 DIAGNOSIS — F10139 Alcohol abuse with withdrawal, unspecified: Secondary | ICD-10-CM | POA: Diagnosis not present

## 2019-08-02 DIAGNOSIS — F419 Anxiety disorder, unspecified: Secondary | ICD-10-CM | POA: Diagnosis present

## 2019-08-02 LAB — RAPID URINE DRUG SCREEN, HOSP PERFORMED
Amphetamines: NOT DETECTED
Barbiturates: NOT DETECTED
Benzodiazepines: POSITIVE — AB
Cocaine: NOT DETECTED
Opiates: NOT DETECTED
Tetrahydrocannabinol: NOT DETECTED

## 2019-08-02 LAB — CBC
HCT: 43 % (ref 39.0–52.0)
Hemoglobin: 14.1 g/dL (ref 13.0–17.0)
MCH: 27.9 pg (ref 26.0–34.0)
MCHC: 32.8 g/dL (ref 30.0–36.0)
MCV: 85.1 fL (ref 80.0–100.0)
Platelets: 289 10*3/uL (ref 150–400)
RBC: 5.05 MIL/uL (ref 4.22–5.81)
RDW: 13.4 % (ref 11.5–15.5)
WBC: 6.8 10*3/uL (ref 4.0–10.5)
nRBC: 0 % (ref 0.0–0.2)

## 2019-08-02 LAB — COMPREHENSIVE METABOLIC PANEL
ALT: 24 U/L (ref 0–44)
AST: 33 U/L (ref 15–41)
Albumin: 4 g/dL (ref 3.5–5.0)
Alkaline Phosphatase: 113 U/L (ref 38–126)
Anion gap: 15 (ref 5–15)
BUN: 5 mg/dL — ABNORMAL LOW (ref 6–20)
CO2: 23 mmol/L (ref 22–32)
Calcium: 8.2 mg/dL — ABNORMAL LOW (ref 8.9–10.3)
Chloride: 99 mmol/L (ref 98–111)
Creatinine, Ser: 0.73 mg/dL (ref 0.61–1.24)
GFR calc Af Amer: >60 mL/min (ref >60)
GFR calc non Af Amer: >60 mL/min (ref >60)
Glucose, Bld: 96 mg/dL (ref 70–99)
Potassium: 3.4 mmol/L — ABNORMAL LOW (ref 3.5–5.1)
Sodium: 137 mmol/L (ref 135–145)
Total Bilirubin: 0.6 mg/dL (ref 0.3–1.2)
Total Protein: 7.5 g/dL (ref 6.5–8.1)

## 2019-08-02 LAB — ETHANOL: Alcohol, Ethyl (B): 95 mg/dL — ABNORMAL HIGH (ref <10)

## 2019-08-02 MED ORDER — CHLORDIAZEPOXIDE HCL 25 MG PO CAPS: ORAL_CAPSULE | ORAL | 0 refills | Status: DC

## 2019-08-02 MED ORDER — LORAZEPAM 2 MG/ML IJ SOLN
2.0000 mg | Freq: Once | INTRAMUSCULAR | Status: AC
  Administered 2019-08-02: 2 mg via INTRAMUSCULAR
  Filled 2019-08-02: qty 1

## 2019-08-02 MED ORDER — LORAZEPAM 2 MG/ML IJ SOLN
1.0000 mg | Freq: Once | INTRAMUSCULAR | Status: AC
  Administered 2019-08-02: 1 mg via INTRAMUSCULAR
  Filled 2019-08-02: qty 1

## 2019-08-02 NOTE — ED Triage Notes (Signed)
Per [facility] pt c/o anxiety after running out of medication 4 days ago. States he was given meds and told to follow up with a PCP but has had trouble finding one. Patient reports binge drinking for 4 days but stopped yesterday. Patient concerned for going into withdrawal.

## 2019-08-02 NOTE — Discharge Instructions (Signed)
Follow up with [facility]

## 2019-08-02 NOTE — ED Provider Notes (Signed)
[facility] EMERGENCY DEPARTMENT Provider Note   CSN: [id number] Arrival date & time: 08/02/19  1521     History   Chief Complaint Chief Complaint  Patient presents with  . ETOH Withdraw    HPI [name] is a 48 y.o. male.     Patient complains of withdrawal symptoms from alcohol.  The history is provided by the patient. No language interpreter was used.  Anxiety This is a recurrent problem. The current episode started 12 to 24 hours ago. The problem occurs constantly. The problem has not changed since onset.Pertinent negatives include no chest pain, no abdominal pain and no headaches. Nothing aggravates the symptoms. Nothing relieves the symptoms. The treatment provided no relief.    Past Medical History:  Diagnosis Date  . Anxiety   . Asthma   . Atrial fibrillation (HCC)   . Bipolar 1 disorder (HCC)   . Depression   . Heart palpitations    with severe anxiety  . Hypertension   . PTSD (post-traumatic stress disorder)     Patient Active Problem List   Diagnosis Date Noted  . Alcohol abuse with alcohol-induced mood disorder (HCC) 01/18/2017  . Cocaine dependence with cocaine-induced mood disorder (HCC)   . Alcohol use disorder, severe, dependence (HCC) 04/12/2015  . Hypertension 09/08/2011  . Post traumatic stress disorder (PTSD) 09/08/2011    Past Surgical History:  Procedure Laterality Date  . NO PAST SURGERIES          Home Medications    Prior to Admission medications   Medication Sig Start Date End Date Taking? Authorizing Provider  hydrOXYzine (ATARAX/VISTARIL) 25 MG tablet Take 1 tablet (25 mg total) by mouth every 8 (eight) hours as needed. Patient taking differently: Take 25 mg by mouth every 8 (eight) hours as needed for anxiety.  07/16/19  Yes Caccavale, Sophia, PA-C  busPIRone (BUSPAR) 10 MG tablet Take 1 tablet (10 mg total) by mouth 3 (three) times daily. Patient not taking: Reported on 07/16/2019 03/10/17   Raeford Razor, MD  chlordiazePOXIDE (LIBRIUM) 25 MG capsule 50mg  PO TID x 1D, then 25-50mg  PO BID X 1D, then 25-50mg  PO QD X 1D 08/02/19   14/1/20, MD  gabapentin (NEURONTIN) 300 MG capsule Take 1 capsule (300 mg total) by mouth 3 (three) times daily. Patient not taking: Reported on 07/16/2019 01/18/17   01/20/17, NP  lisinopril (PRINIVIL,ZESTRIL) 10 MG tablet Take 1 tablet (10 mg total) by mouth daily. Patient not taking: Reported on 07/16/2019 03/10/17   05/11/17, MD  naproxen (NAPROSYN) 500 MG tablet Take 1 tablet (500 mg total) by mouth 2 (two) times daily. Patient not taking: Reported on 07/16/2019 01/26/17   Horton, 01/28/17, MD    Family History History reviewed. No pertinent family history.  Social History Social History   Tobacco Use  . Smoking status: Never Smoker  . Smokeless tobacco: Never Used  Substance Use Topics  . Alcohol use: Yes    Comment: occ  . Drug use: No    Comment: Denies 06/27/15     Allergies   Patient has no known allergies.   Review of Systems Review of Systems  Constitutional: Negative for appetite change and fatigue.  HENT: Negative for congestion, ear discharge and sinus pressure.   Eyes: Negative for discharge.  Respiratory: Negative for cough.   Cardiovascular: Negative for chest pain.  Gastrointestinal: Negative for abdominal pain and diarrhea.  Genitourinary: Negative for frequency and hematuria.  Musculoskeletal: Negative  for back pain.  Skin: Negative for rash.  Neurological: Negative for seizures and headaches.       Anxious  Psychiatric/Behavioral: Negative for hallucinations.     Physical Exam Updated Vital Signs BP (!) 164/103   Pulse 70   Temp 98.2 F (36.8 C) (Oral)   Resp 16   SpO2 98%   Physical Exam Vitals signs and nursing note reviewed.  Constitutional:      Appearance: He is well-developed.  HENT:     Head: Normocephalic.     Nose: Nose normal.  Eyes:     General: No scleral icterus.     Conjunctiva/sclera: Conjunctivae normal.  Neck:     Musculoskeletal: Neck supple.     Thyroid: No thyromegaly.  Cardiovascular:     Rate and Rhythm: Normal rate and regular rhythm.     Heart sounds: No murmur. No friction rub. No gallop.   Pulmonary:     Breath sounds: No stridor. No wheezing or rales.  Chest:     Chest wall: No tenderness.  Abdominal:     General: There is no distension.     Tenderness: There is no abdominal tenderness. There is no rebound.  Musculoskeletal: Normal range of motion.  Lymphadenopathy:     Cervical: No cervical adenopathy.  Skin:    Findings: No erythema or rash.  Neurological:     Mental Status: He is oriented to person, place, and time.     Motor: No abnormal muscle tone.     Coordination: Coordination normal.  Psychiatric:     Comments: anxiety      ED Treatments / Results  Labs (all labs ordered are listed, but only abnormal results are displayed) Labs Reviewed  COMPREHENSIVE METABOLIC PANEL - Abnormal; Notable for the following components:      Result Value   Potassium 3.4 (*)    BUN 5 (*)    Calcium 8.2 (*)    All other components within normal limits  ETHANOL - Abnormal; Notable for the following components:   Alcohol, Ethyl (B) 95 (*)    All other components within normal limits  RAPID URINE DRUG SCREEN, HOSP PERFORMED - Abnormal; Notable for the following components:   Benzodiazepines POSITIVE (*)    All other components within normal limits  CBC    EKG None  Radiology No results found.  Procedures Procedures (including critical care time)  Medications Ordered in ED Medications  LORazepam (ATIVAN) injection 1 mg (1 mg Intramuscular Given 08/02/19 1836)  LORazepam (ATIVAN) injection 2 mg (2 mg Intramuscular Given 08/02/19 1942)     Initial Impression / Assessment and Plan / ED Course  I have reviewed the triage vital signs and the nursing notes.  Pertinent labs & imaging results that were available during my care  of the patient were reviewed by me and considered in my medical decision making (see chart for details).        Patient with alcohol withdrawal symptoms.  Patient given Librium and will follow-up as outpatient Final Clinical Impressions(s) / ED Diagnoses   Final diagnoses:  ETOH abuse    ED Discharge Orders         Ordered    chlordiazePOXIDE (LIBRIUM) 25 MG capsule     08/02/19 2025           Milton Ferguson, MD 08/02/19 2028

## 2019-08-02 NOTE — ED Notes (Signed)
Discharge instructions and prescription discussed with Pt. Pt verbalized understanding. Pt stable and ambulatory.    

## 2019-08-08 ENCOUNTER — Other Ambulatory Visit: Payer: Self-pay

## 2019-08-08 ENCOUNTER — Ambulatory Visit (HOSPITAL_COMMUNITY)
Admission: EM | Admit: 2019-08-08 | Discharge: 2019-08-08 | Disposition: A | Discharge status: Other Acute Inpatient Hospital | Payer: Medicare Other | Source: Home / Self Care

## 2019-08-08 ENCOUNTER — Emergency Department (HOSPITAL_COMMUNITY)
Admission: EM | Admit: 2019-08-08 | Discharge: 2019-08-08 | Disposition: A | Discharge status: Home / Self Care | Payer: Medicare Other | Attending: Emergency Medicine | Admitting: Emergency Medicine

## 2019-08-08 ENCOUNTER — Encounter (HOSPITAL_COMMUNITY): Payer: Self-pay | Admitting: Emergency Medicine

## 2019-08-08 DIAGNOSIS — Z5321 Procedure and treatment not carried out due to patient leaving prior to being seen by health care provider: Secondary | ICD-10-CM | POA: Insufficient documentation

## 2019-08-08 DIAGNOSIS — F10129 Alcohol abuse with intoxication, unspecified: Secondary | ICD-10-CM | POA: Insufficient documentation

## 2019-08-08 LAB — COMPREHENSIVE METABOLIC PANEL
ALT: 19 U/L (ref 0–44)
AST: 22 U/L (ref 15–41)
Albumin: 3.5 g/dL (ref 3.5–5.0)
Alkaline Phosphatase: 110 U/L (ref 38–126)
Anion gap: 10 (ref 5–15)
BUN: 10 mg/dL (ref 6–20)
CO2: 26 mmol/L (ref 22–32)
Calcium: 8.7 mg/dL — ABNORMAL LOW (ref 8.9–10.3)
Chloride: 104 mmol/L (ref 98–111)
Creatinine, Ser: 0.83 mg/dL (ref 0.61–1.24)
GFR calc Af Amer: >60 mL/min (ref >60)
GFR calc non Af Amer: >60 mL/min (ref >60)
Glucose, Bld: 91 mg/dL (ref 70–99)
Potassium: 3.3 mmol/L — ABNORMAL LOW (ref 3.5–5.1)
Sodium: 140 mmol/L (ref 135–145)
Total Bilirubin: 0.2 mg/dL — ABNORMAL LOW (ref 0.3–1.2)
Total Protein: 6.8 g/dL (ref 6.5–8.1)

## 2019-08-08 LAB — CBC
HCT: 42.3 % (ref 39.0–52.0)
Hemoglobin: 13.9 g/dL (ref 13.0–17.0)
MCH: 28.3 pg (ref 26.0–34.0)
MCHC: 32.9 g/dL (ref 30.0–36.0)
MCV: 86.2 fL (ref 80.0–100.0)
Platelets: 278 10*3/uL (ref 150–400)
RBC: 4.91 MIL/uL (ref 4.22–5.81)
RDW: 13.5 % (ref 11.5–15.5)
WBC: 8.3 10*3/uL (ref 4.0–10.5)
nRBC: 0 % (ref 0.0–0.2)

## 2019-08-08 LAB — ETHANOL: Alcohol, Ethyl (B): 14 mg/dL — ABNORMAL HIGH (ref <10)

## 2019-08-08 NOTE — ED Notes (Signed)
Patient is being discharged from the [facility] and sent to the Emergency Department via personal vehicle by self. Per Dr Lanny Cramp, patient is stable but in need of higher level of care due to going through alcohol & drug withdrawals. Patient is aware and verbalizes understanding of plan of care. There were no vitals filed for this visit.

## 2019-08-08 NOTE — ED Triage Notes (Signed)
Pt reports he drinks 1 24 oz beer/day normally, states that last week he drank 3 or 4 24 oz beers over a 3 days span, last drink was Saturday. Feels like he is going through with drawals now. Having shakes, sweating, anxiety. Seen here on 12/1 for the same and was given prescriptions for librium which he states he took but then started drinking again after. Pt a/o4.

## 2019-08-08 NOTE — ED Notes (Signed)
Pt called x3 in the waiting room. No response. 

## 2019-09-07 ENCOUNTER — Other Ambulatory Visit: Payer: Self-pay

## 2019-09-07 ENCOUNTER — Emergency Department (HOSPITAL_COMMUNITY)
Admission: EM | Admit: 2019-09-07 | Discharge: 2019-09-07 | Disposition: A | Discharge status: Home / Self Care | Payer: Medicare Other | Attending: Emergency Medicine | Admitting: Emergency Medicine

## 2019-09-07 DIAGNOSIS — F1010 Alcohol abuse, uncomplicated: Secondary | ICD-10-CM | POA: Diagnosis present

## 2019-09-07 DIAGNOSIS — Z79899 Other long term (current) drug therapy: Secondary | ICD-10-CM | POA: Diagnosis not present

## 2019-09-07 DIAGNOSIS — F419 Anxiety disorder, unspecified: Secondary | ICD-10-CM | POA: Insufficient documentation

## 2019-09-07 DIAGNOSIS — Y906 Blood alcohol level of 120-199 mg/100 ml: Secondary | ICD-10-CM | POA: Diagnosis not present

## 2019-09-07 DIAGNOSIS — I10 Essential (primary) hypertension: Secondary | ICD-10-CM | POA: Insufficient documentation

## 2019-09-07 DIAGNOSIS — J45909 Unspecified asthma, uncomplicated: Secondary | ICD-10-CM | POA: Insufficient documentation

## 2019-09-07 LAB — COMPREHENSIVE METABOLIC PANEL
ALT: 25 U/L (ref 0–44)
AST: 37 U/L (ref 15–41)
Albumin: 4.2 g/dL (ref 3.5–5.0)
Alkaline Phosphatase: 114 U/L (ref 38–126)
Anion gap: 13 (ref 5–15)
BUN: 8 mg/dL (ref 6–20)
CO2: 22 mmol/L (ref 22–32)
Calcium: 8.2 mg/dL — ABNORMAL LOW (ref 8.9–10.3)
Chloride: 103 mmol/L (ref 98–111)
Creatinine, Ser: 0.74 mg/dL (ref 0.61–1.24)
GFR calc Af Amer: >60 mL/min (ref >60)
GFR calc non Af Amer: >60 mL/min (ref >60)
Glucose, Bld: 110 mg/dL — ABNORMAL HIGH (ref 70–99)
Potassium: 3.8 mmol/L (ref 3.5–5.1)
Sodium: 138 mmol/L (ref 135–145)
Total Bilirubin: 0.7 mg/dL (ref 0.3–1.2)
Total Protein: 7.6 g/dL (ref 6.5–8.1)

## 2019-09-07 LAB — CBC
HCT: 45.2 % (ref 39.0–52.0)
Hemoglobin: 15.1 g/dL (ref 13.0–17.0)
MCH: 28.4 pg (ref 26.0–34.0)
MCHC: 33.4 g/dL (ref 30.0–36.0)
MCV: 85.1 fL (ref 80.0–100.0)
Platelets: 311 10*3/uL (ref 150–400)
RBC: 5.31 MIL/uL (ref 4.22–5.81)
RDW: 13.5 % (ref 11.5–15.5)
WBC: 6.2 10*3/uL (ref 4.0–10.5)
nRBC: 0.3 % — ABNORMAL HIGH (ref 0.0–0.2)

## 2019-09-07 LAB — ETHANOL: Alcohol, Ethyl (B): 186 mg/dL — ABNORMAL HIGH (ref <10)

## 2019-09-07 LAB — RAPID URINE DRUG SCREEN, HOSP PERFORMED
Amphetamines: NOT DETECTED
Barbiturates: NOT DETECTED
Benzodiazepines: POSITIVE — AB
Cocaine: NOT DETECTED
Opiates: NOT DETECTED
Tetrahydrocannabinol: NOT DETECTED

## 2019-09-07 MED ORDER — ESCITALOPRAM OXALATE 10 MG PO TABS
5.0000 mg | ORAL_TABLET | Freq: Once | ORAL | Status: AC
  Administered 2019-09-07: 5 mg via ORAL
  Filled 2019-09-07: qty 1

## 2019-09-07 MED ORDER — ESCITALOPRAM OXALATE 5 MG PO TABS: 5.0000 mg | ORAL_TABLET | Freq: Every day | ORAL | 2 refills | Status: DC

## 2019-09-07 MED ORDER — CHLORDIAZEPOXIDE HCL 25 MG PO CAPS
25.0000 mg | ORAL_CAPSULE | Freq: Once | ORAL | Status: AC
  Administered 2019-09-07: 25 mg via ORAL
  Filled 2019-09-07: qty 1

## 2019-09-07 MED ORDER — CHLORDIAZEPOXIDE HCL 25 MG PO CAPS: ORAL_CAPSULE | ORAL | 0 refills | Status: DC

## 2019-09-07 MED ORDER — LORAZEPAM 2 MG/ML IJ SOLN
1.0000 mg | Freq: Once | INTRAMUSCULAR | Status: AC
  Administered 2019-09-07: 1 mg via INTRAVENOUS
  Filled 2019-09-07: qty 1

## 2019-09-07 MED ORDER — SODIUM CHLORIDE 0.9 % IV BOLUS
1000.0000 mL | Freq: Once | INTRAVENOUS | Status: AC
  Administered 2019-09-07: 1000 mL via INTRAVENOUS

## 2019-09-07 NOTE — Discharge Instructions (Signed)
As discussed, with your history of anxiety, and recent binge drinking you are receiving therapy both for anxiety and to facilitate your cessation of alcohol abuse.  Please be sure to follow-up with your physician or return here for concerning changes.

## 2019-09-07 NOTE — ED Triage Notes (Signed)
Pt here for withdrawal symptoms.  States usually drinks 3-4, 24 oz beers daily, past 5 days has drank more.  Last drink 7 hours ago.   Pt having shakes and anxiety.  Keeps apologizing for coming to ED, keeps saying "I hope I don't die".  Pt teaful states "new years was difficult for me".

## 2019-09-07 NOTE — ED Notes (Signed)
Pt has been sleeping, states that he feels much better and that meds have helped.  Discussed importance of taking prescribed meds and to come back for concerns

## 2019-09-07 NOTE — ED Provider Notes (Signed)
[facility] EMERGENCY DEPARTMENT Provider Note   CSN: [id number] Arrival date & time: 09/07/19  1129     History Chief Complaint  Patient presents with  . Withdrawal    [name] is a 49 y.o. male.  HPI    Patient presents with concern of possible alcohol withdrawal. He notes a history of PTSD and anxiety.  He denies other medical problems, denies taking any medication regularly. He has previously been on anxiolytics, but is unclear when this stopped. He has also had prior hospitalizations for alcohol withdrawal, though this was sometime ago. About 1 month ago though the patient had a similar episode to that which brings him here today. He now presents with concern of copious amounts of alcohol intake over the past 4 days due to worsening anxiety, increased life stress. He denies focal pain anywhere complains of shakiness, nausea, unsteadiness, inability to focus. He denies depression.   Past Medical History:  Diagnosis Date  . Anxiety   . Asthma   . Atrial fibrillation (HCC)   . Bipolar 1 disorder (HCC)   . Depression   . Heart palpitations    with severe anxiety  . Hypertension   . PTSD (post-traumatic stress disorder)     Patient Active Problem List   Diagnosis Date Noted  . Alcohol abuse with alcohol-induced mood disorder (HCC) 01/18/2017  . Cocaine dependence with cocaine-induced mood disorder (HCC)   . Alcohol use disorder, severe, dependence (HCC) 04/12/2015  . Hypertension 09/08/2011  . Post traumatic stress disorder (PTSD) 09/08/2011    Past Surgical History:  Procedure Laterality Date  . NO PAST SURGERIES         No family history on file.  Social History   Tobacco Use  . Smoking status: Never Smoker  . Smokeless tobacco: Never Used  Substance Use Topics  . Alcohol use: Yes    Comment: occ  . Drug use: No    Comment: Denies 06/27/15    Home Medications Prior to Admission medications   Medication Sig Start Date  End Date Taking? Authorizing Provider  busPIRone (BUSPAR) 10 MG tablet Take 1 tablet (10 mg total) by mouth 3 (three) times daily. Patient not taking: Reported on 07/16/2019 03/10/17   Raeford Razor, MD  chlordiazePOXIDE (LIBRIUM) 25 MG capsule 50mg  PO TID x 1D, then 25-50mg  PO BID X 1D, then 25-50mg  PO QD X 1D 08/02/19   14/1/20, MD  gabapentin (NEURONTIN) 300 MG capsule Take 1 capsule (300 mg total) by mouth 3 (three) times daily. Patient not taking: Reported on 07/16/2019 01/18/17   01/20/17, NP  hydrOXYzine (ATARAX/VISTARIL) 25 MG tablet Take 1 tablet (25 mg total) by mouth every 8 (eight) hours as needed. Patient taking differently: Take 25 mg by mouth every 8 (eight) hours as needed for anxiety.  07/16/19   Caccavale, Sophia, PA-C  lisinopril (PRINIVIL,ZESTRIL) 10 MG tablet Take 1 tablet (10 mg total) by mouth daily. Patient not taking: Reported on 07/16/2019 03/10/17   05/11/17, MD  naproxen (NAPROSYN) 500 MG tablet Take 1 tablet (500 mg total) by mouth 2 (two) times daily. Patient not taking: Reported on 07/16/2019 01/26/17   Horton, 01/28/17, MD    Allergies    Patient has no known allergies.  Review of Systems   Review of Systems  Constitutional:       Per HPI, otherwise negative  HENT:       Per HPI, otherwise negative  Respiratory:  Per HPI, otherwise negative  Cardiovascular:       Per HPI, otherwise negative  Gastrointestinal: Positive for nausea. Negative for vomiting.  Endocrine:       Negative aside from HPI  Genitourinary:       Neg aside from HPI   Musculoskeletal:       Per HPI, otherwise negative  Skin: Negative.   Neurological: Negative for syncope.  Psychiatric/Behavioral: Positive for sleep disturbance. The patient is nervous/anxious.     Physical Exam Updated Vital Signs BP (!) 163/94   Pulse (!) 106   Temp 99.2 F (37.3 C) (Oral)   Resp (!) 24   SpO2 96%   Physical Exam Vitals and nursing note reviewed.   Constitutional:      Appearance: He is well-developed.     Comments: Anxious, shaking adult male awake and alert  HENT:     Head: Normocephalic and atraumatic.  Eyes:     Conjunctiva/sclera: Conjunctivae normal.  Cardiovascular:     Rate and Rhythm: Regular rhythm. Tachycardia present.  Pulmonary:     Effort: Pulmonary effort is normal. No respiratory distress.     Breath sounds: No stridor.  Abdominal:     General: There is no distension.     Tenderness: There is no abdominal tenderness.  Skin:    General: Skin is warm and dry.  Neurological:     Mental Status: He is alert and oriented to person, place, and time.  Psychiatric:        Mood and Affect: Mood is anxious.        Cognition and Memory: Cognition is not impaired.     ED Results / Procedures / Treatments   Labs (all labs ordered are listed, but only abnormal results are displayed) Labs Reviewed  COMPREHENSIVE METABOLIC PANEL - Abnormal; Notable for the following components:      Result Value   Glucose, Bld 110 (*)    Calcium 8.2 (*)    All other components within normal limits  ETHANOL - Abnormal; Notable for the following components:   Alcohol, Ethyl (B) 186 (*)    All other components within normal limits  CBC - Abnormal; Notable for the following components:   nRBC 0.3 (*)    All other components within normal limits  RAPID URINE DRUG SCREEN, HOSP PERFORMED - Abnormal; Notable for the following components:   Benzodiazepines POSITIVE (*)    All other components within normal limits    Procedures Procedures (including critical care time)  Medications Ordered in ED Medications  sodium chloride 0.9 % bolus 1,000 mL (1,000 mLs Intravenous New Bag/Given 09/07/19 1413)  LORazepam (ATIVAN) injection 1 mg (1 mg Intravenous Given 09/07/19 1413)  escitalopram (LEXAPRO) tablet 5 mg (5 mg Oral Given 09/07/19 1422)  LORazepam (ATIVAN) injection 1 mg (1 mg Intravenous Given 09/07/19 1523)  chlordiazePOXIDE (LIBRIUM)  capsule 25 mg (25 mg Oral Given 09/07/19 1523)    ED Course  I have reviewed the triage vital signs and the nursing notes.  Pertinent labs & imaging results that were available during my care of the patient were reviewed by me and considered in my medical decision making (see chart for details).    MDM Rules/Calculators/A&P                      3:37 PM Patient awake, alert, now much more calm. He has received Ativan x2, Librium, Lexapro.  We had lengthy conversation about today's evaluation, concern for his  recent binge. There is no evidence for complicated withdrawal, no seizures, no neuro complications. Patient is amenable to outpatient follow-up, ongoing Librium therapy, and initiation of Lexapro for his anxiety and PTSD.    Final Clinical Impression(s) / ED Diagnoses Final diagnoses:  Alcohol abuse  Anxiousness    Rx / DC Orders ED Discharge Orders         Ordered    chlordiazePOXIDE (LIBRIUM) 25 MG capsule     09/07/19 1540    escitalopram (LEXAPRO) 5 MG tablet  Daily     09/07/19 1540           Gerhard Munch, MD 09/07/19 1542

## 2019-09-07 NOTE — ED Notes (Signed)
Pt states that he can feel that the ativan "took the edge off" but pt is still anxious and asked about additional medication.

## 2019-10-10 ENCOUNTER — Other Ambulatory Visit: Payer: Self-pay

## 2019-10-10 ENCOUNTER — Encounter (HOSPITAL_COMMUNITY): Payer: Self-pay | Admitting: Emergency Medicine

## 2019-10-10 ENCOUNTER — Emergency Department (HOSPITAL_COMMUNITY)
Admission: EM | Admit: 2019-10-10 | Discharge: 2019-10-10 | Disposition: A | Discharge status: Home / Self Care | Payer: Medicare Other | Attending: Emergency Medicine | Admitting: Emergency Medicine

## 2019-10-10 DIAGNOSIS — F10930 Alcohol use, unspecified with withdrawal, uncomplicated: Secondary | ICD-10-CM

## 2019-10-10 DIAGNOSIS — F10139 Alcohol abuse with withdrawal, unspecified: Secondary | ICD-10-CM | POA: Diagnosis not present

## 2019-10-10 DIAGNOSIS — F319 Bipolar disorder, unspecified: Secondary | ICD-10-CM | POA: Insufficient documentation

## 2019-10-10 DIAGNOSIS — Z79899 Other long term (current) drug therapy: Secondary | ICD-10-CM | POA: Insufficient documentation

## 2019-10-10 DIAGNOSIS — I10 Essential (primary) hypertension: Secondary | ICD-10-CM | POA: Diagnosis not present

## 2019-10-10 DIAGNOSIS — F10230 Alcohol dependence with withdrawal, uncomplicated: Secondary | ICD-10-CM

## 2019-10-10 DIAGNOSIS — J45909 Unspecified asthma, uncomplicated: Secondary | ICD-10-CM | POA: Insufficient documentation

## 2019-10-10 DIAGNOSIS — F419 Anxiety disorder, unspecified: Secondary | ICD-10-CM | POA: Diagnosis present

## 2019-10-10 MED ORDER — CHLORDIAZEPOXIDE HCL 25 MG PO CAPS
25.0000 mg | ORAL_CAPSULE | Freq: Once | ORAL | Status: AC
  Administered 2019-10-10: 25 mg via ORAL
  Filled 2019-10-10: qty 1

## 2019-10-10 MED ORDER — CHLORDIAZEPOXIDE HCL 25 MG PO CAPS: ORAL_CAPSULE | ORAL | 0 refills | Status: DC

## 2019-10-10 NOTE — ED Triage Notes (Signed)
Pt presents by [facility] for increased anxiety and possible alcohol withdrawal. EMS reports that pt binges for 5 days on beer and last drink was 1130 on 10/09/19. Pt denies any SI/HI.

## 2019-10-10 NOTE — ED Provider Notes (Signed)
[facility]-EMERGENCY DEPT Provider Note  CSN: [id number] Arrival date & time: 10/10/19 0057  Chief Complaint(s) Anxiety and Alcohol Intoxication  HPI [name] is a 49 y.o. male with a past medical history listed below including anxiety, bipolar and alcohol use disorder who presents to the emergency department with reported withdrawal symptoms.  He states that he went on a 5-day beer binge and stopped drinking yesterday.  States that he began to feel uneasy and tremulous several hours prior to arrival.  He denies any suicidal ideation, homicidal ideation, AVH.  No recent fevers or infections.  No chest pain or shortness of breath.  No abdominal pain.  No other physical complaints.  HPI  Past Medical History Past Medical History:  Diagnosis Date  . Anxiety   . Asthma   . Atrial fibrillation (HCC)   . Bipolar 1 disorder (HCC)   . Depression   . Heart palpitations    with severe anxiety  . Hypertension   . PTSD (post-traumatic stress disorder)    Patient Active Problem List   Diagnosis Date Noted  . Alcohol abuse with alcohol-induced mood disorder (HCC) 01/18/2017  . Cocaine dependence with cocaine-induced mood disorder (HCC)   . Alcohol use disorder, severe, dependence (HCC) 04/12/2015  . Hypertension 09/08/2011  . Post traumatic stress disorder (PTSD) 09/08/2011   Home Medication(s) Prior to Admission medications   Medication Sig Start Date End Date Taking? Authorizing Provider  B Complex-C (B-COMPLEX WITH VITAMIN C) tablet Take 1 tablet by mouth daily.   Yes [provider]  diphenhydrAMINE (BENADRYL) 25 MG tablet Take 25 mg by mouth every 6 (six) hours as needed for itching or allergies.   Yes [provider]  escitalopram (LEXAPRO) 5 MG tablet Take 1 tablet (5 mg total) by mouth daily. 09/07/19  Yes Gerhard Munch, MD  hydrOXYzine (ATARAX/VISTARIL) 25 MG tablet Take 1 tablet (25 mg total) by mouth every 8 (eight) hours as  needed. Patient taking differently: Take 25 mg by mouth every 8 (eight) hours as needed for anxiety.  07/16/19  Yes Caccavale, Sophia, PA-C  polycarbophil (FIBERCON) 625 MG tablet Take 1,250 mg by mouth daily.   Yes [provider]  busPIRone (BUSPAR) 10 MG tablet Take 1 tablet (10 mg total) by mouth 3 (three) times daily. Patient not taking: Reported on 07/16/2019 03/10/17   Raeford Razor, MD  chlordiazePOXIDE (LIBRIUM) 25 MG capsule 50mg  PO TID x 1D, then 25-50mg  PO BID X 1D, then 25-50mg  PO QD X 1D 10/10/19   Roneshia Drew, 12/08/19, MD  gabapentin (NEURONTIN) 300 MG capsule Take 1 capsule (300 mg total) by mouth 3 (three) times daily. Patient not taking: Reported on 07/16/2019 01/18/17   01/20/17, NP  lisinopril (PRINIVIL,ZESTRIL) 10 MG tablet Take 1 tablet (10 mg total) by mouth daily. Patient not taking: Reported on 07/16/2019 03/10/17   05/11/17, MD  naproxen (NAPROSYN) 500 MG tablet Take 1 tablet (500 mg total) by mouth 2 (two) times daily. Patient not taking: Reported on 07/16/2019 01/26/17   Horton, 01/28/17, MD  Past Surgical History Past Surgical History:  Procedure Laterality Date  . NO PAST SURGERIES     Family History History reviewed. No pertinent family history.  Social History Social History   Tobacco Use  . Smoking status: Never Smoker  . Smokeless tobacco: Never Used  Substance Use Topics  . Alcohol use: Yes    Comment: 4- 24oz beer daily  . Drug use: No    Comment: Denies 06/27/15   Allergies Patient has no known allergies.  Review of Systems Review of Systems All other systems are reviewed and are negative for acute change except as noted in the HPI  Physical Exam Vital Signs  I have reviewed the triage vital signs BP (!) 187/127   Pulse 98   Temp 99.7 F (37.6 C) (Oral)   Resp (!) 25   Ht 5\' 7"  (1.702  m)   Wt 104.3 kg   SpO2 96%   BMI 36.02 kg/m   Physical Exam Vitals reviewed.  Constitutional:      General: He is not in acute distress.    Appearance: He is well-developed. He is not diaphoretic.  HENT:     Head: Normocephalic and atraumatic.     Nose: Nose normal.  Eyes:     General: No scleral icterus.       Right eye: No discharge.        Left eye: No discharge.     Conjunctiva/sclera: Conjunctivae normal.     Pupils: Pupils are equal, round, and reactive to light.  Cardiovascular:     Rate and Rhythm: Normal rate and regular rhythm.     Heart sounds: No murmur. No friction rub. No gallop.   Pulmonary:     Effort: Pulmonary effort is normal. No respiratory distress.     Breath sounds: Normal breath sounds. No stridor. No rales.  Abdominal:     General: There is no distension.     Palpations: Abdomen is soft.     Tenderness: There is no abdominal tenderness.  Musculoskeletal:        General: No tenderness.     Cervical back: Normal range of motion and neck supple.  Skin:    General: Skin is warm and dry.     Findings: No erythema or rash.     Comments: Numerous remote laceration scars to extremities and torso  Neurological:     Mental Status: He is alert and oriented to person, place, and time.     ED Results and Treatments Labs (all labs ordered are listed, but only abnormal results are displayed) Labs Reviewed - No data to display                                                                                                                       EKG  EKG Interpretation  Date/Time:    Ventricular Rate:    PR Interval:    QRS Duration:   QT Interval:    QTC Calculation:   R  Axis:     Text Interpretation:        Radiology No results found.  Pertinent labs & imaging results that were available during my care of the patient were reviewed by me and considered in my medical decision making (see chart for details).  Medications Ordered in  ED Medications  chlordiazePOXIDE (LIBRIUM) capsule 25 mg (25 mg Oral Given 10/10/19 0153)  chlordiazePOXIDE (LIBRIUM) capsule 25 mg (25 mg Oral Given 10/10/19 0342)                                                                                                                                    Procedures Procedures  (including critical care time)  Medical Decision Making / ED Course I have reviewed the nursing notes for this encounter and the patient's prior records (if available in EHR or on provided paperwork).   MAX NUNO was evaluated in Emergency Department on 10/10/2019 for the symptoms described in the history of present illness. He was evaluated in the context of the global COVID-19 pandemic, which necessitated consideration that the patient might be at risk for infection with the SARS-CoV-2 virus that causes COVID-19. Institutional protocols and algorithms that pertain to the evaluation of patients at risk for COVID-19 are in a state of rapid change based on information released by regulatory bodies including the CDC and federal and state organizations. These policies and algorithms were followed during the patient's care in the ED.  Patient is anxious and mildly tremulous. Does have HTN.  Not tachycardic.  Not diaphoretic.  Not febrile.  Not in severe withdrawals.  Believe he is appropriate for oral management.  Dose of Librium given.  BP and tremors improved.  Resources provided.      Final Clinical Impression(s) / ED Diagnoses Final diagnoses:  Alcohol withdrawal, uncomplicated ([place])   The patient appears reasonably screened and/or stabilized for discharge and I doubt any other medical condition or other [facility] requiring further screening, evaluation, or treatment in the ED at this time prior to discharge. Safe for discharge with strict return precautions.  Disposition: Discharge  Condition: Good  I have discussed the results, Dx and Tx plan with the patient/family who  expressed understanding and agree(s) with the plan. Discharge instructions discussed at length. The patient/family was given strict return precautions who verbalized understanding of the instructions. No further questions at time of discharge.    ED Discharge Orders         Ordered    chlordiazePOXIDE (LIBRIUM) 25 MG capsule     10/10/19 0425          [facility] narcotic database reviewed and no active prescriptions noted.       This chart was dictated using voice recognition software.  Despite best efforts to proofread,  errors can occur which can change the documentation meaning.   Fatima Blank, MD 10/10/19 [phone]

## 2020-04-21 ENCOUNTER — Inpatient Hospital Stay (HOSPITAL_COMMUNITY)
Admission: EM | Admit: 2020-04-21 | Discharge: 2020-04-23 | DRG: 894 | Discharge status: Against Medical Advice | Payer: Medicare Other | Attending: Family Medicine | Admitting: Family Medicine

## 2020-04-21 ENCOUNTER — Other Ambulatory Visit: Payer: Self-pay

## 2020-04-21 ENCOUNTER — Emergency Department (HOSPITAL_COMMUNITY): Payer: Medicare Other

## 2020-04-21 DIAGNOSIS — Z6838 Body mass index (BMI) 38.0-38.9, adult: Secondary | ICD-10-CM

## 2020-04-21 DIAGNOSIS — E669 Obesity, unspecified: Secondary | ICD-10-CM

## 2020-04-21 DIAGNOSIS — I4891 Unspecified atrial fibrillation: Secondary | ICD-10-CM | POA: Diagnosis present

## 2020-04-21 DIAGNOSIS — F10232 Alcohol dependence with withdrawal with perceptual disturbance: Secondary | ICD-10-CM | POA: Diagnosis present

## 2020-04-21 DIAGNOSIS — F329 Major depressive disorder, single episode, unspecified: Secondary | ICD-10-CM | POA: Diagnosis not present

## 2020-04-21 DIAGNOSIS — I493 Ventricular premature depolarization: Secondary | ICD-10-CM | POA: Diagnosis present

## 2020-04-21 DIAGNOSIS — F10239 Alcohol dependence with withdrawal, unspecified: Secondary | ICD-10-CM | POA: Diagnosis present

## 2020-04-21 DIAGNOSIS — F4310 Post-traumatic stress disorder, unspecified: Secondary | ICD-10-CM | POA: Diagnosis present

## 2020-04-21 DIAGNOSIS — J45909 Unspecified asthma, uncomplicated: Secondary | ICD-10-CM | POA: Diagnosis present

## 2020-04-21 DIAGNOSIS — Z9119 Patient's noncompliance with other medical treatment and regimen: Secondary | ICD-10-CM

## 2020-04-21 DIAGNOSIS — I10 Essential (primary) hypertension: Secondary | ICD-10-CM | POA: Diagnosis present

## 2020-04-21 DIAGNOSIS — Z5329 Procedure and treatment not carried out because of patient's decision for other reasons: Secondary | ICD-10-CM | POA: Diagnosis present

## 2020-04-21 DIAGNOSIS — I160 Hypertensive urgency: Secondary | ICD-10-CM | POA: Diagnosis present

## 2020-04-21 DIAGNOSIS — R002 Palpitations: Secondary | ICD-10-CM | POA: Diagnosis present

## 2020-04-21 DIAGNOSIS — Z20822 Contact with and (suspected) exposure to covid-19: Secondary | ICD-10-CM | POA: Diagnosis present

## 2020-04-21 DIAGNOSIS — Y900 Blood alcohol level of less than 20 mg/100 ml: Secondary | ICD-10-CM | POA: Diagnosis present

## 2020-04-21 DIAGNOSIS — R000 Tachycardia, unspecified: Secondary | ICD-10-CM

## 2020-04-21 DIAGNOSIS — F10932 Alcohol use, unspecified with withdrawal with perceptual disturbance: Secondary | ICD-10-CM

## 2020-04-21 DIAGNOSIS — R569 Unspecified convulsions: Secondary | ICD-10-CM | POA: Diagnosis present

## 2020-04-21 DIAGNOSIS — Z79899 Other long term (current) drug therapy: Secondary | ICD-10-CM

## 2020-04-21 DIAGNOSIS — F319 Bipolar disorder, unspecified: Secondary | ICD-10-CM | POA: Diagnosis present

## 2020-04-21 DIAGNOSIS — R441 Visual hallucinations: Secondary | ICD-10-CM | POA: Diagnosis present

## 2020-04-21 DIAGNOSIS — Z9114 Patient's other noncompliance with medication regimen: Secondary | ICD-10-CM

## 2020-04-21 DIAGNOSIS — F10939 Alcohol use, unspecified with withdrawal, unspecified: Secondary | ICD-10-CM | POA: Diagnosis present

## 2020-04-21 DIAGNOSIS — F419 Anxiety disorder, unspecified: Secondary | ICD-10-CM

## 2020-04-21 LAB — COMPREHENSIVE METABOLIC PANEL
ALT: 19 U/L (ref 0–44)
AST: 26 U/L (ref 15–41)
Albumin: 4.3 g/dL (ref 3.5–5.0)
Alkaline Phosphatase: 99 U/L (ref 38–126)
Anion gap: 12 (ref 5–15)
BUN: 6 mg/dL (ref 6–20)
CO2: 23 mmol/L (ref 22–32)
Calcium: 7.9 mg/dL — ABNORMAL LOW (ref 8.9–10.3)
Chloride: 102 mmol/L (ref 98–111)
Creatinine, Ser: 0.65 mg/dL (ref 0.61–1.24)
GFR calc Af Amer: >60 mL/min (ref >60)
GFR calc non Af Amer: >60 mL/min (ref >60)
Glucose, Bld: 97 mg/dL (ref 70–99)
Potassium: 4 mmol/L (ref 3.5–5.1)
Sodium: 137 mmol/L (ref 135–145)
Total Bilirubin: 0.5 mg/dL (ref 0.3–1.2)
Total Protein: 7.7 g/dL (ref 6.5–8.1)

## 2020-04-21 LAB — CBC WITH DIFFERENTIAL/PLATELET
Abs Immature Granulocytes: 0.02 10*3/uL (ref 0.00–0.07)
Basophils Absolute: 0 10*3/uL (ref 0.0–0.1)
Basophils Relative: 0 %
Eosinophils Absolute: 0 10*3/uL (ref 0.0–0.5)
Eosinophils Relative: 0 %
HCT: 43.7 % (ref 39.0–52.0)
Hemoglobin: 14.1 g/dL (ref 13.0–17.0)
Immature Granulocytes: 0 %
Lymphocytes Relative: 17 %
Lymphs Abs: 1.2 10*3/uL (ref 0.7–4.0)
MCH: 27.4 pg (ref 26.0–34.0)
MCHC: 32.3 g/dL (ref 30.0–36.0)
MCV: 84.9 fL (ref 80.0–100.0)
Monocytes Absolute: 0.5 10*3/uL (ref 0.1–1.0)
Monocytes Relative: 7 %
Neutro Abs: 5.5 10*3/uL (ref 1.7–7.7)
Neutrophils Relative %: 76 %
Platelets: 260 10*3/uL (ref 150–400)
RBC: 5.15 MIL/uL (ref 4.22–5.81)
RDW: 13.9 % (ref 11.5–15.5)
WBC: 7.2 10*3/uL (ref 4.0–10.5)
nRBC: 0 % (ref 0.0–0.2)

## 2020-04-21 LAB — RAPID URINE DRUG SCREEN, HOSP PERFORMED
Amphetamines: NOT DETECTED
Barbiturates: NOT DETECTED
Benzodiazepines: NOT DETECTED
Cocaine: NOT DETECTED
Opiates: NOT DETECTED
Tetrahydrocannabinol: NOT DETECTED

## 2020-04-21 LAB — SARS CORONAVIRUS 2 BY RT PCR (HOSPITAL ORDER, PERFORMED IN ~~LOC~~ HOSPITAL LAB): SARS Coronavirus 2: NEGATIVE

## 2020-04-21 LAB — SALICYLATE LEVEL: Salicylate Lvl: <7 mg/dL — ABNORMAL LOW (ref 7.0–30.0)

## 2020-04-21 LAB — HIV ANTIBODY (ROUTINE TESTING W REFLEX): HIV Screen 4th Generation wRfx: NONREACTIVE

## 2020-04-21 LAB — ACETAMINOPHEN LEVEL: Acetaminophen (Tylenol), Serum: <10 ug/mL — ABNORMAL LOW (ref 10–30)

## 2020-04-21 LAB — MAGNESIUM: Magnesium: 2.1 mg/dL (ref 1.7–2.4)

## 2020-04-21 LAB — ETHANOL: Alcohol, Ethyl (B): 15 mg/dL — ABNORMAL HIGH (ref <10)

## 2020-04-21 LAB — TROPONIN I (HIGH SENSITIVITY): Troponin I (High Sensitivity): 4 ng/L (ref <18)

## 2020-04-21 MED ORDER — LORAZEPAM 2 MG/ML IJ SOLN
1.0000 mg | INTRAMUSCULAR | Status: DC | PRN
  Administered 2020-04-23 (×2): 2 mg via INTRAVENOUS
  Filled 2020-04-21 (×2): qty 1

## 2020-04-21 MED ORDER — LISINOPRIL 20 MG PO TABS
20.0000 mg | ORAL_TABLET | Freq: Every day | ORAL | Status: DC
  Administered 2020-04-21 – 2020-04-23 (×3): 20 mg via ORAL
  Filled 2020-04-21 (×3): qty 1

## 2020-04-21 MED ORDER — THIAMINE HCL 100 MG PO TABS
100.0000 mg | ORAL_TABLET | Freq: Every day | ORAL | Status: DC
  Administered 2020-04-22 – 2020-04-23 (×2): 100 mg via ORAL
  Filled 2020-04-21 (×2): qty 1

## 2020-04-21 MED ORDER — SODIUM CHLORIDE 0.9 % IV BOLUS
500.0000 mL | Freq: Once | INTRAVENOUS | Status: AC
  Administered 2020-04-21: 500 mL via INTRAVENOUS

## 2020-04-21 MED ORDER — CALCIUM GLUCONATE-NACL 2-0.675 GM/100ML-% IV SOLN
2.0000 g | Freq: Once | INTRAVENOUS | Status: AC
  Administered 2020-04-21: 2000 mg via INTRAVENOUS
  Filled 2020-04-21: qty 100

## 2020-04-21 MED ORDER — CHLORDIAZEPOXIDE HCL 10 MG PO CAPS
10.0000 mg | ORAL_CAPSULE | Freq: Three times a day (TID) | ORAL | Status: DC
  Administered 2020-04-22 – 2020-04-23 (×2): 10 mg via ORAL
  Filled 2020-04-21 (×2): qty 1

## 2020-04-21 MED ORDER — CHLORDIAZEPOXIDE HCL 25 MG PO CAPS
25.0000 mg | ORAL_CAPSULE | Freq: Once | ORAL | Status: AC
  Administered 2020-04-21: 25 mg via ORAL
  Filled 2020-04-21: qty 1

## 2020-04-21 MED ORDER — LORAZEPAM 1 MG PO TABS: 0.0000 mg | ORAL_TABLET | Freq: Four times a day (QID) | ORAL | Status: DC

## 2020-04-21 MED ORDER — THIAMINE HCL 100 MG PO TABS: 100.0000 mg | ORAL_TABLET | Freq: Every day | ORAL | Status: DC

## 2020-04-21 MED ORDER — LISINOPRIL 10 MG PO TABS: 10.0000 mg | ORAL_TABLET | Freq: Every day | ORAL | Status: DC

## 2020-04-21 MED ORDER — ADULT MULTIVITAMIN W/MINERALS CH
1.0000 | ORAL_TABLET | Freq: Every day | ORAL | Status: DC
  Administered 2020-04-21 – 2020-04-23 (×3): 1 via ORAL
  Filled 2020-04-21 (×3): qty 1

## 2020-04-21 MED ORDER — LORAZEPAM 2 MG/ML IJ SOLN
0.0000 mg | Freq: Four times a day (QID) | INTRAMUSCULAR | Status: DC
  Administered 2020-04-21: 2 mg via INTRAVENOUS
  Filled 2020-04-21: qty 1

## 2020-04-21 MED ORDER — CHLORDIAZEPOXIDE HCL 10 MG PO CAPS: 10.0000 mg | ORAL_CAPSULE | ORAL | Status: DC

## 2020-04-21 MED ORDER — CHLORDIAZEPOXIDE HCL 10 MG PO CAPS
10.0000 mg | ORAL_CAPSULE | Freq: Four times a day (QID) | ORAL | Status: AC
  Administered 2020-04-21 – 2020-04-22 (×4): 10 mg via ORAL
  Filled 2020-04-21 (×4): qty 1

## 2020-04-21 MED ORDER — LORAZEPAM 2 MG/ML IJ SOLN: 0.0000 mg | Freq: Two times a day (BID) | INTRAMUSCULAR | Status: DC

## 2020-04-21 MED ORDER — CHLORDIAZEPOXIDE HCL 10 MG PO CAPS: 10.0000 mg | ORAL_CAPSULE | Freq: Every day | ORAL | Status: DC

## 2020-04-21 MED ORDER — THIAMINE HCL 100 MG/ML IJ SOLN
100.0000 mg | Freq: Every day | INTRAMUSCULAR | Status: DC
  Administered 2020-04-21: 100 mg via INTRAVENOUS

## 2020-04-21 MED ORDER — LABETALOL HCL 5 MG/ML IV SOLN: 10.0000 mg | INTRAVENOUS | Status: DC | PRN

## 2020-04-21 MED ORDER — DOCUSATE SODIUM 100 MG PO CAPS
100.0000 mg | ORAL_CAPSULE | Freq: Two times a day (BID) | ORAL | Status: DC
  Administered 2020-04-21 – 2020-04-23 (×5): 100 mg via ORAL
  Filled 2020-04-21 (×5): qty 1

## 2020-04-21 MED ORDER — ENOXAPARIN SODIUM 60 MG/0.6ML ~~LOC~~ SOLN
55.0000 mg | SUBCUTANEOUS | Status: DC
  Administered 2020-04-21 – 2020-04-22 (×2): 55 mg via SUBCUTANEOUS
  Filled 2020-04-21: qty 0.55
  Filled 2020-04-21: qty 0.6

## 2020-04-21 MED ORDER — SENNOSIDES-DOCUSATE SODIUM 8.6-50 MG PO TABS: 1.0000 | ORAL_TABLET | Freq: Every evening | ORAL | Status: DC | PRN

## 2020-04-21 MED ORDER — THIAMINE HCL 100 MG/ML IJ SOLN: 100.0000 mg | Freq: Once | INTRAMUSCULAR | Status: DC

## 2020-04-21 MED ORDER — FOLIC ACID 1 MG PO TABS
1.0000 mg | ORAL_TABLET | Freq: Once | ORAL | Status: AC
  Administered 2020-04-21: 1 mg via ORAL
  Filled 2020-04-21: qty 1

## 2020-04-21 MED ORDER — ONDANSETRON 4 MG PO TBDP: 4.0000 mg | ORAL_TABLET | Freq: Four times a day (QID) | ORAL | Status: DC | PRN

## 2020-04-21 MED ORDER — FOLIC ACID 1 MG PO TABS
1.0000 mg | ORAL_TABLET | Freq: Every day | ORAL | Status: DC
  Administered 2020-04-22 – 2020-04-23 (×2): 1 mg via ORAL
  Filled 2020-04-21 (×2): qty 1

## 2020-04-21 MED ORDER — LORAZEPAM 2 MG/ML IJ SOLN
1.0000 mg | Freq: Once | INTRAMUSCULAR | Status: AC
  Administered 2020-04-21: 1 mg via INTRAVENOUS
  Filled 2020-04-21: qty 1

## 2020-04-21 MED ORDER — THIAMINE HCL 100 MG/ML IJ SOLN
100.0000 mg | Freq: Once | INTRAMUSCULAR | Status: DC
  Filled 2020-04-21: qty 2

## 2020-04-21 MED ORDER — CLONIDINE HCL 0.1 MG PO TABS
0.1000 mg | ORAL_TABLET | Freq: Two times a day (BID) | ORAL | Status: DC
  Administered 2020-04-21 – 2020-04-23 (×5): 0.1 mg via ORAL
  Filled 2020-04-21 (×5): qty 1

## 2020-04-21 MED ORDER — THIAMINE HCL 100 MG/ML IJ SOLN: 100.0000 mg | Freq: Every day | INTRAMUSCULAR | Status: DC

## 2020-04-21 MED ORDER — LOPERAMIDE HCL 2 MG PO CAPS: 2.0000 mg | ORAL_CAPSULE | ORAL | Status: DC | PRN

## 2020-04-21 MED ORDER — ADULT MULTIVITAMIN W/MINERALS CH: 1.0000 | ORAL_TABLET | Freq: Every day | ORAL | Status: DC

## 2020-04-21 MED ORDER — BISACODYL 10 MG RE SUPP: 10.0000 mg | Freq: Every day | RECTAL | Status: DC | PRN

## 2020-04-21 MED ORDER — LORAZEPAM 1 MG PO TABS: 0.0000 mg | ORAL_TABLET | Freq: Two times a day (BID) | ORAL | Status: DC

## 2020-04-21 MED ORDER — LORAZEPAM 1 MG PO TABS
1.0000 mg | ORAL_TABLET | ORAL | Status: DC | PRN
  Administered 2020-04-22: 2 mg via ORAL
  Administered 2020-04-22 – 2020-04-23 (×3): 1 mg via ORAL
  Filled 2020-04-21: qty 1
  Filled 2020-04-21: qty 2
  Filled 2020-04-21 (×2): qty 1

## 2020-04-21 NOTE — ED Triage Notes (Addendum)
PATIENT [name], reports anxiety for past several days along with alcohol problem. History of ptsd - patient reportedly woke up around 2 am and realized something "wasn't quite right" . Wants help. lexapro and ativan in past but no longer takes because he ran out. Patient states family will not take him to appointment to get medications. Patient denies SI/HI

## 2020-04-21 NOTE — H&P (Signed)
History and Physical    [name]:[id number] DOB: 1971-06-02 DOA: 04/21/2020  PCP: Patient, No Pcp Per.  Confirmed Patient coming from: Home  Chief Complaint: Alcohol withdrawal  HPI: [name] is a 49 y.o. male with history of alcohol abuse, HTN (not on meds), anxiety, depression, bipolar disorder, PTSD and morbid obesity presenting with the above chief complaint.  Patient reports binge drinking over the last 3 days until 9 PM last night.  He thinks he might have 10 of the 24 ounce cans of beer yesterday.  He woke up about 5 AM this morning with palpitation, tremor and visual hallucination.  He thinks he might have alcohol-related withdrawal in the past although he describes these as tremor and jerking.  He denies headache, vision change, focal neuro symptoms, fever, chills, URI symptoms, cough, shortness of breath, nausea, vomiting, abdominal pain, diarrhea or UTI symptoms.  He had transient twinging pain about about bilateral subcostal areas while in the waiting room in ED that has resolved.  Patient is unvaccinated against COVID-19.  He denies known COVID-19 exposure.  Patient denies smoking cigarettes or recreational drug use.  In ED, BP 198/113> 180/130.  HR 108> 100.  Mild temp to 99.4.  95% on RA. Ca 7.9.  Otherwise, CMP and CBC with differential without significant finding.  High-sensitivity troponin negative.  UDS negative.  CXR without acute finding.  Twelve-lead EKG with sinus tachycardia to 101 and PVCs.  EtOH level 15.  Tylenol and salicylate level negative.  Started on Ativan and hospitalist service was called for admission for alcohol withdrawal with elevated CIWA to 20.   ROS All review of system negative except for pertinent positives and negatives as history of present illness above.  PMH Past Medical History:  Diagnosis Date  . Anxiety   . Asthma   . Atrial fibrillation (HCC)   . Bipolar 1 disorder (HCC)   . Depression   . Heart palpitations    with  severe anxiety  . Hypertension   . PTSD (post-traumatic stress disorder)    PSH Past Surgical History:  Procedure Laterality Date  . NO PAST SURGERIES     Fam HX Denies family history of alcohol abuse  Social Hx  reports that he has never smoked. He has never used smokeless tobacco. He reports current alcohol use. He reports that he does not use drugs.  Allergy No Known Allergies Home Meds Prior to Admission medications   Medication Sig Start Date End Date Taking? Authorizing Provider  B Complex-C (B-COMPLEX WITH VITAMIN C) tablet Take 1 tablet by mouth daily.   Yes [provider]  diphenhydrAMINE (BENADRYL) 25 MG tablet Take 25 mg by mouth every 6 (six) hours as needed for itching or allergies.   Yes [provider]  polycarbophil (FIBERCON) 625 MG tablet Take 625 mg by mouth daily.   Yes [provider]  busPIRone (BUSPAR) 10 MG tablet Take 1 tablet (10 mg total) by mouth 3 (three) times daily. Patient not taking: Reported on 07/16/2019 03/10/17   Raeford Razor, MD  chlordiazePOXIDE (LIBRIUM) 25 MG capsule 50mg  PO TID x 1D, then 25-50mg  PO BID X 1D, then 25-50mg  PO QD X 1D Patient not taking: Reported on 04/21/2020 10/10/19   12/08/19, MD  escitalopram (LEXAPRO) 5 MG tablet Take 1 tablet (5 mg total) by mouth daily. Patient not taking: Reported on 04/21/2020 09/07/19   11/05/19, MD  gabapentin (NEURONTIN) 300 MG capsule Take 1 capsule (300 mg total)  by mouth 3 (three) times daily. Patient not taking: Reported on 07/16/2019 01/18/17   Charm Rings, NP  hydrOXYzine (ATARAX/VISTARIL) 25 MG tablet Take 1 tablet (25 mg total) by mouth every 8 (eight) hours as needed. Patient not taking: Reported on 04/21/2020 07/16/19   Caccavale, Sophia, PA-C  lisinopril (PRINIVIL,ZESTRIL) 10 MG tablet Take 1 tablet (10 mg total) by mouth daily. Patient not taking: Reported on 07/16/2019 03/10/17   Raeford Razor, MD  naproxen (NAPROSYN) 500 MG tablet Take  1 tablet (500 mg total) by mouth 2 (two) times daily. Patient not taking: Reported on 07/16/2019 01/26/17   Shon Baton, MD    Physical Exam: Vitals:   04/21/20 0723 04/21/20 0850 04/21/20 0951 04/21/20 1102  BP: (!) 198/113 (!) 188/130  (!) 180/130  Pulse: (!) 108 (!) 106 100 100  Resp: 17 20  19   Temp: 99.4 F (37.4 C) 98.9 F (37.2 C)    TempSrc: Oral     SpO2: 94% 95%  95%  Weight:      Height:        GENERAL: No acute distress.  Appears well.  HEENT: MMM.  Vision and hearing grossly intact.  NECK: Supple.  No apparent JVD.  RESP: On RA.  No IWOB. Good air movement bilaterally. CVS: Tachycardic to 110.  Regular rhythm.  Heart sounds normal.  ABD/GI/GU: Bowel sounds present. Soft. Non tender.  MSK/EXT:  Moves extremities. No apparent deformity or edema.  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  Tremors in both arms. PSYCH: Somewhat anxious.  Endorses visual hallucination.  Denies SI or HI.   Personally Reviewed Radiological Exams DG Chest 1 View  Result Date: 04/21/2020 CLINICAL DATA:  Anxiety. EXAM: CHEST  1 VIEW COMPARISON:  June 29, 2017 FINDINGS: The heart size and mediastinal contours are within normal limits. Both lungs are clear. The visualized skeletal structures are unremarkable. IMPRESSION: No active disease. Electronically Signed   By: July 01, 2017 III M.D   On: 04/21/2020 10:30     Personally Reviewed Labs: CBC: Recent Labs  Lab 04/21/20 0950  WBC 7.2  NEUTROABS 5.5  HGB 14.1  HCT 43.7  MCV 84.9  PLT 260   Basic Metabolic Panel: Recent Labs  Lab 04/21/20 0950  NA 137  K 4.0  CL 102  CO2 23  GLUCOSE 97  BUN 6  CREATININE 0.65  CALCIUM 7.9*  MG 2.1   GFR: Estimated Creatinine Clearance: 134.3 mL/min (by C-G formula based on SCr of 0.65 mg/dL). Liver Function Tests: Recent Labs  Lab 04/21/20 0950  AST 26  ALT 19  ALKPHOS 99  BILITOT 0.5  PROT  7.7  ALBUMIN 4.3   No results for input(s): LIPASE, AMYLASE in the last 168 hours. No results for input(s): AMMONIA in the last 168 hours. Coagulation Profile: No results for input(s): INR, PROTIME in the last 168 hours. Cardiac Enzymes: No results for input(s): CKTOTAL, CKMB, CKMBINDEX, TROPONINI in the last 168 hours. BNP (last 3 results) No results for input(s): PROBNP in the last 8760 hours. HbA1C: No results for input(s): HGBA1C in the last 72 hours. CBG: No results for input(s): GLUCAP in the last 168 hours. Lipid Profile: No results for input(s): CHOL, HDL, LDLCALC, TRIG, CHOLHDL, LDLDIRECT in the last 72 hours. Thyroid Function Tests: No results for input(s): TSH, T4TOTAL, FREET4, T3FREE, THYROIDAB in the last 72 hours. Anemia Panel: No results for input(s): VITAMINB12, FOLATE, FERRITIN, TIBC, IRON,  RETICCTPCT in the last 72 hours. Urine analysis:    Component Value Date/Time   COLORURINE YELLOW 07/16/2019 1740   APPEARANCEUR HAZY (A) 07/16/2019 1740   LABSPEC 1.011 07/16/2019 1740   PHURINE 8.0 07/16/2019 1740   GLUCOSEU NEGATIVE 07/16/2019 1740   HGBUR NEGATIVE 07/16/2019 1740   BILIRUBINUR NEGATIVE 07/16/2019 1740   KETONESUR NEGATIVE 07/16/2019 1740   PROTEINUR NEGATIVE 07/16/2019 1740   UROBILINOGEN 0.2 04/09/2015 0206   NITRITE NEGATIVE 07/16/2019 1740   LEUKOCYTESUR NEGATIVE 07/16/2019 1740    Sepsis Labs:  None  Personally Reviewed EKG:  12-lead EKG with sinus tachycardia to 101 and PVCs.  Assessment/Plan Alcohol withdrawal syndrome-presented with palpitation, hallucination, tremor and tachycardia.  Last drink about 12 hours prior to presentation.  EtOH level 15.  UDS negative.  Basic labs including CMP and CBC unremarkable except for hypocalcemia. -Detox with Librium taper-we will load with 25 mg milligrams followed by 10 mg taper -CIWA monitoring with as needed Ativan -Multivitamins, folic acid and thiamine -Monitor electrolytes and replenish as  appropriate -Consult TOC for resources  Hypocalcemia: Calcium 7.9.  Albumin is 4.3. -2 g IV calcium gluconate  Uncontrolled hypertension/hypertensive urgency/tachycardia-likely due to alcohol and noncompliance. -Continue home lisinopril at 20 mg daily -Add low-dose clonidine -As needed labetalol  Anxiety/depression/bipolar disorder/PTSD: Somewhat anxious with visual hallucination likely from alcohol.  Not compliant with home medications. -Monitor alcohol withdrawal as above -Consider resuming gabapentin, Lexapro and BuSpar prior to discharge   Class II obesity Body mass index is 38.37 kg/m. -Check hemoglobin A1c, TSH and fasting lipid panel  DVT prophylaxis: Subcu Lovenox  Code Status: Full code Family Communication: Patient declined  Disposition Plan: Admit to telemetry Consults called: None Admission status: Inpatient  Severity of Illness: The appropriate patient status for this patient is INPATIENT. Inpatient status is judged to be reasonable and necessary in order to provide the required intensity of service to ensure the patient's safety. The patient's presenting symptoms, physical exam findings, and initial radiographic and laboratory data in the context of their chronic comorbidities is felt to place them at high risk for further clinical deterioration. Furthermore, it is not anticipated that the patient will be medically stable for discharge from the hospital within 2 midnights of admission. The following factors support the patient status of inpatient.    "           The patient's presenting symptoms include palpitation, tremor and visual hallucination "           The worrisome physical exam findings include tachycardia, markedly elevated BP, tremor, hallucination "           The initial radiographic and laboratory data are worrisome because hypocalcemia "           The chronic co-morbidities include morbid obesity, uncontrolled HTN, anxiety/depression/bipolar  disorder/PTSD     I certify that at the point of admission it is my clinical judgment that the patient will require inpatient hospital care spanning beyond 2 midnights from the point of admission due to high intensity of service, high risk for further deterioration and high frequency of surveillance required.   Almon Hercules MD [facility]  If 7PM-7AM, please contact night-coverage [email or web address]  04/21/2020, 12:37 PM

## 2020-04-21 NOTE — ED Provider Notes (Addendum)
[facility]-EMERGENCY DEPT Provider Note   CSN: [id number] Arrival date & time: 04/21/20  7124     History Chief Complaint  Patient presents with  . Anxiety  . Alcohol Problem    [name] is a 49 y.o. male history of obesity, atrial fibrillation, anxiety, asthma, bipolar, hypertension, polysubstance abuse, alcohol withdrawal.  Patient presents today for concern of alcohol withdrawal.  He reports that he has been on an alcohol bender for the past 3 days but stopped drinking around 9 PM last night.  He reports since that time he has felt unwell, he reports tremors, visual hallucinations, anxiety and diaphoresis.  He reports history of alcohol withdrawal with seizure in the past.  He reports that while in the waiting room around 2 hours ago he had a minor right-sided chest pain a aching sensation lasting a few minutes that resolved spontaneously, pain did not radiate.  He denies any current chest pain at this time.  Denies fevers/chills, fall/injury, headache, neck stiffness, chest pain, shortness of breath, vomiting, diarrhea, abdominal pain or any additional concerns.   HPI     Past Medical History:  Diagnosis Date  . Anxiety   . Asthma   . Atrial fibrillation (HCC)   . Bipolar 1 disorder (HCC)   . Depression   . Heart palpitations    with severe anxiety  . Hypertension   . PTSD (post-traumatic stress disorder)     Patient Active Problem List   Diagnosis Date Noted  . Alcohol abuse with alcohol-induced mood disorder (HCC) 01/18/2017  . Cocaine dependence with cocaine-induced mood disorder (HCC)   . Alcohol use disorder, severe, dependence (HCC) 04/12/2015  . Hypertension 09/08/2011  . Post traumatic stress disorder (PTSD) 09/08/2011    Past Surgical History:  Procedure Laterality Date  . NO PAST SURGERIES         No family history on file.  Social History   Tobacco Use  . Smoking status: Never Smoker  . Smokeless tobacco: Never  Used  Vaping Use  . Vaping Use: Never used  Substance Use Topics  . Alcohol use: Yes    Comment: 4- 24oz beer daily  . Drug use: No    Comment: Denies 06/27/15    Home Medications Prior to Admission medications   Medication Sig Start Date End Date Taking? Authorizing Provider  B Complex-C (B-COMPLEX WITH VITAMIN C) tablet Take 1 tablet by mouth daily.   Yes [provider]  diphenhydrAMINE (BENADRYL) 25 MG tablet Take 25 mg by mouth every 6 (six) hours as needed for itching or allergies.   Yes [provider]  polycarbophil (FIBERCON) 625 MG tablet Take 625 mg by mouth daily.   Yes [provider]  busPIRone (BUSPAR) 10 MG tablet Take 1 tablet (10 mg total) by mouth 3 (three) times daily. Patient not taking: Reported on 07/16/2019 03/10/17   Raeford Razor, MD  chlordiazePOXIDE (LIBRIUM) 25 MG capsule 50mg  PO TID x 1D, then 25-50mg  PO BID X 1D, then 25-50mg  PO QD X 1D Patient not taking: Reported on 04/21/2020 10/10/19   12/08/19, MD  escitalopram (LEXAPRO) 5 MG tablet Take 1 tablet (5 mg total) by mouth daily. Patient not taking: Reported on 04/21/2020 09/07/19   11/05/19, MD  gabapentin (NEURONTIN) 300 MG capsule Take 1 capsule (300 mg total) by mouth 3 (three) times daily. Patient not taking: Reported on 07/16/2019 01/18/17   01/20/17, NP  hydrOXYzine (ATARAX/VISTARIL) 25 MG tablet Take  1 tablet (25 mg total) by mouth every 8 (eight) hours as needed. Patient not taking: Reported on 04/21/2020 07/16/19   Caccavale, Sophia, PA-C  lisinopril (PRINIVIL,ZESTRIL) 10 MG tablet Take 1 tablet (10 mg total) by mouth daily. Patient not taking: Reported on 07/16/2019 03/10/17   Raeford Razor, MD  naproxen (NAPROSYN) 500 MG tablet Take 1 tablet (500 mg total) by mouth 2 (two) times daily. Patient not taking: Reported on 07/16/2019 01/26/17   Horton, Mayer Masker, MD    Allergies    Patient has no known allergies.  Review of Systems   Review of  Systems Ten systems are reviewed and are negative for acute change except as noted in the HPI  Physical Exam Updated Vital Signs BP (!) 180/130   Pulse 100   Temp 98.9 F (37.2 C)   Resp 19   Ht 5\' 7"  (1.702 m)   Wt 111.1 kg   SpO2 95%   BMI 38.37 kg/m   Physical Exam Constitutional:      General: He is not in acute distress.    Appearance: Normal appearance. He is well-developed. He is obese. He is ill-appearing and diaphoretic. He is not toxic-appearing.  HENT:     Head: Normocephalic and atraumatic.     Right Ear: External ear normal.     Left Ear: External ear normal.     Nose: Nose normal.     Mouth/Throat:     Mouth: Mucous membranes are moist.     Pharynx: Oropharynx is clear.  Eyes:     General: Vision grossly intact. Gaze aligned appropriately.     Pupils: Pupils are equal, round, and reactive to light.  Neck:     Trachea: Trachea and phonation normal.  Cardiovascular:     Rate and Rhythm: Regular rhythm. Tachycardia present.  Pulmonary:     Effort: Pulmonary effort is normal. No respiratory distress.     Breath sounds: Normal breath sounds.  Abdominal:     General: There is no distension.     Palpations: Abdomen is soft.     Tenderness: There is no abdominal tenderness. There is no guarding or rebound.  Musculoskeletal:        General: Normal range of motion.     Cervical back: Normal range of motion.  Skin:    General: Skin is warm.  Neurological:     Mental Status: He is alert.     GCS: GCS eye subscore is 4. GCS verbal subscore is 5. GCS motor subscore is 6.     Comments: Speech is clear and goal oriented, follows commands Major Cranial nerves without deficit, no facial droop Moves extremities without ataxia, coordination intact  Psychiatric:        Attention and Perception: He perceives visual hallucinations. He does not perceive auditory hallucinations.        Mood and Affect: Mood is anxious.        Behavior: Behavior is hyperactive. Behavior is  cooperative.        Thought Content: Thought content does not include homicidal or suicidal ideation.        Cognition and Memory: Cognition normal.     ED Results / Procedures / Treatments   Labs (all labs ordered are listed, but only abnormal results are displayed) Labs Reviewed  COMPREHENSIVE METABOLIC PANEL - Abnormal; Notable for the following components:      Result Value   Calcium 7.9 (*)    All other components within normal limits  ETHANOL -  Abnormal; Notable for the following components:   Alcohol, Ethyl (B) 15 (*)    All other components within normal limits  SALICYLATE LEVEL - Abnormal; Notable for the following components:   Salicylate Lvl <7.0 (*)    All other components within normal limits  ACETAMINOPHEN LEVEL - Abnormal; Notable for the following components:   Acetaminophen (Tylenol), Serum <10 (*)    All other components within normal limits  SARS CORONAVIRUS 2 BY RT PCR (HOSPITAL ORDER, PERFORMED IN [facility])  CBC WITH DIFFERENTIAL/PLATELET  RAPID URINE DRUG SCREEN, HOSP PERFORMED  MAGNESIUM  TROPONIN I (HIGH SENSITIVITY)    EKG EKG Interpretation  Date/Time:  Saturday April 21 2020 09:43:11 EDT Ventricular Rate:  101 PR Interval:    QRS Duration: 110 QT Interval:  379 QTC Calculation: 492 R Axis:   16 Text Interpretation: Sinus tachycardia Ventricular bigeminy Anterior infarct, old Borderline T abnormalities, inferior leads Baseline wander in lead(s) I II aVR aVL Motion artifact due to tremors, occasional PVC, no STEMI Confirmed by Alvester Chou [phone]) on 04/21/2020 10:32:03 AM   Radiology DG Chest 1 View  Result Date: 04/21/2020 CLINICAL DATA:  Anxiety. EXAM: CHEST  1 VIEW COMPARISON:  June 29, 2017 FINDINGS: The heart size and mediastinal contours are within normal limits. Both lungs are clear. The visualized skeletal structures are unremarkable. IMPRESSION: No active disease. Electronically Signed   By: Gerome Sam III M.D    On: 04/21/2020 10:30    Procedures Procedures (including critical care time)  Medications Ordered in ED Medications  LORazepam (ATIVAN) injection 0-4 mg (2 mg Intravenous Given 04/21/20 1103)    Or  LORazepam (ATIVAN) tablet 0-4 mg ( Oral See Alternative 04/21/20 1103)  LORazepam (ATIVAN) injection 0-4 mg (has no administration in time range)    Or  LORazepam (ATIVAN) tablet 0-4 mg (has no administration in time range)  thiamine tablet 100 mg ( Oral See Alternative 04/21/20 0950)    Or  thiamine (B-1) injection 100 mg (100 mg Intravenous Given 04/21/20 0950)  LORazepam (ATIVAN) injection 1 mg (1 mg Intravenous Given 04/21/20 0953)  folic acid (FOLVITE) tablet 1 mg (1 mg Oral Given 04/21/20 0950)  sodium chloride 0.9 % bolus 500 mL (0 mLs Intravenous Stopped 04/21/20 1104)    ED Course  I have reviewed the triage vital signs and the nursing notes.  Pertinent labs & imaging results that were available during my care of the patient were reviewed by me and considered in my medical decision making (see chart for details).  Clinical Course as of Apr 21 1126  Sat Apr 21, 2020  0954 49 yo male w/ hx of etoh use disorder, obesity, presenting with tremulousness after a 4 day drinking binge.  Reports he's been drinking malt liquor for the past 4 days, last drink around 10 pm last nigh.t  This morning he is feeling shaky, jittery, having some intermittent right sided chest pain.  Feels like withdrawal.  He reports to me he is unsure if he's had a seizure before, but told PA provider he may have.  On exam he is mildly tachycardia, tremulous, has some asterixes.  He is not diaphoretic or responding to internal stimuli, but reports he is seeing some "things in the corners of my eyes."  Plan to give ativan, initiate CIWA protocol, check labs including trop, dg chest, ecg for CP evaluation, etoh level.  Will reassess after labwork to determine need for inpatient mgmt vs librium treatment as outpatient.    [  MT]    Clinical Course User Index [MT] Trifan, Kermit BaloMatthew J, MD   MDM Rules/Calculators/A&P                         Additional history obtained from: 1. Nursing notes from this visit. 2. Review of electronic medical record.  Patient with last ED visit on October 10, 2019, diagnosis of uncomplicated alcohol withdrawal.  Patient with 4 visits for EtOH abuse in the past year. ----------------------------------------------------- 9:27 AM: Initial evaluation patient appears to be in acute alcohol withdrawal, last drink over 12 hours ago.  History of alcohol withdrawal with seizure.  Ativan ordered along with medical clearance labs.  He endorsed some chest pain earlier today which has since resolved will obtain troponin EKG and chest x-ray as well.  Currently chest pain-free. - 10:30 AM: Patient reassessed, remains tremulous reports some improvement of symptoms following Ativan.  Requesting additional dose which I feel is appropriate, message sent to RN.  Lab work has begun to return, initial high-sensitivity troponin within normal limits, low suspicion for ACS.  Delta troponin ordered.  No active chest pain, no shortness of breath, suspicion for dissection, PE or other acute cardiopulmonary etiologies at this time.  Salicylate and Tylenol levels negative, no evidence of ingestion of those substances.  Ethanol low at 15 suggestive of alcohol withdrawal.  CBC within normal limits, no leukocytosis to suggest infection and no anemia.  CMP shows no emergent electrolyte derangement, AKI, LFT elevation or gap; calcium slightly low at 7.9.  CXR:  IMPRESSION:  No active disease.   EKG: Sinus tachycardia Ventricular bigeminy Anterior infarct, old Borderline T abnormalities, inferior leads Baseline wander in lead(s) I II aVR aVL Motion artifact due to tremors, occasional PVC, no STEMI Confirmed by Alvester Chourifan, Matthew [phone] on 04/21/2020 10:32:03 AM - CIWA greater than 20, will consult hospitalist for admission.   Covid test, UDS and mag level pending.  11:26 AM: On reassessment patient appears more comfortable after additional 2 mg of Ativan, still mildly tachycardic and hypertensive.  Reports he is feeling somewhat improved after additional Ativan, and he is agreeable for admission. Discussed case with Dr. Renaye Rakersrifan who has seen and evaluated the patient and agrees. - 11:37 AM: Discussed case with Dr. Alanda SlimGonfa, patient accepted to hospitalist service.   Note: Portions of this report may have been transcribed using voice recognition software. Every effort was made to ensure accuracy; however, inadvertent computerized transcription errors may still be present. Final Clinical Impression(s) / ED Diagnoses Final diagnoses:  Palpitation  Alcohol withdrawal syndrome with perceptual disturbance [facility](HCC)    Rx / DC Orders ED Discharge Orders    None        Elizabeth PalauMorelli, Larin Depaoli A, PA-C 04/21/20 1139    Terald Sleeperrifan, Matthew J, MD 04/21/20 1334

## 2020-04-21 NOTE — TOC Initial Note (Signed)
Transition of Care [facility]) - Initial/Assessment Note    Patient Details  Name: [name] MRN: [id number] Date of Birth: 1970/09/26  Transition of Care [facility]) CM/SW Contact:    Elliot Gurney [place], [place] Phone Number: [phone] 04/21/2020, 2:29 PM  Clinical Narrative:                 Social work met with patient to offer substance abuse resources. Patient is a 49 year old man currently in the ED w/ hx of etoh use disorder, obesity, presenting with tremulousness after a 4 day drinking binge. Patient discussed having a long history of substance use with rehab stays x3 in 2014 and 2021. Patient discussed a history of mental health treatment as well but is not currently in treatment. Patient is currently unemployed and receiving disability. Patient describes minimal family support "my family is dysfunctional"  but does report living in the attic of his parents home. Patient discussed increased difficulty managing his alcohol use and states that he knows that he needs to get back into treatment. Substance abuse resources provided .  Transition of care to continue to follow for discharge needs  Expected Discharge Plan:  (pending) Barriers to Discharge: No Barriers Identified   Patient Goals and CMS Choice Patient states their goals for this hospitalization and ongoing recovery are:: "I know that I need to get back into treatment"      Expected Discharge Plan and Services Expected Discharge Plan:  (pending) In-house Referral: Clinical Social Work                                            Prior Living Arrangements/Services       Do you feel safe going back to the place where you live?: Yes      Need for Family Participation in Patient Care: No (Comment) Care giver support system in place?: No (comment)   Criminal Activity/Legal Involvement Pertinent to Current Situation/Hospitalization: No - Comment as needed  Activities of Daily Living      Permission  Sought/Granted                  Emotional Assessment Appearance:: Appears older than stated age Attitude/Demeanor/Rapport: Gracious, Engaged Affect (typically observed): Accepting, Adaptable Orientation: : Oriented to Self, Oriented to Place, Oriented to Situation Alcohol / Substance Use: Alcohol Use Psych Involvement: No (comment)  Admission diagnosis:  Alcohol withdrawal ([place]) [F10.239] Patient Active Problem List   Diagnosis Date Noted  . Alcohol withdrawal ([place]) 04/21/2020  . Alcohol abuse with alcohol-induced mood disorder ([place]) 01/18/2017  . Cocaine dependence with cocaine-induced mood disorder ([place])   . Alcohol use disorder, severe, dependence ([place]) 04/12/2015  . Hypertension 09/08/2011  . Post traumatic stress disorder (PTSD) 09/08/2011   PCP:  Patient, No Pcp Per Pharmacy:   [facility] [place], [place] [address] [place] [address] Phone: [phone] Fax: [phone]     Social Determinants of Health (SDOH) Interventions    Readmission Risk Interventions No flowsheet data found.

## 2020-04-21 NOTE — Plan of Care (Signed)
Education continues r/t ETOH.

## 2020-04-21 NOTE — ED Notes (Signed)
Delay in giving meds, social work consult taking place at this time. Will give meds as soon as consult is done.

## 2020-04-22 LAB — COMPREHENSIVE METABOLIC PANEL
ALT: 17 U/L (ref 0–44)
AST: 17 U/L (ref 15–41)
Albumin: 3.5 g/dL (ref 3.5–5.0)
Alkaline Phosphatase: 82 U/L (ref 38–126)
Anion gap: 7 (ref 5–15)
BUN: 10 mg/dL (ref 6–20)
CO2: 28 mmol/L (ref 22–32)
Calcium: 8.4 mg/dL — ABNORMAL LOW (ref 8.9–10.3)
Chloride: 105 mmol/L (ref 98–111)
Creatinine, Ser: 0.73 mg/dL (ref 0.61–1.24)
GFR calc Af Amer: >60 mL/min (ref >60)
GFR calc non Af Amer: >60 mL/min (ref >60)
Glucose, Bld: 104 mg/dL — ABNORMAL HIGH (ref 70–99)
Potassium: 3.7 mmol/L (ref 3.5–5.1)
Sodium: 140 mmol/L (ref 135–145)
Total Bilirubin: 0.7 mg/dL (ref 0.3–1.2)
Total Protein: 6.5 g/dL (ref 6.5–8.1)

## 2020-04-22 LAB — CBC
HCT: 40.8 % (ref 39.0–52.0)
Hemoglobin: 13 g/dL (ref 13.0–17.0)
MCH: 27.5 pg (ref 26.0–34.0)
MCHC: 31.9 g/dL (ref 30.0–36.0)
MCV: 86.4 fL (ref 80.0–100.0)
Platelets: 226 10*3/uL (ref 150–400)
RBC: 4.72 MIL/uL (ref 4.22–5.81)
RDW: 14.1 % (ref 11.5–15.5)
WBC: 5.3 10*3/uL (ref 4.0–10.5)
nRBC: 0 % (ref 0.0–0.2)

## 2020-04-22 LAB — PHOSPHORUS: Phosphorus: 3.5 mg/dL (ref 2.5–4.6)

## 2020-04-22 LAB — MAGNESIUM: Magnesium: 2.3 mg/dL (ref 1.7–2.4)

## 2020-04-22 NOTE — Progress Notes (Signed)
PROGRESS NOTE    [name]  XLK:[id number] DOB: 20-Oct-1970 DOA: 04/21/2020 PCP: Patient, No Pcp Per  Brief Narrative:  [name] is a 49 y.o. male with history of alcohol abuse, HTN (not on meds), anxiety, depression, bipolar disorder, PTSD and morbid obesity presenting with alcohol withdrawal symptoms.  Patient reports binge drinking over the last 3 days until 9 PM last night.  He thinks he might have 10 of the 24 ounce cans of beer yesterday.  He woke up about 5 AM this morning with palpitation, tremor and visual hallucination.  He thinks he might have alcohol-related withdrawal in the past although he describes these as tremor and jerking.  He denies headache, vision change, focal neuro symptoms, fever, chills, URI symptoms, cough, shortness of breath, nausea, vomiting, abdominal pain, diarrhea or UTI symptoms.  He had transient twinging pain about about bilateral subcostal areas while in the waiting room in ED that has resolved.  Patient is unvaccinated against COVID-19.  He denies known COVID-19 exposure. In ED, BP 198/113> 180/130.  HR 108> 100.  Mild temp to 99.4.  95% on RA. Ca 7.9.  Otherwise, CMP and CBC with differential without significant finding.  High-sensitivity troponin negative.  UDS negative.  CXR without acute finding.  Twelve-lead EKG with sinus tachycardia to 101 and PVCs.  EtOH level 15.  Tylenol and salicylate level negative.  Started on Ativan and hospitalist service was called for admission for alcohol withdrawal with elevated CIWA to 20.  He is admitted for alcohol withdrawal started on CIWA protocol.  Assessment & Plan:   Active Problems:   Alcohol withdrawal (HCC)  Alcohol withdrawal syndrome-presented with palpitation, hallucination, tremor and tachycardia.  Last drink about 12 hours prior to presentation.  EtOH level 15.  UDS negative.  Basic labs including CMP and CBC unremarkable except for hypocalcemia. -Detox with Librium taper-we will load with 25 mg  milligrams followed by 10 mg taper -CIWA monitoring with as needed Ativan. -Multivitamins, folic acid and thiamine. -Monitor electrolytes and replenish as appropriate. -Consult TOC for resources.  Hypocalcemia: Calcium 7.9.  Albumin is 4.3. -2 g IV calcium gluconate  Uncontrolled hypertension/hypertensive urgency/tachycardia-likely due to alcohol and noncompliance. -Continue home lisinopril at 20 mg daily -Add low-dose clonidine -As needed labetalol  Anxiety/depression/bipolar disorder/PTSD: Somewhat anxious with visual hallucination likely from alcohol.  Not compliant with home medications. -Monitor alcohol withdrawal as above -Consider resuming gabapentin, Lexapro and BuSpar prior to discharge   Class II obesity Body mass index is 38.37 kg/m. -Check hemoglobin A1c, TSH and fasting lipid panel  DVT prophylaxis: Subcu Lovenox     Code Status: Full code Family Communication: No one at bedside. Disposition Plan: Admit to telemetry Consults called: None Admission status: Inpatient  Consultants:    None  Procedures:   Antimicrobials:  Anti-infectives (From admission, onward)   None      Subjective: Patient was seen/ examined at bed side, no overnight events, sleeping with loud snoring.   Objective: Vitals:   04/21/20 1615 04/21/20 1951 04/22/20 0527 04/22/20 1326  BP: (!) 150/99 133/61 (!) 145/97 (!) 146/97  Pulse: (!) 104 85 78 82  Resp: 20 20 16 16   Temp: 99.4 F (37.4 C) 98.4 F (36.9 C) 98.3 F (36.8 C) 97.7 F (36.5 C)  TempSrc: Oral Oral Oral Oral  SpO2: 96% 96% 96% 98%  Weight:      Height:        Intake/Output Summary (Last 24 hours) at 04/22/2020 1340 Last data filed  at 04/22/2020 0800 Gross per 24 hour  Intake 480 ml  Output --  Net 480 ml   Filed Weights   04/21/20 0720  Weight: 111.1 kg    Examination:  General exam: Appears calm and comfortable  Respiratory system: Clear to auscultation. Respiratory effort  normal. Cardiovascular system: S1 & S2 heard, RRR. No JVD, murmurs, rubs, gallops or clicks. No pedal edema. Gastrointestinal system: Abdomen is nondistended, soft and nontender. No organomegaly or masses felt. Normal bowel sounds heard. Central nervous system: Alert and oriented. No focal neurological deficits. Extremities: Symmetric 5 x 5 power. Skin: No rashes, lesions or ulcers Psychiatry: Judgement and insight appear normal. Mood & affect appropriate.     Data Reviewed: I have personally reviewed following labs and imaging studies  CBC: Recent Labs  Lab 04/21/20 0950 04/22/20 0315  WBC 7.2 5.3  NEUTROABS 5.5  --   HGB 14.1 13.0  HCT 43.7 40.8  MCV 84.9 86.4  PLT 260 226   Basic Metabolic Panel: Recent Labs  Lab 04/21/20 0950 04/22/20 0315  NA 137 140  K 4.0 3.7  CL 102 105  CO2 23 28  GLUCOSE 97 104*  BUN 6 10  CREATININE 0.65 0.73  CALCIUM 7.9* 8.4*  MG 2.1 2.3  PHOS  --  3.5   GFR: Estimated Creatinine Clearance: 134.3 mL/min (by C-G formula based on SCr of 0.73 mg/dL). Liver Function Tests: Recent Labs  Lab 04/21/20 0950 04/22/20 0315  AST 26 17  ALT 19 17  ALKPHOS 99 82  BILITOT 0.5 0.7  PROT 7.7 6.5  ALBUMIN 4.3 3.5   No results for input(s): LIPASE, AMYLASE in the last 168 hours. No results for input(s): AMMONIA in the last 168 hours. Coagulation Profile: No results for input(s): INR, PROTIME in the last 168 hours. Cardiac Enzymes: No results for input(s): CKTOTAL, CKMB, CKMBINDEX, TROPONINI in the last 168 hours. BNP (last 3 results) No results for input(s): PROBNP in the last 8760 hours. HbA1C: No results for input(s): HGBA1C in the last 72 hours. CBG: No results for input(s): GLUCAP in the last 168 hours. Lipid Profile: No results for input(s): CHOL, HDL, LDLCALC, TRIG, CHOLHDL, LDLDIRECT in the last 72 hours. Thyroid Function Tests: No results for input(s): TSH, T4TOTAL, FREET4, T3FREE, THYROIDAB in the last 72 hours. Anemia  Panel: No results for input(s): VITAMINB12, FOLATE, FERRITIN, TIBC, IRON, RETICCTPCT in the last 72 hours. Sepsis Labs: No results for input(s): PROCALCITON, LATICACIDVEN in the last 168 hours.  Recent Results (from the past 240 hour(s))  SARS Coronavirus 2 by RT PCR (hospital order, performed in [facility] hospital lab) Nasopharyngeal Nasopharyngeal Swab     Status: None   Collection Time: 04/21/20  9:50 AM   Specimen: Nasopharyngeal Swab  Result Value Ref Range Status   SARS Coronavirus 2 NEGATIVE NEGATIVE Final    Comment: (NOTE) SARS-CoV-2 target nucleic acids are NOT DETECTED.  The SARS-CoV-2 RNA is generally detectable in upper and lower respiratory specimens during the acute phase of infection. The lowest concentration of SARS-CoV-2 viral copies this assay can detect is 250 copies / mL. A negative result does not preclude SARS-CoV-2 infection and should not be used as the sole basis for treatment or other patient management decisions.  A negative result may occur with improper specimen collection / handling, submission of specimen other than nasopharyngeal swab, presence of viral mutation(s) within the areas targeted by this assay, and inadequate number of viral copies (<250 copies / mL). A negative result  must be combined with clinical observations, patient history, and epidemiological information.  Fact Sheet for Patients:   BoilerBrush.com.cy  Fact Sheet for Healthcare Providers: https://pope.com/  This test is not yet approved or  cleared by the [place] FDA and has been authorized for detection and/or diagnosis of SARS-CoV-2 by FDA under an Emergency Use Authorization (EUA).  This EUA will remain in effect (meaning this test can be used) for the duration of the COVID-19 declaration under Section 564(b)(1) of the Act, 21 U.S.C. section 360bbb-3(b)(1), unless the authorization is terminated or revoked  sooner.  Performed at [facility], [address] [place], [place] [address]     Radiology Studies: DG Chest 1 View  Result Date: 04/21/2020 CLINICAL DATA:  Anxiety. EXAM: CHEST  1 VIEW COMPARISON:  June 29, 2017 FINDINGS: The heart size and mediastinal contours are within normal limits. Both lungs are clear. The visualized skeletal structures are unremarkable. IMPRESSION: No active disease. Electronically Signed   By: Gerome Sam III M.D   On: 04/21/2020 10:30    Scheduled Meds:  chlordiazePOXIDE  10 mg Oral TID   Followed by   Melene Muller ON 04/23/2020] chlordiazePOXIDE  10 mg Oral BH-qamhs   Followed by   Melene Muller ON 04/25/2020] chlordiazePOXIDE  10 mg Oral Daily   cloNIDine  0.1 mg Oral BID   docusate sodium  100 mg Oral BID   enoxaparin (LOVENOX) injection  55 mg Subcutaneous Q24H   folic acid  1 mg Oral Daily   lisinopril  20 mg Oral Daily   multivitamin with minerals  1 tablet Oral Daily   thiamine  100 mg Oral Daily   Or   thiamine  100 mg Intravenous Daily   Continuous Infusions:   LOS: 1 day    Time spent: 25 mins    Cipriano Bunker, MD [facility]   If 7PM-7AM, please contact night-coverage

## 2020-04-23 LAB — BASIC METABOLIC PANEL
Anion gap: 10 (ref 5–15)
BUN: 9 mg/dL (ref 6–20)
CO2: 24 mmol/L (ref 22–32)
Calcium: 8.4 mg/dL — ABNORMAL LOW (ref 8.9–10.3)
Chloride: 105 mmol/L (ref 98–111)
Creatinine, Ser: 0.6 mg/dL — ABNORMAL LOW (ref 0.61–1.24)
GFR calc Af Amer: >60 mL/min (ref >60)
GFR calc non Af Amer: >60 mL/min (ref >60)
Glucose, Bld: 101 mg/dL — ABNORMAL HIGH (ref 70–99)
Potassium: 3.9 mmol/L (ref 3.5–5.1)
Sodium: 139 mmol/L (ref 135–145)

## 2020-04-23 LAB — CBC
HCT: 44.3 % (ref 39.0–52.0)
Hemoglobin: 13.9 g/dL (ref 13.0–17.0)
MCH: 27.4 pg (ref 26.0–34.0)
MCHC: 31.4 g/dL (ref 30.0–36.0)
MCV: 87.4 fL (ref 80.0–100.0)
Platelets: 225 10*3/uL (ref 150–400)
RBC: 5.07 MIL/uL (ref 4.22–5.81)
RDW: 14 % (ref 11.5–15.5)
WBC: 4.7 10*3/uL (ref 4.0–10.5)
nRBC: 0 % (ref 0.0–0.2)

## 2020-04-23 LAB — MAGNESIUM: Magnesium: 2.2 mg/dL (ref 1.7–2.4)

## 2020-04-23 LAB — PHOSPHORUS: Phosphorus: 3.2 mg/dL (ref 2.5–4.6)

## 2020-04-23 NOTE — Progress Notes (Signed)
PROGRESS NOTE    [name]  JIR:[id number] DOB: May 21, 1971 DOA: 04/21/2020 PCP: Patient, No Pcp Per  Brief Narrative:  [name] is a 49 y.o. male with history of alcohol abuse, HTN (not on meds), anxiety, depression, bipolar disorder, PTSD and morbid obesity presenting with alcohol withdrawal symptoms.  Patient reports binge drinking over the last 3 days until 9 PM last night.  He thinks he might have 10 of the 24 ounce cans of beer yesterday.  He woke up about 5 AM this morning with palpitation, tremor and visual hallucination.  He thinks he might have alcohol-related withdrawal in the past although he describes these as tremor and jerking.  He denies headache, vision change, focal neuro symptoms, fever, chills, URI symptoms, cough, shortness of breath, nausea, vomiting, abdominal pain, diarrhea or UTI symptoms.  He had transient twinging pain about about bilateral subcostal areas while in the waiting room in ED that has resolved.  Patient is unvaccinated against COVID-19.  He denies known COVID-19 exposure. In ED, BP 198/113> 180/130.  HR 108> 100.  Mild temp to 99.4.  95% on RA. Ca 7.9.  Otherwise, CMP and CBC with differential without significant finding.  High-sensitivity troponin negative.  UDS negative.  CXR without acute finding.  Twelve-lead EKG with sinus tachycardia to 101 and PVCs.  EtOH level 15.  Tylenol and salicylate level negative.  Started on Ativan and hospitalist service was called for admission for alcohol withdrawal with elevated CIWA to 20.  He is admitted for alcohol withdrawal started on CIWA protocol.  Assessment & Plan:   Active Problems:   Alcohol withdrawal (HCC)  Alcohol withdrawal syndrome-presented with palpitation, hallucination, tremor and tachycardia.  Last drink about 12 hours prior to presentation.  EtOH level 15.  UDS negative.  Basic labs including CMP and CBC unremarkable except for hypocalcemia. -Detox with Librium taper-we will load with 25 mg  milligrams followed by 10 mg taper -CIWA monitoring with as needed Ativan. -Multivitamins, folic acid and thiamine. -Monitor electrolytes and replenish as appropriate. -Consult TOC for resources.  Hypocalcemia:  Improved. Calcium 7.9.  Albumin is 4.3. -2 g IV calcium gluconate  Uncontrolled hypertension/hypertensive urgency/tachycardia-likely due to alcohol and noncompliance. -Continue home lisinopril at 20 mg daily -Add low-dose clonidine -As needed labetalol  Anxiety/depression/bipolar disorder/PTSD: Somewhat anxious with visual hallucination likely from alcohol.  Not compliant with home medications. -Monitor alcohol withdrawal as above -Consider resuming gabapentin, Lexapro and BuSpar prior to discharge   Class II obesity Body mass index is 38.37 kg/m. -Check hemoglobin A1c, TSH and fasting lipid panel  DVT prophylaxis: Subcu Lovenox     Code Status: Full code Family Communication: No one at bedside. Disposition Plan: Admit to telemetry Consults called: None Admission status: Inpatient  Consultants:    None  Procedures:   Antimicrobials:  Anti-infectives (From admission, onward)   None      Subjective: Patient was seen/ examined at bed side, no overnight events, patient reports feeling very anxious, He wants to leave,  He states his family is alone at home and he wants to be discharged.   Objective: Vitals:   04/22/20 0527 04/22/20 1326 04/22/20 2017 04/23/20 0515  BP: (!) 145/97 (!) 146/97 130/87 (!) 146/86  Pulse: 78 82 77 65  Resp: 16 16 20 20   Temp: 98.3 F (36.8 C) 97.7 F (36.5 C) 98.7 F (37.1 C) 97.7 F (36.5 C)  TempSrc: Oral Oral  Oral  SpO2: 96% 98% 99% 96%  Weight:  Height:       No intake or output data in the 24 hours ending 04/23/20 1350 Filed Weights   04/21/20 0720  Weight: 111.1 kg    Examination:  General exam: Appears calm and comfortable  Respiratory system: Clear to auscultation. Respiratory effort  normal. Cardiovascular system: S1 & S2 heard, RRR. No JVD, murmurs, rubs, gallops or clicks. No pedal edema. Gastrointestinal system: Abdomen is nondistended, soft and nontender. No organomegaly or masses felt. Normal bowel sounds heard. Central nervous system: Alert and oriented. No focal neurological deficits. Extremities: Symmetric 5 x 5 power. Skin: No rashes, lesions or ulcers Psychiatry: Judgement and insight appear normal. Mood & affect appropriate.     Data Reviewed: I have personally reviewed following labs and imaging studies  CBC: Recent Labs  Lab 04/21/20 0950 04/22/20 0315 04/23/20 0732  WBC 7.2 5.3 4.7  NEUTROABS 5.5  --   --   HGB 14.1 13.0 13.9  HCT 43.7 40.8 44.3  MCV 84.9 86.4 87.4  PLT 260 226 225   Basic Metabolic Panel: Recent Labs  Lab 04/21/20 0950 04/22/20 0315 04/23/20 0732  NA 137 140 139  K 4.0 3.7 3.9  CL 102 105 105  CO2 23 28 24   GLUCOSE 97 104* 101*  BUN 6 10 9   CREATININE 0.65 0.73 0.60*  CALCIUM 7.9* 8.4* 8.4*  MG 2.1 2.3 2.2  PHOS  --  3.5 3.2   GFR: Estimated Creatinine Clearance: 134.3 mL/min (A) (by C-G formula based on SCr of 0.6 mg/dL (L)). Liver Function Tests: Recent Labs  Lab 04/21/20 0950 04/22/20 0315  AST 26 17  ALT 19 17  ALKPHOS 99 82  BILITOT 0.5 0.7  PROT 7.7 6.5  ALBUMIN 4.3 3.5   No results for input(s): LIPASE, AMYLASE in the last 168 hours. No results for input(s): AMMONIA in the last 168 hours. Coagulation Profile: No results for input(s): INR, PROTIME in the last 168 hours. Cardiac Enzymes: No results for input(s): CKTOTAL, CKMB, CKMBINDEX, TROPONINI in the last 168 hours. BNP (last 3 results) No results for input(s): PROBNP in the last 8760 hours. HbA1C: No results for input(s): HGBA1C in the last 72 hours. CBG: No results for input(s): GLUCAP in the last 168 hours. Lipid Profile: No results for input(s): CHOL, HDL, LDLCALC, TRIG, CHOLHDL, LDLDIRECT in the last 72 hours. Thyroid Function  Tests: No results for input(s): TSH, T4TOTAL, FREET4, T3FREE, THYROIDAB in the last 72 hours. Anemia Panel: No results for input(s): VITAMINB12, FOLATE, FERRITIN, TIBC, IRON, RETICCTPCT in the last 72 hours. Sepsis Labs: No results for input(s): PROCALCITON, LATICACIDVEN in the last 168 hours.  Recent Results (from the past 240 hour(s))  SARS Coronavirus 2 by RT PCR (hospital order, performed in [facility] hospital lab) Nasopharyngeal Nasopharyngeal Swab     Status: None   Collection Time: 04/21/20  9:50 AM   Specimen: Nasopharyngeal Swab  Result Value Ref Range Status   SARS Coronavirus 2 NEGATIVE NEGATIVE Final    Comment: (NOTE) SARS-CoV-2 target nucleic acids are NOT DETECTED.  The SARS-CoV-2 RNA is generally detectable in upper and lower respiratory specimens during the acute phase of infection. The lowest concentration of SARS-CoV-2 viral copies this assay can detect is 250 copies / mL. A negative result does not preclude SARS-CoV-2 infection and should not be used as the sole basis for treatment or other patient management decisions.  A negative result may occur with improper specimen collection / handling, submission of specimen other than nasopharyngeal swab, presence  of viral mutation(s) within the areas targeted by this assay, and inadequate number of viral copies (<250 copies / mL). A negative result must be combined with clinical observations, patient history, and epidemiological information.  Fact Sheet for Patients:   BoilerBrush.com.cy  Fact Sheet for Healthcare Providers: https://pope.com/  This test is not yet approved or  cleared by the [place] FDA and has been authorized for detection and/or diagnosis of SARS-CoV-2 by FDA under an Emergency Use Authorization (EUA).  This EUA will remain in effect (meaning this test can be used) for the duration of the COVID-19 declaration under Section 564(b)(1) of the  Act, 21 U.S.C. section 360bbb-3(b)(1), unless the authorization is terminated or revoked sooner.  Performed at [facility], [address], [place], [place] [address]     Radiology Studies: No results found.  Scheduled Meds: . chlordiazePOXIDE  10 mg Oral TID   Followed by  . chlordiazePOXIDE  10 mg Oral BH-qamhs   Followed by  . [START ON 04/25/2020] chlordiazePOXIDE  10 mg Oral Daily  . cloNIDine  0.1 mg Oral BID  . docusate sodium  100 mg Oral BID  . enoxaparin (LOVENOX) injection  55 mg Subcutaneous Q24H  . folic acid  1 mg Oral Daily  . lisinopril  20 mg Oral Daily  . multivitamin with minerals  1 tablet Oral Daily  . thiamine  100 mg Oral Daily   Or  . thiamine  100 mg Intravenous Daily   Continuous Infusions:   LOS: 2 days    Time spent: 25 mins    Cipriano Bunker, MD [facility]   If 7PM-7AM, please contact night-coverage

## 2020-04-23 NOTE — Discharge Summary (Signed)
Physician Discharge Summary  [name]:[id number] DOB: April 29, 1971 DOA: 04/21/2020  PCP: Patient, No Pcp Per  Admit date: 04/21/2020 Discharge date: 04/23/2020  Admitted From:  Home.  Disposition: Left against medical advice  Recommendations for Outpatient Follow-up:  1. Follow up with PCP in 1-2 weeks 2. Please obtain BMP/CBC in one week   Home Health: None Equipment/Devices: None  Discharge Condition: Fair CODE STATUS:Full code Diet recommendation: Heart Healthy   Brief Summary / Hospital course: Nicolaus Andel Burtonis a 49 y.o.malewith history ofalcohol abuse, HTN (not on meds), anxiety, depression, bipolar disorder, PTSD and morbid obesity presenting with alcohol withdrawal symptoms. Patient reports binge drinking over the last 3 days until 9 PM last night.He thinks he might have 10 of the24 ounce cans of beer yesterday.He woke up about 5 AM this morning with palpitations, tremor and visual hallucination. He thinks he might have alcohol-related withdrawal in the past although he describes these as tremor and jerking. He denies headache, vision change, focal neuro symptoms, fever, chills, URI symptoms, cough, shortness of breath, nausea, vomiting, abdominal pain, diarrhea orUTI symptoms.He had transient twinging painaboutbilateral subcostal areas while in the waiting room in ED that has resolved. Patient is unvaccinated against COVID-19. He denies known COVID-19 exposure. In ED, BP 198/113>180/130. HR 108>100. Mild temp to 99.4. 95% on RA. Ca7.9.Otherwise, CMP and CBC with differential without significant finding. High-sensitivity troponin negative. UDS negative. CXR without acute finding. Twelve-lead EKG with sinus tachycardia to 101and PVCs. EtOH level 15. Tylenol and salicylate level negative. Started on Ativan and was admitted for alcohol withdrawal with elevated CIWA to 20. He is admitted for alcohol withdrawal started on CIWA protocol.  Patient was  very sleepy the first day in the hospital, continued on Ativan as needed. More alert,  Awake and  Oriented next day but he still has some shaking tremors,  reports having severe anxiety.  Patient wants to leave AGAINST MEDICAL ADVICE.  He states his family is alone and he wants to go , Explained to him in detail about risks and benefits of leaving AGAINST MEDICAL ADVICE.  He states he will follow-up with his primary doctor and he is feeling better.  Patient left AGAINST MEDICAL ADVICE.   Discharge Diagnoses:  Active Problems:   Alcohol withdrawal (HCC)  Alcohol withdrawal syndrome-presented with palpitation, hallucination, tremor and tachycardia.Last drink about 12 hours prior to presentation. EtOH level 15. UDS negative. Basic labs including CMP and CBC unremarkable except for hypocalcemia. -Detox with Librium taper-we will load with 25 mg milligrams followed by 10 mg taper -CIWA monitoring with as needed Ativan. -Multivitamins, folic acid and thiamine. -Monitor electrolytes and replenish as appropriate. -Consult TOC for resources.  Hypocalcemia:  Improved. Calcium 7.9.Albumin is 4.3. -2 g IV calcium gluconate  Uncontrolled hypertension/hypertensive urgency/tachycardia-likely due to alcohol and noncompliance. -Continue home lisinopril at 20 mg daily -Addlow-dose clonidine -As needed labetalol  Anxiety/depression/bipolar disorder/PTSD:Somewhat anxious with visual hallucination likely from alcohol. Not compliant with home medications. -Monitor alcohol withdrawal as above -Consider resuming gabapentin, Lexapro and BuSpar prior to discharge   Class II obesity Body mass index is 38.37 kg/m. -Check hemoglobin A1c, TSH and fasting lipid panel   Discharge Instructions Left AGAINST MEDICAL ADVICE.    No Known Allergies  Consultations:  None   Procedures/Studies: DG Chest 1 View  Result Date: 04/21/2020 CLINICAL DATA:  Anxiety. EXAM: CHEST  1 VIEW COMPARISON:   June 29, 2017 FINDINGS: The heart size and mediastinal contours are within normal limits. Both lungs are clear.  The visualized skeletal structures are unremarkable. IMPRESSION: No active disease. Electronically Signed   By: Gerome Sam III M.D   On: 04/21/2020 10:30        Subjective: Patient was seen and examined at bedside.  He wants to leave AGAINST MEDICAL ADVICE status family is alone.  Explained in detail about risk and benefits, states he will be responsible.  He denies any suicidal or homicidal ideations, he denies any hallucinations, delusions.  Discharge Exam: Vitals:   04/22/20 2017 04/23/20 0515  BP: 130/87 (!) 146/86  Pulse: 77 65  Resp: 20 20  Temp: 98.7 F (37.1 C) 97.7 F (36.5 C)  SpO2: 99% 96%   Vitals:   04/22/20 0527 04/22/20 1326 04/22/20 2017 04/23/20 0515  BP: (!) 145/97 (!) 146/97 130/87 (!) 146/86  Pulse: 78 82 77 65  Resp: 16 16 20 20   Temp: 98.3 F (36.8 C) 97.7 F (36.5 C) 98.7 F (37.1 C) 97.7 F (36.5 C)  TempSrc: Oral Oral  Oral  SpO2: 96% 98% 99% 96%  Weight:      Height:        General: Pt is alert, awake, not in acute distress Cardiovascular: RRR, S1/S2 +, no rubs, no gallops Respiratory: CTA bilaterally, no wheezing, no rhonchi Abdominal: Soft, NT, ND, bowel sounds + Extremities: no edema, no cyanosis    The results of significant diagnostics from this hospitalization (including imaging, microbiology, ancillary and laboratory) are listed below for reference.     Microbiology: Recent Results (from the past 240 hour(s))  SARS Coronavirus 2 by RT PCR (hospital order, performed in [facility] hospital lab) Nasopharyngeal Nasopharyngeal Swab     Status: None   Collection Time: 04/21/20  9:50 AM   Specimen: Nasopharyngeal Swab  Result Value Ref Range Status   SARS Coronavirus 2 NEGATIVE NEGATIVE Final    Comment: (NOTE) SARS-CoV-2 target nucleic acids are NOT DETECTED.  The SARS-CoV-2 RNA is generally detectable in upper  and lower respiratory specimens during the acute phase of infection. The lowest concentration of SARS-CoV-2 viral copies this assay can detect is 250 copies / mL. A negative result does not preclude SARS-CoV-2 infection and should not be used as the sole basis for treatment or other patient management decisions.  A negative result may occur with improper specimen collection / handling, submission of specimen other than nasopharyngeal swab, presence of viral mutation(s) within the areas targeted by this assay, and inadequate number of viral copies (<250 copies / mL). A negative result must be combined with clinical observations, patient history, and epidemiological information.  Fact Sheet for Patients:   04/23/20  Fact Sheet for Healthcare Providers: BoilerBrush.com.cy  This test is not yet approved or  cleared by the https://pope.com/ FDA and has been authorized for detection and/or diagnosis of SARS-CoV-2 by FDA under an Emergency Use Authorization (EUA).  This EUA will remain in effect (meaning this test can be used) for the duration of the COVID-19 declaration under Section 564(b)(1) of the Act, 21 U.S.C. section 360bbb-3(b)(1), unless the authorization is terminated or revoked sooner.  Performed at [facility], [address], [place], [place]      Labs: BNP (last 3 results) No results for input(s): BNP in the last 8760 hours. Basic Metabolic Panel: Recent Labs  Lab 04/21/20 0950 04/22/20 0315 04/23/20 0732  NA 137 140 139  K 4.0 3.7 3.9  CL 102 105 105  CO2 23 28 24   GLUCOSE 97 104* 101*  BUN 6 10 9   CREATININE 0.65 0.73 0.60*  CALCIUM 7.9* 8.4* 8.4*  MG 2.1 2.3 2.2  PHOS  --  3.5 3.2   Liver Function Tests: Recent Labs  Lab 04/21/20 0950 04/22/20 0315  AST 26 17  ALT 19 17  ALKPHOS 99 82  BILITOT 0.5 0.7  PROT 7.7 6.5  ALBUMIN 4.3 3.5   No results for input(s):  LIPASE, AMYLASE in the last 168 hours. No results for input(s): AMMONIA in the last 168 hours. CBC: Recent Labs  Lab 04/21/20 0950 04/22/20 0315 04/23/20 0732  WBC 7.2 5.3 4.7  NEUTROABS 5.5  --   --   HGB 14.1 13.0 13.9  HCT 43.7 40.8 44.3  MCV 84.9 86.4 87.4  PLT 260 226 225   Cardiac Enzymes: No results for input(s): CKTOTAL, CKMB, CKMBINDEX, TROPONINI in the last 168 hours. BNP: Invalid input(s): POCBNP CBG: No results for input(s): GLUCAP in the last 168 hours. D-Dimer No results for input(s): DDIMER in the last 72 hours. Hgb A1c No results for input(s): HGBA1C in the last 72 hours. Lipid Profile No results for input(s): CHOL, HDL, LDLCALC, TRIG, CHOLHDL, LDLDIRECT in the last 72 hours. Thyroid function studies No results for input(s): TSH, T4TOTAL, T3FREE, THYROIDAB in the last 72 hours.  Invalid input(s): FREET3 Anemia work up No results for input(s): VITAMINB12, FOLATE, FERRITIN, TIBC, IRON, RETICCTPCT in the last 72 hours. Urinalysis    Component Value Date/Time   COLORURINE YELLOW 07/16/2019 1740   APPEARANCEUR HAZY (A) 07/16/2019 1740   LABSPEC 1.011 07/16/2019 1740   PHURINE 8.0 07/16/2019 1740   GLUCOSEU NEGATIVE 07/16/2019 1740   HGBUR NEGATIVE 07/16/2019 1740   BILIRUBINUR NEGATIVE 07/16/2019 1740   KETONESUR NEGATIVE 07/16/2019 1740   PROTEINUR NEGATIVE 07/16/2019 1740   UROBILINOGEN 0.2 04/09/2015 0206   NITRITE NEGATIVE 07/16/2019 1740   LEUKOCYTESUR NEGATIVE 07/16/2019 1740   Sepsis Labs Invalid input(s): PROCALCITONIN,  WBC,  LACTICIDVEN Microbiology Recent Results (from the past 240 hour(s))  SARS Coronavirus 2 by RT PCR (hospital order, performed in [facility] hospital lab) Nasopharyngeal Nasopharyngeal Swab     Status: None   Collection Time: 04/21/20  9:50 AM   Specimen: Nasopharyngeal Swab  Result Value Ref Range Status   SARS Coronavirus 2 NEGATIVE NEGATIVE Final    Comment: (NOTE) SARS-CoV-2 target nucleic acids are NOT  DETECTED.  The SARS-CoV-2 RNA is generally detectable in upper and lower respiratory specimens during the acute phase of infection. The lowest concentration of SARS-CoV-2 viral copies this assay can detect is 250 copies / mL. A negative result does not preclude SARS-CoV-2 infection and should not be used as the sole basis for treatment or other patient management decisions.  A negative result may occur with improper specimen collection / handling, submission of specimen other than nasopharyngeal swab, presence of viral mutation(s) within the areas targeted by this assay, and inadequate number of viral copies (<250 copies / mL). A negative result must be combined with clinical observations, patient history, and epidemiological information.  Fact Sheet for Patients:   04/23/20  Fact Sheet for Healthcare Providers: BoilerBrush.com.cy  This test is not yet approved or  cleared by the https://pope.com/ FDA and has been authorized for detection and/or diagnosis of SARS-CoV-2 by FDA under an Emergency Use Authorization (EUA).  This EUA will remain in effect (meaning this test can be used) for the duration of the COVID-19 declaration under Section 564(b)(1) of the Act, 21 U.S.C. section 360bbb-3(b)(1), unless the authorization is  terminated or revoked sooner.  Performed at [facility], [address] [place], [place] [address]      Time coordinating discharge: No charge.  SIGNED:   Cipriano BunkerPARDEEP Tyrece Vanterpool, MD  [facility] 04/23/2020, 1:54 PM Pager   If 7PM-7AM, please contact night-coverage [email or web address]

## 2020-04-23 NOTE — Plan of Care (Signed)
Problem: Education: °Goal: Knowledge of General Education information will improve °Description: Including pain rating scale, medication(s)/side effects and non-pharmacologic comfort measures °Outcome: Not Met (add Reason) °  °Problem: Health Behavior/Discharge Planning: °Goal: Ability to manage health-related needs will improve °Outcome: Not Met (add Reason) °  °Problem: Clinical Measurements: °Goal: Ability to maintain clinical measurements within normal limits will improve °Outcome: Not Met (add Reason) °Goal: Will remain free from infection °Outcome: Not Met (add Reason) °Goal: Diagnostic test results will improve °Outcome: Not Met (add Reason) °Goal: Respiratory complications will improve °Outcome: Not Met (add Reason) °Goal: Cardiovascular complication will be avoided °Outcome: Not Met (add Reason) °  °Problem: Activity: °Goal: Risk for activity intolerance will decrease °Outcome: Not Met (add Reason) °  °Problem: Nutrition: °Goal: Adequate nutrition will be maintained °Outcome: Not Met (add Reason) °  °Problem: Coping: °Goal: Level of anxiety will decrease °Outcome: Not Met (add Reason) °  °Problem: Elimination: °Goal: Will not experience complications related to bowel motility °Outcome: Not Met (add Reason) °Goal: Will not experience complications related to urinary retention °Outcome: Not Met (add Reason) °  °Problem: Pain Managment: °Goal: General experience of comfort will improve °Outcome: Not Met (add Reason) °  °Problem: Safety: °Goal: Ability to remain free from injury will improve °Outcome: Not Met (add Reason) °  °Problem: Skin Integrity: °Goal: Risk for impaired skin integrity will decrease °Outcome: Not Met (add Reason) °  °

## 2020-06-07 ENCOUNTER — Other Ambulatory Visit: Payer: Self-pay

## 2020-06-07 ENCOUNTER — Encounter (HOSPITAL_COMMUNITY): Payer: Self-pay

## 2020-06-07 ENCOUNTER — Emergency Department (HOSPITAL_COMMUNITY)
Admission: EM | Admit: 2020-06-07 | Discharge: 2020-06-07 | Disposition: A | Discharge status: Home / Self Care | Payer: Medicare Other | Attending: Emergency Medicine | Admitting: Emergency Medicine

## 2020-06-07 DIAGNOSIS — Z79899 Other long term (current) drug therapy: Secondary | ICD-10-CM | POA: Diagnosis not present

## 2020-06-07 DIAGNOSIS — I10 Essential (primary) hypertension: Secondary | ICD-10-CM | POA: Diagnosis not present

## 2020-06-07 DIAGNOSIS — F1010 Alcohol abuse, uncomplicated: Secondary | ICD-10-CM | POA: Insufficient documentation

## 2020-06-07 DIAGNOSIS — J45909 Unspecified asthma, uncomplicated: Secondary | ICD-10-CM | POA: Insufficient documentation

## 2020-06-07 MED ORDER — CHLORDIAZEPOXIDE HCL 25 MG PO CAPS
25.0000 mg | ORAL_CAPSULE | Freq: Once | ORAL | Status: AC
  Administered 2020-06-07: 25 mg via ORAL
  Filled 2020-06-07: qty 1

## 2020-06-07 MED ORDER — CHLORDIAZEPOXIDE HCL 25 MG PO CAPS: ORAL_CAPSULE | ORAL | 0 refills | Status: DC

## 2020-06-07 NOTE — ED Triage Notes (Signed)
Pt presents with c/o withdrawal from alcohol. Pt reports he drinks daily, last drink around 4 am. Pt is alert and oriented at this time.

## 2020-06-07 NOTE — ED Provider Notes (Signed)
[facility]-EMERGENCY DEPT Provider Note   CSN: [id number] Arrival date & time: 06/07/20  1428     History Chief Complaint  Patient presents with  . Withdrawal    [name] is a 49 y.o. male.  Patient here for alcohol withdrawal symptoms.  Overall mild.  Having some shakes.  Last drink was about 12 hours ago.  Wants to go to rehab and states that he had success with Librium in the past.  Denies any suicidal homicidal ideation.  Denies any chest pain, shortness of breath.  Overall appears well.  The history is provided by the patient.  Illness Severity:  Mild Onset quality:  Gradual Timing:  Constant Progression:  Unchanged Chronicity:  Recurrent Associated symptoms: no abdominal pain, no chest pain, no cough, no ear pain, no fever, no rash, no shortness of breath, no sore throat and no vomiting        Past Medical History:  Diagnosis Date  . Anxiety   . Asthma   . Atrial fibrillation (HCC)   . Bipolar 1 disorder (HCC)   . Depression   . Heart palpitations    with severe anxiety  . Hypertension   . PTSD (post-traumatic stress disorder)     Patient Active Problem List   Diagnosis Date Noted  . Alcohol withdrawal (HCC) 04/21/2020  . Alcohol abuse with alcohol-induced mood disorder (HCC) 01/18/2017  . Cocaine dependence with cocaine-induced mood disorder (HCC)   . Alcohol use disorder, severe, dependence (HCC) 04/12/2015  . Hypertension 09/08/2011  . Post traumatic stress disorder (PTSD) 09/08/2011    Past Surgical History:  Procedure Laterality Date  . NO PAST SURGERIES         History reviewed. No pertinent family history.  Social History   Tobacco Use  . Smoking status: Never Smoker  . Smokeless tobacco: Never Used  Vaping Use  . Vaping Use: Never used  Substance Use Topics  . Alcohol use: Yes    Comment: 4- 24oz beer daily  . Drug use: No    Comment: Denies 06/27/15    Home Medications Prior to Admission medications    Medication Sig Start Date End Date Taking? Authorizing Provider  B Complex-C (B-COMPLEX WITH VITAMIN C) tablet Take 1 tablet by mouth daily.    [provider]  busPIRone (BUSPAR) 10 MG tablet Take 1 tablet (10 mg total) by mouth 3 (three) times daily. Patient not taking: Reported on 07/16/2019 03/10/17   Raeford Razor, MD  chlordiazePOXIDE (LIBRIUM) 25 MG capsule 50mg  PO TID x 1D, then 25 mg PO BID X 1D, then 25 mg PO QD X 1D 06/07/20   Joniah Bednarski, DO  diphenhydrAMINE (BENADRYL) 25 MG tablet Take 25 mg by mouth every 6 (six) hours as needed for itching or allergies.    [provider]  escitalopram (LEXAPRO) 5 MG tablet Take 1 tablet (5 mg total) by mouth daily. Patient not taking: Reported on 04/21/2020 09/07/19   11/05/19, MD  gabapentin (NEURONTIN) 300 MG capsule Take 1 capsule (300 mg total) by mouth 3 (three) times daily. Patient not taking: Reported on 07/16/2019 01/18/17   01/20/17, NP  hydrOXYzine (ATARAX/VISTARIL) 25 MG tablet Take 1 tablet (25 mg total) by mouth every 8 (eight) hours as needed. Patient not taking: Reported on 04/21/2020 07/16/19   Caccavale, Sophia, PA-C  lisinopril (PRINIVIL,ZESTRIL) 10 MG tablet Take 1 tablet (10 mg total) by mouth daily. Patient not taking: Reported on 07/16/2019 03/10/17   05/11/17,  Jeannett Senior, MD  naproxen (NAPROSYN) 500 MG tablet Take 1 tablet (500 mg total) by mouth 2 (two) times daily. Patient not taking: Reported on 07/16/2019 01/26/17   Horton, Mayer Masker, MD  polycarbophil (FIBERCON) 625 MG tablet Take 625 mg by mouth daily.    [provider]    Allergies    Patient has no known allergies.  Review of Systems   Review of Systems  Constitutional: Negative for chills and fever.  HENT: Negative for ear pain and sore throat.   Eyes: Negative for pain and visual disturbance.  Respiratory: Negative for cough and shortness of breath.   Cardiovascular: Negative for chest pain and palpitations.    Gastrointestinal: Negative for abdominal pain and vomiting.  Genitourinary: Negative for dysuria and hematuria.  Musculoskeletal: Negative for arthralgias and back pain.  Skin: Negative for color change and rash.  Neurological: Negative for seizures and syncope.  Psychiatric/Behavioral: Negative for suicidal ideas. The patient is not nervous/anxious.   All other systems reviewed and are negative.   Physical Exam Updated Vital Signs BP (!) 181/122 (BP Location: Left Arm)   Pulse 99   Temp 98.7 F (37.1 C) (Oral)   Resp 18   SpO2 94%   Physical Exam Vitals and nursing note reviewed.  Constitutional:      Appearance: He is well-developed.  HENT:     Head: Normocephalic and atraumatic.  Eyes:     Conjunctiva/sclera: Conjunctivae normal.  Cardiovascular:     Rate and Rhythm: Normal rate and regular rhythm.     Heart sounds: No murmur heard.   Pulmonary:     Effort: Pulmonary effort is normal. No respiratory distress.     Breath sounds: Normal breath sounds.  Abdominal:     Palpations: Abdomen is soft.     Tenderness: There is no abdominal tenderness.  Musculoskeletal:     Cervical back: Neck supple.  Skin:    General: Skin is warm and dry.  Neurological:     Mental Status: He is alert.  Psychiatric:        Mood and Affect: Mood normal.        Behavior: Behavior normal.        Thought Content: Thought content normal.        Judgment: Judgment normal.     ED Results / Procedures / Treatments   Labs (all labs ordered are listed, but only abnormal results are displayed) Labs Reviewed - No data to display  EKG None  Radiology No results found.  Procedures Procedures (including critical care time)  Medications Ordered in ED Medications  chlordiazePOXIDE (LIBRIUM) capsule 25 mg (has no administration in time range)    ED Course  I have reviewed the triage vital signs and the nursing notes.  Pertinent labs & imaging results that were available during my  care of the patient were reviewed by me and considered in my medical decision making (see chart for details).    MDM Rules/Calculators/A&P                          [name] is a 49 year old male with history of alcohol abuse who presents to the ED with alcohol withdrawal symptoms.  Normal vitals.  No SI, no HI.  Last drink was about 12 hours ago.  Starting to have some tremors.  States he wants to go to alcohol rehab and will contact places.  Has had Librium in the past with success.  Will prescribe Librium and given alcohol rehab resources.  Told to return to the ED if symptoms worsen.  Discharged in good condition.  Overall patient appears well.  This chart was dictated using voice recognition software.  Despite best efforts to proofread,  errors can occur which can change the documentation meaning.    Final Clinical Impression(s) / ED Diagnoses Final diagnoses:  Alcohol abuse    Rx / DC Orders ED Discharge Orders         Ordered    chlordiazePOXIDE (LIBRIUM) 25 MG capsule        06/07/20 1549           Candie Gintz, DO 06/07/20 1554

## 2020-06-07 NOTE — Discharge Instructions (Signed)
Please call around rehab centers to get in for proper alcohol detoxification.  Do not mix Librium with alcohol as this could be deadly.  Please return to the ED if you have any change in symptoms or develop any suicidal homicidal ideation.

## 2020-06-12 ENCOUNTER — Encounter (HOSPITAL_COMMUNITY): Payer: Self-pay | Admitting: Emergency Medicine

## 2020-06-12 ENCOUNTER — Emergency Department (HOSPITAL_COMMUNITY)
Admission: EM | Admit: 2020-06-12 | Discharge: 2020-06-12 | Disposition: A | Discharge status: Home / Self Care | Payer: Medicare Other | Source: Home / Self Care

## 2020-06-12 ENCOUNTER — Other Ambulatory Visit: Payer: Self-pay

## 2020-06-12 DIAGNOSIS — Z5321 Procedure and treatment not carried out due to patient leaving prior to being seen by health care provider: Secondary | ICD-10-CM | POA: Insufficient documentation

## 2020-06-12 DIAGNOSIS — F10129 Alcohol abuse with intoxication, unspecified: Secondary | ICD-10-CM | POA: Insufficient documentation

## 2020-06-12 DIAGNOSIS — F1490 Cocaine use, unspecified, uncomplicated: Secondary | ICD-10-CM | POA: Insufficient documentation

## 2020-06-12 DIAGNOSIS — F1410 Cocaine abuse, uncomplicated: Secondary | ICD-10-CM | POA: Diagnosis not present

## 2020-06-12 DIAGNOSIS — F1024 Alcohol dependence with alcohol-induced mood disorder: Secondary | ICD-10-CM | POA: Diagnosis not present

## 2020-06-12 NOTE — ED Triage Notes (Signed)
Pt arriving via [facility] for alcohol withdrawal but last drink was 2 hours ago. Pt also reports cocaine use tonight as well. Seen for same on 06/07/20.

## 2020-06-14 ENCOUNTER — Inpatient Hospital Stay (HOSPITAL_COMMUNITY)
Admission: EM | Admit: 2020-06-14 | Discharge: 2020-06-17 | DRG: 897 | Disposition: A | Discharge status: Home / Self Care | Payer: Medicare Other | Attending: Family Medicine | Admitting: Family Medicine

## 2020-06-14 ENCOUNTER — Encounter (HOSPITAL_COMMUNITY): Payer: Self-pay | Admitting: Emergency Medicine

## 2020-06-14 ENCOUNTER — Other Ambulatory Visit: Payer: Self-pay

## 2020-06-14 ENCOUNTER — Emergency Department (HOSPITAL_COMMUNITY): Payer: Medicare Other

## 2020-06-14 DIAGNOSIS — Z8249 Family history of ischemic heart disease and other diseases of the circulatory system: Secondary | ICD-10-CM

## 2020-06-14 DIAGNOSIS — R45851 Suicidal ideations: Secondary | ICD-10-CM | POA: Diagnosis present

## 2020-06-14 DIAGNOSIS — T405X1A Poisoning by cocaine, accidental (unintentional), initial encounter: Secondary | ICD-10-CM | POA: Diagnosis present

## 2020-06-14 DIAGNOSIS — E6609 Other obesity due to excess calories: Secondary | ICD-10-CM | POA: Diagnosis not present

## 2020-06-14 DIAGNOSIS — R079 Chest pain, unspecified: Secondary | ICD-10-CM

## 2020-06-14 DIAGNOSIS — Z20822 Contact with and (suspected) exposure to covid-19: Secondary | ICD-10-CM | POA: Diagnosis present

## 2020-06-14 DIAGNOSIS — F10239 Alcohol dependence with withdrawal, unspecified: Secondary | ICD-10-CM | POA: Diagnosis present

## 2020-06-14 DIAGNOSIS — I10 Essential (primary) hypertension: Secondary | ICD-10-CM | POA: Diagnosis present

## 2020-06-14 DIAGNOSIS — Y902 Blood alcohol level of 40-59 mg/100 ml: Secondary | ICD-10-CM | POA: Diagnosis present

## 2020-06-14 DIAGNOSIS — Y92009 Unspecified place in unspecified non-institutional (private) residence as the place of occurrence of the external cause: Secondary | ICD-10-CM

## 2020-06-14 DIAGNOSIS — R0789 Other chest pain: Secondary | ICD-10-CM | POA: Diagnosis present

## 2020-06-14 DIAGNOSIS — Z6839 Body mass index (BMI) 39.0-39.9, adult: Secondary | ICD-10-CM

## 2020-06-14 DIAGNOSIS — F10230 Alcohol dependence with withdrawal, uncomplicated: Principal | ICD-10-CM | POA: Diagnosis present

## 2020-06-14 DIAGNOSIS — Z811 Family history of alcohol abuse and dependence: Secondary | ICD-10-CM

## 2020-06-14 DIAGNOSIS — F10939 Alcohol use, unspecified with withdrawal, unspecified: Secondary | ICD-10-CM | POA: Diagnosis present

## 2020-06-14 DIAGNOSIS — F1410 Cocaine abuse, uncomplicated: Secondary | ICD-10-CM

## 2020-06-14 DIAGNOSIS — F1014 Alcohol abuse with alcohol-induced mood disorder: Secondary | ICD-10-CM | POA: Diagnosis present

## 2020-06-14 DIAGNOSIS — E876 Hypokalemia: Secondary | ICD-10-CM | POA: Diagnosis present

## 2020-06-14 DIAGNOSIS — F10231 Alcohol dependence with withdrawal delirium: Secondary | ICD-10-CM | POA: Diagnosis not present

## 2020-06-14 DIAGNOSIS — F4310 Post-traumatic stress disorder, unspecified: Secondary | ICD-10-CM | POA: Diagnosis present

## 2020-06-14 DIAGNOSIS — I4891 Unspecified atrial fibrillation: Secondary | ICD-10-CM | POA: Diagnosis present

## 2020-06-14 DIAGNOSIS — F1590 Other stimulant use, unspecified, uncomplicated: Secondary | ICD-10-CM | POA: Diagnosis present

## 2020-06-14 DIAGNOSIS — F1024 Alcohol dependence with alcohol-induced mood disorder: Principal | ICD-10-CM | POA: Diagnosis present

## 2020-06-14 DIAGNOSIS — F319 Bipolar disorder, unspecified: Secondary | ICD-10-CM | POA: Diagnosis present

## 2020-06-14 DIAGNOSIS — Z79899 Other long term (current) drug therapy: Secondary | ICD-10-CM

## 2020-06-14 DIAGNOSIS — F10930 Alcohol use, unspecified with withdrawal, uncomplicated: Secondary | ICD-10-CM

## 2020-06-14 DIAGNOSIS — J45909 Unspecified asthma, uncomplicated: Secondary | ICD-10-CM | POA: Diagnosis present

## 2020-06-14 LAB — HEPATIC FUNCTION PANEL
ALT: 18 U/L (ref 0–44)
AST: 22 U/L (ref 15–41)
Albumin: 3.8 g/dL (ref 3.5–5.0)
Alkaline Phosphatase: 89 U/L (ref 38–126)
Bilirubin, Direct: <0.1 mg/dL (ref 0.0–0.2)
Total Bilirubin: 0.8 mg/dL (ref 0.3–1.2)
Total Protein: 7.5 g/dL (ref 6.5–8.1)

## 2020-06-14 LAB — BASIC METABOLIC PANEL
Anion gap: 14 (ref 5–15)
BUN: 7 mg/dL (ref 6–20)
CO2: 21 mmol/L — ABNORMAL LOW (ref 22–32)
Calcium: 8.9 mg/dL (ref 8.9–10.3)
Chloride: 103 mmol/L (ref 98–111)
Creatinine, Ser: 0.88 mg/dL (ref 0.61–1.24)
GFR, Estimated: >60 mL/min (ref >60)
Glucose, Bld: 95 mg/dL (ref 70–99)
Potassium: 3.5 mmol/L (ref 3.5–5.1)
Sodium: 138 mmol/L (ref 135–145)

## 2020-06-14 LAB — RAPID URINE DRUG SCREEN, HOSP PERFORMED
Amphetamines: NOT DETECTED
Barbiturates: NOT DETECTED
Benzodiazepines: POSITIVE — AB
Cocaine: POSITIVE — AB
Opiates: NOT DETECTED
Tetrahydrocannabinol: NOT DETECTED

## 2020-06-14 LAB — CBC
HCT: 45.7 % (ref 39.0–52.0)
Hemoglobin: 14.4 g/dL (ref 13.0–17.0)
MCH: 26.8 pg (ref 26.0–34.0)
MCHC: 31.5 g/dL (ref 30.0–36.0)
MCV: 85.1 fL (ref 80.0–100.0)
Platelets: 286 10*3/uL (ref 150–400)
RBC: 5.37 MIL/uL (ref 4.22–5.81)
RDW: 13.3 % (ref 11.5–15.5)
WBC: 6.2 10*3/uL (ref 4.0–10.5)
nRBC: 0 % (ref 0.0–0.2)

## 2020-06-14 LAB — RESPIRATORY PANEL BY RT PCR (FLU A&B, COVID)
Influenza A by PCR: NEGATIVE
Influenza B by PCR: NEGATIVE
SARS Coronavirus 2 by RT PCR: NEGATIVE

## 2020-06-14 LAB — ETHANOL: Alcohol, Ethyl (B): 43 mg/dL — ABNORMAL HIGH (ref <10)

## 2020-06-14 LAB — TROPONIN I (HIGH SENSITIVITY)
Troponin I (High Sensitivity): 11 ng/L (ref <18)
Troponin I (High Sensitivity): 15 ng/L (ref <18)

## 2020-06-14 MED ORDER — BISACODYL 5 MG PO TBEC: 5.0000 mg | DELAYED_RELEASE_TABLET | Freq: Every day | ORAL | Status: DC | PRN

## 2020-06-14 MED ORDER — LORAZEPAM 1 MG PO TABS: 0.0000 mg | ORAL_TABLET | Freq: Four times a day (QID) | ORAL | Status: DC

## 2020-06-14 MED ORDER — LORAZEPAM 2 MG/ML IJ SOLN
1.0000 mg | Freq: Once | INTRAMUSCULAR | Status: AC
  Administered 2020-06-14: 1 mg via INTRAVENOUS
  Filled 2020-06-14: qty 1

## 2020-06-14 MED ORDER — LORAZEPAM 1 MG PO TABS: 0.0000 mg | ORAL_TABLET | Freq: Two times a day (BID) | ORAL | Status: DC

## 2020-06-14 MED ORDER — CHLORTHALIDONE 25 MG PO TABS
25.0000 mg | ORAL_TABLET | Freq: Every day | ORAL | Status: DC
  Administered 2020-06-14 – 2020-06-17 (×4): 25 mg via ORAL
  Filled 2020-06-14 (×4): qty 1

## 2020-06-14 MED ORDER — ONDANSETRON HCL 4 MG/2ML IJ SOLN: 4.0000 mg | Freq: Four times a day (QID) | INTRAMUSCULAR | Status: DC | PRN

## 2020-06-14 MED ORDER — THIAMINE HCL 100 MG PO TABS
100.0000 mg | ORAL_TABLET | Freq: Every day | ORAL | Status: DC
  Administered 2020-06-15 – 2020-06-17 (×3): 100 mg via ORAL
  Filled 2020-06-14 (×3): qty 1

## 2020-06-14 MED ORDER — DOCUSATE SODIUM 100 MG PO CAPS
100.0000 mg | ORAL_CAPSULE | Freq: Two times a day (BID) | ORAL | Status: DC
  Administered 2020-06-14 – 2020-06-17 (×6): 100 mg via ORAL
  Filled 2020-06-14 (×6): qty 1

## 2020-06-14 MED ORDER — LORAZEPAM 2 MG/ML IJ SOLN
1.0000 mg | INTRAMUSCULAR | Status: DC | PRN
  Administered 2020-06-15 – 2020-06-16 (×3): 1 mg via INTRAVENOUS
  Filled 2020-06-14 (×3): qty 1

## 2020-06-14 MED ORDER — LORAZEPAM 1 MG PO TABS
1.0000 mg | ORAL_TABLET | ORAL | Status: DC | PRN
  Administered 2020-06-17: 1 mg via ORAL

## 2020-06-14 MED ORDER — FOLIC ACID 1 MG PO TABS
1.0000 mg | ORAL_TABLET | Freq: Every day | ORAL | Status: DC
  Administered 2020-06-14 – 2020-06-17 (×4): 1 mg via ORAL
  Filled 2020-06-14 (×4): qty 1

## 2020-06-14 MED ORDER — LACTATED RINGERS IV SOLN: INTRAVENOUS | Status: DC

## 2020-06-14 MED ORDER — THIAMINE HCL 100 MG PO TABS: 100.0000 mg | ORAL_TABLET | Freq: Every day | ORAL | Status: DC

## 2020-06-14 MED ORDER — THIAMINE HCL 100 MG/ML IJ SOLN
100.0000 mg | Freq: Every day | INTRAMUSCULAR | Status: DC
  Administered 2020-06-14: 100 mg via INTRAVENOUS
  Filled 2020-06-14 (×2): qty 2

## 2020-06-14 MED ORDER — ADULT MULTIVITAMIN W/MINERALS CH
1.0000 | ORAL_TABLET | Freq: Every day | ORAL | Status: DC
  Administered 2020-06-14 – 2020-06-17 (×4): 1 via ORAL
  Filled 2020-06-14 (×4): qty 1

## 2020-06-14 MED ORDER — LORAZEPAM 2 MG/ML IJ SOLN
0.0000 mg | Freq: Four times a day (QID) | INTRAMUSCULAR | Status: DC
  Administered 2020-06-14 (×2): 2 mg via INTRAVENOUS
  Filled 2020-06-14 (×2): qty 1

## 2020-06-14 MED ORDER — DIAZEPAM 5 MG PO TABS
5.0000 mg | ORAL_TABLET | Freq: Four times a day (QID) | ORAL | Status: DC
  Administered 2020-06-14 – 2020-06-15 (×5): 5 mg via ORAL
  Filled 2020-06-14 (×5): qty 1

## 2020-06-14 MED ORDER — ONDANSETRON HCL 4 MG PO TABS: 4.0000 mg | ORAL_TABLET | Freq: Four times a day (QID) | ORAL | Status: DC | PRN

## 2020-06-14 MED ORDER — ENOXAPARIN SODIUM 40 MG/0.4ML ~~LOC~~ SOLN
40.0000 mg | SUBCUTANEOUS | Status: DC
  Administered 2020-06-14 – 2020-06-17 (×2): 40 mg via SUBCUTANEOUS
  Filled 2020-06-14 (×3): qty 0.4

## 2020-06-14 MED ORDER — LACTATED RINGERS IV BOLUS
1000.0000 mL | Freq: Once | INTRAVENOUS | Status: AC
  Administered 2020-06-14: 1000 mL via INTRAVENOUS

## 2020-06-14 MED ORDER — SODIUM CHLORIDE 0.9% FLUSH
3.0000 mL | Freq: Two times a day (BID) | INTRAVENOUS | Status: DC
  Administered 2020-06-15 – 2020-06-17 (×4): 3 mL via INTRAVENOUS

## 2020-06-14 MED ORDER — ACETAMINOPHEN 325 MG PO TABS: 650.0000 mg | ORAL_TABLET | Freq: Four times a day (QID) | ORAL | Status: DC | PRN

## 2020-06-14 MED ORDER — POLYETHYLENE GLYCOL 3350 17 G PO PACK: 17.0000 g | PACK | Freq: Every day | ORAL | Status: DC | PRN

## 2020-06-14 MED ORDER — LORAZEPAM 2 MG/ML IJ SOLN: 0.0000 mg | Freq: Two times a day (BID) | INTRAMUSCULAR | Status: DC

## 2020-06-14 MED ORDER — ACETAMINOPHEN 650 MG RE SUPP: 650.0000 mg | Freq: Four times a day (QID) | RECTAL | Status: DC | PRN

## 2020-06-14 MED ORDER — THIAMINE HCL 100 MG/ML IJ SOLN
100.0000 mg | Freq: Every day | INTRAMUSCULAR | Status: DC
  Filled 2020-06-14: qty 2

## 2020-06-14 MED ORDER — LORAZEPAM 2 MG/ML IJ SOLN
0.0000 mg | Freq: Four times a day (QID) | INTRAMUSCULAR | Status: DC
  Administered 2020-06-14: 2 mg via INTRAVENOUS
  Administered 2020-06-14: 1 mg via INTRAVENOUS
  Filled 2020-06-14 (×2): qty 1

## 2020-06-14 MED ORDER — HYDRALAZINE HCL 20 MG/ML IJ SOLN: 5.0000 mg | INTRAMUSCULAR | Status: DC | PRN

## 2020-06-14 NOTE — H&P (Addendum)
History and Physical    [name]:[id number] DOB: 1971-04-16 DOA: 06/14/2020  PCP: Patient, No Pcp Per Consultants:  None Patient coming from:  Home - lives with father; NOK: [name], [phone]  Chief Complaint: CP/ETOH  HPI: [name] is a 49 y.o. male with medical history significant of HTN; bipolar; ETOH dependence; and afib presenting for ETOH detox, with CP.  He reports that between Monday and Wednesday he had too much to drink - 4-6 24-ounce cans per day.  He tried to start tapering down on Wednesday.  He has not eaten for about 4 days.  He decided to get cocaine to give himself some energy - this isn't usual for him.  His heart was racing really fast.  And now the withdrawal is kicking in - fatigue, visual hallucinations.  Usually 1 drink per day (24 ounces).  He can't remember the last time he didn't drink at all.  He has had problems with DTs in the past.  ?withdrawal seizure last Thursday.  He has been to rehab in the past.  He does desire to quit now.  He sweated a lot yesterday.  His diet has been very poor.    ED Course:  Carryover, per Dr. Julian Reil:  49 yo M who does EtOH and cocaine. EDP wants to admit due to alcohol withdrawal. Having tremors despite ativan. CP is less impressive and trops are neg anyhow.    Review of Systems: As per HPI; otherwise review of systems reviewed and negative.   Ambulatory Status:  Ambulates without assistance or with a cane  COVID Vaccine Status:   Complete  Past Medical History:  Diagnosis Date   Anxiety    Asthma    Atrial fibrillation (HCC)    not on AC   Bipolar 1 disorder (HCC)    Depression    Heart palpitations    with severe anxiety   Hypertension    PTSD (post-traumatic stress disorder)     Past Surgical History:  Procedure Laterality Date   NO PAST SURGERIES      Social History   Socioeconomic History   Marital status: Single    Spouse name: Not on file   Number of  children: Not on file   Years of education: Not on file   Highest education level: Not on file  Occupational History   Occupation: disabled  Tobacco Use   Smoking status: Never Smoker   Smokeless tobacco: Never Used  Vaping Use   Vaping Use: Never used  Substance and Sexual Activity   Alcohol use: Yes    Comment: 4- 24oz beer daily   Drug use: Yes    Types: Cocaine   Sexual activity: Never  Other Topics Concern   Not on file  Social History Narrative   ** Merged History Encounter **       Social Determinants of Health   Financial Resource Strain:    Difficulty of Paying Living Expenses: Not on file  Food Insecurity:    Worried About Programme researcher, broadcasting/film/video in the Last Year: Not on file   [employer] of Food in the Last Year: Not on file  Transportation Needs:    Lack of Transportation (Medical): Not on file   Lack of Transportation (Non-Medical): Not on file  Physical Activity:    Days of Exercise per Week: Not on file   Minutes of Exercise per Session: Not on file  Stress:    Feeling of Stress :  Not on file  Social Connections:    Frequency of Communication with Friends and Family: Not on file   Frequency of Social Gatherings with Friends and Family: Not on file   Attends Religious Services: Not on file   Active Member of Clubs or Organizations: Not on file   Attends Banker Meetings: Not on file   Marital Status: Not on file  Intimate Partner Violence:    Fear of Current or Ex-Partner: Not on file   Emotionally Abused: Not on file   Physically Abused: Not on file   Sexually Abused: Not on file    No Known Allergies  Family History  Problem Relation Age of Onset   Alcohol abuse Mother    Hypertension Father    Dementia Father    Alcohol abuse Maternal Grandfather     Prior to Admission medications   Medication Sig Start Date End Date Taking? Authorizing Provider  B Complex-C (B-COMPLEX WITH VITAMIN C) tablet Take 1  tablet by mouth daily.    [provider]  busPIRone (BUSPAR) 10 MG tablet Take 1 tablet (10 mg total) by mouth 3 (three) times daily. Patient not taking: Reported on 07/16/2019 03/10/17   Raeford Razor, MD  chlordiazePOXIDE (LIBRIUM) 25 MG capsule 50mg  PO TID x 1D, then 25 mg PO BID X 1D, then 25 mg PO QD X 1D 06/07/20   Curatolo, Adam, DO  diphenhydrAMINE (BENADRYL) 25 MG tablet Take 25 mg by mouth every 6 (six) hours as needed for itching or allergies.    [provider]  escitalopram (LEXAPRO) 5 MG tablet Take 1 tablet (5 mg total) by mouth daily. Patient not taking: Reported on 04/21/2020 09/07/19   11/05/19, MD  gabapentin (NEURONTIN) 300 MG capsule Take 1 capsule (300 mg total) by mouth 3 (three) times daily. Patient not taking: Reported on 07/16/2019 01/18/17   01/20/17, NP  hydrOXYzine (ATARAX/VISTARIL) 25 MG tablet Take 1 tablet (25 mg total) by mouth every 8 (eight) hours as needed. Patient not taking: Reported on 04/21/2020 07/16/19   Caccavale, Sophia, PA-C  lisinopril (PRINIVIL,ZESTRIL) 10 MG tablet Take 1 tablet (10 mg total) by mouth daily. Patient not taking: Reported on 07/16/2019 03/10/17   05/11/17, MD  naproxen (NAPROSYN) 500 MG tablet Take 1 tablet (500 mg total) by mouth 2 (two) times daily. Patient not taking: Reported on 07/16/2019 01/26/17   Horton, 01/28/17, MD  polycarbophil (FIBERCON) 625 MG tablet Take 625 mg by mouth daily.    [provider]    Physical Exam: Vitals:   06/14/20 0656 06/14/20 0715 06/14/20 0800 06/14/20 0900  BP:  (!) 157/114 (!) 150/118 137/65  Pulse: 98 97 98 95  Resp:   (!) 22 (!) 27  Temp:      TempSrc:      SpO2: 95% 92% 94% 94%  Weight:      Height:          General:  Appears calm and comfortable and is NAD  Eyes: EOMI, normal lids, iris  ENT:  grossly normal hearing, lips & tongue, mmm; appropriate dentition  Neck:  no LAD, masses or thyromegaly  Cardiovascular:  RRR, no m/r/g. No  LE edema.   Respiratory:   CTA bilaterally with no wheezes/rales/rhonchi.  Normal respiratory effort.  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 Both lungs are clear. The visualized skeletal structures are unremarkable. IMPRESSION: No active cardiopulmonary disease. Electronically Signed   By: Helyn NumbersAshesh  Parikh MD   On: 06/14/2020 00:53    EKG: Independently reviewed.  Sinus tachycardia with rate 122; nonspecific ST changes with no evidence of acute ischemia   Labs on Admission: I have personally reviewed the available labs and imaging studies at the time of the admission.  Pertinent labs:   Unremarkable CMP HS troponin 11, 15 Normal CBC COVID/flu negative   Assessment/Plan Principal Problem:   Alcohol dependence with withdrawal (HCC) Active Problems:   Hypertension   Cocaine abuse (HCC)   Class 2 obesity due to excess calories with body mass index (BMI) of 39.0 to 39.9 in adult   ETOH dependence with withdrawal -Patient with chronic ETOH dependence -Has failed rehab multiple times -Currently has been drinking nonstop since 11/13 -ETOH level 43 and he is experiencing visual hallucinations and apparent w/d -He is at high risk for complications of withdrawal including seizures,  DTs -Will observe -CIWA protocol -TOC team consult for possible inpatient treatment -Elevated LFTs are likely related to alcoholism -Will give standing Valium 5 mg PO q6h as well as standing Ativan on sliding scale and PRN Ativan in an attempt to avoid escalation of care  HTN -Previously on medications for this issue but no longer sees a PCP or takes medications -Will start Chlorthalidone 25 mg PO daily -Recheck BMP in AM  Mental illness -Reported h/o PTSD, bipolar, remote schizophrenia (patient reports that it resolved), remote self-mutilation -Suggest outpatient psychiatric evaluation once he is remote from ETOH  Cocaine abuse -Patient reports infrequent use of cocaine with associated palpitations with use -HS troponin negative x 2 -Cessation encouraged -UDS pending  Obesity Body mass index is 39.54 kg/m. -Weight loss should be encouraged -Outpatient PCP/bariatric medicine/bariatric surgery f/u encouraged    Note: This patient has been tested and is negative for the novel coronavirus COVID-19. The patient has been fully vaccinated against COVID-19.    DVT prophylaxis:  Lovenox Code Status:  Full - confirmed with patient Family Communication: None present Disposition Plan:  The patient is from: home  Anticipated d[facility] is [facility]   Anticipated d/c date will depend on clinical response to treatment, but possibly as early as tomorrow if she has excellent response to treatment  Patient is currently: acutely ill Consults called: TOC team Admission status:  It is my clinical opinion that referral for OBSERVATION is reasonable and necessary in this patient based on the above information provided. The aforementioned taken together are felt to place the patient at high risk for further clinical deterioration. However it is anticipated that the patient may be medically stable for discharge from the hospital within 24 to 48 hours.    Jonah BlueJennifer Caydence Enck MD [facility]   How to contact the [facility] Attending or Consulting provider 7A - 7P or covering provider during after hours 7P -7A, for  this patient?  1. Check the care team in [employer] and look for a) attending/consulting [facility] provider listed and b) the [facility] team listed 2. Log into [email or web address] and use [facility]'s universal password to access. If you do not have the password, please contact the hospital operator. 3. Locate the [facility] provider you are looking for under [facility] and page to a number that you can be directly reached. 4. If you still have difficulty reaching the provider, please page the [facility] (Director on Call) for the Hospitalists listed on amion for assistance.   06/14/2020, 9:17 AM

## 2020-06-14 NOTE — ED Notes (Signed)
Pt requesting alcohol detoxing at this time, provider to be made aware

## 2020-06-14 NOTE — ED Provider Notes (Signed)
[facility] EMERGENCY DEPARTMENT Provider Note   CSN: [id number] Arrival date & time: 06/14/20  0020   History Chief Complaint  Patient presents with   Chest Pain    [name] is a 49 y.o. male.  The history is provided by the patient.  Chest Pain He has history of hypertension, bipolar disorder, atrial fibrillation but not on anticoagulation, alcohol abuse and states that he has been drinking heavily for the past week.  He has been drinking about four 24 ounce beers a day.  Tonight, he thought he would try to stop so he used some cocaine to help ease off from the alcohol.  Last drink was about 7 PM.  After the cocaine, he noted that his heart was racing and he had a sharp pain in the right anterior chest.  He rates pain at 7/10.  He has had similar symptoms when he has used cocaine in the past.  Of note, he does have history of delirium tremens and he is unsure if he has had a history of alcohol withdrawal seizures.  He has been vaccinated against COVID-19.  Past Medical History:  Diagnosis Date   Anxiety    Asthma    Atrial fibrillation (HCC)    Bipolar 1 disorder (HCC)    Depression    Heart palpitations    with severe anxiety   Hypertension    PTSD (post-traumatic stress disorder)     Patient Active Problem List   Diagnosis Date Noted   Alcohol withdrawal (HCC) 04/21/2020   Alcohol abuse with alcohol-induced mood disorder (HCC) 01/18/2017   Cocaine dependence with cocaine-induced mood disorder (HCC)    Alcohol use disorder, severe, dependence (HCC) 04/12/2015   Hypertension 09/08/2011   Post traumatic stress disorder (PTSD) 09/08/2011    Past Surgical History:  Procedure Laterality Date   NO PAST SURGERIES         History reviewed. No pertinent family history.  Social History   Tobacco Use   Smoking status: Never Smoker   Smokeless tobacco: Never Used  Vaping Use   Vaping Use: Never used  Substance Use Topics    Alcohol use: Yes    Comment: 4- 24oz beer daily   Drug use: No    Comment: Denies 06/27/15    Home Medications Prior to Admission medications   Medication Sig Start Date End Date Taking? Authorizing Provider  B Complex-C (B-COMPLEX WITH VITAMIN C) tablet Take 1 tablet by mouth daily.    [provider]  busPIRone (BUSPAR) 10 MG tablet Take 1 tablet (10 mg total) by mouth 3 (three) times daily. Patient not taking: Reported on 07/16/2019 03/10/17   Raeford Razor, MD  chlordiazePOXIDE (LIBRIUM) 25 MG capsule 50mg  PO TID x 1D, then 25 mg PO BID X 1D, then 25 mg PO QD X 1D 06/07/20   Curatolo, Adam, DO  diphenhydrAMINE (BENADRYL) 25 MG tablet Take 25 mg by mouth every 6 (six) hours as needed for itching or allergies.    [provider]  escitalopram (LEXAPRO) 5 MG tablet Take 1 tablet (5 mg total) by mouth daily. Patient not taking: Reported on 04/21/2020 09/07/19   11/05/19, MD  gabapentin (NEURONTIN) 300 MG capsule Take 1 capsule (300 mg total) by mouth 3 (three) times daily. Patient not taking: Reported on 07/16/2019 01/18/17   01/20/17, NP  hydrOXYzine (ATARAX/VISTARIL) 25 MG tablet Take 1 tablet (25 mg total) by mouth every 8 (eight) hours as needed. Patient not  taking: Reported on 04/21/2020 07/16/19   Caccavale, Sophia, PA-C  lisinopril (PRINIVIL,ZESTRIL) 10 MG tablet Take 1 tablet (10 mg total) by mouth daily. Patient not taking: Reported on 07/16/2019 03/10/17   Raeford Razor, MD  naproxen (NAPROSYN) 500 MG tablet Take 1 tablet (500 mg total) by mouth 2 (two) times daily. Patient not taking: Reported on 07/16/2019 01/26/17   Horton, Mayer Masker, MD  polycarbophil (FIBERCON) 625 MG tablet Take 625 mg by mouth daily.    [provider]    Allergies    Patient has no known allergies.  Review of Systems   Review of Systems  Cardiovascular: Positive for chest pain.  All other systems reviewed and are negative.   Physical Exam Updated Vital  Signs Pulse (!) 122    Temp 98.8 F (37.1 C) (Oral)    Resp (!) 23    Ht 5\' 6"  (1.676 m)    Wt 111.1 kg    SpO2 93%    BMI 39.54 kg/m   Physical Exam Vitals and nursing note reviewed.   Morbidly obese 49 year old male, resting comfortably and in no acute distress. Vital signs are significant for elevated heart rate, respiratory rate and blood pressure. Oxygen saturation is 93%, which is normal. Head is normocephalic and atraumatic. PERRLA, EOMI. Oropharynx is clear. Neck is nontender and supple without adenopathy or JVD. Back is nontender and there is no CVA tenderness. Lungs are clear without rales, wheezes, or rhonchi. Chest is nontender. Heart is tachycardic without murmur. Abdomen is soft, flat, nontender without masses or hepatosplenomegaly and peristalsis is normoactive. Extremities have no cyanosis or edema, full range of motion is present. Skin is warm and dry without rash. Neurologic: Awake and alert, cranial nerves are intact, moderate tremulousness is noted but moves all extremities equally.  ED Results / Procedures / Treatments   Labs (all labs ordered are listed, but only abnormal results are displayed) Labs Reviewed  BASIC METABOLIC PANEL  CBC  TROPONIN I (HIGH SENSITIVITY)    EKG EKG Interpretation  Date/Time:  Thursday June 14 2020 00:27:04 EDT Ventricular Rate:  122 PR Interval:    QRS Duration: 88 QT Interval:  324 QTC Calculation: 462 R Axis:   -35 Text Interpretation: Sinus tachycardia LAE, consider biatrial enlargement Left axis deviation Anterior infarct, old Baseline wander in lead(s) I II aVR aVL aVF V3 When compared with ECG of 04/21/2020, HEART RATE has increased Premature ventricular complexes are no longer present Confirmed by 04/23/2020 (Dione Booze) on 06/14/2020 12:29:22 AM   Radiology DG Chest 2 View  Result Date: 06/14/2020 CLINICAL DATA:  Chest pain EXAM: CHEST - 2 VIEW COMPARISON:  04/21/2020 FINDINGS: The heart size and mediastinal  contours are within normal limits. Both lungs are clear. The visualized skeletal structures are unremarkable. IMPRESSION: No active cardiopulmonary disease. Electronically Signed   By: 04/23/2020 MD   On: 06/14/2020 00:53    Procedures Procedures  CRITICAL CARE Performed by: 06/16/2020 Total critical care time: 50 minutes Critical care time was exclusive of separately billable procedures and treating other patients. Critical care was necessary to treat or prevent imminent or life-threatening deterioration. Critical care was time spent personally by me on the following activities: development of treatment plan with patient and/or surrogate as well as nursing, discussions with consultants, evaluation of patient's response to treatment, examination of patient, obtaining history from patient or surrogate, ordering and performing treatments and interventions, ordering and review of laboratory studies, ordering and review of radiographic studies,  pulse oximetry and re-evaluation of patient's condition.  Medications Ordered in ED Medications - No data to display  ED Course  I have reviewed the triage vital signs and the nursing notes.  Pertinent labs & imaging results that were available during my care of the patient were reviewed by me and considered in my medical decision making (see chart for details).  MDM Rules/Calculators/A&P Atypical chest pain in the setting of cocaine use.  Alcohol withdrawal.  Old records are reviewed and he does have a recent hospitalization for alcohol withdrawal.  He is started on CIWA protocol.  ECG shows no acute changes.  Chest x-ray shows no acute changes.  Will need to trend troponins.  He has required significant amount of lorazepam, and continues to be tremulous.  Blood pressure is staying mildly elevated.  Troponins are normal.  No evidence of ACS.  However, he continues to need sedation for his alcohol withdrawal.  Case is discussed with Dr. Julian Reil of  [facility], who agrees to admit the patient.  Final Clinical Impression(s) / ED Diagnoses Final diagnoses:  Alcohol withdrawal syndrome without complication (HCC)  Cocaine abuse (HCC)  Nonspecific chest pain    Rx / DC Orders ED Discharge Orders    None       Dione Booze, MD 06/14/20 [phone]

## 2020-06-14 NOTE — Plan of Care (Signed)
New admit from ED-Ciwa protocol ongoing

## 2020-06-14 NOTE — ED Notes (Signed)
Pt notified/aware urine needed@1045 

## 2020-06-14 NOTE — ED Triage Notes (Signed)
Pt presents to ED BIB [facility]. Pt c/o R CP that radiated to R shoulder. Pt states that he drank 4 - 16oz beers and did cocaine and had the subsequent CP. EMS given nitro x2 and 324 ASA. Pt reports relief in pain. Hx HTN and tachycardia

## 2020-06-15 DIAGNOSIS — Z20822 Contact with and (suspected) exposure to covid-19: Secondary | ICD-10-CM | POA: Diagnosis present

## 2020-06-15 DIAGNOSIS — E6609 Other obesity due to excess calories: Secondary | ICD-10-CM | POA: Diagnosis present

## 2020-06-15 DIAGNOSIS — F10239 Alcohol dependence with withdrawal, unspecified: Secondary | ICD-10-CM | POA: Diagnosis present

## 2020-06-15 DIAGNOSIS — F10230 Alcohol dependence with withdrawal, uncomplicated: Secondary | ICD-10-CM | POA: Diagnosis present

## 2020-06-15 DIAGNOSIS — I4891 Unspecified atrial fibrillation: Secondary | ICD-10-CM | POA: Diagnosis present

## 2020-06-15 DIAGNOSIS — F1014 Alcohol abuse with alcohol-induced mood disorder: Secondary | ICD-10-CM | POA: Diagnosis not present

## 2020-06-15 DIAGNOSIS — F4310 Post-traumatic stress disorder, unspecified: Secondary | ICD-10-CM | POA: Diagnosis present

## 2020-06-15 DIAGNOSIS — Y902 Blood alcohol level of 40-59 mg/100 ml: Secondary | ICD-10-CM | POA: Diagnosis present

## 2020-06-15 DIAGNOSIS — F1410 Cocaine abuse, uncomplicated: Secondary | ICD-10-CM | POA: Diagnosis present

## 2020-06-15 DIAGNOSIS — Z6839 Body mass index (BMI) 39.0-39.9, adult: Secondary | ICD-10-CM | POA: Diagnosis not present

## 2020-06-15 DIAGNOSIS — Z811 Family history of alcohol abuse and dependence: Secondary | ICD-10-CM | POA: Diagnosis not present

## 2020-06-15 DIAGNOSIS — E876 Hypokalemia: Secondary | ICD-10-CM | POA: Diagnosis present

## 2020-06-15 DIAGNOSIS — Z8249 Family history of ischemic heart disease and other diseases of the circulatory system: Secondary | ICD-10-CM | POA: Diagnosis not present

## 2020-06-15 DIAGNOSIS — J45909 Unspecified asthma, uncomplicated: Secondary | ICD-10-CM | POA: Diagnosis present

## 2020-06-15 DIAGNOSIS — R0789 Other chest pain: Secondary | ICD-10-CM | POA: Diagnosis present

## 2020-06-15 DIAGNOSIS — F1020 Alcohol dependence, uncomplicated: Secondary | ICD-10-CM | POA: Diagnosis not present

## 2020-06-15 DIAGNOSIS — Y92009 Unspecified place in unspecified non-institutional (private) residence as the place of occurrence of the external cause: Secondary | ICD-10-CM | POA: Diagnosis not present

## 2020-06-15 DIAGNOSIS — F10231 Alcohol dependence with withdrawal delirium: Secondary | ICD-10-CM | POA: Diagnosis not present

## 2020-06-15 DIAGNOSIS — T405X1A Poisoning by cocaine, accidental (unintentional), initial encounter: Secondary | ICD-10-CM | POA: Diagnosis present

## 2020-06-15 DIAGNOSIS — F1024 Alcohol dependence with alcohol-induced mood disorder: Secondary | ICD-10-CM | POA: Diagnosis present

## 2020-06-15 DIAGNOSIS — Z79899 Other long term (current) drug therapy: Secondary | ICD-10-CM | POA: Diagnosis not present

## 2020-06-15 DIAGNOSIS — I10 Essential (primary) hypertension: Secondary | ICD-10-CM | POA: Diagnosis present

## 2020-06-15 DIAGNOSIS — F319 Bipolar disorder, unspecified: Secondary | ICD-10-CM | POA: Diagnosis present

## 2020-06-15 DIAGNOSIS — R45851 Suicidal ideations: Secondary | ICD-10-CM | POA: Diagnosis present

## 2020-06-15 LAB — CBC
HCT: 42.9 % (ref 39.0–52.0)
Hemoglobin: 13.9 g/dL (ref 13.0–17.0)
MCH: 27.4 pg (ref 26.0–34.0)
MCHC: 32.4 g/dL (ref 30.0–36.0)
MCV: 84.6 fL (ref 80.0–100.0)
Platelets: 230 10*3/uL (ref 150–400)
RBC: 5.07 MIL/uL (ref 4.22–5.81)
RDW: 13.4 % (ref 11.5–15.5)
WBC: 5 10*3/uL (ref 4.0–10.5)
nRBC: 0 % (ref 0.0–0.2)

## 2020-06-15 LAB — BASIC METABOLIC PANEL
Anion gap: 8 (ref 5–15)
BUN: 6 mg/dL (ref 6–20)
CO2: 26 mmol/L (ref 22–32)
Calcium: 8.4 mg/dL — ABNORMAL LOW (ref 8.9–10.3)
Chloride: 102 mmol/L (ref 98–111)
Creatinine, Ser: 0.75 mg/dL (ref 0.61–1.24)
GFR, Estimated: >60 mL/min (ref >60)
Glucose, Bld: 106 mg/dL — ABNORMAL HIGH (ref 70–99)
Potassium: 3.3 mmol/L — ABNORMAL LOW (ref 3.5–5.1)
Sodium: 136 mmol/L (ref 135–145)

## 2020-06-15 MED ORDER — POTASSIUM CHLORIDE CRYS ER 20 MEQ PO TBCR
40.0000 meq | EXTENDED_RELEASE_TABLET | ORAL | Status: AC
  Administered 2020-06-15 (×2): 40 meq via ORAL
  Filled 2020-06-15 (×2): qty 2

## 2020-06-15 MED ORDER — CHLORDIAZEPOXIDE HCL 5 MG PO CAPS
10.0000 mg | ORAL_CAPSULE | Freq: Three times a day (TID) | ORAL | Status: DC
  Administered 2020-06-15 – 2020-06-17 (×6): 10 mg via ORAL
  Filled 2020-06-15 (×7): qty 2

## 2020-06-15 NOTE — Care Management Obs Status (Signed)
MEDICARE OBSERVATION STATUS NOTIFICATION   Patient Details  Name: [name] MRN: [id number] Date of Birth: Sep 27, 1970   Medicare Observation Status Notification Given:  Yes    Beckie Busing, RN 06/15/2020, 10:19 AM

## 2020-06-15 NOTE — Progress Notes (Addendum)
PROGRESS NOTE    [name]  LTJ:[id number] DOB: 1971-08-12 DOA: 06/14/2020 PCP: Patient, No Pcp Per   Brief Narrative:  [name] is a 49 y.o. male with medical history significant of HTN; bipolar; ETOH dependence; and afib presenting for ETOH detox, with CP.  He reports that between Monday and Wednesday he had too much to drink - 4-6 24-ounce cans per day.  He tried to start tapering down on Wednesday.  He has not eaten for about 4 days.  He decided to get cocaine to give himself some energy - this isn't usual for him.  His heart was racing really fast.  And now the withdrawal is kicking in - fatigue, visual hallucinations.  Usually 1 drink per day (24 ounces).  He can't remember the last time he didn't drink at all.  He has had problems with DTs in the past.  ?withdrawal seizure last Thursday.  He has been to rehab in the past.  He does desire to quit now.  He sweated a lot yesterday.  His diet has been very poor  Assessment & Plan:   Principal Problem:   Alcohol dependence with withdrawal (HCC) Active Problems:   Hypertension   Cocaine abuse (HCC)   Class 2 obesity due to excess calories with body mass index (BMI) of 39.0 to 39.9 in adult   ETOH dependence with withdrawal -Patient with chronic ETOH dependence -Has failed rehab multiple times -Currently has been drinking nonstop since 11/13 -ETOH level 43 upon admission.  Feels much better.  No withdrawal symptoms at this point in time.  CIWA has been less than 5 since admission.  Has not required any as needed Ativan.  We will switch from Ativan to Librium and continue CIWA protocol with as needed Ativan with multivitamins for 1 more day.  Hypokalemia: 3.3.  Will replace.  Essential HTN: -Previously on medications for this issue but no longer sees a PCP or takes medications Started on chlorthalidone 25 mg p.o. here.  Blood pressure controlled.  Continue this and as needed hydralazine.  Mental illness -Reported h/o  PTSD, bipolar, remote schizophrenia (patient reports that it resolved), remote self-mutilation -Suggest outpatient psychiatric evaluation once he is remote from ETOH  Cocaine abuse -Patient reports infrequent use of cocaine with associated palpitations with use -HS troponin negative x 2 -Cessation encouraged -UDS positive for cocaine and benzos.  Obesity Body mass index is 39.54 kg/m. Weight loss counseling provided.  DVT prophylaxis: enoxaparin (LOVENOX) injection 40 mg Start: 06/14/20 0915   Code Status: Full Code  Family Communication:  None present at bedside.  Plan of care discussed with patient in length and he verbalized understanding and agreed with it.  Status is: Observation  The patient will require care spanning > 2 midnights and should be moved to inpatient because: Inpatient level of care appropriate due to severity of illness  Dispo: The patient is from: Home              Anticipated d/c is to: Home              Anticipated d/c date is: 1 day              Patient currently is not medically stable to d/c.        Estimated body mass index is 39.54 kg/m as calculated from the following:   Height as of this encounter: 5\' 6"  (1.676 m).   Weight as of this encounter: 111.1 kg.  Nutritional status:               Consultants:   None  Procedures:   None  Antimicrobials:  Anti-infectives (From admission, onward)   None         Subjective: Seen and examined.  Feels much better.  No complaints.  Very motivated to seek help.  Wants to stay overnight to see how he does.  Objective: Vitals:   06/14/20 1942 06/15/20 0009 06/15/20 0313 06/15/20 0742  BP: 133/87 131/78 130/77 (!) 142/77  Pulse: 88 82 82 89  Resp: 16 18 16    Temp: 97.7 F (36.5 C) 97.8 F (36.6 C) 97.8 F (36.6 C) 98 F (36.7 C)  TempSrc: Oral Oral Oral Oral  SpO2: 94% 95% 99% 97%  Weight:      Height:        Intake/Output Summary (Last 24 hours) at 06/15/2020  1114 Last data filed at 06/15/2020 0200 Gross per 24 hour  Intake 1073.71 ml  Output --  Net 1073.71 ml   Filed Weights   06/14/20 0025  Weight: 111.1 kg    Examination:  General exam: Appears calm and comfortable  Respiratory system: Clear to auscultation. Respiratory effort normal. Cardiovascular system: S1 & S2 heard, RRR. No JVD, murmurs, rubs, gallops or clicks. No pedal edema. Gastrointestinal system: Abdomen is nondistended, soft and nontender. No organomegaly or masses felt. Normal bowel sounds heard. Central nervous system: Alert and oriented. No focal neurological deficits. Extremities: Symmetric 5 x 5 power. Skin: No rashes, lesions or ulcers    Data Reviewed: I have personally reviewed following labs and imaging studies  CBC: Recent Labs  Lab 06/14/20 0031 06/15/20 0110  WBC 6.2 5.0  HGB 14.4 13.9  HCT 45.7 42.9  MCV 85.1 84.6  PLT 286 230   Basic Metabolic Panel: Recent Labs  Lab 06/14/20 0031 06/15/20 0110  NA 138 136  K 3.5 3.3*  CL 103 102  CO2 21* 26  GLUCOSE 95 106*  BUN 7 6  CREATININE 0.88 0.75  CALCIUM 8.9 8.4*   GFR: Estimated Creatinine Clearance: 132.1 mL/min (by C-G formula based on SCr of 0.75 mg/dL). Liver Function Tests: Recent Labs  Lab 06/14/20 0031  AST 22  ALT 18  ALKPHOS 89  BILITOT 0.8  PROT 7.5  ALBUMIN 3.8   No results for input(s): LIPASE, AMYLASE in the last 168 hours. No results for input(s): AMMONIA in the last 168 hours. Coagulation Profile: No results for input(s): INR, PROTIME in the last 168 hours. Cardiac Enzymes: No results for input(s): CKTOTAL, CKMB, CKMBINDEX, TROPONINI in the last 168 hours. BNP (last 3 results) No results for input(s): PROBNP in the last 8760 hours. HbA1C: No results for input(s): HGBA1C in the last 72 hours. CBG: No results for input(s): GLUCAP in the last 168 hours. Lipid Profile: No results for input(s): CHOL, HDL, LDLCALC, TRIG, CHOLHDL, LDLDIRECT in the last 72  hours. Thyroid Function Tests: No results for input(s): TSH, T4TOTAL, FREET4, T3FREE, THYROIDAB in the last 72 hours. Anemia Panel: No results for input(s): VITAMINB12, FOLATE, FERRITIN, TIBC, IRON, RETICCTPCT in the last 72 hours. Sepsis Labs: No results for input(s): PROCALCITON, LATICACIDVEN in the last 168 hours.  Recent Results (from the past 240 hour(s))  Respiratory Panel by RT PCR (Flu A&B, Covid) - Nasopharyngeal Swab     Status: None   Collection Time: 06/14/20  2:57 AM   Specimen: Nasopharyngeal Swab  Result Value Ref Range Status   SARS Coronavirus 2  by RT PCR NEGATIVE NEGATIVE Final    Comment: (NOTE) SARS-CoV-2 target nucleic acids are NOT DETECTED.  The SARS-CoV-2 RNA is generally detectable in upper respiratoy specimens during the acute phase of infection. The lowest concentration of SARS-CoV-2 viral copies this assay can detect is 131 copies/mL. A negative result does not preclude SARS-Cov-2 infection and should not be used as the sole basis for treatment or other patient management decisions. A negative result may occur with  improper specimen collection/handling, submission of specimen other than nasopharyngeal swab, presence of viral mutation(s) within the areas targeted by this assay, and inadequate number of viral copies (<131 copies/mL). A negative result must be combined with clinical observations, patient history, and epidemiological information. The expected result is Negative.  Fact Sheet for Patients:  https://www.moore.com/  Fact Sheet for Healthcare Providers:  https://www.young.biz/  This test is no t yet approved or cleared by the [place] FDA and  has been authorized for detection and/or diagnosis of SARS-CoV-2 by FDA under an Emergency Use Authorization (EUA). This EUA will remain  in effect (meaning this test can be used) for the duration of the COVID-19 declaration under Section 564(b)(1) of the  Act, 21 U.S.C. section 360bbb-3(b)(1), unless the authorization is terminated or revoked sooner.     Influenza A by PCR NEGATIVE NEGATIVE Final   Influenza B by PCR NEGATIVE NEGATIVE Final    Comment: (NOTE) The Xpert Xpress SARS-CoV-2/FLU/RSV assay is intended as an aid in  the diagnosis of influenza from Nasopharyngeal swab specimens and  should not be used as a sole basis for treatment. Nasal washings and  aspirates are unacceptable for Xpert Xpress SARS-CoV-2/FLU/RSV  testing.  Fact Sheet for Patients: https://www.moore.com/  Fact Sheet for Healthcare Providers: https://www.young.biz/  This test is not yet approved or cleared by the [place] FDA and  has been authorized for detection and/or diagnosis of SARS-CoV-2 by  FDA under an Emergency Use Authorization (EUA). This EUA will remain  in effect (meaning this test can be used) for the duration of the  Covid-19 declaration under Section 564(b)(1) of the Act, 21  U.S.C. section 360bbb-3(b)(1), unless the authorization is  terminated or revoked. Performed at [facility], [address] [place], [place] [address]       Radiology Studies: DG Chest 2 View  Result Date: 06/14/2020 CLINICAL DATA:  Chest pain EXAM: CHEST - 2 VIEW COMPARISON:  04/21/2020 FINDINGS: The heart size and mediastinal contours are within normal limits. Both lungs are clear. The visualized skeletal structures are unremarkable. IMPRESSION: No active cardiopulmonary disease. Electronically Signed   By: Helyn Numbers MD   On: 06/14/2020 00:53    Scheduled Meds: . chlorthalidone  25 mg Oral Daily  . diazepam  5 mg Oral Q6H  . docusate sodium  100 mg Oral BID  . enoxaparin (LOVENOX) injection  40 mg Subcutaneous Q24H  . folic acid  1 mg Oral Daily  . LORazepam  0-4 mg Intravenous Q6H   Followed by  . [START ON 06/16/2020] LORazepam  0-4 mg Intravenous Q12H  . multivitamin with minerals  1 tablet Oral  Daily  . potassium chloride  40 mEq Oral Q4H  . sodium chloride flush  3 mL Intravenous Q12H  . thiamine  100 mg Oral Daily   Or  . thiamine  100 mg Intravenous Daily   Continuous Infusions: . lactated ringers 75 mL/hr at 06/14/20 1622     LOS: 0 days   Time spent: 32 minutes  Hughie Closs, MD [facility]  06/15/2020, 11:14 AM   To contact the attending provider between 7A-7P or the covering provider during after hours 7P-7A, please log into the web site [email or web address].

## 2020-06-15 NOTE — Progress Notes (Signed)
CSW attempted to see patient for an ETOH assessment. Patient sleeping soundly and did not respond to voice. Left resources at bedside.   Rodneshia Greenhouse LCSW

## 2020-06-16 LAB — BASIC METABOLIC PANEL
Anion gap: 9 (ref 5–15)
BUN: 5 mg/dL — ABNORMAL LOW (ref 6–20)
CO2: 29 mmol/L (ref 22–32)
Calcium: 8.8 mg/dL — ABNORMAL LOW (ref 8.9–10.3)
Chloride: 100 mmol/L (ref 98–111)
Creatinine, Ser: 0.73 mg/dL (ref 0.61–1.24)
GFR, Estimated: >60 mL/min (ref >60)
Glucose, Bld: 120 mg/dL — ABNORMAL HIGH (ref 70–99)
Potassium: 3.1 mmol/L — ABNORMAL LOW (ref 3.5–5.1)
Sodium: 138 mmol/L (ref 135–145)

## 2020-06-16 LAB — MAGNESIUM: Magnesium: 2 mg/dL (ref 1.7–2.4)

## 2020-06-16 MED ORDER — AMLODIPINE BESYLATE 5 MG PO TABS: 5.0000 mg | ORAL_TABLET | Freq: Every day | ORAL | 11 refills | Status: DC

## 2020-06-16 NOTE — Progress Notes (Signed)
I was called by the nurses that patient had called [facility] and threatened that he will kill himself.  This morning when I saw him, patient was completely calm and cooperative but he did not want to leave as mentioned in my discharge summary because of his apprehension.  At this point in time, it is very clear that patient is making this up as he knows that he will not be discharged if he will threaten to kill himself or others as he is very well aware of the system and he is trying to play with the system.  Unfortunately, we will need to have a sitter for him and consult psychiatry and will only be able to discharge him once they cleared him.  I have consulted psychiatry and tried to reach on-call provider and left a voicemail.  Hope that they will be able to see him sooner than later so we can discharge him if he is cleared.

## 2020-06-16 NOTE — Progress Notes (Addendum)
Per patient he continues to hallucinate and sweaty  throughout the night. Was assessed and treated as ordered.

## 2020-06-16 NOTE — Progress Notes (Signed)
[facility] called and stated that patient wants to kill himself. RN assessed patient after receiving the phone call. Patient stated ''I want to go home tomorrow not today, I'm not going to kill myself and I have no plan.'' Sitter at bedside. MD paged. Will continue to monitor patient.

## 2020-06-16 NOTE — Discharge Summary (Signed)
Physician Discharge Summary  [name] QMG:[id number] DOB: [birth year] DOA: 06/14/2020  PCP: Patient, No Pcp Per  Admit date: 06/14/2020 Discharge date: 06/16/2020  Admitted From: Home Disposition: Home  Recommendations for Outpatient Follow-up:  1. Follow up with PCP in 1-2 weeks 2. Follow with psychiatry as soon as possible 3. Please obtain BMP/CBC in one week 4. Please follow up with your PCP on the following pending results: Unresulted Labs (From admission, onward)          Start     Ordered   06/16/20 0814  Basic metabolic panel  Once,   R        06/16/20 0813   06/16/20 0814  Magnesium  Once,   R        06/16/20 0813           Home Health: None Equipment/Devices: None  Discharge Condition: Stable CODE STATUS: Full code Diet recommendation: Cardiac  Subjective: Seen and examined.  No complaints.  No anxiety or hallucination.  Did have some hallucination last night.  He was calm yesterday and today both times that I evaluated him.  Brief/Interim Summary: Marcus Chough Burtonis a 49 y.o.malewith medical history significant ofHTN; bipolar; ETOH dependence presenting for ETOH detox, with CP.He reports that between Monday and Wednesday he had too much to drink - 4-6 24-ounce cans per day. He tried to start tapering down on Wednesday.  He decided to get cocaine to give himself some energy - this isn't usual for him. His heart was racing really fast and started having some hallucinations.  He drinksusually 1 drink per day (24 ounces). He can't remember the last time he didn't drink at all. He has had problems with DTs in the past. He has been to rehab in the past.  He came to the ER to seek help with alcohol withdrawal.  He was admitted under hospitalist service.  Initially his CIWA was 20 in the emergency department and he required Ativan.  He was admitted with scheduled Valium and as needed Ativan per CIWA protocol.  I saw this gentleman yesterday and today.  Both times,  he was very reasonable and calm and denied having any complaints to me.  He did not have any upper extremity tremors both times.  His CIWA since admission has been less than 5.  He did not qualify for any Ativan however the nurse last night decided to give him Ativan 3 times in 12 hours " only because patient insisted" but he did not meet criteria per CIWA protocol.  This happened while patient was already on Librium which I switched him from Valium yesterday.  During my evaluation today, patient has no complaints, no tremors no anxiety, no hallucination.  He denied having any thoughts or ideas of hurting himself or others.  Patient has several psychiatric disorders for which he perhaps was on medication (patient very poor historian ) but lost to follow-up with psychiatry.  Currently he is not danger to himself or others.  He surely needs some help with psychiatry but that can be done as outpatient.  He is a strongly commended to find and follow with a psychiatrist and PCP.  His blood pressure was slightly elevated.  He was supposed to be on medications but he was not taking them either.  He was started on amlodipine 5 mg p.o. daily which is being continued at discharge.  Discharge Diagnoses:  Principal Problem:   Alcohol dependence with withdrawal (HCC) Active Problems:   Hypertension  Alcohol withdrawal (HCC)   Cocaine abuse (HCC)   Class 2 obesity due to excess calories with body mass index (BMI) of 39.0 to 39.9 in adult    Discharge Instructions   Allergies as of 06/16/2020   No Known Allergies     Medication List    STOP taking these medications   chlordiazePOXIDE 25 MG capsule Commonly known as: LIBRIUM     TAKE these medications   amLODipine 5 MG tablet Commonly known as: NORVASC Take 1 tablet (5 mg total) by mouth daily.       No Known Allergies  Consultations: None   Procedures/Studies: DG Chest 2 View  Result Date: 06/14/2020 CLINICAL DATA:  Chest pain EXAM:  CHEST - 2 VIEW COMPARISON:  04/21/2020 FINDINGS: The heart size and mediastinal contours are within normal limits. Both lungs are clear. The visualized skeletal structures are unremarkable. IMPRESSION: No active cardiopulmonary disease. Electronically Signed   By: Helyn NumbersAshesh  Parikh MD   On: 06/14/2020 00:53      Discharge Exam: Vitals:   06/16/20 0440 06/16/20 0721  BP: (!) 135/94 (!) 149/106  Pulse: 81 79  Resp: 17   Temp: 97.6 F (36.4 C) 98.3 F (36.8 C)  SpO2: 98% 97%   Vitals:   06/15/20 1938 06/15/20 2321 06/16/20 0440 06/16/20 0721  BP: 130/81 (!) 139/94 (!) 135/94 (!) 149/106  Pulse: 81 82 81 79  Resp: 18 17 17    Temp: 98.1 F (36.7 C) 98.3 F (36.8 C) 97.6 F (36.4 C) 98.3 F (36.8 C)  TempSrc: Oral Oral Oral Oral  SpO2: 98% 97% 98% 97%  Weight:      Height:        General: Pt is alert, awake, not in acute distress Cardiovascular: RRR, S1/S2 +, no rubs, no gallops Respiratory: CTA bilaterally, no wheezing, no rhonchi Abdominal: Soft, NT, ND, bowel sounds + Extremities: no edema, no cyanosis    The results of significant diagnostics from this hospitalization (including imaging, microbiology, ancillary and laboratory) are listed below for reference.     Microbiology: Recent Results (from the past 240 hour(s))  Respiratory Panel by RT PCR (Flu A&B, Covid) - Nasopharyngeal Swab     Status: None   Collection Time: 06/14/20  2:57 AM   Specimen: Nasopharyngeal Swab  Result Value Ref Range Status   SARS Coronavirus 2 by RT PCR NEGATIVE NEGATIVE Final    Comment: (NOTE) SARS-CoV-2 target nucleic acids are NOT DETECTED.  The SARS-CoV-2 RNA is generally detectable in upper respiratoy specimens during the acute phase of infection. The lowest concentration of SARS-CoV-2 viral copies this assay can detect is 131 copies/mL. A negative result does not preclude SARS-Cov-2 infection and should not be used as the sole basis for treatment or other patient management  decisions. A negative result may occur with  improper specimen collection/handling, submission of specimen other than nasopharyngeal swab, presence of viral mutation(s) within the areas targeted by this assay, and inadequate number of viral copies (<131 copies/mL). A negative result must be combined with clinical observations, patient history, and epidemiological information. The expected result is Negative.  Fact Sheet for Patients:  https://www.moore.com/https://www.fda.gov/media/142436/download  Fact Sheet for Healthcare Providers:  https://www.young.biz/https://www.fda.gov/media/142435/download  This test is no t yet approved or cleared by the [place] FDA and  has been authorized for detection and/or diagnosis of SARS-CoV-2 by FDA under an Emergency Use Authorization (EUA). This EUA will remain  in effect (meaning this test can be used) for the duration of the COVID-19  declaration under Section 564(b)(1) of the Act, 21 U.S.C. section 360bbb-3(b)(1), unless the authorization is terminated or revoked sooner.     Influenza A by PCR NEGATIVE NEGATIVE Final   Influenza B by PCR NEGATIVE NEGATIVE Final    Comment: (NOTE) The Xpert Xpress SARS-CoV-2/FLU/RSV assay is intended as an aid in  the diagnosis of influenza from Nasopharyngeal swab specimens and  should not be used as a sole basis for treatment. Nasal washings and  aspirates are unacceptable for Xpert Xpress SARS-CoV-2/FLU/RSV  testing.  Fact Sheet for Patients: https://www.moore.com/  Fact Sheet for Healthcare Providers: https://www.young.biz/  This test is not yet approved or cleared by the [place] FDA and  has been authorized for detection and/or diagnosis of SARS-CoV-2 by  FDA under an Emergency Use Authorization (EUA). This EUA will remain  in effect (meaning this test can be used) for the duration of the  Covid-19 declaration under Section 564(b)(1) of the Act, 21  U.S.C. section 360bbb-3(b)(1), unless the  authorization is  terminated or revoked. Performed at [facility], [address], [place], [place] [address]      Labs: BNP (last 3 results) No results for input(s): BNP in the last 8760 hours. Basic Metabolic Panel: Recent Labs  Lab 06/14/20 0031 06/15/20 0110  NA 138 136  K 3.5 3.3*  CL 103 102  CO2 21* 26  GLUCOSE 95 106*  BUN 7 6  CREATININE 0.88 0.75  CALCIUM 8.9 8.4*   Liver Function Tests: Recent Labs  Lab 06/14/20 0031  AST 22  ALT 18  ALKPHOS 89  BILITOT 0.8  PROT 7.5  ALBUMIN 3.8   No results for input(s): LIPASE, AMYLASE in the last 168 hours. No results for input(s): AMMONIA in the last 168 hours. CBC: Recent Labs  Lab 06/14/20 0031 06/15/20 0110  WBC 6.2 5.0  HGB 14.4 13.9  HCT 45.7 42.9  MCV 85.1 84.6  PLT 286 230   Cardiac Enzymes: No results for input(s): CKTOTAL, CKMB, CKMBINDEX, TROPONINI in the last 168 hours. BNP: Invalid input(s): POCBNP CBG: No results for input(s): GLUCAP in the last 168 hours. D-Dimer No results for input(s): DDIMER in the last 72 hours. Hgb A1c No results for input(s): HGBA1C in the last 72 hours. Lipid Profile No results for input(s): CHOL, HDL, LDLCALC, TRIG, CHOLHDL, LDLDIRECT in the last 72 hours. Thyroid function studies No results for input(s): TSH, T4TOTAL, T3FREE, THYROIDAB in the last 72 hours.  Invalid input(s): FREET3 Anemia work up No results for input(s): VITAMINB12, FOLATE, FERRITIN, TIBC, IRON, RETICCTPCT in the last 72 hours. Urinalysis    Component Value Date/Time   COLORURINE YELLOW 07/16/2019 1740   APPEARANCEUR HAZY (A) 07/16/2019 1740   LABSPEC 1.011 07/16/2019 1740   PHURINE 8.0 07/16/2019 1740   GLUCOSEU NEGATIVE 07/16/2019 1740   HGBUR NEGATIVE 07/16/2019 1740   BILIRUBINUR NEGATIVE 07/16/2019 1740   KETONESUR NEGATIVE 07/16/2019 1740   PROTEINUR NEGATIVE 07/16/2019 1740   UROBILINOGEN 0.2 04/09/2015 0206   NITRITE NEGATIVE 07/16/2019 1740   LEUKOCYTESUR NEGATIVE  07/16/2019 1740   Sepsis Labs Invalid input(s): PROCALCITONIN,  WBC,  LACTICIDVEN Microbiology Recent Results (from the past 240 hour(s))  Respiratory Panel by RT PCR (Flu A&B, Covid) - Nasopharyngeal Swab     Status: None   Collection Time: 06/14/20  2:57 AM   Specimen: Nasopharyngeal Swab  Result Value Ref Range Status   SARS Coronavirus 2 by RT PCR NEGATIVE NEGATIVE Final    Comment: (NOTE) SARS-CoV-2 target nucleic  acids are NOT DETECTED.  The SARS-CoV-2 RNA is generally detectable in upper respiratoy specimens during the acute phase of infection. The lowest concentration of SARS-CoV-2 viral copies this assay can detect is 131 copies/mL. A negative result does not preclude SARS-Cov-2 infection and should not be used as the sole basis for treatment or other patient management decisions. A negative result may occur with  improper specimen collection/handling, submission of specimen other than nasopharyngeal swab, presence of viral mutation(s) within the areas targeted by this assay, and inadequate number of viral copies (<131 copies/mL). A negative result must be combined with clinical observations, patient history, and epidemiological information. The expected result is Negative.  Fact Sheet for Patients:  https://www.moore.com/  Fact Sheet for Healthcare Providers:  https://www.young.biz/  This test is no t yet approved or cleared by the [place] FDA and  has been authorized for detection and/or diagnosis of SARS-CoV-2 by FDA under an Emergency Use Authorization (EUA). This EUA will remain  in effect (meaning this test can be used) for the duration of the COVID-19 declaration under Section 564(b)(1) of the Act, 21 U.S.C. section 360bbb-3(b)(1), unless the authorization is terminated or revoked sooner.     Influenza A by PCR NEGATIVE NEGATIVE Final   Influenza B by PCR NEGATIVE NEGATIVE Final    Comment: (NOTE) The Xpert  Xpress SARS-CoV-2/FLU/RSV assay is intended as an aid in  the diagnosis of influenza from Nasopharyngeal swab specimens and  should not be used as a sole basis for treatment. Nasal washings and  aspirates are unacceptable for Xpert Xpress SARS-CoV-2/FLU/RSV  testing.  Fact Sheet for Patients: https://www.moore.com/  Fact Sheet for Healthcare Providers: https://www.young.biz/  This test is not yet approved or cleared by the [place] FDA and  has been authorized for detection and/or diagnosis of SARS-CoV-2 by  FDA under an Emergency Use Authorization (EUA). This EUA will remain  in effect (meaning this test can be used) for the duration of the  Covid-19 declaration under Section 564(b)(1) of the Act, 21  U.S.C. section 360bbb-3(b)(1), unless the authorization is  terminated or revoked. Performed at [facility], [address], [place], [place] [address]      Time coordinating discharge: Over 30 minutes  SIGNED:   Hughie Closs, MD  [facility] 06/16/2020, 9:11 AM  If 7PM-7AM, please contact night-coverage [email or web address]

## 2020-06-16 NOTE — Progress Notes (Signed)
Paged on call due to pt requesting Ativan despite no IV access (MD aware). Pt reports feeling agitated and is slightly tremorous at this time. Pt does have order for IV Ativan, however I am told by the on call that the rounding MD today was adamant the patient should receive no Ativan today at all despite the CIWA and ativan remaining in the orders. Discussed the above with on call as well as the ongoing CIWA scores, and she says "MD was very clear today that the patient was not to receive any ativan, and did not even want the patient to get the dose at 0600 earlier today. Do not give it, try the scheduled Librium first, and call me if the patient does not improve or worsens and I may put in a one time dose of something. I do not want to switch the patient to PO ativan and please do not restart an IV to give the IV form. Pass along in the AM that we need to get that order DC'd."  At 2207, discovered pt asleep and restful with sitter at bedside. Sitter endorses the patient has been calm and restful. Pt rouses to take scheduled medications, denies further needs or worsening of anxiety. Pt agrees to call or notify sitter if he begins to feel worse.

## 2020-06-17 ENCOUNTER — Emergency Department (HOSPITAL_COMMUNITY)
Admission: EM | Admit: 2020-06-17 | Discharge: 2020-06-19 | Disposition: A | Discharge status: Home / Self Care | Payer: Medicare Other | Attending: Emergency Medicine | Admitting: Emergency Medicine

## 2020-06-17 ENCOUNTER — Other Ambulatory Visit: Payer: Self-pay

## 2020-06-17 ENCOUNTER — Emergency Department (HOSPITAL_COMMUNITY): Payer: Medicare Other

## 2020-06-17 ENCOUNTER — Ambulatory Visit (HOSPITAL_COMMUNITY)
Admission: EM | Admit: 2020-06-17 | Discharge: 2020-06-17 | Disposition: A | Discharge status: Home / Self Care | Payer: Medicare Other | Attending: Psychiatry | Admitting: Psychiatry

## 2020-06-17 ENCOUNTER — Encounter (HOSPITAL_COMMUNITY): Payer: Self-pay

## 2020-06-17 DIAGNOSIS — F1014 Alcohol abuse with alcohol-induced mood disorder: Secondary | ICD-10-CM | POA: Insufficient documentation

## 2020-06-17 DIAGNOSIS — F419 Anxiety disorder, unspecified: Secondary | ICD-10-CM | POA: Insufficient documentation

## 2020-06-17 DIAGNOSIS — F1010 Alcohol abuse, uncomplicated: Secondary | ICD-10-CM | POA: Insufficient documentation

## 2020-06-17 DIAGNOSIS — F1020 Alcohol dependence, uncomplicated: Secondary | ICD-10-CM | POA: Diagnosis not present

## 2020-06-17 DIAGNOSIS — Z5900 Homelessness unspecified: Secondary | ICD-10-CM | POA: Insufficient documentation

## 2020-06-17 DIAGNOSIS — I10 Essential (primary) hypertension: Secondary | ICD-10-CM | POA: Insufficient documentation

## 2020-06-17 DIAGNOSIS — J45909 Unspecified asthma, uncomplicated: Secondary | ICD-10-CM | POA: Insufficient documentation

## 2020-06-17 DIAGNOSIS — R45851 Suicidal ideations: Secondary | ICD-10-CM | POA: Insufficient documentation

## 2020-06-17 DIAGNOSIS — Z20822 Contact with and (suspected) exposure to covid-19: Secondary | ICD-10-CM | POA: Insufficient documentation

## 2020-06-17 DIAGNOSIS — F4310 Post-traumatic stress disorder, unspecified: Secondary | ICD-10-CM | POA: Insufficient documentation

## 2020-06-17 DIAGNOSIS — F332 Major depressive disorder, recurrent severe without psychotic features: Secondary | ICD-10-CM | POA: Insufficient documentation

## 2020-06-17 DIAGNOSIS — Z79899 Other long term (current) drug therapy: Secondary | ICD-10-CM | POA: Insufficient documentation

## 2020-06-17 LAB — COMPREHENSIVE METABOLIC PANEL
ALT: 17 U/L (ref 0–44)
AST: 21 U/L (ref 15–41)
Albumin: 3.9 g/dL (ref 3.5–5.0)
Alkaline Phosphatase: 92 U/L (ref 38–126)
Anion gap: 12 (ref 5–15)
BUN: 9 mg/dL (ref 6–20)
CO2: 26 mmol/L (ref 22–32)
Calcium: 8.9 mg/dL (ref 8.9–10.3)
Chloride: 101 mmol/L (ref 98–111)
Creatinine, Ser: 0.88 mg/dL (ref 0.61–1.24)
GFR, Estimated: >60 mL/min (ref >60)
Glucose, Bld: 100 mg/dL — ABNORMAL HIGH (ref 70–99)
Potassium: 3.3 mmol/L — ABNORMAL LOW (ref 3.5–5.1)
Sodium: 139 mmol/L (ref 135–145)
Total Bilirubin: 0.7 mg/dL (ref 0.3–1.2)
Total Protein: 7.3 g/dL (ref 6.5–8.1)

## 2020-06-17 LAB — CBC
HCT: 48.3 % (ref 39.0–52.0)
Hemoglobin: 15 g/dL (ref 13.0–17.0)
MCH: 26.8 pg (ref 26.0–34.0)
MCHC: 31.1 g/dL (ref 30.0–36.0)
MCV: 86.3 fL (ref 80.0–100.0)
Platelets: 253 10*3/uL (ref 150–400)
RBC: 5.6 MIL/uL (ref 4.22–5.81)
RDW: 13.3 % (ref 11.5–15.5)
WBC: 8.5 10*3/uL (ref 4.0–10.5)
nRBC: 0 % (ref 0.0–0.2)

## 2020-06-17 LAB — MAGNESIUM: Magnesium: 1.9 mg/dL (ref 1.7–2.4)

## 2020-06-17 LAB — BASIC METABOLIC PANEL
Anion gap: 10 (ref 5–15)
BUN: 6 mg/dL (ref 6–20)
CO2: 29 mmol/L (ref 22–32)
Calcium: 8.5 mg/dL — ABNORMAL LOW (ref 8.9–10.3)
Chloride: 98 mmol/L (ref 98–111)
Creatinine, Ser: 0.79 mg/dL (ref 0.61–1.24)
GFR, Estimated: >60 mL/min (ref >60)
Glucose, Bld: 103 mg/dL — ABNORMAL HIGH (ref 70–99)
Potassium: 3.1 mmol/L — ABNORMAL LOW (ref 3.5–5.1)
Sodium: 137 mmol/L (ref 135–145)

## 2020-06-17 LAB — ACETAMINOPHEN LEVEL: Acetaminophen (Tylenol), Serum: <10 ug/mL — ABNORMAL LOW (ref 10–30)

## 2020-06-17 LAB — TROPONIN I (HIGH SENSITIVITY): Troponin I (High Sensitivity): 7 ng/L (ref <18)

## 2020-06-17 LAB — ETHANOL: Alcohol, Ethyl (B): <10 mg/dL (ref <10)

## 2020-06-17 LAB — SALICYLATE LEVEL: Salicylate Lvl: <7 mg/dL — ABNORMAL LOW (ref 7.0–30.0)

## 2020-06-17 MED ORDER — LORAZEPAM 1 MG PO TABS
1.0000 mg | ORAL_TABLET | Freq: Once | ORAL | Status: AC
  Filled 2020-06-17: qty 1

## 2020-06-17 NOTE — ED Notes (Signed)
Pt discharged in no acute distress. Verbalized understanding of discharge instructions reviewed over by writer. Denies SI/HI. All belongings returned to pt intact from locker #25. Pt escorted to lobby with bus pass for transport to home. Safety maintained.

## 2020-06-17 NOTE — ED Provider Notes (Signed)
[facility] Medical Screening Exam  Patient Name: [name] MRN: [id number] Date of Evaluation: 06/17/20 Chief Complaint:   Diagnosis:  Final diagnoses:  Severe episode of recurrent major depressive disorder, without psychotic features (HCC)    History of Present illness: [name] is a 49 y.o. male presented to [facility] reporting " I was told that I could get help with my drinking, Like a long term placement." He is awake, alert and oriented x3. [name] denied suicidal or homicidal ideations. Denies auditory or visual hallucinations. Patient does reports worsening depression related to alcohol abuse. Reports he is homeless as he states he is unable to return back home to his father's house. Patient provided verbal authorization to follow-up with his father and brother. See TTS counselor. Assessment notes for additional collateral. Additional outpatient resources was provided.  Psychiatric Specialty Exam  Presentation  General Appearance:Appropriate for Environment  Eye Contact:Fair  Speech:Clear and Coherent  Speech Volume:Normal  Handedness:Right   Mood and Affect  Mood:Depressed;Anxious  Affect:Congruent   Thought Process  Thought Processes:Goal Directed  Descriptions of Associations:Intact  Orientation:Full (Time, Place and Person)  Thought Content:Logical  Hallucinations:None  Ideas of Reference:None  Suicidal Thoughts:No  Homicidal Thoughts:No   Sensorium  Memory:Immediate Fair;Recent Fair  Judgment:Intact  Insight:Fair   Executive Functions  Concentration:Fair  Attention Span:Fair  Recall:Fair  Fund of Knowledge:Fair  Language:Fair   Psychomotor Activity  Psychomotor Activity:Normal   Assets  Assets:Desire for Improvement;Communication Skills;Housing;Transportation   Sleep  Sleep:Fair  Number of hours: 6   Physical Exam: Physical Exam ROS Blood pressure (!) 145/105, pulse 98, temperature (!) 97.3 F  (36.3 C), temperature source Temporal, resp. rate 20, height 5\' 6"  (1.676 m), weight 245 lb (111.1 kg), SpO2 98 %. Body mass index is 39.54 kg/m.  Musculoskeletal: Strength & Muscle Tone: within normal limits Gait & Station: normal Patient leans: N/A   [facility] MSE Discharge Disposition for Follow up and Recommendations: Based on my evaluation the patient does not appear to have an emergency medical condition and can be discharged with resources and follow up care in outpatient services for Substance Abuse Intensive Outpatient Program and Individual Therapy   -Outpatient resources was provided for residential treatment facility for alcohol/substance abuse    , NP 06/17/2020, 2:45 PM

## 2020-06-17 NOTE — Consult Note (Signed)
[facility] Face-to-Face Psychiatry Consult   Reason for Consult:  Alcohol use and depression Referring Physician:  Dr Orest Dikes Patient Identification: [name] MRN:  [id number] Principal Diagnosis: Alcohol abuse with alcohol-induced mood disorder (HCC) Diagnosis:  Principal Problem:   Alcohol abuse with alcohol-induced mood disorder (HCC) Active Problems:   Hypertension   Alcohol withdrawal (HCC)   Alcohol dependence with withdrawal (HCC)   Cocaine abuse (HCC)   Class 2 obesity due to excess calories with body mass index (BMI) of 39.0 to 39.9 in adult   Total Time spent with patient: 45 minutes  Subjective:   [name] is a 49 y.o. male patient admitted with alcohol detox.  HPI:  49 yo male admitted for alcohol detox, medically cleared then reported some suicidal ideations.  Today on assessment in person with this provider, he denies suicidal/homicidal ideations, hallucinations, and withdrawal symptoms.  He reports drinking 6-24 oz beers daily, last drink was Tuesday when he also used some cocaine to increase his energy level.  He wants to go to rehab and was at [facility] in 2014 and maintained sobriety for 4-5 years.  Frustrated that his payee, his father, is not assisting with him getting help along with mental health care.  Explained to him the role of the [facility] for assistance with alcohol use and rehab connections.   He is agreeable with the plan with request for a cab as he has a "bad left leg."  Let him know we would request it.  Psychiatrically stable for follow up with lower level of care.    Past Psychiatric History: alcohol use d/o, depression  Risk to Self:   Risk to Others:   Prior Inpatient Therapy:   Prior Outpatient Therapy:    Past Medical History:  Past Medical History:  Diagnosis Date  . Anxiety   . Asthma   . Atrial fibrillation (HCC)    not on AC  . Bipolar 1 disorder (HCC)   . Depression   . Heart palpitations    with  severe anxiety  . Hypertension   . PTSD (post-traumatic stress disorder)     Past Surgical History:  Procedure Laterality Date  . NO PAST SURGERIES     Family History:  Family History  Problem Relation Age of Onset  . Alcohol abuse Mother   . Hypertension Father   . Dementia Father   . Alcohol abuse Maternal Grandfather    Family Psychiatric  History: see above Social History:  Social History   Substance and Sexual Activity  Alcohol Use Yes   Comment: 4- 24oz beer daily     Social History   Substance and Sexual Activity  Drug Use Yes  . Types: Cocaine    Social History   Socioeconomic History  . Marital status: Single    Spouse name: Not on file  . Number of children: Not on file  . Years of education: Not on file  . Highest education level: Not on file  Occupational History  . Occupation: disabled  Tobacco Use  . Smoking status: Never Smoker  . Smokeless tobacco: Never Used  Vaping Use  . Vaping Use: Never used  Substance and Sexual Activity  . Alcohol use: Yes    Comment: 4- 24oz beer daily  . Drug use: Yes    Types: Cocaine  . Sexual activity: Never  Other Topics Concern  . Not on file  Social History Narrative   ** Merged History Encounter **  Social Determinants of Health   Financial Resource Strain:   . Difficulty of Paying Living Expenses: Not on file  Food Insecurity:   . Worried About Programme researcher, broadcasting/film/video in the Last Year: Not on file  . Ran Out of Food in the Last Year: Not on file  Transportation Needs:   . Lack of Transportation (Medical): Not on file  . Lack of Transportation (Non-Medical): Not on file  Physical Activity:   . Days of Exercise per Week: Not on file  . Minutes of Exercise per Session: Not on file  Stress:   . Feeling of Stress : Not on file  Social Connections:   . Frequency of Communication with Friends and Family: Not on file  . Frequency of Social Gatherings with Friends and Family: Not on file  . Attends  Religious Services: Not on file  . Active Member of Clubs or Organizations: Not on file  . Attends Banker Meetings: Not on file  . Marital Status: Not on file   Additional Social History:    Allergies:  No Known Allergies  Labs:  Results for orders placed or performed during the hospital encounter of 06/14/20 (from the past 48 hour(s))  Basic metabolic panel     Status: Abnormal   Collection Time: 06/16/20 10:05 AM  Result Value Ref Range   Sodium 138 135 - 145 mmol/L   Potassium 3.1 (L) 3.5 - 5.1 mmol/L   Chloride 100 98 - 111 mmol/L   CO2 29 22 - 32 mmol/L   Glucose, Bld 120 (H) 70 - 99 mg/dL    Comment: Glucose reference range applies only to samples taken after fasting for at least 8 hours.   BUN 5 (L) 6 - 20 mg/dL   Creatinine, Ser 6.71 0.61 - 1.24 mg/dL   Calcium 8.8 (L) 8.9 - 10.3 mg/dL   GFR, Estimated >24 >58 mL/min   Anion gap 9 5 - 15    Comment: Performed at [facility], [address] [place], [place] [address]  Magnesium     Status: None   Collection Time: 06/16/20 10:05 AM  Result Value Ref Range   Magnesium 2.0 1.7 - 2.4 mg/dL    Comment: Performed at [facility], [address] [place], [place] [address]  Basic metabolic panel     Status: Abnormal   Collection Time: 06/17/20  8:22 AM  Result Value Ref Range   Sodium 137 135 - 145 mmol/L   Potassium 3.1 (L) 3.5 - 5.1 mmol/L   Chloride 98 98 - 111 mmol/L   CO2 29 22 - 32 mmol/L   Glucose, Bld 103 (H) 70 - 99 mg/dL    Comment: Glucose reference range applies only to samples taken after fasting for at least 8 hours.   BUN 6 6 - 20 mg/dL   Creatinine, Ser 5.39 0.61 - 1.24 mg/dL   Calcium 8.5 (L) 8.9 - 10.3 mg/dL   GFR, Estimated >76 >73 mL/min   Anion gap 10 5 - 15    Comment: Performed at [facility], [address], [place], [place] [address]  Magnesium     Status: None   Collection Time: 06/17/20  8:22 AM  Result Value Ref Range   Magnesium 1.9 1.7 - 2.4 mg/dL     Comment: Performed at [facility], [address], [place], [place] [address]    Current Facility-Administered Medications  Medication Dose Route Frequency Provider Last  Rate Last Admin  . acetaminophen (TYLENOL) tablet 650 mg  650 mg Oral Q6H PRN Jonah BlueYates, Jennifer, MD       Or  . acetaminophen (TYLENOL) suppository 650 mg  650 mg Rectal Q6H PRN Jonah BlueYates, Jennifer, MD      . bisacodyl (DULCOLAX) EC tablet 5 mg  5 mg Oral Daily PRN Jonah BlueYates, Jennifer, MD      . chlordiazePOXIDE (LIBRIUM) capsule 10 mg  10 mg Oral TID Hughie ClossPahwani, Ravi, MD   10 mg at 06/17/20 0851  . chlorthalidone (HYGROTON) tablet 25 mg  25 mg Oral Daily Jonah BlueYates, Jennifer, MD   25 mg at 06/17/20 0853  . docusate sodium (COLACE) capsule 100 mg  100 mg Oral BID Jonah BlueYates, Jennifer, MD   100 mg at 06/17/20 0852  . enoxaparin (LOVENOX) injection 40 mg  40 mg Subcutaneous Q24H Jonah BlueYates, Jennifer, MD   40 mg at 06/17/20 0849  . folic acid (FOLVITE) tablet 1 mg  1 mg Oral Daily Jonah BlueYates, Jennifer, MD   1 mg at 06/17/20 [id number]  . hydrALAZINE (APRESOLINE) injection 5 mg  5 mg Intravenous Q4H PRN Jonah BlueYates, Jennifer, MD      . lactated ringers infusion   Intravenous Continuous Jonah BlueYates, Jennifer, MD 75 mL/hr at 06/15/20 2144 New Bag at 06/15/20 2144  . multivitamin with minerals tablet 1 tablet  1 tablet Oral Daily Jonah BlueYates, Jennifer, MD   1 tablet at 06/17/20 [phone]  . ondansetron (ZOFRAN) tablet 4 mg  4 mg Oral Q6H PRN Jonah BlueYates, Jennifer, MD       Or  . ondansetron [facility](ZOFRAN) injection 4 mg  4 mg Intravenous Q6H PRN Jonah BlueYates, Jennifer, MD      . polyethylene glycol (MIRALAX / GLYCOLAX) packet 17 g  17 g Oral Daily PRN Jonah BlueYates, Jennifer, MD      . sodium chloride flush (NS) 0.9 % injection 3 mL  3 mL Intravenous Q12H Jonah BlueYates, Jennifer, MD   3 mL at 06/17/20 0853  . thiamine tablet 100 mg  100 mg Oral Daily Jonah BlueYates, Jennifer, MD   100 mg at 06/17/20 [id number]   Or  . thiamine (B-1) injection 100 mg  100 mg Intravenous Daily Jonah BlueYates, Jennifer, MD   100 mg at 06/14/20 [id number]     Musculoskeletal: Strength & Muscle Tone: decreased Gait & Station: did not witness Patient leans: N/A  Psychiatric Specialty Exam: Physical Exam Vitals and nursing note reviewed.  Constitutional:      Appearance: He is well-developed.  HENT:     Head: Normocephalic.  Pulmonary:     Effort: Pulmonary effort is normal.  Musculoskeletal:     Cervical back: Normal range of motion.  Neurological:     General: No focal deficit present.     Mental Status: He is alert and oriented to person, place, and time.  Psychiatric:        Attention and Perception: Attention and perception normal.        Mood and Affect: Mood is depressed.        Speech: Speech normal.        Behavior: Behavior normal. Behavior is cooperative.        Thought Content: Thought content normal.        Cognition and Memory: Cognition and memory normal.        Judgment: Judgment normal.     Review of Systems  Psychiatric/Behavioral: Positive for dysphoric mood.  All other systems reviewed and are negative.   Blood pressure 127/87, pulse 78, temperature 98.5 F (  36.9 C), temperature source Oral, resp. rate 18, height 5\' 6"  (1.676 m), weight 111.1 kg, SpO2 96 %.Body mass index is 39.54 kg/m.  General Appearance: Casual  Eye Contact:  Good  Speech:  Normal Rate  Volume:  Normal  Mood:  Depressed  Affect:  Congruent  Thought Process:  Coherent and Descriptions of Associations: Intact  Orientation:  Full (Time, Place, and Person)  Thought Content:  WDL and Logical  Suicidal Thoughts:  No  Homicidal Thoughts:  No  Memory:  Immediate;   Good Recent;   Good Remote;   Good  Judgement:  Good  Insight:  Good  Psychomotor Activity:  Decreased  Concentration:  Concentration: Good and Attention Span: Good  Recall:  Good  Fund of Knowledge:  Good  Language:  Good  Akathisia:  No  Handed:  Right  AIMS (if indicated):     Assets:  Housing Leisure Time Physical Health Resilience Social Support  ADL's:   Intact  Cognition:  WNL  Sleep:        Treatment Plan Summary: Alcohol use disorder, moderate: -refrain from alcohol and drug use -follow up with [facility] -attend 12 step program with a sponsor  Coordination of care with social worker for a cab voucher to get to the [facility]: Phone: [phone] Address: [address] [place], [place] Hours: Open 24/7, No appointment required.  Disposition: No evidence of imminent risk to self or others at present.    [id number], NP 06/17/2020 10:32 AM

## 2020-06-17 NOTE — ED Triage Notes (Signed)
Pt comes via [facility] for CP that has been going on all day since he left, pt is also having anxiety since he has no wear to go after being discharged today. Pt having SI thoughts, plans on walking into traffic, denies HI, reports auditory hallucinations telling him to harm himself.

## 2020-06-17 NOTE — Progress Notes (Signed)
Floor coverage progress note  Per discharge summary from Dr. Jacqulyn Bath dated 10/16: "I saw this gentleman yesterday and today.  Both times, he was very reasonable and calm and denied having any complaints to me.  He did not have any upper extremity tremors both times.  His CIWA since admission has been less than 5.  He did not qualify for any Ativan however the nurse last night decided to give him Ativan 3 times in 12 hours " only because patient insisted" but he did not meet criteria per CIWA protocol.  This happened while patient was already on Librium which I switched him from Valium yesterday.  During my evaluation today, patient has no complaints, no tremors no anxiety, no hallucination."  I was paged by the patient's nurse several hours ago requesting Ativan because the patient endorsed anxiety.  Patient's CIWA score was <5 at that time and Ativan was not indicated.  I had advised her to continue his scheduled Librium.  Patient was subsequently calm and resting.  He denied any further needs or anxiety.    I was just now repaged by his nurse stating that the patient now appears anxious and tremulous.  CIWA score 5 at present.  Patient does not have IV access.  As such, order for p.o. Ativan has been placed.

## 2020-06-17 NOTE — Discharge Instructions (Signed)
Take all medications as prescribed. Keep all follow-up appointments as scheduled.  Do not consume alcohol or use illegal drugs while on prescription medications. Report any adverse effects from your medications to your primary care provider promptly.  In the event of recurrent symptoms or worsening symptoms, call 911, a crisis hotline, or go to the nearest emergency department for evaluation.   
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
## 2020-06-17 NOTE — Care Management (Signed)
Provided with two bus passes.

## 2020-06-17 NOTE — Discharge Summary (Signed)
Physician Discharge Summary  DEKE TILGHMAN WPY:[id number] DOB: 12/15/1970 DOA: 06/14/2020  PCP: Patient, No Pcp Per  Admit date: 06/14/2020 Discharge date: 06/17/2020  Admitted From: Home Disposition: Home  Recommendations for Outpatient Follow-up:  1. Follow up with PCP in 1-2 weeks 2. Follow with psychiatry as soon as possible 3. Please obtain BMP/CBC in one week 4. Please follow up with your PCP on the following pending results: Unresulted Labs (From admission, onward)         None       Home Health: None Equipment/Devices: None  Discharge Condition: Stable CODE STATUS: Full code Diet recommendation: Cardiac  Subjective: Seen and examined.  No complaints.  No anxiety or hallucination.  Did have some hallucination last night.  He was calm yesterday and today both times that I evaluated him.  Brief/Interim Summary: [name] a 49 y.o.malewith medical history significant ofHTN; bipolar; ETOH dependence presenting for ETOH detox, with CP.He reports that between Monday and Wednesday he had too much to drink - 4-6 24-ounce cans per day. He tried to start tapering down on Wednesday.  He decided to get cocaine to give himself some energy - this isn't usual for him. His heart was racing really fast and started having some hallucinations.  He drinksusually 1 drink per day (24 ounces). He can't remember the last time he didn't drink at all. He has had problems with DTs in the past. He has been to rehab in the past.  He came to the ER to seek help with alcohol withdrawal.  He was admitted under hospitalist service.  Initially his CIWA was 20 in the emergency department and he required Ativan.  He was admitted with scheduled Valium and as needed Ativan per CIWA protocol.  I saw this gentleman yesterday and day before yesterday.  Both times, he was very reasonable and calm and denied having any complaints to me.  He did not have any upper extremity tremors both times.  His CIWA  since admission has been less than 5.  He did not qualify for any Ativan however the nurse last night decided to give him Ativan 3 times in 12 hours " only because patient insisted" but he did not meet criteria per CIWA protocol.  This happened while patient was already on Librium..  During my evaluation yesterday, patient haD no complaints, no tremors no anxiety, no hallucination.  He denied having any thoughts or ideas of hurting himself or others.  Patient has several psychiatric disorders for which he perhaps was on medication (patient very poor historian ) but lost to follow-up with psychiatry. he was not danger to himself or others. His blood pressure was slightly elevated.  He was supposed to be on medications but he was not taking them either.  He was started on amlodipine 5 mg p.o. daily which is being continued at discharge.  I discussed in length with the patient about plan of discharge.  He was adamant that he needs help.  I clearly informed him that due to the fact that he is not suicidal or homicidal, his depression can be treated as outpatient and thus we discharged him.  After all the discharge paperwork and orders were placed.  Patient called behavioral health claiming that he will kill himself if he were to send home.  For this reasons, suicidal precautions and sitter were placed and psychiatry were consulted.  Right after this information, when nurse went in to evaluate him, patient clearly told the nurse that  he does not have any intentions or thoughts of killing himself but he did not agree with the discharge on 06/16/2020 but will discharge home on 06/17/2020.  Psychiatry did not see him on 06/16/2020 so he ended up staying overnight.  I saw him again today, he once again denied any suicidal or homicidal thoughts or ideations but he told me that sometimes he has " bad dreams" where he sees some people who are offering him alcohol and that he feels anxiety and depression and needs help.  I  discussed with him about voluntary admission to behavioral health and he agreed to that as well.  I then called psychiatry who eventually saw patient and cleared him for discharge and have discussed with the patient for him to go right to the [facility] to seek help for rehabilitation.  Patient agreed to this.  Patient requested a cab voucher which is being arranged for him by our TOC.  Patient is being discharged in a stable condition.  Discharge Diagnoses:  Principal Problem:   Alcohol abuse with alcohol-induced mood disorder (HCC) Active Problems:   Hypertension   Alcohol withdrawal (HCC)   Alcohol dependence with withdrawal (HCC)   Cocaine abuse (HCC)   Class 2 obesity due to excess calories with body mass index (BMI) of 39.0 to 39.9 in adult    Discharge Instructions   Allergies as of 06/17/2020   No Known Allergies     Medication List    STOP taking these medications   chlordiazePOXIDE 25 MG capsule Commonly known as: LIBRIUM     TAKE these medications   amLODipine 5 MG tablet Commonly known as: NORVASC Take 1 tablet (5 mg total) by mouth daily.       No Known Allergies  Consultations: None   Procedures/Studies: DG Chest 2 View  Result Date: 06/14/2020 CLINICAL DATA:  Chest pain EXAM: CHEST - 2 VIEW COMPARISON:  04/21/2020 FINDINGS: The heart size and mediastinal contours are within normal limits. Both lungs are clear. The visualized skeletal structures are unremarkable. IMPRESSION: No active cardiopulmonary disease. Electronically Signed   By: Helyn NumbersAshesh  Parikh MD   On: 06/14/2020 00:53     Discharge Exam: Vitals:   06/17/20 0354 06/17/20 0800  BP: (!) 138/92 127/87  Pulse: 80 78  Resp: 20 18  Temp: 98.1 F (36.7 C) 98.5 F (36.9 C)  SpO2: 100% 96%   Vitals:   06/16/20 1924 06/17/20 0019 06/17/20 0354 06/17/20 0800  BP: 137/76 (!) 142/66 (!) 138/92 127/87  Pulse: 90 74 80 78  Resp: 19 19 20 18   Temp: 98.7  F (37.1 C) 98.5 F (36.9 C) 98.1 F (36.7 C) 98.5 F (36.9 C)  TempSrc: Oral  Oral Oral  SpO2: 96% 99% 100% 96%  Weight:      Height:        General exam: Appears calm and comfortable  Respiratory system: Clear to auscultation. Respiratory effort normal. Cardiovascular system: S1 & S2 heard, RRR. No JVD, murmurs, rubs, gallops or clicks. No pedal edema. Gastrointestinal system: Abdomen is nondistended, soft and nontender. No organomegaly or masses felt. Normal bowel sounds heard. Central nervous system: Alert and oriented. No focal neurological deficits. Extremities: Symmetric 5 x 5 power. Skin: No rashes, lesions or ulcers.     The results of significant diagnostics from this hospitalization (including imaging, microbiology, ancillary and laboratory) are listed below for reference.     Microbiology: Recent Results (from the past 240 hour(s))  Respiratory  Panel by RT PCR (Flu A&B, Covid) - Nasopharyngeal Swab     Status: None   Collection Time: 06/14/20  2:57 AM   Specimen: Nasopharyngeal Swab  Result Value Ref Range Status   SARS Coronavirus 2 by RT PCR NEGATIVE NEGATIVE Final    Comment: (NOTE) SARS-CoV-2 target nucleic acids are NOT DETECTED.  The SARS-CoV-2 RNA is generally detectable in upper respiratoy specimens during the acute phase of infection. The lowest concentration of SARS-CoV-2 viral copies this assay can detect is 131 copies/mL. A negative result does not preclude SARS-Cov-2 infection and should not be used as the sole basis for treatment or other patient management decisions. A negative result may occur with  improper specimen collection/handling, submission of specimen other than nasopharyngeal swab, presence of viral mutation(s) within the areas targeted by this assay, and inadequate number of viral copies (<131 copies/mL). A negative result must be combined with clinical observations, patient history, and epidemiological information. The expected  result is Negative.  Fact Sheet for Patients:  https://www.moore.com/  Fact Sheet for Healthcare Providers:  https://www.young.biz/  This test is no t yet approved or cleared by the [place] FDA and  has been authorized for detection and/or diagnosis of SARS-CoV-2 by FDA under an Emergency Use Authorization (EUA). This EUA will remain  in effect (meaning this test can be used) for the duration of the COVID-19 declaration under Section 564(b)(1) of the Act, 21 U.S.C. section 360bbb-3(b)(1), unless the authorization is terminated or revoked sooner.     Influenza A by PCR NEGATIVE NEGATIVE Final   Influenza B by PCR NEGATIVE NEGATIVE Final    Comment: (NOTE) The Xpert Xpress SARS-CoV-2/FLU/RSV assay is intended as an aid in  the diagnosis of influenza from Nasopharyngeal swab specimens and  should not be used as a sole basis for treatment. Nasal washings and  aspirates are unacceptable for Xpert Xpress SARS-CoV-2/FLU/RSV  testing.  Fact Sheet for Patients: https://www.moore.com/  Fact Sheet for Healthcare Providers: https://www.young.biz/  This test is not yet approved or cleared by the [place] FDA and  has been authorized for detection and/or diagnosis of SARS-CoV-2 by  FDA under an Emergency Use Authorization (EUA). This EUA will remain  in effect (meaning this test can be used) for the duration of the  Covid-19 declaration under Section 564(b)(1) of the Act, 21  U.S.C. section 360bbb-3(b)(1), unless the authorization is  terminated or revoked. Performed at [facility], [address], [place], [place] [address]      Labs: BNP (last 3 results) No results for input(s): BNP in the last 8760 hours. Basic Metabolic Panel: Recent Labs  Lab 06/14/20 0031 06/15/20 0110 06/16/20 1005 06/17/20 0822  NA 138 136 138 137  K 3.5 3.3* 3.1* 3.1*  CL 103 102 100 98  CO2 21* GLUCOSE 95 106* 120* 103*  BUN 7 6 5* 6  CREATININE 0.88 0.75 0.73 0.79  CALCIUM 8.9 8.4* 8.8* 8.5*  MG  --   --  2.0 1.9   Liver Function Tests: Recent Labs  Lab 06/14/20 0031  AST 22  ALT 18  ALKPHOS 89  BILITOT 0.8  PROT 7.5  ALBUMIN 3.8   No results for input(s): LIPASE, AMYLASE in the last 168 hours. No results for input(s): AMMONIA in the last 168 hours. CBC: Recent Labs  Lab 06/14/20 0031 06/15/20 0110  WBC 6.2 5.0  HGB 14.4 13.9  HCT 45.7 42.9  MCV 85.1 84.6  PLT 286  230   Cardiac Enzymes: No results for input(s): CKTOTAL, CKMB, CKMBINDEX, TROPONINI in the last 168 hours. BNP: Invalid input(s): POCBNP CBG: No results for input(s): GLUCAP in the last 168 hours. D-Dimer No results for input(s): DDIMER in the last 72 hours. Hgb A1c No results for input(s): HGBA1C in the last 72 hours. Lipid Profile No results for input(s): CHOL, HDL, LDLCALC, TRIG, CHOLHDL, LDLDIRECT in the last 72 hours. Thyroid function studies No results for input(s): TSH, T4TOTAL, T3FREE, THYROIDAB in the last 72 hours.  Invalid input(s): FREET3 Anemia work up No results for input(s): VITAMINB12, FOLATE, FERRITIN, TIBC, IRON, RETICCTPCT in the last 72 hours. Urinalysis    Component Value Date/Time   COLORURINE YELLOW 07/16/2019 1740   APPEARANCEUR HAZY (A) 07/16/2019 1740   LABSPEC 1.011 07/16/2019 1740   PHURINE 8.0 07/16/2019 1740   GLUCOSEU NEGATIVE 07/16/2019 1740   HGBUR NEGATIVE 07/16/2019 1740   BILIRUBINUR NEGATIVE 07/16/2019 1740   KETONESUR NEGATIVE 07/16/2019 1740   PROTEINUR NEGATIVE 07/16/2019 1740   UROBILINOGEN 0.2 04/09/2015 0206   NITRITE NEGATIVE 07/16/2019 1740   LEUKOCYTESUR NEGATIVE 07/16/2019 1740   Sepsis Labs Invalid input(s): PROCALCITONIN,  WBC,  LACTICIDVEN Microbiology Recent Results (from the past 240 hour(s))  Respiratory Panel by RT PCR (Flu A&B, Covid) - Nasopharyngeal Swab     Status: None   Collection Time: 06/14/20  2:57 AM   Specimen:  Nasopharyngeal Swab  Result Value Ref Range Status   SARS Coronavirus 2 by RT PCR NEGATIVE NEGATIVE Final    Comment: (NOTE) SARS-CoV-2 target nucleic acids are NOT DETECTED.  The SARS-CoV-2 RNA is generally detectable in upper respiratoy specimens during the acute phase of infection. The lowest concentration of SARS-CoV-2 viral copies this assay can detect is 131 copies/mL. A negative result does not preclude SARS-Cov-2 infection and should not be used as the sole basis for treatment or other patient management decisions. A negative result may occur with  improper specimen collection/handling, submission of specimen other than nasopharyngeal swab, presence of viral mutation(s) within the areas targeted by this assay, and inadequate number of viral copies (<131 copies/mL). A negative result must be combined with clinical observations, patient history, and epidemiological information. The expected result is Negative.  Fact Sheet for Patients:  https://www.moore.com/  Fact Sheet for Healthcare Providers:  https://www.young.biz/  This test is no t yet approved or cleared by the [place] FDA and  has been authorized for detection and/or diagnosis of SARS-CoV-2 by FDA under an Emergency Use Authorization (EUA). This EUA will remain  in effect (meaning this test can be used) for the duration of the COVID-19 declaration under Section 564(b)(1) of the Act, 21 U.S.C. section 360bbb-3(b)(1), unless the authorization is terminated or revoked sooner.     Influenza A by PCR NEGATIVE NEGATIVE Final   Influenza B by PCR NEGATIVE NEGATIVE Final    Comment: (NOTE) The Xpert Xpress SARS-CoV-2/FLU/RSV assay is intended as an aid in  the diagnosis of influenza from Nasopharyngeal swab specimens and  should not be used as a sole basis for treatment. Nasal washings and  aspirates are unacceptable for Xpert Xpress SARS-CoV-2/FLU/RSV  testing.  Fact Sheet  for Patients: https://www.moore.com/  Fact Sheet for Healthcare Providers: https://www.young.biz/  This test is not yet approved or cleared by the [place] FDA and  has been authorized for detection and/or diagnosis of SARS-CoV-2 by  FDA under an Emergency Use Authorization (EUA). This EUA will remain  in effect (meaning this test can be used) for  the duration of the  Covid-19 declaration under Section 564(b)(1) of the Act, 21  U.S.C. section 360bbb-3(b)(1), unless the authorization is  terminated or revoked. Performed at [facility], [address] [place], [place] [address]      Time coordinating discharge: Over 30 minutes  SIGNED:   Hughie Closs, MD  [facility] 06/17/2020, 10:57 AM  If 7PM-7AM, please contact night-coverage [email or web address]

## 2020-06-17 NOTE — Progress Notes (Signed)
CSW clarified with RN that patient is going to [facility] immediately following discharge, not as a follow-up appointment. CSW completed cab voucher for patient to go to [facility] for assistance.  Blenda Nicely, [place] Clinical Social Worker [phone]

## 2020-06-17 NOTE — Progress Notes (Signed)
Paged on-call due to pt c/o anxiety, discussed with Rathor pts CIWA updates following previous (CIWA 5, Librium, Ciwa 0, then upon reassessment just now due to complaints of anxiety CIWA is now a 5 again.) MD states "okay, I will order 1mg  Ativan PO due to CIWA increasing again.  Please call back if he needs more."

## 2020-06-17 NOTE — ED Notes (Signed)
Locker #[phone]  

## 2020-06-17 NOTE — Discharge Instructions (Signed)
[facility]: Phone: [phone] Address: [address] [place], [place] [address] Hours: Open 24/7, No appointment required.  Alcohol Withdrawal Syndrome Alcohol withdrawal syndrome is a group of symptoms that can develop when a person who drinks heavily and regularly stops drinking or drinks less. Alcohol withdrawal syndrome can be mild or severe, and it may even be life-threatening. Alcohol withdrawal syndrome usually affects people who have alcohol use disorder, which may also be called alcoholism. Alcohol use disorder is when a person is unable to control his or her alcohol use, and drinking too much or too often causes problems at home, at work, or in relationships. What are the causes? Drinking heavily and drinking on a regular basis cause changes in brain chemistry. Over time, the body becomes dependent on alcohol. When alcohol use stops, the chemistry system in the brain becomes unbalanced and causes the symptoms of alcohol withdrawal. What increases the risk? Alcohol withdrawal syndrome is more likely to occur in people who drink more than the recommended limit of alcohol (2 drinks a day for men or 1 drink a day for non-pregnant women). It is also more likely to affect heavy drinkers who have been using alcohol for long periods of time. The more a person drinks and the longer he or she drinks, the greater the risk of alcohol withdrawal syndrome. Severe withdrawal is more likely to develop in someone who:  Had severe alcohol withdrawal in the past.  Had a seizure during a previous episode of alcohol withdrawal.  Is elderly.  Uses other drugs.  Has a long-term (chronic) medical problem, such as heart, lung, or liver disease.  Has depression.  Does not get enough nutrients from his or her diet (malnutrition). What are the signs or symptoms? Symptoms of this condition can be mild to moderate, or they can be severe. Symptoms may develop a few hours (or up  to a day) after a person changes his or her drinking patterns. During the 48 hours after he or she has stopped drinking, the following symptoms may go away or get better:  Uncontrollable shaking (tremor).  Sweating.  Headache.  Anxiety.  Inability to relax (agitation).  Trouble sleeping (insomnia).  Irregular heartbeats (palpitations).  Alcohol cravings.  Seizure. The following symptoms may get worse 24-48 hours after a person has decreased or stopped alcohol use, and they may gradually improve over a period of days or weeks:  Nausea and vomiting.  Fatigue.  Sensitivity to light and sounds.  Confusion and inability to think clearly.  Loss of appetite.  Mood swings, irritability, depression, and anxiety.  Insomnia and nightmares. The following symptoms are severe and life-threatening. When these symptoms occur together, they are called delirium tremens (DTs):  High blood pressure.  Increased heart rate.  Trouble breathing.  Seizures. These may go away along with other symptoms, or they may persist.  Seeing, hearing, feeling, smelling, or tasting things that are not there (hallucinations). If you experience hallucinations, they usually begin 12-24 hours after a change in drinking patterns. Delirium tremens requires immediate hospitalization. How is this diagnosed? This condition may be diagnosed based on:  Your symptoms and medical history.  Your history of alcohol use. Your health care provider may ask questions about your drinking behavior. It is important to be honest when you answer these questions.  A psychological assessment.  A physical exam.  Blood tests or urine tests to measure blood alcohol level and to rule out other causes of symptoms.  MRI or  CT scan. This may be done if you seem to have abnormal thinking or behaviors (altered mental status). Diagnosis can be difficult. People going through withdrawal often avoid seeking medical care and are not  thinking clearly. Friends and family members play an important role in recognizing symptoms and encouraging loved ones to get treatment. How is this treated? Most people with symptoms of withdrawal can be treated outside of a hospital setting (outpatient treatment), with close monitoring such as daily check-ins with a health care provider and counseling. You may need treatment at a hospital or treatment center (inpatient treatment) if:  You have a history of delirium tremens or seizures.  You have severe symptoms.  You are addicted to other drugs.  You cannot swallow medicine.  You have a serious medical condition such as heart failure.  You experienced withdrawal in the past but then you continued drinking alcohol.  You are not likely to commit to an outpatient treatment schedule. Treatment may involve:  Monitoring your blood pressure, pulse, and breathing.  IV fluids to keep you hydrated.  Medicines to reduce withdrawal symptoms and discomfort (benzodiazepines).  Medicine to reduce anxiety.  Medicine to prevent or control seizures.  Multivitamins and B vitamins.  Having a health care provider check on you daily. It is important to get treatment for alcohol withdrawal early. Getting treatment early can:  Speed up your recovery from withdrawal symptoms.  Make you more successful with long-term stoppage of alcohol use (sobriety). If you need help to stop drinking, your health care provider may recommend a long-term treatment plan that includes:  Medicines to help treat alcohol use disorder.  Substance abuse counseling.  Support groups. Follow these instructions at home:   Take over-the-counter and prescription medicines (including vitamin supplements) only as told by your health care provider.  Do not drink alcohol.  Do not drive until your health care provider approves.  Have someone you trust stay with you or be available if you need help with your symptoms or  with not drinking.  Drink enough fluid to keep your urine pale yellow.  Consider joining an alcohol support group or treatment program. These can provide emotional support, advice, and guidance.  Keep all follow-up visits as told by your health care provider. This is important. Contact a health care provider if:  Your symptoms get worse instead of better.  You cannot eat or drink without vomiting.  You are struggling with not drinking alcohol.  You cannot stop drinking alcohol. Get help right away if:  You have an irregular heartbeat.  You have chest pain.  You have trouble breathing.  You have a seizure for the first time.  You hallucinate.  You become very confused. Summary  Alcohol withdrawal is a group of symptoms that can develop when a person who drinks heavily and regularly stops drinking or drinks less.  Symptoms of this condition can be mild to moderate, or they can be severe.  Treatment may include hospitalization, medicine, and counseling. This information is not intended to replace advice given to you by your health care provider. Make sure you discuss any questions you have with your health care provider. Document Revised: 07/31/2017 Document Reviewed: 04/24/2017 Elsevier Patient Education  2020 [employer].

## 2020-06-17 NOTE — Plan of Care (Signed)
Adequate for discharge.

## 2020-06-18 LAB — RESPIRATORY PANEL BY RT PCR (FLU A&B, COVID)
Influenza A by PCR: NEGATIVE
Influenza B by PCR: NEGATIVE
SARS Coronavirus 2 by RT PCR: NEGATIVE

## 2020-06-18 LAB — TROPONIN I (HIGH SENSITIVITY): Troponin I (High Sensitivity): 6 ng/L (ref <18)

## 2020-06-18 LAB — RAPID URINE DRUG SCREEN, HOSP PERFORMED
Amphetamines: NOT DETECTED
Barbiturates: NOT DETECTED
Benzodiazepines: POSITIVE — AB
Cocaine: POSITIVE — AB
Opiates: NOT DETECTED
Tetrahydrocannabinol: NOT DETECTED

## 2020-06-18 MED ORDER — LORAZEPAM 2 MG/ML IJ SOLN: 0.0000 mg | Freq: Four times a day (QID) | INTRAMUSCULAR | Status: DC

## 2020-06-18 MED ORDER — LORAZEPAM 1 MG PO TABS: 0.0000 mg | ORAL_TABLET | Freq: Four times a day (QID) | ORAL | Status: DC

## 2020-06-18 MED ORDER — THIAMINE HCL 100 MG/ML IJ SOLN: 100.0000 mg | Freq: Every day | INTRAMUSCULAR | Status: DC

## 2020-06-18 MED ORDER — LORAZEPAM 1 MG PO TABS: 0.0000 mg | ORAL_TABLET | Freq: Two times a day (BID) | ORAL | Status: DC

## 2020-06-18 MED ORDER — LORAZEPAM 2 MG/ML IJ SOLN: 0.0000 mg | Freq: Two times a day (BID) | INTRAMUSCULAR | Status: DC

## 2020-06-18 MED ORDER — THIAMINE HCL 100 MG PO TABS
100.0000 mg | ORAL_TABLET | Freq: Every day | ORAL | Status: DC
  Administered 2020-06-18: 100 mg via ORAL
  Filled 2020-06-18: qty 1

## 2020-06-18 NOTE — ED Provider Notes (Signed)
[facility] EMERGENCY DEPARTMENT Provider Note   CSN: [id number] Arrival date & time: 06/17/20  2052     History Chief Complaint  Patient presents with  . Chest Pain  . Suicidal    [name] is a 49 y.o. male presenting for evaluation of chest pain and suicidal thoughts.  Patient states he has had increased suicidal thoughts since being discharged in the hospital several days ago.  He was admitted due to alcohol withdrawal, states he has not been drinking since and this is worsening his SI.  He reports yesterday he developed chest pain, feels it was due to anxiety.  It resolved after given ASA by EMS.  He has had no chest pain since.  He denies associated shortness of breath, symptoms of pleurisy, nausea, vomiting, or diaphoresis.  He has a history of chest pain due to anxiety.  He states he has no money for his behavioral health medications, this is worsening his SI.  He reports no HI or AVH.  He denies tobacco or drug use.    HPI     Past Medical History:  Diagnosis Date  . Anxiety   . Asthma   . Atrial fibrillation (HCC)    not on AC  . Bipolar 1 disorder (HCC)   . Depression   . Heart palpitations    with severe anxiety  . Hypertension   . PTSD (post-traumatic stress disorder)     Patient Active Problem List   Diagnosis Date Noted  . Alcohol dependence with withdrawal (HCC) 06/14/2020  . Cocaine abuse (HCC) 06/14/2020  . Class 2 obesity due to excess calories with body mass index (BMI) of 39.0 to 39.9 in adult 06/14/2020  . Alcohol withdrawal (HCC) 04/21/2020  . Alcohol abuse with alcohol-induced mood disorder (HCC) 01/18/2017  . Cocaine dependence with cocaine-induced mood disorder (HCC)   . Alcohol use disorder, severe, dependence (HCC) 04/12/2015  . Hypertension 09/08/2011  . Post traumatic stress disorder (PTSD) 09/08/2011    Past Surgical History:  Procedure Laterality Date  . NO PAST SURGERIES         Family History  Problem  Relation Age of Onset  . Alcohol abuse Mother   . Hypertension Father   . Dementia Father   . Alcohol abuse Maternal Grandfather     Social History   Tobacco Use  . Smoking status: Never Smoker  . Smokeless tobacco: Never Used  Vaping Use  . Vaping Use: Never used  Substance Use Topics  . Alcohol use: Yes    Comment: 4- 24oz beer daily  . Drug use: Yes    Types: Cocaine    Home Medications Prior to Admission medications   Medication Sig Start Date End Date Taking? Authorizing Provider  amLODipine (NORVASC) 5 MG tablet Take 1 tablet (5 mg total) by mouth daily. 06/16/20 06/16/21  Hughie Closs, MD    Allergies    Patient has no known allergies.  Review of Systems   Review of Systems  Cardiovascular: Positive for chest pain (resolved).  Psychiatric/Behavioral: Positive for suicidal ideas. The patient is nervous/anxious.     Physical Exam Updated Vital Signs BP 116/80 (BP Location: Right Arm)   Pulse 84   Temp 98.3 F (36.8 C) (Oral)   Resp 18   SpO2 95%   Physical Exam Vitals and nursing note reviewed.  Constitutional:      General: He is not in acute distress.    Appearance: He is well-developed.  Comments: Sitting in the bed in no acute distress  HENT:     Head: Normocephalic and atraumatic.  Cardiovascular:     Rate and Rhythm: Normal rate and regular rhythm.     Pulses: Normal pulses.     Heart sounds: Normal heart sounds.  Pulmonary:     Effort: Pulmonary effort is normal.     Breath sounds: Normal breath sounds.     Comments: Speaking full sentences. Clear lung sounds in all fields.  No TTP of the chest wall. Abdominal:     General: There is no distension.     Tenderness: There is no abdominal tenderness. There is no guarding.  Musculoskeletal:        General: Normal range of motion.     Cervical back: Normal range of motion.  Skin:    General: Skin is warm.     Capillary Refill: Capillary refill takes less than 2 seconds.     Findings: No  rash.  Neurological:     Mental Status: He is alert and oriented to person, place, and time.  Psychiatric:        Thought Content: Thought content includes suicidal ideation.     ED Results / Procedures / Treatments   Labs (all labs ordered are listed, but only abnormal results are displayed) Labs Reviewed  COMPREHENSIVE METABOLIC PANEL - Abnormal; Notable for the following components:      Result Value   Potassium 3.3 (*)    Glucose, Bld 100 (*)    All other components within normal limits  SALICYLATE LEVEL - Abnormal; Notable for the following components:   Salicylate Lvl <7.0 (*)    All other components within normal limits  ACETAMINOPHEN LEVEL - Abnormal; Notable for the following components:   Acetaminophen (Tylenol), Serum <10 (*)    All other components within normal limits  ETHANOL  CBC  RAPID URINE DRUG SCREEN, HOSP PERFORMED  TROPONIN I (HIGH SENSITIVITY)  TROPONIN I (HIGH SENSITIVITY)    EKG EKG Interpretation  Date/Time:  Sunday June 17 2020 21:03:49 EDT Ventricular Rate:  103 PR Interval:  168 QRS Duration: 88 QT Interval:  354 QTC Calculation: 463 R Axis:   -32 Text Interpretation: Sinus tachycardia Right atrial enlargement Left axis deviation Pulmonary disease pattern Septal infarct , age undetermined Abnormal ECG When compared with ECG of 06/14/2020, No significant change was found Confirmed by Dione Booze ([id number]) on 06/17/2020 11:32:45 PM   Radiology DG Chest 2 View  Result Date: 06/17/2020 CLINICAL DATA:  Chest pain EXAM: CHEST - 2 VIEW COMPARISON:  06/14/2020 FINDINGS: Heart is normal size. Tortuous aorta. Lungs clear. No effusions. No acute bony abnormality. IMPRESSION: No active cardiopulmonary disease. Electronically Signed   By: Charlett Nose M.D.   On: 06/17/2020 21:34    Procedures Procedures (including critical care time)  Medications Ordered in ED Medications - No data to display  ED Course  I have reviewed the triage vital signs  and the nursing notes.  Pertinent labs & imaging results that were available during my care of the patient were reviewed by me and considered in my medical decision making (see chart for details).    MDM Rules/Calculators/A&P                          Patient presenting for evaluation of suicidal thoughts and chest pain.  On exam, patient appears nontoxic.  Chest pain resolved prior to my evaluation.  He reports current SI.  Labs obtained from triage interpreted by me, overall reassuring.  Troponin negative.  EKG unchanged from previous.  Doubt ACS at this time.  Patient is medically cleared for TTS evaluation.  The patient has been placed in psychiatric observation due to the need to provide a safe environment for the patient while obtaining psychiatric consultation and evaluation, as well as ongoing medical and medication management to treat the patient's condition.  The patient has not been placed under full IVC at this time.   Final Clinical Impression(s) / ED Diagnoses Final diagnoses:  None    Rx / DC Orders ED Discharge Orders    None       Alveria Apley, PA-C 06/18/20 2122    Pollyann Savoy, MD 06/19/20 [phone]

## 2020-06-18 NOTE — ED Notes (Signed)
Sitter at bedside.

## 2020-06-18 NOTE — ED Notes (Signed)
Patient being TTS in Rm 6-Monique,RN

## 2020-06-18 NOTE — ED Notes (Signed)
Lunch Tray Ordered @ 1050. 
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
## 2020-06-18 NOTE — ED Notes (Signed)
Pt belongings obtained in triage. Belongings inventoried and placed in [place] [address]8. Pt wanded by security.

## 2020-06-18 NOTE — ED Notes (Signed)
Breakfast Tray reordered @ 1023.

## 2020-06-18 NOTE — ED Notes (Signed)
Called staffing for a sitter; no sitter at this time per staffing

## 2020-06-18 NOTE — ED Notes (Signed)
Pt reports CP d/t stress and that it felt like someone was gripping the back of his neck. Denies CP at this time. Pt reports he has PTSD and no income and states, "I can't work". Pt reports that he wants inpatient help and resources for meds and social work. Pt is calm and cooperative at this time and still endorsing suicidal ideation.

## 2020-06-18 NOTE — ED Notes (Signed)
Pt called multiple times with no answer, Pt taken OTF

## 2020-06-19 ENCOUNTER — Encounter (HOSPITAL_COMMUNITY): Payer: Self-pay

## 2020-06-19 ENCOUNTER — Ambulatory Visit (HOSPITAL_COMMUNITY)
Admission: EM | Admit: 2020-06-19 | Discharge: 2020-06-20 | Disposition: A | Discharge status: Other Acute Inpatient Hospital | Payer: Medicare Other | Attending: Nurse Practitioner | Admitting: Nurse Practitioner

## 2020-06-19 ENCOUNTER — Other Ambulatory Visit: Payer: Self-pay

## 2020-06-19 DIAGNOSIS — F1020 Alcohol dependence, uncomplicated: Secondary | ICD-10-CM | POA: Insufficient documentation

## 2020-06-19 DIAGNOSIS — I10 Essential (primary) hypertension: Secondary | ICD-10-CM | POA: Insufficient documentation

## 2020-06-19 DIAGNOSIS — Z6379 Other stressful life events affecting family and household: Secondary | ICD-10-CM | POA: Insufficient documentation

## 2020-06-19 DIAGNOSIS — F333 Major depressive disorder, recurrent, severe with psychotic symptoms: Secondary | ICD-10-CM | POA: Insufficient documentation

## 2020-06-19 DIAGNOSIS — Z9152 Personal history of nonsuicidal self-harm: Secondary | ICD-10-CM | POA: Insufficient documentation

## 2020-06-19 DIAGNOSIS — Z5900 Homelessness unspecified: Secondary | ICD-10-CM | POA: Insufficient documentation

## 2020-06-19 DIAGNOSIS — F4310 Post-traumatic stress disorder, unspecified: Secondary | ICD-10-CM | POA: Insufficient documentation

## 2020-06-19 LAB — LIPID PANEL
Cholesterol: 130 mg/dL (ref 0–200)
HDL: 27 mg/dL — ABNORMAL LOW (ref >40)
LDL Cholesterol: 90 mg/dL (ref 0–99)
Total CHOL/HDL Ratio: 4.8 RATIO
Triglycerides: 67 mg/dL (ref <150)
VLDL: 13 mg/dL (ref 0–40)

## 2020-06-19 LAB — TSH: TSH: 0.979 u[IU]/mL (ref 0.350–4.500)

## 2020-06-19 MED ORDER — POTASSIUM CHLORIDE CRYS ER 20 MEQ PO TBCR
20.0000 meq | EXTENDED_RELEASE_TABLET | Freq: Once | ORAL | Status: AC
  Administered 2020-06-19: 20 meq via ORAL
  Filled 2020-06-19: qty 1

## 2020-06-19 MED ORDER — ACETAMINOPHEN 325 MG PO TABS: 650.0000 mg | ORAL_TABLET | Freq: Four times a day (QID) | ORAL | Status: DC | PRN

## 2020-06-19 MED ORDER — QUETIAPINE FUMARATE 200 MG PO TABS: 200.0000 mg | ORAL_TABLET | Freq: Every day | ORAL | Status: DC

## 2020-06-19 MED ORDER — MAGNESIUM HYDROXIDE 400 MG/5ML PO SUSP: 30.0000 mL | Freq: Every day | ORAL | Status: DC | PRN

## 2020-06-19 MED ORDER — CITALOPRAM HYDROBROMIDE 10 MG PO TABS
10.0000 mg | ORAL_TABLET | Freq: Every day | ORAL | Status: DC
  Administered 2020-06-19: 10 mg via ORAL
  Filled 2020-06-19: qty 1

## 2020-06-19 MED ORDER — AMLODIPINE BESYLATE 5 MG PO TABS: 5.0000 mg | ORAL_TABLET | Freq: Every day | ORAL | Status: DC

## 2020-06-19 MED ORDER — TRAZODONE HCL 50 MG PO TABS: 50.0000 mg | ORAL_TABLET | Freq: Every evening | ORAL | Status: DC | PRN

## 2020-06-19 MED ORDER — AMLODIPINE BESYLATE 5 MG PO TABS
5.0000 mg | ORAL_TABLET | Freq: Every day | ORAL | Status: DC
  Administered 2020-06-19: 5 mg via ORAL
  Filled 2020-06-19 (×2): qty 1

## 2020-06-19 MED ORDER — HYDROXYZINE HCL 25 MG PO TABS: 25.0000 mg | ORAL_TABLET | Freq: Three times a day (TID) | ORAL | Status: DC | PRN

## 2020-06-19 MED ORDER — QUETIAPINE FUMARATE 50 MG PO TABS
50.0000 mg | ORAL_TABLET | Freq: Every day | ORAL | Status: DC
  Administered 2020-06-19: 50 mg via ORAL
  Filled 2020-06-19: qty 1

## 2020-06-19 MED ORDER — ALUM & MAG HYDROXIDE-SIMETH 200-200-20 MG/5ML PO SUSP: 30.0000 mL | ORAL | Status: DC | PRN

## 2020-06-19 NOTE — ED Triage Notes (Signed)
Pt arrives to [facility] vis safe transport from [facility] with c/o worsening depression & SI. Denies HI/AH but does endorse VH of white shadows across his eyes 5-6x a night for 'a while'. Pt A&O, calm & cooperative with assessment & labs. Ambulating independently in NAD.

## 2020-06-19 NOTE — ED Notes (Signed)
[place] bar

## 2020-06-19 NOTE — ED Notes (Signed)
Pending possible transfer to [facility], pt resting at present, no distress noted at present, monitoring for safety.

## 2020-06-19 NOTE — ED Notes (Signed)
Patient is alert and verbal. Patient voices SI. Patient contracts for safety while on unit. Patient states he has auditory hallucinations telling him "you are no good, you will never amount to nothing" patient states voices do not tell him to harm himself. Patient provided support and encouragement. Monitoring continues.

## 2020-06-19 NOTE — ED Notes (Signed)
Dinner given- sandwich, chips, drink

## 2020-06-19 NOTE — ED Provider Notes (Signed)
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 [facility], [address], [place], [place] [address]  . Sodium 06/16/2020 138  135 - 145 mmol/L Final  . Potassium 06/16/2020 3.1* 3.5 - 5.1 mmol/L Final  . Chloride 06/16/2020 100   98 - 111 mmol/L Final  . CO2 06/16/2020 29  22 - 32 mmol/L Final  . Glucose, Bld 06/16/2020 120* 70 - 99 mg/dL Final   Glucose reference range applies only to samples taken after fasting for at least 8 hours.  . BUN 06/16/2020 5* 6 - 20 mg/dL Final  . Creatinine, Ser 06/16/2020 0.73  0.61 - 1.24 mg/dL Final  . Calcium 25/36/6440 8.8* 8.9 - 10.3 mg/dL  Final  . GFR, Estimated 06/16/2020 >60  >60 mL/min Final  . Anion gap 06/16/2020 9  5 - 15 Final   Performed at [facility], [address], [place], [place] [address]  . Magnesium 06/16/2020 2.0  1.7 - 2.4 mg/dL Final   Performed at [facility], [address] [place], [place] [address]  . Sodium 06/17/2020 137  135 - 145 mmol/L Final  . Potassium 06/17/2020 3.1* 3.5 - 5.1 mmol/L Final  . Chloride 06/17/2020 98  98 - 111 mmol/L Final  . CO2 06/17/2020 29  22 - 32 mmol/L Final  . Glucose, Bld 06/17/2020 103* 70 - 99 mg/dL Final   Glucose reference range applies only to samples taken after fasting for at least 8 hours.  . BUN 06/17/2020 6  6 - 20 mg/dL Final  . Creatinine, Ser 06/17/2020 0.79  0.61 - 1.24 mg/dL Final  . Calcium 02/54/2706 8.5* 8.9 - 10.3 mg/dL Final  . GFR, Estimated 06/17/2020 >60  >60 mL/min Final  . Anion gap 06/17/2020 10  5 - 15 Final   Performed at [facility], [address] [place], [place] [address]  . Magnesium 06/17/2020 1.9  1.7 - 2.4 mg/dL Final   Performed at [name], [address] [place], [place] [address]  Admission on 04/21/2020, Discharged on 04/23/2020  Component Date Value Ref Range Status  . SARS Coronavirus 2 04/21/2020 NEGATIVE  NEGATIVE Final   Comment: (NOTE) SARS-CoV-2 target nucleic acids are NOT DETECTED.  The SARS-CoV-2 RNA is generally detectable in upper and lower respiratory specimens during the acute phase of infection. The lowest concentration of SARS-CoV-2 viral copies this assay can detect is 250 copies / mL. A negative result does not preclude  SARS-CoV-2 infection and should not be used as the sole basis for treatment or other patient management decisions.  A negative result may occur with improper specimen collection / handling, submission of specimen other than nasopharyngeal swab, presence of viral mutation(s) within the areas targeted by this assay, and inadequate number of viral copies (<250 copies / mL). A negative result must be combined with clinical observations, patient history, and epidemiological information.  Fact Sheet for Patients:   BoilerBrush.com.cy  Fact Sheet for Healthcare Providers: https://pope.com/  This test is not yet approved or                           cleared by the [place] FDA and has been authorized for detection and/or diagnosis of SARS-CoV-2 by FDA under an Emergency Use Authorization (EUA).  This EUA will remain in effect (meaning this test can be used) for the duration of the COVID-19 declaration under Section 564(b)(1) of the Act, 21 U.S.C. section 360bbb-3(b)(1), unless the authorization is terminated or revoked sooner.  Performed at [facility], [address], [place], [place] [address]   . Sodium 04/21/2020 137  135 - 145 mmol/L Final  . Potassium 04/21/2020 4.0  3.5 - 5.1 mmol/L Final  . Chloride 04/21/2020 102  98 - 111 mmol/L Final  . CO2 04/21/2020 23  22 - 32 mmol/L Final  . Glucose, Bld 04/21/2020 97  70 - 99 mg/dL Final   Glucose reference range applies only to samples taken after fasting for at least 8 hours.  . BUN 04/21/2020 6  6 - 20 mg/dL Final  . Creatinine, Ser 04/21/2020 0.65  0.61 - 1.24 mg/dL Final  .  Calcium 04/21/2020 7.9* 8.9 - 10.3 mg/dL Final  . Total Protein 04/21/2020 7.7  6.5 - 8.1 g/dL Final  . Albumin 16/06/9603 4.3  3.5 - 5.0 g/dL Final  . AST 54/05/8118 26  15 - 41 U/L Final  . ALT 04/21/2020 19  0 - 44 U/L Final  . Alkaline Phosphatase 04/21/2020 99  38 - 126 U/L Final  .  Total Bilirubin 04/21/2020 0.5  0.3 - 1.2 mg/dL Final  . GFR calc non Af Amer 04/21/2020 >60  >60 mL/min Final  . GFR calc Af Amer 04/21/2020 >60  >60 mL/min Final  . Anion gap 04/21/2020 12  5 - 15 Final   Performed at [facility], [address], [place], [place] [address]  . Alcohol, Ethyl (B) 04/21/2020 15* <10 mg/dL Final   Comment: (NOTE) Lowest detectable limit for serum alcohol is 10 mg/dL.  For medical purposes only. Performed at [facility] [address], [place], [place] [address]   . Opiates 04/21/2020 NONE DETECTED  NONE DETECTED Final  . Cocaine 04/21/2020 NONE DETECTED  NONE DETECTED Final  . Benzodiazepines 04/21/2020 NONE DETECTED  NONE DETECTED Final  . Amphetamines 04/21/2020 NONE DETECTED  NONE DETECTED Final  . Tetrahydrocannabinol 04/21/2020 NONE DETECTED  NONE DETECTED Final  . Barbiturates 04/21/2020 NONE DETECTED  NONE DETECTED Final   Comment: (NOTE) DRUG SCREEN FOR MEDICAL PURPOSES ONLY.  IF CONFIRMATION IS NEEDED FOR ANY PURPOSE, NOTIFY LAB WITHIN 5 DAYS.  LOWEST DETECTABLE LIMITS FOR URINE DRUG SCREEN Drug Class                     Cutoff (ng/mL) Amphetamine and metabolites    1000 Barbiturate and metabolites    200 Benzodiazepine                 200 Tricyclics and metabolites     300 Opiates and metabolites        300 Cocaine and metabolites        300 THC                            50 Performed at [facility], [address], [place], [place] [address]   . WBC 04/21/2020 7.2  4.0 - 10.5 K/uL Final  . RBC 04/21/2020 5.15  4.22 - 5.81 MIL/uL Final  . Hemoglobin 04/21/2020 14.1  13.0 - 17.0 g/dL Final  . HCT 78/46/9629 43.7  39 - 52 % Final  . MCV 04/21/2020 84.9  80.0 - 100.0 fL Final  . MCH 04/21/2020 27.4  26.0 - 34.0 pg Final  . MCHC 04/21/2020 32.3  30.0 - 36.0 g/dL Final  . RDW 52/84/1324 13.9  11.5 - 15.5 % Final  . Platelets 04/21/2020 260  150 - 400 K/uL Final  . nRBC  04/21/2020 0.0  0.0 - 0.2 % Final  . Neutrophils Relative % 04/21/2020 76  % Final  . Neutro Abs 04/21/2020 5.5  1.7 - 7.7 K/uL Final  . Lymphocytes Relative 04/21/2020 17  % Final  . Lymphs Abs 04/21/2020 1.2  0.7 - 4.0 K/uL Final  . Monocytes Relative 04/21/2020 7  % Final  . Monocytes Absolute 04/21/2020 0.5  0.1 - 1.0 K/uL Final  . Eosinophils Relative 04/21/2020 0  % Final  . Eosinophils Absolute 04/21/2020 0.0  0.0 - 0.5 K/uL Final  . Basophils Relative 04/21/2020 0  % Final  . Basophils Absolute  04/21/2020 0.0  0.0 - 0.1 K/uL Final  . Immature Granulocytes 04/21/2020 0  % Final  . Abs Immature Granulocytes 04/21/2020 0.02  0.00 - 0.07 K/uL Final   Performed at [facility], [address], [place], [place] [address]  . Salicylate Lvl 04/21/2020 <7.0* 7.0 - 30.0 mg/dL Final   Performed at [facility], [address] [place], [place] [address]  . Acetaminophen (Tylenol), Serum 04/21/2020 <10* 10 - 30 ug/mL Final   Comment: (NOTE) Therapeutic concentrations vary significantly. A range of 10-30 ug/mL  may be an effective concentration for many patients. However, some  are best treated at concentrations outside of this range. Acetaminophen concentrations >150 ug/mL at 4 hours after ingestion  and >50 ug/mL at 12 hours after ingestion are often associated with  toxic reactions.  Performed at [facility], [address] [place], [place] [address]   . Troponin I (High Sensitivity) 04/21/2020 4  <18 ng/L Final   Comment: (NOTE) Elevated high sensitivity troponin I (hsTnI) values and significant  changes across serial measurements may suggest ACS but many other  chronic and acute conditions are known to elevate hsTnI results.  Refer to the "Links" section for chest pain algorithms and additional  guidance. Performed at [facility], [address], [place], [place] [address]   . Magnesium 04/21/2020 2.1   1.7 - 2.4 mg/dL Final   Performed at [facility], [address], [place], [place] [address]  . HIV Screen 4th Generation wRfx 04/21/2020 Non Reactive  Non Reactive Final   Performed at [facility], [address], [place], [place] [address]  . WBC 04/22/2020 5.3  4.0 - 10.5 K/uL Final  . RBC 04/22/2020 4.72  4.22 - 5.81 MIL/uL Final  . Hemoglobin 04/22/2020 13.0  13.0 - 17.0 g/dL Final  . HCT 95/28/4132 40.8  39 - 52 % Final  . MCV 04/22/2020 86.4  80.0 - 100.0 fL Final  . MCH 04/22/2020 27.5  26.0 - 34.0 pg Final  . MCHC 04/22/2020 31.9  30.0 - 36.0 g/dL Final  . RDW 44/09/270 14.1  11.5 - 15.5 % Final  . Platelets 04/22/2020 226  150 - 400 K/uL Final  . nRBC 04/22/2020 0.0  0.0 - 0.2 % Final   Performed at [facility], [address], [place], [place] [address]  . Sodium 04/22/2020 140  135 - 145 mmol/L Final  . Potassium 04/22/2020 3.7  3.5 - 5.1 mmol/L Final  . Chloride 04/22/2020 105  98 - 111 mmol/L Final  . CO2 04/22/2020 28  22 - 32 mmol/L Final  . Glucose, Bld 04/22/2020 104* 70 - 99 mg/dL Final   Glucose reference range applies only to samples taken after fasting for at least 8 hours.  . BUN 04/22/2020 10  6 - 20 mg/dL Final  . Creatinine, Ser 04/22/2020 0.73  0.61 - 1.24 mg/dL Final  . Calcium 40/34/7425 8.4* 8.9 - 10.3 mg/dL Final  . Total Protein 04/22/2020 6.5  6.5 - 8.1 g/dL Final  . Albumin 95/63/8756 3.5  3.5 - 5.0 g/dL Final  . AST 43/32/9518 17  15 - 41 U/L Final  . ALT 04/22/2020 17  0 - 44 U/L Final  . Alkaline Phosphatase 04/22/2020 82  38 - 126 U/L Final  . Total Bilirubin 04/22/2020 0.7  0.3 - 1.2 mg/dL Final  . GFR calc non Af Amer 04/22/2020 >60  >60 mL/min Final  .  GFR calc Af Amer 04/22/2020 >60  >60 mL/min Final  . Anion gap 04/22/2020 7  5 - 15 Final   Performed at [facility], [address] [place], [place] [address]  . Magnesium 04/22/2020 2.3  1.7 - 2.4 mg/dL Final   Performed  at [facility], [address] [place], [place] [address]  . Phosphorus 04/22/2020 3.5  2.5 - 4.6 mg/dL Final   Performed at [facility], [address] [place], [place] [address]  . WBC 04/23/2020 4.7  4.0 - 10.5 K/uL Final  . RBC 04/23/2020 5.07  4.22 - 5.81 MIL/uL Final  . Hemoglobin 04/23/2020 13.9  13.0 - 17.0 g/dL Final  . HCT 21/30/8657 44.3  39 - 52 % Final  . MCV 04/23/2020 87.4  80.0 - 100.0 fL Final  . MCH 04/23/2020 27.4  26.0 - 34.0 pg Final  . MCHC 04/23/2020 31.4  30.0 - 36.0 g/dL Final  . RDW 84/69/6295 14.0  11.5 - 15.5 % Final  . Platelets 04/23/2020 225  150 - 400 K/uL Final  . nRBC 04/23/2020 0.0  0.0 - 0.2 % Final   Performed at [facility], [address], [place], [place] [address]  . Sodium 04/23/2020 139  135 - 145 mmol/L Final  . Potassium 04/23/2020 3.9  3.5 - 5.1 mmol/L Final  . Chloride 04/23/2020 105  98 - 111 mmol/L Final  . CO2 04/23/2020 24  22 - 32 mmol/L Final  . Glucose, Bld 04/23/2020 101* 70 - 99 mg/dL Final   Glucose reference range applies only to samples taken after fasting for at least 8 hours.  . BUN 04/23/2020 9  6 - 20 mg/dL Final  . Creatinine, Ser 04/23/2020 0.60* 0.61 - 1.24 mg/dL Final  . Calcium 24/40/1027 8.4* 8.9 - 10.3 mg/dL Final  . GFR calc non Af Amer 04/23/2020 >60  >60 mL/min Final  . GFR calc Af Amer 04/23/2020 >60  >60 mL/min Final  . Anion gap 04/23/2020 10  5 - 15 Final   Performed at [facility], [address] [place], [place] [address]  . Magnesium 04/23/2020 2.2  1.7 - 2.4 mg/dL Final   Performed at [facility], [address] [place], [place] [address]  . Phosphorus 04/23/2020 3.2  2.5 - 4.6 mg/dL Final   Performed at [facility], [address] [place], [place] [address]    Allergies: Patient has no known allergies.  PTA Medications: (Not in a hospital admission)   Medical Decision Making   Patient was medically cleared in the emergency department Reviewed labs and notes  Monitor CIWA  Potassium 20 mEq oral x 1 dose for hypokalemia  Restart citalopram 10 mg daily for depression Restart norvasc 5 mg daily for HTN Start seroquel 50 mg QHS for auditory hallucinations    Recommendations  Based on my evaluation the patient does not appear to have an emergency medical condition.   Patient will be placed in the continuous assessment area at [facility] for treatment and stabilization. He will be reevaluated on 06/19/2020. The treatment team will determine disposition at that time.      Jackelyn Poling, NP 06/19/20  5:36 AM

## 2020-06-19 NOTE — ED Notes (Signed)
Pt resting at present, no distress noted, calm & cooperative.  Monitoring for safety. 

## 2020-06-19 NOTE — ED Notes (Signed)
Patient resting in bed with eyes closed. Respirations even and non labored. No distress noted. 

## 2020-06-19 NOTE — ED Notes (Signed)
Patient resting in bed with eyes closed. Respirations even and non labored. No distress noted. Monitoring continues. 

## 2020-06-20 ENCOUNTER — Encounter (HOSPITAL_COMMUNITY): Payer: Self-pay | Admitting: Psychiatry

## 2020-06-20 ENCOUNTER — Inpatient Hospital Stay (HOSPITAL_COMMUNITY)
Admission: AD | Admit: 2020-06-20 | Discharge: 2020-06-27 | DRG: 885 | Disposition: A | Discharge status: Rehabilitation Facility | Payer: Medicare Other | Source: Other Acute Inpatient Hospital | Attending: Psychiatry | Admitting: Psychiatry

## 2020-06-20 DIAGNOSIS — R45851 Suicidal ideations: Secondary | ICD-10-CM | POA: Diagnosis present

## 2020-06-20 DIAGNOSIS — I10 Essential (primary) hypertension: Secondary | ICD-10-CM | POA: Diagnosis present

## 2020-06-20 DIAGNOSIS — Z9152 Personal history of nonsuicidal self-harm: Secondary | ICD-10-CM | POA: Diagnosis not present

## 2020-06-20 DIAGNOSIS — Z5900 Homelessness unspecified: Secondary | ICD-10-CM

## 2020-06-20 DIAGNOSIS — I4891 Unspecified atrial fibrillation: Secondary | ICD-10-CM | POA: Diagnosis present

## 2020-06-20 DIAGNOSIS — F1410 Cocaine abuse, uncomplicated: Secondary | ICD-10-CM

## 2020-06-20 DIAGNOSIS — F410 Panic disorder [episodic paroxysmal anxiety] without agoraphobia: Secondary | ICD-10-CM | POA: Diagnosis present

## 2020-06-20 DIAGNOSIS — E876 Hypokalemia: Secondary | ICD-10-CM | POA: Diagnosis not present

## 2020-06-20 DIAGNOSIS — F323 Major depressive disorder, single episode, severe with psychotic features: Secondary | ICD-10-CM | POA: Diagnosis present

## 2020-06-20 DIAGNOSIS — F319 Bipolar disorder, unspecified: Secondary | ICD-10-CM | POA: Diagnosis present

## 2020-06-20 DIAGNOSIS — Z820 Family history of epilepsy and other diseases of the nervous system: Secondary | ICD-10-CM

## 2020-06-20 DIAGNOSIS — F1014 Alcohol abuse with alcohol-induced mood disorder: Secondary | ICD-10-CM

## 2020-06-20 DIAGNOSIS — Z811 Family history of alcohol abuse and dependence: Secondary | ICD-10-CM | POA: Diagnosis not present

## 2020-06-20 DIAGNOSIS — K5900 Constipation, unspecified: Secondary | ICD-10-CM | POA: Diagnosis present

## 2020-06-20 DIAGNOSIS — F1024 Alcohol dependence with alcohol-induced mood disorder: Secondary | ICD-10-CM | POA: Diagnosis present

## 2020-06-20 DIAGNOSIS — F1424 Cocaine dependence with cocaine-induced mood disorder: Secondary | ICD-10-CM

## 2020-06-20 DIAGNOSIS — Z6841 Body Mass Index (BMI) 40.0 and over, adult: Secondary | ICD-10-CM | POA: Diagnosis not present

## 2020-06-20 DIAGNOSIS — E6609 Other obesity due to excess calories: Secondary | ICD-10-CM | POA: Diagnosis present

## 2020-06-20 DIAGNOSIS — F1590 Other stimulant use, unspecified, uncomplicated: Secondary | ICD-10-CM | POA: Diagnosis present

## 2020-06-20 DIAGNOSIS — Z23 Encounter for immunization: Secondary | ICD-10-CM | POA: Diagnosis present

## 2020-06-20 DIAGNOSIS — Z6379 Other stressful life events affecting family and household: Secondary | ICD-10-CM | POA: Diagnosis not present

## 2020-06-20 DIAGNOSIS — F4310 Post-traumatic stress disorder, unspecified: Secondary | ICD-10-CM

## 2020-06-20 DIAGNOSIS — G4700 Insomnia, unspecified: Secondary | ICD-10-CM | POA: Diagnosis present

## 2020-06-20 DIAGNOSIS — F1020 Alcohol dependence, uncomplicated: Secondary | ICD-10-CM | POA: Diagnosis not present

## 2020-06-20 DIAGNOSIS — Z20822 Contact with and (suspected) exposure to covid-19: Secondary | ICD-10-CM | POA: Diagnosis present

## 2020-06-20 DIAGNOSIS — F333 Major depressive disorder, recurrent, severe with psychotic symptoms: Secondary | ICD-10-CM | POA: Diagnosis present

## 2020-06-20 DIAGNOSIS — Z8249 Family history of ischemic heart disease and other diseases of the circulatory system: Secondary | ICD-10-CM

## 2020-06-20 HISTORY — DX: Obesity, class 2: E66.812

## 2020-06-20 HISTORY — DX: Alcohol dependence with withdrawal, unspecified: F10.239

## 2020-06-20 HISTORY — DX: Cocaine abuse, uncomplicated: F14.10

## 2020-06-20 HISTORY — DX: Obesity, unspecified: E66.9

## 2020-06-20 MED ORDER — THIAMINE HCL 100 MG/ML IJ SOLN
100.0000 mg | Freq: Once | INTRAMUSCULAR | Status: AC
  Administered 2020-06-20: 100 mg via INTRAMUSCULAR
  Filled 2020-06-20: qty 2

## 2020-06-20 MED ORDER — THIAMINE HCL 100 MG PO TABS
100.0000 mg | ORAL_TABLET | Freq: Every day | ORAL | Status: DC
  Administered 2020-06-21 – 2020-06-26 (×6): 100 mg via ORAL
  Filled 2020-06-20 (×11): qty 1

## 2020-06-20 MED ORDER — QUETIAPINE FUMARATE 200 MG PO TABS
200.0000 mg | ORAL_TABLET | Freq: Every day | ORAL | Status: DC
  Administered 2020-06-20: 200 mg via ORAL
  Filled 2020-06-20 (×2): qty 1

## 2020-06-20 MED ORDER — INFLUENZA VAC SPLIT QUAD 0.5 ML IM SUSY
0.5000 mL | PREFILLED_SYRINGE | INTRAMUSCULAR | Status: AC
  Administered 2020-06-21: 0.5 mL via INTRAMUSCULAR
  Filled 2020-06-20: qty 0.5

## 2020-06-20 MED ORDER — PRAZOSIN HCL 1 MG PO CAPS
1.0000 mg | ORAL_CAPSULE | Freq: Every day | ORAL | Status: DC
  Administered 2020-06-20 – 2020-06-26 (×7): 1 mg via ORAL
  Filled 2020-06-20 (×8): qty 1

## 2020-06-20 MED ORDER — HYDROXYZINE HCL 25 MG PO TABS
25.0000 mg | ORAL_TABLET | Freq: Three times a day (TID) | ORAL | Status: DC | PRN
  Administered 2020-06-20: 25 mg via ORAL
  Filled 2020-06-20: qty 1

## 2020-06-20 MED ORDER — ONDANSETRON 4 MG PO TBDP: 4.0000 mg | ORAL_TABLET | Freq: Four times a day (QID) | ORAL | Status: AC | PRN

## 2020-06-20 MED ORDER — POTASSIUM CHLORIDE CRYS ER 20 MEQ PO TBCR: 20.0000 meq | EXTENDED_RELEASE_TABLET | Freq: Two times a day (BID) | ORAL | Status: DC

## 2020-06-20 MED ORDER — TRAZODONE HCL 100 MG PO TABS: 100.0000 mg | ORAL_TABLET | Freq: Every evening | ORAL | Status: DC | PRN

## 2020-06-20 MED ORDER — TRAZODONE HCL 50 MG PO TABS: 50.0000 mg | ORAL_TABLET | Freq: Every evening | ORAL | Status: DC | PRN

## 2020-06-20 MED ORDER — LORAZEPAM 1 MG PO TABS
1.0000 mg | ORAL_TABLET | Freq: Four times a day (QID) | ORAL | Status: AC | PRN
  Administered 2020-06-22: 1 mg via ORAL
  Filled 2020-06-20: qty 1

## 2020-06-20 MED ORDER — LOPERAMIDE HCL 2 MG PO CAPS: 2.0000 mg | ORAL_CAPSULE | ORAL | Status: AC | PRN

## 2020-06-20 MED ORDER — CITALOPRAM HYDROBROMIDE 10 MG PO TABS
10.0000 mg | ORAL_TABLET | Freq: Every day | ORAL | Status: DC
  Administered 2020-06-20 – 2020-06-21 (×2): 10 mg via ORAL
  Filled 2020-06-20 (×3): qty 1

## 2020-06-20 MED ORDER — ALUM & MAG HYDROXIDE-SIMETH 200-200-20 MG/5ML PO SUSP: 30.0000 mL | ORAL | Status: DC | PRN

## 2020-06-20 MED ORDER — ACETAMINOPHEN 325 MG PO TABS
650.0000 mg | ORAL_TABLET | Freq: Four times a day (QID) | ORAL | Status: DC | PRN
  Administered 2020-06-20 – 2020-06-25 (×5): 650 mg via ORAL
  Filled 2020-06-20 (×5): qty 2

## 2020-06-20 MED ORDER — AMLODIPINE BESYLATE 5 MG PO TABS
5.0000 mg | ORAL_TABLET | Freq: Every day | ORAL | Status: DC
  Administered 2020-06-20 – 2020-06-26 (×7): 5 mg via ORAL
  Filled 2020-06-20 (×10): qty 1

## 2020-06-20 MED ORDER — MAGNESIUM HYDROXIDE 400 MG/5ML PO SUSP: 30.0000 mL | Freq: Every day | ORAL | Status: DC | PRN

## 2020-06-20 MED ORDER — HYDROXYZINE HCL 25 MG PO TABS
25.0000 mg | ORAL_TABLET | Freq: Four times a day (QID) | ORAL | Status: AC | PRN
  Administered 2020-06-22: 25 mg via ORAL
  Filled 2020-06-20: qty 1

## 2020-06-20 MED ORDER — PNEUMOCOCCAL VAC POLYVALENT 25 MCG/0.5ML IJ INJ
0.5000 mL | INJECTION | INTRAMUSCULAR | Status: AC
  Administered 2020-06-21: 0.5 mL via INTRAMUSCULAR
  Filled 2020-06-20: qty 0.5

## 2020-06-20 MED ORDER — ADULT MULTIVITAMIN W/MINERALS CH
1.0000 | ORAL_TABLET | Freq: Every day | ORAL | Status: DC
  Administered 2020-06-20 – 2020-06-26 (×7): 1 via ORAL
  Filled 2020-06-20 (×11): qty 1

## 2020-06-20 MED ORDER — POTASSIUM CHLORIDE CRYS ER 20 MEQ PO TBCR
20.0000 meq | EXTENDED_RELEASE_TABLET | Freq: Two times a day (BID) | ORAL | Status: AC
  Administered 2020-06-20 (×2): 20 meq via ORAL
  Filled 2020-06-20 (×3): qty 1

## 2020-06-20 NOTE — [facility] Counselor (Signed)
CSW attempted to meet with Pt for PSA completion. Pt ws sleeping soundly, snoring and unable to be awakened.   [name]

## 2020-06-20 NOTE — Progress Notes (Signed)
   06/20/20 2209  Psych Admission Type (Psych Patients Only)  Admission Status Voluntary  Psychosocial Assessment  Patient Complaints Depression;Anxiety  Eye Contact Fair  Facial Expression Anxious  Affect Depressed  Speech Logical/coherent  Interaction Assertive  Motor Activity Slow  Appearance/Hygiene Disheveled;In hospital gown  Behavior Characteristics Guarded;Cooperative  Mood Depressed;Sad  Thought Process  Coherency WDL  Content WDL  Delusions None reported or observed  Perception WDL  Hallucination None reported or observed  Judgment Poor  Confusion None  Danger to Self  Current suicidal ideation? Denies  Self-Injurious Behavior No self-injurious ideation or behavior indicators observed or expressed   Agreement Not to Harm Self Yes  Description of Agreement  (verbally contract accepted )  Danger to Others  Danger to Others None reported or observed  D: Patient reports he is tolerating medication well. Pt endorses passive SI but verbally contract to come to staff. Pt endorses AV without command. A: Medications administered as prescribed. Support and encouragement provided as needed.  R: Patient remains safe on the unit. Will continue to monitor for safety and stability.

## 2020-06-20 NOTE — Plan of Care (Signed)
Patient spent the morning in bed reporting that he needed some rest. Patient was more visible in the milieu toward the afternoon. He appears to be tired. Denies SI?HI/AVH. No significant withdrawal symptoms noted.

## 2020-06-20 NOTE — [facility] Group Notes (Signed)
[facility] LCSW Group Therapy  06/20/2020 2:51 PM  Type of Therapy:  Coping Skills  Participation Level:  Did Not Attend   Summary of Progress/Problems: This patient was invited to attend group, however this patient chose not to attend.    [name] A [name] 06/20/2020, 2:51 PM  

## 2020-06-20 NOTE — Progress Notes (Signed)
[facility] Group Notes:  (Nursing/MHT/Case Management/Adjunct)  Date:  06/20/2020  Time:  2030  Type of Therapy:  wrap up group  Participation Level:  Active  Participation Quality:  Appropriate, Attentive, Sharing and Supportive  Affect:  Blunted  Cognitive:  Appropriate  Insight:  Improving  Engagement in Group:  Engaged  Modes of Intervention:  Clarification, Education and Support  Summary of Progress/Problems: Positive thinking and positive change were discussed.   Marcille Buffy 06/20/2020, 9:24 PM
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
## 2020-06-20 NOTE — Tx Team (Cosign Needed)
Interdisciplinary Treatment and Diagnostic Plan Update  06/20/2020 Time of Session: [id number] [name] MRN: [id number]  Principal Diagnosis: MDD (major depressive disorder), recurrent, severe, with psychosis ([place])  Secondary Diagnoses: Principal Problem:   MDD (major depressive disorder), recurrent, severe, with psychosis ([place]) Active Problems:   Hypertension   Post traumatic stress disorder (PTSD)   Cocaine dependence with cocaine-induced mood disorder ([place])   Alcohol abuse with alcohol-induced mood disorder ([place])   Cocaine abuse ([place])   Class 2 obesity due to excess calories with body mass index (BMI) of 39.0 to 39.9 in adult   Bipolar 1 disorder ([place])   Current Medications:  Current Facility-Administered Medications  Medication Dose Route Frequency Provider Last Rate Last Admin  . acetaminophen (TYLENOL) tablet 650 mg  650 mg Oral Q6H PRN Lindon Romp A, NP   650 mg at 06/20/20 0609  . alum & mag hydroxide-simeth (MAALOX/MYLANTA) 200-200-20 MG/5ML suspension 30 mL  30 mL Oral Q4H PRN Lindon Romp A, NP      . amLODipine (NORVASC) tablet 5 mg  5 mg Oral Daily Lindon Romp A, NP   5 mg at 06/20/20 0840  . citalopram (CELEXA) tablet 10 mg  10 mg Oral Daily Lindon Romp A, NP   10 mg at 06/20/20 0840  . hydrOXYzine (ATARAX/VISTARIL) tablet 25 mg  25 mg Oral Q6H PRN Dagar, Meredith Staggers, MD      . Derrill Memo ON 06/21/2020] influenza vac split quadrivalent PF (FLUARIX) injection 0.5 mL  0.5 mL Intramuscular Tomorrow-1000 Lindon Romp A, NP      . loperamide (IMODIUM) capsule 2-4 mg  2-4 mg Oral PRN Dagar, Meredith Staggers, MD      . LORazepam (ATIVAN) tablet 1 mg  1 mg Oral Q6H PRN Dagar, Meredith Staggers, MD      . magnesium hydroxide (MILK OF MAGNESIA) suspension 30 mL  30 mL Oral Daily PRN Lindon Romp A, NP      . multivitamin with minerals tablet 1 tablet  1 tablet Oral Daily Dagar, Anjali, MD   1 tablet at 06/20/20 1122  . ondansetron (ZOFRAN-ODT) disintegrating tablet 4 mg  4 mg Oral Q6H PRN Dagar, Meredith Staggers,  MD      . Derrill Memo ON 06/21/2020] pneumococcal 23 valent vaccine (PNEUMOVAX-23) injection 0.5 mL  0.5 mL Intramuscular Tomorrow-1000 Lindon Romp A, NP      . potassium chloride SA (KLOR-CON) CR tablet 20 mEq  20 mEq Oral BID Dagar, Meredith Staggers, MD   20 mEq at 06/20/20 0840  . prazosin (MINIPRESS) capsule 1 mg  1 mg Oral QHS Dagar, Anjali, MD      . QUEtiapine (SEROQUEL) tablet 200 mg  200 mg Oral QHS Lindon Romp A, NP      . Derrill Memo ON 06/21/2020] thiamine tablet 100 mg  100 mg Oral Daily Dagar, Anjali, MD       PTA Medications: Medications Prior to Admission  Medication Sig Dispense Refill Last Dose  . amLODipine (NORVASC) 5 MG tablet Take 1 tablet (5 mg total) by mouth daily. (Patient not taking: Reported on 06/18/2020) 30 tablet 11     Patient Stressors: Financial difficulties Marital or family conflict  Patient Strengths: Active sense of humor Communication skills Motivation for treatment/growth  Treatment Modalities: Medication Management, Group therapy, Case management,  1 to 1 session with clinician, Psychoeducation, Recreational therapy.   Physician Treatment Plan for Primary Diagnosis: MDD (major depressive disorder), recurrent, severe, with psychosis ([place]) Long Term Goal(s): Improvement in symptoms so as ready for discharge Improvement in symptoms  so as ready for discharge   Short Term Goals: Ability to disclose and discuss suicidal ideas Ability to demonstrate self-control will improve Compliance with prescribed medications will improve Ability to identify changes in lifestyle to reduce recurrence of condition will improve Ability to disclose and discuss suicidal ideas Ability to demonstrate self-control will improve Ability to identify triggers associated with substance abuse/mental health issues will improve  Medication Management: Evaluate patient's response, side effects, and tolerance of medication regimen.  Therapeutic Interventions: 1 to 1 sessions, Unit Group  sessions and Medication administration.  Evaluation of Outcomes: Not Met  Physician Treatment Plan for Secondary Diagnosis: Principal Problem:   MDD (major depressive disorder), recurrent, severe, with psychosis ([place]) Active Problems:   Hypertension   Post traumatic stress disorder (PTSD)   Cocaine dependence with cocaine-induced mood disorder ([place])   Alcohol abuse with alcohol-induced mood disorder (HCC)   Cocaine abuse ([place])   Class 2 obesity due to excess calories with body mass index (BMI) of 39.0 to 39.9 in adult   Bipolar 1 disorder ([place])  Long Term Goal(s): Improvement in symptoms so as ready for discharge Improvement in symptoms so as ready for discharge   Short Term Goals: Ability to disclose and discuss suicidal ideas Ability to demonstrate self-control will improve Compliance with prescribed medications will improve Ability to identify changes in lifestyle to reduce recurrence of condition will improve Ability to disclose and discuss suicidal ideas Ability to demonstrate self-control will improve Ability to identify triggers associated with substance abuse/mental health issues will improve     Medication Management: Evaluate patient's response, side effects, and tolerance of medication regimen.  Therapeutic Interventions: 1 to 1 sessions, Unit Group sessions and Medication administration.  Evaluation of Outcomes: Not Met   RN Treatment Plan for Primary Diagnosis: MDD (major depressive disorder), recurrent, severe, with psychosis ([place]) Long Term Goal(s): Knowledge of disease and therapeutic regimen to maintain health will improve  Short Term Goals: Ability to remain free from injury will improve, Ability to verbalize frustration and anger appropriately will improve, Ability to demonstrate self-control, Ability to identify and develop effective coping behaviors will improve and Compliance with prescribed medications will improve  Medication Management: RN will  administer medications as ordered by provider, will assess and evaluate patient's response and provide education to patient for prescribed medication. RN will report any adverse and/or side effects to prescribing provider.  Therapeutic Interventions: 1 on 1 counseling sessions, Psychoeducation, Medication administration, Evaluate responses to treatment, Monitor vital signs and CBGs as ordered, Perform/monitor CIWA, COWS, AIMS and Fall Risk screenings as ordered, Perform wound care treatments as ordered.  Evaluation of Outcomes: Not Met   LCSW Treatment Plan for Primary Diagnosis: MDD (major depressive disorder), recurrent, severe, with psychosis ([place]) Long Term Goal(s): Safe transition to appropriate next level of care at discharge, Engage patient in therapeutic group addressing interpersonal concerns.  Short Term Goals: Engage patient in aftercare planning with referrals and resources, Increase social support, Increase ability to appropriately verbalize feelings, Increase emotional regulation and Increase skills for wellness and recovery  Therapeutic Interventions: Assess for all discharge needs, 1 to 1 time with Social worker, Explore available resources and support systems, Assess for adequacy in community support network, Educate family and significant other(s) on suicide prevention, Complete Psychosocial Assessment, Interpersonal group therapy.  Evaluation of Outcomes: Not Met   Progress in Treatment: Attending groups: No. Participating in groups: No. Taking medication as prescribed: Yes. Toleration medication: Yes. Family/Significant other contact made: No, will contact:  SW will  assess with Pt during PSA completion. Patient understands diagnosis: Yes. Discussing patient identified problems/goals with staff: Yes. Medical problems stabilized or resolved: Yes. Denies suicidal/homicidal ideation: Yes. Issues/concerns per patient self-inventory: No. Other: None  New problem(s)  identified: Yes, Describe:  Pt admitted for non-specfic SI and anxiety. Pt recently discharged from medical hospital for alcohol detox.   New Short Term/Long Term Goal(s): SW will provide Pt with community based resources to aid in substance abuse recovery and relapse prevention.  Patient Goals: "To get stabilized on the medications and find other treatment outside of here"  Discharge Plan or Barriers: SW will continue to assess.  Reason for Continuation of Hospitalization: Anxiety Depression Medication stabilization Suicidal ideation  Estimated Length of Stay: 3-5 Days  Attendees: Patient: [name] 06/20/2020 1:41 PM  Physician: Harriett Sine, NP 06/20/2020 1:41 PM  Nursing:  06/20/2020 1:41 PM  RN Care Manager: 06/20/2020 1:41 PM  Social Worker: Freddi Che, LCSW 06/20/2020 1:41 PM  Recreational Therapist:  06/20/2020 1:41 PM  Other:  06/20/2020 1:41 PM  Other:  06/20/2020 1:41 PM  Other: 06/20/2020 1:41 PM    Scribe for Treatment Team: Freddi Che, LCSW 06/20/2020 1:41 PM

## 2020-06-20 NOTE — ED Notes (Signed)
Report given to RN Mariam, [facility] rm [phone]  Pending Safe transport.
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
## 2020-06-20 NOTE — Progress Notes (Signed)
Pt is a 49 y.o. voluntarily committed male presenting to [facility] from [facility] for worsening suicidal thoughts, depression, and SI. Pt reports that he has been having "severe suicidal thoughts since 01/03/18." He said they started in March or 2018/01/03 and that he still has them to this day. He endorses AH that tell him to go back to cutting that occur intermittently, 3-7 times a day. Reports past suicide attempts and a hx of cutting with a knife when he was 89 and 49 years old. Pt also endorses VH of "strange shapes, images, and zig zags" that appear more when he is in a dark room. He sees them mostly at bedtime and it comes flashing to him from the side or front of his visual field. Reports that he is still grieving over the loss of his mother that passed away in Jan 04, 2015. One of his main stressors is that his father used to be his payee and that he used to get him his medications. But that stopped in 2018-01-03 which he said may be due to his dementia. But then he mentioned that they were having some conflict, he didn't like how his father had been managing his money. He was no longer able to go to his PCP appointments either. Pt said that this has made him upset because then his father told him to "just sit there and deal with it" when he was really anxious or having a panic attack. His father said that if he decided to go to the ER, not to call him to come pick him up. He was staying with his father in an apartment but got kicked out by him last Wednesday and he doesn't want this pt back. Pt is now homeless.   Pt has multiple scars on his bilateral arms, some to his forehead, left upper chest, and left knee. Reports taking klonopin, lisinopril, and Celexa in the past.  But reports no access to medications lately. Reports drinking 4 days out of the week, 4-24 ounces of alcohol daily. Last drink was last Wednesday morning and he had 4-24 ounces of liquor. Denies tobacco use. Reports cocaine use for the first time since Jan 04, 2015 last  Wednesday where he had a "minimal" amount. Pt has a hx of atrial fibrillation and heart palpitations due to anxiety. Reports having instances 2-3 times a month where he may pass out or lose his balance due to his atrial fibrillation. He said that it can be exacerbated by his anxiety and panic attacks. Reports 2 falls last Friday and has been placed on high fall risk prevention protocol. His support person is his brother. His goals are to get on the "right meds" and attend intensive outpatient treatment upon discharge. He is worried about his living situation since he is homeless now and would like to discuss that with the treatment team.   Pt verbally contracts for safety, agrees to notify staff immediately for any thoughts of harming himself or anyone else. Unit rules/policies discussed. Pts bill of rights provided. Consents discussed and signed. Belongings allowed on the unit and contraband discussed with pt. Belongings not allowed on the unit secured in assigned locker. Skin assessment completed. Food/fluids offered and accepted. Unit tour provided. Active listening, reassurance, and support provided. Q 15 min safety checks initiated. Pt's safety has been maintained.

## 2020-06-20 NOTE — Tx Team (Signed)
Initial Treatment Plan 06/20/2020 3:22 AM Aggie Hacker HER:[id number]    PATIENT STRESSORS: Financial difficulties Marital or family conflict   PATIENT STRENGTHS: Active sense of humor Communication skills Motivation for treatment/growth   PATIENT IDENTIFIED PROBLEMS: Anxiety  dpression  "Out patient intensive care"  'Be on the right "               DISCHARGE CRITERIA:  Ability to meet basic life and health needs Adequate post-discharge living arrangements Improved stabilization in mood, thinking, and/or behavior  PRELIMINARY DISCHARGE PLAN: Attend aftercare/continuing care group Attend PHP/IOP Attend 12-step recovery group  PATIENT/FAMILY INVOLVEMENT: This treatment plan has been presented to and reviewed with the patient, MICHELANGELO RINDFLEISCH, and/or family member.  The patient and family have been given the opportunity to ask questions and make suggestions.  Bethann Punches, RN 06/20/2020, 3:22 AM

## 2020-06-20 NOTE — Discharge Instructions (Addendum)
Transfer to [facility] 

## 2020-06-20 NOTE — Progress Notes (Signed)
Nutrition Brief Note  Patient identified on the Malnutrition Screening Tool (MST) Report  No weight loss noted in weight records.  Wt Readings from Last 15 Encounters:  06/20/20 126.1 kg  06/19/20 112.5 kg  06/17/20 111.1 kg  06/14/20 111.1 kg  04/21/20 111.1 kg  10/10/19 104.3 kg  08/08/19 111.1 kg  06/29/17 108 kg  03/10/17 97.5 kg  11/28/16 113.4 kg  10/31/16 106.6 kg  06/04/16 113.9 kg  04/23/15 114.2 kg  04/21/15 115.9 kg  04/12/15 115.7 kg    Body mass index is 43.54 kg/m. Patient meets criteria for morbid obesity based on current BMI.   Labs and medications reviewed.   No nutrition interventions warranted at this time. If nutrition issues arise, please consult RD.   Tilda Franco, MS, RD, LDN Inpatient Clinical Dietitian Contact information available via Amion

## 2020-06-21 LAB — HEMOGLOBIN A1C
Hgb A1c MFr Bld: 6 % — ABNORMAL HIGH (ref 4.8–5.6)
Mean Plasma Glucose: 126 mg/dL

## 2020-06-21 MED ORDER — CITALOPRAM HYDROBROMIDE 10 MG PO TABS
10.0000 mg | ORAL_TABLET | Freq: Once | ORAL | Status: AC
  Administered 2020-06-21: 10 mg via ORAL
  Filled 2020-06-21: qty 1

## 2020-06-21 MED ORDER — QUETIAPINE FUMARATE 300 MG PO TABS
300.0000 mg | ORAL_TABLET | Freq: Every day | ORAL | Status: DC
  Administered 2020-06-21 – 2020-06-26 (×6): 300 mg via ORAL
  Filled 2020-06-21 (×7): qty 1

## 2020-06-21 MED ORDER — CITALOPRAM HYDROBROMIDE 20 MG PO TABS
20.0000 mg | ORAL_TABLET | Freq: Every day | ORAL | Status: DC
  Administered 2020-06-22 – 2020-06-26 (×5): 20 mg via ORAL
  Filled 2020-06-21 (×8): qty 1

## 2020-06-21 MED ORDER — CITALOPRAM HYDROBROMIDE 10 MG PO TABS: 10.0000 mg | ORAL_TABLET | Freq: Every day | ORAL | Status: DC

## 2020-06-21 NOTE — [facility] Group Notes (Signed)
The focus of this group is to help patients establish daily goals to achieve during treatment and discuss how the patient can incorporate goal setting into their daily lives to aide in recovery.  Pt did not attend group 
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
## 2020-06-21 NOTE — Progress Notes (Signed)
Pt observed in bed on initial approach. Presents with flat affect, depressed mood and fair eye contact. Endorsed AH and passive SI with intrusive thoughts to harm self "I'm still suicidal, I hear voices telling me to cut myself like I use to do when I was 32-48 years old". Rates his anxiety 8/10 and depression 7/10 with stressor being "finding the best solution to continue treatment when I d/c from here".  Observed in bed for majority of this shift. Pt did not attend scheduled groups or off unit activities despite multiple prompts "I'm just tired. I think its my medications".  Took his medications as ordered.  Emotional support and encouragement offered. All medications given as ordered. Q 15 minutes safety checks maintained without incident. Pt tolerates all PO intake well. Q 15 minutes safety checks maintained without self harm gestures or outburst thus far.

## 2020-06-21 NOTE — BHH Counselor (Signed)
DISCHARGE PLANNING:   CSW faxed Pt referral for substance abuse treatment to the following facilities:   [facility]  [facility]   CSW to follow-up after fax confirmation is received.    Jacinta Shoe, LCSW

## 2020-06-21 NOTE — BHH Group Notes (Signed)
Occupational Therapy Group Note Date: 06/21/2020 Group Topic/Focus: Communication Skills  Group Description: Group encouraged increased engagement and participation through discussion focused on identifying and 'breaking down barriers." Group members were encouraged to complete a worksheet and engage in discussion identifying barriers that get in the way of seeking treatment, asking for help, and managing our mental health.   Participation Level: Patient did not attend OT group session.  Plan: Continue to engage patient in OT groups 2 - 3x/week.  06/21/2020  Revin Corker, MOT, OTR/L 

## 2020-06-21 NOTE — Progress Notes (Signed)
[facility] MD Progress Note  06/21/2020 12:04 PM [name]  MRN:  [id number] Subjective:  Patient states " I am these recurrent suicidal thoughts with a plan to cut myself". He admits to auditory hallucinations, last heard with this morning telling him that he is worthless. He also endorses visual hallucinations seeing different shapes and objects, tries to make them go away by blinking but they are there all the time. He states appetite is not good and he ate a part of his breakfast. He states he is trying really hard to work on himself, he went to the group therapy yesterday. He states he did not have any nightmares yesterday and had a good night sleep. He adds this was the best sleep in long time and he woke up only once last night.   Objective: Patient was lying in his bed and followed the provider to the office. He looks depressed, anxious but pleasant and respectful on approach. He is oriented* 3, does not seem like responding to the internal stimuli. Had a long discussion about substance abuse treatment and shown & shared information about [facility] and [facility]. Blood pressure (!) 161/120, pulse (!) 105, temperature 98.4 F (36.9 C), temperature source Oral, resp. rate 20, height 5\' 7"  (1.702 m), weight 126.1 kg, SpO2 96 %.Body mass index is 43.54 kg/m. He slept 6.75 hours last night.   Principal Problem: MDD (major depressive disorder), recurrent, severe, with psychosis (HCC) Diagnosis: Principal Problem:   MDD (major depressive disorder), recurrent, severe, with psychosis (HCC) Active Problems:   Hypertension   Post traumatic stress disorder (PTSD)   Cocaine dependence with cocaine-induced mood disorder (HCC)   Alcohol abuse with alcohol-induced mood disorder (HCC)   Cocaine abuse (HCC)   Class 2 obesity due to excess calories with body mass index (BMI) of 39.0 to 39.9 in adult   Bipolar 1 disorder (HCC)  Total Time spent with patient: 25 minutes  Past Psychiatric History: Patient reports  major depressive disorder with psychosis, Bipolar 1, alcohol dependence, cocaine dependence. He states his last psychiatric hospitalization was in 2016 after he relapsed on alcohol and cocaine due to his mother passing away. He states he was in [facility] for alcohol abuse in 2014. He states he just got admitted to hospital before this psychiatric hospitalization for alcohol withdrawal.   Past Medical History:  Past Medical History:  Diagnosis Date  . Alcohol dependence with withdrawal (HCC)   . Anxiety   . Asthma   . Atrial fibrillation (HCC)    not on AC  . Bipolar 1 disorder (HCC)   . Class 2 obesity   . Cocaine abuse (HCC)   . Depression   . Heart palpitations    with severe anxiety  . Hypertension   . PTSD (post-traumatic stress disorder)   . PTSD (post-traumatic stress disorder)     Past Surgical History:  Procedure Laterality Date  . NO PAST SURGERIES     Family History:  Family History  Problem Relation Age of Onset  . Alcohol abuse Mother   . Hypertension Father   . Dementia Father   . Alcohol abuse Maternal Grandfather    Family Psychiatric  History: Denies history of mental illness.  Social History:  Social History   Substance and Sexual Activity  Alcohol Use Yes   Comment: reports that his last drink was last Wednesday when he had 4- 24oz of liquor, said that 4 days out of the wk he drinks 4-24 ounce beers  Social History   Substance and Sexual Activity  Drug Use Yes  . Types: Cocaine   Comment: last use was "last Wednesday" and said that he uses a "minimal" amount, 1st time he used it since 2016    Social History   Socioeconomic History  . Marital status: Single    Spouse name: Not on file  . Number of children: Not on file  . Years of education: Not on file  . Highest education level: Not on file  Occupational History  . Occupation: disabled  Tobacco Use  . Smoking status: Never Smoker  . Smokeless tobacco: Never Used  Vaping Use  . Vaping  Use: Never used  Substance and Sexual Activity  . Alcohol use: Yes    Comment: reports that his last drink was last Wednesday when he had 4- 24oz of liquor, said that 4 days out of the wk he drinks 4-24 ounce beers  . Drug use: Yes    Types: Cocaine    Comment: last use was "last Wednesday" and said that he uses a "minimal" amount, 1st time he used it since 2016  . Sexual activity: Not Currently    Comment: pt said he has an enlarged prostate  Other Topics Concern  . Not on file  Social History Narrative   ** Merged History Encounter **       Social Determinants of Health   Financial Resource Strain:   . Difficulty of Paying Living Expenses: Not on file  Food Insecurity:   . Worried About Programme researcher, broadcasting/film/videounning Out of Food in the Last Year: Not on file  . Ran Out of Food in the Last Year: Not on file  Transportation Needs:   . Lack of Transportation (Medical): Not on file  . Lack of Transportation (Non-Medical): Not on file  Physical Activity:   . Days of Exercise per Week: Not on file  . Minutes of Exercise per Session: Not on file  Stress:   . Feeling of Stress : Not on file  Social Connections:   . Frequency of Communication with Friends and Family: Not on file  . Frequency of Social Gatherings with Friends and Family: Not on file  . Attends Religious Services: Not on file  . Active Member of Clubs or Organizations: Not on file  . Attends BankerClub or Organization Meetings: Not on file  . Marital Status: Not on file   Additional Social History:                         Sleep: Good  Appetite:  Fair  Current Medications: Current Facility-Administered Medications  Medication Dose Route Frequency Provider Last Rate Last Admin  . acetaminophen (TYLENOL) tablet 650 mg  650 mg Oral Q6H PRN Nira ConnBerry, Jason A, NP   650 mg at 06/20/20 0609  . alum & mag hydroxide-simeth (MAALOX/MYLANTA) 200-200-20 MG/5ML suspension 30 mL  30 mL Oral Q4H PRN Nira ConnBerry, Jason A, NP      . amLODipine (NORVASC)  tablet 5 mg  5 mg Oral Daily Nira ConnBerry, Jason A, NP   5 mg at 06/21/20 0917  . citalopram (CELEXA) tablet 10 mg  10 mg Oral Once Brysyn Brandenberger, Geralynn RileAnjali, MD      . Melene Muller[START ON 06/22/2020] citalopram (CELEXA) tablet 20 mg  20 mg Oral Daily Nassir Neidert, MD      . hydrOXYzine (ATARAX/VISTARIL) tablet 25 mg  25 mg Oral Q6H PRN Adiva Boettner, Geralynn RileAnjali, MD      . influenza  vac split quadrivalent PF (FLUARIX) injection 0.5 mL  0.5 mL Intramuscular Tomorrow-1000 Nira Conn A, NP      . loperamide (IMODIUM) capsule 2-4 mg  2-4 mg Oral PRN Randeep Biondolillo, Geralynn Rile, MD      . LORazepam (ATIVAN) tablet 1 mg  1 mg Oral Q6H PRN Adrinne Sze, Geralynn Rile, MD      . magnesium hydroxide (MILK OF MAGNESIA) suspension 30 mL  30 mL Oral Daily PRN Nira Conn A, NP      . multivitamin with minerals tablet 1 tablet  1 tablet Oral Daily Tobin Cadiente, Geralynn Rile, MD   1 tablet at 06/21/20 0917  . ondansetron (ZOFRAN-ODT) disintegrating tablet 4 mg  4 mg Oral Q6H PRN Zhamir Pirro, Geralynn Rile, MD      . pneumococcal 23 valent vaccine (PNEUMOVAX-23) injection 0.5 mL  0.5 mL Intramuscular Tomorrow-1000 Nira Conn A, NP      . prazosin (MINIPRESS) capsule 1 mg  1 mg Oral QHS Ofilia Rayon, MD   1 mg at 06/20/20 2125  . QUEtiapine (SEROQUEL) tablet 300 mg  300 mg Oral QHS Nikalas Bramel, Geralynn Rile, MD      . thiamine tablet 100 mg  100 mg Oral Daily Alaila Pillard, Geralynn Rile, MD   100 mg at 06/21/20 4163    Lab Results:  Results for orders placed or performed during the hospital encounter of 06/20/20 (from the past 48 hour(s))  Hemoglobin A1c     Status: Abnormal   Collection Time: 06/20/20  6:14 PM  Result Value Ref Range   Hgb A1c MFr Bld 6.0 (H) 4.8 - 5.6 %    Comment: (NOTE)         Prediabetes: 5.7 - 6.4         Diabetes: >6.4         Glycemic control for adults with diabetes: <7.0    Mean Plasma Glucose 126 mg/dL    Comment: (NOTE) Performed At: [facility] [address] [place], [place] [id number] Jolene Schimke MD [id number]     Blood Alcohol level:  Lab Results  Component  Value Date   ETH <10 06/17/2020   ETH 43 (H) 06/14/2020    Metabolic Disorder Labs: Lab Results  Component Value Date   HGBA1C 6.0 (H) 06/20/2020   MPG 126 06/20/2020   No results found for: PROLACTIN Lab Results  Component Value Date   CHOL 130 06/19/2020   TRIG 67 06/19/2020   HDL 27 (L) 06/19/2020   CHOLHDL 4.8 06/19/2020   VLDL 13 06/19/2020   LDLCALC 90 06/19/2020    Physical Findings: AIMS:  , ,  ,  ,    CIWA:  CIWA-Ar Total: 1 COWS:     Musculoskeletal: Strength & Muscle Tone: within normal limits Gait & Station: normal Patient leans: N/A  Psychiatric Specialty Exam: Physical Exam Vitals and nursing note reviewed.  Constitutional:      Appearance: He is obese.  HENT:     Head: Normocephalic and atraumatic.     Nose: Nose normal.  Eyes:     Pupils: Pupils are equal, round, and reactive to light.  Musculoskeletal:     Cervical back: Normal range of motion.  Neurological:     Mental Status: He is alert and oriented to person, place, and time.  Psychiatric:        Attention and Perception: Attention normal. He perceives auditory and visual hallucinations.        Mood and Affect: Mood is anxious and depressed.  Speech: Speech normal.        Behavior: Behavior is cooperative.        Thought Content: Thought content includes suicidal ideation.     Review of Systems  Constitutional: Positive for activity change, appetite change and fatigue.  HENT: Negative.   Respiratory: Negative.   Gastrointestinal: Negative.   Musculoskeletal: Positive for myalgias.  Neurological: Positive for headaches.  Psychiatric/Behavioral: Positive for dysphoric mood, hallucinations and suicidal ideas. The patient is nervous/anxious.     Blood pressure (!) 161/120, pulse (!) 105, temperature 98.4 F (36.9 C), temperature source Oral, resp. rate 20, height 5\' 7"  (1.702 m), weight 126.1 kg, SpO2 96 %.Body mass index is 43.54 kg/m.  General Appearance: Casual  Eye Contact:   Fair  Speech:  Normal Rate  Volume:  Normal  Mood:  Anxious, Dysphoric, Hopeless and Worthless  Affect:  Restricted  Thought Process:  Linear and Descriptions of Associations: Intact  Orientation:  Full (Time, Place, and Person)  Thought Content:  Hallucinations: Auditory Visual and Rumination  Suicidal Thoughts:  Yes.  with intent/plan  Homicidal Thoughts:  No  Memory:  Immediate;   Fair Recent;   Fair Remote;   Fair  Judgement:  Fair  Insight:  Fair  Psychomotor Activity:  Normal  Concentration:  Concentration: Good and Attention Span: Good  Recall:  Fair  Fund of Knowledge:  Good  Language:  Good  Akathisia:  Negative  Handed:  Right  AIMS (if indicated):     Assets:  Desire for Improvement Resilience  ADL's:  Intact  Cognition:  WNL  Sleep:  Number of Hours: 6.75   Assessment: [name] is a 49 yo M who presented voluntarily on 06/18/2020 to [facility] for chest pain, worsening depression and suicidal thoughts with a plan to cut himself. He was discharged on 06/17/2020 from [facility] after ETOH detox. He was admitted to [facility] on 06/20/2020 for ongoing suicidal ideations and depression.   Diagnosis:  1. Major depressive disorder with psychotic features 2. Alcohol dependence 3. Cocaine dependence 4. Hypokalemia 5 Pre-diabetes   Pertinent findings today:  1. Depressed and anxious mood 2. Withdrawal symptoms of high blood pressure, tremors, anxiety 3. Suicidal ideations with a plan to cut himself 4. Auditory hallucinations telling him worthless 5. Visual hallucinations  Treatment Plan Summary: Daily contact with patient to assess and evaluate symptoms and progress in treatment.  Plan :   Scheduled medications:  1.Increase Celexa to 20 mg for depressed mood 2.Increase Seroquel to 300 mg for psychosis. 3.Amlodipine 5 mg for blood pressure control 4. Multivitamin 5. KCl 20 meq PO BID for low potassium 6. Minipress 1 mg for nightmares 7. CIWA    PRN's :  1. Tylenol 650 mg for mild pain 2. Maalox 30 ml for indigestion 3. Vistaril 25 mg 4. Lorazepam 1 mg for CIWA >10.  5. Milk of magnesia 30 ml for mild constipation.  6. Zofran for nausea   Labs:  1. Recent potassium level 3.3 on 06/17/2020 2. Glucose 100, HbA1c 6% 3. UDS positive for BZ's and Cocaine 4. Most recent CIWA was 1  Psychosocial:  1. Encouragement to attend group therapies. 2. Encouragement for medication compliance 3. Appreciate the social work help with placement. 4. Disposition in progress    06/19/2020, MD 06/21/2020, 12:04 PM

## 2020-06-22 NOTE — BHH Counselor (Signed)
FOLLOW-UP:  CSW attempted to follow-up on referrals for residential treatment.   CSW was able to leave VM with [facility]. CSW left VM for Lonni Fix in admission requesting status update.  CSW attempted x2 to reach [facility]. CSW was unable to leave a message.   Jacinta Shoe, LCSW

## 2020-06-22 NOTE — Progress Notes (Signed)
Adult Psychoeducational Group Note  Date:  06/22/2020 Time:  1:42 AM  Group Topic/Focus:  Wrap-Up Group:   The focus of this group is to help patients review their daily goal of treatment and discuss progress on daily workbooks.  Participation Level:  Active  Participation Quality:  Appropriate  Affect:  Appropriate  Cognitive:  Appropriate  Insight: Appropriate  Engagement in Group:  Engaged  Modes of Intervention:  Confrontation  Additional Comments:  Pt attend wrap up group. Pt rate his overall day as 5. The one positive thing that happen to him today he talk to the social worker and he will be able to find a treatment center once he's discharge.  Charna Busman Long 06/22/2020, 1:42 AM

## 2020-06-22 NOTE — Progress Notes (Signed)
[facility]  06/22/2020 11:05 AM [name]  MRN:  [id number]   Subjective:  Patient states " I am not actively thinking about hurting myself today, I don't have plan "" Getting help here and talking to social worker yesterday I feel there is some hope in life". He admits to auditory hallucinations, last heard were yesterday evening but states the frequency is decreasing. He also endorses visual hallucinations seeing different shapes and objects, but states they are decreasing in frequency as well. He states appetite is not good and he ate a part of his breakfast. He states he is trying really hard to work on himself, he is planning to go to group therapies today. He states he did not have any nightmares yesterday and had a fair sleep.   Objective: Patient was lying in his bed and followed the provider to the office. He looks depressed, anxious but pleasant and respectful on approach. He is oriented* 3, does not seem like responding to the internal stimuli. Had a long discussion about substance abuse treatment and shown & shared information about them. Patient attended group therapy in evening and as per note " Pt attend wrap up group. Pt rate his overall day as 5. The one positive thing that happen to him today he talk to the social worker and he will be able to find a treatment center once he's discharge.". Blood pressure (!) 158/112, pulse 87, temperature 98.4 F (36.9 C), temperature source Oral, resp. rate 20, height 5\' 7"  (1.702 m), weight 126.1 kg, SpO2 96 %.Body mass index is 43.54 kg/m. He slept 6.75 hours last night.   Principal Problem: MDD (major depressive disorder), recurrent, severe, with psychosis (HCC) Diagnosis: Principal Problem:   MDD (major depressive disorder), recurrent, severe, with psychosis (HCC) Active Problems:   Hypertension   Post traumatic stress disorder (PTSD)   Cocaine dependence with cocaine-induced mood disorder (HCC)   Alcohol abuse with  alcohol-induced mood disorder (HCC)   Cocaine abuse (HCC)   Class 2 obesity due to excess calories with body mass index (BMI) of 39.0 to 39.9 in adult   Bipolar 1 disorder (HCC)  Total Time spent with patient: 25 minutes  Past Psychiatric History: Patient reports major depressive disorder with psychosis, Bipolar 1, alcohol dependence, cocaine dependence. He states his last psychiatric hospitalization was in 2016 after he relapsed on alcohol and cocaine due to his mother passing away. [facility]. He states he just got admitted to hospital before this psychiatric hospitalization for alcohol withdrawal.   Past Medical History:  Past Medical History:  Diagnosis Date  . Alcohol dependence with withdrawal (HCC)   . Anxiety   . Asthma   . Atrial fibrillation (HCC)    not on AC  . Bipolar 1 disorder (HCC)   . Class 2 obesity   . Cocaine abuse (HCC)   . Depression   . Heart palpitations    with severe anxiety  . Hypertension   . PTSD (post-traumatic stress disorder)   . PTSD (post-traumatic stress disorder)     Past Surgical History:  Procedure Laterality Date  . NO PAST SURGERIES     Family History:  Family History  Problem Relation Age of Onset  . Alcohol abuse Mother   . Hypertension Father   . Dementia Father   . Alcohol abuse Maternal Grandfather    Family Psychiatric  History: Denies history of mental illness.  Social History:  Social History   Substance and Sexual Activity  Alcohol Use Yes   Comment: reports that his last drink was last Wednesday when he had 4- 24oz of liquor, said that 4 days out of the wk he drinks 4-24 ounce beers     Social History   Substance and Sexual Activity  Drug Use Yes  . Types: Cocaine   Comment: last use was "last Wednesday" and said that he uses a "minimal" amount, 1st time he used it since 2016    Social History   Socioeconomic History  . Marital status: Single    Spouse name: Not on  file  . Number of children: Not on file  . Years of education: Not on file  . Highest education level: Not on file  Occupational History  . Occupation: disabled  Tobacco Use  . Smoking status: Never Smoker  . Smokeless tobacco: Never Used  Vaping Use  . Vaping Use: Never used  Substance and Sexual Activity  . Alcohol use: Yes    Comment: reports that his last drink was last Wednesday when he had 4- 24oz of liquor, said that 4 days out of the wk he drinks 4-24 ounce beers  . Drug use: Yes    Types: Cocaine    Comment: last use was "last Wednesday" and said that he uses a "minimal" amount, 1st time he used it since 2016  . Sexual activity: Not Currently    Comment: pt said he has an enlarged prostate  Other Topics Concern  . Not on file  Social History Narrative   ** Merged History Encounter **       Social Determinants of Health   Financial Resource Strain:   . Difficulty of Paying Living Expenses: Not on file  Food Insecurity:   . Worried About Programme researcher, broadcasting/film/video in the Last Year: Not on file  . Ran Out of Food in the Last Year: Not on file  Transportation Needs:   . Lack of Transportation (Medical): Not on file  . Lack of Transportation (Non-Medical): Not on file  Physical Activity:   . Days of Exercise per Week: Not on file  . Minutes of Exercise per Session: Not on file  Stress:   . Feeling of Stress : Not on file  Social Connections:   . Frequency of Communication with Friends and Family: Not on file  . Frequency of Social Gatherings with Friends and Family: Not on file  . Attends Religious Services: Not on file  . Active Member of Clubs or Organizations: Not on file  . Attends Banker Meetings: Not on file  . Marital Status: Not on file   Additional Social History:                         Sleep: Good  Appetite:  Fair  Current Medications: Current Facility-Administered Medications  Medication Dose Route Frequency Provider Last Rate  Last Admin  . acetaminophen (TYLENOL) tablet 650 mg  650 mg Oral Q6H PRN Nira Conn A, NP   650 mg at 06/22/20 0818  . alum & mag hydroxide-simeth (MAALOX/MYLANTA) 200-200-20 MG/5ML suspension 30 mL  30 mL Oral Q4H PRN Nira Conn A, NP      . amLODipine (NORVASC) tablet 5 mg  5 mg Oral Daily Nira Conn A, NP   5 mg at 06/22/20 0817  . citalopram (CELEXA) tablet 20 mg  20 mg Oral Daily Arianie Couse, Geralynn Rile, MD  20 mg at 06/22/20 0817  . hydrOXYzine (ATARAX/VISTARIL) tablet 25 mg  25 mg Oral Q6H PRN Starlette Thurow, Geralynn Rile, MD      . loperamide (IMODIUM) capsule 2-4 mg  2-4 mg Oral PRN Dillyn Joaquin, Geralynn Rile, MD      . LORazepam (ATIVAN) tablet 1 mg  1 mg Oral Q6H PRN Derral Colucci, Geralynn Rile, MD   1 mg at 06/22/20 0817  . magnesium hydroxide (MILK OF MAGNESIA) suspension 30 mL  30 mL Oral Daily PRN Nira Conn A, NP      . multivitamin with minerals tablet 1 tablet  1 tablet Oral Daily Chandlor Noecker, Geralynn Rile, MD   1 tablet at 06/22/20 0817  . ondansetron (ZOFRAN-ODT) disintegrating tablet 4 mg  4 mg Oral Q6H PRN Camdan Burdi, Geralynn Rile, MD      . prazosin (MINIPRESS) capsule 1 mg  1 mg Oral QHS Quasim Doyon, MD   1 mg at 06/21/20 2125  . QUEtiapine (SEROQUEL) tablet 300 mg  300 mg Oral QHS Aleysha Meckler, MD   300 mg at 06/21/20 2125  . thiamine tablet 100 mg  100 mg Oral Daily Kaidan Spengler, Geralynn Rile, MD   100 mg at 06/22/20 4098    Lab Results:  Results for orders placed or performed during the hospital encounter of 06/20/20 (from the past 48 hour(s))  Hemoglobin A1c     Status: Abnormal   Collection Time: 06/20/20  6:14 PM  Result Value Ref Range   Hgb A1c MFr Bld 6.0 (H) 4.8 - 5.6 %    Comment: (NOTE)         Prediabetes: 5.7 - 6.4         Diabetes: >6.4         Glycemic control for adults with diabetes: <7.0    Mean Plasma Glucose 126 mg/dL    Comment: (NOTE) Performed At: [facility] [address] [place], [place] [id number] Jolene Schimke MD FA:[id number]     Blood Alcohol level:  Lab Results  Component Value Date   ETH  <10 06/17/2020   ETH 43 (H) 06/14/2020    Metabolic Disorder Labs: Lab Results  Component Value Date   HGBA1C 6.0 (H) 06/20/2020   MPG 126 06/20/2020   No results found for: PROLACTIN Lab Results  Component Value Date   CHOL 130 06/19/2020   TRIG 67 06/19/2020   HDL 27 (L) 06/19/2020   CHOLHDL 4.8 06/19/2020   VLDL 13 06/19/2020   LDLCALC 90 06/19/2020    Physical Findings: AIMS:  , ,  ,  ,    CIWA:  CIWA-Ar Total: 0 COWS:     Musculoskeletal: Strength & Muscle Tone: within normal limits Gait & Station: normal Patient leans: N/A  Psychiatric Specialty Exam: Physical Exam Vitals and nursing note reviewed.  Constitutional:      Appearance: He is obese.  HENT:     Head: Normocephalic and atraumatic.     Nose: Nose normal.  Eyes:     Pupils: Pupils are equal, round, and reactive to light.  Musculoskeletal:     Cervical back: Normal range of motion.  Neurological:     Mental Status: He is alert and oriented to person, place, and time.  Psychiatric:        Attention and Perception: Attention normal. He perceives auditory and visual hallucinations.        Mood and Affect: Mood is anxious and depressed.        Speech: Speech normal.        Behavior: Behavior is  cooperative.        Thought Content: Thought content includes suicidal ideation.     Review of Systems  Constitutional: Positive for activity change, appetite change and fatigue.  HENT: Negative.   Respiratory: Negative.   Gastrointestinal: Negative.   Musculoskeletal: Positive for myalgias.  Neurological: Positive for headaches.  Psychiatric/Behavioral: Positive for dysphoric mood, hallucinations and suicidal ideas. The patient is nervous/anxious.     Blood pressure (!) 158/112, pulse 87, temperature 98.4 F (36.9 C), temperature source Oral, resp. rate 20, height 5\' 7"  (1.702 m), weight 126.1 kg, SpO2 96 %.Body mass index is 43.54 kg/m.  General Appearance: Casual  Eye Contact:  Fair  Speech:   Normal Rate  Volume:  Normal  Mood:  Anxious, Dysphoric, Hopeless and Worthless  Affect:  Restricted  Thought Process:  Linear and Descriptions of Associations: Intact  Orientation:  Full (Time, Place, and Person)  Thought Content:  Hallucinations: Auditory Visual and Rumination  Suicidal Thoughts:  Yes.  without intent/plan  Homicidal Thoughts:  No  Memory:  Immediate;   Fair Recent;   Fair Remote;   Fair  Judgement:  Fair  Insight:  Fair  Psychomotor Activity:  Normal  Concentration:  Concentration: Good and Attention Span: Good  Recall:  Fair  Fund of Knowledge:  Good  Language:  Good  Akathisia:  Negative  Handed:  Right  AIMS (if indicated):     Assets:  Desire for Improvement Resilience  ADL's:  Intact  Cognition:  WNL  Sleep:  Number of Hours: 6.75   Assessment: [name] is a 49 yo M who presented voluntarily on 06/18/2020 to [facility] for chest pain, worsening depression and suicidal thoughts with a plan to cut himself. He was discharged on 06/17/2020 from [place] after ETOH detox. He was admitted to [facility] on 06/20/2020 for ongoing suicidal ideations and depression.   Diagnosis:  1. Major depressive disorder with psychotic features 2. Alcohol dependence 3. Cocaine dependence 4. Hypokalemia 5.  Pre-diabetes   Pertinent findings today:  1. Improved mood but still depressed and anxious.  2. Withdrawal symptoms of high blood pressure, tremors, anxiety 3. Passive Suicidal ideations with no plan 4. Auditory hallucinations telling him worthless but decreased in frequency 5. Visual hallucinations decreasing in frequency   Treatment Plan Summary: Daily contact with patient to assess and evaluate symptoms and progress in treatment.  Plan :   Scheduled medications:  1.Celexa to 20 mg for depressed mood 2.Seroquel to 300 mg for psychosis. 3.Amlodipine 5 mg for blood pressure control 4. Multivitamin 5. KCl 20 meq PO BID for low potassium 6.  Minipress 1 mg for nightmares 7. CIWA   PRN's :  1. Tylenol 650 mg for mild pain 2. Maalox 30 ml for indigestion 3. Vistaril 25 mg 4. Lorazepam 1 mg for CIWA >10.  5. Milk of magnesia 30 ml for mild constipation.  6. Zofran for nausea   Labs:  1. Recent potassium level 3.3 on 06/17/2020 2. Glucose 100, HbA1c 6% 3. UDS positive for BZ's and Cocaine 4. Most recent CIWA was 1  Psychosocial:  1. Encouragement to attend group therapies. 2. Encouragement for medication compliance 3. Appreciate the social work help with placement. 4. Disposition in progress    06/19/2020, MD 06/22/2020, 11:05 AM

## 2020-06-22 NOTE — Progress Notes (Signed)
Pt endorsed SI/ AH- telling to cut self, but pt contracts for safety. Pt visible on the milieu this evening    06/22/20 0000  Psych Admission Type (Psych Patients Only)  Admission Status Voluntary  Psychosocial Assessment  Patient Complaints Suspiciousness;Anxiety  Eye Contact Fair  Facial Expression Flat;Sad;Worried  Affect Depressed  Speech Logical/coherent  Interaction Assertive  Motor Activity Slow  Appearance/Hygiene Disheveled;In hospital gown  Behavior Characteristics Cooperative  Mood Depressed  Thought Process  Coherency WDL  Content WDL  Delusions None reported or observed  Perception Hallucinations  Hallucination Auditory ("The voices telling me to hurt myself, cut again")  Judgment Poor  Confusion None  Danger to Self  Current suicidal ideation? Passive  Self-Injurious Behavior No self-injurious ideation or behavior indicators observed or expressed   Agreement Not to Harm Self Yes  Description of Agreement Pt verbally contracts for safety   Danger to Others  Danger to Others None reported or observed

## 2020-06-22 NOTE — [facility] Group Notes (Signed)
[facility] LCSW Group Therapy  06/22/20 1:30 PM  Type of Therapy: Self-Care  Participation Level:  Did Not Attend   Summary of Progress/Problems: This patient was invited to attend group, however this patient chose not to attend.    Jacinta Shoe, LCSW 06/22/2020, 2:10PM

## 2020-06-23 DIAGNOSIS — F333 Major depressive disorder, recurrent, severe with psychotic symptoms: Secondary | ICD-10-CM

## 2020-06-23 MED ORDER — LORAZEPAM 1 MG PO TABS
ORAL_TABLET | ORAL | Status: AC
  Administered 2020-06-23: 1 mg via ORAL
  Filled 2020-06-23: qty 1

## 2020-06-23 MED ORDER — LORAZEPAM 1 MG PO TABS: 1.0000 mg | ORAL_TABLET | Freq: Once | ORAL | Status: AC | PRN

## 2020-06-23 NOTE — Progress Notes (Signed)
[facility] Group Notes:  (Nursing/MHT/Case Management/Adjunct)  Date:  06/23/2020  Time:  2030  Type of Therapy:  wrap up group  Participation Level:  Active  Participation Quality:  Appropriate, Attentive, Sharing and Supportive  Affect:  Flat  Cognitive:  Appropriate  Insight:  Improving  Engagement in Group:  Engaged  Modes of Intervention:  Clarification, Education and Support  Summary of Progress/Problems: Positive thinking and self care were discussed.   Marcille Buffy 06/23/2020, 9:21 PM

## 2020-06-23 NOTE — [facility] Group Notes (Signed)
[facility] Group Notes: (Clinical Social Work)   06/23/2020      Type of Therapy:  Group Therapy   Participation Level:  Did Not Attend - was invited both individually by MHT and by overhead announcement, chose not to attend.   Ambrose Mantle, LCSW 06/23/2020, 12:10 PM

## 2020-06-23 NOTE — Progress Notes (Signed)
D. Pt complaint of anxiety upon approach, states Vistaril not helpful.  Pt denies SI/HI/AVH at this time.  Pt was positive for evening wrap up group, observed appropriately engaged with peers on the unit.  Pt denies SI/HI/AVH at this time, but does report intermittent SI.    A.  Support and encouragement offered, medication given as ordered.  Spoke with PA and received one time order for Ativan 1 mg for anxiety.  R.  Pt pleased with order, no further complaints voiced.  Will continue to monitor.

## 2020-06-23 NOTE — Plan of Care (Signed)
Nurse discussed anxiety, depression and coping skills with patient.  

## 2020-06-23 NOTE — Progress Notes (Addendum)
[facility] MD Progress Note  06/23/2020 12:19 PM [name]  MRN:  [id number]  Subjective: [name] reported " I am hope to start my life over and move out orf [place]."  Evaluation:  [name] observed resting in bed.  He is awake, alert and oriented x3.  Presents depressed, flat and guarded.  Currently denying suicidal or homicidal ideations.  Does report intermittent hallucinations with voices and shadows, however reports symptoms have improved since his admission.  He denies that the voices are command in nature.  Patient was restarted on home medications on admission.  He reports taking and tolerating medications well.  Currently denying cravings for cocaine or alcohol use.  Reports a poor appetite.  States he has not been resting well throughout the night. "  I have to much on my mind."  Reports he recently met with the social worker who is attempting to find housing which is his biggest stressor.  Denied that he has spoken to his family since his admission. " I just need to find a different payee and start my life over."  Rates his depression 8 out of 10 with 10 being the worst.  Staff to continue to monitor for safety.  Support,encouragement and reassurance was provided.   Principal Problem: MDD (major depressive disorder), recurrent, severe, with psychosis ([name]) Diagnosis: Principal Problem:   MDD (major depressive disorder), recurrent, severe, with psychosis ([name]) Active Problems:   Hypertension   Post traumatic stress disorder (PTSD)   Cocaine dependence with cocaine-induced mood disorder ([name])   Alcohol abuse with alcohol-induced mood disorder (HCC)   Cocaine abuse ([name])   Class 2 obesity due to excess calories with body mass index (BMI) of 39.0 to 39.9 in adult   Bipolar 1 disorder ([name])  Total Time spent with patient: 15 minutes  Past Psychiatric History:  Past Medical History:  Past Medical History:  Diagnosis Date  . Alcohol dependence with withdrawal ([place])   . Anxiety   .  Asthma   . Atrial fibrillation ([place])    not on AC  . Bipolar 1 disorder ([place])   . Class 2 obesity   . Cocaine abuse ([place])   . Depression   . Heart palpitations    with severe anxiety  . Hypertension   . PTSD (post-traumatic stress disorder)   . PTSD (post-traumatic stress disorder)     Past Surgical History:  Procedure Laterality Date  . NO PAST SURGERIES     Family History:  Family History  Problem Relation Age of Onset  . Alcohol abuse Mother   . Hypertension Father   . Dementia Father   . Alcohol abuse Maternal Grandfather    Family Psychiatric  History:  Social History:  Social History   Substance and Sexual Activity  Alcohol Use Yes   Comment: reports that his last drink was last Wednesday when he had 4- 24oz of liquor, said that 4 days out of the wk he drinks 4-24 ounce beers     Social History   Substance and Sexual Activity  Drug Use Yes  . Types: Cocaine   Comment: last use was "last Wednesday" and said that he uses a "minimal" amount, 1st time he used it since 2016    Social History   Socioeconomic History  . Marital status: Single    Spouse name: Not on file  . Number of children: Not on file  . Years of education: Not on file  . Highest education level: Not on file  Occupational  History  . Occupation: disabled  Tobacco Use  . Smoking status: Never Smoker  . Smokeless tobacco: Never Used  Vaping Use  . Vaping Use: Never used  Substance and Sexual Activity  . Alcohol use: Yes    Comment: reports that his last drink was last Wednesday when he had 4- 24oz of liquor, said that 4 days out of the wk he drinks 4-24 ounce beers  . Drug use: Yes    Types: Cocaine    Comment: last use was "last Wednesday" and said that he uses a "minimal" amount, 1st time he used it since 2016  . Sexual activity: Not Currently    Comment: pt said he has an enlarged prostate  Other Topics Concern  . Not on file  Social History Narrative   ** Merged History Encounter  **       Social Determinants of Health   Financial Resource Strain:   . Difficulty of Paying Living Expenses: Not on file  Food Insecurity:   . Worried About Charity fundraiser in the Last Year: Not on file  . Ran Out of Food in the Last Year: Not on file  Transportation Needs:   . Lack of Transportation (Medical): Not on file  . Lack of Transportation (Non-Medical): Not on file  Physical Activity:   . Days of Exercise per Week: Not on file  . Minutes of Exercise per Session: Not on file  Stress:   . Feeling of Stress : Not on file  Social Connections:   . Frequency of Communication with Friends and Family: Not on file  . Frequency of Social Gatherings with Friends and Family: Not on file  . Attends Religious Services: Not on file  . Active Member of Clubs or Organizations: Not on file  . Attends Archivist Meetings: Not on file  . Marital Status: Not on file   Additional Social History:                         Sleep: Poor  Appetite:  Poor  Current Medications: Current Facility-Administered Medications  Medication Dose Route Frequency Provider Last Rate Last Admin  . acetaminophen (TYLENOL) tablet 650 mg  650 mg Oral Q6H PRN Rozetta Nunnery, NP   650 mg at 06/22/20 2127  . alum & mag hydroxide-simeth (MAALOX/MYLANTA) 200-200-20 MG/5ML suspension 30 mL  30 mL Oral Q4H PRN Lindon Romp A, NP      . amLODipine (NORVASC) tablet 5 mg  5 mg Oral Daily Lindon Romp A, NP   5 mg at 06/23/20 0900  . citalopram (CELEXA) tablet 20 mg  20 mg Oral Daily Dagar, Anjali, MD   20 mg at 06/23/20 0900  . magnesium hydroxide (MILK OF MAGNESIA) suspension 30 mL  30 mL Oral Daily PRN Lindon Romp A, NP      . multivitamin with minerals tablet 1 tablet  1 tablet Oral Daily Dagar, Anjali, MD   1 tablet at 06/23/20 0900  . prazosin (MINIPRESS) capsule 1 mg  1 mg Oral QHS Dagar, Anjali, MD   1 mg at 06/22/20 2125  . QUEtiapine (SEROQUEL) tablet 300 mg  300 mg Oral QHS Dagar,  Anjali, MD   300 mg at 06/22/20 2125  . thiamine tablet 100 mg  100 mg Oral Daily Dagar, Anjali, MD   100 mg at 06/23/20 0900    Lab Results: No results found for this or any previous visit (from the past  48 hour(s)).  Blood Alcohol level:  Lab Results  Component Value Date   ETH <10 06/17/2020   ETH 43 (H) 50/15/8682    Metabolic Disorder Labs: Lab Results  Component Value Date   HGBA1C 6.0 (H) 06/20/2020   MPG 126 06/20/2020   No results found for: PROLACTIN Lab Results  Component Value Date   CHOL 130 06/19/2020   TRIG 67 06/19/2020   HDL 27 (L) 06/19/2020   CHOLHDL 4.8 06/19/2020   VLDL 13 06/19/2020   [place] 90 06/19/2020    Physical Findings: AIMS:  , ,  ,  ,    CIWA:  CIWA-Ar Total: 3 COWS:     Musculoskeletal: Strength & Muscle Tone: within normal limits Gait & Station: normal Patient leans: N/A  Psychiatric Specialty Exam: Physical Exam Vitals reviewed.  Constitutional:      Appearance: He is obese.  Neurological:     Mental Status: He is alert.  Psychiatric:        Mood and Affect: Mood normal.        Thought Content: Thought content normal. Thought content does not include suicidal ideation. Thought content does not include suicidal plan.        Cognition and Memory: Cognition normal.     Review of Systems  Constitutional: Positive for fatigue.  Psychiatric/Behavioral: Positive for decreased concentration, dysphoric mood and hallucinations. Negative for suicidal ideas.  All other systems reviewed and are negative.   Blood pressure (!) 141/95, pulse 83, temperature (!) 96.5 F (35.8 C), resp. rate 20, height '5\' 7"'  (1.702 m), weight 126.1 kg, SpO2 91 %.Body mass index is 43.54 kg/m.  General Appearance: Casual  Eye Contact:  Fair  Speech:  Clear and Coherent  Volume:  Normal  Mood:  Anxious and Depressed  Affect:  Appropriate  Thought Process:  Coherent  Orientation:  Full (Time, Place, and Person)  Thought Content:  Hallucinations:  Auditory Visual  Suicidal Thoughts:  No  Homicidal Thoughts:  No  Memory:  Immediate;   Fair Remote;   Fair  Judgement:  Fair  Insight:  Fair  Psychomotor Activity:  Normal  Concentration:  Concentration: Fair  Recall:  [employer] of Knowledge:  Fair  Language:  Fair  Akathisia:  No  Handed:  Right  AIMS (if indicated):     Assets:  Communication Skills Desire for Improvement Social Support  ADL's:  Intact  Cognition:  WNL  Sleep:  Number of Hours: 6.3     Treatment Plan Summary: Daily contact with patient to assess and evaluate symptoms and progress in treatment and Medication management   Treatment plan was reviewed and agreed upon by NPT Abdiel Blackerby and patient Sidney  Paquette's need for continued inpatient admission on 06/23/2020  Medication management:  Continues to Celexa 20 mg p.o. daily for depression/anxiety Continue Seroquel 300 mg p.o. nightly for mood stabilization  CSW to continue working on discharge disposition Patient encouraged to participate within the therapeutic milieu  [place], NP 06/23/2020, 12:19 PM

## 2020-06-23 NOTE — Progress Notes (Signed)
°   06/23/20 2332  COVID-19 Daily Checkoff  Have you had a fever (temp > 37.80C/100F)  in the past 24 hours?  No  If you have had runny nose, nasal congestion, sneezing in the past 24 hours, has it worsened? No  COVID-19 EXPOSURE  Have you traveled outside the state in the past 14 days? No  Have you been in contact with someone with a confirmed diagnosis of COVID-19 or PUI in the past 14 days without wearing appropriate PPE? No  Have you been living in the same home as a person with confirmed diagnosis of COVID-19 or a PUI (household contact)? No  Have you been diagnosed with COVID-19? No

## 2020-06-24 DIAGNOSIS — F333 Major depressive disorder, recurrent, severe with psychotic symptoms: Secondary | ICD-10-CM | POA: Diagnosis not present

## 2020-06-24 NOTE — Plan of Care (Addendum)
Patient continues to lay in bed this afternoon sleeping.

## 2020-06-24 NOTE — Progress Notes (Signed)
[facility] Group Notes:  (Nursing/MHT/Case Management/Adjunct)  Date:  06/24/2020  Time:  2030  Type of Therapy:  wrap up group  Participation Level:  Active  Participation Quality:  Appropriate, Attentive, Sharing and Supportive  Affect:  Flat  Cognitive:  Appropriate  Insight:  Improving  Engagement in Group:  Engaged  Modes of Intervention:  Clarification, Education and Support  Summary of Progress/Problems: Positive thinking and positive change were discussed.   [name] 06/24/2020, 8:53 PM

## 2020-06-24 NOTE — Progress Notes (Signed)
[facility] MD Progress Note  06/24/2020 11:22 AM [name]  MRN:  [id number]   Evaluation:  [name] observed resting in bed. Present flat, guarded but pleasant with today's assessment.  Reported feeling better overall.  States his mood has improved since his admission.  Patient reports he is awaiting placement. Ivis reports taking and tolerating medications well denying any medication sideefects,.  Patient reports attending daily group sessions mostly in the evenings.  States he has been tired and restless throughout the day.  Reports a good appetite.  States he is resting " okay" throughout the night.  He continues to deny suicidal or homicidal ideations.  Denies auditory or visual hallucinations.  Staff to continue to monitor for safety. Support, encouragement and reassurance was provided.   Principal Problem: MDD (major depressive disorder), recurrent, severe, with psychosis (HCC) Diagnosis: Principal Problem:   MDD (major depressive disorder), recurrent, severe, with psychosis (HCC) Active Problems:   Hypertension   Post traumatic stress disorder (PTSD)   Cocaine dependence with cocaine-induced mood disorder (HCC)   Alcohol abuse with alcohol-induced mood disorder (HCC)   Cocaine abuse (HCC)   Class 2 obesity due to excess calories with body mass index (BMI) of 39.0 to 39.9 in adult   Bipolar 1 disorder (HCC)  Total Time spent with patient: 15 minutes  Past Psychiatric History:  Past Medical History:  Past Medical History:  Diagnosis Date  . Alcohol dependence with withdrawal (HCC)   . Anxiety   . Asthma   . Atrial fibrillation (HCC)    not on AC  . Bipolar 1 disorder (HCC)   . Class 2 obesity   . Cocaine abuse (HCC)   . Depression   . Heart palpitations    with severe anxiety  . Hypertension   . PTSD (post-traumatic stress disorder)   . PTSD (post-traumatic stress disorder)     Past Surgical History:  Procedure Laterality Date  . NO PAST SURGERIES     Family History:   Family History  Problem Relation Age of Onset  . Alcohol abuse Mother   . Hypertension Father   . Dementia Father   . Alcohol abuse Maternal Grandfather    Family Psychiatric  History:  Social History:  Social History   Substance and Sexual Activity  Alcohol Use Yes   Comment: reports that his last drink was last Wednesday when he had 4- 24oz of liquor, said that 4 days out of the wk he drinks 4-24 ounce beers     Social History   Substance and Sexual Activity  Drug Use Yes  . Types: Cocaine   Comment: last use was "last Wednesday" and said that he uses a "minimal" amount, 1st time he used it since 2016    Social History   Socioeconomic History  . Marital status: Single    Spouse name: Not on file  . Number of children: Not on file  . Years of education: Not on file  . Highest education level: Not on file  Occupational History  . Occupation: disabled  Tobacco Use  . Smoking status: Never Smoker  . Smokeless tobacco: Never Used  Vaping Use  . Vaping Use: Never used  Substance and Sexual Activity  . Alcohol use: Yes    Comment: reports that his last drink was last Wednesday when he had 4- 24oz of liquor, said that 4 days out of the wk he drinks 4-24 ounce beers  . Drug use: Yes    Types: Cocaine  Comment: last use was "last Wednesday" and said that he uses a "minimal" amount, 1st time he used it since 2016  . Sexual activity: Not Currently    Comment: pt said he has an enlarged prostate  Other Topics Concern  . Not on file  Social History Narrative   ** Merged History Encounter **       Social Determinants of Health   Financial Resource Strain:   . Difficulty of Paying Living Expenses: Not on file  Food Insecurity:   . Worried About Programme researcher, broadcasting/film/video in the Last Year: Not on file  . Ran Out of Food in the Last Year: Not on file  Transportation Needs:   . Lack of Transportation (Medical): Not on file  . Lack of Transportation (Non-Medical): Not on file   Physical Activity:   . Days of Exercise per Week: Not on file  . Minutes of Exercise per Session: Not on file  Stress:   . Feeling of Stress : Not on file  Social Connections:   . Frequency of Communication with Friends and Family: Not on file  . Frequency of Social Gatherings with Friends and Family: Not on file  . Attends Religious Services: Not on file  . Active Member of Clubs or Organizations: Not on file  . Attends Banker Meetings: Not on file  . Marital Status: Not on file   Additional Social History:                         Sleep: Poor  Appetite:  Poor  Current Medications: Current Facility-Administered Medications  Medication Dose Route Frequency Provider Last Rate Last Admin  . acetaminophen (TYLENOL) tablet 650 mg  650 mg Oral Q6H PRN Nira Conn A, NP   650 mg at 06/23/20 1741  . alum & mag hydroxide-simeth (MAALOX/MYLANTA) 200-200-20 MG/5ML suspension 30 mL  30 mL Oral Q4H PRN Nira Conn A, NP      . amLODipine (NORVASC) tablet 5 mg  5 mg Oral Daily Nira Conn A, NP   5 mg at 06/24/20 0806  . citalopram (CELEXA) tablet 20 mg  20 mg Oral Daily Dagar, Anjali, MD   20 mg at 06/24/20 0806  . magnesium hydroxide (MILK OF MAGNESIA) suspension 30 mL  30 mL Oral Daily PRN Nira Conn A, NP      . multivitamin with minerals tablet 1 tablet  1 tablet Oral Daily Dagar, Anjali, MD   1 tablet at 06/24/20 0806  . prazosin (MINIPRESS) capsule 1 mg  1 mg Oral QHS Dagar, Anjali, MD   1 mg at 06/23/20 2103  . QUEtiapine (SEROQUEL) tablet 300 mg  300 mg Oral QHS Dagar, Anjali, MD   300 mg at 06/23/20 2102  . thiamine tablet 100 mg  100 mg Oral Daily Dagar, Anjali, MD   100 mg at 06/24/20 8889    Lab Results: No results found for this or any previous visit (from the past 48 hour(s)).  Blood Alcohol level:  Lab Results  Component Value Date   ETH <10 06/17/2020   ETH 43 (H) 06/14/2020    Metabolic Disorder Labs: Lab Results  Component Value Date    HGBA1C 6.0 (H) 06/20/2020   MPG 126 06/20/2020   No results found for: PROLACTIN Lab Results  Component Value Date   CHOL 130 06/19/2020   TRIG 67 06/19/2020   HDL 27 (L) 06/19/2020   CHOLHDL 4.8 06/19/2020   VLDL  13 06/19/2020   LDLCALC 90 06/19/2020    Physical Findings: AIMS: Facial and Oral Movements Muscles of Facial Expression: None, normal Lips and Perioral Area: None, normal Jaw: None, normal Tongue: None, normal,Extremity Movements Upper (arms, wrists, hands, fingers): None, normal Lower (legs, knees, ankles, toes): None, normal, Trunk Movements Neck, shoulders, hips: None, normal, Overall Severity Severity of abnormal movements (highest score from questions above): None, normal Incapacitation due to abnormal movements: None, normal Patient's awareness of abnormal movements (rate only patient's report): No Awareness, Dental Status Current problems with teeth and/or dentures?: No Does patient usually wear dentures?: No  CIWA:  CIWA-Ar Total: 3 COWS:     Musculoskeletal: Strength & Muscle Tone: within normal limits Gait & Station: normal Patient leans: N/A  Psychiatric Specialty Exam: Physical Exam Vitals reviewed.  Constitutional:      Appearance: He is obese.  Neurological:     Mental Status: He is alert and oriented to person, place, and time.  Psychiatric:        Mood and Affect: Mood normal.        Thought Content: Thought content normal. Thought content does not include suicidal ideation. Thought content does not include suicidal plan.        Cognition and Memory: Cognition normal.     Review of Systems  Constitutional: Positive for fatigue.  Psychiatric/Behavioral: Positive for decreased concentration, dysphoric mood and hallucinations. Negative for suicidal ideas.  All other systems reviewed and are negative.   Blood pressure (!) 140/94, pulse (!) 101, temperature 98.4 F (36.9 C), temperature source Oral, resp. rate 20, height 5\' 7"  (1.702 m),  weight 126.1 kg, SpO2 96 %.Body mass index is 43.54 kg/m.  General Appearance: Casual  Eye Contact:  Fair  Speech:  Clear and Coherent  Volume:  Normal  Mood:  Anxious and Depressed  Affect:  Appropriate  Thought Process:  Coherent  Orientation:  Full (Time, Place, and Person)  Thought Content:  Logical and Rumination  Suicidal Thoughts:  No  Homicidal Thoughts:  No  Memory:  Immediate;   Fair Remote;   Fair  Judgement:  Fair  Insight:  Fair  Psychomotor Activity:  Normal  Concentration:  Concentration: Fair  Recall:  of Knowledge:  Fair  Language:  Fair  Akathisia:  No  Handed:  Right  AIMS (if indicated):     Assets:  Communication Skills Desire for Improvement Social Support  ADL's:  Intact  Cognition:  WNL  Sleep:  Number of Hours: 6.5     Treatment Plan Summary: Daily contact with patient to assess and evaluate symptoms and progress in treatment and Medication management   Treatment plan was reviewed and agreed upon by NPT [name] and patient Marcus  [name]'s need for continued inpatient admission on 06/24/2020  Medication management:  Continues to Celexa 20 mg p.o. daily for depression/anxiety Continue Seroquel 300 mg p.o. nightly for mood stabilization  CSW to continue working on discharge disposition Patient encouraged to participate within the therapeutic milieu  06/26/2020, NP 06/24/2020, 11:22 AM

## 2020-06-24 NOTE — Progress Notes (Signed)
   06/24/20 2355  COVID-19 Daily Checkoff  Have you had a fever (temp > 37.80C/100F)  in the past 24 hours?  No  If you have had runny nose, nasal congestion, sneezing in the past 24 hours, has it worsened? No  COVID-19 EXPOSURE  Have you traveled outside the state in the past 14 days? No  Have you been in contact with someone with a confirmed diagnosis of COVID-19 or PUI in the past 14 days without wearing appropriate PPE? No  Have you been living in the same home as a person with confirmed diagnosis of COVID-19 or a PUI (household contact)? No  Have you been diagnosed with COVID-19? No

## 2020-06-24 NOTE — [facility] Group Notes (Signed)
[facility] LCSW Group Therapy Note ° °Date/Time:  06/24/2020 9:00-10:00 or 10:00-11:00AM ° °Type of Therapy and Topic:  Group Therapy:  Healthy and Unhealthy Supports ° °Participation Level:  Did Not Attend  ° °Description of Group:  Patients in this group were introduced to the idea of adding a variety of healthy supports to address the various needs in their lives.Patients discussed what additional healthy supports could be helpful in their recovery and wellness after discharge in order to prevent future hospitalizations.   An emphasis was placed on using counselor, doctor, therapy groups, 12-step groups, and problem-specific support groups to expand supports.  Several songs were played to emphasize points made throughout group. ° °Therapeutic Goals: ° ° 1)  discuss importance of adding supports to stay well once out of the hospital ° 2)  compare healthy versus unhealthy supports and identify some examples of each ° 3)  generate ideas and descriptions of healthy supports that can be added ° 4)  offer mutual support about how to address unhealthy supports ° 5)  encourage active participation in and adherence to discharge plan °  ° °Summary of Patient Progress:  The patient was invited to group, did not attend. ° ° °Therapeutic Modalities:   °Motivational Interviewing °Brief Solution-Focused Therapy ° °Paityn Balsam Grossman-Orr, LCSW ° °  °  ° ° °

## 2020-06-24 NOTE — Plan of Care (Signed)
Nurse discussed anxiety, depression and coping skills with patient.  

## 2020-06-24 NOTE — Progress Notes (Addendum)
D:  Patient stated he is SI at times, off/on, contracts for safety, no plan. A:  Medications administered per MD orders.  Emotional support and encouragement given patient. R:  Safety maintained with 15 minute checks. Patient denied SI while talking to nurse this morning also.   Patient has been in bed most of the day.

## 2020-06-24 NOTE — Progress Notes (Signed)
   06/24/20 2358  Psych Admission Type (Psych Patients Only)  Admission Status Voluntary  Psychosocial Assessment  Patient Complaints Anxiety  Eye Contact Fair  Facial Expression Anxious  Affect Appropriate to circumstance  Speech Logical/coherent  Interaction Minimal  Motor Activity Slow  Appearance/Hygiene Unremarkable  Behavior Characteristics Appropriate to situation  Mood Anxious;Pleasant  Thought Process  Coherency WDL  Content WDL  Delusions None reported or observed  Perception WDL  Hallucination None reported or observed  Judgment Impaired  Confusion None  Danger to Self  Current suicidal ideation? Passive  Self-Injurious Behavior No self-injurious ideation or behavior indicators observed or expressed   Agreement Not to Harm Self Yes  Description of Agreement Pt verbally contracts for safety   Danger to Others  Danger to Others None reported or observed

## 2020-06-25 DIAGNOSIS — F333 Major depressive disorder, recurrent, severe with psychotic symptoms: Secondary | ICD-10-CM | POA: Diagnosis not present

## 2020-06-25 MED ORDER — HYDROXYZINE HCL 50 MG PO TABS
50.0000 mg | ORAL_TABLET | Freq: Three times a day (TID) | ORAL | Status: DC | PRN
  Administered 2020-06-25 – 2020-06-26 (×3): 50 mg via ORAL
  Filled 2020-06-25 (×4): qty 1

## 2020-06-25 NOTE — Plan of Care (Signed)
Nurse discussed anxiety, depression and coping skills with patient.  

## 2020-06-25 NOTE — Progress Notes (Signed)
   06/25/20 2030  COVID-19 Daily Checkoff  Have you had a fever (temp > 37.80C/100F)  in the past 24 hours?  No  COVID-19 EXPOSURE  Have you traveled outside the state in the past 14 days? No  Have you been in contact with someone with a confirmed diagnosis of COVID-19 or PUI in the past 14 days without wearing appropriate PPE? No  Have you been living in the same home as a person with confirmed diagnosis of COVID-19 or a PUI (household contact)? No  Have you been diagnosed with COVID-19? No

## 2020-06-25 NOTE — Progress Notes (Signed)
Adult Psychoeducational Group Note  Date:  06/25/2020 Time:  10:17 AM  Group Topic/Focus:  Wrap-Up Group:   The focus of this group is to help patients review their daily goal of treatment and discuss progress on daily workbooks.  Participation Level:  Active  Participation Quality:  Appropriate  Affect:  Appropriate  Cognitive:  Appropriate  Insight: Appropriate  Engagement in Group:  Engaged  Modes of Intervention:  Discussion  Additional Comments:  Patient attended goals group and said that his goal for today is to attend all groups. Ragen Laver W Hollace Michelli 06/25/2020, 10:17 AM

## 2020-06-25 NOTE — [facility] Counselor (Signed)
FOLLOW-UP: CSW contacted [employer] and learned that pt needs to call and complete a pre-screen [phone]) and then an admission date can be arranged. CSW provided pt with the information and encouraged him to call and complete the pre-screen.  Fredirick Lathe, LCSWA Clinicial Social Worker [employer]

## 2020-06-25 NOTE — Progress Notes (Signed)
Patient ID: [name], male   DOB: September 18, 1970, 49 y.o.   MRN: [id number]   [facility] MD Progress Note  06/25/2020 3:11 PM [name]  MRN:  [id number]   Daily note: Patient seen, chart reviewed, case discussed with treatment team.  [name] was seen laying down in bed resting.  He had been in group and had been interacting appropriately with peers.  He reports that he still has occasional anxiety.  He is looking forward to his meeting with the placement specialist later today.  Denies any adverse effects from medication.  He is eating and sleeping well. Mood is improving slowly.   Principal Problem: MDD (major depressive disorder), recurrent, severe, with psychosis (HCC) Diagnosis: Principal Problem:   MDD (major depressive disorder), recurrent, severe, with psychosis (HCC) Active Problems:   Hypertension   Post traumatic stress disorder (PTSD)   Cocaine dependence with cocaine-induced mood disorder (HCC)   Alcohol abuse with alcohol-induced mood disorder (HCC)   Cocaine abuse (HCC)   Class 2 obesity due to excess calories with body mass index (BMI) of 39.0 to 39.9 in adult   Bipolar 1 disorder (HCC)  Total Time spent with patient: 15 minutes  Past Psychiatric History:  Past Medical History:  Past Medical History:  Diagnosis Date  . Alcohol dependence with withdrawal (HCC)   . Anxiety   . Asthma   . Atrial fibrillation (HCC)    not on AC  . Bipolar 1 disorder (HCC)   . Class 2 obesity   . Cocaine abuse (HCC)   . Depression   . Heart palpitations    with severe anxiety  . Hypertension   . PTSD (post-traumatic stress disorder)   . PTSD (post-traumatic stress disorder)     Past Surgical History:  Procedure Laterality Date  . NO PAST SURGERIES     Family History:  Family History  Problem Relation Age of Onset  . Alcohol abuse Mother   . Hypertension Father   . Dementia Father   . Alcohol abuse Maternal Grandfather    Family Psychiatric  History:  Social History:   Social History   Substance and Sexual Activity  Alcohol Use Yes   Comment: reports that his last drink was last Wednesday when he had 4- 24oz of liquor, said that 4 days out of the wk he drinks 4-24 ounce beers     Social History   Substance and Sexual Activity  Drug Use Yes  . Types: Cocaine   Comment: last use was "last Wednesday" and said that he uses a "minimal" amount, 1st time he used it since 2016    Social History   Socioeconomic History  . Marital status: Single    Spouse name: Not on file  . Number of children: Not on file  . Years of education: Not on file  . Highest education level: Not on file  Occupational History  . Occupation: disabled  Tobacco Use  . Smoking status: Never Smoker  . Smokeless tobacco: Never Used  Vaping Use  . Vaping Use: Never used  Substance and Sexual Activity  . Alcohol use: Yes    Comment: reports that his last drink was last Wednesday when he had 4- 24oz of liquor, said that 4 days out of the wk he drinks 4-24 ounce beers  . Drug use: Yes    Types: Cocaine    Comment: last use was "last Wednesday" and said that he uses a "minimal" amount, 1st time he used it since  2016  . Sexual activity: Not Currently    Comment: pt said he has an enlarged prostate  Other Topics Concern  . Not on file  Social History Narrative   ** Merged History Encounter **       Social Determinants of Health   Financial Resource Strain:   . Difficulty of Paying Living Expenses: Not on file  Food Insecurity:   . Worried About Programme researcher, broadcasting/film/video in the Last Year: Not on file  . Ran Out of Food in the Last Year: Not on file  Transportation Needs:   . Lack of Transportation (Medical): Not on file  . Lack of Transportation (Non-Medical): Not on file  Physical Activity:   . Days of Exercise per Week: Not on file  . Minutes of Exercise per Session: Not on file  Stress:   . Feeling of Stress : Not on file  Social Connections:   . Frequency of  Communication with Friends and Family: Not on file  . Frequency of Social Gatherings with Friends and Family: Not on file  . Attends Religious Services: Not on file  . Active Member of Clubs or Organizations: Not on file  . Attends Banker Meetings: Not on file  . Marital Status: Not on file   Additional Social History:                         Sleep: Poor  Appetite:  Poor  Current Medications: Current Facility-Administered Medications  Medication Dose Route Frequency Provider Last Rate Last Admin  . acetaminophen (TYLENOL) tablet 650 mg  650 mg Oral Q6H PRN Nira Conn A, NP   650 mg at 06/23/20 1741  . alum & mag hydroxide-simeth (MAALOX/MYLANTA) 200-200-20 MG/5ML suspension 30 mL  30 mL Oral Q4H PRN Nira Conn A, NP      . amLODipine (NORVASC) tablet 5 mg  5 mg Oral Daily Nira Conn A, NP   5 mg at 06/25/20 0751  . citalopram (CELEXA) tablet 20 mg  20 mg Oral Daily Dagar, Anjali, MD   20 mg at 06/25/20 0751  . hydrOXYzine (ATARAX/VISTARIL) tablet 50 mg  50 mg Oral TID PRN Harlo Fabela, Worthy Rancher, MD      . magnesium hydroxide (MILK OF MAGNESIA) suspension 30 mL  30 mL Oral Daily PRN Nira Conn A, NP      . multivitamin with minerals tablet 1 tablet  1 tablet Oral Daily Dagar, Geralynn Rile, MD   1 tablet at 06/25/20 0751  . prazosin (MINIPRESS) capsule 1 mg  1 mg Oral QHS Dagar, Anjali, MD   1 mg at 06/24/20 2135  . QUEtiapine (SEROQUEL) tablet 300 mg  300 mg Oral QHS Dagar, Anjali, MD   300 mg at 06/24/20 2135  . thiamine tablet 100 mg  100 mg Oral Daily Dagar, Anjali, MD   100 mg at 06/25/20 0750    Lab Results: No results found for this or any previous visit (from the past 48 hour(s)).  Blood Alcohol level:  Lab Results  Component Value Date   ETH <10 06/17/2020   ETH 43 (H) 06/14/2020    Metabolic Disorder Labs: Lab Results  Component Value Date   HGBA1C 6.0 (H) 06/20/2020   MPG 126 06/20/2020   No results found for: PROLACTIN Lab Results   Component Value Date   CHOL 130 06/19/2020   TRIG 67 06/19/2020   HDL 27 (L) 06/19/2020   CHOLHDL 4.8 06/19/2020  VLDL 13 06/19/2020   LDLCALC 90 06/19/2020    Physical Findings: AIMS: Facial and Oral Movements Muscles of Facial Expression: None, normal Lips and Perioral Area: None, normal Jaw: None, normal Tongue: None, normal,Extremity Movements Upper (arms, wrists, hands, fingers): None, normal Lower (legs, knees, ankles, toes): None, normal, Trunk Movements Neck, shoulders, hips: None, normal, Overall Severity Severity of abnormal movements (highest score from questions above): None, normal Incapacitation due to abnormal movements: None, normal Patient's awareness of abnormal movements (rate only patient's report): No Awareness, Dental Status Current problems with teeth and/or dentures?: No Does patient usually wear dentures?: No  CIWA:  CIWA-Ar Total: 1 COWS:     Musculoskeletal: Strength & Muscle Tone: within normal limits Gait & Station: normal Patient leans: N/A  Psychiatric Specialty Exam: Physical Exam Vitals reviewed.  Constitutional:      Appearance: He is obese.  Neurological:     Mental Status: He is alert and oriented to person, place, and time.  Psychiatric:        Mood and Affect: Mood normal.        Thought Content: Thought content normal. Thought content does not include suicidal ideation. Thought content does not include suicidal plan.        Cognition and Memory: Cognition normal.     Review of Systems  Constitutional: Positive for fatigue.  Psychiatric/Behavioral: Positive for decreased concentration, dysphoric mood and hallucinations. Negative for suicidal ideas.  All other systems reviewed and are negative.   Blood pressure (!) 145/97, pulse 89, temperature 98.3 F (36.8 C), temperature source Oral, resp. rate 20, height 5\' 7"  (1.702 m), weight 126.1 kg, SpO2 97 %.Body mass index is 43.54 kg/m.  General Appearance: Casual  Eye Contact:   Fair  Speech:  Clear and Coherent  Volume:  Normal  Mood:  Anxious and Depressed  Affect:  Appropriate  Thought Process:  Coherent  Orientation:  Full (Time, Place, and Person)  Thought Content:  Logical and Rumination  Suicidal Thoughts:  No  Homicidal Thoughts:  No  Memory:  Immediate;   Fair Remote;   Fair  Judgement:  Fair  Insight:  Fair  Psychomotor Activity:  Normal  Concentration:  Concentration: Fair  Recall:  of Knowledge:  Fair  Language:  Fair  Akathisia:  No  Handed:  Right  AIMS (if indicated):     Assets:  Communication Skills Desire for Improvement Social Support  ADL's:  Intact  Cognition:  WNL  Sleep:  Number of Hours: 5.25     Treatment Plan Summary: Daily contact with patient to assess and evaluate symptoms and progress in treatment and Medication management   49 year old male with a diagnosis of bipolar disorder currently being stabilized with medication management awaiting placement.  Medication management:  Continues to Celexa 20 mg p.o. daily for depression/anxiety Continue Seroquel 300 mg p.o. nightly for mood stabilization Vistaril 50 mg p.o. 3 times daily as needed anxiety.  CSW to continue working on discharge disposition Patient encouraged to participate within the therapeutic milieu  53, MD 06/25/2020, 3:11 PM

## 2020-06-25 NOTE — [facility] Suicide Risk Assessment (Signed)
[facility] INPATIENT:  Family/Significant Other Suicide Prevention Education  Suicide Prevention Education:  Education Completed; [name] [phone]), father, has been identified by the patient as the family member/significant other with whom the patient will be residing, and identified as the person(s) who will aid the patient in the event of a mental health crisis (suicidal ideations/suicide attempt).  With written consent from the patient, the family member/significant other has been provided the following suicide prevention education, prior to the and/or following the discharge of the patient.  The suicide prevention education provided includes the following:  Suicide risk factors  Suicide prevention and interventions  National Suicide Hotline telephone number  [facility] assessment telephone number  [facility] Emergency Assistance 911  [facility] and/or Residential Mobile Crisis Unit telephone number  Request made of family/significant other to:  Remove weapons (e.g., guns, rifles, knives), all items previously/currently identified as safety concern.    Remove drugs/medications (over-the-counter, prescriptions, illicit drugs), all items previously/currently identified as a safety concern.  CSW spoke with Pt's father, [name]. He reported that Pt is NOT allowed to return home. Father stated that he has made attempts to refer Pt to OP providers however he refuses to engage in the appointments. He stated that Pt has lived in the home for the last 10/11 years. During that time Pt has left the home 2/3 times. He stated that he has been Pt's payee since since 2010. He reported that if Pt would like another payee, he has the option to find someone else. He reports that he does take Pt grocery shopping every Wednesday and attempts to give him autonomy however he does not do well managing money in hand. He did state that he has no issue saving up Pt's funds to  find his own living arrangements. He stated that while living in the home the police have had to be called due to Pt become belligerent. He denies that Pt has been physically aggressive. He did state concerns related to Pt purchasing medication online. He stated that Pt will purchase "Soma" from alone and abuse the medication. He stated that the medication is meant to last 4 months and he typically goes through the medication within 30-60 days. He stated that Pt lies often about not having access to his own funds. He stated that prior to admission, Pt was given the choice to remain home or take $500 to use responsibly as [name] stated he had found someone else to reside with. Pt took the money, however he used the money to be abuse substances. Father stated that Pt has an issue with crack-cocaine as well as alcohol. He reported that most recently Pt sold his television and did not use the $500 provided to find his own home.  The family member/significant other verbalizes understanding of the suicide prevention education information provided. The family member/significant other agrees to remove the items of safety concern listed above.  Joelyn Oms Dorrine Montone, LCSW 06/25/2020, 9:16 AM

## 2020-06-25 NOTE — [facility] Counselor (Signed)
CSW contacted [employer] and learned that pt had not completed his pre-screen. CSW contacted [facility] and learned that pt has been placed on the wait list but needs to call and complete assessment.  CSW provided pt with the contact for [facility] and encouraged him to call and complete both items as his d/c is tomorrow. Pt shushed CSW out of his room.  Fredirick Lathe, LCSWA Clinicial Social Worker [employer]

## 2020-06-25 NOTE — Progress Notes (Signed)
D:  Patient's self inventory sheet, patient has poor sleep, sleep medication given.  Poor appetite, low energy level, poor concentration.  Rated depression 6, hopeless 4, anxiety 8.  Withdrawals, chilling, agitation, irritability.  SI, contracts for safety.  Physical problems.  Physical pain, worst pain #5 in past 24 hours.  Pain med helpful.  Goal is discharge plan.  Will discuss with SW.  Bad anxiety.  No discharge plans. A:  Medications administered per MD orders.  Emotional support and encouragement given patient.  R:  Denied SI and HI, contracts for safety.  Denied A/V hallucinations.  Safety maintained with 15 minute checks.

## 2020-06-25 NOTE — Progress Notes (Signed)
Pt reports feeling "down" because the social worker was "mean" to him earlier today when he asked about his discharge planning. He reports that the social worker told him to be "honest with yourself and come clean." He said that he felt like he was to blame for the situation he was in. He reports that the social worker told him to contact his father, but his father doesn't want him to come back. Pt said that he is interested in intensive outpatient treatment, [facility], or a 30 day program to stay sober. He is not sure about his current status with his discharge planning. Informed pt that this writer will update him once she reads the social workers notes. He did say that his celexa has been helping him and he feels more happy now. Pt denies SI/HI and AVH. Pt verbally agrees to notify staff immediately for any thoughts of hurting himself or anyone else. Active listening, reassurance, and support provided. Medications administered as ordered by MD. Q 15 min safety checks continue. Pt's safety has been maintained.   06/25/20 2030  Psych Admission Type (Psych Patients Only)  Admission Status Voluntary  Psychosocial Assessment  Patient Complaints Anxiety;Depression;Sadness;Worrying  Eye Contact Fair  Facial Expression Anxious  Affect Appropriate to circumstance  Speech Logical/coherent  Interaction Assertive  Motor Activity Slow  Appearance/Hygiene Unremarkable  Behavior Characteristics Cooperative;Anxious;Calm  Mood Depressed;Anxious;Sad  Thought Process  Coherency WDL  Content Blaming others  Delusions None reported or observed  Perception WDL  Hallucination None reported or observed  Judgment Impaired  Confusion None  Danger to Self  Current suicidal ideation? Denies  Self-Injurious Behavior No self-injurious ideation or behavior indicators observed or expressed   Agreement Not to Harm Self Yes  Description of Agreement verbally contracts for safety  Danger to Others  Danger to Others None  reported or observed

## 2020-06-25 NOTE — Tx Team (Signed)
Interdisciplinary Treatment and Diagnostic Plan Update  06/25/2020 Time of Session: [id number] [name] MRN: [id number]  Principal Diagnosis: MDD (major depressive disorder), recurrent, severe, with psychosis (HCC)  Secondary Diagnoses: Principal Problem:   MDD (major depressive disorder), recurrent, severe, with psychosis (HCC) Active Problems:   Hypertension   Post traumatic stress disorder (PTSD)   Cocaine dependence with cocaine-induced mood disorder (HCC)   Alcohol abuse with alcohol-induced mood disorder (HCC)   Cocaine abuse (HCC)   Class 2 obesity due to excess calories with body mass index (BMI) of 39.0 to 39.9 in adult   Bipolar 1 disorder (HCC)   Current Medications:  Current Facility-Administered Medications  Medication Dose Route Frequency Provider Last Rate Last Admin  . acetaminophen (TYLENOL) tablet 650 mg  650 mg Oral Q6H PRN Nira Conn A, NP   650 mg at 06/23/20 1741  . alum & mag hydroxide-simeth (MAALOX/MYLANTA) 200-200-20 MG/5ML suspension 30 mL  30 mL Oral Q4H PRN Nira Conn A, NP      . amLODipine (NORVASC) tablet 5 mg  5 mg Oral Daily Nira Conn A, NP   5 mg at 06/25/20 0751  . citalopram (CELEXA) tablet 20 mg  20 mg Oral Daily Dagar, Anjali, MD   20 mg at 06/25/20 0751  . magnesium hydroxide (MILK OF MAGNESIA) suspension 30 mL  30 mL Oral Daily PRN Nira Conn A, NP      . multivitamin with minerals tablet 1 tablet  1 tablet Oral Daily Dagar, Geralynn Rile, MD   1 tablet at 06/25/20 0751  . prazosin (MINIPRESS) capsule 1 mg  1 mg Oral QHS Dagar, Anjali, MD   1 mg at 06/24/20 2135  . QUEtiapine (SEROQUEL) tablet 300 mg  300 mg Oral QHS Dagar, Anjali, MD   300 mg at 06/24/20 2135  . thiamine tablet 100 mg  100 mg Oral Daily Dagar, Anjali, MD   100 mg at 06/25/20 0750   PTA Medications: Medications Prior to Admission  Medication Sig Dispense Refill Last Dose  . amLODipine (NORVASC) 5 MG tablet Take 1 tablet (5 mg total) by mouth daily. (Patient not taking:  Reported on 06/18/2020) 30 tablet 11     Patient Stressors: Financial difficulties Marital or family conflict  Patient Strengths: Active sense of humor Communication skills Motivation for treatment/growth  Treatment Modalities: Medication Management, Group therapy, Case management,  1 to 1 session with clinician, Psychoeducation, Recreational therapy.   Physician Treatment Plan for Primary Diagnosis: MDD (major depressive disorder), recurrent, severe, with psychosis (HCC) Long Term Goal(s): Improvement in symptoms so as ready for discharge Improvement in symptoms so as ready for discharge   Short Term Goals: Ability to disclose and discuss suicidal ideas Ability to demonstrate self-control will improve Compliance with prescribed medications will improve Ability to identify changes in lifestyle to reduce recurrence of condition will improve Ability to disclose and discuss suicidal ideas Ability to demonstrate self-control will improve Ability to identify triggers associated with substance abuse/mental health issues will improve  Medication Management: Evaluate patient's response, side effects, and tolerance of medication regimen.  Therapeutic Interventions: 1 to 1 sessions, Unit Group sessions and Medication administration.  Evaluation of Outcomes: Progressing  Physician Treatment Plan for Secondary Diagnosis: Principal Problem:   MDD (major depressive disorder), recurrent, severe, with psychosis (HCC) Active Problems:   Hypertension   Post traumatic stress disorder (PTSD)   Cocaine dependence with cocaine-induced mood disorder (HCC)   Alcohol abuse with alcohol-induced mood disorder (HCC)   Cocaine abuse (HCC)  Class 2 obesity due to excess calories with body mass index (BMI) of 39.0 to 39.9 in adult   Bipolar 1 disorder (HCC)  Long Term Goal(s): Improvement in symptoms so as ready for discharge Improvement in symptoms so as ready for discharge   Short Term Goals:  Ability to disclose and discuss suicidal ideas Ability to demonstrate self-control will improve Compliance with prescribed medications will improve Ability to identify changes in lifestyle to reduce recurrence of condition will improve Ability to disclose and discuss suicidal ideas Ability to demonstrate self-control will improve Ability to identify triggers associated with substance abuse/mental health issues will improve     Medication Management: Evaluate patient's response, side effects, and tolerance of medication regimen.  Therapeutic Interventions: 1 to 1 sessions, Unit Group sessions and Medication administration.  Evaluation of Outcomes: Progressing   RN Treatment Plan for Primary Diagnosis: MDD (major depressive disorder), recurrent, severe, with psychosis (HCC) Long Term Goal(s): Knowledge of disease and therapeutic regimen to maintain health will improve  Short Term Goals: Ability to remain free from injury will improve, Ability to verbalize frustration and anger appropriately will improve, Ability to demonstrate self-control, Ability to identify and develop effective coping behaviors will improve and Compliance with prescribed medications will improve  Medication Management: RN will administer medications as ordered by provider, will assess and evaluate patient's response and provide education to patient for prescribed medication. RN will report any adverse and/or side effects to prescribing provider.  Therapeutic Interventions: 1 on 1 counseling sessions, Psychoeducation, Medication administration, Evaluate responses to treatment, Monitor vital signs and CBGs as ordered, Perform/monitor CIWA, COWS, AIMS and Fall Risk screenings as ordered, Perform wound care treatments as ordered.  Evaluation of Outcomes: Progressing   LCSW Treatment Plan for Primary Diagnosis: MDD (major depressive disorder), recurrent, severe, with psychosis (HCC) Long Term Goal(s): Safe transition to  appropriate next level of care at discharge, Engage patient in therapeutic group addressing interpersonal concerns.  Short Term Goals: Engage patient in aftercare planning with referrals and resources, Increase social support, Increase ability to appropriately verbalize feelings, Increase emotional regulation and Increase skills for wellness and recovery  Therapeutic Interventions: Assess for all discharge needs, 1 to 1 time with Social worker, Explore available resources and support systems, Assess for adequacy in community support network, Educate family and significant other(s) on suicide prevention, Complete Psychosocial Assessment, Interpersonal group therapy.  Evaluation of Outcomes: Progressing   Progress in Treatment: Attending groups: Yes. and No. Participating in groups: Yes. and No. Taking medication as prescribed: Yes. Toleration medication: Yes. Family/Significant other contact made: Yes, individual(s) contacted:  father Patient understands diagnosis: Yes. Discussing patient identified problems/goals with staff: Yes. Medical problems stabilized or resolved: Yes. Denies suicidal/homicidal ideation: Yes. Issues/concerns per patient self-inventory: No. Other: None  New problem(s) identified: Yes, Describe:  Pt admitted for non-specfic SI and anxiety. Pt recently discharged from medical hospital for alcohol detox.   New Short Term/Long Term Goal(s): SW will provide Pt with community based resources to aid in substance abuse recovery and relapse prevention.  Patient Goals: "To get stabilized on the medications and find other treatment outside of here"  Discharge Plan or Barriers: SW will continue to assess.  Reason for Continuation of Hospitalization: Anxiety Depression Medication stabilization  Estimated Length of Stay: 1-3 days  Attendees: Patient:  06/25/2020 10:28 AM  Physician:  06/25/2020 10:28 AM  Nursing:  06/25/2020 10:28 AM  RN Care Manager: 06/25/2020 10:28  AM  Social Worker: Ruthann Cancer, LCSW 06/25/2020 10:28 AM  Recreational  Therapist:  06/25/2020 10:28 AM  Other:  06/25/2020 10:28 AM  Other:  06/25/2020 10:28 AM  Other: 06/25/2020 10:28 AM    Scribe for Treatment Team: Otelia Santee, LCSW 06/25/2020 10:28 AM

## 2020-06-26 DIAGNOSIS — F333 Major depressive disorder, recurrent, severe with psychotic symptoms: Secondary | ICD-10-CM | POA: Diagnosis not present

## 2020-06-26 MED ORDER — AMLODIPINE BESYLATE 5 MG PO TABS: 5.0000 mg | ORAL_TABLET | Freq: Every day | ORAL | 0 refills | Status: DC

## 2020-06-26 MED ORDER — QUETIAPINE FUMARATE 300 MG PO TABS
300.0000 mg | ORAL_TABLET | Freq: Every day | ORAL | 0 refills | Status: DC
Start: 2020-06-26 — End: 2020-09-05

## 2020-06-26 MED ORDER — PRAZOSIN HCL 1 MG PO CAPS
1.0000 mg | ORAL_CAPSULE | Freq: Every day | ORAL | 0 refills | Status: DC
Start: 2020-06-26 — End: 2020-09-05

## 2020-06-26 MED ORDER — HYDROXYZINE HCL 50 MG PO TABS
50.0000 mg | ORAL_TABLET | Freq: Three times a day (TID) | ORAL | 0 refills | Status: DC | PRN
Start: 2020-06-26 — End: 2020-09-05

## 2020-06-26 MED ORDER — CITALOPRAM HYDROBROMIDE 20 MG PO TABS
20.0000 mg | ORAL_TABLET | Freq: Every day | ORAL | 0 refills | Status: DC
Start: 2020-06-27 — End: 2020-09-05

## 2020-06-26 NOTE — BHH Counselor (Signed)
CSW received a call from [place] at [facility] [phone], and learned that the pt has been accepted to their PHP with housing program. [facility] stated they could pay for a ticket with the [employer] bus service for him to be transported to [place] and they would pick him up from there. CSW confirmed this d/c plan with other social workers, pt and doctosr and let the facility know to purchase the ticket. CSW completed a taxi voucher with instructions for a ride to be called for the pt at 6am for pickup at 7am by [employer] [phone] and to have the pt d/c'ed by 7am. CSW received the bus ticket confirmation number from [facility] and gave it to the pt. CSW instructed pt to enter the number at the station to print his ticket. Pt stated he understood these instructions.   Fredirick Lathe, LCSWA Clinicial Social Worker [employer]

## 2020-06-26 NOTE — BHH Group Notes (Signed)
BHH Group Notes:  (Nursing/MHT/Case Management/Adjunct)  Date:  06/26/2020  Time:  9:15 PM  Type of Therapy:  wrap up group  Participation Level:  Active  Participation Quality:  Appropriate  Affect:  Anxious and Flat  Cognitive:  Appropriate  Insight:  Good  Engagement in Group:  Engaged  Modes of Intervention:  Discussion  Summary of Progress/Problems: pt stated he was ready for D/C tomorrow to [facility]  Jacques Navy A 06/26/2020, 9:15 PM

## 2020-06-26 NOTE — Progress Notes (Signed)
°  [facility] Adult Case Management Discharge Plan :  Will you be returning to the same living situation after discharge:  No.Pt will go to [facility] for a partial hospitalization program.  At discharge, do you have transportation home?: Yes,  taxi service has been arranged to [employer] where pt has a ticket to [employer] and [employer] will transport him to the facility Do you have the ability to pay for your medications: Yes,  has medicare  Release of information consent forms completed and in the chart;  Patient's signature needed at discharge.  Patient to Follow up at:  Follow-up Information    [facility] Follow up.   Specialty: Addiction Medicine Why: A referral has been made to this provider for residential treatment services.  Contact information: [address] [place] [address] [phone]        [facility] Follow up.   Why: A referral has been made to this provider for residential treatment services.  Contact information: [address] [place] [address] [phone]               Next level of care provider has access to [facility] Link:no  Safety Planning and Suicide Prevention discussed: Yes,  with father Bianca Vester [phone])     Has patient been referred to the Quitline?: Patient refused referral  Patient has been referred for addiction treatment: Yes, [facility] and [facility]  Felizardo Hoffmann, Theresia Majors 06/26/2020, 4:22 PM

## 2020-06-26 NOTE — Progress Notes (Signed)
Patient ID: [name], male   DOB: 1971/08/10, 49 y.o.   MRN: [id number]  [facility] MD Progress Note  06/26/2020 2:30 PM [name]  MRN:  [id number]   Daily note: Patient seen, chart reviewed, case discussed with treatment team.  Patient reports mood improving although still remains somewhat anxious regarding his placement.  Patient continues to have phone interviews for substance abuse rehab placement.  Regarding his mood, patient states that the increase in hydroxyzine has been helpful for him.  He also notices that his mood is continuing to improve on his current regimen.  He denies any adverse effects.  Principal Problem: MDD (major depressive disorder), recurrent, severe, with psychosis (HCC) Diagnosis: Principal Problem:   MDD (major depressive disorder), recurrent, severe, with psychosis (HCC) Active Problems:   Hypertension   Post traumatic stress disorder (PTSD)   Cocaine dependence with cocaine-induced mood disorder (HCC)   Alcohol abuse with alcohol-induced mood disorder (HCC)   Cocaine abuse (HCC)   Class 2 obesity due to excess calories with body mass index (BMI) of 39.0 to 39.9 in adult   Bipolar 1 disorder (HCC)  Total Time spent with patient: 15 minutes  Past Psychiatric History:  Past Medical History:  Past Medical History:  Diagnosis Date  . Alcohol dependence with withdrawal (HCC)   . Anxiety   . Asthma   . Atrial fibrillation (HCC)    not on AC  . Bipolar 1 disorder (HCC)   . Class 2 obesity   . Cocaine abuse (HCC)   . Depression   . Heart palpitations    with severe anxiety  . Hypertension   . PTSD (post-traumatic stress disorder)   . PTSD (post-traumatic stress disorder)     Past Surgical History:  Procedure Laterality Date  . NO PAST SURGERIES     Family History:  Family History  Problem Relation Age of Onset  . Alcohol abuse Mother   . Hypertension Father   . Dementia Father   . Alcohol abuse Maternal Grandfather    Family Psychiatric   History:  Social History:  Social History   Substance and Sexual Activity  Alcohol Use Yes   Comment: reports that his last drink was last Wednesday when he had 4- 24oz of liquor, said that 4 days out of the wk he drinks 4-24 ounce beers     Social History   Substance and Sexual Activity  Drug Use Yes  . Types: Cocaine   Comment: last use was "last Wednesday" and said that he uses a "minimal" amount, 1st time he used it since 2016    Social History   Socioeconomic History  . Marital status: Single    Spouse name: Not on file  . Number of children: Not on file  . Years of education: Not on file  . Highest education level: Not on file  Occupational History  . Occupation: disabled  Tobacco Use  . Smoking status: Never Smoker  . Smokeless tobacco: Never Used  Vaping Use  . Vaping Use: Never used  Substance and Sexual Activity  . Alcohol use: Yes    Comment: reports that his last drink was last Wednesday when he had 4- 24oz of liquor, said that 4 days out of the wk he drinks 4-24 ounce beers  . Drug use: Yes    Types: Cocaine    Comment: last use was "last Wednesday" and said that he uses a "minimal" amount, 1st time he used it since 2016  .  Sexual activity: Not Currently    Comment: pt said he has an enlarged prostate  Other Topics Concern  . Not on file  Social History Narrative   ** Merged History Encounter **       Social Determinants of Health   Financial Resource Strain:   . Difficulty of Paying Living Expenses: Not on file  Food Insecurity:   . Worried About Programme researcher, broadcasting/film/video in the Last Year: Not on file  . Ran Out of Food in the Last Year: Not on file  Transportation Needs:   . Lack of Transportation (Medical): Not on file  . Lack of Transportation (Non-Medical): Not on file  Physical Activity:   . Days of Exercise per Week: Not on file  . Minutes of Exercise per Session: Not on file  Stress:   . Feeling of Stress : Not on file  Social Connections:    . Frequency of Communication with Friends and Family: Not on file  . Frequency of Social Gatherings with Friends and Family: Not on file  . Attends Religious Services: Not on file  . Active Member of Clubs or Organizations: Not on file  . Attends Banker Meetings: Not on file  . Marital Status: Not on file   Additional Social History:                         Sleep: Poor  Appetite:  Poor  Current Medications: Current Facility-Administered Medications  Medication Dose Route Frequency Provider Last Rate Last Admin  . acetaminophen (TYLENOL) tablet 650 mg  650 mg Oral Q6H PRN Nira Conn A, NP   650 mg at 06/25/20 1636  . alum & mag hydroxide-simeth (MAALOX/MYLANTA) 200-200-20 MG/5ML suspension 30 mL  30 mL Oral Q4H PRN Nira Conn A, NP      . amLODipine (NORVASC) tablet 5 mg  5 mg Oral Daily Nira Conn A, NP   5 mg at 06/26/20 0750  . citalopram (CELEXA) tablet 20 mg  20 mg Oral Daily Dagar, Anjali, MD   20 mg at 06/26/20 0750  . hydrOXYzine (ATARAX/VISTARIL) tablet 50 mg  50 mg Oral TID PRN Clement Sayres, MD   50 mg at 06/26/20 0450  . magnesium hydroxide (MILK OF MAGNESIA) suspension 30 mL  30 mL Oral Daily PRN Nira Conn A, NP      . multivitamin with minerals tablet 1 tablet  1 tablet Oral Daily Dagar, Geralynn Rile, MD   1 tablet at 06/26/20 0750  . prazosin (MINIPRESS) capsule 1 mg  1 mg Oral QHS Dagar, Anjali, MD   1 mg at 06/25/20 2100  . QUEtiapine (SEROQUEL) tablet 300 mg  300 mg Oral QHS Dagar, Anjali, MD   300 mg at 06/25/20 2100  . thiamine tablet 100 mg  100 mg Oral Daily Dagar, Anjali, MD   100 mg at 06/26/20 0750    Lab Results: No results found for this or any previous visit (from the past 48 hour(s)).  Blood Alcohol level:  Lab Results  Component Value Date   ETH <10 06/17/2020   ETH 43 (H) 06/14/2020    Metabolic Disorder Labs: Lab Results  Component Value Date   HGBA1C 6.0 (H) 06/20/2020   MPG 126 06/20/2020   No results found  for: PROLACTIN Lab Results  Component Value Date   CHOL 130 06/19/2020   TRIG 67 06/19/2020   HDL 27 (L) 06/19/2020   CHOLHDL 4.8 06/19/2020  VLDL 13 06/19/2020   LDLCALC 90 06/19/2020    Physical Findings: AIMS: Facial and Oral Movements Muscles of Facial Expression: None, normal Lips and Perioral Area: None, normal Jaw: None, normal Tongue: None, normal,Extremity Movements Upper (arms, wrists, hands, fingers): None, normal Lower (legs, knees, ankles, toes): None, normal, Trunk Movements Neck, shoulders, hips: None, normal, Overall Severity Severity of abnormal movements (highest score from questions above): None, normal Incapacitation due to abnormal movements: None, normal Patient's awareness of abnormal movements (rate only patient's report): No Awareness, Dental Status Current problems with teeth and/or dentures?: No Does patient usually wear dentures?: No  CIWA:  CIWA-Ar Total: 1 COWS:     Musculoskeletal: Strength & Muscle Tone: within normal limits Gait & Station: normal Patient leans: N/A  Psychiatric Specialty Exam: Physical Exam Vitals reviewed.  Constitutional:      Appearance: He is obese.  Neurological:     Mental Status: He is alert and oriented to person, place, and time.  Psychiatric:        Mood and Affect: Mood normal.        Thought Content: Thought content normal. Thought content does not include suicidal ideation. Thought content does not include suicidal plan.        Cognition and Memory: Cognition normal.     Review of Systems  Constitutional: Positive for fatigue.  Psychiatric/Behavioral: Positive for decreased concentration, dysphoric mood and hallucinations. Negative for suicidal ideas.  All other systems reviewed and are negative.   Blood pressure 131/79, pulse 92, temperature 98.6 F (37 C), temperature source Oral, resp. rate 18, height 5\' 7"  (1.702 m), weight 126.1 kg, SpO2 97 %.Body mass index is 43.54 kg/m.  General Appearance:  Casual  Eye Contact:  Fair  Speech:  Clear and Coherent  Volume:  Normal  Mood:  Anxious and Depressed  Affect:  Appropriate  Thought Process:  Coherent  Orientation:  Full (Time, Place, and Person)  Thought Content:  Logical and Rumination  Suicidal Thoughts:  No  Homicidal Thoughts:  No  Memory:  Immediate;   Fair Remote;   Fair  Judgement:  Fair  Insight:  Fair  Psychomotor Activity:  Normal  Concentration:  Concentration: Fair  Recall:  of Knowledge:  Fair  Language:  Fair  Akathisia:  No  Handed:  Right  AIMS (if indicated):     Assets:  Communication Skills Desire for Improvement Social Support  ADL's:  Intact  Cognition:  WNL  Sleep:  Number of Hours: 5.75     Treatment Plan Summary: Daily contact with patient to assess and evaluate symptoms and progress in treatment and Medication management   49 year old male with a diagnosis of bipolar disorder currently being stabilized with medication management awaiting placement.  Medication management:  Continues to Celexa 20 mg p.o. daily for depression/anxiety Continue Seroquel 300 mg p.o. nightly for mood stabilization Vistaril 50 mg p.o. 3 times daily as needed anxiety.  CSW to continue working on discharge disposition Patient encouraged to participate within the therapeutic milieu  53, MD 06/26/2020, 2:30 PM

## 2020-06-26 NOTE — Progress Notes (Signed)
   06/26/20 0608  Vital Signs  Pulse Rate 92  BP 131/79  BP Location Right Arm  BP Method Automatic  Patient Position (if appropriate) Standing   D: Pt. Admitted to some passive SI, but denied HI/AVH. Patient rated anxiety 8/10 and depression 7/10. Pt. Reported that he needed to make calls in the morning. Pt. Attended group in the morning.  A:  Patient took scheduled medicine.  Pt. Could not get a second dose of vistaril form anxiety because it was too close to the first dose. Pt. Was ok with this. Support and encouragement provided Routine safety checks conducted every 15 minutes. Patient  Informed to notify staff with any concerns.   R:  Safety maintained.

## 2020-06-26 NOTE — BHH Suicide Risk Assessment (Signed)
[facility] Discharge Suicide Risk Assessment   Principal Problem: MDD (major depressive disorder), recurrent, severe, with psychosis (HCC) Discharge Diagnoses: Principal Problem:   MDD (major depressive disorder), recurrent, severe, with psychosis (HCC) Active Problems:   Hypertension   Post traumatic stress disorder (PTSD)   Cocaine dependence with cocaine-induced mood disorder (HCC)   Alcohol abuse with alcohol-induced mood disorder (HCC)   Cocaine abuse (HCC)   Class 2 obesity due to excess calories with body mass index (BMI) of 39.0 to 39.9 in adult   Bipolar 1 disorder (HCC)   Total Time spent with patient: 15 minutes  Musculoskeletal: Strength & Muscle Tone: within normal limits Gait & Station: normal Patient leans: N/A  Psychiatric Specialty Exam: Review of Systems  All other systems reviewed and are negative.   Blood pressure 131/79, pulse 92, temperature 98.6 F (37 C), temperature source Oral, resp. rate 18, height 5\' 7"  (1.702 m), weight 126.1 kg, SpO2 97 %.Body mass index is 43.54 kg/m.  General Appearance: Casual  Eye Contact::  Fair  Speech:  Normal Rate409  Volume:  Normal  Mood:  Anxious  Affect:  Congruent  Thought Process:  Coherent and Descriptions of Associations: Intact  Orientation:  Full (Time, Place, and Person)  Thought Content:  Logical  Suicidal Thoughts:  No  Homicidal Thoughts:  No  Memory:  Immediate;   Fair Recent;   Fair Remote;   Fair  Judgement:  Intact  Insight:  Fair  Psychomotor Activity:  Normal  Concentration:  Fair  Recall:  [id number] of Knowledge:Fair  Language: Good  Akathisia:  Negative  Handed:  Right  AIMS (if indicated):     Assets:  Desire for Improvement Resilience  Sleep:  Number of Hours: 5.75  Cognition: WNL  ADL's:  Intact   Mental Status Per Nursing Assessment::   On Admission:  Suicidal ideation indicated by patient  Demographic Factors:  Male, Low socioeconomic status and Unemployed  Loss  Factors: Financial problems/change in socioeconomic status  Historical Factors: Impulsivity  Risk Reduction Factors:   NA  Continued Clinical Symptoms:  Depression:   Comorbid alcohol abuse/dependence Impulsivity Alcohol/Substance Abuse/Dependencies  Cognitive Features That Contribute To Risk:  None    Suicide Risk:  Minimal: No identifiable suicidal ideation.  Patients presenting with no risk factors but with morbid ruminations; may be classified as minimal risk based on the severity of the depressive symptoms   Follow-up Information    [facility] Follow up.   Specialty: Addiction Medicine Why: A referral has been made to this provider for residential treatment services.  Contact information: [address] [place] [phone]        [facility] Follow up.   Why: A referral has been made to this provider for residential treatment services.  Contact information: [address] [place] [phone]               Plan Of Care/Follow-up recommendations:  Activity:  ad lib  [phone], MD 06/26/2020, 3:57 PM

## 2020-06-26 NOTE — BHH Counselor (Addendum)
CSW spoke with the pt about his referrals to [facility] and [employer]. Pt stated that he had completed assessment with [facility] but that he had not completed the pre-screen with [employer]. Pt shared that he had called and started the pre-screen with [facility] but then it was time to go to lunch so I told them I would call them back. CSW asked if he had called and finished his pre-screen and he stated he had not. CSW encouraged pt to call now since he had time but pt returned to the day room and did not call at this time.   CSW contacted [facility] and learned that they are requesting more paperwork due "the patient being more mental health than substance use" (quote from [facility]). CSW faxed [facility] more progress notes and will continue to call and follow-up.   CSW contacted [facility] to inquire on the status of his admission. CSW learned that part of the admission schedule has been completed but that he needs to finish this with Trinna Post. CSW asked when this could be finished as pt discharges tomorrow. CSW was informed that Trinna Post at [facility] will call her back with an update as soon as he gets back to his desk.  Fredirick Lathe, LCSWA Clinicial Social Worker [employer]

## 2020-06-26 NOTE — BHH Counselor (Signed)
FOLLOW-UP: 06/26/20 8:45am CSW asked pt if he had called [facility] or [facility] to complete his assessments. Pt stated that [facility] asked him to call back at 9am and he left a message for [facility] in admissions at [facility]. CSW encouraged pt to call these places again and let CSW known when the assessments were completed. Pt agreed.   Fredirick Lathe, LCSWA Clinicial Social Worker [employer]

## 2020-06-26 NOTE — BHH Counselor (Signed)
CSW contacted [employer] to inquire on the status of admission. CSW learned that [facility] had not been able to get in contact with the pt to complete the pre-screen. CSW asked if there was a direct number that we could have to contact the individual who is reviewing his admission. CSW received contact information for Trinna Post and gave it to the pt. CSW encouraged pt to go ahead and call [facility] so that a bed could be secured.   Fredirick Lathe, LCSWA Clinicial Social Worker [employer]

## 2020-06-26 NOTE — Discharge Summary (Signed)
Physician Discharge Summary Note  Patient:  [name] is an 49 y.o., male MRN:  [id number] DOB:  [birth year] Patient phone:  [phone] (home)  Patient address:   [address] [place] [facility] [address],  Total Time spent with patient: 15 minutes  Date of Admission:  06/20/2020 Date of Discharge: 06/27/2020  Reason for Admission:  suicidal ideation to cut himself  Principal Problem: MDD (major depressive disorder), recurrent, severe, with psychosis (HCC) Discharge Diagnoses: Principal Problem:   MDD (major depressive disorder), recurrent, severe, with psychosis (HCC) Active Problems:   Hypertension   Post traumatic stress disorder (PTSD)   Cocaine dependence with cocaine-induced mood disorder (HCC)   Alcohol abuse with alcohol-induced mood disorder (HCC)   Cocaine abuse (HCC)   Class 2 obesity due to excess calories with body mass index (BMI) of 39.0 to 39.9 in adult   Bipolar 1 disorder (HCC)   Past Psychiatric History: Patient reports major depressive disorder with psychosis, Bipolar 1, alcohol dependence, cocaine dependence. He states his last psychiatric hospitalization was in 2016 after he relapsed on alcohol and cocaine due to his mother passing away. He states he was in [facility] for alcohol abuse in 2014. He states he just got admitted to hospital before this psychiatric hospitalization for alcohol withdrawal.   Past Medical History:  Past Medical History:  Diagnosis Date  . Alcohol dependence with withdrawal (HCC)   . Anxiety   . Asthma   . Atrial fibrillation (HCC)    not on AC  . Bipolar 1 disorder (HCC)   . Class 2 obesity   . Cocaine abuse (HCC)   . Depression   . Heart palpitations    with severe anxiety  . Hypertension   . PTSD (post-traumatic stress disorder)   . PTSD (post-traumatic stress disorder)     Past Surgical History:  Procedure Laterality Date  . NO PAST SURGERIES     Family History:  Family History  Problem Relation Age of Onset   . Alcohol abuse Mother   . Hypertension Father   . Dementia Father   . Alcohol abuse Maternal Grandfather    Family Psychiatric  History: Mother with alcohol use disorder Social History:  Social History   Substance and Sexual Activity  Alcohol Use Yes   Comment: reports that his last drink was last Wednesday when he had 4- 24oz of liquor, said that 4 days out of the wk he drinks 4-24 ounce beers     Social History   Substance and Sexual Activity  Drug Use Yes  . Types: Cocaine   Comment: last use was "last Wednesday" and said that he uses a "minimal" amount, 1st time he used it since 2016    Social History   Socioeconomic History  . Marital status: Single    Spouse name: Not on file  . Number of children: Not on file  . Years of education: Not on file  . Highest education level: Not on file  Occupational History  . Occupation: disabled  Tobacco Use  . Smoking status: Never Smoker  . Smokeless tobacco: Never Used  Vaping Use  . Vaping Use: Never used  Substance and Sexual Activity  . Alcohol use: Yes    Comment: reports that his last drink was last Wednesday when he had 4- 24oz of liquor, said that 4 days out of the wk he drinks 4-24 ounce beers  . Drug use: Yes    Types: Cocaine    Comment: last use was "last  Wednesday" and said that he uses a "minimal" amount, 1st time he used it since 12/27/14  . Sexual activity: Not Currently    Comment: pt said he has an enlarged prostate  Other Topics Concern  . Not on file  Social History Narrative   ** Merged History Encounter **       Social Determinants of Health   Financial Resource Strain:   . Difficulty of Paying Living Expenses: Not on file  Food Insecurity:   . Worried About Programme researcher, broadcasting/film/video in the Last Year: Not on file  . Ran Out of Food in the Last Year: Not on file  Transportation Needs:   . Lack of Transportation (Medical): Not on file  . Lack of Transportation (Non-Medical): Not on file  Physical  Activity:   . Days of Exercise per Week: Not on file  . Minutes of Exercise per Session: Not on file  Stress:   . Feeling of Stress : Not on file  Social Connections:   . Frequency of Communication with Friends and Family: Not on file  . Frequency of Social Gatherings with Friends and Family: Not on file  . Attends Religious Services: Not on file  . Active Member of Clubs or Organizations: Not on file  . Attends Banker Meetings: Not on file  . Marital Status: Not on file    Hospital Course:  From admission H&P: [name] is a 49 yo M who presented voluntarily on 06/18/2020 to [facility] for chest pain, worsening depression and suicidal thoughts with a plan to cut himself. He was discharged on 06/17/2020 from [facility] after ETOH detox. He was admitted to [facility] on 06/20/2020 for ongoing suicidal ideations and depression. Today he admits to suicidal thoughts with plan to cut himself " Just want to end this worthless life". He endorses feeling depressed, worthless, feeling guilty of being 49 years old and not done anything with his life, recurrent suicidal thoughts, insomnia- able to get 3-4 hours of sleep, decreased appetite, hopeless, anhedonia, low energy and low concentration since 12-26-2017 but got worsened when his father asked him to leave his house and he spent 24 hours on [address]. He also endorses anxiety symptoms like worrying a lot, having constant thoughts when trying to sleep, panic attacks frequently. He identifies couple of stressors for recent worsening depression and suicidal thoughts. He states 1. Non-compliance to his medications- He was prescribed Celexa in Jan 2021 but he took 2-3 pills and never took them again. He is treating himself with alcohol and cocaine. 2. He states he is still grieving from his mother's death from 12-27-14 , he identified as the only person who was his support. 3. Recent homelessness and unable to pay for his medications/ expenses as father is  the payee. He complains of auditory hallucinations, last heard was last evening- telling him to end his life by cutting himself. He also complains of visual hallucinations - seeing different shapes and objects, tries to make them go away by blinking but they are there all the time. He admits to self cutting and multiple suicide attempts between age of [age]. He has scar marks on his both arms. He admits to drinking alcohol everyday, last drink was 2 days ago 3 beers but he was discharged from another hospital 3 days ago. He admits to doing cocaine 2 days ago as well. He denies smoking cigarettes, marijuana or any other drugs.   Mr. Collister was admitted for  polysubstance abuse (opioids, BZDs, ETOH, cocaine) with suicidal ideation. He reported AVH on admission. He remained on the [facility] unit for seven days. CIWA protocol was started with Ativan PRN CIWA>10 for ETOH and BZD withdrawal. He was started on Celexa, Seroquel, Minipress, and PRN Vistaril. He participated in group therapy on the unit. He responded well to treatment with no adverse effects reported. He has shown improved mood, affect, sleep, and interaction. He requested referrals to rehab and has been accepted to [facility]. He denies any SI/HI/AVH and contracts for safety. He is discharging on the medications listed below. He agrees to follow up at [facility] (see below). Patient is provided with prescriptions for medications upon discharge. He is discharging to [facility] via facility-arranged transport.  Physical Findings: AIMS: Facial and Oral Movements Muscles of Facial Expression: None, normal Lips and Perioral Area: None, normal Jaw: None, normal Tongue: None, normal,Extremity Movements Upper (arms, wrists, hands, fingers): None, normal Lower (legs, knees, ankles, toes): None, normal, Trunk Movements Neck, shoulders, hips: None, normal, Overall Severity Severity of abnormal movements (highest score from questions  above): None, normal Incapacitation due to abnormal movements: None, normal Patient's awareness of abnormal movements (rate only patient's report): No Awareness, Dental Status Current problems with teeth and/or dentures?: No Does patient usually wear dentures?: No  CIWA:  CIWA-Ar Total: 1 COWS:     Musculoskeletal: Strength & Muscle Tone: within normal limits Gait & Station: normal Patient leans: N/A  Psychiatric Specialty Exam: Physical Exam Vitals and nursing note reviewed.  Constitutional:      Appearance: He is well-developed.  Cardiovascular:     Rate and Rhythm: Normal rate.  Pulmonary:     Effort: Pulmonary effort is normal.  Neurological:     Mental Status: He is alert and oriented to person, place, and time.     Review of Systems  Constitutional: Negative.   Respiratory: Negative for cough and shortness of breath.   Psychiatric/Behavioral: Negative for agitation, behavioral problems, confusion, decreased concentration, dysphoric mood, hallucinations, self-injury, sleep disturbance and suicidal ideas. The patient is not nervous/anxious and is not hyperactive.     Blood pressure 131/79, pulse 92, temperature 98.6 F (37 C), temperature source Oral, resp. rate 18, height 5\' 7"  (1.702 m), weight 126.1 kg, SpO2 97 %.Body mass index is 43.54 kg/m.  See MD's discharge SRA      Has this patient used any form of tobacco in the last 30 days? (Cigarettes, Smokeless Tobacco, Cigars, and/or Pipes)  No  Blood Alcohol level:  Lab Results  Component Value Date   ETH <10 06/17/2020   ETH 43 (H) 06/14/2020    Metabolic Disorder Labs:  Lab Results  Component Value Date   HGBA1C 6.0 (H) 06/20/2020   MPG 126 06/20/2020   No results found for: PROLACTIN Lab Results  Component Value Date   CHOL 130 06/19/2020   TRIG 67 06/19/2020   HDL 27 (L) 06/19/2020   CHOLHDL 4.8 06/19/2020   VLDL 13 06/19/2020   LDLCALC 90 06/19/2020    See Psychiatric Specialty Exam and Suicide  Risk Assessment completed by Attending Physician prior to discharge.  Discharge destination:  Other:  [facility]  Is patient on multiple antipsychotic therapies at discharge:  No   Has Patient had three or more failed trials of antipsychotic monotherapy by history:  No  Recommended Plan for Multiple Antipsychotic Therapies: NA  Discharge Instructions    Diet - low sodium heart healthy   Complete by: As  directed    Discharge instructions   Complete by: As directed    Activity as tolerated. Diet as recommended by primary care physician. Keep all scheduled follow-up appointments as recommended.   Increase activity slowly   Complete by: As directed      Allergies as of 06/26/2020   No Known Allergies     Medication List    TAKE these medications     Indication  amLODipine 5 MG tablet Commonly known as: NORVASC Take 1 tablet (5 mg total) by mouth daily.  Indication: High Blood Pressure Disorder   citalopram 20 MG tablet Commonly known as: CELEXA Take 1 tablet (20 mg total) by mouth daily. Start taking on: June 27, 2020  Indication: Depression   hydrOXYzine 50 MG tablet Commonly known as: ATARAX/VISTARIL Take 1 tablet (50 mg total) by mouth 3 (three) times daily as needed for anxiety.  Indication: Feeling Anxious   prazosin 1 MG capsule Commonly known as: MINIPRESS Take 1 capsule (1 mg total) by mouth at bedtime.  Indication: Frightening Dreams   QUEtiapine 300 MG tablet Commonly known as: SEROQUEL Take 1 tablet (300 mg total) by mouth at bedtime.  Indication: Depressive Phase of Manic-Depression, Auditory Hallucinations       Follow-up Information    [facility] Follow up.   Specialty: Addiction Medicine Why: A referral has been made to this provider for residential treatment services.  Contact information: [name] [place] [address] [phone]        [facility] Follow  up.   Why: A referral has been made to this provider for residential treatment services.  Contact information: [address] [place] [address] [phone]               Follow-up recommendations: Activity as tolerated. Diet as recommended by primary care physician. Keep all scheduled follow-up appointments as recommended.   Comments:   Patient is instructed to take all prescribed medications as recommended. Report any side effects or adverse reactions to your outpatient psychiatrist. Patient is instructed to abstain from alcohol and illegal drugs while on prescription medications. In the event of worsening symptoms, patient is instructed to call the crisis hotline, 911, or go to the nearest emergency department for evaluation and treatment.  Signed: Aldean BakerJanet E Royann Wildasin, NP 06/26/2020, 7:13 PM

## 2020-06-26 NOTE — Progress Notes (Signed)
   06/26/20 2025  COVID-19 Daily Checkoff  Have you had a fever (temp > 37.80C/100F)  in the past 24 hours?  No  COVID-19 EXPOSURE  Have you traveled outside the state in the past 14 days? No  Have you been in contact with someone with a confirmed diagnosis of COVID-19 or PUI in the past 14 days without wearing appropriate PPE? No  Have you been living in the same home as a person with confirmed diagnosis of COVID-19 or a PUI (household contact)? No  Have you been diagnosed with COVID-19? No

## 2020-06-26 NOTE — Progress Notes (Signed)
Pt is awake and c/o feeling anxious and depressed in regards to his discharge planning. Pt has been administered 50 mg of his PRN vistaril at 0450. Pt is worried because he said that he will not have a place to stay if he doesn't get accepted to a rehab center. This Clinical research associate informed pt about social workers note and his need to contact [facility] and [facility] to complete his prescreen. Informed pt that this is how an admission date will be obtained. Provided pt both numbers and he said that he will contact them at 6 AM.

## 2020-06-27 DIAGNOSIS — F333 Major depressive disorder, recurrent, severe with psychotic symptoms: Secondary | ICD-10-CM | POA: Diagnosis not present

## 2020-06-27 NOTE — Progress Notes (Signed)
Pt has been active in the dayroom tonight. He was seen pleasantly interacting with another pt. Pt reports feeling "much better" today and looking forward to discharging tomorrow to [facility] treatment center. Pt has a positive mindset to stay away from alcohol and other drugs. Pt denies SI/HI and AVH. Active listening, reassurance, and support provided. Medications administered as ordered by MD. Q 15 min safety checks continue. Pt's safety has been maintained.   06/26/20 2025  Psych Admission Type (Psych Patients Only)  Admission Status Voluntary  Psychosocial Assessment  Patient Complaints Anxiety  Eye Contact Fair  Facial Expression Anxious  Affect Appropriate to circumstance  Speech Logical/coherent  Interaction Assertive  Motor Activity Slow  Appearance/Hygiene Unremarkable  Behavior Characteristics Cooperative;Anxious;Calm  Mood Anxious;Pleasant  Thought Process  Coherency WDL  Content WDL  Delusions None reported or observed  Perception WDL  Hallucination None reported or observed  Judgment Poor  Confusion None  Danger to Self  Current suicidal ideation? Denies  Self-Injurious Behavior No self-injurious ideation or behavior indicators observed or expressed   Agreement Not to Harm Self Yes  Description of Agreement verbally contracts for safety  Danger to Others  Danger to Others None reported or observed

## 2020-06-27 NOTE — Progress Notes (Signed)
°  [facility] Adult Case Management Discharge Plan :  Will you be returning to the same living situation after discharge:  No. At discharge, do you have transportation home?: Yes,  safe transport arranged. Do you have the ability to pay for your medications: Yes,  Pt is insured.  Release of information consent forms completed and in the chart;  Patient's signature needed at discharge.  Patient to Follow up at:  Follow-up Information    [facility] Follow up.   Specialty: Addiction Medicine Why: A referral has been made to this provider for residential treatment services.  Contact information: [address] [place] [address] [phone]        [facility] Follow up.   Why: A referral has been made to this provider for residential treatment services.  Contact information: [address] [place] [address] [phone]               Next level of care provider has access to [facility] Link:no  Safety Planning and Suicide Prevention discussed: Yes,  completed with Pt and Pt's father.     Has patient been referred to the Quitline?: N/A patient is not a smoker  Patient has been referred for addiction treatment: Yes  Jacinta Shoe, LCSW 06/27/2020, 8:36 AM

## 2020-06-27 NOTE — Progress Notes (Addendum)
Pt provided AVS discharge packet after discussing follow-up appointments and medications to be continued upon discharge. Educated about potential adverse effects that need to be reported to provider ASAP. Also provided his medication scripts. Pt also educated about not drinking ETOH or illegal drugs while taking prescription medications. Pt has been provided suicide risk assessment completed by MD and transition record with lab results. Provided opportunity to ask any questions. Pt has gathered belongings from his room. Pt has been provided resources/contact numbers to reach out to for suicidal thoughts and mental health related needs.Pt denies SI/HI and AVH.

## 2020-06-27 NOTE — Progress Notes (Addendum)
Pt discharged with [facility] and his belongings were returned from his assigned locker.

## 2020-06-27 NOTE — Progress Notes (Addendum)
This Clinical research associate called [employer] at 6 AM to set up a taxi to transport this pt to [facility] at 7 AM. This Clinical research associate was informed that for vouchers you must call between the hours of 8 AM to 5 PM. This Clinical research associate informed taxi service that a voucher on our end has already been filled out and the Child psychotherapist has already made the arrangement. This Clinical research associate also asked if there was anyway she could provide confirmation. She said they still have to fill one out on their end. She said to call between the hours of 8 AM to 5 PM. Informed AC, Fransico Michael who has arranged transportation via [employer]

## 2020-08-20 ENCOUNTER — Encounter (HOSPITAL_COMMUNITY): Payer: Self-pay

## 2020-08-20 ENCOUNTER — Emergency Department (HOSPITAL_COMMUNITY)
Admission: EM | Admit: 2020-08-20 | Discharge: 2020-08-20 | Disposition: A | Discharge status: Home / Self Care | Payer: Medicare Other | Source: Home / Self Care | Attending: Emergency Medicine | Admitting: Emergency Medicine

## 2020-08-20 DIAGNOSIS — F1410 Cocaine abuse, uncomplicated: Secondary | ICD-10-CM | POA: Insufficient documentation

## 2020-08-20 DIAGNOSIS — J45909 Unspecified asthma, uncomplicated: Secondary | ICD-10-CM | POA: Insufficient documentation

## 2020-08-20 DIAGNOSIS — F10239 Alcohol dependence with withdrawal, unspecified: Secondary | ICD-10-CM | POA: Diagnosis not present

## 2020-08-20 DIAGNOSIS — F1010 Alcohol abuse, uncomplicated: Secondary | ICD-10-CM

## 2020-08-20 DIAGNOSIS — Z79899 Other long term (current) drug therapy: Secondary | ICD-10-CM | POA: Insufficient documentation

## 2020-08-20 DIAGNOSIS — I10 Essential (primary) hypertension: Secondary | ICD-10-CM | POA: Insufficient documentation

## 2020-08-20 NOTE — ED Triage Notes (Addendum)
Pt arrived via EMS, called for drug use, stated to EMS he used to much cocaine. Recently out of rehab yesterday for ETOH. States a couple beers and coke use this morning. Denies any SI/HI  148/104 18 RR 95% RA 100's HR

## 2020-08-20 NOTE — ED Provider Notes (Signed)
[facility] EMERGENCY DEPARTMENT Provider Note  CSN: [id number] Arrival date & time: 08/20/20 1006    History Chief Complaint  Patient presents with  . Alcohol Problem    HPI  [name] is a 49 y.o. male with long history of psychiatric issues and substance abuse was in Rehab in [place] for ~60 days, came home yesterday afternoon and began drinking alcohol last night. Also used some cocaine this morning, called EMS because he thought he used too much. He has no medical complaints. Not suicidal and no hallucinations. He has PCP follow up scheduled for next week.    Past Medical History:  Diagnosis Date  . Alcohol dependence with withdrawal (HCC)   . Anxiety   . Asthma   . Atrial fibrillation (HCC)    not on AC  . Bipolar 1 disorder (HCC)   . Class 2 obesity   . Cocaine abuse (HCC)   . Depression   . Heart palpitations    with severe anxiety  . Hypertension   . PTSD (post-traumatic stress disorder)   . PTSD (post-traumatic stress disorder)     Past Surgical History:  Procedure Laterality Date  . NO PAST SURGERIES      Family History  Problem Relation Age of Onset  . Alcohol abuse Mother   . Hypertension Father   . Dementia Father   . Alcohol abuse Maternal Grandfather     Social History   Tobacco Use  . Smoking status: Never Smoker  . Smokeless tobacco: Never Used  Vaping Use  . Vaping Use: Never used  Substance Use Topics  . Alcohol use: Yes    Comment: reports that his last drink was last Wednesday when he had 4- 24oz of liquor, said that 4 days out of the wk he drinks 4-24 ounce beers  . Drug use: Yes    Types: Cocaine    Comment: last use was "last Wednesday" and said that he uses a "minimal" amount, 1st time he used it since 2016     Home Medications Prior to Admission medications   Medication Sig Start Date End Date Taking? Authorizing Provider  amLODipine (NORVASC) 5 MG tablet Take 1 tablet (5 mg total) by mouth daily. 06/26/20  06/26/21  Aldean Baker, NP  citalopram (CELEXA) 20 MG tablet Take 1 tablet (20 mg total) by mouth daily. 06/27/20   Aldean Baker, NP  hydrOXYzine (ATARAX/VISTARIL) 50 MG tablet Take 1 tablet (50 mg total) by mouth 3 (three) times daily as needed for anxiety. 06/26/20   Aldean Baker, NP  prazosin (MINIPRESS) 1 MG capsule Take 1 capsule (1 mg total) by mouth at bedtime. 06/26/20   Aldean Baker, NP  QUEtiapine (SEROQUEL) 300 MG tablet Take 1 tablet (300 mg total) by mouth at bedtime. 06/26/20   Aldean Baker, NP     Allergies    Patient has no known allergies.   Review of Systems   Review of Systems A comprehensive review of systems was completed and negative except as noted in HPI.    Physical Exam BP (!) 164/89 (BP Location: Right Arm)   Pulse (!) 102   Temp 98.1 F (36.7 C) (Oral)   Resp 20   SpO2 100%   Physical Exam Vitals and nursing note reviewed.  Constitutional:      Appearance: Normal appearance.  HENT:     Head: Normocephalic and atraumatic.     Nose: Nose normal.     Mouth/Throat:  Mouth: Mucous membranes are moist.  Eyes:     Extraocular Movements: Extraocular movements intact.     Conjunctiva/sclera: Conjunctivae normal.  Cardiovascular:     Rate and Rhythm: Normal rate.  Pulmonary:     Effort: Pulmonary effort is normal.     Breath sounds: Normal breath sounds.  Abdominal:     General: Abdomen is flat.     Palpations: Abdomen is soft.     Tenderness: There is no abdominal tenderness.  Musculoskeletal:        General: No swelling. Normal range of motion.     Cervical back: Neck supple.  Skin:    General: Skin is warm and dry.  Neurological:     General: No focal deficit present.     Mental Status: He is alert.  Psychiatric:     Comments: Tearful but has insight into his illness and understands the need to abstain given his recent sobriety      ED Results / Procedures / Treatments   Labs (all labs ordered are listed, but only  abnormal results are displayed) Labs Reviewed - No data to display  EKG None  Radiology No results found.  Procedures Procedures  Medications Ordered in the ED Medications - No data to display   MDM Rules/Calculators/A&P MDM Patient here after recent rehab stay and near immediate relapse into EtOH and cocaine abuse. No concern for withdrawal given recent sobriety, no SI/HI or hallucinations to suggest additional acute psychiatric issues. Advised to abstain from drug or alcohol use, follow up with PCP as scheduled and with outpatient psych for long term management. He has a safe place to go from the ED.  ED Course  I have reviewed the triage vital signs and the nursing notes.  Pertinent labs & imaging results that were available during my care of the patient were reviewed by me and considered in my medical decision making (see chart for details).     Final Clinical Impression(s) / ED Diagnoses Final diagnoses:  Alcohol abuse  Cocaine abuse [facility])    Rx / DC Orders ED Discharge Orders    None       Pollyann Savoy, MD 08/20/20 1049

## 2020-08-20 NOTE — ED Notes (Signed)
Pt discharged from this ED in stable condition at this time. All discharge instructions and follow up care reviewed with pt with no further questions at this time. Pt ambulatory with steady gait, clear speech.  

## 2020-08-21 ENCOUNTER — Ambulatory Visit (HOSPITAL_COMMUNITY): Payer: Medicare Other | Admitting: Licensed Clinical Social Worker

## 2020-08-22 ENCOUNTER — Encounter (HOSPITAL_COMMUNITY): Payer: Self-pay

## 2020-08-22 ENCOUNTER — Other Ambulatory Visit: Payer: Self-pay

## 2020-08-22 ENCOUNTER — Inpatient Hospital Stay (HOSPITAL_COMMUNITY)
Admission: EM | Admit: 2020-08-22 | Discharge: 2020-08-23 | DRG: 894 | Discharge status: Against Medical Advice | Payer: Medicare Other | Attending: Family Medicine | Admitting: Family Medicine

## 2020-08-22 ENCOUNTER — Emergency Department (HOSPITAL_COMMUNITY): Payer: Medicare Other

## 2020-08-22 DIAGNOSIS — R609 Edema, unspecified: Secondary | ICD-10-CM | POA: Diagnosis present

## 2020-08-22 DIAGNOSIS — Z811 Family history of alcohol abuse and dependence: Secondary | ICD-10-CM

## 2020-08-22 DIAGNOSIS — F10230 Alcohol dependence with withdrawal, uncomplicated: Secondary | ICD-10-CM

## 2020-08-22 DIAGNOSIS — F333 Major depressive disorder, recurrent, severe with psychotic symptoms: Secondary | ICD-10-CM | POA: Diagnosis present

## 2020-08-22 DIAGNOSIS — F1424 Cocaine dependence with cocaine-induced mood disorder: Secondary | ICD-10-CM | POA: Diagnosis present

## 2020-08-22 DIAGNOSIS — E669 Obesity, unspecified: Secondary | ICD-10-CM | POA: Diagnosis present

## 2020-08-22 DIAGNOSIS — F10939 Alcohol use, unspecified with withdrawal, unspecified: Secondary | ICD-10-CM | POA: Diagnosis present

## 2020-08-22 DIAGNOSIS — J45909 Unspecified asthma, uncomplicated: Secondary | ICD-10-CM | POA: Diagnosis present

## 2020-08-22 DIAGNOSIS — F1410 Cocaine abuse, uncomplicated: Secondary | ICD-10-CM | POA: Diagnosis present

## 2020-08-22 DIAGNOSIS — Z6841 Body Mass Index (BMI) 40.0 and over, adult: Secondary | ICD-10-CM

## 2020-08-22 DIAGNOSIS — R000 Tachycardia, unspecified: Secondary | ICD-10-CM

## 2020-08-22 DIAGNOSIS — F4310 Post-traumatic stress disorder, unspecified: Secondary | ICD-10-CM | POA: Diagnosis present

## 2020-08-22 DIAGNOSIS — Z5329 Procedure and treatment not carried out because of patient's decision for other reasons: Secondary | ICD-10-CM | POA: Diagnosis present

## 2020-08-22 DIAGNOSIS — R079 Chest pain, unspecified: Secondary | ICD-10-CM | POA: Diagnosis present

## 2020-08-22 DIAGNOSIS — E876 Hypokalemia: Secondary | ICD-10-CM | POA: Diagnosis present

## 2020-08-22 DIAGNOSIS — F319 Bipolar disorder, unspecified: Secondary | ICD-10-CM | POA: Diagnosis present

## 2020-08-22 DIAGNOSIS — F1024 Alcohol dependence with alcohol-induced mood disorder: Secondary | ICD-10-CM | POA: Diagnosis present

## 2020-08-22 DIAGNOSIS — I10 Essential (primary) hypertension: Secondary | ICD-10-CM | POA: Diagnosis present

## 2020-08-22 DIAGNOSIS — Z8249 Family history of ischemic heart disease and other diseases of the circulatory system: Secondary | ICD-10-CM

## 2020-08-22 DIAGNOSIS — F1910 Other psychoactive substance abuse, uncomplicated: Secondary | ICD-10-CM

## 2020-08-22 DIAGNOSIS — F1590 Other stimulant use, unspecified, uncomplicated: Secondary | ICD-10-CM | POA: Diagnosis present

## 2020-08-22 DIAGNOSIS — F1014 Alcohol abuse with alcohol-induced mood disorder: Secondary | ICD-10-CM | POA: Diagnosis present

## 2020-08-22 DIAGNOSIS — Z20822 Contact with and (suspected) exposure to covid-19: Secondary | ICD-10-CM | POA: Diagnosis present

## 2020-08-22 DIAGNOSIS — F10239 Alcohol dependence with withdrawal, unspecified: Principal | ICD-10-CM | POA: Diagnosis present

## 2020-08-22 DIAGNOSIS — Z79899 Other long term (current) drug therapy: Secondary | ICD-10-CM

## 2020-08-22 DIAGNOSIS — G4733 Obstructive sleep apnea (adult) (pediatric): Secondary | ICD-10-CM | POA: Diagnosis present

## 2020-08-22 LAB — BASIC METABOLIC PANEL
Anion gap: 16 — ABNORMAL HIGH (ref 5–15)
BUN: <5 mg/dL — ABNORMAL LOW (ref 6–20)
CO2: 23 mmol/L (ref 22–32)
Calcium: 8.5 mg/dL — ABNORMAL LOW (ref 8.9–10.3)
Chloride: 99 mmol/L (ref 98–111)
Creatinine, Ser: 0.83 mg/dL (ref 0.61–1.24)
GFR, Estimated: >60 mL/min (ref >60)
Glucose, Bld: 100 mg/dL — ABNORMAL HIGH (ref 70–99)
Potassium: 3.1 mmol/L — ABNORMAL LOW (ref 3.5–5.1)
Sodium: 138 mmol/L (ref 135–145)

## 2020-08-22 LAB — TROPONIN I (HIGH SENSITIVITY)
Troponin I (High Sensitivity): 8 ng/L (ref <18)
Troponin I (High Sensitivity): 9 ng/L (ref <18)

## 2020-08-22 LAB — CBC
HCT: 41.4 % (ref 39.0–52.0)
Hemoglobin: 14 g/dL (ref 13.0–17.0)
MCH: 27.5 pg (ref 26.0–34.0)
MCHC: 33.8 g/dL (ref 30.0–36.0)
MCV: 81.2 fL (ref 80.0–100.0)
Platelets: 307 10*3/uL (ref 150–400)
RBC: 5.1 MIL/uL (ref 4.22–5.81)
RDW: 13.5 % (ref 11.5–15.5)
WBC: 6.9 10*3/uL (ref 4.0–10.5)
nRBC: 0 % (ref 0.0–0.2)

## 2020-08-22 LAB — RESP PANEL BY RT-PCR (FLU A&B, COVID) ARPGX2
Influenza A by PCR: NEGATIVE
Influenza B by PCR: NEGATIVE
SARS Coronavirus 2 by RT PCR: NEGATIVE

## 2020-08-22 LAB — HEPATIC FUNCTION PANEL
ALT: 18 U/L (ref 0–44)
AST: 30 U/L (ref 15–41)
Albumin: 3.7 g/dL (ref 3.5–5.0)
Alkaline Phosphatase: 94 U/L (ref 38–126)
Bilirubin, Direct: 0.3 mg/dL — ABNORMAL HIGH (ref 0.0–0.2)
Indirect Bilirubin: 1.8 mg/dL — ABNORMAL HIGH (ref 0.3–0.9)
Total Bilirubin: 2.1 mg/dL — ABNORMAL HIGH (ref 0.3–1.2)
Total Protein: 7 g/dL (ref 6.5–8.1)

## 2020-08-22 LAB — RAPID URINE DRUG SCREEN, HOSP PERFORMED
Amphetamines: NOT DETECTED
Barbiturates: NOT DETECTED
Benzodiazepines: NOT DETECTED
Cocaine: POSITIVE — AB
Opiates: NOT DETECTED
Tetrahydrocannabinol: NOT DETECTED

## 2020-08-22 LAB — ETHANOL: Alcohol, Ethyl (B): <10 mg/dL (ref <10)

## 2020-08-22 LAB — MAGNESIUM: Magnesium: 2.2 mg/dL (ref 1.7–2.4)

## 2020-08-22 MED ORDER — ACETAMINOPHEN 650 MG RE SUPP: 650.0000 mg | Freq: Four times a day (QID) | RECTAL | Status: DC | PRN

## 2020-08-22 MED ORDER — THIAMINE HCL 100 MG/ML IJ SOLN
100.0000 mg | Freq: Every day | INTRAMUSCULAR | Status: DC
  Administered 2020-08-22: 100 mg via INTRAVENOUS
  Filled 2020-08-22 (×2): qty 2

## 2020-08-22 MED ORDER — SODIUM CHLORIDE 0.9 % IV BOLUS
500.0000 mL | Freq: Once | INTRAVENOUS | Status: AC
  Administered 2020-08-22: 500 mL via INTRAVENOUS

## 2020-08-22 MED ORDER — LORAZEPAM 2 MG/ML IJ SOLN
0.0000 mg | Freq: Four times a day (QID) | INTRAMUSCULAR | Status: DC
  Administered 2020-08-22: 2 mg via INTRAVENOUS
  Filled 2020-08-22: qty 1

## 2020-08-22 MED ORDER — THIAMINE HCL 100 MG PO TABS
100.0000 mg | ORAL_TABLET | Freq: Every day | ORAL | Status: DC
  Administered 2020-08-23: 100 mg via ORAL
  Filled 2020-08-22 (×2): qty 1

## 2020-08-22 MED ORDER — POLYETHYLENE GLYCOL 3350 17 G PO PACK: 17.0000 g | PACK | Freq: Every day | ORAL | Status: DC | PRN

## 2020-08-22 MED ORDER — ACETAMINOPHEN 325 MG PO TABS: 650.0000 mg | ORAL_TABLET | Freq: Four times a day (QID) | ORAL | Status: DC | PRN

## 2020-08-22 MED ORDER — LORAZEPAM 2 MG/ML IJ SOLN: 0.0000 mg | Freq: Two times a day (BID) | INTRAMUSCULAR | Status: DC

## 2020-08-22 MED ORDER — LORAZEPAM 1 MG PO TABS
0.0000 mg | ORAL_TABLET | Freq: Four times a day (QID) | ORAL | Status: DC
  Administered 2020-08-23 (×3): 1 mg via ORAL
  Filled 2020-08-22 (×3): qty 1

## 2020-08-22 MED ORDER — POTASSIUM CHLORIDE CRYS ER 20 MEQ PO TBCR
40.0000 meq | EXTENDED_RELEASE_TABLET | Freq: Once | ORAL | Status: AC
  Administered 2020-08-22: 40 meq via ORAL
  Filled 2020-08-22: qty 2

## 2020-08-22 MED ORDER — LORAZEPAM 1 MG PO TABS: 0.0000 mg | ORAL_TABLET | Freq: Two times a day (BID) | ORAL | Status: DC

## 2020-08-22 MED ORDER — ENOXAPARIN SODIUM 40 MG/0.4ML ~~LOC~~ SOLN
40.0000 mg | SUBCUTANEOUS | Status: DC
  Administered 2020-08-22: 40 mg via SUBCUTANEOUS
  Filled 2020-08-22: qty 0.4

## 2020-08-22 MED ORDER — AMLODIPINE BESYLATE 5 MG PO TABS: 5.0000 mg | ORAL_TABLET | Freq: Every day | ORAL | Status: DC

## 2020-08-22 MED ORDER — LACTATED RINGERS IV BOLUS
500.0000 mL | Freq: Once | INTRAVENOUS | Status: AC
  Administered 2020-08-22: 500 mL via INTRAVENOUS

## 2020-08-22 MED ORDER — SODIUM CHLORIDE 0.9% FLUSH
3.0000 mL | Freq: Two times a day (BID) | INTRAVENOUS | Status: DC
  Administered 2020-08-22 – 2020-08-23 (×2): 3 mL via INTRAVENOUS

## 2020-08-22 NOTE — ED Notes (Signed)
Pt states he isd having anxiety attack and is withdrawing from alcohol. Last drink yesterday at 1800.

## 2020-08-22 NOTE — ED Triage Notes (Signed)
Pt [name] for eval of CP post cocaine use. States he smoked it prior to midnight, CP started 5-16min after. No radiation initially, began to radiate up both sides of neck on arrival to ED. Describes it as sharp, 8/10. ST, EKG unremarkable, VSS. Pt A&O4, NAD noted on arrival to triage.

## 2020-08-22 NOTE — ED Notes (Signed)
Pt wanted his vitals rechecked

## 2020-08-22 NOTE — H&P (Addendum)
History and Physical   [name] IRW:[id number] DOB: Sep 12, 1970 DOA: 08/22/2020  PCP: Patient, No Pcp Per   Patient coming from: Home  Chief Complaint: Anxiety, tremor  HPI: [name] is a 49 y.o. male with medical history significant of alcohol use, bipolar 1, obesity, depression, PTSD, cocaine use who presents with worsening anxiety and tremor concern for alcohol withdrawal.  Patient was seen in the ED on 08/2017 after he reported relapse of cocaine and alcohol use.  Prior to this he had been at a rehab center and vomiting for about 60 days.  He presents today saying he has been on a bender for several days drinking at least 1040 ounce beers and multiple cans of beer as well.  He also reports some sharp chest pain that he rates as 8 out of 10 that radiates to his neck.  Chest pain began 5 or 10 minutes after cocaine use. No chest pain at this time.  He states his last alcohol use was 6 PM yesterday.  He has had multiple hospitalizations for withdrawal in the past.  He denies fever, chills, SOB, abdominal pain, nausea, aspiration, diarrhea. Reports he has chronic edema, which improves with leg elevation.  ED Course: Vitals in ED significant for tachycardia to the 100s, blood pressure 145 160 systolic.  Lab work showed BMP with hypokalemia of 3.1.  LFTs showed calcium of 8.5 and T bili 2.1.  CBC within normal limits.  Respiratory panel for flu and Covid negative.  Troponin negative x2.  UDS positive for cocaine.  UA pending.  EtOH level pending.  Patient given Ativan, cough I am and some IV fluids in ED.  Review of Systems: As per HPI otherwise all other systems reviewed and are negative.  Past Medical History:  Diagnosis Date  . Alcohol dependence with withdrawal (HCC)   . Anxiety   . Asthma   . Atrial fibrillation (HCC)    not on AC  . Bipolar 1 disorder (HCC)   . Class 2 obesity   . Cocaine abuse (HCC)   . Depression   . Heart palpitations    with severe anxiety  .  Hypertension   . PTSD (post-traumatic stress disorder)   . PTSD (post-traumatic stress disorder)     Past Surgical History:  Procedure Laterality Date  . NO PAST SURGERIES      Social History  reports that he has never smoked. He has never used smokeless tobacco. He reports current alcohol use. He reports current drug use. Drug: Cocaine.  No Known Allergies  Family History  Problem Relation Age of Onset  . Alcohol abuse Mother   . Hypertension Father   . Dementia Father   . Alcohol abuse Maternal Grandfather   Reviewed on admission  Prior to Admission medications   Medication Sig Start Date End Date Taking? Authorizing Provider  amLODipine (NORVASC) 5 MG tablet Take 1 tablet (5 mg total) by mouth daily. 06/26/20 06/26/21 Yes Aldean Baker, NP  cloNIDine (CATAPRES) 0.2 MG tablet Take 0.2 mg by mouth 3 (three) times daily as needed. 07/17/20  Yes [provider]  doxepin (SINEQUAN) 10 MG capsule Take 10 mg by mouth at bedtime. 08/20/20  Yes [provider]  citalopram (CELEXA) 20 MG tablet Take 1 tablet (20 mg total) by mouth daily. Patient not taking: No sig reported 06/27/20   Aldean Baker, NP  hydrOXYzine (ATARAX/VISTARIL) 50 MG tablet Take 1 tablet (50 mg total) by mouth 3 (three) times daily  as needed for anxiety. Patient not taking: No sig reported 06/26/20   Aldean Baker, NP  prazosin (MINIPRESS) 1 MG capsule Take 1 capsule (1 mg total) by mouth at bedtime. Patient not taking: No sig reported 06/26/20   Aldean Baker, NP  QUEtiapine (SEROQUEL) 300 MG tablet Take 1 tablet (300 mg total) by mouth at bedtime. Patient not taking: No sig reported 06/26/20   Aldean Baker, NP    Physical Exam: Vitals:   08/22/20 1800 08/22/20 1854 08/22/20 2000 08/22/20 2030  BP: (!) 147/98 (!) 153/95 (!) 176/97 (!) 147/88  Pulse: (!) 104 98 98 98  Resp:   (!) 22 18  Temp:      TempSrc:      SpO2:   90% 100%  Weight:      Height:       Physical  Exam Constitutional:      General: He is not in acute distress.    Appearance: Normal appearance.     Comments: Drowsy obese male, intermittent snoring when seen  HENT:     Head: Normocephalic and atraumatic.     Mouth/Throat:     Mouth: Mucous membranes are moist.     Pharynx: Oropharynx is clear.  Eyes:     Extraocular Movements: Extraocular movements intact.     Pupils: Pupils are equal, round, and reactive to light.  Cardiovascular:     Rate and Rhythm: Regular rhythm. Tachycardia present.     Pulses: Normal pulses.     Heart sounds: Normal heart sounds.  Pulmonary:     Effort: Pulmonary effort is normal. No respiratory distress.     Breath sounds: Normal breath sounds. No wheezing or rales.     Comments: Intermittent upper airway sounds Abdominal:     General: Bowel sounds are normal. There is no distension.     Palpations: Abdomen is soft.     Tenderness: There is no abdominal tenderness.     Comments: obese  Musculoskeletal:        General: No swelling or deformity.     Comments: Trace edema  Skin:    General: Skin is warm and dry.     Comments: Multiple scars on bilateral upper extremities  Neurological:     General: No focal deficit present.     Mental Status: Mental status is at baseline.    Labs on Admission: I have personally reviewed following labs and imaging studies  CBC: Recent Labs  Lab 08/22/20 0053  WBC 6.9  HGB 14.0  HCT 41.4  MCV 81.2  PLT 307    Basic Metabolic Panel: Recent Labs  Lab 08/22/20 0053 08/22/20 1829  NA 138  --   K 3.1*  --   CL 99  --   CO2 23  --   GLUCOSE 100*  --   BUN <5*  --   CREATININE 0.83  --   CALCIUM 8.5*  --   MG  --  2.2    GFR: Estimated Creatinine Clearance: 133.6 mL/min (by C-G formula based on SCr of 0.83 mg/dL).  Liver Function Tests: Recent Labs  Lab 08/22/20 1829  AST 30  ALT 18  ALKPHOS 94  BILITOT 2.1*  PROT 7.0  ALBUMIN 3.7    Urine analysis:    Component Value Date/Time    COLORURINE YELLOW 07/16/2019 1740   APPEARANCEUR HAZY (A) 07/16/2019 1740   LABSPEC 1.011 07/16/2019 1740   PHURINE 8.0 07/16/2019 1740   GLUCOSEU NEGATIVE 07/16/2019 1740  HGBUR NEGATIVE 07/16/2019 1740   BILIRUBINUR NEGATIVE 07/16/2019 1740   KETONESUR NEGATIVE 07/16/2019 1740   PROTEINUR NEGATIVE 07/16/2019 1740   UROBILINOGEN 0.2 04/09/2015 0206   NITRITE NEGATIVE 07/16/2019 1740   LEUKOCYTESUR NEGATIVE 07/16/2019 1740    Radiological Exams on Admission: DG Chest Port 1 View  Result Date: 08/22/2020 CLINICAL DATA:  Cough and wheezing EXAM: PORTABLE CHEST 1 VIEW COMPARISON:  06/17/2020 FINDINGS: Single frontal view of the chest demonstrates an unremarkable cardiac silhouette. Continued ectasia of the thoracic aorta. There is no airspace disease, effusion, or pneumothorax. No acute bony abnormalities. IMPRESSION: 1. No acute intrathoracic process. Electronically Signed   By: Sharlet SalinaMichael  Brown M.D.   On: 08/22/2020 19:10    EKG: Independently reviewed.  Sinus tachycardia, 103 bpm, Q-wave V3 V4, possible old infarct.  Assessment/Plan Principal Problem:   Alcohol withdrawal (HCC) Active Problems:   Hypertension   Post traumatic stress disorder (PTSD)   Cocaine dependence with cocaine-induced mood disorder (HCC)   Alcohol abuse with alcohol-induced mood disorder (HCC)   Cocaine abuse (HCC)   Bipolar 1 disorder (HCC)   MDD (major depressive disorder), recurrent, severe, with psychosis (HCC)  Alcohol use Cocaine use Alcohol withdrawal > Patient with history of alcohol and cocaine use and multiple admissions for alcohol withdrawal > Until about a week ago was in [place] 60-day rehab stay > Reports heavy alcohol use of at least 1040 ounce beers and multiple cans of beers over the last several days with last drink being 6 PM yesterday > Has increasing anxiety and tremors > Withdrawal seems less likely in the setting of only drinking again for less than a week, likely his  cocaine use is playing a larger role.  Counseled on cessation. - Continue as needed Ativan - Continue thiamine - ETOH level - CIWA  Hypertension - Continue home amlodipine  Depression PTSD Bipolar 1 > States he is not currently taking daily medications for mental health - Continue to monitor   DVT prophylaxis: Lovenox  Code Status:   Full  Family Communication:  None on admission  Disposition Plan:   Patient is from:  Home  Anticipated DC to:  Home  Anticipated DC date:  12/23 pending clinical course  Anticipated DC barriers: None  Consults called:  None  Admission status:  Observation, telemetry   Severity of Illness: The appropriate patient status for this patient is OBSERVATION. Observation status is judged to be reasonable and necessary in order to provide the required intensity of service to ensure the patient's safety. The patient's presenting symptoms, physical exam findings, and initial radiographic and laboratory data in the context of their medical condition is felt to place them at decreased risk for further clinical deterioration. Furthermore, it is anticipated that the patient will be medically stable for discharge from the hospital within 2 midnights of admission. The following factors support the patient status of observation.   " The patient's presenting symptoms include worsening anxiety, tremor, tachycardia. " The physical exam findings include tachycardia. " The initial radiographic and laboratory data are concerning for potassium of 3.1, debility 2.1, UDS positive for cocaine.   Synetta FailAlexander B Luverne Farone MD [facility]  How to contact the [facility] Attending or Consulting provider 7A - 7P or covering provider during after hours 7P -7A, for this patient?   1. Check the care team in [facility] and look for a) attending/consulting [facility] provider listed and b) the [facility] team listed 2. Log into [email or web address] and use [facility]'s universal password  to access. If you do not have  the password, please contact the hospital operator. 3. Locate the [facility] provider you are looking for under [facility] and page to a number that you can be directly reached. 4. If you still have difficulty reaching the provider, please page the [facility] (Director on Call) for the Hospitalists listed on amion for assistance.  08/22/2020, 9:34 PM

## 2020-08-22 NOTE — ED Provider Notes (Signed)
[facility] EMERGENCY DEPARTMENT Provider Note   CSN: [id number] Arrival date & time: 08/22/20  0043     History Chief Complaint  Patient presents with   Chest Pain    [name] is a 49 y.o. male.  HPI   Patient with significant medical history of alcohol dependency with withdrawals, anxiety, bipolar, polysubstance abuse hypertension, PTSD presents to the emergency department with chief complaint of alcohol withdrawals.  Patient states he recently got out of rehab and unfortunately relapsed, states he started drinking on Sunday and stopped on Tuesday around 6 PM.  He endorses drinking 10-40 ounce cans of beer and multiple small cans of beer.  He has a history of alcohol withdraws and has recently been hospitalized.  Patient states he also did some cocaine last night and felt like his heart was racing.  He went to the emergency department 12/20 where he was discharged. Patient states he is feeling very anxious at this time with constant  body shaking. He thinks this is from not having any alcohol over 20 hours.  he denies suicidal or homicidal ideations, but states he sometimes sees things are not really there he describes his face is when he closes his eyes.  He denies any alleviating factors.  Patient denies fevers, chills, shortness of breath, abdominal pain, nausea, vomiting, diarrhea, pedal edema.  Past Medical History:  Diagnosis Date   Alcohol dependence with withdrawal (HCC)    Anxiety    Asthma    Atrial fibrillation (HCC)    not on AC   Bipolar 1 disorder (HCC)    Class 2 obesity    Cocaine abuse (HCC)    Depression    Heart palpitations    with severe anxiety   Hypertension    PTSD (post-traumatic stress disorder)    PTSD (post-traumatic stress disorder)     Patient Active Problem List   Diagnosis Date Noted   Alcohol withdrawal (HCC) 08/22/2020   Bipolar 1 disorder (HCC) 06/20/2020   MDD (major depressive disorder),  recurrent, severe, with psychosis (HCC) 06/20/2020   Cocaine abuse (HCC) 06/14/2020   Class 2 obesity due to excess calories with body mass index (BMI) of 39.0 to 39.9 in adult 06/14/2020   Alcohol abuse with alcohol-induced mood disorder (HCC) 01/18/2017   Cocaine dependence with cocaine-induced mood disorder (HCC)    Hypertension 09/08/2011   Post traumatic stress disorder (PTSD) 09/08/2011    Past Surgical History:  Procedure Laterality Date   NO PAST SURGERIES         Family History  Problem Relation Age of Onset   Alcohol abuse Mother    Hypertension Father    Dementia Father    Alcohol abuse Maternal Grandfather     Social History   Tobacco Use   Smoking status: Never Smoker   Smokeless tobacco: Never Used  Vaping Use   Vaping Use: Never used  Substance Use Topics   Alcohol use: Yes    Comment: reports that his last drink was last Wednesday when he had 4- 24oz of liquor, said that 4 days out of the wk he drinks 4-24 ounce beers   Drug use: Yes    Types: Cocaine    Comment: last use was "last Wednesday" and said that he uses a "minimal" amount, 1st time he used it since 2016    Home Medications Prior to Admission medications   Medication Sig Start Date End Date Taking? Authorizing Provider  amLODipine (NORVASC) 5 MG tablet  Take 1 tablet (5 mg total) by mouth daily. 06/26/20 06/26/21 Yes Aldean Baker, NP  cloNIDine (CATAPRES) 0.2 MG tablet Take 0.2 mg by mouth 3 (three) times daily as needed. 07/17/20  Yes [provider]  doxepin (SINEQUAN) 10 MG capsule Take 10 mg by mouth at bedtime. 08/20/20  Yes [provider]  citalopram (CELEXA) 20 MG tablet Take 1 tablet (20 mg total) by mouth daily. Patient not taking: No sig reported 06/27/20   Aldean Baker, NP  hydrOXYzine (ATARAX/VISTARIL) 50 MG tablet Take 1 tablet (50 mg total) by mouth 3 (three) times daily as needed for anxiety. Patient not taking: No sig reported 06/26/20    Aldean Baker, NP  prazosin (MINIPRESS) 1 MG capsule Take 1 capsule (1 mg total) by mouth at bedtime. Patient not taking: No sig reported 06/26/20   Aldean Baker, NP  QUEtiapine (SEROQUEL) 300 MG tablet Take 1 tablet (300 mg total) by mouth at bedtime. Patient not taking: No sig reported 06/26/20   Aldean Baker, NP    Allergies    Patient has no known allergies.  Review of Systems   Review of Systems  Constitutional: Negative for chills and fever.  HENT: Negative for congestion.   Respiratory: Negative for shortness of breath.   Cardiovascular: Positive for chest pain.  Gastrointestinal: Negative for abdominal pain, diarrhea, nausea and vomiting.  Genitourinary: Negative for dysuria and enuresis.  Musculoskeletal: Negative for back pain.  Skin: Negative for rash.  Neurological: Negative for headaches.  Hematological: Does not bruise/bleed easily.  Psychiatric/Behavioral: Negative for self-injury and suicidal ideas. The patient is nervous/anxious.     Physical Exam Updated Vital Signs BP (!) 194/100    Pulse 92    Temp 98.1 F (36.7 C) (Oral)    Resp (!) 23    Ht 5\' 7"  (1.702 m)    Wt 117.9 kg    SpO2 92%    BMI 40.72 kg/m   Physical Exam Vitals and nursing note reviewed.  Constitutional:      General: He is in acute distress.     Appearance: Normal appearance. He is not ill-appearing or diaphoretic.  HENT:     Head: Normocephalic and atraumatic.     Nose: No congestion or rhinorrhea.     Mouth/Throat:     Mouth: Mucous membranes are moist.     Pharynx: Oropharynx is clear.  Eyes:     General: Scleral icterus present. No visual field deficit.    Extraocular Movements: Extraocular movements intact.     Conjunctiva/sclera: Conjunctivae normal.     Pupils: Pupils are equal, round, and reactive to light.  Cardiovascular:     Rate and Rhythm: Regular rhythm. Tachycardia present.     Pulses: Normal pulses.     Heart sounds: No murmur heard. No friction rub. No gallop.    Pulmonary:     Effort: Pulmonary effort is normal. No respiratory distress.     Breath sounds: No wheezing, rhonchi or rales.  Abdominal:     General: There is no distension.     Palpations: Abdomen is soft.     Tenderness: There is no abdominal tenderness. There is no guarding.  Musculoskeletal:        General: Swelling present. No tenderness.     Right lower leg: Edema present.     Left lower leg: Edema present.     Comments: Patient is moving all 4 extremities at difficulty.  Skin:    General: Skin  is warm and dry.  Neurological:     General: No focal deficit present.     Mental Status: He is alert and oriented to person, place, and time.     GCS: GCS eye subscore is 4. GCS verbal subscore is 5. GCS motor subscore is 6.     Cranial Nerves: Cranial nerves are intact. No cranial nerve deficit or facial asymmetry.     Sensory: Sensation is intact. No sensory deficit.     Motor: Motor function is intact. No weakness or pronator drift.     Coordination: Coordination is intact. Romberg sign negative.     Comments: Patient had no difficulty with word finding.  Psychiatric:        Mood and Affect: Mood normal.     Comments: Patient appears to be anxious, has bilateral tremors in the upper extremities.     ED Results / Procedures / Treatments   Labs (all labs ordered are listed, but only abnormal results are displayed) Labs Reviewed  BASIC METABOLIC PANEL - Abnormal; Notable for the following components:      Result Value   Potassium 3.1 (*)    Glucose, Bld 100 (*)    BUN <5 (*)    Calcium 8.5 (*)    Anion gap 16 (*)    All other components within normal limits  HEPATIC FUNCTION PANEL - Abnormal; Notable for the following components:   Total Bilirubin 2.1 (*)    Bilirubin, Direct 0.3 (*)    Indirect Bilirubin 1.8 (*)    All other components within normal limits  RAPID URINE DRUG SCREEN, HOSP PERFORMED - Abnormal; Notable for the following components:   Cocaine POSITIVE (*)     All other components within normal limits  RESP PANEL BY RT-PCR (FLU A&B, COVID) ARPGX2  CBC  MAGNESIUM  ETHANOL  URINALYSIS, ROUTINE W REFLEX MICROSCOPIC  COMPREHENSIVE METABOLIC PANEL  CBC  TROPONIN I (HIGH SENSITIVITY)  TROPONIN I (HIGH SENSITIVITY)    EKG EKG Interpretation  Date/Time:  Wednesday August 22 2020 00:47:06 EST Ventricular Rate:  103 PR Interval:  178 QRS Duration: 96 QT Interval:  374 QTC Calculation: 489 R Axis:   -12 Text Interpretation: Sinus tachycardia Possible Left atrial enlargement Anterior infarct , age undetermined Abnormal ECG Confirmed by Kristine Royal [phone]) on 08/22/2020 7:16:55 PM   Radiology DG Chest Port 1 View  Result Date: 08/22/2020 CLINICAL DATA:  Cough and wheezing EXAM: PORTABLE CHEST 1 VIEW COMPARISON:  06/17/2020 FINDINGS: Single frontal view of the chest demonstrates an unremarkable cardiac silhouette. Continued ectasia of the thoracic aorta. There is no airspace disease, effusion, or pneumothorax. No acute bony abnormalities. IMPRESSION: 1. No acute intrathoracic process. Electronically Signed   By: Sharlet Salina M.D.   On: 08/22/2020 19:10    Procedures Procedures (including critical care time)  Medications Ordered in ED Medications  LORazepam (ATIVAN) injection 0-4 mg (2 mg Intravenous Given 08/22/20 1824)    Or  LORazepam (ATIVAN) tablet 0-4 mg ( Oral See Alternative 08/22/20 1824)  LORazepam (ATIVAN) injection 0-4 mg (has no administration in time range)    Or  LORazepam (ATIVAN) tablet 0-4 mg (has no administration in time range)  thiamine tablet 100 mg ( Oral See Alternative 08/22/20 1831)    Or  thiamine (B-1) injection 100 mg (100 mg Intravenous Given 08/22/20 1831)  amLODipine (NORVASC) tablet 5 mg (has no administration in time range)  enoxaparin (LOVENOX) injection 40 mg (has no administration in time range)  sodium chloride  flush (NS) 0.9 % injection 3 mL (has no administration in time range)  potassium  chloride SA (KLOR-CON) CR tablet 40 mEq (has no administration in time range)  acetaminophen (TYLENOL) tablet 650 mg (has no administration in time range)    Or  acetaminophen (TYLENOL) suppository 650 mg (has no administration in time range)  polyethylene glycol (MIRALAX / GLYCOLAX) packet 17 g (has no administration in time range)  sodium chloride 0.9 % bolus 500 mL (0 mLs Intravenous Stopped 08/22/20 2100)  lactated ringers bolus 500 mL (500 mLs Intravenous New Bag/Given 08/22/20 2101)    ED Course  I have reviewed the triage vital signs and the nursing notes.  Pertinent labs & imaging results that were available during my care of the patient were reviewed by me and considered in my medical decision making (see chart for details).    MDM Rules/Calculators/A&P                          Patient presents with chest pain and alcohol withdrawal.  He was alert, appeared to be anxious, vital signs and for tachycardia and hypertension.  Will obtain basic lab work-up, chest x-ray, start him on CIWA and provide Ativan and reevaluate.  Due to persistent tachycardia despite Ativan and fluid resuscitation and history of alcohol withdrawal will consult with hospitalist for further recommendations.  Spoke with Dr. Alinda MoneyMelvin the hospitalist team who agrees to admit the patient and will come and evaluate the patient.  CBC negative for leukocytosis times anemia.  BMP shows slight hypokalemia 3.1, no metabolic acidosis hyperglycemia of 100, no AKI, anion gap of 16.  Hepatic panel shows increased T bili 2.1, increased direct T bili 0.3, indirect T bili 1.8.  Delta troponin negative, urine rapid drug screen positive for cocaine.  Magnesium 2.2.  Respiratory panel negative for Covid, influenza A/B.  EKG shows sinus tach without signs of ischemia no ST elevation depression noted.  I have low suspicion for ACS, EKG was sinus rhythm without signs of ischemia, patient had a delta troponin.  Low suspicion for PE as  patient denies pleuritic chest pain, shortness of breath, patient denies leg pain, no pedal edema noted on exam, patient.   Low suspicion for AAA or aortic dissection as history is atypical, Low suspicion for systemic infection as patient is nontoxic-appearing, vital signs reassuring, no obvious source infection noted on exam.  Suspect patient's tachycardia and anxiety is secondary to withdrawal from alcohol and cocaine use.  Anticipate this patient will need further observation and treatment.  Patient be transferred over to admitting team      Final Clinical Impression(s) / ED Diagnoses Final diagnoses:  Tachycardia  Polysubstance abuse [facility](HCC)    Rx / DC Orders ED Discharge Orders    None       Carroll SageFaulkner, Soriya Worster J, PA-C 08/22/20 2247    Wynetta FinesMessick, Peter C, MD 08/22/20 [phone]

## 2020-08-23 DIAGNOSIS — F10239 Alcohol dependence with withdrawal, unspecified: Secondary | ICD-10-CM | POA: Diagnosis present

## 2020-08-23 DIAGNOSIS — Z79899 Other long term (current) drug therapy: Secondary | ICD-10-CM | POA: Diagnosis not present

## 2020-08-23 DIAGNOSIS — Z811 Family history of alcohol abuse and dependence: Secondary | ICD-10-CM | POA: Diagnosis not present

## 2020-08-23 DIAGNOSIS — R079 Chest pain, unspecified: Secondary | ICD-10-CM | POA: Diagnosis present

## 2020-08-23 DIAGNOSIS — F10230 Alcohol dependence with withdrawal, uncomplicated: Secondary | ICD-10-CM | POA: Diagnosis not present

## 2020-08-23 DIAGNOSIS — Z8249 Family history of ischemic heart disease and other diseases of the circulatory system: Secondary | ICD-10-CM | POA: Diagnosis not present

## 2020-08-23 DIAGNOSIS — F1024 Alcohol dependence with alcohol-induced mood disorder: Secondary | ICD-10-CM | POA: Diagnosis present

## 2020-08-23 DIAGNOSIS — Z6841 Body Mass Index (BMI) 40.0 and over, adult: Secondary | ICD-10-CM | POA: Diagnosis not present

## 2020-08-23 DIAGNOSIS — Z5329 Procedure and treatment not carried out because of patient's decision for other reasons: Secondary | ICD-10-CM | POA: Diagnosis present

## 2020-08-23 DIAGNOSIS — R609 Edema, unspecified: Secondary | ICD-10-CM | POA: Diagnosis present

## 2020-08-23 DIAGNOSIS — J45909 Unspecified asthma, uncomplicated: Secondary | ICD-10-CM | POA: Diagnosis present

## 2020-08-23 DIAGNOSIS — E669 Obesity, unspecified: Secondary | ICD-10-CM | POA: Diagnosis present

## 2020-08-23 DIAGNOSIS — F319 Bipolar disorder, unspecified: Secondary | ICD-10-CM | POA: Diagnosis present

## 2020-08-23 DIAGNOSIS — Z20822 Contact with and (suspected) exposure to covid-19: Secondary | ICD-10-CM | POA: Diagnosis present

## 2020-08-23 DIAGNOSIS — I10 Essential (primary) hypertension: Secondary | ICD-10-CM | POA: Diagnosis present

## 2020-08-23 DIAGNOSIS — E876 Hypokalemia: Secondary | ICD-10-CM | POA: Diagnosis present

## 2020-08-23 DIAGNOSIS — F4310 Post-traumatic stress disorder, unspecified: Secondary | ICD-10-CM | POA: Diagnosis present

## 2020-08-23 DIAGNOSIS — F1424 Cocaine dependence with cocaine-induced mood disorder: Secondary | ICD-10-CM | POA: Diagnosis present

## 2020-08-23 DIAGNOSIS — G4733 Obstructive sleep apnea (adult) (pediatric): Secondary | ICD-10-CM | POA: Diagnosis present

## 2020-08-23 LAB — COMPREHENSIVE METABOLIC PANEL
ALT: 14 U/L (ref 0–44)
AST: 37 U/L (ref 15–41)
Albumin: 3.2 g/dL — ABNORMAL LOW (ref 3.5–5.0)
Alkaline Phosphatase: 78 U/L (ref 38–126)
Anion gap: 10 (ref 5–15)
BUN: 8 mg/dL (ref 6–20)
CO2: 26 mmol/L (ref 22–32)
Calcium: 7.8 mg/dL — ABNORMAL LOW (ref 8.9–10.3)
Chloride: 100 mmol/L (ref 98–111)
Creatinine, Ser: 0.84 mg/dL (ref 0.61–1.24)
GFR, Estimated: >60 mL/min (ref >60)
Glucose, Bld: 96 mg/dL (ref 70–99)
Potassium: 3.9 mmol/L (ref 3.5–5.1)
Sodium: 136 mmol/L (ref 135–145)
Total Bilirubin: 1.2 mg/dL (ref 0.3–1.2)
Total Protein: 6 g/dL — ABNORMAL LOW (ref 6.5–8.1)

## 2020-08-23 LAB — CBC
HCT: 39.9 % (ref 39.0–52.0)
Hemoglobin: 12.7 g/dL — ABNORMAL LOW (ref 13.0–17.0)
MCH: 26.7 pg (ref 26.0–34.0)
MCHC: 31.8 g/dL (ref 30.0–36.0)
MCV: 84 fL (ref 80.0–100.0)
Platelets: 267 10*3/uL (ref 150–400)
RBC: 4.75 MIL/uL (ref 4.22–5.81)
RDW: 13.7 % (ref 11.5–15.5)
WBC: 6.4 10*3/uL (ref 4.0–10.5)
nRBC: 0 % (ref 0.0–0.2)

## 2020-08-23 MED ORDER — LORAZEPAM 2 MG/ML IJ SOLN: 1.0000 mg | INTRAMUSCULAR | Status: DC | PRN

## 2020-08-23 MED ORDER — LORAZEPAM 1 MG PO TABS
1.0000 mg | ORAL_TABLET | ORAL | Status: DC | PRN
  Administered 2020-08-23: 1 mg via ORAL
  Filled 2020-08-23: qty 1

## 2020-08-23 MED ORDER — CLONIDINE HCL 0.1 MG PO TABS: 0.1000 mg | ORAL_TABLET | Freq: Four times a day (QID) | ORAL | Status: DC | PRN

## 2020-08-23 MED ORDER — ADULT MULTIVITAMIN W/MINERALS CH
1.0000 | ORAL_TABLET | Freq: Every day | ORAL | Status: DC
  Administered 2020-08-23: 1 via ORAL
  Filled 2020-08-23: qty 1

## 2020-08-23 MED ORDER — FOLIC ACID 1 MG PO TABS
1.0000 mg | ORAL_TABLET | Freq: Every day | ORAL | Status: DC
  Administered 2020-08-23: 1 mg via ORAL
  Filled 2020-08-23: qty 1

## 2020-08-23 MED ORDER — AMLODIPINE BESYLATE 5 MG PO TABS
10.0000 mg | ORAL_TABLET | Freq: Every day | ORAL | Status: DC
  Administered 2020-08-23: 10 mg via ORAL
  Filled 2020-08-23: qty 2

## 2020-08-23 NOTE — ED Notes (Addendum)
This RN notified MD, Pt Ciwa is an 8 and requested PRN Ativan   1555 Verbal orders receive from MD to order PRN ativan   1732 Pt reported to this RN he wants to be discharge, This RN assess pt mental capacity to make decision pt states if he is admitted will for 1-2 days will be homeless for living with his brother and brother is going out of town. This RN spoke with pt about substance abuse and pt states his father will be checking on him daily. This RN then notified MD and MD reports cannot medically clear him due ETOH withdrawal and states pt can leave AMA  1805 Pt signed AMA form and understands the risk of leaving facility against medical advice

## 2020-08-23 NOTE — ED Notes (Signed)
Pt asking to speak to MD about possible discharge.

## 2020-08-23 NOTE — ED Notes (Signed)
Lunch Tray Ordered @ 1046. 

## 2020-08-23 NOTE — Progress Notes (Signed)
PROGRESS NOTE    [name]  WYO:[id number] DOB: [birth year] DOA: 08/22/2020 PCP: Patient, No Pcp Per   Brief Narrative:  HPI: [name] is a 49 y.o. male with medical history significant of alcohol use, bipolar 1, obesity, depression, PTSD, cocaine use who presents with worsening anxiety and tremor concern for alcohol withdrawal.  Patient was seen in the ED on 08/2017 after he reported relapse of cocaine and alcohol use.  Prior to this he had been at a rehab center and vomiting for about 60 days.  He presents today saying he has been on a bender for several days drinking at least 1040 ounce beers and multiple cans of beer as well.  He also reports some sharp chest pain that he rates as 8 out of 10 that radiates to his neck.  Chest pain began 5 or 10 minutes after cocaine use. No chest pain at this time.  He states his last alcohol use was 6 PM yesterday.  He has had multiple hospitalizations for withdrawal in the past.  He denies fever, chills, SOB, abdominal pain, nausea, aspiration, diarrhea. Reports he has chronic edema, which improves with leg elevation.  ED Course: Vitals in ED significant for tachycardia to the 100s, blood pressure 145 160 systolic.  Lab work showed BMP with hypokalemia of 3.1.  LFTs showed calcium of 8.5 and T bili 2.1.  CBC within normal limits.  Respiratory panel for flu and Covid negative.  Troponin negative x2.  UDS positive for cocaine.  UA pending.  EtOH level pending.  Patient given Ativan, cough I am and some IV fluids in ED.  Assessment & Plan:   Principal Problem:   Alcohol withdrawal (HCC) Active Problems:   Hypertension   Post traumatic stress disorder (PTSD)   Cocaine dependence with cocaine-induced mood disorder (HCC)   Alcohol abuse with alcohol-induced mood disorder (HCC)   Cocaine abuse (HCC)   Bipolar 1 disorder (HCC)   MDD (major depressive disorder), recurrent, severe, with psychosis (HCC)   Alcohol dependence/acute alcohol withdrawal:  Per patient, his last drink was about 4 days ago.  His alcohol level was less than 10.  Patient currently is withdrawing but his symptoms are minor.  His last CIWA was 7.  Despite of this, he is oriented although he is slightly sleepy.  We will continue CIWA with as needed Ativan.  Multivitamins.  Polysubstance abuse: UDS positive for cocaine.  Will provide counseling once he is more awake.  Essential hypertension: Blood pressure slightly elevated.  Will increase amlodipine from 5 mg to 10 mg and place on as needed clonidine.  History of depression/PTSD/bipolar 1 disorder: Per patient, he is not on any medications.  Needs to see psychiatry as outpatient.  History of obstructive sleep apnea: Per patient, he uses 2 L oxygen at night but no CPAP machine.  DVT prophylaxis:    Code Status: Full Code  Family Communication: None present at bedside.  Plan of care discussed with patient in length and he verbalized understanding and agreed with it.  Status is: Observation  The patient will require care spanning > 2 midnights and should be moved to inpatient because: Inpatient level of care appropriate due to severity of illness  Dispo: The patient is from: Home              Anticipated d/c is to: Home              Anticipated d/c date is: 1 day  Patient currently is not medically stable to d/c.        Estimated body mass index is 40.72 kg/m as calculated from the following:   Height as of this encounter: 5\' 7"  (1.702 m).   Weight as of this encounter: 117.9 kg.      Nutritional status:               Consultants:   None  Procedures:   None  Antimicrobials:  Anti-infectives (From admission, onward)   None         Subjective: Patient seen and examined.  Still in the ED.  Very sleepy but arousable.  Denies any complaint.  Fine upper extremity tremors.  Objective: Vitals:   08/23/20 0700 08/23/20 0830 08/23/20 0843 08/23/20 1000  BP: (!) 150/79 (!)  147/98  131/84  Pulse: 81 82  79  Resp: 15 19  19   Temp:   98.4 F (36.9 C)   TempSrc:   Oral   SpO2: 96% 90%  93%  Weight:      Height:        Intake/Output Summary (Last 24 hours) at 08/23/2020 1043 Last data filed at 08/22/2020 2336 Gross per 24 hour  Intake 500 ml  Output --  Net 500 ml   Filed Weights   08/22/20 1740  Weight: 117.9 kg    Examination:  General exam: Appears calm and comfortable but sleepy. Respiratory system: Clear to auscultation. Respiratory effort normal. Cardiovascular system: S1 & S2 heard, RRR. No JVD, murmurs, rubs, gallops or clicks. No pedal edema. Gastrointestinal system: Abdomen is nondistended, soft and nontender. No organomegaly or masses felt. Normal bowel sounds heard. Central nervous system: Alert and oriented. No focal neurological deficits. Extremities: Symmetric 5 x 5 power.  Fine bilateral upper extremity tremors. Skin: No rashes, lesions or ulcers    Data Reviewed: I have personally reviewed following labs and imaging studies  CBC: Recent Labs  Lab 08/22/20 0053 08/23/20 0636  WBC 6.9 6.4  HGB 14.0 12.7*  HCT 41.4 39.9  MCV 81.2 84.0  PLT 307 267   Basic Metabolic Panel: Recent Labs  Lab 08/22/20 0053 08/22/20 1829 08/23/20 0636  NA 138  --  136  K 3.1*  --  3.9  CL 99  --  100  CO2 23  --  26  GLUCOSE 100*  --  96  BUN <5*  --  8  CREATININE 0.83  --  0.84  CALCIUM 8.5*  --  7.8*  MG  --  2.2  --    GFR: Estimated Creatinine Clearance: 132 mL/min (by C-G formula based on SCr of 0.84 mg/dL). Liver Function Tests: Recent Labs  Lab 08/22/20 1829 08/23/20 0636  AST 30 37  ALT 18 14  ALKPHOS 94 78  BILITOT 2.1* 1.2  PROT 7.0 6.0*  ALBUMIN 3.7 3.2*   No results for input(s): LIPASE, AMYLASE in the last 168 hours. No results for input(s): AMMONIA in the last 168 hours. Coagulation Profile: No results for input(s): INR, PROTIME in the last 168 hours. Cardiac Enzymes: No results for input(s): CKTOTAL,  CKMB, CKMBINDEX, TROPONINI in the last 168 hours. BNP (last 3 results) No results for input(s): PROBNP in the last 8760 hours. HbA1C: No results for input(s): HGBA1C in the last 72 hours. CBG: No results for input(s): GLUCAP in the last 168 hours. Lipid Profile: No results for input(s): CHOL, HDL, LDLCALC, TRIG, CHOLHDL, LDLDIRECT in the last 72 hours. Thyroid Function Tests: No results for  input(s): TSH, T4TOTAL, FREET4, T3FREE, THYROIDAB in the last 72 hours. Anemia Panel: No results for input(s): VITAMINB12, FOLATE, FERRITIN, TIBC, IRON, RETICCTPCT in the last 72 hours. Sepsis Labs: No results for input(s): PROCALCITON, LATICACIDVEN in the last 168 hours.  Recent Results (from the past 240 hour(s))  Resp Panel by RT-PCR (Flu A&B, Covid) Nasopharyngeal Swab     Status: None   Collection Time: 08/22/20  6:29 PM   Specimen: Nasopharyngeal Swab; Nasopharyngeal(NP) swabs in vial transport medium  Result Value Ref Range Status   SARS Coronavirus 2 by RT PCR NEGATIVE NEGATIVE Final    Comment: (NOTE) SARS-CoV-2 target nucleic acids are NOT DETECTED.  The SARS-CoV-2 RNA is generally detectable in upper respiratory specimens during the acute phase of infection. The lowest concentration of SARS-CoV-2 viral copies this assay can detect is 138 copies/mL. A negative result does not preclude SARS-Cov-2 infection and should not be used as the sole basis for treatment or other patient management decisions. A negative result may occur with  improper specimen collection/handling, submission of specimen other than nasopharyngeal swab, presence of viral mutation(s) within the areas targeted by this assay, and inadequate number of viral copies(<138 copies/mL). A negative result must be combined with clinical observations, patient history, and epidemiological information. The expected result is Negative.  Fact Sheet for Patients:  BloggerCourse.comhttps://www.fda.gov/media/152166/download  Fact Sheet for  Healthcare Providers:  SeriousBroker.ithttps://www.fda.gov/media/152162/download  This test is no t yet approved or cleared by the [place] FDA and  has been authorized for detection and/or diagnosis of SARS-CoV-2 by FDA under an Emergency Use Authorization (EUA). This EUA will remain  in effect (meaning this test can be used) for the duration of the COVID-19 declaration under Section 564(b)(1) of the Act, 21 U.S.C.section 360bbb-3(b)(1), unless the authorization is terminated  or revoked sooner.       Influenza A by PCR NEGATIVE NEGATIVE Final   Influenza B by PCR NEGATIVE NEGATIVE Final    Comment: (NOTE) The Xpert Xpress SARS-CoV-2/FLU/RSV plus assay is intended as an aid in the diagnosis of influenza from Nasopharyngeal swab specimens and should not be used as a sole basis for treatment. Nasal washings and aspirates are unacceptable for Xpert Xpress SARS-CoV-2/FLU/RSV testing.  Fact Sheet for Patients: BloggerCourse.comhttps://www.fda.gov/media/152166/download  Fact Sheet for Healthcare Providers: SeriousBroker.ithttps://www.fda.gov/media/152162/download  This test is not yet approved or cleared by the [place] FDA and has been authorized for detection and/or diagnosis of SARS-CoV-2 by FDA under an Emergency Use Authorization (EUA). This EUA will remain in effect (meaning this test can be used) for the duration of the COVID-19 declaration under Section 564(b)(1) of the Act, 21 U.S.C. section 360bbb-3(b)(1), unless the authorization is terminated or revoked.  Performed at [facility], [address] [place], [place] [address]       Radiology Studies: DG Chest Port 1 View  Result Date: 08/22/2020 CLINICAL DATA:  Cough and wheezing EXAM: PORTABLE CHEST 1 VIEW COMPARISON:  06/17/2020 FINDINGS: Single frontal view of the chest demonstrates an unremarkable cardiac silhouette. Continued ectasia of the thoracic aorta. There is no airspace disease, effusion, or pneumothorax. No acute bony abnormalities.  IMPRESSION: 1. No acute intrathoracic process. Electronically Signed   By: Sharlet SalinaMichael  Brown M.D.   On: 08/22/2020 19:10    Scheduled Meds: . amLODipine  10 mg Oral Daily  . LORazepam  0-4 mg Intravenous Q6H   Or  . LORazepam  0-4 mg Oral Q6H  . [START ON 08/25/2020] LORazepam  0-4 mg Intravenous Q12H  Or  . [START ON 08/25/2020] LORazepam  0-4 mg Oral Q12H  . sodium chloride flush  3 mL Intravenous Q12H  . thiamine  100 mg Oral Daily   Or  . thiamine  100 mg Intravenous Daily   Continuous Infusions:   LOS: 0 days   Time spent: 35 minutes   Hughie Closs, MD [facility]  08/23/2020, 10:43 AM   To contact the attending provider between 7A-7P or the covering provider during after hours 7P-7A, please log into the web site [email or web address].

## 2020-08-24 NOTE — Discharge Summary (Signed)
Physician Discharge Summary  [name] KGM:[id number] DOB: 03/26/1971 DOA: 08/22/2020  PCP: Patient, No Pcp Per  Admit date: 08/22/2020 Discharge date: 08/24/2020  Admitted From: Home Disposition: Left AGAINST MEDICAL ADVICE  Recommendations for Outpatient Follow-up:  1. Follow up with PCP in 1-2 weeks 2. Please obtain BMP/CBC in one week 3. Please follow up with your PCP on the following pending results: Unresulted Labs (From admission, onward)         None       Home Health: None Equipment/Devices: None  Discharge Condition: Hemodynamically stable but at risk of further withdrawal leading to complications CODE STATUS: None Diet recommendation: Previous home diet  Subjective: Patient was seen and examined early morning of 08/23/2020.  Patient was little sleepy but easily arousable and oriented once awake and was having good conversation but did have some withdrawal symptoms with upper extremity fine tremors.  UYQ:IHKVQ A Burtonis a 49 y.o.malewith medical history significant ofalcohol use, bipolar 1, obesity, depression, PTSD, cocaine use who presents with worsening anxiety and tremor concern for alcohol withdrawal. Patient was seen in the ED on 08/2017 after he reported relapse of cocaine and alcohol use. Prior to this he had been at a rehab center and vomiting for about 60 days. He presents today saying he has been on a bender for several days drinking at least 1040 ounce beers and multiple cans of beer as well. He also reports some sharp chest pain that he rates as 8 out of 10 that radiates to his neck. Chest pain began 5 or 10 minutes after cocaine use. No chest pain at this time.He states his last alcohol use was 6 PM yesterday. He has had multiple hospitalizations for withdrawal in the past. He denies fever, chills, SOB,abdominal pain, nausea, aspiration, diarrhea. Reports he has chronic edema, which improveswith leg elevation.  ED Course:Vitals in ED  significant for tachycardia to the 100s, blood pressure 145 160 systolic. Lab work showed BMP with hypokalemia of 3.1. LFTs showed calcium of 8.5 and T bili 2.1. CBC within normal limits. Respiratory panel for flu and Covid negative. Troponin negative x2. UDS positive for cocaine. UA pending. EtOH level pending. Patient given Ativan,cough I am and some IV fluids in ED.  Brief/Interim Summary: Patient was admitted to hospital service due to alcohol withdrawal.  He was started on CIWA protocol with as needed Ativan.  His CIWA was between 5 and 10.  Suddenly in the evening, patient decided to leave AGAINST MEDICAL ADVICE.  Discharge Diagnoses:  Principal Problem:   Alcohol withdrawal (HCC) Active Problems:   Hypertension   Post traumatic stress disorder (PTSD)   Cocaine dependence with cocaine-induced mood disorder (HCC)   Alcohol abuse with alcohol-induced mood disorder (HCC)   Cocaine abuse (HCC)   Bipolar 1 disorder (HCC)   MDD (major depressive disorder), recurrent, severe, with psychosis (HCC)    Discharge Instructions   Allergies as of 08/23/2020   No Known Allergies     Medication List    ASK your doctor about these medications   amLODipine 5 MG tablet Commonly known as: NORVASC Take 1 tablet (5 mg total) by mouth daily.   citalopram 20 MG tablet Commonly known as: CELEXA Take 1 tablet (20 mg total) by mouth daily.   cloNIDine 0.2 MG tablet Commonly known as: CATAPRES Take 0.2 mg by mouth 3 (three) times daily as needed.   doxepin 10 MG capsule Commonly known as: SINEQUAN Take 10 mg by mouth at bedtime.   hydrOXYzine  50 MG tablet Commonly known as: ATARAX/VISTARIL Take 1 tablet (50 mg total) by mouth 3 (three) times daily as needed for anxiety.   prazosin 1 MG capsule Commonly known as: MINIPRESS Take 1 capsule (1 mg total) by mouth at bedtime.   QUEtiapine 300 MG tablet Commonly known as: SEROQUEL Take 1 tablet (300 mg total) by mouth at bedtime.        No Known Allergies  Consultations: None   Procedures/Studies: DG Chest Port 1 View  Result Date: 08/22/2020 CLINICAL DATA:  Cough and wheezing EXAM: PORTABLE CHEST 1 VIEW COMPARISON:  06/17/2020 FINDINGS: Single frontal view of the chest demonstrates an unremarkable cardiac silhouette. Continued ectasia of the thoracic aorta. There is no airspace disease, effusion, or pneumothorax. No acute bony abnormalities. IMPRESSION: 1. No acute intrathoracic process. Electronically Signed   By: Sharlet Salina M.D.   On: 08/22/2020 19:10      Discharge Exam: Vitals:   08/23/20 1545 08/23/20 1800  BP: 128/72 (!) 145/95  Pulse: 89 (!) 101  Resp:  18  Temp:  98.7 F (37.1 C)  SpO2:  96%   Vitals:   08/23/20 1415 08/23/20 1515 08/23/20 1545 08/23/20 1800  BP: 138/78 (!) 145/75 128/72 (!) 145/95  Pulse: 92 75 89 (!) 101  Resp: (!) 24 14  18   Temp:    98.7 F (37.1 C)  TempSrc:    Oral  SpO2: 95% 99%  96%  Weight:      Height:       Patient had left the hospital before I was able to get the patient but I did examine the patient in the morning, please refer to my progress note on the same date.    The results of significant diagnostics from this hospitalization (including imaging, microbiology, ancillary and laboratory) are listed below for reference.     Microbiology: Recent Results (from the past 240 hour(s))  Resp Panel by RT-PCR (Flu A&B, Covid) Nasopharyngeal Swab     Status: None   Collection Time: 08/22/20  6:29 PM   Specimen: Nasopharyngeal Swab; Nasopharyngeal(NP) swabs in vial transport medium  Result Value Ref Range Status   SARS Coronavirus 2 by RT PCR NEGATIVE NEGATIVE Final    Comment: (NOTE) SARS-CoV-2 target nucleic acids are NOT DETECTED.  The SARS-CoV-2 RNA is generally detectable in upper respiratory specimens during the acute phase of infection. The lowest concentration of SARS-CoV-2 viral copies this assay can detect is 138 copies/mL. A negative  result does not preclude SARS-Cov-2 infection and should not be used as the sole basis for treatment or other patient management decisions. A negative result may occur with  improper specimen collection/handling, submission of specimen other than nasopharyngeal swab, presence of viral mutation(s) within the areas targeted by this assay, and inadequate number of viral copies(<138 copies/mL). A negative result must be combined with clinical observations, patient history, and epidemiological information. The expected result is Negative.  Fact Sheet for Patients:  08/24/20  Fact Sheet for Healthcare Providers:  BloggerCourse.com  This test is no t yet approved or cleared by the [place] FDA and  has been authorized for detection and/or diagnosis of SARS-CoV-2 by FDA under an Emergency Use Authorization (EUA). This EUA will remain  in effect (meaning this test can be used) for the duration of the COVID-19 declaration under Section 564(b)(1) of the Act, 21 U.S.C.section 360bbb-3(b)(1), unless the authorization is terminated  or revoked sooner.       Influenza A by PCR NEGATIVE NEGATIVE  Final   Influenza B by PCR NEGATIVE NEGATIVE Final    Comment: (NOTE) The Xpert Xpress SARS-CoV-2/FLU/RSV plus assay is intended as an aid in the diagnosis of influenza from Nasopharyngeal swab specimens and should not be used as a sole basis for treatment. Nasal washings and aspirates are unacceptable for Xpert Xpress SARS-CoV-2/FLU/RSV testing.  Fact Sheet for Patients: BloggerCourse.com  Fact Sheet for Healthcare Providers: SeriousBroker.it  This test is not yet approved or cleared by the [place] FDA and has been authorized for detection and/or diagnosis of SARS-CoV-2 by FDA under an Emergency Use Authorization (EUA). This EUA will remain in effect (meaning this test can be used)  for the duration of the COVID-19 declaration under Section 564(b)(1) of the Act, 21 U.S.C. section 360bbb-3(b)(1), unless the authorization is terminated or revoked.  Performed at [facility], [address] [place], [place] [address]      Labs: BNP (last 3 results) No results for input(s): BNP in the last 8760 hours. Basic Metabolic Panel: Recent Labs  Lab 08/22/20 0053 08/22/20 1829 08/23/20 0636  NA 138  --  136  K 3.1*  --  3.9  CL 99  --  100  CO2 23  --  26  GLUCOSE 100*  --  96  BUN <5*  --  8  CREATININE 0.83  --  0.84  CALCIUM 8.5*  --  7.8*  MG  --  2.2  --    Liver Function Tests: Recent Labs  Lab 08/22/20 1829 08/23/20 0636  AST 30 37  ALT 18 14  ALKPHOS 94 78  BILITOT 2.1* 1.2  PROT 7.0 6.0*  ALBUMIN 3.7 3.2*   No results for input(s): LIPASE, AMYLASE in the last 168 hours. No results for input(s): AMMONIA in the last 168 hours. CBC: Recent Labs  Lab 08/22/20 0053 08/23/20 0636  WBC 6.9 6.4  HGB 14.0 12.7*  HCT 41.4 39.9  MCV 81.2 84.0  PLT 307 267   Cardiac Enzymes: No results for input(s): CKTOTAL, CKMB, CKMBINDEX, TROPONINI in the last 168 hours. BNP: Invalid input(s): POCBNP CBG: No results for input(s): GLUCAP in the last 168 hours. D-Dimer No results for input(s): DDIMER in the last 72 hours. Hgb A1c No results for input(s): HGBA1C in the last 72 hours. Lipid Profile No results for input(s): CHOL, HDL, LDLCALC, TRIG, CHOLHDL, LDLDIRECT in the last 72 hours. Thyroid function studies No results for input(s): TSH, T4TOTAL, T3FREE, THYROIDAB in the last 72 hours.  Invalid input(s): FREET3 Anemia work up No results for input(s): VITAMINB12, FOLATE, FERRITIN, TIBC, IRON, RETICCTPCT in the last 72 hours. Urinalysis    Component Value Date/Time   COLORURINE YELLOW 07/16/2019 1740   APPEARANCEUR HAZY (A) 07/16/2019 1740   LABSPEC 1.011 07/16/2019 1740   PHURINE 8.0 07/16/2019 1740   GLUCOSEU NEGATIVE 07/16/2019 1740    HGBUR NEGATIVE 07/16/2019 1740   BILIRUBINUR NEGATIVE 07/16/2019 1740   KETONESUR NEGATIVE 07/16/2019 1740   PROTEINUR NEGATIVE 07/16/2019 1740   UROBILINOGEN 0.2 04/09/2015 0206   NITRITE NEGATIVE 07/16/2019 1740   LEUKOCYTESUR NEGATIVE 07/16/2019 1740   Sepsis Labs Invalid input(s): PROCALCITONIN,  WBC,  LACTICIDVEN Microbiology Recent Results (from the past 240 hour(s))  Resp Panel by RT-PCR (Flu A&B, Covid) Nasopharyngeal Swab     Status: None   Collection Time: 08/22/20  6:29 PM   Specimen: Nasopharyngeal Swab; Nasopharyngeal(NP) swabs in vial transport medium  Result Value Ref Range Status   SARS Coronavirus 2 by RT PCR NEGATIVE  NEGATIVE Final    Comment: (NOTE) SARS-CoV-2 target nucleic acids are NOT DETECTED.  The SARS-CoV-2 RNA is generally detectable in upper respiratory specimens during the acute phase of infection. The lowest concentration of SARS-CoV-2 viral copies this assay can detect is 138 copies/mL. A negative result does not preclude SARS-Cov-2 infection and should not be used as the sole basis for treatment or other patient management decisions. A negative result may occur with  improper specimen collection/handling, submission of specimen other than nasopharyngeal swab, presence of viral mutation(s) within the areas targeted by this assay, and inadequate number of viral copies(<138 copies/mL). A negative result must be combined with clinical observations, patient history, and epidemiological information. The expected result is Negative.  Fact Sheet for Patients:  BloggerCourse.comhttps://www.fda.gov/media/152166/download  Fact Sheet for Healthcare Providers:  SeriousBroker.ithttps://www.fda.gov/media/152162/download  This test is no t yet approved or cleared by the [place] FDA and  has been authorized for detection and/or diagnosis of SARS-CoV-2 by FDA under an Emergency Use Authorization (EUA). This EUA will remain  in effect (meaning this test can be used) for the duration of  the COVID-19 declaration under Section 564(b)(1) of the Act, 21 U.S.C.section 360bbb-3(b)(1), unless the authorization is terminated  or revoked sooner.       Influenza A by PCR NEGATIVE NEGATIVE Final   Influenza B by PCR NEGATIVE NEGATIVE Final    Comment: (NOTE) The Xpert Xpress SARS-CoV-2/FLU/RSV plus assay is intended as an aid in the diagnosis of influenza from Nasopharyngeal swab specimens and should not be used as a sole basis for treatment. Nasal washings and aspirates are unacceptable for Xpert Xpress SARS-CoV-2/FLU/RSV testing.  Fact Sheet for Patients: BloggerCourse.comhttps://www.fda.gov/media/152166/download  Fact Sheet for Healthcare Providers: SeriousBroker.ithttps://www.fda.gov/media/152162/download  This test is not yet approved or cleared by the [place] FDA and has been authorized for detection and/or diagnosis of SARS-CoV-2 by FDA under an Emergency Use Authorization (EUA). This EUA will remain in effect (meaning this test can be used) for the duration of the COVID-19 declaration under Section 564(b)(1) of the Act, 21 U.S.C. section 360bbb-3(b)(1), unless the authorization is terminated or revoked.  Performed at [facility], [address] [place], [place] [address]      Time coordinating discharge: Over 30 minutes  SIGNED:   Hughie Clossavi Ebbie Sorenson, MD  [facility] 08/24/2020, 6:47 AM  If 7PM-7AM, please contact night-coverage [email or web address]

## 2020-09-04 ENCOUNTER — Encounter (HOSPITAL_COMMUNITY): Payer: Self-pay | Admitting: Emergency Medicine

## 2020-09-04 DIAGNOSIS — J45909 Unspecified asthma, uncomplicated: Secondary | ICD-10-CM | POA: Insufficient documentation

## 2020-09-04 DIAGNOSIS — F10239 Alcohol dependence with withdrawal, unspecified: Secondary | ICD-10-CM | POA: Diagnosis present

## 2020-09-04 DIAGNOSIS — Z79899 Other long term (current) drug therapy: Secondary | ICD-10-CM | POA: Insufficient documentation

## 2020-09-04 DIAGNOSIS — I10 Essential (primary) hypertension: Secondary | ICD-10-CM | POA: Diagnosis not present

## 2020-09-04 NOTE — ED Triage Notes (Signed)
Per EMS-has been drinking ETOH since Saturday-last drink was 10 am-doesn't feel good

## 2020-09-05 ENCOUNTER — Telehealth (HOSPITAL_COMMUNITY): Payer: Self-pay | Admitting: Emergency Medicine

## 2020-09-05 ENCOUNTER — Emergency Department (HOSPITAL_COMMUNITY)
Admission: EM | Admit: 2020-09-05 | Discharge: 2020-09-05 | Disposition: A | Discharge status: Home / Self Care | Payer: Medicare Other | Attending: Emergency Medicine | Admitting: Emergency Medicine

## 2020-09-05 DIAGNOSIS — F10230 Alcohol dependence with withdrawal, uncomplicated: Secondary | ICD-10-CM

## 2020-09-05 DIAGNOSIS — F10930 Alcohol use, unspecified with withdrawal, uncomplicated: Secondary | ICD-10-CM

## 2020-09-05 DIAGNOSIS — F10239 Alcohol dependence with withdrawal, unspecified: Secondary | ICD-10-CM | POA: Diagnosis not present

## 2020-09-05 MED ORDER — CHLORDIAZEPOXIDE HCL 25 MG PO CAPS
ORAL_CAPSULE | ORAL | 0 refills | Status: DC
Start: 2020-09-05 — End: 2020-09-21

## 2020-09-05 MED ORDER — ONDANSETRON HCL 4 MG/2ML IJ SOLN
4.0000 mg | Freq: Once | INTRAMUSCULAR | Status: AC
  Administered 2020-09-05: 4 mg via INTRAVENOUS
  Filled 2020-09-05: qty 2

## 2020-09-05 MED ORDER — HYDROXYZINE HCL 50 MG PO TABS
50.0000 mg | ORAL_TABLET | Freq: Three times a day (TID) | ORAL | 0 refills | Status: DC | PRN
Start: 2020-09-05 — End: 2021-09-22

## 2020-09-05 MED ORDER — AMLODIPINE BESYLATE 5 MG PO TABS
5.0000 mg | ORAL_TABLET | Freq: Every day | ORAL | 0 refills | Status: DC
Start: 2020-09-05 — End: 2022-08-08

## 2020-09-05 MED ORDER — PRAZOSIN HCL 1 MG PO CAPS
1.0000 mg | ORAL_CAPSULE | Freq: Every day | ORAL | 0 refills | Status: DC
Start: 2020-09-05 — End: 2021-08-31

## 2020-09-05 MED ORDER — CITALOPRAM HYDROBROMIDE 20 MG PO TABS: 20.0000 mg | ORAL_TABLET | Freq: Every day | ORAL | 0 refills | Status: DC

## 2020-09-05 MED ORDER — CHLORDIAZEPOXIDE HCL 25 MG PO CAPS
50.0000 mg | ORAL_CAPSULE | Freq: Once | ORAL | Status: AC
  Administered 2020-09-05: 50 mg via ORAL
  Filled 2020-09-05: qty 2

## 2020-09-05 MED ORDER — THIAMINE HCL 100 MG/ML IJ SOLN
Freq: Once | INTRAVENOUS | Status: AC
  Filled 2020-09-05: qty 1000

## 2020-09-05 MED ORDER — CLONIDINE HCL 0.2 MG PO TABS
0.2000 mg | ORAL_TABLET | Freq: Three times a day (TID) | ORAL | 0 refills | Status: DC | PRN
Start: 2020-09-05 — End: 2023-01-05

## 2020-09-05 MED ORDER — ACETAMINOPHEN 500 MG PO TABS
1000.0000 mg | ORAL_TABLET | Freq: Once | ORAL | Status: AC
  Administered 2020-09-05: 1000 mg via ORAL
  Filled 2020-09-05: qty 2

## 2020-09-05 MED ORDER — QUETIAPINE FUMARATE 300 MG PO TABS: 300.0000 mg | ORAL_TABLET | Freq: Every day | ORAL | 0 refills | Status: DC

## 2020-09-05 MED ORDER — DOXEPIN HCL 10 MG PO CAPS: 10.0000 mg | ORAL_CAPSULE | Freq: Every day | ORAL | 0 refills | Status: DC

## 2020-09-05 MED ORDER — CHLORDIAZEPOXIDE HCL 25 MG PO CAPS
25.0000 mg | ORAL_CAPSULE | Freq: Once | ORAL | Status: AC
  Administered 2020-09-05: 25 mg via ORAL
  Filled 2020-09-05: qty 1

## 2020-09-05 MED ORDER — CHLORDIAZEPOXIDE HCL 25 MG PO CAPS: ORAL_CAPSULE | ORAL | 0 refills | Status: DC

## 2020-09-05 NOTE — Progress Notes (Signed)
09/05/2020 @ 2:30 pm  TOC CM/CSW went to pharmacy to pick pts meds.  CSW explain [employer]ider's Waiver to pt.  Pt signed Riders Waiver.  CSW contacted [employer].  Safe Transport was scheduled for 3pm.  CSW will continue to follow for dc needs.  Alija Riano Tarpley-Carter, MSW, LCSW-A Pronouns:  She, Her, Hers                  [facility] ED Transitions of CareClinical Social Worker [email or web address] [phone]

## 2020-09-05 NOTE — Patient Outreach (Signed)
CPSS assisted Child psychotherapist with facilitating Pt at this time. CPSS also was able retrieve socks an underwear that Pt need also to attend the facility.

## 2020-09-05 NOTE — ED Notes (Signed)
Pt very tremulous when updating v/s and states he is feeling worse. Triage RN notified.

## 2020-09-05 NOTE — Progress Notes (Signed)
  MATCH Medication Assistance Card  Name: [name] (MRN): [id number] Bin: [id number] RX Group: [id number]  Discharge Date:09/05/2020 11:07 AM  Expiration Date:09/13/2020  (must be filled within 7 days of discharge)     Dear [name]:  You have been approved to have the prescriptions written by your discharging physician filled through our [facility] (Medication Assistance Through [facility]) program. This program allows for a one-time (no refills) 34-day supply of selected medications for a low copay amount.  The copay is $00 per prescription. For instance, if you have one prescription, you will pay $00; for two prescriptions, you pay $00; for three prescriptions, you pay $00; and so on.  Only certain pharmacies are participating in this program with [facility]. You will need to select one of the pharmacies from the attached list and take your prescriptions, this letter, and your photo ID to one of the participating pharmacies.   We are excited that you are able to use the [facility] program to get your medications. These prescriptions must be filled within 7 days of hospital discharge or they will no longer be valid for the [facility] program. Should you have any problems with your prescriptions please contact your case management team member at [phone] for [facility].   Thank you,    [facility]

## 2020-09-05 NOTE — Telephone Encounter (Signed)
Transitions of care recommended transfer of prescriptions to [facility] outpatient pharmacy.  Reviewed records and wrote for the medicines that he was supposed to be on.

## 2020-09-05 NOTE — Progress Notes (Signed)
TOC CM completed MATCH letter with $0 copay. Pt did not have medication coverage and going to rehab for detox. CSW picked up meds from pharmacy. Pt was provided meds prior to transport to rehab program. Isidoro Donning RN CCM, [facility] ED TOC CM [phone]

## 2020-09-05 NOTE — ED Notes (Signed)
Pt has visible tremors. States he is "seeing a lot of terrible faces. This is the worst I've ever felt."

## 2020-09-05 NOTE — Progress Notes (Signed)
..   Transition of Care [facility]) - Emergency Department Mini Assessment   Patient Details  Name: [name] MRN: [id number] Date of Birth: 12/26/1970  Transition of Care [facility]) CM/SW Contact:    Duvan Mousel C Tarpley-Carter, LCSWA Phone Number: 09/05/2020, 2:28 PM   Clinical Narrative: [facility] CM/CSW consulted with pt about his needs for homeless resources.  Pt also states he is experiencing alcohol withdrawals and is in need of rehab.  Kadeen Sroka Tarpley-Carter, MSW, LCSW-A Pronouns:  She, Her, Hers                  [facility] ED Transitions of CareClinical Social Worker [email or web address] [phone]   ED Mini Assessment: What brought you to the Emergency Department? : Alcohol Intoxication  Barriers to Discharge: No Barriers Identified     Means of departure: Public Transportation  Interventions which prevented an admission or readmission: Homeless Screening,Other (must enter comment) (Substance Use)    Patient Contact and Communications       Contact Date: 09/05/20,          Patient states their goals for this hospitalization and ongoing recovery are:: Pt wants to go to Rehab.   Choice offered to / list presented to : Patient  Admission diagnosis:  ETOH withdrawal Patient Active Problem List   Diagnosis Date Noted  . Alcohol withdrawal (HCC) 08/22/2020  . Bipolar 1 disorder (HCC) 06/20/2020  . MDD (major depressive disorder), recurrent, severe, with psychosis (HCC) 06/20/2020  . Cocaine abuse (HCC) 06/14/2020  . Class 2 obesity due to excess calories with body mass index (BMI) of 39.0 to 39.9 in adult 06/14/2020  . Alcohol abuse with alcohol-induced mood disorder (HCC) 01/18/2017  . Cocaine dependence with cocaine-induced mood disorder (HCC)   . Hypertension 09/08/2011  . Post traumatic stress disorder (PTSD) 09/08/2011   PCP:  Patient, No Pcp Per Pharmacy:   [facility] - [place], [place] - [address] [place] [address] [place] [address] Phone: [phone] Fax: [phone]

## 2020-09-05 NOTE — Progress Notes (Signed)
TOC CM/CSW contacted [facility].  [facility] has available beds, asked that pts info be faxed.  CSW also contacted [facility] in [place].  [facility] has available beds, its first come first served.  Isidoro Donning, RN/CM is working to get pts meds so that he can take them with him.  CSW will continue to follow for dc needs.  Mattie Nordell Tarpley-Carter, MSW, LCSW-A Pronouns:  She, Her, Hers                  [facility] Transitions of CareClinical Social Worker [email or web address] [phone]

## 2020-09-05 NOTE — ED Provider Notes (Signed)
[facility]-EMERGENCY DEPT Provider Note   CSN: [id number] Arrival date & time: 09/04/20  1809     History Chief Complaint  Patient presents with  . Alcohol Problem    [name] is a 50 y.o. male.  History of alcohol withdrawal.  Symptoms with perceptual disturbances but likely related to his mental health issues.  Patient states that he was binge drinking for 5 days.  Stopped a couple days ago is been feeling bad since then.  He wants to be admitted for alcohol detoxification.  Of note patient has relapsed many many times in the past from alcohol treatments.  There is no documented instance of him having any withdrawal seizures or delirium tremens in the computer.  He has been admitted mostly for dehydration and normal withdrawal symptoms in the past.  He has not been sick recently.  States he has dry mouth and shakes at this time.   Alcohol Problem This is a chronic problem. The current episode started more than 1 week ago. The problem occurs constantly. The problem has not changed since onset.Pertinent negatives include no chest pain, no abdominal pain, no headaches and no shortness of breath.       Past Medical History:  Diagnosis Date  . Alcohol dependence with withdrawal (HCC)   . Anxiety   . Asthma   . Atrial fibrillation (HCC)    not on AC  . Bipolar 1 disorder (HCC)   . Class 2 obesity   . Cocaine abuse (HCC)   . Depression   . Heart palpitations    with severe anxiety  . Hypertension   . PTSD (post-traumatic stress disorder)   . PTSD (post-traumatic stress disorder)     Patient Active Problem List   Diagnosis Date Noted  . Alcohol withdrawal (HCC) 08/22/2020  . Bipolar 1 disorder (HCC) 06/20/2020  . MDD (major depressive disorder), recurrent, severe, with psychosis (HCC) 06/20/2020  . Cocaine abuse (HCC) 06/14/2020  . Class 2 obesity due to excess calories with body mass index (BMI) of 39.0 to 39.9 in adult 06/14/2020  . Alcohol  abuse with alcohol-induced mood disorder (HCC) 01/18/2017  . Cocaine dependence with cocaine-induced mood disorder (HCC)   . Hypertension 09/08/2011  . Post traumatic stress disorder (PTSD) 09/08/2011    Past Surgical History:  Procedure Laterality Date  . NO PAST SURGERIES         Family History  Problem Relation Age of Onset  . Alcohol abuse Mother   . Hypertension Father   . Dementia Father   . Alcohol abuse Maternal Grandfather     Social History   Tobacco Use  . Smoking status: Never Smoker  . Smokeless tobacco: Never Used  Vaping Use  . Vaping Use: Never used  Substance Use Topics  . Alcohol use: Yes    Comment: reports that his last drink was last Wednesday when he had 4- 24oz of liquor, said that 4 days out of the wk he drinks 4-24 ounce beers  . Drug use: Yes    Types: Cocaine    Comment: last use was "last Wednesday" and said that he uses a "minimal" amount, 1st time he used it since 2016    Home Medications Prior to Admission medications   Medication Sig Start Date End Date Taking? Authorizing Provider  amLODipine (NORVASC) 5 MG tablet Take 1 tablet (5 mg total) by mouth daily. 06/26/20 06/26/21  Aldean Baker, NP  citalopram (CELEXA) 20 MG tablet Take 1  tablet (20 mg total) by mouth daily. Patient not taking: No sig reported 06/27/20   Connye Burkitt, NP  cloNIDine (CATAPRES) 0.2 MG tablet Take 0.2 mg by mouth 3 (three) times daily as needed. 07/17/20   [provider]  doxepin (SINEQUAN) 10 MG capsule Take 10 mg by mouth at bedtime. 08/20/20   [provider]  hydrOXYzine (ATARAX/VISTARIL) 50 MG tablet Take 1 tablet (50 mg total) by mouth 3 (three) times daily as needed for anxiety. Patient not taking: No sig reported 06/26/20   Connye Burkitt, NP  prazosin (MINIPRESS) 1 MG capsule Take 1 capsule (1 mg total) by mouth at bedtime. Patient not taking: No sig reported 06/26/20   Connye Burkitt, NP  QUEtiapine (SEROQUEL) 300 MG tablet Take 1  tablet (300 mg total) by mouth at bedtime. Patient not taking: No sig reported 06/26/20   Connye Burkitt, NP    Allergies    Patient has no known allergies.  Review of Systems   Review of Systems  Respiratory: Negative for shortness of breath.   Cardiovascular: Negative for chest pain.  Gastrointestinal: Negative for abdominal pain.  Neurological: Negative for headaches.  All other systems reviewed and are negative.   Physical Exam Updated Vital Signs BP (!) 162/132   Pulse 98   Temp 99.3 F (37.4 C) (Oral)   Resp 18   Ht 5\' 6"  (1.676 m)   Wt 113.4 kg   SpO2 98%   BMI 40.35 kg/m   Physical Exam Vitals and nursing note reviewed.  Constitutional:      Appearance: He is well-developed and well-nourished.  HENT:     Head: Normocephalic and atraumatic.     Nose: Nose normal. No congestion or rhinorrhea.     Mouth/Throat:     Mouth: Mucous membranes are moist.     Pharynx: Oropharynx is clear.  Eyes:     Pupils: Pupils are equal, round, and reactive to light.  Cardiovascular:     Rate and Rhythm: Normal rate.  Pulmonary:     Effort: Pulmonary effort is normal. No respiratory distress.  Abdominal:     General: There is no distension.  Musculoskeletal:        General: Normal range of motion.     Cervical back: Normal range of motion.  Skin:    General: Skin is warm and dry.  Neurological:     General: No focal deficit present.     Mental Status: He is alert.     Comments: Slightly tremulous, otherwise no gross abnormalities     ED Results / Procedures / Treatments   Labs (all labs ordered are listed, but only abnormal results are displayed) Labs Reviewed - No data to display  EKG None  Radiology No results found.  Procedures Procedures (including critical care time)  Medications Ordered in ED Medications  chlordiazePOXIDE (LIBRIUM) capsule 50 mg (has no administration in time range)  sodium chloride 0.9 % 1,000 mL with thiamine 884 mg, folic acid 1  mg, multivitamins adult 10 mL infusion (has no administration in time range)  ondansetron (ZOFRAN) injection 4 mg (has no administration in time range)    ED Course  I have reviewed the triage vital signs and the nursing notes.  Pertinent labs & imaging results that were available during my care of the patient were reviewed by me and considered in my medical decision making (see chart for details).    MDM Rules/Calculators/A&P  Will treat symptomatically. Consult PEER support. Will provide with outpatient resources upon discharge.  Observed in ER for multiple hours without evidence of complicated etoh withdrawal. Will dc on librium taper and outpatient resources.   Final Clinical Impression(s) / ED Diagnoses Final diagnoses:  None    Rx / DC Orders ED Discharge Orders    None       Jonta Gastineau, Barbara Cower, MD 09/05/20 [phone]

## 2020-09-06 ENCOUNTER — Telehealth: Payer: Self-pay | Admitting: *Deleted

## 2020-09-06 ENCOUNTER — Telehealth: Payer: Self-pay | Admitting: General Practice

## 2020-09-06 NOTE — Telephone Encounter (Signed)
TOC CM received a call from pt's and pt's brother, Vedant Shehadeh. States he is very upset that [facility] did not accept pt for rehab. They did not complete his assessment until 12 midnight after they had given him his medications for the night. He was very groggy and unable to answer questions the way they felt appropriate. Brother feels they were unfair to him to try to ask him questions after was given medications that causes him to sleep at night. He went to [facility] to pick pt up from program. He plans to file a formal report.    Isidoro Donning RN CCM, [facility] ED TOC CM [phone]

## 2020-09-06 NOTE — Telephone Encounter (Signed)
CSW received phone call from patient's brother [name] [phone].  Patient's brother stated that he took patient out of [facility] in [place] today.  Per patient's brother, the facility did not assess patient till after midnight.  Patient's brother stated that because of the care that patient was receiving at facility, this CSW should try to find a new placement for patient.  CSW informed patient's brother that normally the case managers at the hospital does not find placement for patients for substance abuse treatment.  Usually resources are given to patient and family and they have to find placement for patient.  CSW also stated that once the patient leaves the hospital, CSW does not continue to follow patients at facilities or in the community.  Patient's brother insisted that CSW find new placement for patient because patient and brother were not happy with care being provided at the rehab facility.  CSW offered several times to either text or email a list of substance abuse treatment centers.  Patient's brother then started getting verbally aggressive and yelling at this CSW saying that CSW needs to find new placement for patient.  CSW again reiterated that CSW will not be able to find new placement for patient.  Patient's brother then continued to yell at this CSW and demanding name and phone number of [facility] worker that helped discharge patient yesterday to facility.  This CSW provided the name of the [facility] worker that was following patient yesterday, but informed patient that this CSW will not give the phone number to him.  Patient's brother then demanded name of this CSW supervisor and the phone number for her.  CSW informed patient's brother that the phone number will not be given to him, CSW asked patient's brother for his phone number so CSW can have [facility] worker and supervisor call him back.  Patient's brother then started yelling at this CSW again, and demanded for the supervisor's phone number, CSW  reiterated, that supervisor can call patient's brother back.  Patient's brother then hung up on this CSW.  CSW relayed information to [facility] RN case manager and social worker who worked yesterday in case he calls back.  CSW passed information to [facility] RN case manager to follow up with patient's brother.

## 2020-09-18 ENCOUNTER — Emergency Department (HOSPITAL_COMMUNITY)
Admission: EM | Admit: 2020-09-18 | Discharge: 2020-09-19 | Disposition: A | Discharge status: Home / Self Care | Payer: Medicare Other | Attending: Emergency Medicine | Admitting: Emergency Medicine

## 2020-09-18 ENCOUNTER — Other Ambulatory Visit: Payer: Self-pay

## 2020-09-18 ENCOUNTER — Emergency Department (HOSPITAL_COMMUNITY): Payer: Medicare Other

## 2020-09-18 ENCOUNTER — Encounter (HOSPITAL_COMMUNITY): Payer: Self-pay | Admitting: Emergency Medicine

## 2020-09-18 DIAGNOSIS — F4310 Post-traumatic stress disorder, unspecified: Secondary | ICD-10-CM | POA: Insufficient documentation

## 2020-09-18 DIAGNOSIS — I10 Essential (primary) hypertension: Secondary | ICD-10-CM | POA: Insufficient documentation

## 2020-09-18 DIAGNOSIS — J45909 Unspecified asthma, uncomplicated: Secondary | ICD-10-CM | POA: Insufficient documentation

## 2020-09-18 DIAGNOSIS — R519 Headache, unspecified: Secondary | ICD-10-CM | POA: Insufficient documentation

## 2020-09-18 DIAGNOSIS — Z7289 Other problems related to lifestyle: Secondary | ICD-10-CM

## 2020-09-18 DIAGNOSIS — Z751 Person awaiting admission to adequate facility elsewhere: Secondary | ICD-10-CM | POA: Insufficient documentation

## 2020-09-18 DIAGNOSIS — F332 Major depressive disorder, recurrent severe without psychotic features: Secondary | ICD-10-CM | POA: Diagnosis present

## 2020-09-18 DIAGNOSIS — F319 Bipolar disorder, unspecified: Secondary | ICD-10-CM | POA: Diagnosis present

## 2020-09-18 DIAGNOSIS — Z20822 Contact with and (suspected) exposure to covid-19: Secondary | ICD-10-CM | POA: Diagnosis not present

## 2020-09-18 DIAGNOSIS — Y908 Blood alcohol level of 240 mg/100 ml or more: Secondary | ICD-10-CM | POA: Insufficient documentation

## 2020-09-18 DIAGNOSIS — F1024 Alcohol dependence with alcohol-induced mood disorder: Secondary | ICD-10-CM | POA: Insufficient documentation

## 2020-09-18 DIAGNOSIS — Z79899 Other long term (current) drug therapy: Secondary | ICD-10-CM | POA: Diagnosis not present

## 2020-09-18 DIAGNOSIS — F1014 Alcohol abuse with alcohol-induced mood disorder: Secondary | ICD-10-CM | POA: Diagnosis present

## 2020-09-18 DIAGNOSIS — Z765 Malingerer [conscious simulation]: Secondary | ICD-10-CM | POA: Diagnosis not present

## 2020-09-18 DIAGNOSIS — Z5901 Sheltered homelessness: Secondary | ICD-10-CM | POA: Insufficient documentation

## 2020-09-18 DIAGNOSIS — R45851 Suicidal ideations: Secondary | ICD-10-CM | POA: Insufficient documentation

## 2020-09-18 DIAGNOSIS — F1020 Alcohol dependence, uncomplicated: Secondary | ICD-10-CM | POA: Diagnosis present

## 2020-09-18 DIAGNOSIS — F329 Major depressive disorder, single episode, unspecified: Secondary | ICD-10-CM | POA: Diagnosis not present

## 2020-09-18 DIAGNOSIS — F1424 Cocaine dependence with cocaine-induced mood disorder: Secondary | ICD-10-CM | POA: Diagnosis present

## 2020-09-18 DIAGNOSIS — J09X2 Influenza due to identified novel influenza A virus with other respiratory manifestations: Secondary | ICD-10-CM | POA: Insufficient documentation

## 2020-09-18 DIAGNOSIS — J101 Influenza due to other identified influenza virus with other respiratory manifestations: Secondary | ICD-10-CM

## 2020-09-18 DIAGNOSIS — Z789 Other specified health status: Secondary | ICD-10-CM

## 2020-09-18 LAB — COMPREHENSIVE METABOLIC PANEL
ALT: 14 U/L (ref 0–44)
AST: 19 U/L (ref 15–41)
Albumin: 4 g/dL (ref 3.5–5.0)
Alkaline Phosphatase: 82 U/L (ref 38–126)
Anion gap: 12 (ref 5–15)
BUN: 8 mg/dL (ref 6–20)
CO2: 25 mmol/L (ref 22–32)
Calcium: 8.5 mg/dL — ABNORMAL LOW (ref 8.9–10.3)
Chloride: 101 mmol/L (ref 98–111)
Creatinine, Ser: 0.9 mg/dL (ref 0.61–1.24)
GFR, Estimated: >60 mL/min (ref >60)
Glucose, Bld: 106 mg/dL — ABNORMAL HIGH (ref 70–99)
Potassium: 4.2 mmol/L (ref 3.5–5.1)
Sodium: 138 mmol/L (ref 135–145)
Total Bilirubin: 0.5 mg/dL (ref 0.3–1.2)
Total Protein: 7.7 g/dL (ref 6.5–8.1)

## 2020-09-18 LAB — CBC
HCT: 44.3 % (ref 39.0–52.0)
Hemoglobin: 13.9 g/dL (ref 13.0–17.0)
MCH: 26.9 pg (ref 26.0–34.0)
MCHC: 31.4 g/dL (ref 30.0–36.0)
MCV: 85.9 fL (ref 80.0–100.0)
Platelets: 243 10*3/uL (ref 150–400)
RBC: 5.16 MIL/uL (ref 4.22–5.81)
RDW: 15.3 % (ref 11.5–15.5)
WBC: 6.1 10*3/uL (ref 4.0–10.5)
nRBC: 0 % (ref 0.0–0.2)

## 2020-09-18 MED ORDER — HALOPERIDOL LACTATE 5 MG/ML IJ SOLN
INTRAMUSCULAR | Status: AC
  Administered 2020-09-18: 5 mg via INTRAVENOUS
  Filled 2020-09-18: qty 1

## 2020-09-18 MED ORDER — ONDANSETRON HCL 4 MG/2ML IJ SOLN
4.0000 mg | Freq: Once | INTRAMUSCULAR | Status: AC
  Administered 2020-09-18: 4 mg via INTRAVENOUS
  Filled 2020-09-18: qty 2

## 2020-09-18 MED ORDER — SODIUM CHLORIDE 0.9 % IV BOLUS
1000.0000 mL | Freq: Once | INTRAVENOUS | Status: AC
  Administered 2020-09-18: 1000 mL via INTRAVENOUS

## 2020-09-18 MED ORDER — HALOPERIDOL LACTATE 5 MG/ML IJ SOLN: 5.0000 mg | Freq: Once | INTRAMUSCULAR | Status: AC

## 2020-09-18 NOTE — ED Notes (Signed)
Pt repeatedly calling out to staff stating he doesn't drink because he wants to, pt states "I drink because my family doesn't treat me well and I want to die", pt tearful and not redirectable at this time

## 2020-09-18 NOTE — ED Triage Notes (Signed)
Pt BIB EMS from brothers house. Per brother, pt has consumed 1/2 fifth of vodka and 1-2 airplane bottles over the past 2 hours. Pt SI and was discharged from [facility] today and was due to report to [facility] tomorrow. Pt + for flu per [facility] discharge paperwork. Hx of ETOH abuse.

## 2020-09-18 NOTE — ED Provider Notes (Signed)
[facility]-EMERGENCY DEPT Provider Note   CSN: [id number] Arrival date & time: 09/18/20  2249     History Chief Complaint  Patient presents with  . Alcohol Intoxication  . Suicidal    [name] is a 50 y.o. male with a hx of alcohol abuse & prior withdrawal, bipolar 1 disorder, cocaine use, hypertension, PTSD, afib not on anticoagulation, & depression who presents to the ED for evaluation of excess alcohol consumption and SI. Per EMS to triage team patient was brought to the emergency department from his brother's house after he consumed a half of a fift of vodka as well as 1-2 air plane bottles of liquor over the past 2 hours. He was recently at [facility] medical center to which he presented with SI after what he described as unsatisfactory detox care at [facility], at [facility] facility he tested positive for influenza A, and was discharged today with plan for inpatient psych tomorrow per patient's brother's report. Patient states he drank a lot of alcohol, he has a bad headache, feels very sad/upset/frightened, and is unable to provide further history. Denies chest pain, dyspnea, or abdominal pain. Level 5 caveat secondary to intoxication.   HPI     Past Medical History:  Diagnosis Date  . Alcohol dependence with withdrawal (HCC)   . Anxiety   . Asthma   . Atrial fibrillation (HCC)    not on AC  . Bipolar 1 disorder (HCC)   . Class 2 obesity   . Cocaine abuse (HCC)   . Depression   . Heart palpitations    with severe anxiety  . Hypertension   . PTSD (post-traumatic stress disorder)   . PTSD (post-traumatic stress disorder)     Patient Active Problem List   Diagnosis Date Noted  . Alcohol withdrawal (HCC) 08/22/2020  . Bipolar 1 disorder (HCC) 06/20/2020  . MDD (major depressive disorder), recurrent, severe, with psychosis (HCC) 06/20/2020  . Cocaine abuse (HCC) 06/14/2020  . Class 2 obesity due to excess calories with body mass index  (BMI) of 39.0 to 39.9 in adult 06/14/2020  . Alcohol abuse with alcohol-induced mood disorder (HCC) 01/18/2017  . Cocaine dependence with cocaine-induced mood disorder (HCC)   . Hypertension 09/08/2011  . Post traumatic stress disorder (PTSD) 09/08/2011    Past Surgical History:  Procedure Laterality Date  . NO PAST SURGERIES         Family History  Problem Relation Age of Onset  . Alcohol abuse Mother   . Hypertension Father   . Dementia Father   . Alcohol abuse Maternal Grandfather     Social History   Tobacco Use  . Smoking status: Never Smoker  . Smokeless tobacco: Never Used  Vaping Use  . Vaping Use: Never used  Substance Use Topics  . Alcohol use: Yes    Comment: reports that his last drink was last Wednesday when he had 4- 24oz of liquor, said that 4 days out of the wk he drinks 4-24 ounce beers  . Drug use: Yes    Types: Cocaine    Comment: last use was "last Wednesday" and said that he uses a "minimal" amount, 1st time he used it since 2016    Home Medications Prior to Admission medications   Medication Sig Start Date End Date Taking? Authorizing Provider  amLODipine (NORVASC) 5 MG tablet Take 1 tablet (5 mg total) by mouth daily. 09/05/20   Benjiman Core, MD  chlordiazePOXIDE (LIBRIUM) 25 MG capsule  50mg  PO TID x 2D, then 25-50mg  PO BID X 2D, then 25-50mg  PO QD X 1D 09/05/20   11/03/20, MD  citalopram (CELEXA) 20 MG tablet Take 1 tablet (20 mg total) by mouth daily. 09/05/20   11/03/20, MD  cloNIDine (CATAPRES) 0.2 MG tablet Take 1 tablet (0.2 mg total) by mouth 3 (three) times daily as needed. 09/05/20   11/03/20, MD  doxepin (SINEQUAN) 10 MG capsule Take 1 capsule (10 mg total) by mouth at bedtime. 09/05/20   11/03/20, MD  hydrOXYzine (ATARAX/VISTARIL) 50 MG tablet Take 1 tablet (50 mg total) by mouth 3 (three) times daily as needed for anxiety. 09/05/20   11/03/20, MD  prazosin (MINIPRESS) 1 MG capsule Take 1 capsule (1  mg total) by mouth at bedtime. 09/05/20   11/03/20, MD  QUEtiapine (SEROQUEL) 300 MG tablet Take 1 tablet (300 mg total) by mouth at bedtime. 09/05/20   11/03/20, MD    Allergies    Patient has no known allergies.  Review of Systems   Review of Systems  Unable to perform ROS: Mental status change  Constitutional: Negative for fever.  Respiratory: Negative for shortness of breath.   Cardiovascular: Negative for chest pain.  Gastrointestinal: Negative for abdominal pain.  Neurological: Positive for headaches.  Psychiatric/Behavioral: Positive for suicidal ideas.    Physical Exam Updated Vital Signs BP (!) 137/109   Pulse 90   Temp 99.5 F (37.5 C) (Oral)   Resp 15   SpO2 100%   Physical Exam Vitals and nursing note reviewed.  Constitutional:      Appearance: He is not toxic-appearing.     Comments: Dried vomit present to front of patient's shirt.  HENT:     Head: Normocephalic and atraumatic.     Comments: No raccoon eyes or battle sign. Eyes:     Extraocular Movements: Extraocular movements intact.     Pupils: Pupils are equal, round, and reactive to light.  Cardiovascular:     Rate and Rhythm: Normal rate and regular rhythm.  Pulmonary:     Effort: Pulmonary effort is normal.     Breath sounds: No wheezing, rhonchi or rales.  Abdominal:     General: There is no distension.     Palpations: Abdomen is soft.     Tenderness: There is no abdominal tenderness. There is no guarding or rebound.  Musculoskeletal:     Cervical back: Normal range of motion and neck supple. No rigidity or tenderness.     Comments: Moving all extremities.  No focal bony tenderness.  No midline spinal tenderness.  Skin:    General: Skin is warm and dry.     Comments: Linear scars present to forearms.   Neurological:     Mental Status: He is alert.     Comments: Alert.  Slurred speech.  Delayed response to questions.  Moving all extremities.  Psychiatric:     Comments:  Intermittently tearful.     ED Results / Procedures / Treatments   Labs (all labs ordered are listed, but only abnormal results are displayed) Labs Reviewed - No data to display  EKG None  Radiology No results found.  Procedures Procedures (including critical care time)  Medications Ordered in ED Medications - No data to display  ED Course  I have reviewed the triage vital signs and the nursing notes.  Pertinent labs & imaging results that were available during my care of the patient were reviewed by me and  considered in my medical decision making (see chart for details).    MDM Rules/Calculators/A&P                         Patient presents to the ED with complaints of depression & alcohol problem.  Nontoxic, vitals w/ somewhat elevated BP on arrival. Tearful, slurred speech, hesitant to answer questions, clinically appears intoxicated.   Additional history obtained:  Additional history obtained from chart review, EMS to triage. Patient recently at [facility] over past few days, discharged 09/18/20 in the afternoon, found to be positive for flu A and was started on tamiflu, and was cleared for discharge with resources.   Lab Tests:  I Ordered, reviewed, and interpreted labs, which included:  CBC/CMP: Unremarkable.  Acetaminophen/salicylate levels: WNL Ethanol level: elevated @ 185 UDS: Positive for benzos Influenza A positive- known at prior facility.   Imaging Studies ordered:  I ordered imaging studies which included CT head given headache and poor historian, I independently visualized and interpreted imaging which showed no acute process, somewhat limited study, however suspect his headache could be more flu and or alcohol related.   ED Course:  23:42: Patient escalating, he is becoming verbally aggressive and threatening toward staff, disturbing other patients, attempted to redirect multiple times unsuccessfully. Will not cooperative with EKG, will proceed w/  Haldol at this time for agitation and have nursing staff place on cardiac monitor w/ EKG following this.   EKG: QTc 469  00:30: CIWA 4, on CIWA protocol.   Patient with some borderline SPO2 saturations on RA with sleeping, on chart review of hospitalist note in December 2021 it appears he has a hx of OSA and is on 2L of oxygen at night, but no CPAP, supplemental oxygen applied while sleeping, otherwise no desaturations noted.   03:30: RE-EVAL: Patient sleeping, resting comfortably.   05:40: RE-EVAL: Patient awake, communicating more clearly, continues to have headache, no focal deficits, apologized for behavior earlier tonight. PRN/home meds ordered. Medically cleared, home/prn meds ordered including tamiflu as he was already started on this by prior facility. He is pending TTS at this time, disposition per [facility].   The patient has been placed in psychiatric observation due to the need to provide a safe environment for the patient while obtaining psychiatric consultation and evaluation, as well as ongoing medical and medication management to treat the patient's condition.  The patient has not been placed under full IVC at this time.  Portions of this note were generated with Scientist, clinical (histocompatibility and immunogenetics). Dictation errors may occur despite best attempts at proofreading.  Final Clinical Impression(s) / ED Diagnoses Final diagnoses:  Suicidal ideation  Alcohol use  Influenza A    Rx / DC Orders ED Discharge Orders    None       Cherly Anderson, PA-C 09/19/20 [id number]    Nira Conn, MD 09/19/20 [phone]

## 2020-09-19 ENCOUNTER — Encounter (HOSPITAL_COMMUNITY): Payer: Self-pay | Admitting: Registered Nurse

## 2020-09-19 ENCOUNTER — Ambulatory Visit (INDEPENDENT_AMBULATORY_CARE_PROVIDER_SITE_OTHER)
Admission: EM | Admit: 2020-09-19 | Discharge: 2020-09-19 | Disposition: A | Discharge status: Home / Self Care | Payer: Medicare Other | Source: Home / Self Care

## 2020-09-19 ENCOUNTER — Other Ambulatory Visit: Payer: Self-pay

## 2020-09-19 ENCOUNTER — Ambulatory Visit (HOSPITAL_COMMUNITY)
Admission: AD | Admit: 2020-09-19 | Discharge: 2020-09-19 | Disposition: A | Discharge status: Home / Self Care | Payer: Medicare Other | Source: Home / Self Care | Attending: Psychiatry | Admitting: Psychiatry

## 2020-09-19 ENCOUNTER — Emergency Department (HOSPITAL_COMMUNITY): Payer: Medicare Other

## 2020-09-19 DIAGNOSIS — Z751 Person awaiting admission to adequate facility elsewhere: Secondary | ICD-10-CM | POA: Insufficient documentation

## 2020-09-19 DIAGNOSIS — F329 Major depressive disorder, single episode, unspecified: Secondary | ICD-10-CM | POA: Insufficient documentation

## 2020-09-19 DIAGNOSIS — F1020 Alcohol dependence, uncomplicated: Secondary | ICD-10-CM | POA: Insufficient documentation

## 2020-09-19 DIAGNOSIS — F1024 Alcohol dependence with alcohol-induced mood disorder: Secondary | ICD-10-CM

## 2020-09-19 DIAGNOSIS — F1014 Alcohol abuse with alcohol-induced mood disorder: Secondary | ICD-10-CM | POA: Diagnosis not present

## 2020-09-19 DIAGNOSIS — Z765 Malingerer [conscious simulation]: Secondary | ICD-10-CM | POA: Insufficient documentation

## 2020-09-19 DIAGNOSIS — F332 Major depressive disorder, recurrent severe without psychotic features: Secondary | ICD-10-CM | POA: Diagnosis present

## 2020-09-19 DIAGNOSIS — F1990 Other psychoactive substance use, unspecified, uncomplicated: Secondary | ICD-10-CM | POA: Insufficient documentation

## 2020-09-19 DIAGNOSIS — Z8659 Personal history of other mental and behavioral disorders: Secondary | ICD-10-CM | POA: Insufficient documentation

## 2020-09-19 DIAGNOSIS — F4310 Post-traumatic stress disorder, unspecified: Secondary | ICD-10-CM | POA: Insufficient documentation

## 2020-09-19 DIAGNOSIS — Z5901 Sheltered homelessness: Secondary | ICD-10-CM | POA: Insufficient documentation

## 2020-09-19 DIAGNOSIS — F319 Bipolar disorder, unspecified: Secondary | ICD-10-CM | POA: Diagnosis present

## 2020-09-19 LAB — RAPID URINE DRUG SCREEN, HOSP PERFORMED
Amphetamines: NOT DETECTED
Barbiturates: NOT DETECTED
Benzodiazepines: POSITIVE — AB
Cocaine: NOT DETECTED
Opiates: NOT DETECTED
Tetrahydrocannabinol: NOT DETECTED

## 2020-09-19 LAB — ETHANOL: Alcohol, Ethyl (B): 185 mg/dL — ABNORMAL HIGH (ref <10)

## 2020-09-19 LAB — RESP PANEL BY RT-PCR (FLU A&B, COVID) ARPGX2
Influenza A by PCR: POSITIVE — AB
Influenza B by PCR: NEGATIVE
SARS Coronavirus 2 by RT PCR: NEGATIVE

## 2020-09-19 LAB — SALICYLATE LEVEL: Salicylate Lvl: <7 mg/dL — ABNORMAL LOW (ref 7.0–30.0)

## 2020-09-19 LAB — ACETAMINOPHEN LEVEL: Acetaminophen (Tylenol), Serum: <10 ug/mL — ABNORMAL LOW (ref 10–30)

## 2020-09-19 MED ORDER — CITALOPRAM HYDROBROMIDE 10 MG PO TABS
20.0000 mg | ORAL_TABLET | Freq: Every day | ORAL | Status: DC
  Administered 2020-09-19: 20 mg via ORAL
  Filled 2020-09-19: qty 2

## 2020-09-19 MED ORDER — ALUM & MAG HYDROXIDE-SIMETH 200-200-20 MG/5ML PO SUSP: 30.0000 mL | Freq: Four times a day (QID) | ORAL | Status: DC | PRN

## 2020-09-19 MED ORDER — CLONIDINE HCL 0.1 MG PO TABS: 0.2000 mg | ORAL_TABLET | Freq: Three times a day (TID) | ORAL | Status: DC | PRN

## 2020-09-19 MED ORDER — HYDROXYZINE HCL 25 MG PO TABS: 50.0000 mg | ORAL_TABLET | Freq: Three times a day (TID) | ORAL | Status: DC | PRN

## 2020-09-19 MED ORDER — LORAZEPAM 2 MG/ML IJ SOLN: 0.0000 mg | Freq: Four times a day (QID) | INTRAMUSCULAR | Status: DC

## 2020-09-19 MED ORDER — ACETAMINOPHEN 325 MG PO TABS
650.0000 mg | ORAL_TABLET | ORAL | Status: DC | PRN
  Administered 2020-09-19: 650 mg via ORAL
  Filled 2020-09-19: qty 2

## 2020-09-19 MED ORDER — LORAZEPAM 2 MG/ML IJ SOLN: 0.0000 mg | Freq: Two times a day (BID) | INTRAMUSCULAR | Status: DC

## 2020-09-19 MED ORDER — ONDANSETRON HCL 4 MG PO TABS: 4.0000 mg | ORAL_TABLET | Freq: Three times a day (TID) | ORAL | Status: DC | PRN

## 2020-09-19 MED ORDER — THIAMINE HCL 100 MG/ML IJ SOLN: 100.0000 mg | Freq: Every day | INTRAMUSCULAR | Status: DC

## 2020-09-19 MED ORDER — PRAZOSIN HCL 1 MG PO CAPS: 1.0000 mg | ORAL_CAPSULE | Freq: Every day | ORAL | Status: DC

## 2020-09-19 MED ORDER — LORAZEPAM 1 MG PO TABS: 0.0000 mg | ORAL_TABLET | Freq: Two times a day (BID) | ORAL | Status: DC

## 2020-09-19 MED ORDER — LORAZEPAM 1 MG PO TABS
0.0000 mg | ORAL_TABLET | Freq: Four times a day (QID) | ORAL | Status: DC
  Administered 2020-09-19: 1 mg via ORAL
  Filled 2020-09-19: qty 1

## 2020-09-19 MED ORDER — DOXEPIN HCL 10 MG PO CAPS: 10.0000 mg | ORAL_CAPSULE | Freq: Every day | ORAL | Status: DC

## 2020-09-19 MED ORDER — OSELTAMIVIR PHOSPHATE 75 MG PO CAPS
75.0000 mg | ORAL_CAPSULE | Freq: Two times a day (BID) | ORAL | Status: DC
  Administered 2020-09-19: 75 mg via ORAL
  Filled 2020-09-19: qty 1

## 2020-09-19 MED ORDER — AMLODIPINE BESYLATE 5 MG PO TABS
5.0000 mg | ORAL_TABLET | Freq: Every day | ORAL | Status: DC
  Administered 2020-09-19: 5 mg via ORAL
  Filled 2020-09-19: qty 1

## 2020-09-19 MED ORDER — QUETIAPINE FUMARATE 300 MG PO TABS
300.0000 mg | ORAL_TABLET | Freq: Every day | ORAL | Status: DC
Start: 2020-09-19 — End: 2020-09-19

## 2020-09-19 MED ORDER — THIAMINE HCL 100 MG PO TABS
100.0000 mg | ORAL_TABLET | Freq: Every day | ORAL | Status: DC
  Administered 2020-09-19: 100 mg via ORAL
  Filled 2020-09-19: qty 1

## 2020-09-19 NOTE — ED Notes (Signed)
Pt sleeping peacefully at this time.

## 2020-09-19 NOTE — ED Notes (Signed)
Patients belongings in charge nursing station. Contains one white bag that has pants and shirt and a black bag. Patient has changed into appropriate scrubs.

## 2020-09-19 NOTE — Discharge Instructions (Addendum)
To help you maintain a sober lifestyle, a substance abuse treatment program may be beneficial to you.  Contact one of the following providers at your earliest opportunity to ask about enrolling in their program:  RESIDENTIAL PROGRAMS:       [facility]      [address]      [place], [place] [address]      [phone]  OUTPATIENT PROGRAMS:       [facility]      [address] [employer]      [place], [place] [address]      [phone]      Contact person: Fulton Reek, LCSW       [facility]      [address] [place], [place] [address]      [phone]

## 2020-09-19 NOTE — BH Assessment (Signed)
Assessment Note  [name] is a 51 y.o. male who presented to [name] after being discharged from [name] with complaint of alcohol dependency, despondency, and anxiety.  Pt was fully assessed this AM by author and was psych-cleared by S. Rankin, NP.  He was discharged with peer support.  Pt now presents to [name] seeking inpatient placement for detox.  Pt denies suicidal ideation, homicidal ideation, hallucination, self-injurious behavior.  Pt endorsed daily use of alcohol and past use of cocaine.  Earlier, Pt endorsed suicidal ideation.  He now endorses anxiety and tachycardia, and he requested medication for these symptoms.  During assessment, Pt presented as alert and oriented.  He had good eye contact and was cooperative.  Pt was dressed in street clothes, and he appeared disheveled and soiled.  Pt's mood was anxious.  Affect was blunted.  Pt's speech was normal in rate, rhythm, and volume.  Thought processes were within normal range, and thought content was logical and goal-oriented.  There was no evidence of delusion.  Memory and concentration were intact.  Insight, judgment, and impulse control were poor.  Pt has a history of presenting to EDs.  He was recently discharged from [name], and he also spent three days at [name].  Per Marcus Raider, NP, Pt is psych-cleared.  He was discharged and given a bus pass.  Diagnosis: Alcohol Dependency with alcohol-induced mood disturbance  Past Medical History:  Past Medical History:  Diagnosis Date  . Alcohol dependence with withdrawal (HCC)   . Anxiety   . Asthma   . Atrial fibrillation (HCC)    not on AC  . Bipolar 1 disorder (HCC)   . Class 2 obesity   . Cocaine abuse (HCC)   . Depression   . Heart palpitations    with severe anxiety  . Hypertension   . PTSD (post-traumatic stress disorder)   . PTSD (post-traumatic stress disorder)     Past Surgical History:  Procedure Laterality Date  . NO PAST SURGERIES      Family History:  Family History   Problem Relation Age of Onset  . Alcohol abuse Mother   . Hypertension Father   . Dementia Father   . Alcohol abuse Maternal Grandfather     Social History:  reports that he has never smoked. He has never used smokeless tobacco. He reports current alcohol use. He reports current drug use. Drug: Cocaine.  Additional Social History:     CIWA:   COWS:    Allergies:  Allergies  Allergen Reactions  . Metoprolol     Other reaction(s): Other Prostate problem    Home Medications: (Not in a [name] admission)   OB/GYN Status:  No LMP for male patient.  General Assessment Data Location of Assessment: [name] Date Telepsych consult ordered in [facility]: 09/19/20                                                            Disposition:     On Site Evaluation by:   Reviewed with Physician:    [name] 09/19/2020 1:32 PM

## 2020-09-19 NOTE — BH Assessment (Signed)
Clinician contacted [facility] in an attempt to complete pt's BH Assessment. Pt's nurse, Brayton Caves RN, shared pt is currently sedated and unable to participate in his assessment at this time. TTS to attempt assessment at a later time.

## 2020-09-19 NOTE — Consult Note (Signed)
[facility] Psych ED Discharge  09/19/2020 11:10 AM [name]  MRN:  [id number] Principal Problem: Alcohol abuse with alcohol-induced mood disorder [facility]) Discharge Diagnoses: Principal Problem:   Alcohol abuse with alcohol-induced mood disorder (HCC) Active Problems:   Post traumatic stress disorder (PTSD)   Alcohol use disorder, severe, dependence (HCC)   Major depressive disorder, recurrent, severe without psychotic features (HCC)   Cocaine dependence with cocaine-induced mood disorder (HCC)   Bipolar 1 disorder (HCC)   Subjective: "There was problems at [facility] so I decided to leave.  Now I regret it."  [name], 50 y.o., male patient admitted to [facility] ED after presenting with complaints of alcohol use disorder and requesting short-term or long-term rehab assistance. Patient seen via tele psych by this provider, consulted with Dr. Lucianne Muss; and chart reviewed on 09/19/20.  On evaluation [name] reports he was previously in [facility] recovery in [place] but left related to some issues that he did not like involving staff and now regrets that he left.  Patient is requesting assistance to get into a long-term or short-term rehab facility.  Patient reported he only said he was suicidal because he was told he had to say that if he wanted to get help. During evaluation [name] is sitting on side of bed in no acute distress.  He is alert, oriented x 4, calm and cooperative.  His mood is euthymic with congruent affect.  He does not appear to be responding to internal/external stimuli or delusional thoughts.  Patient denies suicidal/self-harm/homicidal ideation, psychosis, and paranoia.  Patient answered question appropriately.  Discussed ordering peers support consult to assist with rehab services.  Total Time spent with patient: 30 minutes  Past Psychiatric History: See above  Past Medical History:  Past Medical History:  Diagnosis Date  . Alcohol dependence with withdrawal (HCC)    . Anxiety   . Asthma   . Atrial fibrillation (HCC)    not on AC  . Bipolar 1 disorder (HCC)   . Class 2 obesity   . Cocaine abuse (HCC)   . Depression   . Heart palpitations    with severe anxiety  . Hypertension   . PTSD (post-traumatic stress disorder)   . PTSD (post-traumatic stress disorder)     Past Surgical History:  Procedure Laterality Date  . NO PAST SURGERIES     Family History:  Family History  Problem Relation Age of Onset  . Alcohol abuse Mother   . Hypertension Father   . Dementia Father   . Alcohol abuse Maternal Grandfather    Family Psychiatric  History: None reported Social History:  Social History   Substance and Sexual Activity  Alcohol Use Yes   Comment: reports that his last drink was last Wednesday when he had 4- 24oz of liquor, said that 4 days out of the wk he drinks 4-24 ounce beers     Social History   Substance and Sexual Activity  Drug Use Yes  . Types: Cocaine   Comment: last use was "last Wednesday" and said that he uses a "minimal" amount, 1st time he used it since 2016    Social History   Socioeconomic History  . Marital status: Single    Spouse name: Not on file  . Number of children: Not on file  . Years of education: Not on file  . Highest education level: Not on file  Occupational History  . Occupation: disabled  Tobacco Use  . Smoking status: Never  Smoker  . Smokeless tobacco: Never Used  Vaping Use  . Vaping Use: Never used  Substance and Sexual Activity  . Alcohol use: Yes    Comment: reports that his last drink was last Wednesday when he had 4- 24oz of liquor, said that 4 days out of the wk he drinks 4-24 ounce beers  . Drug use: Yes    Types: Cocaine    Comment: last use was "last Wednesday" and said that he uses a "minimal" amount, 1st time he used it since 2016  . Sexual activity: Not Currently    Comment: pt said he has an enlarged prostate  Other Topics Concern  . Not on file  Social History Narrative    ** Merged History Encounter **       Social Determinants of Health   Financial Resource Strain: Not on file  Food Insecurity: Not on file  Transportation Needs: Not on file  Physical Activity: Not on file  Stress: Not on file  Social Connections: Not on file    Has this patient used any form of tobacco in the last 30 days? (Cigarettes, Smokeless Tobacco, Cigars, and/or Pipes) Prescription not provided because: Patient does not use tobacco products  Current Medications: Current Facility-Administered Medications  Medication Dose Route Frequency Provider Last Rate Last Admin  . acetaminophen (TYLENOL) tablet 650 mg  650 mg Oral Q4H PRN Petrucelli, Samantha R, PA-C   650 mg at 09/19/20 0847  . alum & mag hydroxide-simeth (MAALOX/MYLANTA) 200-200-20 MG/5ML suspension 30 mL  30 mL Oral Q6H PRN Petrucelli, Samantha R, PA-C      . amLODipine (NORVASC) tablet 5 mg  5 mg Oral Daily Petrucelli, Samantha R, PA-C   5 mg at 09/19/20 0846  . citalopram (CELEXA) tablet 20 mg  20 mg Oral Daily Petrucelli, Samantha R, PA-C   20 mg at 09/19/20 0846  . cloNIDine (CATAPRES) tablet 0.2 mg  0.2 mg Oral TID PRN Petrucelli, Samantha R, PA-C      . doxepin (SINEQUAN) capsule 10 mg  10 mg Oral QHS Petrucelli, Samantha R, PA-C      . hydrOXYzine (ATARAX/VISTARIL) tablet 50 mg  50 mg Oral TID PRN Petrucelli, Samantha R, PA-C      . LORazepam (ATIVAN) injection 0-4 mg  0-4 mg Intravenous Q6H Petrucelli, Samantha R, PA-C       Or  . LORazepam (ATIVAN) tablet 0-4 mg  0-4 mg Oral Q6H Petrucelli, Samantha R, PA-C   1 mg at 09/19/20 0546  . [START ON 09/21/2020] LORazepam (ATIVAN) injection 0-4 mg  0-4 mg Intravenous Q12H Petrucelli, Samantha R, PA-C       Or  . [START ON 09/21/2020] LORazepam (ATIVAN) tablet 0-4 mg  0-4 mg Oral Q12H Petrucelli, Samantha R, PA-C      . ondansetron (ZOFRAN) tablet 4 mg  4 mg Oral Q8H PRN Petrucelli, Samantha R, PA-C      . oseltamivir (TAMIFLU) capsule 75 mg  75 mg Oral BID Petrucelli,  Samantha R, PA-C   75 mg at 09/19/20 0845  . prazosin (MINIPRESS) capsule 1 mg  1 mg Oral QHS Petrucelli, Samantha R, PA-C      . QUEtiapine (SEROQUEL) tablet 300 mg  300 mg Oral QHS Petrucelli, Samantha R, PA-C      . thiamine tablet 100 mg  100 mg Oral Daily Petrucelli, Samantha R, PA-C   100 mg at 09/19/20 0846   Or  . thiamine (B-1) injection 100 mg  100 mg Intravenous Daily  Petrucelli, Pleas Koch, PA-C       Current Outpatient Medications  Medication Sig Dispense Refill  . amLODipine (NORVASC) 5 MG tablet Take 1 tablet (5 mg total) by mouth daily. 30 tablet 0  . chlordiazePOXIDE (LIBRIUM) 25 MG capsule 50mg  PO TID x 2D, then 25-50mg  PO BID X 2D, then 25-50mg  PO QD X 1D 20 capsule 0  . citalopram (CELEXA) 20 MG tablet Take 1 tablet (20 mg total) by mouth daily. 30 tablet 0  . cloNIDine (CATAPRES) 0.2 MG tablet Take 1 tablet (0.2 mg total) by mouth 3 (three) times daily as needed. 12 tablet 0  . doxepin (SINEQUAN) 10 MG capsule Take 1 capsule (10 mg total) by mouth at bedtime. 30 capsule 0  . hydrOXYzine (ATARAX/VISTARIL) 50 MG tablet Take 1 tablet (50 mg total) by mouth 3 (three) times daily as needed for anxiety. 30 tablet 0  . prazosin (MINIPRESS) 1 MG capsule Take 1 capsule (1 mg total) by mouth at bedtime. 30 capsule 0  . QUEtiapine (SEROQUEL) 300 MG tablet Take 1 tablet (300 mg total) by mouth at bedtime. 30 tablet 0   PTA Medications: (Not in a hospital admission)   Musculoskeletal: Strength & Muscle Tone: within normal limits Gait & Station: normal Patient leans: N/A  Psychiatric Specialty Exam: Physical Exam Vitals and nursing note reviewed. Exam conducted with a chaperone present.  Constitutional:      General: He is not in acute distress.    Appearance: Normal appearance. He is not ill-appearing.  HENT:     Head: Normocephalic.  Cardiovascular:     Rate and Rhythm: Normal rate.  Pulmonary:     Effort: Pulmonary effort is normal.  Musculoskeletal:        General:  Normal range of motion.     Cervical back: Normal range of motion.  Neurological:     Mental Status: He is alert and oriented to person, place, and time.  Psychiatric:        Attention and Perception: Attention and perception normal. He does not perceive auditory or visual hallucinations.        Mood and Affect: Mood and affect normal.        Speech: Speech normal.        Behavior: Behavior normal. Behavior is cooperative.        Thought Content: Thought content normal. Thought content is not paranoid or delusional. Thought content does not include homicidal or suicidal ideation.        Cognition and Memory: Cognition and memory normal.        Judgment: Judgment normal.     Review of Systems  Constitutional: Negative.   HENT: Negative.   Eyes: Negative.   Respiratory: Negative.   Cardiovascular: Negative.   Gastrointestinal: Negative.   Genitourinary: Negative.   Musculoskeletal: Negative.   Skin: Negative.   Neurological: Negative.   Hematological: Negative.   Psychiatric/Behavioral: Negative for agitation, behavioral problems, confusion, hallucinations, self-injury and sleep disturbance. Suicidal ideas: Denies. The patient is not nervous/anxious.     Blood pressure 127/71, pulse 96, temperature 98.9 F (37.2 C), temperature source Oral, resp. rate 16, SpO2 97 %.There is no height or weight on file to calculate BMI.  General Appearance: Casual  Eye Contact:  Good  Speech:  Clear and Coherent and Normal Rate  Volume:  Normal  Mood:  Euthymic  Affect:  Appropriate and Congruent  Thought Process:  Coherent, Goal Directed and Descriptions of Associations: Intact  Orientation:  Full (Time,  Place, and Person)  Thought Content:  WDL  Suicidal Thoughts:  No  Homicidal Thoughts:  No  Memory:  Immediate;   Good Recent;   Good  Judgement:  Intact  Insight:  Present  Psychomotor Activity:  Normal  Concentration:  Concentration: Good and Attention Span: Good  Recall:  Good  Fund  of Knowledge:  Good  Language:  Good  Akathisia:  No  Handed:  Right  AIMS (if indicated):     Assets:  Communication Skills Desire for Improvement Social Support  ADL's:  Intact  Cognition:  WNL  Sleep:        Demographic Factors:  Male and Unemployed  Loss Factors: NA  Historical Factors: Impulsivity  Risk Reduction Factors:   Religious beliefs about death and Positive social support  Continued Clinical Symptoms:  Alcohol/Substance Abuse/Dependencies Previous Psychiatric Diagnoses and Treatments  Cognitive Features That Contribute To Risk:  None    Suicide Risk:  Minimal: No identifiable suicidal ideation.  Patients presenting with no risk factors but with morbid ruminations; may be classified as minimal risk based on the severity of the depressive symptoms  Plan Of Care/Follow-up recommendations:  Activity:  As tolerated Diet:  Heart healthy   Peers support ordered to assist with community services for substance use disorder, and rehab services.  Disposition: Patient psychiatrically cleared No evidence of imminent risk to self or others at present.   Patient does not meet criteria for psychiatric inpatient admission. Supportive therapy provided about ongoing stressors. Refer to IOP. Discussed crisis plan, support from social network, calling 911, coming to the Emergency Department, and calling Suicide Hotline.   Chanz Cahall, NP 09/19/2020, 11:10 AM

## 2020-09-19 NOTE — ED Triage Notes (Signed)
Patient arrived via EMS. Patient complains of Suicidal ideation. Patient has history of self cutting and there are numerous abrasions on arms. Patient states he currently takes Seroquel, but believes his medication needs to be adjusted.

## 2020-09-19 NOTE — BH Assessment (Signed)
1/19 @ 8:02  TTS attempted to see pt . Per Nurse pt sleeping hard / snoring
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
## 2020-09-19 NOTE — ED Notes (Signed)
Patient given AVS & walked to lobby by [facility] NP.

## 2020-09-19 NOTE — ED Provider Notes (Signed)
Emergency Medicine Observation Re-evaluation Note  [name] is a 50 y.o. male, seen on rounds today.  Pt initially presented to the ED for complaints of Alcohol Intoxication (BAC was 185 on admission) and Suicidal (Pt endorsed general SI, reported he has attempted suicide 33x in the past) Currently, the patient is cleared by psychiatry for discharge and outpatient treatment.  Physical Exam  BP 127/71 (BP Location: Right Arm)   Pulse 96   Temp 98.9 F (37.2 C) (Oral)   Resp 16   SpO2 97%  Physical Exam General: Patient sitting up on the edge of the stretcher in the hallway.  He is resting quietly.  No signs of agitation.  No respiratory distress. Psych: Patient is alert.  Speech is clear.  Patient is not agitated in appearance.  Situationally appropriate.  ED Course / MDM  EKG:    I have reviewed the labs performed to date as well as medications administered while in observation.  Recent changes in the last 24 hours include cleared by psychiatry.  Plan  Current plan is for discharge.. Patient is not under full IVC at this time. Patient has history of heavy alcohol use.  At this time he is alert and sitting quietly at the edge of stretcher.  No evidence of tremor, agitation, hypertension, confusion to suggest acute alcohol withdrawal.  Behavioral health has included resources in patient's discharge instruction for outpatient treatment for substance use.   Arby Barrette, MD 09/19/20 1147

## 2020-09-19 NOTE — BH Assessment (Signed)
[facility] Assessment Progress Note  Per Shuvon Rankin, NP, this voluntary pt does not require psychiatric hospitalization at this time.  Pt is psychiatrically cleared.  Discharge instructions include referral information for several area substance abuse treatment providers.  Pt would also benefit from seeing Peer Support Specialists, and a peer support consult has been ordered for pt.  EDP Arby Barrette, MD and pt's nurses, Dawn and [place], have been notified.  Doylene Canning, MA Triage Specialist [phone]

## 2020-09-19 NOTE — ED Provider Notes (Signed)
Behavioral Health Urgent Care Medical Screening Exam  Patient Name: [name] MRN: [id number] Date of Evaluation: 09/19/20 Chief Complaint: Chief Complaint/Presenting Problem: Pt presented to the ED from brothers' home (kicked out), stated he feels depressed and suicidal and that he also wants treatment for alcohol use Diagnosis:  Final diagnoses:  None    History of Present illness: [name] is a 50 y.o. male with history of polysubstance use, MDD, PTSD, and malingering.  Patient has been to 3 different hospitals today.  Patient seen by this provider earlier this morning while at [facility] ED.  During [facility] ED visit consult for peer support was ordered and referrals and resources given to patient.  Patient did telephone interview with [facility] rehab facility.  Patient discharged and peer support would continue to work on rehab placement.  Patient left [facility] and went across the street to [facility] with same complaint patient was psychiatrically cleared and discharged.  Patient left [facility] called EMS and was brought to [facility] with the similar complaint.   Patient is now presenting to the [facility] as a walk in via EMS reporting he was having chest pains from walking and, alcohol use disorder, requesting detox, and suicidal ideation. On first appearance patient pretending to be intoxicated until he saw that this is the same provider he straighten up.    Aggie Hacker, 50 y.o., male patient seen face to face by this provider, consulted with Dr. Lucianne Muss and chart reviewed on 09/19/20.  On evaluation [name] stating that he is not really suicidal just want some leniency.  Patient informed that there are several referrals in place for rehab facilities but going to ED to ED is not the way to get in faster.  Patient encouraged to stay at his brother's house where he can be located.  Patient reporting, he doesn't want to stay at his brothers or fathers house because they try to tell him what to do and  want to treat him like a child.  Patient informed that he has been seen 3 times today and has been psychiatrically cleared each time.  Patient also informed that giving false information about being suicidal and then changing it doesn't work.  Patient states that he is sorry and that he just wants a place to stay until can get into a halfway house or rehab facility.  Patient informed that he has been to [facility] in [place] and then [place] and the last being [place] where he left before finishing the program and probably couldn't go back. During evaluation [name] is sitting up in chair in no acute distress.  He is alert, oriented x 4, calm, cooperative and attentive.  His mood is appropriate and congruent with affect.  He has normal speech, and behavior.  Objectively there is no evidence of psychosis/mania or delusional thinking.  Patient is able to converse coherently, goal directed thoughts, no distractibility, or pre-occupation.  He also denies suicidal/self-harm/homicidal ideation, psychosis, and paranoia.  Patient answered question appropriately.  Patient encouraged to continue working with peer support and to follow up with resources and referrals made.      Psychiatric Specialty Exam  Presentation  General Appearance:Appropriate for Environment; Casual; Disheveled  Eye Contact:Good  Speech:Clear and Coherent; Normal Rate  Speech Volume:Normal  Handedness:Right   Mood and Affect  Mood:Depressed  Affect:Appropriate; Congruent   Thought Process  Thought Processes:Coherent  Descriptions of Associations:Intact  Orientation:Full (Time, Place and Person)  Thought Content:WDL  Hallucinations:None reports hearing a constant voice reports that he sees a circular object when he is nearing sleep that startles him and that he is not able to speak or move briefly  Ideas of Reference:None  Suicidal Thoughts:No With Intent; With Plan; With Means to Carry Out  Homicidal  Thoughts:No   Sensorium  Memory:Immediate Good; Recent Good  Judgment:Intact  Insight:Present   Executive Functions  Concentration:Fair  Attention Span:Good  Recall:Good  Fund of Knowledge:Good  Language:Good   Psychomotor Activity  Psychomotor Activity:Normal   Assets  Assets:Communication Skills; Desire for Improvement; Social Support   Sleep  Sleep:Good  Number of hours: 6   Physical Exam: Physical Exam Vitals and nursing note reviewed. Exam conducted with a chaperone present.  Constitutional:      General: He is not in acute distress.    Appearance: Normal appearance. He is not ill-appearing.  HENT:     Head: Normocephalic.  Eyes:     Pupils: Pupils are equal, round, and reactive to light.  Cardiovascular:     Rate and Rhythm: Normal rate.  Pulmonary:     Effort: Pulmonary effort is normal.  Musculoskeletal:        General: Normal range of motion.     Cervical back: Normal range of motion.  Skin:    General: Skin is warm and dry.     Comments: Patient has multiple old scared laceration bilateral upper ext.    Neurological:     Mental Status: He is alert and oriented to person, place, and time.  Psychiatric:        Attention and Perception: Attention and perception normal. He does not perceive auditory or visual hallucinations.        Mood and Affect: Mood and affect normal.        Speech: Speech normal.        Behavior: Behavior normal. Behavior is cooperative.        Thought Content: Thought content normal. Thought content is not paranoid or delusional. Thought content does not include homicidal or suicidal ideation.        Cognition and Memory: Cognition and memory normal.        Judgment: Judgment is impulsive.    Review of Systems  Constitutional: Negative.   HENT: Negative.   Eyes: Negative.   Respiratory: Negative.   Cardiovascular: Negative.   Gastrointestinal: Negative.   Genitourinary: Negative.   Musculoskeletal: Negative.    Skin: Negative.   Neurological: Negative.   Endo/Heme/Allergies: Negative.   Psychiatric/Behavioral: Positive for substance abuse. Negative for hallucinations. Depression: Stable. Suicidal ideas: Denies. The patient does not have insomnia. Nervous/anxious: Stable.        Patient initial presentation is that he is suicidal and then he apologies stating that he is not suicidal just want leniency and place to stay until he can get into a rehab or half-way house.     Blood pressure (!) 134/95, pulse 95, temperature 99 F (37.2 C), temperature source Oral, resp. rate 18, SpO2 97 %. There is no height or weight on file to calculate BMI.  Musculoskeletal: Strength & Muscle Tone: within normal limits Gait & Station: normal Patient leans: N/A   [facility] MSE Discharge Disposition for Follow up and Recommendations: Based on my evaluation the patient does not appear to have an emergency medical condition and can be discharged with resources and follow up care in outpatient services for Medication Management, Partial Hospitalization Program, Substance Abuse Intensive Outpatient Program, Individual Therapy and Group Therapy  Follow-up Information    Go to  [facility].   Specialty: Urgent Care Why: Open Access:  Monday - Thursday from 8 am to 11 am for medication management and therapy intake.  On Friday from 1 pm to 4 pm for therapy intake only Contact information: [address] [place] [address] [phone]              Assunta Found, NP 09/19/2020, 6:56 PM

## 2020-09-19 NOTE — H&P (Signed)
Behavioral Health Medical Screening Exam  [name] is an 50 y.o. male with history of polysubstance use, MDD, PTSD, and malingering. He was seen by TTS this morning who recommended discharge, and patient was provided with outpatient referrals. He was discharged from [facility] and immediately came across the street to [facility], again requesting inpatient treatment. He admits that he is not really suicidal but had lied to try to be admitted this morning. He is requesting admission for detox from alcohol. Patient was advised we do not provide detox services here and provided with referrals for detox and rehab. Patient continues to request to be admitted here for anxiety and informed that he does not meet inpatient admission due to no SI, HI, or AVH and again encouraged to follow up with provided substance use and mental health referrals.  Total Time spent with patient: 15 minutes  Psychiatric Specialty Exam: Physical Exam Vitals and nursing note reviewed.  Constitutional:      Appearance: He is well-developed and well-nourished.  Cardiovascular:     Rate and Rhythm: Normal rate.  Pulmonary:     Effort: Pulmonary effort is normal.  Neurological:     Mental Status: He is alert and oriented to person, place, and time.    Review of Systems  Constitutional: Negative.   Respiratory: Negative for cough and shortness of breath.   Psychiatric/Behavioral: Positive for dysphoric mood. Negative for agitation, behavioral problems, confusion, decreased concentration, hallucinations, self-injury, sleep disturbance and suicidal ideas. The patient is nervous/anxious. The patient is not hyperactive.    There were no vitals taken for this visit.There is no height or weight on file to calculate BMI. General Appearance: Casual Eye Contact:  Good Speech:  Normal Rate Volume:  Normal Mood:  Anxious Affect:  Congruent Thought Process:  Coherent Orientation:  Full (Time, Place, and Person) Thought Content:   Logical Suicidal Thoughts:  No Homicidal Thoughts:  No Memory:  Immediate;   Fair Recent;   Fair Remote;   Fair Judgement:  Intact Insight:  Fair Psychomotor Activity:  Normal Concentration: Concentration: Fair and Attention Span: Fair Recall:  [employer] of Knowledge:Fair Language: Fair Akathisia:  No Handed:  Right AIMS (if indicated):    Assets:  Architect Leisure Time Resilience Sleep:     Musculoskeletal: Strength & Muscle Tone: within normal limits Gait & Station: normal Patient leans: N/A  There were no vitals taken for this visit.  Recommendations: Based on my evaluation the patient does not appear to have an emergency medical condition.  Patient is malingering. Patient provided with substance use detox and rehab referrals as well as outpatient mental health referrals.  Aldean Baker, NP 09/19/2020, 1:22 PM

## 2020-09-19 NOTE — ED Notes (Signed)
TTS consult taking place now.

## 2020-09-19 NOTE — Discharge Instructions (Signed)
Pt was offered peer support when discharged from [facility], and he was provided resources on substance use when discharged from [facility].

## 2020-09-20 ENCOUNTER — Encounter (HOSPITAL_COMMUNITY): Payer: Self-pay

## 2020-09-20 ENCOUNTER — Emergency Department (HOSPITAL_COMMUNITY)
Admission: EM | Admit: 2020-09-20 | Discharge: 2020-09-20 | Disposition: A | Discharge status: Home / Self Care | Payer: Medicare Other | Attending: Emergency Medicine | Admitting: Emergency Medicine

## 2020-09-20 ENCOUNTER — Other Ambulatory Visit: Payer: Self-pay

## 2020-09-20 DIAGNOSIS — R519 Headache, unspecified: Secondary | ICD-10-CM | POA: Diagnosis not present

## 2020-09-20 DIAGNOSIS — F10239 Alcohol dependence with withdrawal, unspecified: Secondary | ICD-10-CM | POA: Insufficient documentation

## 2020-09-20 DIAGNOSIS — F1010 Alcohol abuse, uncomplicated: Secondary | ICD-10-CM

## 2020-09-20 DIAGNOSIS — Z5321 Procedure and treatment not carried out due to patient leaving prior to being seen by health care provider: Secondary | ICD-10-CM | POA: Diagnosis not present

## 2020-09-20 MED ORDER — ONDANSETRON 4 MG PO TBDP: 4.0000 mg | ORAL_TABLET | Freq: Once | ORAL | Status: DC

## 2020-09-20 MED ORDER — LORAZEPAM 1 MG PO TABS: 1.0000 mg | ORAL_TABLET | Freq: Once | ORAL | Status: DC

## 2020-09-20 NOTE — ED Triage Notes (Signed)
Pt BIB EMS from home. Pt reports he has been withdrawing from alcohol x2 days. Pt reports headache and N/V. A&O x4.

## 2020-09-21 ENCOUNTER — Other Ambulatory Visit (HOSPITAL_COMMUNITY): Payer: Self-pay | Admitting: Medical

## 2020-09-21 ENCOUNTER — Encounter (HOSPITAL_COMMUNITY): Payer: Self-pay

## 2020-09-21 ENCOUNTER — Other Ambulatory Visit: Payer: Self-pay

## 2020-09-21 ENCOUNTER — Emergency Department (HOSPITAL_COMMUNITY)
Admission: EM | Admit: 2020-09-21 | Discharge: 2020-09-21 | Disposition: A | Discharge status: Home / Self Care | Payer: Medicare Other | Attending: Emergency Medicine | Admitting: Emergency Medicine

## 2020-09-21 ENCOUNTER — Emergency Department (HOSPITAL_COMMUNITY): Payer: Medicare Other

## 2020-09-21 DIAGNOSIS — J45909 Unspecified asthma, uncomplicated: Secondary | ICD-10-CM | POA: Insufficient documentation

## 2020-09-21 DIAGNOSIS — Z79899 Other long term (current) drug therapy: Secondary | ICD-10-CM | POA: Insufficient documentation

## 2020-09-21 DIAGNOSIS — F10239 Alcohol dependence with withdrawal, unspecified: Secondary | ICD-10-CM | POA: Diagnosis not present

## 2020-09-21 DIAGNOSIS — F10930 Alcohol use, unspecified with withdrawal, uncomplicated: Secondary | ICD-10-CM

## 2020-09-21 DIAGNOSIS — I10 Essential (primary) hypertension: Secondary | ICD-10-CM | POA: Diagnosis not present

## 2020-09-21 MED ORDER — LORAZEPAM 1 MG PO TABS: 0.0000 mg | ORAL_TABLET | Freq: Two times a day (BID) | ORAL | Status: DC

## 2020-09-21 MED ORDER — LORAZEPAM 2 MG/ML IJ SOLN: 0.0000 mg | Freq: Two times a day (BID) | INTRAMUSCULAR | Status: DC

## 2020-09-21 MED ORDER — THIAMINE HCL 100 MG/ML IJ SOLN
100.0000 mg | Freq: Every day | INTRAMUSCULAR | Status: DC
Start: 2020-09-21 — End: 2020-09-21

## 2020-09-21 MED ORDER — CHLORDIAZEPOXIDE HCL 25 MG PO CAPS: ORAL_CAPSULE | ORAL | 0 refills | Status: DC

## 2020-09-21 MED ORDER — THIAMINE HCL 100 MG PO TABS
100.0000 mg | ORAL_TABLET | Freq: Every day | ORAL | Status: DC
Start: 2020-09-21 — End: 2020-09-21
  Filled 2020-09-21: qty 1

## 2020-09-21 MED ORDER — LORAZEPAM 1 MG PO TABS
0.0000 mg | ORAL_TABLET | Freq: Four times a day (QID) | ORAL | Status: DC
  Filled 2020-09-21: qty 2

## 2020-09-21 MED ORDER — CHLORDIAZEPOXIDE HCL 25 MG PO CAPS
75.0000 mg | ORAL_CAPSULE | Freq: Once | ORAL | Status: AC
  Administered 2020-09-21: 75 mg via ORAL
  Filled 2020-09-21: qty 3

## 2020-09-21 MED ORDER — LORAZEPAM 2 MG/ML IJ SOLN: 0.0000 mg | Freq: Four times a day (QID) | INTRAMUSCULAR | Status: DC

## 2020-09-21 MED FILL — CHLORDIAZEPOXIDE 25 MG CAPS: 25 | 3 days supply | Qty: 10 | Fill #0

## 2020-09-21 NOTE — ED Notes (Signed)
Patient put the call bell on. When writer went to the room, the patient stated, "I just talked to my father and he yelled at me. Now I feeling trauma."

## 2020-09-21 NOTE — ED Provider Notes (Signed)
[facility]-EMERGENCY DEPT Provider Note   CSN: [id number] Arrival date & time: 09/21/20  1402     History Chief Complaint  Patient presents with  . Hypertension    [name] is a 50 y.o. male history of alcohol dependence with withdrawal, bipolar, obesity, polysubstance abuse, hypertension.  Patient arrives today requesting medication to help with alcohol withdrawal.  He reports that he is trying to get back to [facility] to go to his rehab facility.  He reports that he was recently discharged from rehab but unfortunately relapsed this past Monday when he was spending time at his brother's house.  He reports that he drank 1.5 L of liquor that day which is his normal amount.  He then tried to quit drinking altogether again, yesterday, Thursday he took a few shots to help with alcohol withdrawal.  Patient does not want to drink anymore alcohol and is here for medication to help with his withdrawal as he would like to become sober.  Patient denies any recent illness, denies fall/injury, fever/chills, headache, neck pain, chest pain, abdominal pain, nausea/vomiting, shortness of breath, suicidal ideations, homicidal ideations, intentional overdose, auditory hallucinations or any additional concerns.  Patient denies visual hallucinations on my exam but does report that the lights seem brighter than normal today  HPI     Past Medical History:  Diagnosis Date  . Alcohol dependence with withdrawal (HCC)   . Anxiety   . Asthma   . Atrial fibrillation (HCC)    not on AC  . Bipolar 1 disorder (HCC)   . Class 2 obesity   . Cocaine abuse (HCC)   . Depression   . Heart palpitations    with severe anxiety  . Hypertension   . PTSD (post-traumatic stress disorder)   . PTSD (post-traumatic stress disorder)     Patient Active Problem List   Diagnosis Date Noted  . Malingering 09/19/2020  . Alcohol dependence with alcohol-induced mood disorder (HCC)    . Alcohol withdrawal (HCC) 08/22/2020  . Bipolar 1 disorder (HCC) 06/20/2020  . MDD (major depressive disorder), recurrent, severe, with psychosis (HCC) 06/20/2020  . Cocaine abuse (HCC) 06/14/2020  . Class 2 obesity due to excess calories with body mass index (BMI) of 39.0 to 39.9 in adult 06/14/2020  . Alcohol abuse with alcohol-induced mood disorder (HCC) 01/18/2017  . Major depressive disorder, recurrent, severe without psychotic features (HCC)   . Cocaine dependence with cocaine-induced mood disorder (HCC)   . Alcohol use disorder, severe, dependence (HCC) 04/12/2015  . Hypertension 09/08/2011  . Post traumatic stress disorder (PTSD) 09/08/2011    Past Surgical History:  Procedure Laterality Date  . NO PAST SURGERIES         Family History  Problem Relation Age of Onset  . Alcohol abuse Mother   . Hypertension Father   . Dementia Father   . Alcohol abuse Maternal Grandfather     Social History   Tobacco Use  . Smoking status: Never Smoker  . Smokeless tobacco: Never Used  Vaping Use  . Vaping Use: Never used  Substance Use Topics  . Alcohol use: Yes  . Drug use: Yes    Types: Cocaine    Home Medications Prior to Admission medications   Medication Sig Start Date End Date Taking? Authorizing Provider  amLODipine (NORVASC) 5 MG tablet Take 1 tablet (5 mg total) by mouth daily. 09/05/20   Benjiman Core, MD  chlordiazePOXIDE (LIBRIUM) 25 MG capsule 50mg  PO TID  x 1D, then 25-50mg  PO BID X 1D, then 25-50mg  PO QD X 1D 09/21/20   Harlene Salts A, PA-C  citalopram (CELEXA) 20 MG tablet Take 1 tablet (20 mg total) by mouth daily. 09/05/20   Benjiman Core, MD  cloNIDine (CATAPRES) 0.2 MG tablet Take 1 tablet (0.2 mg total) by mouth 3 (three) times daily as needed. 09/05/20   Benjiman Core, MD  doxepin (SINEQUAN) 10 MG capsule Take 1 capsule (10 mg total) by mouth at bedtime. 09/05/20   Benjiman Core, MD  hydrOXYzine (ATARAX/VISTARIL) 50 MG tablet Take 1 tablet  (50 mg total) by mouth 3 (three) times daily as needed for anxiety. 09/05/20   Benjiman Core, MD  prazosin (MINIPRESS) 1 MG capsule Take 1 capsule (1 mg total) by mouth at bedtime. 09/05/20   Benjiman Core, MD  QUEtiapine (SEROQUEL) 300 MG tablet Take 1 tablet (300 mg total) by mouth at bedtime. 09/05/20   Benjiman Core, MD    Allergies    Metoprolol  Review of Systems   Review of Systems Ten systems are reviewed and are negative for acute change except as noted in the HPI  Physical Exam Updated Vital Signs BP (!) 122/97   Pulse 80   Temp 98.1 F (36.7 C) (Oral)   Resp 18   Ht 5\' 6"  (1.676 m)   Wt 113.4 kg   SpO2 97%   BMI 40.35 kg/m   Physical Exam Constitutional:      General: He is not in acute distress.    Appearance: Normal appearance. He is well-developed. He is not ill-appearing or diaphoretic.  HENT:     Head: Normocephalic and atraumatic.  Eyes:     General: Vision grossly intact. Gaze aligned appropriately.     Pupils: Pupils are equal, round, and reactive to light.  Neck:     Trachea: Trachea and phonation normal.  Cardiovascular:     Rate and Rhythm: Normal rate and regular rhythm.     Pulses: Normal pulses.  Pulmonary:     Effort: Pulmonary effort is normal. No respiratory distress.     Breath sounds: Normal breath sounds.  Abdominal:     General: There is no distension.     Palpations: Abdomen is soft.     Tenderness: There is no abdominal tenderness. There is no guarding or rebound.  Musculoskeletal:        General: Normal range of motion.     Cervical back: Normal range of motion.       Legs:     Comments: Palpable subcentimeter subcutaneous nodule to the right lower leg overlying the distal tibia.  No erythema fluctuance streaking or drainage.  Skin:    General: Skin is warm and dry.     Comments: Multiple linear scars well-healed of the upper extremities.  Neurological:     Mental Status: He is alert.     GCS: GCS eye subscore is 4. GCS  verbal subscore is 5. GCS motor subscore is 6.     Comments: Speech is clear and goal oriented, follows commands Major Cranial nerves without deficit, no facial droop Moves extremities without ataxia, coordination intact  Psychiatric:        Behavior: Behavior normal.     ED Results / Procedures / Treatments   Labs (all labs ordered are listed, but only abnormal results are displayed) Labs Reviewed - No data to display  EKG None  Radiology No results found.  Procedures Procedures (including critical care time)  Medications Ordered  in ED Medications  chlordiazePOXIDE (LIBRIUM) capsule 75 mg (75 mg Oral Given 09/21/20 1503)    ED Course  I have reviewed the triage vital signs and the nursing notes.  Pertinent labs & imaging results that were available during my care of the patient were reviewed by me and considered in my medical decision making (see chart for details).    MDM Rules/Calculators/A&P                         Additional history obtained from: 1. Nursing notes from this visit. 2. Review of electronic medical records.  Reviewed most recent behavioral health note from 09/20/2019, patient appeared to have been giving false information, he was encouraged to follow-up with peers support and he was given resources. --------------------------------- 50 year old male presented for help with alcohol withdrawal.  Fracture on initial evaluation patient's CIWA score is less than 8.  Additionally he does not appear intoxicated.  He is fully alert and oriented and appears to have the mental capacity to make his own medical decisions.  Additionally he denies any SI or HI and does not appear to be a danger to himself or others at this time.  He is requesting a Librium taper so that he can get back to his rehab facility in [facility].  Initially had ordered medical clearance labs on the patient but he has no medical complaint and does not appear to be inpatient criteria,  those were canceled and Librium was ordered, 75 mg capsule.  Discussed plan of care with attending physician Dr. Renaye Rakers who agrees with plan of care.  Patient's only physical concern today is a small subcutaneous bump to his right lower leg, visualizes area there is no overlying skin changes feels as though possible subcutaneous nodule patient is concerned about this being bone cancer, will obtain plain film x-ray.  Does not appear as cellulitis, abscess or other emergent pathologies. -------- Patient reassessed he reports he is feeling much better after receiving 75 mg dose of Librium. On reassessment he is calm, no tremors, denies any hallucinations, SI or HI. He reports that he has a family member who is put him up in a hotel and he no longer has any access to alcohol. I advised patient that he cannot take Librium with alcohol and I discussed the medication side effects and he stated understanding. He plans to go this Sunday to his rehab facility I have encouraged him to maintain that appointment and return to the ER for any new or worsening symptoms. Patient reports that his for members coming to pick him up and he no longer wants an x-ray of his leg, I feel this is reasonable as of below suspicion for fracture dislocation, infectious pathology or other emergent pathologies at this time. He was stable for discharge and PCP/rehab follow-up. Patient's Librium was sent over E prescription to [facility] outpatient pharmacy.  At this time there does not appear to be any evidence of an acute emergency medical condition and the patient appears stable for discharge with appropriate outpatient follow up. Diagnosis was discussed with patient who verbalizes understanding of care plan and is agreeable to discharge. I have discussed return precautions with patient who verbalizes understanding. Patient encouraged to follow-up with their PCP. All questions answered.  Patient's case discussed with Dr. Renaye Rakers who agrees  with plan to discharge with follow-up.   Note: Portions of this report may have been transcribed using voice recognition software. Every effort  was made to ensure accuracy; however, inadvertent computerized transcription errors may still be present. Final Clinical Impression(s) / ED Diagnoses Final diagnoses:  Alcohol withdrawal syndrome without complication (HCC)    Rx / DC Orders ED Discharge Orders         Ordered    chlordiazePOXIDE (LIBRIUM) 25 MG capsule  Status:  Discontinued        09/21/20 1616    chlordiazePOXIDE (LIBRIUM) 25 MG capsule  Status:  Discontinued        09/21/20 1618    chlordiazePOXIDE (LIBRIUM) 25 MG capsule        09/21/20 1622           Elizabeth PalauMorelli, Danon Lograsso A, PA-C 09/21/20 1624    Terald Sleeperrifan, Matthew J, MD 09/21/20 2206

## 2020-09-21 NOTE — Discharge Instructions (Addendum)
At this time there does not appear to be the presence of an emergent medical condition, however there is always the potential for conditions to change. Please read and follow the below instructions.  Please return to the Emergency Department immediately for any new or worsening symptoms. Please be sure to follow up with your Primary Care Provider within one week regarding your visit today; please call their office to schedule an appointment even if you are feeling better for a follow-up visit. Please take the Librium taper as prescribed to help avoid alcohol withdrawal.  Do not drink alcohol while taking Librium as will cause bad side-effects. Go to your rehab facility this weekend as you have scheduled.  Go to the nearest Emergency Department immediately if: You have fever or chills You have fast or uneven heartbeats. You have chest pain. You have trouble breathing. You have a seizure for the first time. You see, hear, feel, smell, or taste something that is not there. You get very confused. You have any new/concerning or worsening of symptoms   Please read the additional information packets attached to your discharge summary.  Do not take your medicine if  develop an itchy rash, swelling in your mouth or lips, or difficulty breathing; call 911 and seek immediate emergency medical attention if this occurs.  You may review your lab tests and imaging results in their entirety on your MyChart account.  Please discuss all results of fully with your primary care provider and other specialist at your follow-up visit.  Note: Portions of this text may have been transcribed using voice recognition software. Every effort was made to ensure accuracy; however, inadvertent computerized transcription errors may still be present.

## 2020-09-21 NOTE — ED Triage Notes (Signed)
Patient came with c/o alcohol withdrawal. BP in triage -122/97. Patient states he last drank 1.5 liters vodka 3 days ago and cocaine last week.  Patient denies any SI/HI, auditory hallucinations.  Patient does state that he does seeing "dancing lights."

## 2020-11-02 ENCOUNTER — Telehealth (HOSPITAL_COMMUNITY): Payer: Self-pay | Admitting: Licensed Clinical Social Worker

## 2020-11-02 ENCOUNTER — Ambulatory Visit (HOSPITAL_COMMUNITY): Payer: Medicare Other | Admitting: Licensed Clinical Social Worker

## 2021-06-24 ENCOUNTER — Emergency Department (HOSPITAL_COMMUNITY)
Admission: EM | Admit: 2021-06-24 | Discharge: 2021-06-25 | Disposition: A | Discharge status: Home / Self Care | Payer: Medicare Other | Attending: Student | Admitting: Student

## 2021-06-24 DIAGNOSIS — Z20822 Contact with and (suspected) exposure to covid-19: Secondary | ICD-10-CM | POA: Diagnosis not present

## 2021-06-24 DIAGNOSIS — I10 Essential (primary) hypertension: Secondary | ICD-10-CM | POA: Diagnosis not present

## 2021-06-24 DIAGNOSIS — Y904 Blood alcohol level of 80-99 mg/100 ml: Secondary | ICD-10-CM | POA: Diagnosis not present

## 2021-06-24 DIAGNOSIS — F10939 Alcohol use, unspecified with withdrawal, unspecified: Secondary | ICD-10-CM

## 2021-06-24 DIAGNOSIS — F419 Anxiety disorder, unspecified: Secondary | ICD-10-CM | POA: Diagnosis not present

## 2021-06-24 DIAGNOSIS — F10239 Alcohol dependence with withdrawal, unspecified: Secondary | ICD-10-CM | POA: Diagnosis present

## 2021-06-24 DIAGNOSIS — J45909 Unspecified asthma, uncomplicated: Secondary | ICD-10-CM | POA: Diagnosis not present

## 2021-06-24 DIAGNOSIS — Z79899 Other long term (current) drug therapy: Secondary | ICD-10-CM | POA: Diagnosis not present

## 2021-06-24 LAB — CBC WITH DIFFERENTIAL/PLATELET
Abs Immature Granulocytes: 0.04 10*3/uL (ref 0.00–0.07)
Basophils Absolute: 0 10*3/uL (ref 0.0–0.1)
Basophils Relative: 1 %
Eosinophils Absolute: 0.3 10*3/uL (ref 0.0–0.5)
Eosinophils Relative: 5 %
HCT: 41.7 % (ref 39.0–52.0)
Hemoglobin: 13.2 g/dL (ref 13.0–17.0)
Immature Granulocytes: 1 %
Lymphocytes Relative: 38 %
Lymphs Abs: 2.1 10*3/uL (ref 0.7–4.0)
MCH: 26.2 pg (ref 26.0–34.0)
MCHC: 31.7 g/dL (ref 30.0–36.0)
MCV: 82.7 fL (ref 80.0–100.0)
Monocytes Absolute: 0.6 10*3/uL (ref 0.1–1.0)
Monocytes Relative: 11 %
Neutro Abs: 2.5 10*3/uL (ref 1.7–7.7)
Neutrophils Relative %: 44 %
Platelets: 261 10*3/uL (ref 150–400)
RBC: 5.04 MIL/uL (ref 4.22–5.81)
RDW: 14.6 % (ref 11.5–15.5)
WBC: 5.6 10*3/uL (ref 4.0–10.5)
nRBC: 0 % (ref 0.0–0.2)

## 2021-06-24 NOTE — ED Triage Notes (Signed)
Pt here via [facility] for eval of anxiety & need for BP medication (reports he's been out for approx 3wks). Per pt, he's tried to cope w anxiety using alcohol, ineffective, 8-9 16oz a day.   VSS, hypertensive but hx same.

## 2021-06-24 NOTE — ED Provider Notes (Signed)
Emergency Medicine Provider Triage Evaluation Note  [name] , a 50 y.o. male  was evaluated in triage.  Pt complains of concern for alcohol withdrawal.  States he has been sober for about 8 months, started drinking heavily again 3 days ago.  He reports drinking approx 12-16, 16oz cans of 13% alcohol beers and some wine.  He used to drink heavily like this every day.  He denies SI/HI/AVH.  Reports feeling jittery and seeing shadows.  Some nausea without vomiting.  No fevers.    Review of Systems  Positive: Alcohol withdrawal, nausea Negative: Fever, vomiting  Physical Exam  BP (!) 159/107   Pulse 94   Temp 98.8 F (37.1 C)   Resp 18   SpO2 95%  Gen:   Awake, no distress, seems a bit jittery during exam Resp:  Normal effort  MSK:   Moves extremities without difficulty  Other:  Numerous self inflicted wounds noted to bilateral forearms, no acute wounds or active bleeding  Medical Decision Making  Medically screening exam initiated at 10:14 PM.  Appropriate orders placed.  SEUNG NIDIFFER was informed that the remainder of the evaluation will be completed by another provider, this initial triage assessment does not replace that evaluation, and the importance of remaining in the ED until their evaluation is complete.  Recent alcohol relapse.  Feels like he is going through withdrawal again.  Labs, EKG, CIWA score.   Garlon Hatchet, PA-C 06/24/21 2219    Blane Ohara, MD 06/25/21 Marlyne Beards

## 2021-06-25 ENCOUNTER — Other Ambulatory Visit: Payer: Self-pay

## 2021-06-25 ENCOUNTER — Other Ambulatory Visit (HOSPITAL_COMMUNITY): Payer: Self-pay

## 2021-06-25 DIAGNOSIS — F10239 Alcohol dependence with withdrawal, unspecified: Secondary | ICD-10-CM | POA: Diagnosis not present

## 2021-06-25 LAB — RAPID URINE DRUG SCREEN, HOSP PERFORMED
Amphetamines: NOT DETECTED
Barbiturates: NOT DETECTED
Benzodiazepines: NOT DETECTED
Cocaine: NOT DETECTED
Opiates: NOT DETECTED
Tetrahydrocannabinol: NOT DETECTED

## 2021-06-25 LAB — COMPREHENSIVE METABOLIC PANEL
ALT: 14 U/L (ref 0–44)
AST: 25 U/L (ref 15–41)
Albumin: 3.5 g/dL (ref 3.5–5.0)
Alkaline Phosphatase: 121 U/L (ref 38–126)
Anion gap: 11 (ref 5–15)
BUN: 5 mg/dL — ABNORMAL LOW (ref 6–20)
CO2: 25 mmol/L (ref 22–32)
Calcium: 8 mg/dL — ABNORMAL LOW (ref 8.9–10.3)
Chloride: 104 mmol/L (ref 98–111)
Creatinine, Ser: 0.63 mg/dL (ref 0.61–1.24)
GFR, Estimated: >60 mL/min (ref >60)
Glucose, Bld: 103 mg/dL — ABNORMAL HIGH (ref 70–99)
Potassium: 3.2 mmol/L — ABNORMAL LOW (ref 3.5–5.1)
Sodium: 140 mmol/L (ref 135–145)
Total Bilirubin: 0.4 mg/dL (ref 0.3–1.2)
Total Protein: 6.8 g/dL (ref 6.5–8.1)

## 2021-06-25 LAB — RESP PANEL BY RT-PCR (FLU A&B, COVID) ARPGX2
Influenza A by PCR: NEGATIVE
Influenza B by PCR: NEGATIVE
SARS Coronavirus 2 by RT PCR: NEGATIVE

## 2021-06-25 LAB — ETHANOL: Alcohol, Ethyl (B): 87 mg/dL — ABNORMAL HIGH (ref <10)

## 2021-06-25 MED ORDER — MAGNESIUM OXIDE -MG SUPPLEMENT 400 (240 MG) MG PO TABS
800.0000 mg | ORAL_TABLET | Freq: Once | ORAL | Status: AC
  Administered 2021-06-25: 800 mg via ORAL
  Filled 2021-06-25: qty 2

## 2021-06-25 MED ORDER — POTASSIUM CHLORIDE CRYS ER 20 MEQ PO TBCR
40.0000 meq | EXTENDED_RELEASE_TABLET | Freq: Once | ORAL | Status: AC
  Administered 2021-06-25: 40 meq via ORAL
  Filled 2021-06-25: qty 2

## 2021-06-25 MED ORDER — LORAZEPAM 2 MG/ML IJ SOLN: 0.0000 mg | Freq: Four times a day (QID) | INTRAMUSCULAR | Status: DC

## 2021-06-25 MED ORDER — THIAMINE HCL 100 MG/ML IJ SOLN: 100.0000 mg | Freq: Every day | INTRAMUSCULAR | Status: DC

## 2021-06-25 MED ORDER — LORAZEPAM 2 MG/ML IJ SOLN: 0.0000 mg | Freq: Two times a day (BID) | INTRAMUSCULAR | Status: DC

## 2021-06-25 MED ORDER — ONDANSETRON HCL 4 MG PO TABS: 4.0000 mg | ORAL_TABLET | Freq: Three times a day (TID) | ORAL | Status: DC | PRN

## 2021-06-25 MED ORDER — LORAZEPAM 1 MG PO TABS: 0.0000 mg | ORAL_TABLET | Freq: Two times a day (BID) | ORAL | Status: DC

## 2021-06-25 MED ORDER — THIAMINE HCL 100 MG PO TABS
100.0000 mg | ORAL_TABLET | Freq: Every day | ORAL | Status: DC
  Administered 2021-06-25: 100 mg via ORAL
  Filled 2021-06-25: qty 1

## 2021-06-25 MED ORDER — LORAZEPAM 1 MG PO TABS
1.0000 mg | ORAL_TABLET | Freq: Once | ORAL | Status: AC
  Administered 2021-06-25: 1 mg via ORAL

## 2021-06-25 MED ORDER — CHLORDIAZEPOXIDE HCL 25 MG PO CAPS
ORAL_CAPSULE | ORAL | 0 refills | Status: DC
  Filled 2021-06-25: qty 10, 4d supply, fill #0

## 2021-06-25 MED ORDER — ACETAMINOPHEN 325 MG PO TABS
650.0000 mg | ORAL_TABLET | Freq: Once | ORAL | Status: AC
  Administered 2021-06-25: 650 mg via ORAL
  Filled 2021-06-25: qty 2

## 2021-06-25 MED ORDER — LORAZEPAM 1 MG PO TABS
0.0000 mg | ORAL_TABLET | Freq: Four times a day (QID) | ORAL | Status: DC
  Administered 2021-06-25: 1 mg via ORAL
  Filled 2021-06-25: qty 1

## 2021-06-25 NOTE — ED Provider Notes (Signed)
[facility] EMERGENCY DEPARTMENT Provider Note   CSN: [id number] Arrival date & time: 06/24/21  2049     History Chief Complaint  Patient presents with   Anxiety   Alcohol Intoxication    Withdrawal    [name] is a 50 y.o. male with PMH alcohol dependence with withdrawal, bipolar disorder, polysubstance abuse, HTN who presents to the emergency department for evaluation of anxiety and alcohol relapse.  Patient has multiple previous ER visits for similar presentation, but his last ER visit appears to be 6 months ago.  He states that he was in the grocery store today and because he cannot afford rising food prices, he decided it was not worth living and relapsed by drinking 6 beers.  He arrives today tremulous with anxiety and hypertensive.   Anxiety Pertinent negatives include no chest pain, no abdominal pain and no shortness of breath.  Alcohol Intoxication Pertinent negatives include no chest pain, no abdominal pain and no shortness of breath.      Past Medical History:  Diagnosis Date   Alcohol dependence with withdrawal (HCC)    Anxiety    Asthma    Atrial fibrillation (HCC)    not on AC   Bipolar 1 disorder (HCC)    Class 2 obesity    Cocaine abuse (HCC)    Depression    Heart palpitations    with severe anxiety   Hypertension    PTSD (post-traumatic stress disorder)    PTSD (post-traumatic stress disorder)     Patient Active Problem List   Diagnosis Date Noted   Malingering 09/19/2020   Alcohol dependence with alcohol-induced mood disorder (HCC)    Alcohol withdrawal (HCC) 08/22/2020   Bipolar 1 disorder (HCC) 06/20/2020   MDD (major depressive disorder), recurrent, severe, with psychosis (HCC) 06/20/2020   Cocaine abuse (HCC) 06/14/2020   Class 2 obesity due to excess calories with body mass index (BMI) of 39.0 to 39.9 in adult 06/14/2020   Alcohol abuse with alcohol-induced mood disorder (HCC) 01/18/2017   Major depressive  disorder, recurrent, severe without psychotic features (HCC)    Cocaine dependence with cocaine-induced mood disorder (HCC)    Alcohol use disorder, severe, dependence (HCC) 04/12/2015   Hypertension 09/08/2011   Post traumatic stress disorder (PTSD) 09/08/2011    Past Surgical History:  Procedure Laterality Date   NO PAST SURGERIES         Family History  Problem Relation Age of Onset   Alcohol abuse Mother    Hypertension Father    Dementia Father    Alcohol abuse Maternal Grandfather     Social History   Tobacco Use   Smoking status: Never   Smokeless tobacco: Never  Vaping Use   Vaping Use: Never used  Substance Use Topics   Alcohol use: Yes   Drug use: Yes    Types: Cocaine    Home Medications Prior to Admission medications   Medication Sig Start Date End Date Taking? Authorizing Provider  amLODipine (NORVASC) 5 MG tablet Take 1 tablet (5 mg total) by mouth daily. 09/05/20   Benjiman Core, MD  citalopram (CELEXA) 20 MG tablet Take 1 tablet (20 mg total) by mouth daily. 09/05/20   Benjiman Core, MD  cloNIDine (CATAPRES) 0.2 MG tablet Take 1 tablet (0.2 mg total) by mouth 3 (three) times daily as needed. 09/05/20   Benjiman Core, MD  doxepin (SINEQUAN) 10 MG capsule Take 1 capsule (10 mg total) by mouth at bedtime. 09/05/20  Benjiman Core, MD  hydrOXYzine (ATARAX/VISTARIL) 50 MG tablet Take 1 tablet (50 mg total) by mouth 3 (three) times daily as needed for anxiety. 09/05/20   Benjiman Core, MD  prazosin (MINIPRESS) 1 MG capsule Take 1 capsule (1 mg total) by mouth at bedtime. 09/05/20   Benjiman Core, MD  QUEtiapine (SEROQUEL) 300 MG tablet Take 1 tablet (300 mg total) by mouth at bedtime. 09/05/20   Benjiman Core, MD    Allergies    Metoprolol  Review of Systems   Review of Systems  Constitutional:  Negative for chills and fever.  HENT:  Negative for ear pain and sore throat.   Eyes:  Negative for pain and visual disturbance.  Respiratory:   Negative for cough and shortness of breath.   Cardiovascular:  Negative for chest pain and palpitations.  Gastrointestinal:  Negative for abdominal pain and vomiting.  Genitourinary:  Negative for dysuria and hematuria.  Musculoskeletal:  Negative for arthralgias and back pain.  Skin:  Negative for color change and rash.  Neurological:  Positive for tremors. Negative for seizures and syncope.  Psychiatric/Behavioral:  The patient is nervous/anxious.   All other systems reviewed and are negative.  Physical Exam Updated Vital Signs BP (!) 144/100   Pulse 84   Temp 98.5 F (36.9 C) (Oral)   Resp 20   SpO2 98%   Physical Exam Vitals and nursing note reviewed.  Constitutional:      Appearance: He is well-developed.  HENT:     Head: Normocephalic and atraumatic.  Eyes:     Conjunctiva/sclera: Conjunctivae normal.  Cardiovascular:     Rate and Rhythm: Normal rate and regular rhythm.     Heart sounds: No murmur heard. Pulmonary:     Effort: Pulmonary effort is normal. No respiratory distress.     Breath sounds: Normal breath sounds.  Abdominal:     Palpations: Abdomen is soft.     Tenderness: There is no abdominal tenderness.  Musculoskeletal:     Cervical back: Neck supple.  Skin:    General: Skin is warm and dry.  Neurological:     Mental Status: He is alert.    ED Results / Procedures / Treatments   Labs (all labs ordered are listed, but only abnormal results are displayed) Labs Reviewed  COMPREHENSIVE METABOLIC PANEL - Abnormal; Notable for the following components:      Result Value   Potassium 3.2 (*)    Glucose, Bld 103 (*)    BUN 5 (*)    Calcium 8.0 (*)    All other components within normal limits  ETHANOL - Abnormal; Notable for the following components:   Alcohol, Ethyl (B) 87 (*)    All other components within normal limits  RESP PANEL BY RT-PCR (FLU A&B, COVID) ARPGX2  CBC WITH DIFFERENTIAL/PLATELET  RAPID URINE DRUG SCREEN, HOSP PERFORMED     EKG EKG Interpretation  Date/Time:  Monday June 24 2021 22:29:40 EDT Ventricular Rate:  88 PR Interval:  170 QRS Duration: 98 QT Interval:  374 QTC Calculation: 452 R Axis:   207 Text Interpretation: Sinus rhythm with occasional Premature ventricular complexes Right superior axis deviation Possible Anterior infarct , age undetermined ST & T wave abnormality, consider inferior ischemia Abnormal ECG Confirmed by Zadie Rhine ([id number]) on 06/25/2021 8:38:53 AM  Radiology No results found.  Procedures Procedures   Medications Ordered in ED Medications  LORazepam (ATIVAN) injection 0-4 mg (has no administration in time range)    Or  LORazepam (ATIVAN)  tablet 0-4 mg (has no administration in time range)  LORazepam (ATIVAN) injection 0-4 mg (has no administration in time range)    Or  LORazepam (ATIVAN) tablet 0-4 mg (has no administration in time range)  thiamine tablet 100 mg (has no administration in time range)    Or  thiamine (B-1) injection 100 mg (has no administration in time range)  ondansetron (ZOFRAN) tablet 4 mg (has no administration in time range)  LORazepam (ATIVAN) tablet 1 mg (has no administration in time range)    ED Course  I have reviewed the triage vital signs and the nursing notes.  Pertinent labs & imaging results that were available during my care of the patient were reviewed by me and considered in my medical decision making (see chart for details).    MDM Rules/Calculators/A&P                           Patient seen emergency department for evaluation of anxiety and concern for alcohol withdrawal.  Physical exam reveals a tremulous anxious patient but is otherwise unremarkable.  On arrival, patient not significantly tachycardic but was hypertensive and did appear to be in mild alcohol withdrawal.  He got a single dose of Ativan here in the emergency department and will be discharged on a Librium taper.  He spoke with TTS and reported back to me  that he has plans to follow-up with AA meetings and will pursue outpatient resources for alcohol abuse.  I had a long discussion with the patient about the risks of continuing to drink while on Librium and advised him not to drink while on this medication and he voiced understanding of this.  He was given return precautions of which he also voiced understanding and discharged. Final Clinical Impression(s) / ED Diagnoses Final diagnoses:  None    Rx / DC Orders ED Discharge Orders     None        Ezechiel Stooksbury, MD 06/25/21 1322
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
## 2021-07-18 ENCOUNTER — Other Ambulatory Visit (HOSPITAL_COMMUNITY): Payer: Self-pay

## 2021-07-18 ENCOUNTER — Encounter (HOSPITAL_COMMUNITY): Payer: Self-pay

## 2021-07-18 ENCOUNTER — Emergency Department (HOSPITAL_COMMUNITY)
Admission: EM | Admit: 2021-07-18 | Discharge: 2021-07-18 | Disposition: A | Discharge status: Home / Self Care | Payer: Medicare Other | Attending: Emergency Medicine | Admitting: Emergency Medicine

## 2021-07-18 DIAGNOSIS — I10 Essential (primary) hypertension: Secondary | ICD-10-CM | POA: Diagnosis not present

## 2021-07-18 DIAGNOSIS — J45909 Unspecified asthma, uncomplicated: Secondary | ICD-10-CM | POA: Insufficient documentation

## 2021-07-18 DIAGNOSIS — Z20822 Contact with and (suspected) exposure to covid-19: Secondary | ICD-10-CM | POA: Diagnosis not present

## 2021-07-18 DIAGNOSIS — F10239 Alcohol dependence with withdrawal, unspecified: Secondary | ICD-10-CM | POA: Diagnosis not present

## 2021-07-18 DIAGNOSIS — Y906 Blood alcohol level of 120-199 mg/100 ml: Secondary | ICD-10-CM | POA: Diagnosis not present

## 2021-07-18 DIAGNOSIS — Z79899 Other long term (current) drug therapy: Secondary | ICD-10-CM | POA: Insufficient documentation

## 2021-07-18 DIAGNOSIS — F10930 Alcohol use, unspecified with withdrawal, uncomplicated: Secondary | ICD-10-CM

## 2021-07-18 LAB — CBC WITH DIFFERENTIAL/PLATELET
Abs Immature Granulocytes: 0.02 10*3/uL (ref 0.00–0.07)
Basophils Absolute: 0 10*3/uL (ref 0.0–0.1)
Basophils Relative: 1 %
Eosinophils Absolute: 0.1 10*3/uL (ref 0.0–0.5)
Eosinophils Relative: 2 %
HCT: 42.2 % (ref 39.0–52.0)
Hemoglobin: 13.5 g/dL (ref 13.0–17.0)
Immature Granulocytes: 0 %
Lymphocytes Relative: 35 %
Lymphs Abs: 1.8 10*3/uL (ref 0.7–4.0)
MCH: 26.8 pg (ref 26.0–34.0)
MCHC: 32 g/dL (ref 30.0–36.0)
MCV: 83.9 fL (ref 80.0–100.0)
Monocytes Absolute: 0.5 10*3/uL (ref 0.1–1.0)
Monocytes Relative: 9 %
Neutro Abs: 2.6 10*3/uL (ref 1.7–7.7)
Neutrophils Relative %: 53 %
Platelets: 236 10*3/uL (ref 150–400)
RBC: 5.03 MIL/uL (ref 4.22–5.81)
RDW: 16.7 % — ABNORMAL HIGH (ref 11.5–15.5)
WBC: 5 10*3/uL (ref 4.0–10.5)
nRBC: 0 % (ref 0.0–0.2)

## 2021-07-18 LAB — COMPREHENSIVE METABOLIC PANEL
ALT: 21 U/L (ref 0–44)
AST: 31 U/L (ref 15–41)
Albumin: 3.8 g/dL (ref 3.5–5.0)
Alkaline Phosphatase: 134 U/L — ABNORMAL HIGH (ref 38–126)
Anion gap: 8 (ref 5–15)
BUN: <5 mg/dL — ABNORMAL LOW (ref 6–20)
CO2: 23 mmol/L (ref 22–32)
Calcium: 7.4 mg/dL — ABNORMAL LOW (ref 8.9–10.3)
Chloride: 108 mmol/L (ref 98–111)
Creatinine, Ser: 0.61 mg/dL (ref 0.61–1.24)
GFR, Estimated: >60 mL/min (ref >60)
Glucose, Bld: 136 mg/dL — ABNORMAL HIGH (ref 70–99)
Potassium: 3 mmol/L — ABNORMAL LOW (ref 3.5–5.1)
Sodium: 139 mmol/L (ref 135–145)
Total Bilirubin: 0.5 mg/dL (ref 0.3–1.2)
Total Protein: 7 g/dL (ref 6.5–8.1)

## 2021-07-18 LAB — ETHANOL: Alcohol, Ethyl (B): 171 mg/dL — ABNORMAL HIGH (ref <10)

## 2021-07-18 LAB — RESP PANEL BY RT-PCR (FLU A&B, COVID) ARPGX2
Influenza A by PCR: NEGATIVE
Influenza B by PCR: NEGATIVE
SARS Coronavirus 2 by RT PCR: NEGATIVE

## 2021-07-18 LAB — ACETAMINOPHEN LEVEL: Acetaminophen (Tylenol), Serum: <10 ug/mL — ABNORMAL LOW (ref 10–30)

## 2021-07-18 LAB — SALICYLATE LEVEL: Salicylate Lvl: <7 mg/dL — ABNORMAL LOW (ref 7.0–30.0)

## 2021-07-18 MED ORDER — CHLORDIAZEPOXIDE HCL 25 MG PO CAPS
ORAL_CAPSULE | ORAL | 0 refills | Status: DC
  Filled 2021-07-18: qty 10, 3d supply, fill #0

## 2021-07-18 MED ORDER — LORAZEPAM 1 MG PO TABS
1.0000 mg | ORAL_TABLET | Freq: Once | ORAL | Status: AC
  Administered 2021-07-18: 14:00:00 1 mg via ORAL
  Filled 2021-07-18: qty 1

## 2021-07-18 NOTE — ED Triage Notes (Signed)
Pt presents with c/o request for alcohol detox. Pt reports that he is not SI or HI, possible tremors from withdrawal. Pt has significant psych hx.

## 2021-07-18 NOTE — ED Provider Notes (Signed)
Emergency Medicine Provider Triage Evaluation Note  [name] , a 50 y.o. male  was evaluated in triage.  Pt complains of alcohol withdrawal and depression.  Patient reports he has been on a 4-day drinking binge, reports he has been self-medicating as he has been going through a lot after the recent loss of his mother.  He denies other drug use, last drink between 11 PM and 2 AM last night.  Reports today he is feeling increasingly tremulous with palpitations, and has had some visual hallucinations where he has been seeing black spots and figures.  He reports he is not suicidal but has been feeling more little less and is concerned about what he would do so came in today to get help.  He reports, "I have been feeling helpless and did not know what to do, I came here to get help today because I do not want to die".  He has numerous scars going up and down his arms from prior cutting but denies self-injurious behavior today.  Denies chest pain, abdominal pain, vomiting.  Extensive psychiatric history.  Review of Systems  Positive: Tremulousness, palpitations, hallucinations, depression Negative: Chest pain, shortness of breath, abdominal pain, vomiting  Physical Exam  BP (!) 144/89 (BP Location: Left Arm)   Pulse 93   Temp 98.3 F (36.8 C) (Oral)   Resp 17   SpO2 95%  Gen:   Awake, no distress, appears very anxious, somewhat tremulous, tearful Resp:  Normal effort  MSK:   Moves extremities without difficulty  Other:    Medical Decision Making  Medically screening exam initiated at 1:07 PM.  Appropriate orders placed.  VANDEN FAWAZ was informed that the remainder of the evaluation will be completed by another provider, this initial triage assessment does not replace that evaluation, and the importance of remaining in the ED until their evaluation is complete.     Dartha Lodge, PA-C 07/18/21 1313    Ernie Avena, MD 07/18/21 1329

## 2021-07-18 NOTE — Discharge Instructions (Addendum)
You were evaluated in the Emergency Department and after careful evaluation, we did not find any emergent condition requiring admission or further testing in the hospital.  Your exam/testing today was overall reassuring. We will treat your symptoms with a Librium taper.  Please return to the Emergency Department if you experience any worsening of your condition.  Thank you for allowing [place] to be a part of your care.

## 2021-07-18 NOTE — BH Assessment (Signed)
Per his RN Lum Babe, he is being discharged. No assessment needed.

## 2021-07-18 NOTE — ED Provider Notes (Signed)
[facility] DEPT Provider Note   CSN: [id number] Arrival date & time: 07/18/21  Jan 05, 1245     History Chief Complaint  Patient presents with   Detox     [name] is a 50 y.o. male.  HPI  50 year old male with a history of alcohol abuse, bipolar disorder, depression presenting to the emergency department with concern for alcohol withdrawal and need for detox.  The patient states that he typically drinks four 16 ounce 14% alcohol beverages daily.  If he has not had a drink for 4 days he will often develop shakes and tremors.  He presented to the emergency department due to desire to quit drinking initially.  He states that he wants to quit alcohol in the setting of the holidays and utilizing alcohol to cope through the grief over the loss of his mother who passed away in 2015-01-06.  He states that he has been managing his anxiety and depression with Lexapro.  He denies any SI, HI or AVH.    Past Medical History:  Diagnosis Date   Alcohol dependence with withdrawal ([place])    Anxiety    Asthma    Atrial fibrillation ([place])    not on AC   Bipolar 1 disorder (HCC)    Class 2 obesity    Cocaine abuse ([place])    Depression    Heart palpitations    with severe anxiety   Hypertension    PTSD (post-traumatic stress disorder)    PTSD (post-traumatic stress disorder)     Patient Active Problem List   Diagnosis Date Noted   Malingering 09/19/2020   Alcohol dependence with alcohol-induced mood disorder ([place])    Alcohol withdrawal ([place]) 08/22/2020   Bipolar 1 disorder ([place]) 06/20/2020   MDD (major depressive disorder), recurrent, severe, 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) 06/14/2020   Class 2 obesity due to excess calories with body mass index (BMI) of 39.0 to 39.9 in adult 06/14/2020   Alcohol abuse with alcohol-induced mood disorder ([place]) 01/18/2017   Major depressive disorder, recurrent, severe without psychotic features ([place])    Cocaine  dependence with cocaine-induced mood disorder ([place])    Alcohol use disorder, severe, dependence ([place]) 04/12/2015   Hypertension 09/08/2011   Post traumatic stress disorder (PTSD) 09/08/2011    Past Surgical History:  Procedure Laterality Date   NO PAST SURGERIES         Family History  Problem Relation Age of Onset   Alcohol abuse Mother    Hypertension Father    Dementia Father    Alcohol abuse Maternal Grandfather     Social History   Tobacco Use   Smoking status: Never   Smokeless tobacco: Never  Vaping Use   Vaping Use: Never used  Substance Use Topics   Alcohol use: Yes   Drug use: Yes    Types: Cocaine    Home Medications Prior to Admission medications   Medication Sig Start Date End Date Taking? Authorizing Provider  amLODipine (NORVASC) 5 MG tablet Take 1 tablet (5 mg total) by mouth daily. 09/05/20   Davonna Belling, MD  chlordiazePOXIDE (LIBRIUM) 25 MG capsule take 2 capsules (50mg ) 3 times daily for x1Day , then 1-2 capsules (25-50mg ) twice daily for x1 Day, then 1-2 capsules (25-50mg ) once daily X1 Day 07/18/21   Regan Lemming, MD  citalopram (CELEXA) 20 MG tablet Take 1 tablet (20 mg total) by mouth daily. 09/05/20   Davonna Belling, MD  cloNIDine (CATAPRES) 0.2  MG tablet Take 1 tablet (0.2 mg total) by mouth 3 (three) times daily as needed. 09/05/20   Benjiman Core, MD  doxepin (SINEQUAN) 10 MG capsule Take 1 capsule (10 mg total) by mouth at bedtime. 09/05/20   Benjiman Core, MD  hydrOXYzine (ATARAX/VISTARIL) 50 MG tablet Take 1 tablet (50 mg total) by mouth 3 (three) times daily as needed for anxiety. 09/05/20   Benjiman Core, MD  prazosin (MINIPRESS) 1 MG capsule Take 1 capsule (1 mg total) by mouth at bedtime. 09/05/20   Benjiman Core, MD  QUEtiapine (SEROQUEL) 300 MG tablet Take 1 tablet (300 mg total) by mouth at bedtime. 09/05/20   Benjiman Core, MD    Allergies    Metoprolol  Review of Systems   Review of Systems  Constitutional:   Negative for chills and fever.  HENT:  Negative for ear pain and sore throat.   Eyes:  Negative for pain and visual disturbance.  Respiratory:  Negative for cough and shortness of breath.   Cardiovascular:  Negative for chest pain and palpitations.  Gastrointestinal:  Negative for abdominal pain and vomiting.  Genitourinary:  Negative for dysuria and hematuria.  Musculoskeletal:  Negative for arthralgias and back pain.  Skin:  Negative for color change and rash.  Neurological:  Negative for seizures and syncope.  Psychiatric/Behavioral:  The patient is nervous/anxious.   All other systems reviewed and are negative.  Physical Exam Updated Vital Signs BP (!) 165/112   Pulse (!) 104   Temp 98.3 F (36.8 C) (Oral)   Resp 20   SpO2 95%   Physical Exam Vitals and nursing note reviewed.  Constitutional:      General: He is not in acute distress.    Appearance: He is well-developed.  HENT:     Head: Normocephalic and atraumatic.  Eyes:     Conjunctiva/sclera: Conjunctivae normal.     Pupils: Pupils are equal, round, and reactive to light.  Cardiovascular:     Rate and Rhythm: Normal rate and regular rhythm.     Heart sounds: No murmur heard. Pulmonary:     Effort: Pulmonary effort is normal. No respiratory distress.     Breath sounds: Normal breath sounds.  Abdominal:     General: There is no distension.     Palpations: Abdomen is soft.     Tenderness: There is no abdominal tenderness. There is no guarding.  Musculoskeletal:        General: No swelling, deformity or signs of injury.     Cervical back: Normal range of motion and neck supple.  Skin:    General: Skin is warm and dry.     Capillary Refill: Capillary refill takes less than 2 seconds.     Findings: No lesion or rash.  Neurological:     General: No focal deficit present.     Mental Status: He is alert. Mental status is at baseline.  Psychiatric:        Attention and Perception: Attention and perception normal.  He does not perceive visual hallucinations.        Mood and Affect: Affect normal. Mood is anxious. Mood is not depressed. Affect is not labile or tearful.        Speech: Speech normal.        Behavior: Behavior normal. Behavior is cooperative.        Thought Content: Thought content normal. Thought content does not include homicidal or suicidal ideation.    ED Results / Procedures / Treatments  Labs (all labs ordered are listed, but only abnormal results are displayed) Labs Reviewed  COMPREHENSIVE METABOLIC PANEL - Abnormal; Notable for the following components:      Result Value   Potassium 3.0 (*)    Glucose, Bld 136 (*)    BUN <5 (*)    Calcium 7.4 (*)    Alkaline Phosphatase 134 (*)    All other components within normal limits  ETHANOL - Abnormal; Notable for the following components:   Alcohol, Ethyl (B) 171 (*)    All other components within normal limits  CBC WITH DIFFERENTIAL/PLATELET - Abnormal; Notable for the following components:   RDW 16.7 (*)    All other components within normal limits  ACETAMINOPHEN LEVEL - Abnormal; Notable for the following components:   Acetaminophen (Tylenol), Serum <10 (*)    All other components within normal limits  SALICYLATE LEVEL - Abnormal; Notable for the following components:   Salicylate Lvl [id number] (*)    All other components within normal limits  RESP PANEL BY RT-PCR (FLU A&B, COVID) ARPGX2  RAPID URINE DRUG SCREEN, HOSP PERFORMED    EKG EKG Interpretation  Date/Time:  Thursday July 18 2021 14:04:44 EST Ventricular Rate:  98 PR Interval:  184 QRS Duration: 111 QT Interval:  370 QTC Calculation: 473 R Axis:   -55 Text Interpretation: Sinus rhythm LAD, consider left anterior fascicular block Anterior infarct, old Confirmed by Regan Lemming ([id number]) on 07/18/2021 2:25:11 PM  Radiology No results found.  Procedures Procedures   Medications Ordered in ED Medications  LORazepam (ATIVAN) tablet 1 mg (1 mg Oral Given  07/18/21 1355)    ED Course  I have reviewed the triage vital signs and the nursing notes.  Pertinent labs & imaging results that were available during my care of the patient were reviewed by me and considered in my medical decision making (see chart for details).    MDM Rules/Calculators/A&P                          50 year old male with a history of alcohol abuse, bipolar disorder, depression presenting to the emergency department with concern for alcohol withdrawal and need for detox.  The patient states that he typically drinks four 16 ounce 14% alcohol beverages daily.  He states that if he has not had a drink for 4 days he will often develop shakes and tremors.  He presented to the emergency department due to desire to quit drinking initially.  He states that he wants to quit alcohol in the setting of the holidays and utilizing alcohol to cope through the grief over the loss of his mother who passed away in Jan 03, 2015.  He states that he has been managing his anxiety and depression with Lexapro.  He denies any SI, HI. States he occasionally sees black spots but is not actively hallucinating or responding to internal stimuli.   While in triage, the patient changed his mind and decided he did not want treatment for alcohol withdrawal to quit and instead requested a prescription for Librium.  Some initial concerning statements had prompted an IVC from triage, however on my evaluation bedside with on site staff psychiatry present as well, the patient is appropriate, calm and cooperative, denying SI, HI or AVH.  He has good outpatient follow-up and has follow-up with an ACT.  Mild tachycardia noted.  Patient was initially treated with p.o. Ativan.  No sniffing and tremor noted.  No concern for alcoholic hallucinosis  or delirium tremens.  As patient is no longer requesting inpatient treatment for alcohol detox, I believe a prescription for Librium is safe and appropriate.  No risk for the [place] suicide  risk screening tool.  The patient's IVC was reversed.  Plan for Librium prescription and routine follow-up with psychiatry outpatient.   Final Clinical Impression(s) / ED Diagnoses Final diagnoses:  Alcohol withdrawal syndrome without complication ([place])    Rx / DC Orders ED Discharge Orders          Ordered    chlordiazePOXIDE (LIBRIUM) 25 MG capsule        07/18/21 1518             Regan Lemming, MD 07/18/21 1531

## 2021-07-18 NOTE — ED Notes (Signed)
Patient DC off unit to home per provider. Patient alert, cooperative, no s/s of distress. Patient denied SI and HI at this time. Patient given DC information and resources. Belongings given to patient. Patient ambulatory off unit, escorted by RN. Patient transported by Child psychotherapist.

## 2021-07-22 ENCOUNTER — Encounter (HOSPITAL_COMMUNITY): Payer: Self-pay | Admitting: Emergency Medicine

## 2021-07-22 ENCOUNTER — Other Ambulatory Visit: Payer: Self-pay

## 2021-07-22 ENCOUNTER — Emergency Department (HOSPITAL_COMMUNITY)
Admission: EM | Admit: 2021-07-22 | Discharge: 2021-07-23 | Disposition: A | Discharge status: Home / Self Care | Payer: Medicare Other | Attending: Student | Admitting: Student

## 2021-07-22 DIAGNOSIS — F1410 Cocaine abuse, uncomplicated: Secondary | ICD-10-CM | POA: Insufficient documentation

## 2021-07-22 DIAGNOSIS — Z5321 Procedure and treatment not carried out due to patient leaving prior to being seen by health care provider: Secondary | ICD-10-CM | POA: Insufficient documentation

## 2021-07-22 DIAGNOSIS — F1010 Alcohol abuse, uncomplicated: Secondary | ICD-10-CM | POA: Diagnosis not present

## 2021-07-22 DIAGNOSIS — Z91128 Patient's intentional underdosing of medication regimen for other reason: Secondary | ICD-10-CM | POA: Insufficient documentation

## 2021-07-22 DIAGNOSIS — R079 Chest pain, unspecified: Secondary | ICD-10-CM | POA: Insufficient documentation

## 2021-07-22 DIAGNOSIS — T424X6A Underdosing of benzodiazepines, initial encounter: Secondary | ICD-10-CM | POA: Insufficient documentation

## 2021-07-22 LAB — RAPID URINE DRUG SCREEN, HOSP PERFORMED
Amphetamines: NOT DETECTED
Barbiturates: NOT DETECTED
Benzodiazepines: POSITIVE — AB
Cocaine: POSITIVE — AB
Opiates: NOT DETECTED
Tetrahydrocannabinol: NOT DETECTED

## 2021-07-22 LAB — CBC
HCT: 42.8 % (ref 39.0–52.0)
Hemoglobin: 13.3 g/dL (ref 13.0–17.0)
MCH: 26.6 pg (ref 26.0–34.0)
MCHC: 31.1 g/dL (ref 30.0–36.0)
MCV: 85.6 fL (ref 80.0–100.0)
Platelets: 258 10*3/uL (ref 150–400)
RBC: 5 MIL/uL (ref 4.22–5.81)
RDW: 16.3 % — ABNORMAL HIGH (ref 11.5–15.5)
WBC: 7.2 10*3/uL (ref 4.0–10.5)
nRBC: 0 % (ref 0.0–0.2)

## 2021-07-22 LAB — BASIC METABOLIC PANEL
Anion gap: 8 (ref 5–15)
BUN: 6 mg/dL (ref 6–20)
CO2: 25 mmol/L (ref 22–32)
Calcium: 8.1 mg/dL — ABNORMAL LOW (ref 8.9–10.3)
Chloride: 106 mmol/L (ref 98–111)
Creatinine, Ser: 0.73 mg/dL (ref 0.61–1.24)
GFR, Estimated: >60 mL/min (ref >60)
Glucose, Bld: 89 mg/dL (ref 70–99)
Potassium: 3.8 mmol/L (ref 3.5–5.1)
Sodium: 139 mmol/L (ref 135–145)

## 2021-07-22 LAB — TROPONIN I (HIGH SENSITIVITY): Troponin I (High Sensitivity): 7 ng/L (ref <18)

## 2021-07-22 NOTE — ED Triage Notes (Signed)
Patient [place] reports left chest pain this afternoon radiating to left neck with mild SOB , no emesis or diaphoresis , he received ASA 324 mg and 1 NTG sl by EMS prior to arrival . Patient added he has not taken his psychiatric medications for several days .

## 2021-07-22 NOTE — ED Notes (Signed)
PT decided not to be seen.

## 2021-07-22 NOTE — ED Provider Notes (Signed)
Emergency Medicine Provider Triage Evaluation Note  [name] , a 50 y.o. male  was evaluated in triage.  Pt complains of chest pain.  Started 2 days ago, it comes and goes and does not radiate.  Feels dull.  Patient has a complicated social situation, history of alcohol abuse.  Was seen 07/18/2021 and prescribed Librium.  Per patient he has not been taking it, his last drink was at 4 loco 4 to 5 hours ago..  Denies SI or HI  Review of Systems  Positive: Chest pain Negative: Shortness of breath  Physical Exam  BP (!) 136/96   Pulse 91   Temp 98.1 F (36.7 C)   Resp 16   SpO2 100%  Gen:   Awake, no distress   Resp:  Normal effort  MSK:   Moves extremities without difficulty  Other:  Bilateral old lacerations consistent with self-harm.  S1-S2, lungs clear to auscultation bilaterally  Medical Decision Making  Medically screening exam initiated at 9:50 PM.  Appropriate orders placed.  [name] was informed that the remainder of the evaluation will be completed by another provider, this initial triage assessment does not replace that evaluation, and the importance of remaining in the ED until their evaluation is complete.  Chest pain work-up.  Gastric was 4 to 6 hours ago, patient does not appear to be in withdrawal.    Theron Arista, PA-C 07/22/21 2151    Ernie Avena, MD 07/22/21 2251

## 2021-08-20 ENCOUNTER — Emergency Department (HOSPITAL_COMMUNITY)
Admission: EM | Admit: 2021-08-20 | Discharge: 2021-08-20 | Disposition: A | Discharge status: Home / Self Care | Payer: Medicare Other | Attending: Emergency Medicine | Admitting: Emergency Medicine

## 2021-08-20 ENCOUNTER — Other Ambulatory Visit: Payer: Self-pay

## 2021-08-20 ENCOUNTER — Encounter (HOSPITAL_COMMUNITY): Payer: Self-pay | Admitting: Emergency Medicine

## 2021-08-20 DIAGNOSIS — J45909 Unspecified asthma, uncomplicated: Secondary | ICD-10-CM | POA: Insufficient documentation

## 2021-08-20 DIAGNOSIS — I10 Essential (primary) hypertension: Secondary | ICD-10-CM | POA: Insufficient documentation

## 2021-08-20 DIAGNOSIS — Z79899 Other long term (current) drug therapy: Secondary | ICD-10-CM | POA: Diagnosis not present

## 2021-08-20 DIAGNOSIS — F10120 Alcohol abuse with intoxication, uncomplicated: Secondary | ICD-10-CM | POA: Diagnosis not present

## 2021-08-20 DIAGNOSIS — F10920 Alcohol use, unspecified with intoxication, uncomplicated: Secondary | ICD-10-CM

## 2021-08-20 DIAGNOSIS — Y908 Blood alcohol level of 240 mg/100 ml or more: Secondary | ICD-10-CM | POA: Diagnosis not present

## 2021-08-20 LAB — COMPREHENSIVE METABOLIC PANEL
ALT: 10 U/L (ref 0–44)
AST: 17 U/L (ref 15–41)
Albumin: 3.8 g/dL (ref 3.5–5.0)
Alkaline Phosphatase: 100 U/L (ref 38–126)
Anion gap: 11 (ref 5–15)
BUN: 7 mg/dL (ref 6–20)
CO2: 27 mmol/L (ref 22–32)
Calcium: 8.2 mg/dL — ABNORMAL LOW (ref 8.9–10.3)
Chloride: 104 mmol/L (ref 98–111)
Creatinine, Ser: 0.64 mg/dL (ref 0.61–1.24)
GFR, Estimated: >60 mL/min (ref >60)
Glucose, Bld: 137 mg/dL — ABNORMAL HIGH (ref 70–99)
Potassium: 3 mmol/L — ABNORMAL LOW (ref 3.5–5.1)
Sodium: 142 mmol/L (ref 135–145)
Total Bilirubin: 0.5 mg/dL (ref 0.3–1.2)
Total Protein: 7.1 g/dL (ref 6.5–8.1)

## 2021-08-20 LAB — CBC
HCT: 41.7 % (ref 39.0–52.0)
Hemoglobin: 13.6 g/dL (ref 13.0–17.0)
MCH: 27.2 pg (ref 26.0–34.0)
MCHC: 32.6 g/dL (ref 30.0–36.0)
MCV: 83.4 fL (ref 80.0–100.0)
Platelets: 266 10*3/uL (ref 150–400)
RBC: 5 MIL/uL (ref 4.22–5.81)
RDW: 14.9 % (ref 11.5–15.5)
WBC: 6 10*3/uL (ref 4.0–10.5)
nRBC: 0 % (ref 0.0–0.2)

## 2021-08-20 LAB — ACETAMINOPHEN LEVEL: Acetaminophen (Tylenol), Serum: <10 ug/mL — ABNORMAL LOW (ref 10–30)

## 2021-08-20 LAB — SALICYLATE LEVEL: Salicylate Lvl: <7 mg/dL — ABNORMAL LOW (ref 7.0–30.0)

## 2021-08-20 LAB — ETHANOL: Alcohol, Ethyl (B): 272 mg/dL — ABNORMAL HIGH (ref <10)

## 2021-08-20 MED ORDER — LORAZEPAM 2 MG/ML IJ SOLN
1.0000 mg | Freq: Once | INTRAMUSCULAR | Status: AC
  Administered 2021-08-20: 08:00:00 1 mg via INTRAVENOUS
  Filled 2021-08-20: qty 1

## 2021-08-20 NOTE — ED Notes (Signed)
Pt provided food and beverage

## 2021-08-20 NOTE — ED Provider Notes (Signed)
[name]   CSN: [id number] Arrival date & time: 08/20/21  [id number]     History Chief Complaint  Patient presents with   Alcohol Intoxication    [name] is a 50 y.o. male with a past medical history significant for alcohol abuse, anxiety, asthma, A. fib not currently on any anticoagulants, bipolar 1 disorder, cocaine abuse, hypertension, and PTSD who presents to the ED due to concerns about possible alcohol withdrawal.  Patient states he last drank roughly 6 hours ago.  He admits to having 4-6 four Lokos within the past 24 hours.  Patient states he has a history of alcohol withdrawal however, denies any documented seizures.  Denies diaphoresis, tremors, and hallucinations.  Denies SI and HI.  Patient states he has been drinking for roughly 6 days straight.  Denies abdominal pain, nausea, vomiting. No treatment prior to arrival. No aggravating or alleviating factors.  Per chart review, patient has been seen numerous times for suspected alcohol withdrawal and alcohol intoxication.  History obtained from patient and past medical records. No interpreter used during encounter.       Past Medical History:  Diagnosis Date   Alcohol dependence with withdrawal ([place])    Anxiety    Asthma    Atrial fibrillation ([place])    not on AC   Bipolar 1 disorder (HCC)    Class 2 obesity    Cocaine abuse ([place])    Depression    Heart palpitations    with severe anxiety   Hypertension    PTSD (post-traumatic stress disorder)    PTSD (post-traumatic stress disorder)     Patient Active Problem List   Diagnosis Date Noted   Malingering 09/19/2020   Alcohol dependence with alcohol-induced mood disorder ([place])    Alcohol withdrawal ([place]) 08/22/2020   Bipolar 1 disorder ([place]) 06/20/2020   MDD (major depressive disorder), recurrent, severe, 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   Cocaine abuse ([place]) 06/14/2020   Class 2 obesity due to excess calories with  body mass index (BMI) of 39.0 to 39.9 in adult 06/14/2020   Alcohol abuse with alcohol-induced mood disorder ([place]) 01/18/2017   Major depressive disorder, recurrent, severe without psychotic features ([place])    Cocaine dependence with cocaine-induced mood disorder ([place])    Alcohol use disorder, severe, dependence ([place]) 04/12/2015   Hypertension 09/08/2011   Post traumatic stress disorder (PTSD) 09/08/2011    Past Surgical History:  Procedure Laterality Date   NO PAST SURGERIES         Family History  Problem Relation Age of Onset   Alcohol abuse Mother    Hypertension Father    Dementia Father    Alcohol abuse Maternal Grandfather     Social History   Tobacco Use   Smoking status: Never   Smokeless tobacco: Never  Vaping Use   Vaping Use: Never used  Substance Use Topics   Alcohol use: Yes   Drug use: Yes    Types: Cocaine    Home Medications Prior to Admission medications   Medication Sig Start Date End Date Taking? Authorizing Provider  amLODipine (NORVASC) 5 MG tablet Take 1 tablet (5 mg total) by mouth daily. 09/05/20   Davonna Belling, MD  chlordiazePOXIDE (LIBRIUM) 25 MG capsule Take 2 capsules by mouth 3 times daily for 1 day , then 1-2 capsules twice daily for 1 day, then 1-2 capsules once daily for 1 day 07/18/21   Regan Lemming, MD  citalopram (CELEXA) 20 MG tablet  Take 1 tablet (20 mg total) by mouth daily. 09/05/20   Davonna Belling, MD  cloNIDine (CATAPRES) 0.2 MG tablet Take 1 tablet (0.2 mg total) by mouth 3 (three) times daily as needed. 09/05/20   Davonna Belling, MD  doxepin (SINEQUAN) 10 MG capsule Take 1 capsule (10 mg total) by mouth at bedtime. 09/05/20   Davonna Belling, MD  hydrOXYzine (ATARAX/VISTARIL) 50 MG tablet Take 1 tablet (50 mg total) by mouth 3 (three) times daily as needed for anxiety. 09/05/20   Davonna Belling, MD  prazosin (MINIPRESS) 1 MG capsule Take 1 capsule (1 mg total) by mouth at bedtime. 09/05/20   Davonna Belling, MD   QUEtiapine (SEROQUEL) 300 MG tablet Take 1 tablet (300 mg total) by mouth at bedtime. 09/05/20   Davonna Belling, MD    Allergies    Metoprolol  Review of Systems   Review of Systems  Respiratory:  Negative for shortness of breath.   Cardiovascular:  Negative for chest pain.  Gastrointestinal:  Negative for abdominal pain, nausea and vomiting.  Psychiatric/Behavioral:  Negative for confusion, hallucinations and suicidal ideas.   All other systems reviewed and are negative.  Physical Exam Updated Vital Signs BP 132/88    Pulse 82    Temp 98.8 F (37.1 C) (Oral)    Resp 16    Ht 5\' 6"  (1.676 m)    Wt 136.1 kg    SpO2 100%    BMI 48.42 kg/m   Physical Exam Vitals and nursing [name] reviewed.  Constitutional:      General: He is not in acute distress.    Appearance: He is not ill-appearing.  HENT:     Head: Normocephalic.  Eyes:     Pupils: Pupils are equal, round, and reactive to light.  Cardiovascular:     Rate and Rhythm: Normal rate and regular rhythm.     Pulses: Normal pulses.     Heart sounds: Normal heart sounds. No murmur heard.   No friction rub. No gallop.  Pulmonary:     Effort: Pulmonary effort is normal.     Breath sounds: Normal breath sounds.  Abdominal:     General: Abdomen is flat. There is no distension.     Palpations: Abdomen is soft.     Tenderness: There is no abdominal tenderness. There is no guarding or rebound.  Musculoskeletal:        General: Normal range of motion.     Cervical back: Neck supple.  Skin:    General: Skin is warm and dry.     Comments: Numerous old scars to bilateral arms.  Neurological:     General: No focal deficit present.     Mental Status: He is alert.  Psychiatric:        Mood and Affect: Mood normal.        Behavior: Behavior normal.        Thought Content: Thought content does not include suicidal ideation. Thought content does not include suicidal plan.     Comments: Tangential speech    ED Results / Procedures  / Treatments   Labs (all labs ordered are listed, but only abnormal results are displayed) Labs Reviewed  COMPREHENSIVE METABOLIC PANEL - Abnormal; Notable for the following components:      Result Value   Potassium 3.0 (*)    Glucose, Bld 137 (*)    Calcium 8.2 (*)    All other components within normal limits  ETHANOL - Abnormal; Notable for the following components:  Alcohol, Ethyl (B) 272 (*)    All other components within normal limits  ACETAMINOPHEN LEVEL - Abnormal; Notable for the following components:   Acetaminophen (Tylenol), Serum <10 (*)    All other components within normal limits  SALICYLATE LEVEL - Abnormal; Notable for the following components:   Salicylate Lvl <7.0 (*)    All other components within normal limits  CBC  RAPID URINE DRUG SCREEN, HOSP PERFORMED    EKG None  Radiology No results found.  Procedures Procedures   Medications Ordered in ED Medications  LORazepam (ATIVAN) injection 1 mg (1 mg Intravenous Given 08/20/21 0744)    ED Course  I have reviewed the triage vital signs and the nursing notes.  Pertinent labs & imaging results that were available during my care of the patient were reviewed by me and considered in my medical decision making (see chart for details).    MDM Rules/Calculators/A&P                         50 year old male presents to the ED due to concerns about possible alcohol withdrawal.  Patient has a history of alcohol abuse and has been seen numerous times for alcohol intoxication and suspected withdrawal.  Upon arrival, patient afebrile, not tachycardic or hypoxic.  Physical exam reassuring.  No tremors.  Patient has some tangential speech and very difficult to obtain HPI.  Patient last drink roughly 6 to 8 hours ago.  Denies SI and HI.  Medical clearance labs ordered.  1 mg Ativan given because it appears patient is more anxious rather than in withdrawal. Patient has no signs of alcohol withdrawal on exam. Will continue  to observe.   Ethanol elevated to 72.  Salicylate and acetaminophen within normal limits.  CMP significant for hypokalemia 3.  CBC unremarkable.  No leukocytosis and normal hemoglobin.  9:49 AM Reassessed patient at beside and he notes he would like to leave. Discussed with patient I would prefer him to metabolize his alcohol a little longer; however he prefers to leave. Denies SI, HI, and auditory/visual hallucinations. Patient ambulating without difficulty. He is AAOx4. I do not have reason to hold him here in the ED against his will. Patient not visibly intoxicated. No evidence of alcohol withdrawal. Advised patient he may return at anytime for additional care. Patient left against medical advice.   Final Clinical Impression(s) / ED Diagnoses Final diagnoses:  Alcoholic intoxication without complication [facility])    Rx / DC Orders ED Discharge Orders     None        Jesusita Oka 08/20/21 1301    Pricilla Loveless, MD 08/20/21 [phone]

## 2021-08-20 NOTE — ED Notes (Signed)
Pt keeps taking his BP cuff off.

## 2021-08-20 NOTE — ED Triage Notes (Signed)
Patient is being drinking alcohol for last 6 days. Patient states that he has not had a drink for 8 hours and is twitching and tachy. Patient states that his momma died.

## 2021-08-20 NOTE — ED Notes (Signed)
Pt have been made aware of urine sample. Urinal in hand 

## 2021-08-21 ENCOUNTER — Encounter (HOSPITAL_COMMUNITY): Payer: Self-pay | Admitting: Emergency Medicine

## 2021-08-21 ENCOUNTER — Other Ambulatory Visit: Payer: Self-pay

## 2021-08-21 ENCOUNTER — Telehealth: Payer: Self-pay | Admitting: Surgery

## 2021-08-21 ENCOUNTER — Emergency Department (HOSPITAL_COMMUNITY)
Admission: EM | Admit: 2021-08-21 | Discharge: 2021-08-21 | Disposition: A | Discharge status: Home / Self Care | Payer: Medicare Other | Attending: Emergency Medicine | Admitting: Emergency Medicine

## 2021-08-21 ENCOUNTER — Other Ambulatory Visit (HOSPITAL_COMMUNITY): Payer: Self-pay

## 2021-08-21 ENCOUNTER — Telehealth (HOSPITAL_COMMUNITY): Payer: Self-pay | Admitting: Emergency Medicine

## 2021-08-21 DIAGNOSIS — F10120 Alcohol abuse with intoxication, uncomplicated: Secondary | ICD-10-CM | POA: Diagnosis present

## 2021-08-21 DIAGNOSIS — I10 Essential (primary) hypertension: Secondary | ICD-10-CM | POA: Insufficient documentation

## 2021-08-21 DIAGNOSIS — Y900 Blood alcohol level of less than 20 mg/100 ml: Secondary | ICD-10-CM | POA: Diagnosis not present

## 2021-08-21 DIAGNOSIS — F10239 Alcohol dependence with withdrawal, unspecified: Secondary | ICD-10-CM | POA: Insufficient documentation

## 2021-08-21 DIAGNOSIS — Z79899 Other long term (current) drug therapy: Secondary | ICD-10-CM | POA: Insufficient documentation

## 2021-08-21 DIAGNOSIS — J45909 Unspecified asthma, uncomplicated: Secondary | ICD-10-CM | POA: Insufficient documentation

## 2021-08-21 DIAGNOSIS — F1010 Alcohol abuse, uncomplicated: Secondary | ICD-10-CM | POA: Diagnosis not present

## 2021-08-21 DIAGNOSIS — F10930 Alcohol use, unspecified with withdrawal, uncomplicated: Secondary | ICD-10-CM

## 2021-08-21 LAB — RAPID URINE DRUG SCREEN, HOSP PERFORMED
Amphetamines: NOT DETECTED
Barbiturates: NOT DETECTED
Benzodiazepines: POSITIVE — AB
Cocaine: NOT DETECTED
Opiates: NOT DETECTED
Tetrahydrocannabinol: NOT DETECTED

## 2021-08-21 LAB — COMPREHENSIVE METABOLIC PANEL
ALT: 12 U/L (ref 0–44)
AST: 18 U/L (ref 15–41)
Albumin: 3.5 g/dL (ref 3.5–5.0)
Alkaline Phosphatase: 104 U/L (ref 38–126)
Anion gap: 10 (ref 5–15)
BUN: <5 mg/dL — ABNORMAL LOW (ref 6–20)
CO2: 26 mmol/L (ref 22–32)
Calcium: 7.4 mg/dL — ABNORMAL LOW (ref 8.9–10.3)
Chloride: 102 mmol/L (ref 98–111)
Creatinine, Ser: 0.66 mg/dL (ref 0.61–1.24)
GFR, Estimated: >60 mL/min (ref >60)
Glucose, Bld: 123 mg/dL — ABNORMAL HIGH (ref 70–99)
Potassium: 3 mmol/L — ABNORMAL LOW (ref 3.5–5.1)
Sodium: 138 mmol/L (ref 135–145)
Total Bilirubin: 0.5 mg/dL (ref 0.3–1.2)
Total Protein: 6.7 g/dL (ref 6.5–8.1)

## 2021-08-21 LAB — CBC
HCT: 39.8 % (ref 39.0–52.0)
Hemoglobin: 12.8 g/dL — ABNORMAL LOW (ref 13.0–17.0)
MCH: 26.8 pg (ref 26.0–34.0)
MCHC: 32.2 g/dL (ref 30.0–36.0)
MCV: 83.4 fL (ref 80.0–100.0)
Platelets: 281 10*3/uL (ref 150–400)
RBC: 4.77 MIL/uL (ref 4.22–5.81)
RDW: 14.6 % (ref 11.5–15.5)
WBC: 5.9 10*3/uL (ref 4.0–10.5)
nRBC: 0 % (ref 0.0–0.2)

## 2021-08-21 LAB — ETHANOL: Alcohol, Ethyl (B): 134 mg/dL — ABNORMAL HIGH (ref <10)

## 2021-08-21 MED ORDER — THIAMINE HCL 100 MG PO TABS
100.0000 mg | ORAL_TABLET | Freq: Every day | ORAL | Status: DC
  Administered 2021-08-21: 10:00:00 100 mg via ORAL
  Filled 2021-08-21: qty 1

## 2021-08-21 MED ORDER — LORAZEPAM 2 MG/ML IJ SOLN: 0.0000 mg | Freq: Four times a day (QID) | INTRAMUSCULAR | Status: DC

## 2021-08-21 MED ORDER — LORAZEPAM 2 MG/ML IJ SOLN: 0.0000 mg | Freq: Two times a day (BID) | INTRAMUSCULAR | Status: DC

## 2021-08-21 MED ORDER — CHLORDIAZEPOXIDE HCL 25 MG PO CAPS: ORAL_CAPSULE | ORAL | 0 refills | Status: DC

## 2021-08-21 MED ORDER — CHLORDIAZEPOXIDE HCL 25 MG PO CAPS
ORAL_CAPSULE | ORAL | 0 refills | Status: DC
  Filled 2021-08-21: qty 10, 3d supply, fill #0

## 2021-08-21 MED ORDER — LORAZEPAM 1 MG PO TABS
0.0000 mg | ORAL_TABLET | Freq: Four times a day (QID) | ORAL | Status: DC
  Administered 2021-08-21: 10:00:00 2 mg via ORAL
  Filled 2021-08-21: qty 2

## 2021-08-21 MED ORDER — GABAPENTIN 300 MG PO CAPS
300.0000 mg | ORAL_CAPSULE | ORAL | Status: AC
  Administered 2021-08-21: 12:00:00 300 mg via ORAL
  Filled 2021-08-21: qty 1

## 2021-08-21 MED ORDER — THIAMINE HCL 100 MG/ML IJ SOLN: 100.0000 mg | Freq: Every day | INTRAMUSCULAR | Status: DC

## 2021-08-21 MED ORDER — POTASSIUM CHLORIDE CRYS ER 20 MEQ PO TBCR
40.0000 meq | EXTENDED_RELEASE_TABLET | Freq: Once | ORAL | Status: AC
  Administered 2021-08-21: 11:00:00 40 meq via ORAL
  Filled 2021-08-21: qty 2

## 2021-08-21 MED ORDER — SODIUM CHLORIDE 0.9 % IV BOLUS
1000.0000 mL | Freq: Once | INTRAVENOUS | Status: AC
  Administered 2021-08-21: 11:00:00 1000 mL via INTRAVENOUS

## 2021-08-21 MED ORDER — LORAZEPAM 1 MG PO TABS: 0.0000 mg | ORAL_TABLET | Freq: Two times a day (BID) | ORAL | Status: DC

## 2021-08-21 MED ORDER — FOLIC ACID 1 MG PO TABS
1.0000 mg | ORAL_TABLET | Freq: Every day | ORAL | Status: DC
  Administered 2021-08-21: 11:00:00 1 mg via ORAL
  Filled 2021-08-21: qty 1

## 2021-08-21 MED ORDER — CHLORDIAZEPOXIDE HCL 25 MG PO CAPS
50.0000 mg | ORAL_CAPSULE | Freq: Once | ORAL | Status: AC
  Administered 2021-08-21: 11:00:00 50 mg via ORAL
  Filled 2021-08-21: qty 2

## 2021-08-21 NOTE — ED Provider Notes (Signed)
[facility] EMERGENCY DEPARTMENT Provider Note   CSN: [id number] Arrival date & time: 08/21/21  [id number]     History Chief Complaint  Patient presents with   Alcohol Intoxication    [name] is a 50 y.o. male.  HPI Patient drinks between 5-7 4-Loco's  per day when he binge drinks.  He states that he binge drinks multiple episodes per month.  He states that starting 4 days ago he started drinking heavily.  States that his last drink was approximately 8 or 9 PM last night.  He states that he woke up feeling very shaky and anxious.  Came to the ER for evaluation.  He denies any recreational drug use besides alcohol.   Denies any SI, HI, AVH.  Denies any history of delirium tremens or seizures. No CP, SOB, LH, vertigo vomiting or diarrhea fever or chills. Some nausea.      Past Medical History:  Diagnosis Date   Alcohol dependence with withdrawal ([place])    Anxiety    Asthma    Atrial fibrillation ([place])    not on AC   Bipolar 1 disorder (HCC)    Class 2 obesity    Cocaine abuse ([place])    Depression    Heart palpitations    with severe anxiety   Hypertension    PTSD (post-traumatic stress disorder)    PTSD (post-traumatic stress disorder)     Patient Active Problem List   Diagnosis Date Noted   Malingering 09/19/2020   Alcohol dependence with alcohol-induced mood disorder ([place])    Alcohol withdrawal ([place]) 08/22/2020   Bipolar 1 disorder ([place]) 06/20/2020   MDD (major depressive disorder), recurrent, severe, with psychosis ([place]) 06/20/2020   Cocaine abuse ([place]) 06/14/2020   Class 2 obesity due to excess calories with body mass index (BMI) of 39.0 to 39.9 in adult 06/14/2020   Alcohol abuse with alcohol-induced mood disorder ([place]) 01/18/2017   Major depressive disorder, recurrent, severe without psychotic features ([place])    Cocaine dependence with cocaine-induced mood disorder ([place])    Alcohol use disorder, severe, dependence ([place]) 04/12/2015    Hypertension 09/08/2011   Post traumatic stress disorder (PTSD) 09/08/2011    Past Surgical History:  Procedure Laterality Date   NO PAST SURGERIES         Family History  Problem Relation Age of Onset   Alcohol abuse Mother    Hypertension Father    Dementia Father    Alcohol abuse Maternal Grandfather     Social History   Tobacco Use   Smoking status: Never   Smokeless tobacco: Never  Vaping Use   Vaping Use: Never used  Substance Use Topics   Alcohol use: Yes   Drug use: Yes    Types: Cocaine    Home Medications Prior to Admission medications   Medication Sig Start Date End Date Taking? Authorizing Provider  amLODipine (NORVASC) 5 MG tablet Take 1 tablet (5 mg total) by mouth daily. 09/05/20  Yes Davonna Belling, MD  clonazePAM (KLONOPIN) 0.5 MG tablet Take 0.5 mg by mouth 2 (two) times daily as needed for anxiety (panic attacks).   Yes [provider]  doxepin (SINEQUAN) 10 MG capsule Take 1 capsule (10 mg total) by mouth at bedtime. 09/05/20  Yes Davonna Belling, MD  chlordiazePOXIDE (LIBRIUM) 25 MG capsule 50mg  PO TID x 1D, then 25-50mg  PO BID X 1D, then 25-50mg  PO QD X 1D 08/21/21   Wynona Dove A, DO  citalopram (CELEXA) 20 MG  tablet Take 1 tablet (20 mg total) by mouth daily. Patient not taking: Reported on 08/21/2021 09/05/20   Davonna Belling, MD  cloNIDine (CATAPRES) 0.2 MG tablet Take 1 tablet (0.2 mg total) by mouth 3 (three) times daily as needed. Patient not taking: Reported on 08/21/2021 09/05/20   Davonna Belling, MD  hydrOXYzine (ATARAX/VISTARIL) 50 MG tablet Take 1 tablet (50 mg total) by mouth 3 (three) times daily as needed for anxiety. Patient not taking: Reported on 08/21/2021 09/05/20   Davonna Belling, MD  prazosin (MINIPRESS) 1 MG capsule Take 1 capsule (1 mg total) by mouth at bedtime. Patient not taking: Reported on 08/21/2021 09/05/20   Davonna Belling, MD  QUEtiapine (SEROQUEL) 300 MG tablet Take 1 tablet (300 mg total) by mouth  at bedtime. Patient not taking: Reported on 08/21/2021 09/05/20   Davonna Belling, MD    Allergies    Metoprolol  Review of Systems   Review of Systems  Constitutional:  Negative for chills and fever.  HENT:  Negative for congestion.   Eyes:  Negative for pain.  Respiratory:  Negative for cough and shortness of breath.   Cardiovascular:  Negative for chest pain and leg swelling.  Gastrointestinal:  Positive for nausea. Negative for abdominal pain and vomiting.  Genitourinary:  Negative for dysuria.  Musculoskeletal:  Negative for myalgias.  Skin:  Negative for rash.  Neurological:  Negative for dizziness and headaches.       Tremors, anxious   Physical Exam Updated Vital Signs BP (!) 139/105 (BP Location: Left Arm)    Pulse (!) 102    Temp 98 F (36.7 C) (Oral)    Resp 15    Ht 5\' 6"  (1.676 m)    Wt 112.5 kg    SpO2 97%    BMI 40.03 kg/m   Physical Exam Vitals and nursing note reviewed.  Constitutional:      Comments: Tremulous 51 year old male  HENT:     Head: Normocephalic and atraumatic.     Nose: Nose normal.     Mouth/Throat:     Mouth: Mucous membranes are moist.  Eyes:     General: No scleral icterus. Cardiovascular:     Rate and Rhythm: Normal rate and regular rhythm.     Pulses: Normal pulses.     Heart sounds: Normal heart sounds.  Pulmonary:     Effort: Pulmonary effort is normal. No respiratory distress.     Breath sounds: No wheezing.  Abdominal:     Palpations: Abdomen is soft.     Tenderness: There is no abdominal tenderness.  Musculoskeletal:     Cervical back: Normal range of motion.     Right lower leg: No edema.     Left lower leg: No edema.  Skin:    General: Skin is warm and dry.     Capillary Refill: Capillary refill takes less than 2 seconds.     Comments: Numerous keloid-esque old well healed scars to body (primarily arms). No acute lacerations/cuts on arms  Neurological:     Mental Status: He is alert. Mental status is at baseline.      Comments: Alert and oriented to self, place, time and event.   Speech is fluent, clear without dysarthria or dysphasia.   Strength 5/5 in upper/lower extremities   Sensation intact in upper/lower extremities   Tremulous  CN I not tested  CN II grossly intact visual fields bilaterally. Did not visualize posterior eye.  CN III, IV, VI PERRLA and  EOMs intact bilaterally  CN V Intact sensation to sharp and light touch to the face  CN VII facial movements symmetric  CN VIII not tested  CN IX, X no uvula deviation, symmetric rise of soft palate  CN XI 5/5 SCM and trapezius strength bilaterally  CN XII Midline tongue protrusion, symmetric L/R movements     Psychiatric:        Behavior: Behavior normal.     Comments: Mildly anxious, normal behavior, very pleasant    ED Results / Procedures / Treatments   Labs (all labs ordered are listed, but only abnormal results are displayed) Labs Reviewed  COMPREHENSIVE METABOLIC PANEL - Abnormal; Notable for the following components:      Result Value   Potassium 3.0 (*)    Glucose, Bld 123 (*)    BUN <5 (*)    Calcium 7.4 (*)    All other components within normal limits  ETHANOL - Abnormal; Notable for the following components:   Alcohol, Ethyl (B) 134 (*)    All other components within normal limits  CBC - Abnormal; Notable for the following components:   Hemoglobin 12.8 (*)    All other components within normal limits  RAPID URINE DRUG SCREEN, HOSP PERFORMED - Abnormal; Notable for the following components:   Benzodiazepines POSITIVE (*)    All other components within normal limits    EKG None  Radiology No results found.  Procedures Procedures   Medications Ordered in ED Medications  chlordiazePOXIDE (LIBRIUM) capsule 50 mg (50 mg Oral Given 08/21/21 1055)  sodium chloride 0.9 % bolus 1,000 mL (0 mLs Intravenous Stopped 08/21/21 1208)  potassium chloride SA (KLOR-CON M) CR tablet 40 mEq (40 mEq Oral Given 08/21/21 1056)   gabapentin (NEURONTIN) capsule 300 mg (300 mg Oral Given 08/21/21 1204)    ED Course  I have reviewed the triage vital signs and the nursing notes.  Pertinent labs & imaging results that were available during my care of the patient were reviewed by me and considered in my medical decision making (see chart for details).  Clinical Course as of 08/24/21 1401  Wed Aug 21, 2021  1158 Heart rate currently 85.  Patient feels much improved.  No longer tremulous.  Moves all 4 extremities without difficulty.  States he would like to be discharged home which I think is reasonable.  He denies any SI HI AVH.  Will discharge home with Librium taper.  We will provide him with 1 dose of gabapentin prior to discharge he is already received Ativan, 1 L of fluids, folic acid and thiamine. [WF]  1159 Lengthy discussion about alcohol cessation. [WF]    Clinical Course User Index [WF] Gailen Shelter, PA   MDM Rules/Calculators/A&P                          Pt here w mild withdraw no hx of severe wd. He is tremulous and tachycardic. Will provide ativan and IVF, given k+ for hypokalemia, librium 50mg  PO and IV ativan. Significant improvement in sx.  I personally reviewed all laboratory work and imaging.  Metabolic panel without any acute abnormality specifically kidney function within normal limits and no significant electrolyte abnormalities aside from K+. ETOH initially elevated. May have already metabolized significantly in waiting room. UDS + for benzos likely from here.  CBC without leukocytosis or significant anemia.    Re-evaluated. Feels much better will give one more dose of medicine here before  DC.  Gabapentin 300mg  given at DC. Will follow up with PCP and was given DC paperwork listing mental health/substance use resources.   Patient changed mind several times about preferred pharmacy. Sent to preferred pharmacy. Pt ambulatory. Tachycardia resolved. HR 85 at DC and walking without difficulty.  Comfortable with discharge home and reiterates no SI/HI/AVH.  Final Clinical Impression(s) / ED Diagnoses Final diagnoses:  ETOH abuse  Alcohol withdrawal syndrome without complication ([place])    Rx / DC Orders ED Discharge Orders          Ordered    chlordiazePOXIDE (LIBRIUM) 25 MG capsule  Status:  Discontinued        08/21/21 1238    chlordiazePOXIDE (LIBRIUM) 25 MG capsule  Status:  Discontinued        08/21/21 1241    chlordiazePOXIDE (LIBRIUM) 25 MG capsule  Status:  Discontinued        08/21/21 [place], Everlynn Sagun S, [place] 08/24/21 Porter, Ankit, MD 08/24/21 1536

## 2021-08-21 NOTE — Telephone Encounter (Signed)
Pt called, wanted meds sent to different pharmacy, does not have access to transportation. Librium cancelled at original pharmacy, sent to closer pharmacy to patient. D/w CM

## 2021-08-21 NOTE — Care Management (Signed)
°  MATCH Medication Assistance Card Name: Ashaz Robling ID (MRN): [id number] Bin: [id number] RX Group: [id number] Discharge Date: 08/21/2021 Expiration Date: 09/12/2021                                           (must be filled within 7 days of discharge)      Dear   : Beverely Low  You have been approved to have the prescriptions written by your discharging physician filled through our [facility] (Medication Assistance Through [facility]) program. This program allows for a one-time (no refills) 34-day supply of selected medications for a low copay amount.  The copay is .$00 per prescription. For instance, if you have one prescription, you will pay $3.00; for two prescriptions, you pay $6.00; for three prescriptions, you pay $9.00; and so on.  Only certain pharmacies are participating in this program with [facility]. You will need to select one of the pharmacies from the attached list and take your prescriptions, this letter, and your photo ID to one of the participating pharmacies.   We are excited that you are able to use the [facility] program to get your medications. These prescriptions must be filled within 7 days of hospital discharge or they will no longer be valid for the [facility] program. Should you have any problems with your prescriptions please contact your case management team member at [phone] for [place]/ [facility].  Thank you, [facility] Health Care Management

## 2021-08-21 NOTE — ED Triage Notes (Signed)
Pt arrived with EMS for ETOH detox; seen yesterday at [facility] and left AMA. Last drink yesterday   EMS VS 172/98, pulse 98, resp 24, 97% RA, CBG 173

## 2021-08-21 NOTE — ED Notes (Signed)
Pt reports last drink was last night he had 2 four loco's, in a 24 hr period he reports drinking 10-12 four loco's. Prior to this pt reports a 10 month period of sobriety that was triggered by the holidays. Pt is pleasant at the bedside but reports high levels of anxiety and has tremors in upper extremities. Pt reports seeing black dots in his vision

## 2021-08-21 NOTE — Telephone Encounter (Signed)
ED RNCM received call from patient concerning prescription [facility] [place]. [facility] [address]  ED RNCM discussed with Dr. Durwin Nora EDP, new  prescription

## 2021-08-21 NOTE — ED Triage Notes (Signed)
Pt reports being here for alcohol withdrawals. States his last drink was 6-8 hours ago.

## 2021-08-21 NOTE — Discharge Instructions (Addendum)
Start taking the Librium as prescribed.  Please stop drinking alcohol.  I have provided you with some resources for alcohol and substance use disorder.  I have sent your prescriptions to the [facility] outpatient pharmacy.

## 2021-08-29 ENCOUNTER — Encounter (HOSPITAL_COMMUNITY): Payer: Self-pay | Admitting: Emergency Medicine

## 2021-08-29 ENCOUNTER — Emergency Department (HOSPITAL_COMMUNITY)
Admission: EM | Admit: 2021-08-29 | Discharge: 2021-08-30 | Disposition: A | Discharge status: Home / Self Care | Payer: Medicare Other | Source: Home / Self Care | Attending: Emergency Medicine | Admitting: Emergency Medicine

## 2021-08-29 ENCOUNTER — Encounter (HOSPITAL_COMMUNITY): Payer: Self-pay

## 2021-08-29 ENCOUNTER — Emergency Department (HOSPITAL_COMMUNITY)
Admission: EM | Admit: 2021-08-29 | Discharge: 2021-08-29 | Disposition: A | Discharge status: Home / Self Care | Payer: Medicare Other | Attending: Emergency Medicine | Admitting: Emergency Medicine

## 2021-08-29 ENCOUNTER — Other Ambulatory Visit: Payer: Self-pay

## 2021-08-29 DIAGNOSIS — J45909 Unspecified asthma, uncomplicated: Secondary | ICD-10-CM | POA: Insufficient documentation

## 2021-08-29 DIAGNOSIS — Z79899 Other long term (current) drug therapy: Secondary | ICD-10-CM | POA: Diagnosis not present

## 2021-08-29 DIAGNOSIS — Y903 Blood alcohol level of 60-79 mg/100 ml: Secondary | ICD-10-CM | POA: Diagnosis not present

## 2021-08-29 DIAGNOSIS — F10930 Alcohol use, unspecified with withdrawal, uncomplicated: Secondary | ICD-10-CM

## 2021-08-29 DIAGNOSIS — F10230 Alcohol dependence with withdrawal, uncomplicated: Secondary | ICD-10-CM | POA: Diagnosis not present

## 2021-08-29 DIAGNOSIS — Y900 Blood alcohol level of less than 20 mg/100 ml: Secondary | ICD-10-CM | POA: Insufficient documentation

## 2021-08-29 DIAGNOSIS — R1110 Vomiting, unspecified: Secondary | ICD-10-CM | POA: Diagnosis present

## 2021-08-29 DIAGNOSIS — Z20822 Contact with and (suspected) exposure to covid-19: Secondary | ICD-10-CM | POA: Diagnosis not present

## 2021-08-29 DIAGNOSIS — F1010 Alcohol abuse, uncomplicated: Secondary | ICD-10-CM

## 2021-08-29 DIAGNOSIS — I10 Essential (primary) hypertension: Secondary | ICD-10-CM | POA: Insufficient documentation

## 2021-08-29 LAB — CBC WITH DIFFERENTIAL/PLATELET
Abs Immature Granulocytes: 0.03 10*3/uL (ref 0.00–0.07)
Basophils Absolute: 0 10*3/uL (ref 0.0–0.1)
Basophils Relative: 1 %
Eosinophils Absolute: 0 10*3/uL (ref 0.0–0.5)
Eosinophils Relative: 1 %
HCT: 41.8 % (ref 39.0–52.0)
Hemoglobin: 13.2 g/dL (ref 13.0–17.0)
Immature Granulocytes: 1 %
Lymphocytes Relative: 25 %
Lymphs Abs: 1.6 10*3/uL (ref 0.7–4.0)
MCH: 26.8 pg (ref 26.0–34.0)
MCHC: 31.6 g/dL (ref 30.0–36.0)
MCV: 85 fL (ref 80.0–100.0)
Monocytes Absolute: 0.5 10*3/uL (ref 0.1–1.0)
Monocytes Relative: 8 %
Neutro Abs: 4.1 10*3/uL (ref 1.7–7.7)
Neutrophils Relative %: 64 %
Platelets: 258 10*3/uL (ref 150–400)
RBC: 4.92 MIL/uL (ref 4.22–5.81)
RDW: 15.2 % (ref 11.5–15.5)
WBC: 6.3 10*3/uL (ref 4.0–10.5)
nRBC: 0 % (ref 0.0–0.2)

## 2021-08-29 LAB — URINALYSIS, ROUTINE W REFLEX MICROSCOPIC
Bacteria, UA: NONE SEEN
Bilirubin Urine: NEGATIVE
Glucose, UA: NEGATIVE mg/dL
Ketones, ur: NEGATIVE mg/dL
Leukocytes,Ua: NEGATIVE
Nitrite: NEGATIVE
Protein, ur: NEGATIVE mg/dL
Specific Gravity, Urine: 1.004 — ABNORMAL LOW (ref 1.005–1.030)
pH: 7 (ref 5.0–8.0)

## 2021-08-29 LAB — COMPREHENSIVE METABOLIC PANEL
ALT: 14 U/L (ref 0–44)
AST: 25 U/L (ref 15–41)
Albumin: 3.8 g/dL (ref 3.5–5.0)
Alkaline Phosphatase: 115 U/L (ref 38–126)
Anion gap: 13 (ref 5–15)
BUN: <5 mg/dL — ABNORMAL LOW (ref 6–20)
CO2: 20 mmol/L — ABNORMAL LOW (ref 22–32)
Calcium: 7.7 mg/dL — ABNORMAL LOW (ref 8.9–10.3)
Chloride: 102 mmol/L (ref 98–111)
Creatinine, Ser: 0.71 mg/dL (ref 0.61–1.24)
GFR, Estimated: >60 mL/min (ref >60)
Glucose, Bld: 93 mg/dL (ref 70–99)
Potassium: 3.1 mmol/L — ABNORMAL LOW (ref 3.5–5.1)
Sodium: 135 mmol/L (ref 135–145)
Total Bilirubin: 0.8 mg/dL (ref 0.3–1.2)
Total Protein: 7 g/dL (ref 6.5–8.1)

## 2021-08-29 LAB — RAPID URINE DRUG SCREEN, HOSP PERFORMED
Amphetamines: NOT DETECTED
Barbiturates: NOT DETECTED
Benzodiazepines: POSITIVE — AB
Cocaine: POSITIVE — AB
Opiates: NOT DETECTED
Tetrahydrocannabinol: NOT DETECTED

## 2021-08-29 LAB — RESP PANEL BY RT-PCR (FLU A&B, COVID) ARPGX2
Influenza A by PCR: NEGATIVE
Influenza B by PCR: NEGATIVE
SARS Coronavirus 2 by RT PCR: NEGATIVE

## 2021-08-29 LAB — CBG MONITORING, ED: Glucose-Capillary: 101 mg/dL — ABNORMAL HIGH (ref 70–99)

## 2021-08-29 LAB — POC OCCULT BLOOD, ED: Fecal Occult Bld: NEGATIVE

## 2021-08-29 LAB — SALICYLATE LEVEL: Salicylate Lvl: <7 mg/dL — ABNORMAL LOW (ref 7.0–30.0)

## 2021-08-29 LAB — MAGNESIUM: Magnesium: 1.9 mg/dL (ref 1.7–2.4)

## 2021-08-29 LAB — ACETAMINOPHEN LEVEL: Acetaminophen (Tylenol), Serum: <10 ug/mL — ABNORMAL LOW (ref 10–30)

## 2021-08-29 LAB — ETHANOL: Alcohol, Ethyl (B): 67 mg/dL — ABNORMAL HIGH (ref <10)

## 2021-08-29 MED ORDER — POTASSIUM CHLORIDE CRYS ER 20 MEQ PO TBCR
40.0000 meq | EXTENDED_RELEASE_TABLET | Freq: Once | ORAL | Status: AC
  Administered 2021-08-29: 12:00:00 40 meq via ORAL
  Filled 2021-08-29: qty 2

## 2021-08-29 MED ORDER — LORAZEPAM 2 MG/ML IJ SOLN: 0.0000 mg | Freq: Four times a day (QID) | INTRAMUSCULAR | Status: DC

## 2021-08-29 MED ORDER — CHLORDIAZEPOXIDE HCL 25 MG PO CAPS: ORAL_CAPSULE | ORAL | 0 refills | Status: DC

## 2021-08-29 MED ORDER — LORAZEPAM 1 MG PO TABS: 0.0000 mg | ORAL_TABLET | Freq: Two times a day (BID) | ORAL | Status: DC

## 2021-08-29 MED ORDER — LORAZEPAM 2 MG/ML IJ SOLN: 0.0000 mg | Freq: Two times a day (BID) | INTRAMUSCULAR | Status: DC

## 2021-08-29 MED ORDER — THIAMINE HCL 100 MG/ML IJ SOLN: 100.0000 mg | Freq: Every day | INTRAMUSCULAR | Status: DC

## 2021-08-29 MED ORDER — THIAMINE HCL 100 MG PO TABS
100.0000 mg | ORAL_TABLET | Freq: Every day | ORAL | Status: DC
  Administered 2021-08-29: 10:00:00 100 mg via ORAL
  Filled 2021-08-29: qty 1

## 2021-08-29 MED ORDER — LORAZEPAM 1 MG PO TABS
0.0000 mg | ORAL_TABLET | Freq: Four times a day (QID) | ORAL | Status: DC
Start: 2021-08-29 — End: 2021-08-29
  Administered 2021-08-29: 14:00:00 1 mg via ORAL
  Administered 2021-08-29 (×2): 2 mg via ORAL
  Filled 2021-08-29 (×2): qty 2
  Filled 2021-08-29: qty 1

## 2021-08-29 NOTE — ED Triage Notes (Signed)
Per EMS- Patient was walking to a [facility] to get a Rx for chlordiazepoxide. Patient went to the wrong [facility] and was trying to walk to the correct one when he called EMS for SOB and alcohol withdrawal symptoms. Last drink was 9 hours ago.

## 2021-08-29 NOTE — ED Provider Notes (Signed)
Emergency Medicine Provider Triage Evaluation Note  [name] , a 50 y.o. male  was evaluated in triage.  Pt complains of alcohol withdrawal.  Was seen at [facility] earlier today and given prescription for Librium but states he was unable to locate the correct [facility] before they closed.  Was ambulating past to use a point where he entered he felt he was having an exacerbation of his PTSD.  Was nauseous, tremulous, feeling very anxious and diaphoretic.  EMS was called and he was brought to [place] along with concern for acute alcohol withdrawal.  Review of Systems  Positive: Nausea, tremulous, anxious, diaphoretic Negative: Chest pain or shortness of breath and palpitation  Physical Exam  SpO2 97%  Gen:   Awake,  anxious Resp:  Normal effort tachypneic MSK:   Moves extremities without difficulty  Other:  RRR no M/R/G.  Lungs CTA B.  Medical Decision Making  Medically screening exam initiated at 7:24 PM.  Appropriate orders placed.  [name] was informed that the remainder of the evaluation will be completed by another provider, this initial triage assessment does not replace that evaluation, and the importance of remaining in the ED until their evaluation is complete.  Patient is acutely tremulous, concern for possible alcohol withdrawal.  Orders placed.  Patient to be given priority for room.  No history of DTs.  RN aware.  This chart was dictated using voice recognition software, Dragon. Despite the best efforts of this provider to proofread and correct errors, errors may still occur which can change documentation meaning.    Sherrilee Gilles 08/29/21 Dara Lords, MD 08/29/21 [phone]

## 2021-08-29 NOTE — ED Provider Notes (Signed)
Emergency Medicine Provider Triage Evaluation Note  [name] , a 50 y.o. male  was evaluated in triage.  Pt complains of alcohol withdrawal.  He states that he has been in withdrawal before, and this feels similar.  He reports having prior alcohol withdrawal seizures.  He states that his last drink was approximately 4 hours ago.  He states that he has been drinking heavily for the past 6 days.  He reports feeling shaky and seeing black spots.  Review of Systems  Positive: Anxious, nausea Negative: Vomiting, seizure  Physical Exam  BP (!) 140/96 (BP Location: Right Arm)    Pulse 99    Temp 98.2 F (36.8 C) (Oral)    Resp 16    SpO2 98%  Gen:   Awake, tremulous Resp:  Normal effort  MSK:   Moves extremities without difficulty  Other:    Medical Decision Making  Medically screening exam initiated at 1:57 AM.  Appropriate orders placed.  [name] was informed that the remainder of the evaluation will be completed by another provider, this initial triage assessment does not replace that evaluation, and the importance of remaining in the ED until their evaluation is complete.  Concern for ETOH withdrawal   [name], PA-C 08/29/21 0158    Maia Plan, MD 08/29/21 [phone]

## 2021-08-29 NOTE — ED Triage Notes (Signed)
Patient requesting detox for his alcoholism , last drink last night , no SI or HI .

## 2021-08-29 NOTE — Discharge Instructions (Addendum)
Do not drink alcohol if you are taking the Librium taper. Doing this can cause you to stop breathing and you can die.  Please only take the Librium if you are actually ready to stop drinking.  Today you received medications that may make you sleepy or impair your ability to make decisions.  For the next 24 hours please do not drive, operate heavy machinery, care for a small child with out another adult present, or perform any activities that may cause harm to you or someone else if you were to fall asleep or be impaired.   You are being prescribed a medication which may make you sleepy. Please follow up of listed precautions for at least 24 hours after taking one dose.

## 2021-08-29 NOTE — ED Provider Notes (Signed)
[facility] EMERGENCY DEPARTMENT Provider Note   CSN: [id number] Arrival date & time: 08/29/21  0113     History Chief Complaint  Patient presents with   Alcohol Problem    [name] is a 50 y.o. male with a past medical history of alcohol dependence with withdrawal, A. fib not on anticoagulation, cocaine abuse, hypertension, PTSD, depression, who presents today for evaluation of alcohol withdrawal.  He states that he has been drinking heavily for the past 6 days.  He was seen here 8 days ago and given a Librium taper which he states he took.  He reports that he has been drinking very heavily and vomiting multiple times.  He has noticed a few red streaks in his vomit over the past 2 days.  He states that he does want to stop drinking. He attributes his drinking to stress from legal issues. No SI, HI, or avh.   HPI     Past Medical History:  Diagnosis Date   Alcohol dependence with withdrawal ([place])    Anxiety    Asthma    Atrial fibrillation ([place])    not on AC   Bipolar 1 disorder (HCC)    Class 2 obesity    Cocaine abuse ([place])    Depression    Heart palpitations    with severe anxiety   Hypertension    PTSD (post-traumatic stress disorder)    PTSD (post-traumatic stress disorder)     Patient Active Problem List   Diagnosis Date Noted   Malingering 09/19/2020   Alcohol dependence with alcohol-induced mood disorder ([place])    Alcohol withdrawal ([place]) 08/22/2020   Bipolar 1 disorder ([place]) 06/20/2020   MDD (major depressive disorder), recurrent, severe, with psychosis ([place]) 06/20/2020   Cocaine abuse ([place]) 06/14/2020   Class 2 obesity due to excess calories with body mass index (BMI) of 39.0 to 39.9 in adult 06/14/2020   Alcohol abuse with alcohol-induced mood disorder ([place]) 01/18/2017   Major depressive disorder, recurrent, severe without psychotic features ([place])    Cocaine dependence with cocaine-induced mood disorder ([name])    Alcohol use  disorder, severe, dependence ([place]) 04/12/2015   Hypertension 09/08/2011   Post traumatic stress disorder (PTSD) 09/08/2011    Past Surgical History:  Procedure Laterality Date   NO PAST SURGERIES         Family History  Problem Relation Age of Onset   Alcohol abuse Mother    Hypertension Father    Dementia Father    Alcohol abuse Maternal Grandfather     Social History   Tobacco Use   Smoking status: Never   Smokeless tobacco: Never  Vaping Use   Vaping Use: Never used  Substance Use Topics   Alcohol use: Yes   Drug use: Yes    Types: Cocaine    Home Medications Prior to Admission medications   Medication Sig Start Date End Date Taking? Authorizing Provider  albuterol (VENTOLIN HFA) 108 (90 Base) MCG/ACT inhaler Inhale 2 puffs into the lungs every 6 (six) hours as needed for wheezing or shortness of breath.   Yes [provider]  amLODipine (NORVASC) 5 MG tablet Take 1 tablet (5 mg total) by mouth daily. 09/05/20  Yes Davonna Belling, MD  chlordiazePOXIDE (LIBRIUM) 25 MG capsule 50mg  PO TID x 1D, then 25-50mg  PO BID X 1D, then 25-50mg  PO QD X 1D 08/29/21  Yes Lorin Glass, PA-C  clonazePAM (KLONOPIN) 0.5 MG tablet Take 0.5 mg by mouth 2 (two)  times daily as needed for anxiety (panic attacks).   Yes [provider]  doxepin (SINEQUAN) 10 MG capsule Take 1 capsule (10 mg total) by mouth at bedtime. 09/05/20  Yes Benjiman Core, MD  citalopram (CELEXA) 20 MG tablet Take 1 tablet (20 mg total) by mouth daily. Patient not taking: Reported on 08/21/2021 09/05/20   Benjiman Core, MD  cloNIDine (CATAPRES) 0.2 MG tablet Take 1 tablet (0.2 mg total) by mouth 3 (three) times daily as needed. Patient not taking: Reported on 08/21/2021 09/05/20   Benjiman Core, MD  hydrOXYzine (ATARAX/VISTARIL) 50 MG tablet Take 1 tablet (50 mg total) by mouth 3 (three) times daily as needed for anxiety. Patient not taking: Reported on 08/21/2021 09/05/20   Benjiman Core, MD  prazosin (MINIPRESS) 1 MG capsule Take 1 capsule (1 mg total) by mouth at bedtime. Patient not taking: Reported on 08/21/2021 09/05/20   Benjiman Core, MD  QUEtiapine (SEROQUEL) 300 MG tablet Take 1 tablet (300 mg total) by mouth at bedtime. Patient not taking: Reported on 08/21/2021 09/05/20   Benjiman Core, MD    Allergies    Metoprolol  Review of Systems   Review of Systems  Constitutional:  Negative for chills and fever.  HENT:  Negative for congestion.   Eyes:  Negative for visual disturbance.  Respiratory:  Negative for cough and shortness of breath.   Cardiovascular:  Negative for chest pain.  Gastrointestinal:  Positive for nausea and vomiting. Negative for abdominal pain.  Genitourinary:  Positive for dysuria. Negative for penile discharge, penile pain and penile swelling.  Musculoskeletal:  Negative for back pain and neck pain.  Skin:  Negative for color change and rash.  Neurological:  Positive for tremors. Negative for weakness and headaches.  Psychiatric/Behavioral:  Negative for confusion.   All other systems reviewed and are negative.  Physical Exam Updated Vital Signs BP 122/88 (BP Location: Right Arm)    Pulse 91    Temp 98.2 F (36.8 C) (Oral)    Resp 18    SpO2 97%   Physical Exam Vitals and nursing note reviewed. Exam conducted with a chaperone present.  Constitutional:      General: He is not in acute distress.    Appearance: He is not diaphoretic.  HENT:     Head: Normocephalic and atraumatic.  Eyes:     General: No scleral icterus.       Right eye: No discharge.        Left eye: No discharge.     Conjunctiva/sclera: Conjunctivae normal.  Cardiovascular:     Rate and Rhythm: Normal rate and regular rhythm.  Pulmonary:     Effort: Pulmonary effort is normal. No respiratory distress.     Breath sounds: No stridor.  Abdominal:     General: There is no distension.  Genitourinary:    Comments: Scant hard stool in the rectal vault.   Musculoskeletal:        General: No deformity.     Cervical back: Normal range of motion.  Skin:    General: Skin is warm and dry.  Neurological:     Mental Status: He is alert.     Motor: No abnormal muscle tone.     Comments: Patient is awake and alert.  His speech is not slurred.  He does have tremors diffusely.    Psychiatric:        Behavior: Behavior normal.    ED Results / Procedures / Treatments   Labs (all labs  ordered are listed, but only abnormal results are displayed) Labs Reviewed  COMPREHENSIVE METABOLIC PANEL - Abnormal; Notable for the following components:      Result Value   Potassium 3.1 (*)    CO2 20 (*)    BUN <5 (*)    Calcium 7.7 (*)    All other components within normal limits  SALICYLATE LEVEL - Abnormal; Notable for the following components:   Salicylate Lvl [id number] (*)    All other components within normal limits  ACETAMINOPHEN LEVEL - Abnormal; Notable for the following components:   Acetaminophen (Tylenol), Serum <10 (*)    All other components within normal limits  ETHANOL - Abnormal; Notable for the following components:   Alcohol, Ethyl (B) 67 (*)    All other components within normal limits  RAPID URINE DRUG SCREEN, HOSP PERFORMED - Abnormal; Notable for the following components:   Cocaine POSITIVE (*)    Benzodiazepines POSITIVE (*)    All other components within normal limits  URINALYSIS, ROUTINE W REFLEX MICROSCOPIC - Abnormal; Notable for the following components:   Specific Gravity, Urine 1.004 (*)    Hgb urine dipstick SMALL (*)    All other components within normal limits  CBG MONITORING, ED - Abnormal; Notable for the following components:   Glucose-Capillary 101 (*)    All other components within normal limits  RESP PANEL BY RT-PCR (FLU A&B, COVID) [id number]  CBC WITH DIFFERENTIAL/PLATELET  MAGNESIUM  POC OCCULT BLOOD, ED    EKG EKG Interpretation  Date/Time:  Thursday August 29 2021 11:54:32 EST Ventricular Rate:  83 PR  Interval:  182 QRS Duration: 94 QT Interval:  392 QTC Calculation: 460 R Axis:   2 Text Interpretation: Sinus rhythm Prolonged QT Lateral Q waves noted as seen on previous ecg 07/22/21 Confirmed by Campbell Stall ([id number]) on [id number] 12:47:01 PM  Radiology No results found.  Procedures Procedures   Medications Ordered in ED Medications  LORazepam (ATIVAN) injection 0-4 mg ( Intravenous See Alternative 08/29/21 1344)    Or  LORazepam (ATIVAN) tablet 0-4 mg (1 mg Oral Given 08/29/21 1344)  LORazepam (ATIVAN) injection 0-4 mg (has no administration in time range)    Or  LORazepam (ATIVAN) tablet 0-4 mg (has no administration in time range)  thiamine tablet 100 mg (100 mg Oral Given 08/29/21 1008)    Or  thiamine (B-1) injection 100 mg ( Intravenous See Alternative 08/29/21 1008)  potassium chloride SA (KLOR-CON M) CR tablet 40 mEq (40 mEq Oral Given 08/29/21 1143)    ED Course  I have reviewed the triage vital signs and the nursing notes.  Pertinent labs & imaging results that were available during my care of the patient were reviewed by me and considered in my medical decision making (see chart for details).  Clinical Course as of 08/29/21 1543  Thu Aug 29, 2021  1507 As there were issues last time with specifically which pharmacy patient wanted his prescription to I verified the name and location of his pharmacy.  I informed him that once it is sent it will not be changed.  He states his understanding and confirms the pharmacy he wishes to use. [EH]    Clinical Course User Index [EH] Ollen Gross   MDM Rules/Calculators/A&P                         Patient is a 50 year old man who presents today for evaluation of possible alcohol withdrawal. He was  seen for similar recently, completed the Librium taper, and immediately began drinking.  He states he has been drinking for the past 6 days.  Here he was given 3 doses of Ativan through CIWA protocol, after which she was  able to sleep soundly without significant physical complaints.  He was given p.o. thiamine.  Labs are obtained, CBC is unremarkable, CMP does show mild hypokalemia with a potassium of 3.1, this was orally repleted. 's acetaminophen and salicylate levels are undetected.  He denies any SI, HI, or AVH.  Magnesium was normal.  He did report some urinary discomfort, UA without evidence of infection, does have small blood, he is not reporting symptoms consistent with kidney stone or abdominal pain. UDS is positive for benzodiazepines and cocaine.  It was not positive for cocaine at his previous ED visit.  He did report vomiting blood.  His BUN is actually low at under 5, and his Hemoccult is negative. He is given GI follow-up.  He was observed in the emergency room for over 14 hours without hematemesis.  He doesn't have a history of withdrawal seizures.  I discussed at length with him appropriate use of Librium.  He states that he is ready to quit drinking and wishes medicine to help him do this safely. He states that the holidays is why he resumed drinking after the previous taper. We discussed at length safe use of the taper. Additionally as last time there was significant difficulty in him changing the pharmacy that he went multiple times I discussed with him that once this is sent, is a controlled substance, the pharmacy it is going to cannot be changed.  He stated his understanding of this and confirmed his pharmacy.   He is given homeless shelter resources, along with resources for outpatient and inpatient drug and alcohol treatment.  Return precautions were discussed with patient who states their understanding.  At the time of discharge patient denied any unaddressed complaints or concerns.  Patient is agreeable for discharge home.  Note: Portions of this report may have been transcribed using voice recognition software. Every effort was made to ensure accuracy; however, inadvertent computerized  transcription errors may be present   Final Clinical Impression(s) / ED Diagnoses Final diagnoses:  Alcohol withdrawal syndrome without complication [facility])    Rx / DC Orders ED Discharge Orders          Ordered    chlordiazePOXIDE (LIBRIUM) 25 MG capsule        08/29/21 1506             Lorin Glass, [place] [id number]    Lianne Cure, DO [id number] [id number]

## 2021-08-30 ENCOUNTER — Emergency Department (HOSPITAL_COMMUNITY)
Admission: EM | Admit: 2021-08-30 | Discharge: 2021-08-31 | Disposition: A | Discharge status: Other Acute Inpatient Hospital | Payer: Medicare Other | Attending: Emergency Medicine | Admitting: Emergency Medicine

## 2021-08-30 ENCOUNTER — Other Ambulatory Visit: Payer: Self-pay

## 2021-08-30 ENCOUNTER — Emergency Department (HOSPITAL_COMMUNITY): Payer: Medicare Other

## 2021-08-30 ENCOUNTER — Encounter (HOSPITAL_COMMUNITY): Payer: Self-pay

## 2021-08-30 ENCOUNTER — Other Ambulatory Visit (HOSPITAL_COMMUNITY): Payer: Self-pay

## 2021-08-30 DIAGNOSIS — Z0279 Encounter for issue of other medical certificate: Secondary | ICD-10-CM | POA: Diagnosis not present

## 2021-08-30 DIAGNOSIS — F419 Anxiety disorder, unspecified: Secondary | ICD-10-CM | POA: Diagnosis not present

## 2021-08-30 DIAGNOSIS — F319 Bipolar disorder, unspecified: Secondary | ICD-10-CM | POA: Diagnosis not present

## 2021-08-30 DIAGNOSIS — Z20822 Contact with and (suspected) exposure to covid-19: Secondary | ICD-10-CM | POA: Diagnosis not present

## 2021-08-30 DIAGNOSIS — Z79899 Other long term (current) drug therapy: Secondary | ICD-10-CM | POA: Diagnosis not present

## 2021-08-30 DIAGNOSIS — J45909 Unspecified asthma, uncomplicated: Secondary | ICD-10-CM | POA: Diagnosis not present

## 2021-08-30 DIAGNOSIS — Y900 Blood alcohol level of less than 20 mg/100 ml: Secondary | ICD-10-CM | POA: Insufficient documentation

## 2021-08-30 DIAGNOSIS — F4310 Post-traumatic stress disorder, unspecified: Secondary | ICD-10-CM | POA: Insufficient documentation

## 2021-08-30 DIAGNOSIS — F10230 Alcohol dependence with withdrawal, uncomplicated: Secondary | ICD-10-CM | POA: Diagnosis not present

## 2021-08-30 DIAGNOSIS — F1020 Alcohol dependence, uncomplicated: Secondary | ICD-10-CM | POA: Insufficient documentation

## 2021-08-30 DIAGNOSIS — I10 Essential (primary) hypertension: Secondary | ICD-10-CM | POA: Insufficient documentation

## 2021-08-30 LAB — COMPREHENSIVE METABOLIC PANEL
ALT: 13 U/L (ref 0–44)
AST: 21 U/L (ref 15–41)
Albumin: 3.7 g/dL (ref 3.5–5.0)
Alkaline Phosphatase: 110 U/L (ref 38–126)
Anion gap: 9 (ref 5–15)
BUN: 10 mg/dL (ref 6–20)
CO2: 24 mmol/L (ref 22–32)
Calcium: 8 mg/dL — ABNORMAL LOW (ref 8.9–10.3)
Chloride: 107 mmol/L (ref 98–111)
Creatinine, Ser: 0.82 mg/dL (ref 0.61–1.24)
GFR, Estimated: >60 mL/min (ref >60)
Glucose, Bld: 98 mg/dL (ref 70–99)
Potassium: 3.1 mmol/L — ABNORMAL LOW (ref 3.5–5.1)
Sodium: 140 mmol/L (ref 135–145)
Total Bilirubin: 1 mg/dL (ref 0.3–1.2)
Total Protein: 7 g/dL (ref 6.5–8.1)

## 2021-08-30 LAB — CBC WITH DIFFERENTIAL/PLATELET
Abs Immature Granulocytes: 0.02 10*3/uL (ref 0.00–0.07)
Abs Immature Granulocytes: 0.02 10*3/uL (ref 0.00–0.07)
Basophils Absolute: 0 10*3/uL (ref 0.0–0.1)
Basophils Absolute: 0 10*3/uL (ref 0.0–0.1)
Basophils Relative: 0 %
Basophils Relative: 0 %
Eosinophils Absolute: 0.2 10*3/uL (ref 0.0–0.5)
Eosinophils Absolute: 0.2 10*3/uL (ref 0.0–0.5)
Eosinophils Relative: 4 %
Eosinophils Relative: 4 %
HCT: 40 % (ref 39.0–52.0)
HCT: 38.2 % — ABNORMAL LOW (ref 39.0–52.0)
Hemoglobin: 12.9 g/dL — ABNORMAL LOW (ref 13.0–17.0)
Hemoglobin: 12.1 g/dL — ABNORMAL LOW (ref 13.0–17.0)
Immature Granulocytes: 0 %
Immature Granulocytes: 0 %
Lymphocytes Relative: 36 %
Lymphocytes Relative: 36 %
Lymphs Abs: 2 10*3/uL (ref 0.7–4.0)
Lymphs Abs: 1.9 10*3/uL (ref 0.7–4.0)
MCH: 27.4 pg (ref 26.0–34.0)
MCH: 27.1 pg (ref 26.0–34.0)
MCHC: 32.3 g/dL (ref 30.0–36.0)
MCHC: 31.7 g/dL (ref 30.0–36.0)
MCV: 85.1 fL (ref 80.0–100.0)
MCV: 85.5 fL (ref 80.0–100.0)
Monocytes Absolute: 0.6 10*3/uL (ref 0.1–1.0)
Monocytes Absolute: 0.6 10*3/uL (ref 0.1–1.0)
Monocytes Relative: 11 %
Monocytes Relative: 12 %
Neutro Abs: 2.7 10*3/uL (ref 1.7–7.7)
Neutro Abs: 2.6 10*3/uL (ref 1.7–7.7)
Neutrophils Relative %: 49 %
Neutrophils Relative %: 48 %
Platelets: 220 10*3/uL (ref 150–400)
Platelets: 219 10*3/uL (ref 150–400)
RBC: 4.7 MIL/uL (ref 4.22–5.81)
RBC: 4.47 MIL/uL (ref 4.22–5.81)
RDW: 15.5 % (ref 11.5–15.5)
RDW: 15.6 % — ABNORMAL HIGH (ref 11.5–15.5)
WBC: 5.5 10*3/uL (ref 4.0–10.5)
WBC: 5.4 10*3/uL (ref 4.0–10.5)
nRBC: 0 % (ref 0.0–0.2)
nRBC: 0 % (ref 0.0–0.2)

## 2021-08-30 LAB — LIPASE, BLOOD: Lipase: 23 U/L (ref 11–51)

## 2021-08-30 LAB — ETHANOL: Alcohol, Ethyl (B): <10 mg/dL (ref <10)

## 2021-08-30 MED ORDER — LORAZEPAM 2 MG/ML IJ SOLN: 0.0000 mg | Freq: Two times a day (BID) | INTRAMUSCULAR | Status: DC

## 2021-08-30 MED ORDER — THIAMINE HCL 100 MG/ML IJ SOLN: 100.0000 mg | Freq: Every day | INTRAMUSCULAR | Status: DC

## 2021-08-30 MED ORDER — AMLODIPINE BESYLATE 5 MG PO TABS: 5.0000 mg | ORAL_TABLET | Freq: Every day | ORAL | Status: DC

## 2021-08-30 MED ORDER — SODIUM CHLORIDE 0.9 % IV BOLUS
500.0000 mL | Freq: Once | INTRAVENOUS | Status: AC
  Administered 2021-08-30: 02:00:00 500 mL via INTRAVENOUS

## 2021-08-30 MED ORDER — DOXEPIN HCL 10 MG PO CAPS
10.0000 mg | ORAL_CAPSULE | Freq: Every day | ORAL | Status: DC
  Administered 2021-08-31: 10 mg via ORAL
  Filled 2021-08-30: qty 1

## 2021-08-30 MED ORDER — THIAMINE HCL 100 MG PO TABS: 100.0000 mg | ORAL_TABLET | Freq: Every day | ORAL | Status: DC

## 2021-08-30 MED ORDER — LORAZEPAM 2 MG/ML IJ SOLN: 0.0000 mg | Freq: Four times a day (QID) | INTRAMUSCULAR | Status: DC

## 2021-08-30 MED ORDER — LORAZEPAM 1 MG PO TABS: 0.0000 mg | ORAL_TABLET | Freq: Four times a day (QID) | ORAL | Status: DC

## 2021-08-30 MED ORDER — CHLORDIAZEPOXIDE HCL 25 MG PO CAPS
ORAL_CAPSULE | ORAL | 0 refills | Status: DC
  Filled 2021-08-30: qty 10, 3d supply, fill #0

## 2021-08-30 MED ORDER — LORAZEPAM 1 MG PO TABS: 0.0000 mg | ORAL_TABLET | Freq: Two times a day (BID) | ORAL | Status: DC

## 2021-08-30 MED ORDER — CHLORDIAZEPOXIDE HCL 25 MG PO CAPS
25.0000 mg | ORAL_CAPSULE | Freq: Once | ORAL | Status: AC
  Administered 2021-08-30: 03:00:00 25 mg via ORAL
  Filled 2021-08-30: qty 1

## 2021-08-30 MED ORDER — CLONAZEPAM 0.5 MG PO TABS
0.5000 mg | ORAL_TABLET | Freq: Two times a day (BID) | ORAL | Status: DC | PRN
  Filled 2021-08-30: qty 1

## 2021-08-30 NOTE — ED Provider Notes (Signed)
Emergency Department Provider Note   I have reviewed the triage vital signs and the nursing notes.   HISTORY  Chief Complaint Shortness of Breath   HPI [name] is a 50 y.o. male past medical history reviewed below including alcohol abuse, A. fib, PTSD, homelessness presents with shortness of breath and fatigue shortly after being discharged from the area at [facility] emergency department yesterday afternoon.  He initially checked in the ER with concern for alcohol withdrawal symptoms and hematemesis.  He tells me he been drinking especially heavily over the last week and had multiple episodes of vomiting with some maroon-colored material which he thought was blood.  He denies any chest pain or shortness of breath.  He had a work-up in the emergency department and was ultimately discharged.  No additional episodes of vomiting blood since discharge.  A prescription for Librium was sent to the pharmacy but the patient states that medication was sent to the pharmacy he was unfamiliar with and he got lost.  He ultimately ran out of gas in his car.  He states he began to feel increasingly weak and shortness of breath finally called EMS for evaluation.  Denies additional drinking or drug use.  Denies any chest pain or heart palpitations.  No radiation of symptoms or other modifying factors.   Past Medical History:  Diagnosis Date   Alcohol dependence with withdrawal (HCC)    Anxiety    Asthma    Atrial fibrillation (HCC)    not on AC   Bipolar 1 disorder (HCC)    Class 2 obesity    Cocaine abuse (HCC)    Depression    Heart palpitations    with severe anxiety   Hypertension    PTSD (post-traumatic stress disorder)    PTSD (post-traumatic stress disorder)     Patient Active Problem List   Diagnosis Date Noted   Malingering 09/19/2020   Alcohol dependence with alcohol-induced mood disorder (HCC)    Alcohol withdrawal (HCC) 08/22/2020   Bipolar 1 disorder (HCC) 06/20/2020    MDD (major depressive disorder), recurrent, severe, with psychosis (HCC) 06/20/2020   Cocaine abuse (HCC) 06/14/2020   Class 2 obesity due to excess calories with body mass index (BMI) of 39.0 to 39.9 in adult 06/14/2020   Alcohol abuse with alcohol-induced mood disorder (HCC) 01/18/2017   Major depressive disorder, recurrent, severe without psychotic features (HCC)    Cocaine dependence with cocaine-induced mood disorder (HCC)    Alcohol use disorder, severe, dependence (HCC) 04/12/2015   Hypertension 09/08/2011   Post traumatic stress disorder (PTSD) 09/08/2011    Past Surgical History:  Procedure Laterality Date   NO PAST SURGERIES      Allergies Metoprolol  Family History  Problem Relation Age of Onset   Alcohol abuse Mother    Hypertension Father    Dementia Father    Alcohol abuse Maternal Grandfather     Social History Social History   Tobacco Use   Smoking status: Never   Smokeless tobacco: Never  Vaping Use   Vaping Use: Never used  Substance Use Topics   Alcohol use: Yes   Drug use: Yes    Types: Cocaine    Review of Systems  Constitutional: No fever/chills. Positive weakness.  Eyes: No visual changes. ENT: No sore throat. Cardiovascular: Denies chest pain. Respiratory: Mild shortness of breath. Gastrointestinal: No abdominal pain.  No nausea, no vomiting.  No diarrhea.  No constipation. Genitourinary: Negative for dysuria. Musculoskeletal: Negative for  back pain. Skin: Negative for rash. Neurological: Negative for headaches, focal weakness or numbness.  10-point ROS otherwise negative.  ____________________________________________   PHYSICAL EXAM:  VITAL SIGNS: ED Triage Vitals  Enc Vitals Group     BP 08/29/21 1932 (!) 151/99     Pulse Rate 08/29/21 1932 89     Resp 08/29/21 1932 16     Temp 08/29/21 1932 98.7 F (37.1 C)     Temp Source 08/29/21 1932 Oral     SpO2 08/29/21 1907 97 %    Constitutional: Alert and oriented. Well  appearing and in no acute distress. Eyes: Conjunctivae are normal.  Head: Atraumatic. Nose: No congestion/rhinnorhea. Mouth/Throat: Mucous membranes are moist.   Neck: No stridor.  Cardiovascular: Normal rate, regular rhythm. Good peripheral circulation. Grossly normal heart sounds.   Respiratory: Normal respiratory effort.  No retractions. Lungs CTAB. Gastrointestinal: Soft and nontender. No distention.  Musculoskeletal: No lower extremity tenderness nor edema. No gross deformities of extremities. Neurologic:  Normal speech and language. No gross focal neurologic deficits are appreciated.  Skin:  Skin is warm, dry and intact. No rash noted.   ____________________________________________   LABS (all labs ordered are listed, but only abnormal results are displayed)  Labs Reviewed  CBC WITH DIFFERENTIAL/PLATELET - Abnormal; Notable for the following components:      Result Value   Hemoglobin 12.9 (*)    All other components within normal limits  COMPREHENSIVE METABOLIC PANEL - Abnormal; Notable for the following components:   Potassium 3.1 (*)    Calcium 8.0 (*)    All other components within normal limits  ETHANOL  LIPASE, BLOOD   ____________________________________________  RADIOLOGY  DG Chest 2 View  Result Date: 08/30/2021 CLINICAL DATA:  Short of breath, wheezing for 24 hours EXAM: CHEST - 2 VIEW COMPARISON:  08/22/2020 FINDINGS: Frontal and lateral views of the chest demonstrate stable cardiac silhouette and ectasia of the thoracic aorta. No acute airspace disease, effusion, or pneumothorax. No acute bony abnormalities. IMPRESSION: 1. No acute intrathoracic process. Electronically Signed   By: Randa Ngo M.D.   On: 08/30/2021 01:26    ____________________________________________   PROCEDURES  Procedure(s) performed:   Procedures  None  ____________________________________________   INITIAL IMPRESSION / ASSESSMENT AND PLAN / ED COURSE  Pertinent labs &  imaging results that were available during my care of the patient were reviewed by me and considered in my medical decision making (see chart for details).   Patient returns to the emergency department with shortness of breath and fatigue.  He was discharged home from the emergency department yesterday afternoon with plan to take Librium but this was sent to the pharmacy he was not familiar with any got lost.  He is in no acute distress.  Does not appear to be in alcohol withdrawal at this time.  He has had no additional hematemesis but with the fatigue and shortness of breath plan for repeat blood work specifically to evaluate for anemia in comparison to his blood work from [facility].  He overall looks very well.  Labs reviewed. Withdrawal symptoms are minimal at this time. On reassessment patient is looking well. Called Rx in to the [facility] and gave 1st librium dose here. No significantly worsening anemia. Gave BH and EtOH treatment resources.  ____________________________________________  FINAL CLINICAL IMPRESSION(S) / ED DIAGNOSES  Final diagnoses:  Alcohol abuse     MEDICATIONS GIVEN DURING THIS VISIT:  Medications  sodium chloride 0.9 % bolus 500 mL (0 mLs  Intravenous Stopped 08/30/21 0304)  chlordiazePOXIDE (LIBRIUM) capsule 25 mg (25 mg Oral Given 08/30/21 0240)    Note:  This document was prepared using Dragon voice recognition software and may include unintentional dictation errors.  Nanda Quinton, MD, [facility] Emergency Medicine    Verlyn Lambert, [facility], MD 08/30/21 [phone]

## 2021-08-30 NOTE — ED Triage Notes (Signed)
Pt BIB [facility] for medical clearance for [facility] for detox. Pt does endorse SI at this time. Denies HI and AH & VH.
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
## 2021-08-30 NOTE — ED Provider Notes (Signed)
[facility]-EMERGENCY DEPT Provider Note   CSN: [id number] Arrival date & time: 08/30/21  1343     History Chief Complaint  Patient presents with   Medical Clearance    [name] is a 50 y.o. male.  HPI Patient presents by EMS for evaluation of need for detox from alcohol.  He is here, last night, comprehensively evaluated then discharged.  He is homeless.  He also complains of need for alcohol detox, when he presented last evening.  He is also recently received a prescription for Librium for the same concern.  He was given another prescription for Librium today after receiving a dose in the ED. he reports that after leaving the ED he took a pill after he filled the prescription but then lost the medicine when he "fell down a hill."  He states that he does not have anywhere to go, and needs to go to a 7-day rehab facility.  He does not have a car or money at this time.  He states that he needs detox from alcohol.    Past Medical History:  Diagnosis Date   Alcohol dependence with withdrawal (HCC)    Anxiety    Asthma    Atrial fibrillation (HCC)    not on AC   Bipolar 1 disorder (HCC)    Class 2 obesity    Cocaine abuse (HCC)    Depression    Heart palpitations    with severe anxiety   Hypertension    PTSD (post-traumatic stress disorder)    PTSD (post-traumatic stress disorder)     Patient Active Problem List   Diagnosis Date Noted   Malingering 09/19/2020   Alcohol dependence with alcohol-induced mood disorder (HCC)    Alcohol withdrawal (HCC) 08/22/2020   Bipolar 1 disorder (HCC) 06/20/2020   MDD (major depressive disorder), recurrent, severe, with psychosis (HCC) 06/20/2020   Cocaine abuse (HCC) 06/14/2020   Class 2 obesity due to excess calories with body mass index (BMI) of 39.0 to 39.9 in adult 06/14/2020   Alcohol abuse with alcohol-induced mood disorder (HCC) 01/18/2017   Major depressive disorder, recurrent, severe without psychotic  features (HCC)    Cocaine dependence with cocaine-induced mood disorder (HCC)    Alcohol use disorder, severe, dependence (HCC) 04/12/2015   Hypertension 09/08/2011   Post traumatic stress disorder (PTSD) 09/08/2011    Past Surgical History:  Procedure Laterality Date   NO PAST SURGERIES         Family History  Problem Relation Age of Onset   Alcohol abuse Mother    Hypertension Father    Dementia Father    Alcohol abuse Maternal Grandfather     Social History   Tobacco Use   Smoking status: Never   Smokeless tobacco: Never  Vaping Use   Vaping Use: Never used  Substance Use Topics   Alcohol use: Yes   Drug use: Yes    Types: Cocaine    Home Medications Prior to Admission medications   Medication Sig Start Date End Date Taking? Authorizing Provider  albuterol (VENTOLIN HFA) 108 (90 Base) MCG/ACT inhaler Inhale 2 puffs into the lungs every 6 (six) hours as needed for wheezing or shortness of breath.    [provider]  amLODipine (NORVASC) 5 MG tablet Take 1 tablet (5 mg total) by mouth daily. 09/05/20   Benjiman Core, MD  chlordiazePOXIDE (LIBRIUM) 25 MG capsule Take 2 capsules by mouth three times daily on day 1, then 1-2 caps twice daily  on day 2, then 1-2 caps daily on day 3. 08/30/21   Long, Wonda Olds, MD  citalopram (CELEXA) 20 MG tablet Take 1 tablet (20 mg total) by mouth daily. Patient not taking: Reported on 08/21/2021 09/05/20   Davonna Belling, MD  clonazePAM (KLONOPIN) 0.5 MG tablet Take 0.5 mg by mouth 2 (two) times daily as needed for anxiety (panic attacks).    [provider]  cloNIDine (CATAPRES) 0.2 MG tablet Take 1 tablet (0.2 mg total) by mouth 3 (three) times daily as needed. Patient not taking: Reported on 08/21/2021 09/05/20   Davonna Belling, MD  doxepin (SINEQUAN) 10 MG capsule Take 1 capsule (10 mg total) by mouth at bedtime. 09/05/20   Davonna Belling, MD  hydrOXYzine (ATARAX/VISTARIL) 50 MG tablet Take 1 tablet (50 mg  total) by mouth 3 (three) times daily as needed for anxiety. Patient not taking: Reported on 08/21/2021 09/05/20   Davonna Belling, MD  prazosin (MINIPRESS) 1 MG capsule Take 1 capsule (1 mg total) by mouth at bedtime. Patient not taking: Reported on 08/21/2021 09/05/20   Davonna Belling, MD  QUEtiapine (SEROQUEL) 300 MG tablet Take 1 tablet (300 mg total) by mouth at bedtime. Patient not taking: Reported on 08/21/2021 09/05/20   Davonna Belling, MD    Allergies    Metoprolol  Review of Systems   Review of Systems  All other systems reviewed and are negative.  Physical Exam Updated Vital Signs BP (!) 137/98 (BP Location: Right Arm)    Pulse 82    Temp 98.2 F (36.8 C) (Oral)    Resp 18    SpO2 97%   Physical Exam Vitals and nursing note reviewed.  Constitutional:      Appearance: He is well-developed. He is not ill-appearing.  HENT:     Head: Normocephalic and atraumatic.     Right Ear: External ear normal.     Left Ear: External ear normal.  Eyes:     Conjunctiva/sclera: Conjunctivae normal.     Pupils: Pupils are equal, round, and reactive to light.  Neck:     Trachea: Phonation normal.  Cardiovascular:     Rate and Rhythm: Normal rate and regular rhythm.     Heart sounds: Normal heart sounds.  Pulmonary:     Effort: Pulmonary effort is normal.     Breath sounds: Normal breath sounds.  Abdominal:     Palpations: Abdomen is soft.     Tenderness: There is no abdominal tenderness.  Musculoskeletal:        General: No swelling or tenderness. Normal range of motion.     Cervical back: Normal range of motion and neck supple.     Comments: Normal gait  Skin:    General: Skin is warm and dry.  Neurological:     General: No focal deficit present.     Mental Status: He is alert and oriented to person, place, and time.     Cranial Nerves: No cranial nerve deficit.     Sensory: No sensory deficit.     Motor: No abnormal muscle tone.     Coordination: Coordination normal.      Comments: No tremor.  He is lucid.  Psychiatric:        Mood and Affect: Mood normal.        Behavior: Behavior normal.        Thought Content: Thought content normal.        Judgment: Judgment normal.    ED Results / Procedures /  Treatments   Labs (all labs ordered are listed, but only abnormal results are displayed) Labs Reviewed  ETHANOL  BASIC METABOLIC PANEL  CBC WITH DIFFERENTIAL/PLATELET  RAPID URINE DRUG SCREEN, HOSP PERFORMED    EKG None  Radiology DG Chest 2 View  Result Date: 08/30/2021 CLINICAL DATA:  Short of breath, wheezing for 24 hours EXAM: CHEST - 2 VIEW COMPARISON:  08/22/2020 FINDINGS: Frontal and lateral views of the chest demonstrate stable cardiac silhouette and ectasia of the thoracic aorta. No acute airspace disease, effusion, or pneumothorax. No acute bony abnormalities. IMPRESSION: 1. No acute intrathoracic process. Electronically Signed   By: Randa Ngo M.D.   On: 08/30/2021 01:26    Procedures Procedures   Medications Ordered in ED Medications - No data to display  ED Course  I have reviewed the triage vital signs and the nursing notes.  Pertinent labs & imaging results that were available during my care of the patient were reviewed by me and considered in my medical decision making (see chart for details).    MDM Rules/Calculators/A&P                          Patient Vitals for the past 24 hrs:  BP Temp Temp src Pulse Resp SpO2  08/30/21 2332 (!) 137/98 -- -- 82 18 97 %  08/30/21 2225 (!) 139/102 -- -- 84 17 95 %  08/30/21 1856 126/72 -- -- 82 18 91 %  08/30/21 1408 (!) 118/95 98.2 F (36.8 C) Oral 99 18 100 %    11:37 PM Reevaluation with update and discussion with patient. After initial assessment and treatment, an updated evaluation reveals no changes started. Illness risk, progression of symptoms, discussed. [place] Decision Making: Summary: Here with recurrent complaint for need of alcohol  detoxification.  He has been seen multiple times for the same problem.  He has previously talked to TTS but that has been a couple weeks ago.  He continues to be homeless.  He states he lost the Librium prescription was given earlier today.  Critical Interventions-labs; to evaluate  Chief Complaint  Patient presents with   Medical Clearance    and assess for illness characterized as Uncertain Prognosis   After These Interventions, the Patient was reevaluated and was found clinically stable.  He will probably medically clear and not require hospitalization.  This patient is presenting for evaluation of alcohol addiction, which does require a range of treatment options, and is a complaint that involves a moderate risk of morbidity and mortality. The differential diagnoses include alcohol withdrawal, chronic alcoholism,. I decided to review external data, and in summary he is homeless, and has alcohol abuse problems.  He has had multiple ED visits and has previously been seen by TTS.  He has ongoing financial problems as well I did not require additional historical information from anyone, as the patient is lucid.  Clinical Laboratory Tests Ordered, included CBC, Metabolic panel, and urine drug screen, alcohol level .   Treatment Complication Risk Requirement noted Therapeutic Limits due to homelessness     CRITICAL CARE-no Performed by: Daleen Bo  Nursing Notes Reviewed/ Care Coordinated Applicable Imaging Reviewed Interpretation of Laboratory Data incorporated into ED treatment   Management by oncoming team, conjunction with services of TTS.      Final Clinical Impression(s) / ED Diagnoses Final diagnoses:  Alcoholism ([place])    Rx / DC Orders ED Discharge Orders  None        Daleen Bo, MD 08/30/21 [phone]

## 2021-08-30 NOTE — Discharge Instructions (Signed)
Substance Abuse Treatment Programs ° °Intensive Outpatient Programs °[facility]     °[address]      °[place], [place]                   °[phone]      ° °[facility] °[address] °[place], [place] °[phone] ° °[facility]     °(Inpatient and outpatient)     °[address]           °[phone]   ° °[facility] °[phone] (Suboxone and Methadone) ° °[address]      °[place], [place] [address]      °336-882-2125      ° °[address] °[place], [place] °852-3033 ° °[facility] (Outpatient/Inpatient, [facility])    °(insurance only) [phone]      °       °[facility] (Groups & Residential) °[place], [place] °[phone] ° °   °[facility]     °[address]     °[place], [place]      °[phone]      ° °[facility] (for caregivers and family) °[address] °[place], [place] °336-299-4655 ° ° ° ° ° °Residential Treatment Programs °[facility]      °[address], [place], [place] [address]  °([phone]      ° °[facility] °[address] [place], [place] [address] °[phone] ° °[facility]        °336-248-8914      ° °[facility] °1-800-659-3381 ° °[facility]             °[address]                                         °[place], [place]                                                °877-615-2722 or 336-784-9470                              ° °[facility] [place] °[address] °[place], [address] °[phone] ° °[facility]    °[address]      °[place], [place]     °[phone]      ° °[facility] °[address] °[place], [place] °336-285-9073 ° °[facility]   °[address]     °[place], [place] [address]     °[phone]      °Admissions: 8am-3pm M-F ° °[facility] °[address] °[place] °336-227-7417 ° °[facility]: Residential Program (90 Days)   °[place], [place]      °336-725-8389 or 800-758-6077    ° °[facility] °[place], [place] °(Walk in Hours over the weekend or by referral) ° °[facility] °[address], [place],  [address] °([phone] ° °Crisis Mobile: [facility]:  1-877-626-1772 (for crisis response 24 hours a day) °[facility] Hotline:      1-800-256-2452 °Outpatient Psychiatry and Counseling ° °[facility]: Mobile Crisis   Management 24 hours:  1-877-626-1772 ° °[facility] sliding scale fee and walk in schedule: M-F 8am-12pm/1pm-3pm °[address]  °[place], [place] [address] °[phone] ° °[facility] °[address] °[place], [place] [address] °336-703-9650 ° °[facility] ([facility])- new patient walk-in appointments available Monday - Friday 8am -3pm.          °[address] °[place], [place] [address] °336-676-6840 or crisis line- 336-676-6905 ° °[facility] Intensive Outpatient Therapy Program °[address] °[place], [place] [address] °336-832-9804 ° °[facility]                  °Crisis Services      °[phone]      °[address]     °[place], [place] [address]                ° °[facility]   °[facility] °[phone] °[address] °[place], [place] [address] ° ° °[facility]          °[address]  °[place], [place] [address]       °([phone] ° °[facility] °[address] °[place], [place] [address] °336-292-1510 ° °[facility]    °[address]    °[place], [place] [address]     °[phone]      ° °Parish McKinney, MD     °[address]     °[place] [address]     °336-282-1251     °  °[facility] °[address] °[place] [address] ° °[facility]     °[address]     °[place], [place]      °336-542-2076      ° °[facility] °Shamsher Ahluwalia, MD °[address] °[place], [place] [address] °336-420-9558 ° °[facility]     °[address]     °[place], [place] [address]     °336-274-1237      ° °[facility] °[address] °[place], [place] [address] °336-854-4450 °Resources for Temporary Residential Assistance/Crisis Centers ° °DAY CENTERS °[facility] °M-F 8am-3pm   °[address] [place], [place] [address]   336-332-0824 °Services include: laundry, barbering, support groups, case management, phone  & computer access, showers, AA/NA mtgs, mental health/substance abuse nurse, job skills class, disability information, VA assistance, spiritual classes, etc.  ° °HOMELESS SHELTERS ° °[facility]     °[facility]   °[address], [place]     °336.271.5959       °       °[facility] (women and children)       °[address] °[place], [place] [address] °336-275-0820 °[email or web address] for application and process °Application Required ° °[facility]   °[address]    °[place] [address]     °[phone]       °             °[facility] °[address] °, [place] [address] °336.273.5572 °336-235-0363(schedule application appt.) °Application Required ° °[facility] (women only)    °[address]     °[place],  [address]     °336-884-1039      °  Intake starts 6pm daily °Need valid ID, SSC, & Police report °[facility] [place] °[address] °[place], [place] °336-881-5420 °Application Required ° °[facility] (men only)     °[address]      °[place], [place]     °[phone]      ° °[facility] °(Pregnant women only) °[address] °[place], [place] °336-275-0206 ° °[facility]      °[address]      °[place], [place] [address]     °336-722-9951      °       °[facility] [place] [facility] °[address] °[place], [place] °336-723-1848 °90 day commitment/SA/Application process ° °[facility](men only[facility]     °[address]     °[place], [place]     °[phone]       °Check-in at 7pm     °       °[facility] °[address] °[place], [place] [address] °336-248-6684 °Men/Women/Women and Children must be there by 7 pm ° °[facility] °[place], [place] °336-722-8721                ° °

## 2021-08-30 NOTE — BH Assessment (Signed)
Clinician messaged Alfred Levins, RN "Hey. It's [facility]. Is the pt able to be assessed, if so he will need to be placed in a private room. Also is the pt under IVC?"   Clinician to call in five.     Redmond Pulling, MS, [facility], [facility] Triage Specialist [phone]

## 2021-08-31 ENCOUNTER — Ambulatory Visit (INDEPENDENT_AMBULATORY_CARE_PROVIDER_SITE_OTHER)
Admission: EM | Admit: 2021-08-31 | Discharge: 2021-08-31 | Disposition: A | Discharge status: Home / Self Care | Payer: Medicare Other | Source: Home / Self Care

## 2021-08-31 DIAGNOSIS — F1020 Alcohol dependence, uncomplicated: Secondary | ICD-10-CM | POA: Diagnosis not present

## 2021-08-31 DIAGNOSIS — F319 Bipolar disorder, unspecified: Secondary | ICD-10-CM | POA: Insufficient documentation

## 2021-08-31 DIAGNOSIS — F1014 Alcohol abuse with alcohol-induced mood disorder: Secondary | ICD-10-CM

## 2021-08-31 DIAGNOSIS — F419 Anxiety disorder, unspecified: Secondary | ICD-10-CM | POA: Insufficient documentation

## 2021-08-31 DIAGNOSIS — F4310 Post-traumatic stress disorder, unspecified: Secondary | ICD-10-CM | POA: Insufficient documentation

## 2021-08-31 LAB — BASIC METABOLIC PANEL
Anion gap: 6 (ref 5–15)
BUN: 12 mg/dL (ref 6–20)
CO2: 27 mmol/L (ref 22–32)
Calcium: 8.5 mg/dL — ABNORMAL LOW (ref 8.9–10.3)
Chloride: 106 mmol/L (ref 98–111)
Creatinine, Ser: 0.84 mg/dL (ref 0.61–1.24)
GFR, Estimated: >60 mL/min (ref >60)
Glucose, Bld: 101 mg/dL — ABNORMAL HIGH (ref 70–99)
Potassium: 3.1 mmol/L — ABNORMAL LOW (ref 3.5–5.1)
Sodium: 139 mmol/L (ref 135–145)

## 2021-08-31 LAB — RESP PANEL BY RT-PCR (FLU A&B, COVID) ARPGX2
Influenza A by PCR: NEGATIVE
Influenza B by PCR: NEGATIVE
SARS Coronavirus 2 by RT PCR: NEGATIVE

## 2021-08-31 LAB — ETHANOL: Alcohol, Ethyl (B): <10 mg/dL (ref <10)

## 2021-08-31 MED ORDER — POTASSIUM CHLORIDE CRYS ER 20 MEQ PO TBCR
20.0000 meq | EXTENDED_RELEASE_TABLET | Freq: Once | ORAL | Status: AC
  Administered 2021-08-31: 20 meq via ORAL
  Filled 2021-08-31: qty 1

## 2021-08-31 MED ORDER — PANTOPRAZOLE SODIUM 40 MG PO TBEC: 40.0000 mg | DELAYED_RELEASE_TABLET | Freq: Every day | ORAL | Status: DC

## 2021-08-31 MED ORDER — THIAMINE HCL 100 MG/ML IJ SOLN
100.0000 mg | Freq: Once | INTRAMUSCULAR | Status: AC
  Administered 2021-08-31: 100 mg via INTRAMUSCULAR
  Filled 2021-08-31: qty 2

## 2021-08-31 MED ORDER — ALUM & MAG HYDROXIDE-SIMETH 200-200-20 MG/5ML PO SUSP: 30.0000 mL | ORAL | Status: DC | PRN

## 2021-08-31 MED ORDER — POTASSIUM CHLORIDE CRYS ER 20 MEQ PO TBCR
20.0000 meq | EXTENDED_RELEASE_TABLET | Freq: Two times a day (BID) | ORAL | Status: DC
  Administered 2021-08-31: 20 meq via ORAL
  Filled 2021-08-31: qty 1

## 2021-08-31 MED ORDER — MAGNESIUM HYDROXIDE 400 MG/5ML PO SUSP: 30.0000 mL | Freq: Every day | ORAL | Status: DC | PRN

## 2021-08-31 MED ORDER — ONDANSETRON 4 MG PO TBDP: 4.0000 mg | ORAL_TABLET | Freq: Four times a day (QID) | ORAL | Status: DC | PRN

## 2021-08-31 MED ORDER — HYDROXYZINE HCL 25 MG PO TABS: 25.0000 mg | ORAL_TABLET | Freq: Three times a day (TID) | ORAL | Status: DC | PRN

## 2021-08-31 MED ORDER — HYDROXYZINE HCL 25 MG PO TABS: 25.0000 mg | ORAL_TABLET | Freq: Four times a day (QID) | ORAL | Status: DC | PRN

## 2021-08-31 MED ORDER — TRAZODONE HCL 50 MG PO TABS: 50.0000 mg | ORAL_TABLET | Freq: Every evening | ORAL | Status: DC | PRN

## 2021-08-31 MED ORDER — AMLODIPINE BESYLATE 5 MG PO TABS
5.0000 mg | ORAL_TABLET | Freq: Every day | ORAL | Status: DC
  Administered 2021-08-31: 5 mg via ORAL
  Filled 2021-08-31: qty 1

## 2021-08-31 MED ORDER — ACETAMINOPHEN 325 MG PO TABS: 650.0000 mg | ORAL_TABLET | Freq: Four times a day (QID) | ORAL | Status: DC | PRN

## 2021-08-31 MED ORDER — ADULT MULTIVITAMIN W/MINERALS CH
1.0000 | ORAL_TABLET | Freq: Every day | ORAL | Status: DC
  Administered 2021-08-31: 1 via ORAL
  Filled 2021-08-31: qty 1

## 2021-08-31 MED ORDER — THIAMINE HCL 100 MG PO TABS: 100.0000 mg | ORAL_TABLET | Freq: Every day | ORAL | Status: DC

## 2021-08-31 MED ORDER — LOPERAMIDE HCL 2 MG PO CAPS: 2.0000 mg | ORAL_CAPSULE | ORAL | Status: DC | PRN

## 2021-08-31 MED ORDER — CHLORDIAZEPOXIDE HCL 25 MG PO CAPS
25.0000 mg | ORAL_CAPSULE | Freq: Four times a day (QID) | ORAL | Status: DC | PRN
  Administered 2021-08-31: 25 mg via ORAL
  Filled 2021-08-31: qty 1

## 2021-08-31 NOTE — Progress Notes (Addendum)
CSW was requested by provider to seek out placement for detox treatment for this patient. CSW contacted Freight [facility]er in [place] and [place]. It was reported by [facility] in [place], that bed availability can not be shared over the phone, however the patient can present to the facility to complete an intake process for treatment. Additionally, CSW spoke with Arline Asp from [facility], who advised that [facility] will give CSW a call back about bed availability. Csw spoke with the patient is reference seeking detox treatment for him and to note any known current services. Current services reported were ACTT services through [facility]/Phone#: [phone]. The patient notes contact for him at [facility] is Larry/Phone#: [phone]. Further, he reports that Cletus is the representative at the [facility] he was staying at, located at [address] [place], [place].  The patient reported that his brother, is his payee and has closed the checking account set-up for him at [facility]. The patient expressed that he receives about 1,200 dollars a month and it cost about 400 dollars a month for him to stay in the [facility]. The patient reports his bills are paid on the 1st of every month. The patient expressed that his brother controls his money and he has asked his brother if he can have access to 20 dollars a month to pay for transportation. The patient reports receiving substance abuse treatment at [facility] from January 23rd, 2023 to March. He reports a history of sobriety for 2 months. The patient reports 1 week ago consuming 6-7 Four Lokos and expressed possible experiencing a seizure during that time. He reported seeking and receiving medical attention at that time. The patient reports a history of Alcohol use and Crack Cocaine use. The patient provided verbal consent to peak with his brother/Phone#: [phone] about  providing clothing, and to notify him that he is attempting to be linked to detox treatment. CSW notified provider of communications with the patient. It should be also noted that CSW attempted to contact [facility], however attempt was unsuccessful.   Crissie Reese, MSW, LCSW-A, LCAS-A Phone: [phone] Disposition/TOC

## 2021-08-31 NOTE — ED Notes (Signed)
Pt received breakfast 

## 2021-08-31 NOTE — ED Notes (Signed)
Pt has been brought on the unit, familiarized with the unit, nutrition offered/patient ate a sandwich and drank juice. Medications have been given and patient is now sleeping

## 2021-08-31 NOTE — ED Provider Notes (Signed)
Behavioral Health Admission H&P [facility] & OBS)  Date: 08/31/21 Patient Name: [name] MRN: [id number] Chief Complaint: No chief complaint on file.     Diagnoses:  Final diagnoses:  None    HPI: [name] is a 50 y/o male with psychiatric history of problem with grades, anxiety bipolar disorder, cocaine abuse and PTSD.  Patient initially presented to [facility] requesting detox from alcohol.  Patient was evaluated by EDP and medically cleared . He was transferred to to [facility] for continuous assessment.  Patient was seen face-to-face and his chart was reviewed by this NP.  On evaluation, Patient is alert and oriented x4, he is calm and cooperative.  Patient is speaking in a normal tone of voice at a moderate rate with good eye contact.  Patient reports his mood as euthymic with congruent affect.  His thought process is coherent and relevant.  He reports a history of alcohol withdraw seizure. He denies alcohol withdraw symptoms including diaphoresis, tremors, headache, visual changes, hallucinations, and anxiety at this time.   Patient reported that he needs detox from alcohol.  He report that he was sober for 10 months but relapsed in late November 2022. He states that he has been binge drinking on alcohol since 08/20/2021. He reports drinking 5-7 four Lokos daily. He reports his last drink was on morning of 08/30/2021. Per chart review patient has been seen in the ED 4 times over the past few days with the same complaint. He reports that he would like to go to a long term alcohol rehab center in [place] for substance abuse treatment. He says he was treated there January to March 2022 and was able to maintain sobriety for approximately 10 months.   Patient denies suicidal ideation, homicidal ideation, paranoia, and  hallucination. Patient admits to using cocaine occasionally. He reports that he is currently homeless.    PHQ 2-9:  Flowsheet Row ED from 06/24/2021 in [facility] EMERGENCY DEPARTMENT Most recent reading at 06/25/2021  2:51 PM ED from 09/19/2020 in [facility] Most recent reading at 09/19/2020  5:54 PM [facility] Most recent reading at 09/19/2020  1:27 PM  Thoughts that you would be better off dead, or of hurting yourself in some way Not at all Nearly every day  [Phreesia 09/19/2020] Several days  PHQ-9 Total Score 11 27 19        Flowsheet Row ED from 08/31/2021 in [facility] ED from 08/30/2021 in [facility] from 08/29/2021 in [facility]  C-SSRS RISK CATEGORY No Risk High Risk No Risk        Total Time spent with patient: 20 minutes  Musculoskeletal  Strength & Muscle Tone: within normal limits Gait & Station: normal Patient leans: Right  Psychiatric Specialty Exam  Presentation General Appearance: Appropriate for Environment; Casual; Disheveled  Eye Contact:Good  Speech:Clear and Coherent; Normal Rate  Speech Volume:Normal  Handedness:Right   Mood and Affect  Mood:Depressed  Affect:Appropriate; Congruent   Thought Process  Thought Processes:Coherent  Descriptions of Associations:Intact  Orientation:Full (Time, Place and Person)  Thought Content:WDL  Diagnosis of Schizophrenia or Schizoaffective disorder in past: No  Duration of Psychotic Symptoms: Less than six months  Hallucinations:No data recorded Ideas of Reference:None  Suicidal Thoughts:No data recorded Homicidal Thoughts:No data recorded  Sensorium  Memory:Immediate Good; Recent Good  Judgment:Intact  Insight:Present   Art therapist  Concentration:No data recorded Attention Span:Good  Recall:Good  Fund of Knowledge:Good  Language:Good   Psychomotor Activity  Psychomotor Activity:No data recorded  Assets  Assets:Communication Skills;  Desire for Improvement; Social Support   Sleep  Sleep:No data recorded  No data recorded  Physical Exam Vitals and nursing note reviewed.  Constitutional:      General: He is not in acute distress.    Appearance: He is well-developed. He is obese. He is not ill-appearing.  HENT:     Head: Normocephalic and atraumatic.  Eyes:     Conjunctiva/sclera: Conjunctivae normal.  Cardiovascular:     Rate and Rhythm: Normal rate.  Pulmonary:     Effort: Pulmonary effort is normal. No respiratory distress.     Breath sounds: Normal breath sounds.  Abdominal:     Palpations: Abdomen is soft.     Tenderness: There is no abdominal tenderness.  Musculoskeletal:        General: Normal range of motion.     Cervical back: Neck supple.  Skin:    General: Skin is warm.     Capillary Refill: Capillary refill takes less than 2 seconds.  Neurological:     Mental Status: He is alert and oriented to person, place, and time.  Psychiatric:        Attention and Perception: Attention and perception normal.        Mood and Affect: Mood is depressed.        Speech: Speech normal.        Behavior: Behavior normal.        Thought Content: Thought content normal.        Cognition and Memory: Cognition normal.        Judgment: Judgment normal.   Review of Systems  Constitutional: Negative.   HENT: Negative.    Eyes: Negative.   Respiratory: Negative.    Cardiovascular: Negative.   Gastrointestinal: Negative.   Genitourinary: Negative.   Musculoskeletal: Negative.   Skin: Negative.   Neurological: Negative.   Endo/Heme/Allergies: Negative.   Psychiatric/Behavioral:  Positive for depression and substance abuse.    Blood pressure (!) 137/98, pulse 81, temperature 98.4 F (36.9 C), temperature source Oral, resp. rate 16, SpO2 96 %. There is no height or weight on file to calculate BMI.  Past Psychiatric History:    Is the patient at risk to self? No  Has the patient been a risk to self in the  past 6 months? Yes .    Has the patient been a risk to self within the distant past? Yes   Is the patient a risk to others? No   Has the patient been a risk to others in the past 6 months? No   Has the patient been a risk to others within the distant past? No   Past Medical History:  Past Medical History:  Diagnosis Date   Alcohol dependence with withdrawal (HCC)    Anxiety    Asthma    Atrial fibrillation (HCC)    not on AC   Bipolar 1 disorder (HCC)    Class 2 obesity    Cocaine abuse (HCC)    Depression    Heart palpitations    with severe anxiety   Hypertension    PTSD (post-traumatic stress disorder)    PTSD (post-traumatic stress disorder)     Past Surgical History:  Procedure Laterality Date   NO PAST SURGERIES      Family History:  Family History  Problem Relation Age  of Onset   Alcohol abuse Mother    Hypertension Father    Dementia Father    Alcohol abuse Maternal Grandfather     Social History:  Social History   Socioeconomic History   Marital status: Single    Spouse name: Not on file   Number of children: Not on file   Years of education: Not on file   Highest education level: Not on file  Occupational History   Occupation: disabled  Tobacco Use   Smoking status: Never   Smokeless tobacco: Never  Vaping Use   Vaping Use: Never used  Substance and Sexual Activity   Alcohol use: Yes   Drug use: Yes    Types: Cocaine   Sexual activity: Not Currently    Comment: pt said he has an enlarged prostate  Other Topics Concern   Not on file  Social History Narrative   ** Merged History Encounter **       Social Determinants of Health   Financial Resource Strain: Not on file  Food Insecurity: Not on file  Transportation Needs: Not on file  Physical Activity: Not on file  Stress: Not on file  Social Connections: Not on file  Intimate Partner Violence: Not on file    SDOH:  SDOH Screenings   Alcohol Screen: Not on file  Depression  (PHQ2-9): Medium Risk   PHQ-2 Score: 11  Financial Resource Strain: Not on file  Food Insecurity: Not on file  Housing: Not on file  Physical Activity: Not on file  Social Connections: Not on file  Stress: Not on file  Tobacco Use: Low Risk    Smoking Tobacco Use: Never   Smokeless Tobacco Use: Never   Passive Exposure: Not on file  Transportation Needs: Not on file    Last Labs:  Admission on 08/30/2021, Discharged on 08/31/2021  Component Date Value Ref Range Status   Alcohol, Ethyl (B) 08/30/2021 <10  <10 mg/dL Final   Comment: (NOTE) Lowest detectable limit for serum alcohol is 10 mg/dL.  For medical purposes only. Performed at [facility], [address] [place], [place] [address]    Sodium 08/30/2021 139  135 - 145 mmol/L Final   Potassium 08/30/2021 3.1 (L)  3.5 - 5.1 mmol/L Final   Chloride 08/30/2021 106  98 - 111 mmol/L Final   CO2 08/30/2021 27  22 - 32 mmol/L Final   Glucose, Bld 08/30/2021 101 (H)  70 - 99 mg/dL Final   Glucose reference range applies only to samples taken after fasting for at least 8 hours.   BUN 08/30/2021 12  6 - 20 mg/dL Final   Creatinine, Ser 08/30/2021 0.84  0.61 - 1.24 mg/dL Final   Calcium 19/14/7829 8.5 (L)  8.9 - 10.3 mg/dL Final   GFR, Estimated 08/30/2021 >60  >60 mL/min Final   Comment: (NOTE) Calculated using the CKD-EPI Creatinine Equation (2021)    Anion gap 08/30/2021 6  5 - 15 Final   Performed at [facility], [address] [place], [place] [address]   WBC 08/30/2021 5.4  4.0 - 10.5 K/uL Final   RBC 08/30/2021 4.47  4.22 - 5.81 MIL/uL Final   Hemoglobin 08/30/2021 12.1 (L)  13.0 - 17.0 g/dL Final   HCT 08/65/7846 38.2 (L)  39.0 - 52.0 % Final   MCV 08/30/2021 85.5  80.0 - 100.0 fL Final   MCH 08/30/2021 27.1  26.0 - 34.0 pg Final   MCHC 08/30/2021 31.7  30.0 -  36.0 g/dL Final   RDW 19/50/9326 15.6 (H)  11.5 - 15.5 % Final   Platelets 08/30/2021 219  150 - 400 K/uL Final   nRBC  08/30/2021 0.0  0.0 - 0.2 % Final   Neutrophils Relative % 08/30/2021 48  % Final   Neutro Abs 08/30/2021 2.6  1.7 - 7.7 K/uL Final   Lymphocytes Relative 08/30/2021 36  % Final   Lymphs Abs 08/30/2021 1.9  0.7 - 4.0 K/uL Final   Monocytes Relative 08/30/2021 12  % Final   Monocytes Absolute 08/30/2021 0.6  0.1 - 1.0 K/uL Final   Eosinophils Relative 08/30/2021 4  % Final   Eosinophils Absolute 08/30/2021 0.2  0.0 - 0.5 K/uL Final   Basophils Relative 08/30/2021 0  % Final   Basophils Absolute 08/30/2021 0.0  0.0 - 0.1 K/uL Final   Immature Granulocytes 08/30/2021 0  % Final   Abs Immature Granulocytes 08/30/2021 0.02  0.00 - 0.07 K/uL Final   Performed at [facility], [address], [place], [place] [address]   SARS Coronavirus 2 by RT PCR 08/30/2021 NEGATIVE  NEGATIVE Final   Comment: (NOTE) SARS-CoV-2 target nucleic acids are NOT DETECTED.  The SARS-CoV-2 RNA is generally detectable in upper respiratory specimens during the acute phase of infection. The lowest concentration of SARS-CoV-2 viral copies this assay can detect is 138 copies/mL. A negative result does not preclude SARS-Cov-2 infection and should not be used as the sole basis for treatment or other patient management decisions. A negative result may occur with  improper specimen collection/handling, submission of specimen other than nasopharyngeal swab, presence of viral mutation(s) within the areas targeted by this assay, and inadequate number of viral copies(<138 copies/mL). A negative result must be combined with clinical observations, patient history, and epidemiological information. The expected result is Negative.  Fact Sheet for Patients:  BloggerCourse.com  Fact Sheet for Healthcare Providers:  SeriousBroker.it  This test is no                          t yet approved or cleared by the [place] FDA and  has been authorized for  detection and/or diagnosis of SARS-CoV-2 by FDA under an Emergency Use Authorization (EUA). This EUA will remain  in effect (meaning this test can be used) for the duration of the COVID-19 declaration under Section 564(b)(1) of the Act, 21 U.S.C.section 360bbb-3(b)(1), unless the authorization is terminated  or revoked sooner.       Influenza A by PCR 08/30/2021 NEGATIVE  NEGATIVE Final   Influenza B by PCR 08/30/2021 NEGATIVE  NEGATIVE Final   Comment: (NOTE) The Xpert Xpress SARS-CoV-2/FLU/RSV plus assay is intended as an aid in the diagnosis of influenza from Nasopharyngeal swab specimens and should not be used as a sole basis for treatment. Nasal washings and aspirates are unacceptable for Xpert Xpress SARS-CoV-2/FLU/RSV testing.  Fact Sheet for Patients: BloggerCourse.com  Fact Sheet for Healthcare Providers: SeriousBroker.it  This test is not yet approved or cleared by the [place] FDA and has been authorized for detection and/or diagnosis of SARS-CoV-2 by FDA under an Emergency Use Authorization (EUA). This EUA will remain in effect (meaning this test can be used) for the duration of the COVID-19 declaration under Section 564(b)(1) of the Act, 21 U.S.C. section 360bbb-3(b)(1), unless the authorization is terminated or revoked.  Performed at [facility], [address], [place], [place] [address]   Admission on 08/29/2021, Discharged  on 08/30/2021  Component Date Value Ref Range Status   WBC 08/30/2021 5.5  4.0 - 10.5 K/uL Final   RBC 08/30/2021 4.70  4.22 - 5.81 MIL/uL Final   Hemoglobin 08/30/2021 12.9 (L)  13.0 - 17.0 g/dL Final   HCT 95/28/4132 40.0  39.0 - 52.0 % Final   MCV 08/30/2021 85.1  80.0 - 100.0 fL Final   MCH 08/30/2021 27.4  26.0 - 34.0 pg Final   MCHC 08/30/2021 32.3  30.0 - 36.0 g/dL Final   RDW 44/09/270 15.5  11.5 - 15.5 % Final   Platelets 08/30/2021 220  150 - 400 K/uL  Final   nRBC 08/30/2021 0.0  0.0 - 0.2 % Final   Neutrophils Relative % 08/30/2021 49  % Final   Neutro Abs 08/30/2021 2.7  1.7 - 7.7 K/uL Final   Lymphocytes Relative 08/30/2021 36  % Final   Lymphs Abs 08/30/2021 2.0  0.7 - 4.0 K/uL Final   Monocytes Relative 08/30/2021 11  % Final   Monocytes Absolute 08/30/2021 0.6  0.1 - 1.0 K/uL Final   Eosinophils Relative 08/30/2021 4  % Final   Eosinophils Absolute 08/30/2021 0.2  0.0 - 0.5 K/uL Final   Basophils Relative 08/30/2021 0  % Final   Basophils Absolute 08/30/2021 0.0  0.0 - 0.1 K/uL Final   Immature Granulocytes 08/30/2021 0  % Final   Abs Immature Granulocytes 08/30/2021 0.02  0.00 - 0.07 K/uL Final   Performed at [facility], [address] [place], [place] [address]   Sodium 08/30/2021 140  135 - 145 mmol/L Final   Potassium 08/30/2021 3.1 (L)  3.5 - 5.1 mmol/L Final   Chloride 08/30/2021 107  98 - 111 mmol/L Final   CO2 08/30/2021 24  22 - 32 mmol/L Final   Glucose, Bld 08/30/2021 98  70 - 99 mg/dL Final   Glucose reference range applies only to samples taken after fasting for at least 8 hours.   BUN 08/30/2021 10  6 - 20 mg/dL Final   Creatinine, Ser 08/30/2021 0.82  0.61 - 1.24 mg/dL Final   Calcium 40/34/7425 8.0 (L)  8.9 - 10.3 mg/dL Final   Total Protein 95/63/8756 7.0  6.5 - 8.1 g/dL Final   Albumin 43/32/9518 3.7  3.5 - 5.0 g/dL Final   AST 84/16/6063 21  15 - 41 U/L Final   ALT 08/30/2021 13  0 - 44 U/L Final   Alkaline Phosphatase 08/30/2021 110  38 - 126 U/L Final   Total Bilirubin 08/30/2021 1.0  0.3 - 1.2 mg/dL Final   GFR, Estimated 08/30/2021 >60  >60 mL/min Final   Comment: (NOTE) Calculated using the CKD-EPI Creatinine Equation (2021)    Anion gap 08/30/2021 9  5 - 15 Final   Performed at [facility], [address], [place], [place] [address]   Alcohol, Ethyl (B) 08/30/2021 <10  <10 mg/dL Final   Comment: (NOTE) Lowest detectable limit for serum alcohol is 10  mg/dL.  For medical purposes only. Performed at [facility], [address] [place], [place] [address]    Lipase 08/30/2021 23  11 - 51 U/L Final   Performed at [facility], [address], [place], [place] [address]  Admission on 08/29/2021, Discharged on 08/29/2021  Component Date Value Ref Range Status   SARS Coronavirus 2 by RT PCR 08/29/2021 NEGATIVE  NEGATIVE Final   Comment: (NOTE) SARS-CoV-2 target nucleic acids are NOT DETECTED.  The  SARS-CoV-2 RNA is generally detectable in upper respiratory specimens during the acute phase of infection. The lowest concentration of SARS-CoV-2 viral copies this assay can detect is 138 copies/mL. A negative result does not preclude SARS-Cov-2 infection and should not be used as the sole basis for treatment or other patient management decisions. A negative result may occur with  improper specimen collection/handling, submission of specimen other than nasopharyngeal swab, presence of viral mutation(s) within the areas targeted by this assay, and inadequate number of viral copies(<138 copies/mL). A negative result must be combined with clinical observations, patient history, and epidemiological information. The expected result is Negative.  Fact Sheet for Patients:  BloggerCourse.com  Fact Sheet for Healthcare Providers:  SeriousBroker.it  This test is no                          t yet approved or cleared by the [place] FDA and  has been authorized for detection and/or diagnosis of SARS-CoV-2 by FDA under an Emergency Use Authorization (EUA). This EUA will remain  in effect (meaning this test can be used) for the duration of the COVID-19 declaration under Section 564(b)(1) of the Act, 21 U.S.C.section 360bbb-3(b)(1), unless the authorization is terminated  or revoked sooner.       Influenza A by PCR 08/29/2021 NEGATIVE  NEGATIVE Final    Influenza B by PCR 08/29/2021 NEGATIVE  NEGATIVE Final   Comment: (NOTE) The Xpert Xpress SARS-CoV-2/FLU/RSV plus assay is intended as an aid in the diagnosis of influenza from Nasopharyngeal swab specimens and should not be used as a sole basis for treatment. Nasal washings and aspirates are unacceptable for Xpert Xpress SARS-CoV-2/FLU/RSV testing.  Fact Sheet for Patients: BloggerCourse.com  Fact Sheet for Healthcare Providers: SeriousBroker.it  This test is not yet approved or cleared by the [place] FDA and has been authorized for detection and/or diagnosis of SARS-CoV-2 by FDA under an Emergency Use Authorization (EUA). This EUA will remain in effect (meaning this test can be used) for the duration of the COVID-19 declaration under Section 564(b)(1) of the Act, 21 U.S.C. section 360bbb-3(b)(1), unless the authorization is terminated or revoked.  Performed at [facility], [address] [place], [place] [address]    Glucose-Capillary 08/29/2021 101 (H)  70 - 99 mg/dL Final   Glucose reference range applies only to samples taken after fasting for at least 8 hours.   Comment 1 08/29/2021 Notify RN   Final   Comment 2 08/29/2021 Document in Chart   Final   Sodium 08/29/2021 135  135 - 145 mmol/L Final   Potassium 08/29/2021 3.1 (L)  3.5 - 5.1 mmol/L Final   Chloride 08/29/2021 102  98 - 111 mmol/L Final   CO2 08/29/2021 20 (L)  22 - 32 mmol/L Final   Glucose, Bld 08/29/2021 93  70 - 99 mg/dL Final   Glucose reference range applies only to samples taken after fasting for at least 8 hours.   BUN 08/29/2021 <5 (L)  6 - 20 mg/dL Final   Creatinine, Ser 08/29/2021 0.71  0.61 - 1.24 mg/dL Final   Calcium 02/29/1600 7.7 (L)  8.9 - 10.3 mg/dL Final   Total Protein 09/32/3557 7.0  6.5 - 8.1 g/dL Final   Albumin 32/20/2542 3.8  3.5 - 5.0 g/dL Final   AST 70/62/3762 25  15 - 41 U/L Final   ALT 08/29/2021 14  0 - 44 U/L Final    Alkaline Phosphatase 08/29/2021  115  38 - 126 U/L Final   Total Bilirubin 08/29/2021 0.8  0.3 - 1.2 mg/dL Final   GFR, Estimated 08/29/2021 >60  >60 mL/min Final   Comment: (NOTE) Calculated using the CKD-EPI Creatinine Equation (2021)    Anion gap 08/29/2021 13  5 - 15 Final   Performed at [facility], [address], [place], [place] [address]   Salicylate Lvl 08/29/2021 <7.0 (L)  7.0 - 30.0 mg/dL Final   Performed at [facility], [address], [place], [place] [address]   Acetaminophen (Tylenol), Serum 08/29/2021 <10 (L)  10 - 30 ug/mL Final   Comment: (NOTE) Therapeutic concentrations vary significantly. A range of 10-30 ug/mL  may be an effective concentration for many patients. However, some  are best treated at concentrations outside of this range. Acetaminophen concentrations >150 ug/mL at 4 hours after ingestion  and >50 ug/mL at 12 hours after ingestion are often associated with  toxic reactions.  Performed at [facility], [address], [place], [place] [address]    Alcohol, Ethyl (B) 08/29/2021 67 (H)  <10 mg/dL Final   Comment: (NOTE) Lowest detectable limit for serum alcohol is 10 mg/dL.  For medical purposes only. Performed at [facility], [address] [place], [place] [address]    Opiates 08/29/2021 NONE DETECTED  NONE DETECTED Final   Cocaine 08/29/2021 POSITIVE (A)  NONE DETECTED Final   Benzodiazepines 08/29/2021 POSITIVE (A)  NONE DETECTED Final   Amphetamines 08/29/2021 NONE DETECTED  NONE DETECTED Final   Tetrahydrocannabinol 08/29/2021 NONE DETECTED  NONE DETECTED Final   Barbiturates 08/29/2021 NONE DETECTED  NONE DETECTED Final   Comment: (NOTE) DRUG SCREEN FOR MEDICAL PURPOSES ONLY.  IF CONFIRMATION IS NEEDED FOR ANY PURPOSE, NOTIFY LAB WITHIN 5 DAYS.  LOWEST DETECTABLE LIMITS FOR URINE DRUG SCREEN Drug Class                     Cutoff (ng/mL) Amphetamine and metabolites    1000 Barbiturate and metabolites     200 Benzodiazepine                 200 Tricyclics and metabolites     300 Opiates and metabolites        300 Cocaine and metabolites        300 THC                            50 Performed at [facility], [address] [place], [place] [address]    WBC 08/29/2021 6.3  4.0 - 10.5 K/uL Final   RBC 08/29/2021 4.92  4.22 - 5.81 MIL/uL Final   Hemoglobin 08/29/2021 13.2  13.0 - 17.0 g/dL Final   HCT 13/04/6577 41.8  39.0 - 52.0 % Final   MCV 08/29/2021 85.0  80.0 - 100.0 fL Final   MCH 08/29/2021 26.8  26.0 - 34.0 pg Final   MCHC 08/29/2021 31.6  30.0 - 36.0 g/dL Final   RDW 46/96/2952 15.2  11.5 - 15.5 % Final   Platelets 08/29/2021 258  150 - 400 K/uL Final   nRBC 08/29/2021 0.0  0.0 - 0.2 % Final   Neutrophils Relative % 08/29/2021 64  % Final   Neutro Abs 08/29/2021 4.1  1.7 - 7.7 K/uL Final   Lymphocytes Relative 08/29/2021 25  % Final   Lymphs Abs 08/29/2021 1.6  0.7 - 4.0 K/uL  Final   Monocytes Relative 08/29/2021 8  % Final   Monocytes Absolute 08/29/2021 0.5  0.1 - 1.0 K/uL Final   Eosinophils Relative 08/29/2021 1  % Final   Eosinophils Absolute 08/29/2021 0.0  0.0 - 0.5 K/uL Final   Basophils Relative 08/29/2021 1  % Final   Basophils Absolute 08/29/2021 0.0  0.0 - 0.1 K/uL Final   Immature Granulocytes 08/29/2021 1  % Final   Abs Immature Granulocytes 08/29/2021 0.03  0.00 - 0.07 K/uL Final   Performed at [facility], [address], [place], [place] [address]   Color, Urine 08/29/2021 YELLOW  YELLOW Final   APPearance 08/29/2021 CLEAR  CLEAR Final   Specific Gravity, Urine 08/29/2021 1.004 (L)  1.005 - 1.030 Final   pH 08/29/2021 7.0  5.0 - 8.0 Final   Glucose, UA 08/29/2021 NEGATIVE  NEGATIVE mg/dL Final   Hgb urine dipstick 08/29/2021 SMALL (A)  NEGATIVE Final   Bilirubin Urine 08/29/2021 NEGATIVE  NEGATIVE Final   Ketones, ur 08/29/2021 NEGATIVE  NEGATIVE mg/dL Final   Protein, ur 42/87/6811 NEGATIVE  NEGATIVE mg/dL Final   Nitrite 57/26/2035  NEGATIVE  NEGATIVE Final   Leukocytes,Ua 08/29/2021 NEGATIVE  NEGATIVE Final   RBC / HPF 08/29/2021 0-5  0 - 5 RBC/hpf Final   WBC, UA 08/29/2021 0-5  0 - 5 WBC/hpf Final   Bacteria, UA 08/29/2021 NONE SEEN  NONE SEEN Final   Performed at [facility], [address], [place], [place] [address]   Magnesium 08/29/2021 1.9  1.7 - 2.4 mg/dL Final   Performed at [facility], [address], [place], [place] [address]   Fecal Occult Bld 08/29/2021 NEGATIVE  NEGATIVE Final  Admission on 08/21/2021, Discharged on 08/21/2021  Component Date Value Ref Range Status   Sodium 08/21/2021 138  135 - 145 mmol/L Final   Potassium 08/21/2021 3.0 (L)  3.5 - 5.1 mmol/L Final   Chloride 08/21/2021 102  98 - 111 mmol/L Final   CO2 08/21/2021 26  22 - 32 mmol/L Final   Glucose, Bld 08/21/2021 123 (H)  70 - 99 mg/dL Final   Glucose reference range applies only to samples taken after fasting for at least 8 hours.   BUN 08/21/2021 <5 (L)  6 - 20 mg/dL Final   Creatinine, Ser 08/21/2021 0.66  0.61 - 1.24 mg/dL Final   Calcium 36/46/8032 7.4 (L)  8.9 - 10.3 mg/dL Final   Total Protein 08/24/8249 6.7  6.5 - 8.1 g/dL Final   Albumin 03/70/4888 3.5  3.5 - 5.0 g/dL Final   AST 91/69/4503 18  15 - 41 U/L Final   ALT 08/21/2021 12  0 - 44 U/L Final   Alkaline Phosphatase 08/21/2021 104  38 - 126 U/L Final   Total Bilirubin 08/21/2021 0.5  0.3 - 1.2 mg/dL Final   GFR, Estimated 08/21/2021 >60  >60 mL/min Final   Comment: (NOTE) Calculated using the CKD-EPI Creatinine Equation (2021)    Anion gap 08/21/2021 10  5 - 15 Final   Performed at [facility], [address], [place], [place] [address]   Alcohol, Ethyl (B) 08/21/2021 134 (H)  <10 mg/dL Final   Comment: (NOTE) Lowest detectable limit for serum alcohol is 10 mg/dL.  For medical purposes only. Performed at [name], [address], [place], [place] [address]    WBC 08/21/2021 5.9  4.0 - 10.5 K/uL Final   RBC 08/21/2021 4.77   4.22 -  5.81 MIL/uL Final   Hemoglobin 08/21/2021 12.8 (L)  13.0 - 17.0 g/dL Final   HCT 05/39/7673 39.8  39.0 - 52.0 % Final   MCV 08/21/2021 83.4  80.0 - 100.0 fL Final   MCH 08/21/2021 26.8  26.0 - 34.0 pg Final   MCHC 08/21/2021 32.2  30.0 - 36.0 g/dL Final   RDW 41/93/7902 14.6  11.5 - 15.5 % Final   Platelets 08/21/2021 281  150 - 400 K/uL Final   nRBC 08/21/2021 0.0  0.0 - 0.2 % Final   Performed at [facility], [address] [place], [place] [address]   Opiates 08/21/2021 NONE DETECTED  NONE DETECTED Final   Cocaine 08/21/2021 NONE DETECTED  NONE DETECTED Final   Benzodiazepines 08/21/2021 POSITIVE (A)  NONE DETECTED Final   Amphetamines 08/21/2021 NONE DETECTED  NONE DETECTED Final   Tetrahydrocannabinol 08/21/2021 NONE DETECTED  NONE DETECTED Final   Barbiturates 08/21/2021 NONE DETECTED  NONE DETECTED Final   Comment: (NOTE) DRUG SCREEN FOR MEDICAL PURPOSES ONLY.  IF CONFIRMATION IS NEEDED FOR ANY PURPOSE, NOTIFY LAB WITHIN 5 DAYS.  LOWEST DETECTABLE LIMITS FOR URINE DRUG SCREEN Drug Class                     Cutoff (ng/mL) Amphetamine and metabolites    1000 Barbiturate and metabolites    200 Benzodiazepine                 200 Tricyclics and metabolites     300 Opiates and metabolites        300 Cocaine and metabolites        300 THC                            50 Performed at [facility], [address] [place], [place] [address]   Admission on 08/20/2021, Discharged on 08/20/2021  Component Date Value Ref Range Status   Sodium 08/20/2021 142  135 - 145 mmol/L Final   Potassium 08/20/2021 3.0 (L)  3.5 - 5.1 mmol/L Final   Chloride 08/20/2021 104  98 - 111 mmol/L Final   CO2 08/20/2021 27  22 - 32 mmol/L Final   Glucose, Bld 08/20/2021 137 (H)  70 - 99 mg/dL Final   Glucose reference range applies only to samples taken after fasting for at least 8 hours.   BUN 08/20/2021 7  6 - 20 mg/dL Final   Creatinine, Ser 08/20/2021 0.64  0.61 - 1.24 mg/dL  Final   Calcium 24/26/8341 8.2 (L)  8.9 - 10.3 mg/dL Final   Total Protein 96/22/2979 7.1  6.5 - 8.1 g/dL Final   Albumin 89/21/1941 3.8  3.5 - 5.0 g/dL Final   AST 74/04/1447 17  15 - 41 U/L Final   ALT 08/20/2021 10  0 - 44 U/L Final   Alkaline Phosphatase 08/20/2021 100  38 - 126 U/L Final   Total Bilirubin 08/20/2021 0.5  0.3 - 1.2 mg/dL Final   GFR, Estimated 08/20/2021 >60  >60 mL/min Final   Comment: (NOTE) Calculated using the CKD-EPI Creatinine Equation (2021)    Anion gap 08/20/2021 11  5 - 15 Final   Performed at [facility], [address] [place], [place] [address]   Alcohol, Ethyl (B) 08/20/2021 272 (H)  <10 mg/dL Final   Comment: (NOTE) Lowest detectable limit for serum alcohol is 10 mg/dL.  For medical purposes only. Performed  at [facility], [address] [place], [place] [address]    WBC 08/20/2021 6.0  4.0 - 10.5 K/uL Final   RBC 08/20/2021 5.00  4.22 - 5.81 MIL/uL Final   Hemoglobin 08/20/2021 13.6  13.0 - 17.0 g/dL Final   HCT 47/82/9562 41.7  39.0 - 52.0 % Final   MCV 08/20/2021 83.4  80.0 - 100.0 fL Final   MCH 08/20/2021 27.2  26.0 - 34.0 pg Final   MCHC 08/20/2021 32.6  30.0 - 36.0 g/dL Final   RDW 13/04/6577 14.9  11.5 - 15.5 % Final   Platelets 08/20/2021 266  150 - 400 K/uL Final   nRBC 08/20/2021 0.0  0.0 - 0.2 % Final   Performed at [facility], [address] [place], [place] [address]   Acetaminophen (Tylenol), Serum 08/20/2021 <10 (L)  10 - 30 ug/mL Final   Comment: (NOTE) Therapeutic concentrations vary significantly. A range of 10-30 ug/mL  may be an effective concentration for many patients. However, some  are best treated at concentrations outside of this range. Acetaminophen concentrations >150 ug/mL at 4 hours after ingestion  and >50 ug/mL at 12 hours after ingestion are often associated with  toxic reactions.  Performed at [facility], [address] [place], [place] [address]    Salicylate Lvl 08/20/2021 <7.0 (L)  7.0 - 30.0 mg/dL Final   Performed at [facility], [address] [place], [place] [address]  Admission on 07/22/2021, Discharged on 07/23/2021  Component Date Value Ref Range Status   Sodium 07/22/2021 139  135 - 145 mmol/L Final   Potassium 07/22/2021 3.8  3.5 - 5.1 mmol/L Final   Chloride 07/22/2021 106  98 - 111 mmol/L Final   CO2 07/22/2021 25  22 - 32 mmol/L Final   Glucose, Bld 07/22/2021 89  70 - 99 mg/dL Final   Glucose reference range applies only to samples taken after fasting for at least 8 hours.   BUN 07/22/2021 6  6 - 20 mg/dL Final   Creatinine, Ser 07/22/2021 0.73  0.61 - 1.24 mg/dL Final   Calcium 09/03/7251 8.1 (L)  8.9 - 10.3 mg/dL Final   GFR, Estimated 07/22/2021 >60  >60 mL/min Final   Comment: (NOTE) Calculated using the CKD-EPI Creatinine Equation (2021)    Anion gap 07/22/2021 8  5 - 15 Final   Performed at [facility], [address] [place], [place] [address]   WBC 07/22/2021 7.2  4.0 - 10.5 K/uL Final   RBC 07/22/2021 5.00  4.22 - 5.81 MIL/uL Final   Hemoglobin 07/22/2021 13.3  13.0 - 17.0 g/dL Final   HCT 34/74/2595 42.8  39.0 - 52.0 % Final   MCV 07/22/2021 85.6  80.0 - 100.0 fL Final   MCH 07/22/2021 26.6  26.0 - 34.0 pg Final   MCHC 07/22/2021 31.1  30.0 - 36.0 g/dL Final   RDW 63/87/5643 16.3 (H)  11.5 - 15.5 % Final   Platelets 07/22/2021 258  150 - 400 K/uL Final   nRBC 07/22/2021 0.0  0.0 - 0.2 % Final   Performed at [facility], [address], [place], [place] [address]   Troponin I (High Sensitivity) 07/22/2021 7  <18 ng/L Final   Comment: (NOTE) Elevated high sensitivity troponin I (hsTnI) values and significant  changes across serial measurements may suggest ACS but many other  chronic and acute conditions are known to elevate hsTnI results.  Refer  to the "Links" section for chest pain algorithms and additional  guidance. Performed at  [facility], [address] [place], [place] [address]    Opiates 07/22/2021 NONE DETECTED  NONE DETECTED Final   Cocaine 07/22/2021 POSITIVE (A)  NONE DETECTED Final   Benzodiazepines 07/22/2021 POSITIVE (A)  NONE DETECTED Final   Amphetamines 07/22/2021 NONE DETECTED  NONE DETECTED Final   Tetrahydrocannabinol 07/22/2021 NONE DETECTED  NONE DETECTED Final   Barbiturates 07/22/2021 NONE DETECTED  NONE DETECTED Final   Comment: (NOTE) DRUG SCREEN FOR MEDICAL PURPOSES ONLY.  IF CONFIRMATION IS NEEDED FOR ANY PURPOSE, NOTIFY LAB WITHIN 5 DAYS.  LOWEST DETECTABLE LIMITS FOR URINE DRUG SCREEN Drug Class                     Cutoff (ng/mL) Amphetamine and metabolites    1000 Barbiturate and metabolites    200 Benzodiazepine                 200 Tricyclics and metabolites     300 Opiates and metabolites        300 Cocaine and metabolites        300 THC                            50 Performed at [facility], [address], [place], [place] [address]   Admission on 07/18/2021, Discharged on 07/18/2021  Component Date Value Ref Range Status   SARS Coronavirus 2 by RT PCR 07/18/2021 NEGATIVE  NEGATIVE Final   Comment: (NOTE) SARS-CoV-2 target nucleic acids are NOT DETECTED.  The SARS-CoV-2 RNA is generally detectable in upper respiratory specimens during the acute phase of infection. The lowest concentration of SARS-CoV-2 viral copies this assay can detect is 138 copies/mL. A negative result does not preclude SARS-Cov-2 infection and should not be used as the sole basis for treatment or other patient management decisions. A negative result may occur with  improper specimen collection/handling, submission of specimen other than nasopharyngeal swab, presence of viral mutation(s) within the areas targeted by this assay, and inadequate number of viral copies(<138 copies/mL). A negative result must be combined with clinical observations, patient history, and  epidemiological information. The expected result is Negative.  Fact Sheet for Patients:  BloggerCourse.com  Fact Sheet for Healthcare Providers:  SeriousBroker.it  This test is no                          t yet approved or cleared by the [place] FDA and  has been authorized for detection and/or diagnosis of SARS-CoV-2 by FDA under an Emergency Use Authorization (EUA). This EUA will remain  in effect (meaning this test can be used) for the duration of the COVID-19 declaration under Section 564(b)(1) of the Act, 21 U.S.C.section 360bbb-3(b)(1), unless the authorization is terminated  or revoked sooner.       Influenza A by PCR 07/18/2021 NEGATIVE  NEGATIVE Final   Influenza B by PCR 07/18/2021 NEGATIVE  NEGATIVE Final   Comment: (NOTE) The Xpert Xpress SARS-CoV-2/FLU/RSV plus assay is intended as an aid in the diagnosis of influenza from Nasopharyngeal swab specimens and should not be used as a sole basis for treatment. Nasal washings and aspirates are unacceptable for Xpert Xpress SARS-CoV-2/FLU/RSV testing.  Fact Sheet for Patients: BloggerCourse.com  Fact Sheet for Healthcare Providers: SeriousBroker.it  This test is not yet approved or  cleared by the [place] and has been authorized for detection and/or diagnosis of SARS-CoV-2 by FDA under an Emergency Use Authorization (EUA). This EUA will remain in effect (meaning this test can be used) for the duration of the COVID-19 declaration under Section 564(b)(1) of the Act, 21 U.S.C. section 360bbb-3(b)(1), unless the authorization is terminated or revoked.  Performed at [facility], [address] [place], [place] [address]    Sodium 07/18/2021 139  135 - 145 mmol/L Final   Potassium 07/18/2021 3.0 (L)  3.5 - 5.1 mmol/L Final   Chloride 07/18/2021 108  98 - 111 mmol/L Final   CO2  07/18/2021 23  22 - 32 mmol/L Final   Glucose, Bld 07/18/2021 136 (H)  70 - 99 mg/dL Final   Glucose reference range applies only to samples taken after fasting for at least 8 hours.   BUN 07/18/2021 <5 (L)  6 - 20 mg/dL Final   Creatinine, Ser 07/18/2021 0.61  0.61 - 1.24 mg/dL Final   Calcium 60/45/4098 7.4 (L)  8.9 - 10.3 mg/dL Final   Total Protein 11/91/4782 7.0  6.5 - 8.1 g/dL Final   Albumin 95/62/1308 3.8  3.5 - 5.0 g/dL Final   AST 65/78/4696 31  15 - 41 U/L Final   ALT 07/18/2021 21  0 - 44 U/L Final   Alkaline Phosphatase 07/18/2021 134 (H)  38 - 126 U/L Final   Total Bilirubin 07/18/2021 0.5  0.3 - 1.2 mg/dL Final   GFR, Estimated 07/18/2021 >60  >60 mL/min Final   Comment: (NOTE) Calculated using the CKD-EPI Creatinine Equation (2021)    Anion gap 07/18/2021 8  5 - 15 Final   Performed at [facility], [address] [place], [place] [address]   Alcohol, Ethyl (B) 07/18/2021 171 (H)  <10 mg/dL Final   Comment: (NOTE) Lowest detectable limit for serum alcohol is 10 mg/dL.  For medical purposes only. Performed at [facility], [address] [place], [place] [address]    WBC 07/18/2021 5.0  4.0 - 10.5 K/uL Final   RBC 07/18/2021 5.03  4.22 - 5.81 MIL/uL Final   Hemoglobin 07/18/2021 13.5  13.0 - 17.0 g/dL Final   HCT 40/06/2724 42.2  39.0 - 52.0 % Final   MCV 07/18/2021 83.9  80.0 - 100.0 fL Final   MCH 07/18/2021 26.8  26.0 - 34.0 pg Final   MCHC 07/18/2021 32.0  30.0 - 36.0 g/dL Final   RDW 36/64/4034 16.7 (H)  11.5 - 15.5 % Final   Platelets 07/18/2021 236  150 - 400 K/uL Final   nRBC 07/18/2021 0.0  0.0 - 0.2 % Final   Neutrophils Relative % 07/18/2021 53  % Final   Neutro Abs 07/18/2021 2.6  1.7 - 7.7 K/uL Final   Lymphocytes Relative 07/18/2021 35  % Final   Lymphs Abs 07/18/2021 1.8  0.7 - 4.0 K/uL Final   Monocytes Relative 07/18/2021 9  % Final   Monocytes Absolute 07/18/2021 0.5  0.1 - 1.0 K/uL Final   Eosinophils  Relative 07/18/2021 2  % Final   Eosinophils Absolute 07/18/2021 0.1  0.0 - 0.5 K/uL Final   Basophils Relative 07/18/2021 1  % Final   Basophils Absolute 07/18/2021 0.0  0.0 - 0.1 K/uL Final   Immature Granulocytes 07/18/2021 0  % Final   Abs Immature Granulocytes 07/18/2021 0.02  0.00 - 0.07 K/uL Final   Performed at [facility], [address] [place], [place] [address]   Acetaminophen (Tylenol), Serum 07/18/2021 <10 (L)  10 - 30 ug/mL Final   Comment: (NOTE) Therapeutic concentrations vary significantly. A range of 10-30 ug/mL  may be an effective concentration for many patients. However, some  are best treated at concentrations outside of this range. Acetaminophen concentrations >150 ug/mL at 4 hours after ingestion  and >50 ug/mL at 12 hours after ingestion are often associated with  toxic reactions.  Performed at [facility], [address], [place], [place] [address]    Salicylate Lvl 07/18/2021 <7.0 (L)  7.0 - 30.0 mg/dL Final   Performed at [facility], [address] [place], [place] [address]  Admission on 06/24/2021, Discharged on 06/25/2021  Component Date Value Ref Range Status   WBC 06/24/2021 5.6  4.0 - 10.5 K/uL Final   RBC 06/24/2021 5.04  4.22 - 5.81 MIL/uL Final   Hemoglobin 06/24/2021 13.2  13.0 - 17.0 g/dL Final   HCT 13/04/6577 41.7  39.0 - 52.0 % Final   MCV 06/24/2021 82.7  80.0 - 100.0 fL Final   MCH 06/24/2021 26.2  26.0 - 34.0 pg Final   MCHC 06/24/2021 31.7  30.0 - 36.0 g/dL Final   RDW 46/96/2952 14.6  11.5 - 15.5 % Final   Platelets 06/24/2021 261  150 - 400 K/uL Final   nRBC 06/24/2021 0.0  0.0 - 0.2 % Final   Neutrophils Relative % 06/24/2021 44  % Final   Neutro Abs 06/24/2021 2.5  1.7 - 7.7 K/uL Final   Lymphocytes Relative 06/24/2021 38  % Final   Lymphs Abs 06/24/2021 2.1  0.7 - 4.0 K/uL Final   Monocytes Relative 06/24/2021 11  % Final   Monocytes Absolute 06/24/2021 0.6  0.1 -  1.0 K/uL Final   Eosinophils Relative 06/24/2021 5  % Final   Eosinophils Absolute 06/24/2021 0.3  0.0 - 0.5 K/uL Final   Basophils Relative 06/24/2021 1  % Final   Basophils Absolute 06/24/2021 0.0  0.0 - 0.1 K/uL Final   Immature Granulocytes 06/24/2021 1  % Final   Abs Immature Granulocytes 06/24/2021 0.04  0.00 - 0.07 K/uL Final   Performed at [facility], [address], [place], [place] [address]   Sodium 06/24/2021 140  135 - 145 mmol/L Final   Potassium 06/24/2021 3.2 (L)  3.5 - 5.1 mmol/L Final   Chloride 06/24/2021 104  98 - 111 mmol/L Final   CO2 06/24/2021 25  22 - 32 mmol/L Final   Glucose, Bld 06/24/2021 103 (H)  70 - 99 mg/dL Final   Glucose reference range applies only to samples taken after fasting for at least 8 hours.   BUN 06/24/2021 5 (L)  6 - 20 mg/dL Final   Creatinine, Ser 06/24/2021 0.63  0.61 - 1.24 mg/dL Final   Calcium 44/09/270 8.0 (L)  8.9 - 10.3 mg/dL Final   Total Protein 53/66/4403 6.8  6.5 - 8.1 g/dL Final   Albumin 47/42/5956 3.5  3.5 - 5.0 g/dL Final   AST 38/75/6433 25  15 - 41 U/L Final   ALT 06/24/2021 14  0 - 44 U/L Final   Alkaline Phosphatase 06/24/2021 121  38 - 126 U/L Final   Total Bilirubin 06/24/2021 0.4  0.3 - 1.2 mg/dL Final   GFR, Estimated 06/24/2021 >60  >60 mL/min Final   Comment: (NOTE) Calculated using the CKD-EPI Creatinine Equation (2021)    Anion gap 06/24/2021 11  5 - 15  Final   Performed at [facility], [address], [place], [place] [address]   Alcohol, Ethyl (B) 06/24/2021 87 (H)  <10 mg/dL Final   Comment: (NOTE) Lowest detectable limit for serum alcohol is 10 mg/dL.  For medical purposes only. Performed at [facility], [address], [place], [place] [address]    Opiates 06/24/2021 NONE DETECTED  NONE DETECTED Final   Cocaine 06/24/2021 NONE DETECTED  NONE DETECTED Final   Benzodiazepines 06/24/2021 NONE DETECTED  NONE DETECTED Final   Amphetamines 06/24/2021 NONE DETECTED  NONE DETECTED  Final   Tetrahydrocannabinol 06/24/2021 NONE DETECTED  NONE DETECTED Final   Barbiturates 06/24/2021 NONE DETECTED  NONE DETECTED Final   Comment: (NOTE) DRUG SCREEN FOR MEDICAL PURPOSES ONLY.  IF CONFIRMATION IS NEEDED FOR ANY PURPOSE, NOTIFY LAB WITHIN 5 DAYS.  LOWEST DETECTABLE LIMITS FOR URINE DRUG SCREEN Drug Class                     Cutoff (ng/mL) Amphetamine and metabolites    1000 Barbiturate and metabolites    200 Benzodiazepine                 200 Tricyclics and metabolites     300 Opiates and metabolites        300 Cocaine and metabolites        300 THC                            50 Performed at [name], [address], [place], [place] [address]    SARS Coronavirus 2 by RT PCR 06/25/2021 NEGATIVE  NEGATIVE Final   Comment: (NOTE) SARS-CoV-2 target nucleic acids are NOT DETECTED.  The SARS-CoV-2 RNA is generally detectable in upper respiratory specimens during the acute phase of infection. The lowest concentration of SARS-CoV-2 viral copies this assay can detect is 138 copies/mL. A negative result does not preclude SARS-Cov-2 infection and should not be used as the sole basis for treatment or other patient management decisions. A negative result may occur with  improper specimen collection/handling, submission of specimen other than nasopharyngeal swab, presence of viral mutation(s) within the areas targeted by this assay, and inadequate number of viral copies(<138 copies/mL). A negative result must be combined with clinical observations, patient history, and epidemiological information. The expected result is Negative.  Fact Sheet for Patients:  BloggerCourse.com  Fact Sheet for Healthcare Providers:  SeriousBroker.it  This test is no                          t yet approved or cleared by the [place] FDA and  has been authorized for detection and/or diagnosis of SARS-CoV-2 by FDA under an  Emergency Use Authorization (EUA). This EUA will remain  in effect (meaning this test can be used) for the duration of the COVID-19 declaration under Section 564(b)(1) of the Act, 21 U.S.C.section 360bbb-3(b)(1), unless the authorization is terminated  or revoked sooner.       Influenza A by PCR 06/25/2021 NEGATIVE  NEGATIVE Final   Influenza B by PCR 06/25/2021 NEGATIVE  NEGATIVE Final   Comment: (NOTE) The Xpert Xpress SARS-CoV-2/FLU/RSV plus assay is intended as an aid in the diagnosis of influenza from Nasopharyngeal swab specimens and should not be used as a sole basis for treatment. Nasal washings and aspirates are unacceptable for Xpert Xpress SARS-CoV-2/FLU/RSV testing.  Fact Sheet for Patients: BloggerCourse.com  Fact Sheet for Healthcare Providers: SeriousBroker.it  This test is not yet approved or cleared by the [place] FDA and has been authorized for detection and/or diagnosis of SARS-CoV-2 by FDA under an Emergency Use Authorization (EUA). This EUA will remain in effect (meaning this test can be used) for the duration of the COVID-19 declaration under Section 564(b)(1) of the Act, 21 U.S.C. section 360bbb-3(b)(1), unless the authorization is terminated or revoked.  Performed at [facility], 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 -review labs results -CIWA and librium protocol for alcohol withdraw symptom management  Recommendations  Based on my evaluation the patient does not appear to have an emergency medical condition.  Maricela Bo, NP 08/31/21  4:38 AM
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
## 2021-08-31 NOTE — ED Notes (Signed)
Patient discharged to [facility] in [place]. Patient received After Visit Summary with follow up instructions. All belongings from [facility] locker returned to patient. Patient denies SI , HI and AVH at time of discharge.

## 2021-08-31 NOTE — Progress Notes (Signed)
CSW attempted to contact the brother of the patient, however it was unsuccessful.The recording stated the phone number provided [phone] is not accepting calls right now.   Crissie Reese, MSW, LCSW-A, LCAS-A Phone: [phone] Disposition/TOC

## 2021-08-31 NOTE — ED Notes (Signed)
Pt is currently sleeping, no distress noted, environmental check complete, will continue to monitor patient for safety. ? ?

## 2021-08-31 NOTE — Progress Notes (Signed)
Received report from RN.Patient currently resting, eyes are closed and respirations are even and unlabored. No objective signs of distress. Nursing staff will continue to monitor.

## 2021-08-31 NOTE — Progress Notes (Signed)
Patient has spoken with social worker, had breakfast and is now resting again on obs unit. Patient has no objective signs of acute distress. Medications given. Nursing staff will continue to monitor.

## 2021-08-31 NOTE — Discharge Instructions (Addendum)
Substance Abuse Resources ? ? ?[facility] ?- Admissions are currently completed Monday through Friday at 8am; both appointments and walk-ins are accepted.  Any individual that is a [place] resident may present for a substance abuse screening and assessment for admission.  A person may be referred by numerous sources or self-refer.   Potential clients will be screened for medical necessity and appropriateness for the program.  Clients must meet criteria for high-intensity residential treatment services.  If clinically appropriate, a client will continue with the comprehensive clinical assessment and intake process, as well as enrollment in the [facility]. ? ?Address: [address] ?[place], [place] [address] ?Admin Hours: Mon-Fri 8AM to 5PM ?Center Hours: 24/7 ?Phone: [phone] ?Fax: [phone] ? ?[facility] (Detox) Facility Based Crisis:  ?These are 3 locations for services: Please call before arrival:   ? ?[facility] ([facility])  ?Address: [address] [place] , [place] [address] ?Phone: [phone] ? ?[facility] ([facility]) ?Address: [address], [place], [place] [address] ?Phone#: [phone] ? ?[facility] ([facility]) ?Address: [address], [place], [place] [address] ?Phone#: [phone] ? ? ?[facility] ([facility]): (offers outpatient therapy and intensive outpatient substance abuse therapy).  ?[address], [place], [place] [address] ?Phone: [phone] ? ?[facility] ?Men's Division ?Address: [address] [place], [place] [address] ?Phone: [phone] ? ?-The [facility] provides food, shelter and other programs and services to the homeless men of [place] through our men's program. ? ?By offering safe shelter, three meals a day, clean clothing, Biblical counseling, financial planning, vocational training, GED/education and  employment assistance, we've helped mend the shattered lives of many homeless men since opening in 1974. ? ?We have approximately 267 beds available, with a max of 312 beds including mats for emergency situations and currently house an average of 270 men a night. ? ?Prospective Client Check-In Information ?Photo ID Required (State/ Out of State/ DOC) - if photo ID is not available, clients are required to have a printout of a police/sheriff's criminal history report. ?Help out with chores around the Mission. ?No sex offender of any type (pending, charged, registered and/or any other sex related offenses) will be permitted to check in. ?Must be willing to abide by all rules, regulations, and policies established by the [facility]. ?The following will be provided - shelter, food, clothing, and biblical counseling. ?If you or someone you know is in need of assistance at our men's shelter in [place], [place], please call [phone] ext. [phone]. ? ?[facility] intake hours are Monday-Friday only.  ? ?[facility]: ? ? Phone: [phone] ? ?[facility] ?SA Intensive Outpatient Program (SAIOP) means structured individual and group addiction activities and services that are provided at an outpatient program designed to assist adult and adolescent consumers to begin recovery and learn skills for recovery maintenance. The [facility] SAIOP program is offered at least 3 hours a day, 3 days a week. SAIOP services shall include a structured program consisting of, but not limited to, the following services: ?Individual counseling and support; Group counseling and support; Family counseling, training or support; Biochemical assays to identify recent drug use (e.g., urine drug screens); Strategies for relapse prevention to include community and social support systems in treatment; Life skills; Crisis contingency planning; Disease Management; and Treatment  support activities that have been adapted or   specifically designed for persons with physical disabilities, or persons with co-occurring disorders of mental illness and substance abuse/dependence or mental retardation/developmental disability and substance abuse/dependence.  Phone: [phone]   [facility])  Address: [address] [place], [place], [place] [address] Phone: [phone]   [facility] Address: [address] [place], [place] [address] Phone: [phone]  - a combination of group and individual sessions to meet the participants needs. This allows participants to engage in treatment and remain involved in their home and work life. - Transitional housing places program participants in a supportive living environment while they complete a treatment program and work to secure independent housing. - The Substance Abuse Intensive Outpatient Treatment Program at [employer] consists of structured group sessions and individual sessions that are designed to teach participants early recovery and relapse prevention skills. -[facility] works with the [employer] to provide a housing and treatment program for homeless veterans.   [employer]idential [employer], [employer].   Address: [address] [place], [place] [address] Phone#: [phone]   : Referrals to RTSA facilities can be made by [employer] and [facility].  Referrals are also accepted from physicians, private providers, hospital emergency rooms, family members, or any person who has knowledge of someone in the need of our services.  [facility] will also offer the following outpatient services: (Monday through Friday 8am-5pm)   Partial Hospitalization Program (PHP) Substance Abuse Intensive Outpatient Program (SA-IOP) Group Therapy Medication Management Peer Living Room We also provide (24/7):  Assessments: Our mental health  clinician and providers will conduct a focused mental health evaluation, assessing for immediate safety concerns and further mental health needs. Referral: Our team will provide resources and help connect to community based mental health treatment, when indicated, including psychotherapy, psychiatry, and other specialized behavioral health or substance use disorder services (for those not already in treatment). Transitional Care: Our team providers in person bridging and/or telephonic follow-up during the patient's transition to outpatient services.   [facility] 24-Hour Call Center: [phone] Behavioral Health Crisis Line: [phone]

## 2021-08-31 NOTE — Progress Notes (Signed)
CSW provided the following resources for the patient to utilize:   Substance Abuse Resources   [facility] - Admissions are currently completed Monday through Friday at [facility]; both appointments and walk-ins are accepted.  Any individual that is a [facility] resident may present for a substance abuse screening and assessment for admission.  A person may be referred by numerous sources or self-refer.   Potential clients will be screened for medical necessity and appropriateness for the program.  Clients must meet criteria for high-intensity residential treatment services.  If clinically appropriate, a client will continue with the comprehensive clinical assessment and intake process, as well as enrollment in the [place].  Address: [address] [name], [place] [address] Admin Hours: Mon-Fri 8AM to [place] Hours: 24/7 Phone: [phone] Fax: [phone]  [facility] (Detox) Facility Based Crisis:  These are 3 locations for services: Please call before arrival:    [facility])  Address: [place] [place] [address] [place], [place] [address] Phone: [phone]  [facility]) Address: [address] [place], [place] [address] Phone#: [phone]  [facility]) Address: [address] [place], [place] [address] Phone#: [phone]   [facility] ([facility]): (offers outpatient therapy and intensive outpatient substance abuse therapy).  [address], [place], [place] [address] Phone: [phone]  [facility] Address: [address] [place], [place] [address] Phone: [phone]  -[facility] provides food, shelter and other programs and services to the homeless men of [place] through our [employer] program.  By offering safe shelter, three meals a day, clean clothing, Biblical  counseling, financial planning, vocational training, GED/education and employment assistance, we've helped mend the shattered lives of many homeless men since opening in [place].  We have approximately 267 beds available, with a max of 312 beds including mats for emergency situations and currently house an average of 270 men a night.  Prospective Client Check-In Information Photo ID Required (State/ Out of State/ [facility]) - if photo ID is not available, clients are required to have a printout of a police/sheriff's criminal history report. Help out with chores around the [place]. No sex offender of any type (pending, charged, registered and/or any other sex related offenses) will be permitted to check in. Must be willing to abide by all rules, regulations, and policies established by the [employer]. The following will be provided - shelter, food, clothing, and biblical counseling. If you or someone you know is in need of assistance at our [facility] shelter in [place], [place], please call [phone] ext. [phone].  [facility] [employer] hours are Monday-Friday only.   [facility]:   Phone: [phone]  The [facility] SA Intensive Outpatient Program [facility]) means structured individual and group addiction activities and services that are provided at an outpatient program designed to assist adult and adolescent consumers to begin recovery and learn skills for recovery maintenance. The [place] program is offered at least 3 hours a day, 3 days a week. SAIOP services shall include a structured program consisting of, but not limited to, the following services: Individual counseling and support; Group counseling and support; Family counseling, training or support; Biochemical assays to identify recent drug use (e.g., urine drug screens); Strategies for relapse prevention to include community and social support systems in treatment; Life  skills; Crisis contingency  planning; Disease Management; and Treatment support activities that have been adapted or specifically designed for persons with physical disabilities, or persons with co-occurring disorders of mental illness and substance abuse/dependence or mental retardation/developmental disability and substance abuse/dependence.  Phone: [phone]   [facility])  Address: [address] [place], [place], [place] [address] Phone: [phone]   [facility] Address: [address], [place], [place] [address] Phone: [phone]  - a combination of group and individual sessions to meet the participants needs. This allows participants to engage in treatment and remain involved in their home and work life. - Transitional housing places program participants in a supportive living environment while they complete a treatment program and work to secure independent housing. - The Substance Abuse Intensive Outpatient Treatment Program at [employer] consists of structured group sessions and individual sessions that are designed to teach participants early recovery and relapse prevention skills. -[facility] works with the [employer] to provide a housing and treatment program for homeless veterans.   [facility], [employer].   Address: [address] [place], [place] [address] Phone#: [phone]   : Referrals to RTSA facilities can be made by [employer] and [facility].  Referrals are also accepted from physicians, private providers, hospital emergency rooms, family members, or any person who has knowledge of someone in the need of our services.  [facility] will also offer the following outpatient services: (Monday through Friday 8am-5pm)   Partial Hospitalization Program (PHP) Substance Abuse Intensive Outpatient Program (SA-IOP) Group Therapy Medication Management Peer Living  Room We also provide (24/7):  Assessments: Our mental health clinician and providers will conduct a focused mental health evaluation, assessing for immediate safety concerns and further mental health needs. Referral: Our team will provide resources and help connect to community based mental health treatment, when indicated, including psychotherapy, psychiatry, and other specialized behavioral health or substance use disorder services (for those not already in treatment). Transitional Care: Our team providers in person bridging and/or telephonic follow-up during the patient's transition to outpatient services.   [facility] 24-Hour Call Center: [phone] Behavioral Health Crisis Line: [phone]   Crissie Reese, MSW, LCSW-A, [place] Phone: [phone] Disposition/TOC

## 2021-09-01 NOTE — ED Notes (Signed)
Pt called back in to the facility from the [facility] upset stating that he was sent to [facility] in [place] with out prescription samples and as a result he was discharged from [facility]. Upon discharge the patient went to [facility] and states that he was told by the provider that he would be discharged from the hospital with in a couple of hours after he is seen by the provider. The patient wanted a ride back to [place] as he is stating he is stuck in [place] with no ride back to [place]. This nurse as well as Baldo Ash [facility]) advised the patient that we unfortunately  are unable to provide him with a ride back to [place].The patient then stated that this was not his fault and asked why would we send him to [facility] with out prescriptions knowing that a patient has to have samples and that [facility] not give medications. After reviewing the chart, we advised the patient that per documentation by Provider Cinderella he declined the medications stating he did not like the way they made him feel. The patient stated he would not decline medications that he knows he would have needed to get him into [facility]. Baldo Ash advised the patient she would pass his information along to his supervisor and have her to look into it and reach back out to him. The patient stated that he would have his brother to bring him up to the facility on tomorrow to speak with someone in charge as he feels that's he was done wrong.   This note was documented  @ 2332/ January 1st 2023

## 2021-09-02 NOTE — ED Notes (Signed)
09/01/21  °

## 2021-09-02 NOTE — ED Notes (Signed)
010/10/2021 0015  Patient called again repeating his story. This nurse told patient there was "absolutely nothing" we could do tonight and if he kept calling, we would have to transfer to security  Patient hung up  [facility]

## 2021-09-02 NOTE — ED Notes (Signed)
2340 09/01/2021 Patient called again.  Repeated his complaints of not getting medication sent with him to [facility]. 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 Advised again to come to [facility] tomorrow am and speak to someone at [facility].  Patient kept repeating his complaints until this nurse said we were unable to help him tonight and we would have to address it in the am.  Patient advised to end call as there was nothing we could do but relay information to the proper individuals.  Patient hung up  [facility] 2340 09/01/2021

## 2021-09-12 ENCOUNTER — Emergency Department (HOSPITAL_COMMUNITY)
Admission: EM | Admit: 2021-09-12 | Discharge: 2021-09-13 | Disposition: A | Discharge status: Home / Self Care | Payer: Medicare Other | Attending: Emergency Medicine | Admitting: Emergency Medicine

## 2021-09-12 ENCOUNTER — Other Ambulatory Visit: Payer: Self-pay

## 2021-09-12 ENCOUNTER — Telehealth (HOSPITAL_COMMUNITY): Payer: Self-pay

## 2021-09-12 ENCOUNTER — Emergency Department (HOSPITAL_COMMUNITY): Payer: Medicare Other

## 2021-09-12 DIAGNOSIS — R21 Rash and other nonspecific skin eruption: Secondary | ICD-10-CM | POA: Diagnosis not present

## 2021-09-12 DIAGNOSIS — Z760 Encounter for issue of repeat prescription: Secondary | ICD-10-CM | POA: Insufficient documentation

## 2021-09-12 DIAGNOSIS — R519 Headache, unspecified: Secondary | ICD-10-CM | POA: Insufficient documentation

## 2021-09-12 DIAGNOSIS — Z79899 Other long term (current) drug therapy: Secondary | ICD-10-CM | POA: Diagnosis not present

## 2021-09-12 DIAGNOSIS — R079 Chest pain, unspecified: Secondary | ICD-10-CM | POA: Insufficient documentation

## 2021-09-12 DIAGNOSIS — I10 Essential (primary) hypertension: Secondary | ICD-10-CM | POA: Diagnosis not present

## 2021-09-12 DIAGNOSIS — F1910 Other psychoactive substance abuse, uncomplicated: Secondary | ICD-10-CM

## 2021-09-12 LAB — BASIC METABOLIC PANEL
Anion gap: 8 (ref 5–15)
BUN: 7 mg/dL (ref 6–20)
CO2: 27 mmol/L (ref 22–32)
Calcium: 8.5 mg/dL — ABNORMAL LOW (ref 8.9–10.3)
Chloride: 103 mmol/L (ref 98–111)
Creatinine, Ser: 0.8 mg/dL (ref 0.61–1.24)
GFR, Estimated: >60 mL/min (ref >60)
Glucose, Bld: 91 mg/dL (ref 70–99)
Potassium: 3.7 mmol/L (ref 3.5–5.1)
Sodium: 138 mmol/L (ref 135–145)

## 2021-09-12 LAB — ETHANOL: Alcohol, Ethyl (B): <10 mg/dL (ref <10)

## 2021-09-12 LAB — CBC
HCT: 40.9 % (ref 39.0–52.0)
Hemoglobin: 13.1 g/dL (ref 13.0–17.0)
MCH: 27.4 pg (ref 26.0–34.0)
MCHC: 32 g/dL (ref 30.0–36.0)
MCV: 85.6 fL (ref 80.0–100.0)
Platelets: 263 10*3/uL (ref 150–400)
RBC: 4.78 MIL/uL (ref 4.22–5.81)
RDW: 15.6 % — ABNORMAL HIGH (ref 11.5–15.5)
WBC: 6 10*3/uL (ref 4.0–10.5)
nRBC: 0 % (ref 0.0–0.2)

## 2021-09-12 LAB — TROPONIN I (HIGH SENSITIVITY): Troponin I (High Sensitivity): 6 ng/L (ref <18)

## 2021-09-12 MED ORDER — LORAZEPAM 1 MG PO TABS
0.0000 mg | ORAL_TABLET | Freq: Four times a day (QID) | ORAL | Status: DC
  Administered 2021-09-12: 2 mg via ORAL
  Filled 2021-09-12: qty 4

## 2021-09-12 MED ORDER — LORAZEPAM 2 MG/ML IJ SOLN: 0.0000 mg | Freq: Two times a day (BID) | INTRAMUSCULAR | Status: DC

## 2021-09-12 MED ORDER — THIAMINE HCL 100 MG PO TABS
100.0000 mg | ORAL_TABLET | Freq: Every day | ORAL | Status: DC
  Administered 2021-09-13: 100 mg via ORAL
  Filled 2021-09-12: qty 1

## 2021-09-12 MED ORDER — LORAZEPAM 2 MG/ML IJ SOLN: 0.0000 mg | Freq: Four times a day (QID) | INTRAMUSCULAR | Status: DC

## 2021-09-12 MED ORDER — THIAMINE HCL 100 MG/ML IJ SOLN: 100.0000 mg | Freq: Every day | INTRAMUSCULAR | Status: DC

## 2021-09-12 MED ORDER — LORAZEPAM 1 MG PO TABS: 0.0000 mg | ORAL_TABLET | Freq: Two times a day (BID) | ORAL | Status: DC

## 2021-09-12 NOTE — BH Assessment (Signed)
Care Management - [facility]UC Follow Up Discharges   Writer attempted to make contact with patient today and was unsuccessful.  Writer left a HIPPA compliant voice message.   Per chart review, patient was provided will follow up with [facility] in [employer] and his established  ACTT services through [facility]

## 2021-09-12 NOTE — ED Provider Triage Note (Signed)
Emergency Medicine Provider Triage Evaluation Note  [name] , a 51 y.o. male  was evaluated in triage.  Pt complains of chest pain and ETOH withdrawal. Patient started having chest pain about 10 min after snorting cocaine. It is in his right chest and does not radiate. He is diaphoretic and has some tingling sensations in his arms.  He also states that he is likely withdrawing from alcohol. He has bilateral tremors and is diaphoretic. Last drink was one hour ago. No hallucinations. He has drank "2 bootleggers" today.  Review of Systems  Positive:  Negative:   Physical Exam  BP (!) 132/96 (BP Location: Left Arm)    Pulse 79    Temp 98.1 F (36.7 C) (Oral)    Resp 17    SpO2 100%  Gen:   Awake, no distress   Resp:  Normal effort  MSK:   Moves extremities without difficulty  Other:  Diaphoretic, tremorous  Medical Decision Making  Medically screening exam initiated at 9:23 PM.  Appropriate orders placed.  [name] was informed that the remainder of the evaluation will be completed by another provider, this initial triage assessment does not replace that evaluation, and the importance of remaining in the ED until their evaluation is complete.  CIWA protocol initiated. CP workup.   Claudie Leach, [place] 09/12/21 2125

## 2021-09-12 NOTE — ED Triage Notes (Addendum)
Pt brought to ED triage via wheelchair by [facility] with c/o right sided chest pain and withdrawals. Pt states he left treatment center for EOTH abuse today in [place]. Has drank "two bootleggers" and endorses cocaine use. Pt notably drenched in sweat with tremors during time of triage. CIWA score 21 during initial assessment.

## 2021-09-12 NOTE — ED Provider Notes (Signed)
[facility] EMERGENCY DEPARTMENT Provider Note   CSN: [id number] Arrival date & time: 09/12/21  2114     History  Chief Complaint  Patient presents with   Chest Pain   Withdrawal    [name] is a 51 y.o. male past medical history significant for hypertension, obesity, PTSD, alcohol use disorder, depression, cocaine use, bipolar who presents to the emergency department complaining of right-sided chest pain, rash drawls.  Patient was just seen at a treatment center in [facility] reports that he was inpatient for 4 days, was receiving treatment for alcohol abuse.  Patient reports that he was discharged for behavioral reasons, and has since relapsed on his alcohol abuse, drinking "2 bootleggers", as well as some crack cocaine prior to arrival.  Patient arrives with concern for active withdrawal, as well as chest pain.  Patient reports this chest pain is been present for over a month, is worse with exertion.  Patient denies radiation to the neck, arms.  Patient does endorse first-degree family history of ACS.  He denies nausea, vomiting.   Chest Pain Associated symptoms: headache       Home Medications Prior to Admission medications   Medication Sig Start Date End Date Taking? Authorizing Provider  albuterol (VENTOLIN HFA) 108 (90 Base) MCG/ACT inhaler Inhale 2 puffs into the lungs every 6 (six) hours as needed for wheezing or shortness of breath.    [provider]  amLODipine (NORVASC) 5 MG tablet Take 1 tablet (5 mg total) by mouth daily. 09/05/20   Davonna Belling, MD  cloNIDine (CATAPRES) 0.2 MG tablet Take 1 tablet (0.2 mg total) by mouth 3 (three) times daily as needed. 09/05/20   Davonna Belling, MD  doxepin (SINEQUAN) 10 MG capsule Take 1 capsule (10 mg total) by mouth at bedtime. 09/05/20   Davonna Belling, MD  escitalopram (LEXAPRO) 10 MG tablet Take 10 mg by mouth daily.    [provider]  hydrOXYzine (ATARAX/VISTARIL)  50 MG tablet Take 1 tablet (50 mg total) by mouth 3 (three) times daily as needed for anxiety. 09/05/20   Davonna Belling, MD      Allergies    Metoprolol    Review of Systems   Review of Systems  Cardiovascular:  Positive for chest pain.  Neurological:  Positive for headaches.  All other systems reviewed and are negative.  Physical Exam Updated Vital Signs BP (!) 132/96 (BP Location: Left Arm)    Pulse 79    Temp 98.1 F (36.7 C) (Oral)    Resp 17    Ht 5\' 6"  (1.676 m)    Wt 113 kg    SpO2 100%    BMI 40.21 kg/m  Physical Exam Vitals and nursing note reviewed.  Constitutional:      General: He is not in acute distress.    Appearance: Normal appearance. He is obese.  HENT:     Head: Normocephalic and atraumatic.  Eyes:     General:        Right eye: No discharge.        Left eye: No discharge.  Cardiovascular:     Rate and Rhythm: Normal rate and regular rhythm.     Heart sounds: No murmur heard.   No friction rub. No gallop.  Pulmonary:     Effort: Pulmonary effort is normal.     Breath sounds: Normal breath sounds.  Abdominal:     General: Bowel sounds are normal.     Palpations:  Abdomen is soft.     Comments: No significant tenderness to palpation abdomen  Skin:    General: Skin is warm and dry.     Capillary Refill: Capillary refill takes less than 2 seconds.     Comments: Significant scarring over much of skin surface.  No active wounds, lesions.  Patient is not excessively diaphoretic, exam.  Neurological:     Mental Status: He is alert and oriented to person, place, and time.  Psychiatric:        Mood and Affect: Mood normal.        Behavior: Behavior normal.    ED Results / Procedures / Treatments   Labs (all labs ordered are listed, but only abnormal results are displayed) Labs Reviewed  BASIC METABOLIC PANEL - Abnormal; Notable for the following components:      Result Value   Calcium 8.5 (*)    All other components within normal limits  CBC -  Abnormal; Notable for the following components:   RDW 15.6 (*)    All other components within normal limits  ETHANOL  TROPONIN I (HIGH SENSITIVITY)    EKG None  Radiology DG Chest Port 1 View  Result Date: 09/12/2021 CLINICAL DATA:  Right-sided chest pain. EXAM: PORTABLE CHEST 1 VIEW COMPARISON:  PA Lat 08/30/2021. FINDINGS: The heart is moderately enlarged. There is slight central vascular distension without edema. The lungs hyperexpanded but clear. The sulci are sharp. Stable mediastinum with aortic ectasia and moderate tortuosity. Thoracic spondylosis and slight dextroscoliosis. IMPRESSION: No evidence of acute chest disease.  Stable chest with cardiomegaly. Electronically Signed   By: Telford Nab M.D.   On: 09/12/2021 22:01    Procedures Procedures    Medications Ordered in ED Medications  LORazepam (ATIVAN) injection 0-4 mg ( Intravenous See Alternative 09/12/21 2250)    Or  LORazepam (ATIVAN) tablet 0-4 mg (2 mg Oral Given 09/12/21 2250)  LORazepam (ATIVAN) injection 0-4 mg (has no administration in time range)    Or  LORazepam (ATIVAN) tablet 0-4 mg (has no administration in time range)  thiamine tablet 100 mg (has no administration in time range)    Or  thiamine (B-1) injection 100 mg (has no administration in time range)    ED Course/ Medical Decision Making/ A&P                           Medical Decision Making  Patient arrives with right-sided chest pain, and complaint of alcohol withdrawals.  In triage she was assessed as being CIWA score of 21.  I think that some of this may be objective, he is possibly slightly diaphoretic on exam, and minimally tremulous, however he is endorsing headache, hallucinations, anxiety.  His vital signs have been overall stable with occasional hypertension.  He is not tachycardic.  He is not febrile.  His highest blood pressure has been 0000000 systolic.  On my exam he is well-appearing.  My differential for his chest pain includes ACS,  cocaine induced cardiomyopathy, acute dissection, pneumonia, bronchitis, Boerhaave's versus other.  This is not an exhaustive differential.  I have obtained and reviewed lab work which is significant for unremarkable BMP, unremarkable CBC, his ethanol level is less than 10.  His initial troponin is 6.  We are pending delta troponins at this time.  We will continue to monitor him for signs of delirium tremens.  He has had Ativan x1 2 mg p.o. at this time.  12:37 AM  Care of [name] transferred to Dr. Tyrone Nine at the end of my shift as the patient will require reassessment once labs/imaging have resulted. Patient presentation, ED course, and plan of care discussed with review of all pertinent labs and imaging. Please see his/her note for further details regarding further ED course and disposition. Plan at time of handoff is to trend delta troponins, continue to monitor for signs of withdrawal, however minimal clinical suspicion for withdrawal.  If no evidence of acute delirium tremens, or ACS patient stable for discharge back to rehab facility. This may be altered or completely changed at the discretion of the oncoming team pending results of further workup.  Final Clinical Impression(s) / ED Diagnoses Final diagnoses:  None    Rx / DC Orders ED Discharge Orders     None         Anselmo Pickler, PA-C 09/13/21 0038    Deno Etienne, DO 09/13/21 0236

## 2021-09-13 ENCOUNTER — Emergency Department (HOSPITAL_COMMUNITY)
Admission: EM | Admit: 2021-09-13 | Discharge: 2021-09-13 | Disposition: A | Discharge status: Home / Self Care | Payer: Medicare Other | Source: Home / Self Care | Attending: Emergency Medicine | Admitting: Emergency Medicine

## 2021-09-13 ENCOUNTER — Encounter (HOSPITAL_COMMUNITY): Payer: Self-pay

## 2021-09-13 ENCOUNTER — Other Ambulatory Visit (HOSPITAL_COMMUNITY): Payer: Self-pay

## 2021-09-13 ENCOUNTER — Other Ambulatory Visit: Payer: Self-pay

## 2021-09-13 DIAGNOSIS — Z79899 Other long term (current) drug therapy: Secondary | ICD-10-CM | POA: Insufficient documentation

## 2021-09-13 DIAGNOSIS — R079 Chest pain, unspecified: Secondary | ICD-10-CM | POA: Diagnosis not present

## 2021-09-13 DIAGNOSIS — Z760 Encounter for issue of repeat prescription: Secondary | ICD-10-CM | POA: Insufficient documentation

## 2021-09-13 LAB — TROPONIN I (HIGH SENSITIVITY): Troponin I (High Sensitivity): 7 ng/L (ref <18)

## 2021-09-13 MED ORDER — CHLORDIAZEPOXIDE HCL 25 MG PO CAPS: ORAL_CAPSULE | ORAL | 0 refills | Status: DC

## 2021-09-13 MED ORDER — CHLORDIAZEPOXIDE HCL 25 MG PO CAPS
ORAL_CAPSULE | ORAL | 0 refills | Status: DC
Start: 2021-09-13 — End: 2021-09-16
  Filled 2021-09-13: qty 10, 3d supply, fill #0

## 2021-09-13 NOTE — Progress Notes (Signed)
Transition of Care [facility]) - Emergency Department Mini Assessment   Patient Details  Name: [name] MRN: [id number] Date of Birth: [birth year]  [facility] CM/SW Contact:    Illene Regulus, LCSW Phone Number: 09/13/2021, 10:56 AM   Clinical Narrative:  TOC CSW spoke with pt , pt stated he is need assistance with getting into detox facility. CSW explained to pt this Probation officer can not placed patients into substance abuse rehabs. CSW attached substance abuse resource to pt's AVS. CSW explained  this to pt's brother in law as well. Pt asked for transportation to [place] ,[place] provided pt with PART bus pass.    ED Mini Assessment: What brought you to the Emergency Department? 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   Cocaine abuse ([place]) 06/14/2020   Class 2 obesity due to excess calories with body mass index (BMI) of 39.0 to 39.9 in adult 06/14/2020   Alcohol abuse with alcohol-induced mood disorder ([place]) 01/18/2017   Major depressive disorder, recurrent, severe without psychotic features ([place])    Cocaine dependence with cocaine-induced mood disorder ([place])    Alcohol use disorder, severe, dependence ([place]) 04/12/2015   Hypertension 09/08/2011   Post traumatic stress disorder (PTSD) 09/08/2011   PCP:  Merryl Hacker, No Pharmacy:   [facility] [place]  [address] Phone: [phone] Fax: [phone]  [facility] [address] [place] [address] Phone: [phone] Fax: [phone]  [id number] [place], [place] [address] [place] [address] Phone: [phone] Fax: [phone]  [id number] [address], [place] [address] [place] [address] Phone: [phone] Fax: [phone]

## 2021-09-13 NOTE — Discharge Instructions (Addendum)
Please try to check back into a rehab center if you want to quit drinking.

## 2021-09-13 NOTE — ED Provider Notes (Signed)
[name]-EMERGENCY DEPT Provider Note   CSN: [id number] Arrival date & time: 09/13/21  0401     History  Chief Complaint  Patient presents with   Requesting Social Worker    [name] is a 51 y.o. male.  The history is provided by the patient and medical records. No language interpreter was used.   51 year old male who was last seen earlier this morning in the ER for complaints of chest pain and alcohol withdrawals.  He was evaluated for his complaint and fortunately no concerning findings were noted.  Patient subsequently was discharged.  He returns again requesting to speak to a Child psychotherapist.  Patient states he was recently prescribed Librium but unfortunately it was sent to the wrong pharmacy.  He requested to be sent to the local pharmacy of the street.  Furthermore, he also request to talk to social worker to help with transportation to [facility] for further care.  He is without any other complaint.  He does not have any transportation and does not have an available phone  Home Medications Prior to Admission medications   Medication Sig Start Date End Date Taking? Authorizing Provider  albuterol (VENTOLIN HFA) 108 (90 Base) MCG/ACT inhaler Inhale 2 puffs into the lungs every 6 (six) hours as needed for wheezing or shortness of breath.    [provider]  amLODipine (NORVASC) 5 MG tablet Take 1 tablet (5 mg total) by mouth daily. 09/05/20   Benjiman Core, MD  chlordiazePOXIDE (LIBRIUM) 25 MG capsule 50mg  PO TID x 1D, then 25-50mg  PO BID X 1D, then 25-50mg  PO QD X 1D 09/13/21   09/15/21, DO  cloNIDine (CATAPRES) 0.2 MG tablet Take 1 tablet (0.2 mg total) by mouth 3 (three) times daily as needed. 09/05/20   11/03/20, MD  doxepin (SINEQUAN) 10 MG capsule Take 1 capsule (10 mg total) by mouth at bedtime. 09/05/20   11/03/20, MD  escitalopram (LEXAPRO) 10 MG tablet Take 10 mg by mouth daily.    [provider]  hydrOXYzine  (ATARAX/VISTARIL) 50 MG tablet Take 1 tablet (50 mg total) by mouth 3 (three) times daily as needed for anxiety. 09/05/20   11/03/20, MD      Allergies    Metoprolol    Review of Systems   Review of Systems  All other systems reviewed and are negative.  Physical Exam Updated Vital Signs BP (!) 133/96 (BP Location: Right Arm)    Pulse 69    Temp 97.6 F (36.4 C) (Oral)    Resp 16    Ht 5\' 7"  (1.702 m)    Wt 125.2 kg    SpO2 96%    BMI 43.23 kg/m  Physical Exam Vitals and nursing note reviewed.  Constitutional:      General: He is not in acute distress.    Appearance: He is well-developed.  HENT:     Head: Atraumatic.  Eyes:     Conjunctiva/sclera: Conjunctivae normal.  Musculoskeletal:     Cervical back: Neck supple.  Skin:    Findings: No rash.  Neurological:     Mental Status: He is alert.    ED Results / Procedures / Treatments   Labs (all labs ordered are listed, but only abnormal results are displayed) Labs Reviewed - No data to display  EKG None  Radiology DG Chest [facility] 1 View  Result Date: 09/12/2021 CLINICAL DATA:  Right-sided chest pain. EXAM: PORTABLE CHEST 1 VIEW COMPARISON:  PA Lat  08/30/2021. FINDINGS: The heart is moderately enlarged. There is slight central vascular distension without edema. The lungs hyperexpanded but clear. The sulci are sharp. Stable mediastinum with aortic ectasia and moderate tortuosity. Thoracic spondylosis and slight dextroscoliosis. IMPRESSION: No evidence of acute chest disease.  Stable chest with cardiomegaly. Electronically Signed   By: Almira Bar M.D.   On: 09/12/2021 22:01    Procedures Procedures    Medications Ordered in ED Medications - No data to display  ED Course/ Medical Decision Making/ A&P                           Medical Decision Making  BP (!) 133/96 (BP Location: Right Arm)    Pulse 69    Temp 97.6 F (36.4 C) (Oral)    Resp 16    Ht 5\' 7"  (1.702 m)    Wt 125.2 kg    SpO2 96%    BMI 43.23  kg/m   10:06 AM The patient was seen in the ED yesterday for chest pain and alcohol abuse.  He was thoroughly evaluated there and subsequently discharged.  He is returning again but request to have his Librium medication sent to a different pharmacy.  I can certainly help.  He also request to speak with a to see if they can provide transportation help to [facility] as he does not have any means to get his transportation nor does he have an available phone.  I have placed a page to [facility].  Patient otherwise ambulating without difficulty and resting comfortably.  No evidence of alcohol withdrawals.  I have review patient's previous ER visit and consider it in my Plan of Care.  10:53 AM Our social worker have visit pt and provide support.         Final Clinical Impression(s) / ED Diagnoses Final diagnoses:  Encounter for medication refill    Rx / DC Orders ED Discharge Orders          Ordered    chlordiazePOXIDE (LIBRIUM) 25 MG capsule        09/13/21 1004              09/15/21, PA-C 09/13/21 1053    Cur[place]olo, Adam, DO 09/13/21 1118

## 2021-09-13 NOTE — ED Notes (Signed)
Patient states that he cannot [facility] and [facility]. This RN received approval from charge nurse to provide patient with cab voucher.

## 2021-09-13 NOTE — Discharge Instructions (Signed)
I have refilled your Librium and sent to [facility] outpatient pharmacy.  You may obtain your medication there.

## 2021-09-13 NOTE — ED Triage Notes (Signed)
Pt arrives EMS after being discharged from [facility] tonight for alcohol withdrawals. Pt says they did not give him a Child psychotherapist and wanted to know if he came to [facility] could he get one.

## 2021-09-13 NOTE — ED Notes (Signed)
I provided reinforced discharge education based off of discharge instructions. Pt acknowledged and understood my education. Pt had no further questions/concerns for provider/myself. Pt expressed he had to go, and refused discharge vital signs.

## 2021-09-16 ENCOUNTER — Other Ambulatory Visit: Payer: Self-pay

## 2021-09-16 ENCOUNTER — Emergency Department (HOSPITAL_COMMUNITY)
Admission: EM | Admit: 2021-09-16 | Discharge: 2021-09-16 | Disposition: A | Discharge status: Home / Self Care | Payer: Medicare Other | Attending: Emergency Medicine | Admitting: Emergency Medicine

## 2021-09-16 ENCOUNTER — Ambulatory Visit (HOSPITAL_COMMUNITY)
Admission: EM | Admit: 2021-09-16 | Discharge: 2021-09-16 | Disposition: A | Discharge status: Home / Self Care | Payer: Medicare Other

## 2021-09-16 ENCOUNTER — Encounter (HOSPITAL_COMMUNITY): Payer: Self-pay

## 2021-09-16 DIAGNOSIS — F1010 Alcohol abuse, uncomplicated: Secondary | ICD-10-CM

## 2021-09-16 DIAGNOSIS — Y900 Blood alcohol level of less than 20 mg/100 ml: Secondary | ICD-10-CM | POA: Insufficient documentation

## 2021-09-16 DIAGNOSIS — Z79899 Other long term (current) drug therapy: Secondary | ICD-10-CM | POA: Insufficient documentation

## 2021-09-16 DIAGNOSIS — Z765 Malingerer [conscious simulation]: Secondary | ICD-10-CM

## 2021-09-16 LAB — CBC WITH DIFFERENTIAL/PLATELET
Abs Immature Granulocytes: 0.01 10*3/uL (ref 0.00–0.07)
Basophils Absolute: 0 10*3/uL (ref 0.0–0.1)
Basophils Relative: 1 %
Eosinophils Absolute: 0.3 10*3/uL (ref 0.0–0.5)
Eosinophils Relative: 4 %
HCT: 38.1 % — ABNORMAL LOW (ref 39.0–52.0)
Hemoglobin: 12 g/dL — ABNORMAL LOW (ref 13.0–17.0)
Immature Granulocytes: 0 %
Lymphocytes Relative: 30 %
Lymphs Abs: 1.7 10*3/uL (ref 0.7–4.0)
MCH: 27.5 pg (ref 26.0–34.0)
MCHC: 31.5 g/dL (ref 30.0–36.0)
MCV: 87.2 fL (ref 80.0–100.0)
Monocytes Absolute: 0.6 10*3/uL (ref 0.1–1.0)
Monocytes Relative: 11 %
Neutro Abs: 3.1 10*3/uL (ref 1.7–7.7)
Neutrophils Relative %: 54 %
Platelets: 257 10*3/uL (ref 150–400)
RBC: 4.37 MIL/uL (ref 4.22–5.81)
RDW: 15.4 % (ref 11.5–15.5)
WBC: 5.8 10*3/uL (ref 4.0–10.5)
nRBC: 0 % (ref 0.0–0.2)

## 2021-09-16 LAB — COMPREHENSIVE METABOLIC PANEL
ALT: 20 U/L (ref 0–44)
AST: 21 U/L (ref 15–41)
Albumin: 3.2 g/dL — ABNORMAL LOW (ref 3.5–5.0)
Alkaline Phosphatase: 99 U/L (ref 38–126)
Anion gap: 6 (ref 5–15)
BUN: 8 mg/dL (ref 6–20)
CO2: 27 mmol/L (ref 22–32)
Calcium: 7.9 mg/dL — ABNORMAL LOW (ref 8.9–10.3)
Chloride: 102 mmol/L (ref 98–111)
Creatinine, Ser: 0.81 mg/dL (ref 0.61–1.24)
GFR, Estimated: >60 mL/min (ref >60)
Glucose, Bld: 91 mg/dL (ref 70–99)
Potassium: 3.9 mmol/L (ref 3.5–5.1)
Sodium: 135 mmol/L (ref 135–145)
Total Bilirubin: 0.5 mg/dL (ref 0.3–1.2)
Total Protein: 6.2 g/dL — ABNORMAL LOW (ref 6.5–8.1)

## 2021-09-16 LAB — URINALYSIS, ROUTINE W REFLEX MICROSCOPIC
Bilirubin Urine: NEGATIVE
Glucose, UA: NEGATIVE mg/dL
Hgb urine dipstick: NEGATIVE
Ketones, ur: NEGATIVE mg/dL
Leukocytes,Ua: NEGATIVE
Nitrite: NEGATIVE
Protein, ur: NEGATIVE mg/dL
Specific Gravity, Urine: 1.01 (ref 1.005–1.030)
pH: 7.5 (ref 5.0–8.0)

## 2021-09-16 LAB — RAPID URINE DRUG SCREEN, HOSP PERFORMED
Amphetamines: NOT DETECTED
Barbiturates: POSITIVE — AB
Benzodiazepines: POSITIVE — AB
Cocaine: NOT DETECTED
Opiates: NOT DETECTED
Tetrahydrocannabinol: NOT DETECTED

## 2021-09-16 LAB — ETHANOL: Alcohol, Ethyl (B): <10 mg/dL (ref <10)

## 2021-09-16 MED ORDER — CHLORDIAZEPOXIDE HCL 25 MG PO CAPS: ORAL_CAPSULE | ORAL | 0 refills | Status: DC

## 2021-09-16 NOTE — ED Provider Notes (Signed)
Behavioral Health Urgent Care Medical Screening Exam  Patient Name: [name] MRN: [id number] Date of Evaluation: 09/16/21 Chief Complaint:   Diagnosis:  Final diagnoses:  Malingering    History of Present illness: [name] is a 51 y.o. male patient presented to [facility] By his ACTT tream [facility] with complaints of "I want to go back to [facility]".  [name], 51 y.o., male patient seen face to face by this provider, consulted with Dr. Serafina Mitchell; and chart reviewed on 09/16/21.  Per chart review patient has a history of anxiety, bipolar disorder, cocaine abuse, alcohol abuse, and PTSD.  He has services with ACTT team.  He is unable to state his current medications.  On evaluation [name] reports he was at [facility] yesterday afternoon and they refused to see him.  Per chart review patient was also seen at [facility] in [facility] this morning and was discharged. His ACTT team picked him up and brought him to [facility] for assessment.  Patient was admitted to the [facility] continuous assessment unit for overnight observation on 08/31/2021.  He was seen and evaluated by this Probation officer and discharged that day to [facility] recovery services in [place] for alcohol detox and residential treatment.  [facility] services contacted this Probation officer concerning prescription medications for patient.  When this writer returned the call later that afternoon and patient had left AMA.  [facility] states patient had called his brother to come pick him up. He did not want to stay. Reports he has continue drinking 2-4 cans of beer per day and using cocaine when he has the money. He has not used any substance since Friday, three days ago. He denies any symptoms of alcohol withdrawal. He reports a history of seizure when withdrawing from alcohol.  He spends a lot of his time during the assessment discussing how other hospitals have treated him poorly and refused him services.  Discusses how the last  hospital he was at had him removed by 6 police officers and he had to walk 7 miles because they did not provide him transportation.    During evaluation [name] is in sitting position in no acute distress and sleeping.  He is easily awakened.  He is alert/oriented x4.  He makes good eye contact.  His speaking in a clear tone at moderate volume, and normal pace.  He is able to answer questions appropriately.  He is logical.  He endorses depression with feelings of helplessness and hopelessness at times.  Denies any concerns with appetite, reports decreased sleep due to being homeless.  Objectively there is no indication patient is currently responding to internal/external stimuli.  He denies AVH he denies paranoid and delusional thought.  He denies suicidal and homicidal ideations.  He contracts for safety.  He denies access to firearms/weapons.  This Probation officer informed patient that he did not meet criteria for inpatient psychiatric admission. He does not qualify for admission to the [facility] unit (due to [employer]). Discussed resources for [facility] (which is closed today) and [place] treatment center.  Patient requested to be admitted at this facility until "yall can find a place for me". He then asked if he could be transported to [place] because there maybe a place that would take him there.   While nursing staff was walking patient to the lobby he began complaining about his discharge and asked to speak to a supervisor. Per Emogene Morgan RN note "AVS and resources reviewed  with patient.  All questions answered.  Patient verbalized understanding of information presented.  Patient denied SI, HI, AVH.  All belongings returned and belongings sheet signed.  Patient ambulated independently to lobby without issue.  Patient given bus pass.  Patient requesting to speak with Hoyle Sauer NP's supervisor".  [name] NP and [name] TTS were notified and discussed disposition with him in the lobby. He  then called his ACTT team to come and pick him up.    This patient has a history of malingering when he is being discharged. He has a significant history of Emergency room visits requesting alcohol detox. In the past month patient has visited multiple ED's 11 times. Patient appears to be seeking secondary gain for housing.  [place] 09/16/2021 [place] 09/16/2021 [place] 09/15/2021 [facility] 09/13/2021 [facility] 09/12/2021 [place] 08/31/2022  [facility] 08/29/2022 [facility] 08/30/2022 [facility] 08/29/2022 [facility] 08/21/2021 [facility] 08/20/2021  Given all that is stated above and including the fact that the patient shows no signs of mania or psychosis and has a liner, coherent thought process throughout the interview today, the patient does not meet criteria for inpatient psychiatric treatment . Resources will be provided for outpatient residential treatment facilities upon discharge.   Contacted patient's ACTT team contact: [name] [phone].  Reports patient has wraparound services.  He receives medications and an injection monthly.  [name] is unsure of which medications patient takes.  States they know [place] well and he is currently homeless.  His brother is his payee.  At this time [name] is educated and verbalizes understanding of mental health resources and other crisis services in the community.  He is instructed to call 911 and present to the nearest emergency room should he experience any suicidal/homicidal ideation, auditory/visual/hallucinations, or detrimental worsening of his mental health condition.  He was a also advised by Probation officer that he of the insurance card.  could call the toll-free phone on insurance card to assist with identifying in network counselors and agencies or number on back of Medicaid card t speak with care coordinator on the back of the insurance card    Psychiatric Specialty Exam  Presentation  General Appearance:Appropriate for Environment; Casual  Eye  Contact:Good  Speech:Clear and Coherent; Normal Rate  Speech Volume:Normal  Handedness:Right   Mood and Affect  Mood:Depressed  Affect:Congruent   Thought Process  Thought Processes:Coherent  Descriptions of Associations:Intact  Orientation:Full (Time, Place and Person)  Thought Content:Logical  Diagnosis of Schizophrenia or Schizoaffective disorder in past: No  Duration of Psychotic Symptoms: Less than six months  Hallucinations:None  Ideas of Reference:None  Suicidal Thoughts:No  Homicidal Thoughts:No   Sensorium  Memory:Immediate Good; Recent Good; Remote Good  Judgment:Good  Insight:Good   Executive Functions  Concentration:Good  Attention Span:Good  [place]  Language:Good   Psychomotor Activity  Psychomotor Activity:Normal   Assets  Assets:Communication Skills; Desire for Improvement; Physical Health; Resilience; Financial Resources/Insurance; Social Support   Sleep  Sleep:Fair  Number of hours: 6   No data recorded  Physical Exam: Physical Exam Vitals and nursing note reviewed.  Constitutional:      Appearance: Normal appearance. He is well-developed.  HENT:     Head: Normocephalic and atraumatic.  Eyes:     General:        Right eye: No discharge.        Left eye: No discharge.     Conjunctiva/sclera: Conjunctivae normal.  Cardiovascular:     Rate and Rhythm: Normal rate.  Pulmonary:     Effort: Pulmonary effort is normal. No respiratory distress.     Breath sounds: Normal breath sounds.  Musculoskeletal:        General: No swelling. Normal range of motion.     Cervical back: Normal range of motion.  Skin:    Coloration: Skin is not jaundiced or pale.  Neurological:     Mental Status: He is alert and oriented to person, place, and time.  Psychiatric:        Attention and Perception: Attention and perception normal.        Mood and Affect: Mood is depressed.        Speech: Speech normal.         Behavior: Behavior normal. Behavior is cooperative.        Thought Content: Thought content normal.        Cognition and Memory: Cognition normal.        Judgment: Judgment normal.   Review of Systems  Constitutional: Negative.   HENT: Negative.    Eyes: Negative.   Respiratory: Negative.    Cardiovascular: Negative.   Genitourinary: Negative.   Musculoskeletal: Negative.   Skin: Negative.   Endo/Heme/Allergies: Negative.   Psychiatric/Behavioral:  Positive for depression and substance abuse.   Blood pressure 127/81, pulse 73, temperature (!) 97.5 F (36.4 C), resp. rate 14, SpO2 100 %. There is no height or weight on file to calculate BMI.  Musculoskeletal: Strength & Muscle Tone: within normal limits Gait & Station: normal Patient leans: N/A   [place] [facility] Discharge Disposition for Follow up and Recommendations: Based on my evaluation the patient does not appear to have an emergency medical condition and can be discharged with resources and follow up care in outpatient services for Substance Abuse Intensive Outpatient Program  Discharge patient.  Patient does not meet criteria for inpatient psychiatric admission.  Patient is psychiatrically cleared.  Patient is interested in residential substance abuse treatment.  Social worker was contacted.  Resources were given for [facility] recovery services in [place] which are closed today.  He was also given resources and phone number for [facility] treatment center.  He was given resources for sober living of [place]..  No evidence of imminent risk to self or others at present.    Patient does not meet criteria for psychiatric inpatient admission. Discussed crisis plan, support from social network, calling 911, coming to the Emergency Department, and calling Suicide Hotline.    Revonda Humphrey, NP 09/16/2021, 2:35 PM

## 2021-09-16 NOTE — Discharge Instructions (Signed)
Your work-up today was overall quite normal.  I have printed out a prescription for Librium that you requested.  Please do not lose this.  You  state that you have a room available tomorrow at a facility in [place].  He requesting discharge, and at this point there is no medical reason to keep you.  I hope you continue with treatment and feel better soon.  Please seek out emergency care if you develop tremors, seizures, or severe vomiting.

## 2021-09-16 NOTE — Progress Notes (Signed)
AVS and resources reviewed with patient.  All questions answered.  Patient verbalized understanding of information presented.  Patient denied SI, HI, AVH.  All belongings returned and belongings sheet signed.  Patient ambulated independently to lobby without issue.  Patient given bus pass.  Patient requesting to speak with Eber Jones NP's supervisor.  Alcario Drought, NP and Theodoro Grist, TTS out to lobby to visit with patient.Marland Kitchen

## 2021-09-16 NOTE — ED Provider Triage Note (Signed)
Emergency Medicine Provider Triage Evaluation Note  [name] , a 51 y.o. male  was evaluated in triage.  Pt complains of alcohol problem onset 4 days.  He has a facility in [place] that will accept him tomorrow.  His last drink was 4 days ago and it was 2 bootleggers and [employer]. Has not tried medications for his symptoms.  Denies chest pain, shortness of breath, nausea, vomiting, abdominal pain, fever, chills.  Review of Systems  Positive: As per HPI above. Negative: Chest pain, shortness of breath  Physical Exam  BP 109/74 (BP Location: Right Arm)    Pulse 70    Temp (!) 97.3 F (36.3 C) (Oral)    Resp 16    SpO2 99%  Gen:   Awake, no distress   Resp:  Normal effort  MSK:   Moves extremities without difficulty  Other:  No abdominal tenderness to palpation.  Medical Decision Making  Medically screening exam initiated at 4:52 PM.  Appropriate orders placed.  CARMEL WADDINGTON was informed that the remainder of the evaluation will be completed by another provider, this initial triage assessment does not replace that evaluation, and the importance of remaining in the ED until their evaluation is complete.   Binyamin Nelis A, PA-C 09/16/21 1704

## 2021-09-16 NOTE — ED Triage Notes (Signed)
Pt arrived c/o alcohol abuse and pt wants to be put in a treatment center. Pt's last drink was Friday morning. Pt states has been to several places for help and has left.

## 2021-09-16 NOTE — Progress Notes (Signed)
Ludger Nutting, NP and Waunita Schooner, TTS out to speak with patient.  Per providersp, patient discharged in stable condition. No acute distress noted.

## 2021-09-16 NOTE — Discharge Instructions (Addendum)
Please contact one of the following facilities to start medication management and therapy services:  ° °[facility] °[address]  °[place], [place] [address] °[phone]  ° °[facility]  °[address] °[place], [place] [address] °[phone] ° °[facility]  °[address], [place], [place] [address] °[phone] ° °[facility]  °[address]  °[place], [place] [address] °[phone] ° °[facility]  °[address] °[place], [place] [address] °[phone] ° °[facility]  °[address],  °[place], [place] [address] °[phone] ° °Please below the options for shelter in the surrounding area:  ° °[facility] °[phone] Option 4 ° °[facility] ([place])  °[phone] Option 3  ° °[facility] ([place])  °[phone] ° °[facility]  °[phone] ° °[facility] ([place])  °[phone] ° °[facility] ([place])  °[phone] ° °[facility]  °[phone] ° °You may also go to the [facility] at [address], who assists with the following services:  °This is a safe place to rest between the hours of 8AM and 3PM, take care of basic needs and access the services and community that make all the difference. Our guests come to the IRC to take a class, do laundry, meet with a case manager or to get their mail. Sometimes they just need to sit in our dayroom and enjoy a conversation.  Here you will find everything from shower facilities to a computer lab, a mail room, classrooms and meeting spaces.  The [facility] helps people reconnect with their own lives and with the community at large. °

## 2021-09-16 NOTE — Progress Notes (Signed)
TRIAGE: URGENT   09/16/21 0615  [facility] Triage Screening (Walk-ins at [facility] only)  What Is the Reason for Your Visit/Call Today? Pt states he was at [facility] yesterday afternoon but that they refused to see him, stating that he was solely seeking housing. Pt was eventually brought to the [facility] by his ACT Team, [facility]. He shares he's been experiencing EtOH abuse and depressive symptoms, including hopelessness. He states he was here at the [facility] previously and that he was transferred from here to [facility] but that he did not have any of his medication, resulting in being d/c from the program. Pt states he is from [place] and that his payee, his brother, is also in [place]. Pt's payee is Marshal Eskew and he can be reached at [phone]. Pt denies current SI though he acknowledges he's experienced SI in the past; most recently 2 months ago. Pt denies he currently has a plan to kill himself, though he states he attempted to kill himself between the ages of 38 - 26. Pt denies HI, AVH, current NSSIB (he states his last incidents of cutting were at age 89), access to guns/weapons, or engagement with the legal system. Pt acknowleges he had been sober for 9 months prior to October 2022; he states that, since December 2022 he's been drinking on a daily basis. Pt shares he typically drinks 5 4-Locos and 1/5 of gin. He states he has experienced blackouts from w/d, the most recent of which was 2 weeks ago. He states he has experienced seizures from w/d, the most recent of which was 2 weeks ago. He states his last use was Friday, September 13, 2021 in the morning. Pt shares he has been using cocaine regularly since New Year's Eve; he states he smokes "a small amount." He states his last use was Friday, September 13, 2021 in the morning.  How Long Has This Been Causing You Problems? 1-6 months  Have You Recently Had Any Thoughts About Hurting Yourself? No  Are You Planning to Commit Suicide/Harm  Yourself At This time? No  Have you Recently Had Thoughts About Hurting Someone Karolee Ohs? No  Are You Planning To Harm Someone At This Time? No  Are you currently experiencing any auditory, visual or other hallucinations? No  Have You Used Any Alcohol or Drugs in the Past 24 Hours? No  What Did You Use and How Much? N/A  Do you have any current medical co-morbidities that require immediate attention? No  Clinician description of patient physical appearance/behavior: Pt is dressed in weather-appropriate attire. His clothing is clean, though his hands are "ashy." Pt was able to answer the questions posed.  What Do You Feel Would Help You the Most Today? Alcohol or Drug Use Treatment;Medication(s);Treatment for Depression or other mood problem  If access to [facility] Urgent Care was not available, would you have sought care in the Emergency Department? Yes  Determination of Need Urgent (48 hours)  Options For Referral Facility-Based Crisis;Medication Management;Outpatient Therapy;BH Urgent Care

## 2021-09-16 NOTE — ED Notes (Signed)
Vitals were taken by RN and RN stated that the tem machine was not reading.

## 2021-09-16 NOTE — ED Notes (Signed)
Pt resting in stretcher at this time - pt reports no pain - pt calling brother at this time

## 2021-09-16 NOTE — ED Notes (Signed)
Before this RN could go over DC paperwork with patient , pt left with belongings

## 2021-09-16 NOTE — ED Notes (Signed)
RN was able to check temperature after locating another machine.

## 2021-09-17 NOTE — ED Provider Notes (Signed)
[facility] EMERGENCY DEPARTMENT Provider Note   CSN: [id number] Arrival date & time: 09/16/21  1440     History  Chief Complaint  Patient presents with   Alcohol Problem    [name] is a 51 y.o. male who presents ED for evaluation of his alcohol problem.  Patient states that he wants to be put in a treatment center.  He is requesting a Education officer, museum at triage.  At time of evaluation, patient states that his brother called to say that there is a room available for him at a center in [place] tomorrow.  He requests Librium to get him through until that appointment.  He requests to be discharged.  He denies tremors, seizures, headaches, abdominal pain, chest pain, nausea/vomiting/diarrhea.   Alcohol Problem Pertinent negatives include no abdominal pain, no headaches and no shortness of breath.     Home Medications Prior to Admission medications   Medication Sig Start Date End Date Taking? Authorizing Provider  chlordiazePOXIDE (LIBRIUM) 25 MG capsule 50mg  PO TID x 1D, then 25-50mg  PO BID X 1D, then 25-50mg  PO QD X 1D 09/16/21  Yes Harout Scheurich R, PA-C  albuterol (VENTOLIN HFA) 108 (90 Base) MCG/ACT inhaler Inhale 2 puffs into the lungs every 6 (six) hours as needed for wheezing or shortness of breath.    [provider]  amLODipine (NORVASC) 5 MG tablet Take 1 tablet (5 mg total) by mouth daily. 09/05/20   Davonna Belling, MD  cloNIDine (CATAPRES) 0.2 MG tablet Take 1 tablet (0.2 mg total) by mouth 3 (three) times daily as needed. 09/05/20   Davonna Belling, MD  doxepin (SINEQUAN) 10 MG capsule Take 1 capsule (10 mg total) by mouth at bedtime. 09/05/20   Davonna Belling, MD  escitalopram (LEXAPRO) 10 MG tablet Take 10 mg by mouth daily.    [provider]  hydrOXYzine (ATARAX/VISTARIL) 50 MG tablet Take 1 tablet (50 mg total) by mouth 3 (three) times daily as needed for anxiety. 09/05/20   Davonna Belling, MD      Allergies    Metoprolol     Review of Systems   Review of Systems  Constitutional:  Negative for fever.  HENT: Negative.    Eyes: Negative.   Respiratory:  Negative for shortness of breath.   Cardiovascular: Negative.   Gastrointestinal:  Negative for abdominal pain and vomiting.  Endocrine: Negative.   Genitourinary: Negative.   Musculoskeletal: Negative.   Skin:  Negative for rash.  Neurological:  Negative for headaches.  All other systems reviewed and are negative.  Physical Exam Updated Vital Signs BP 118/82    Pulse 82    Temp 97.9 F (36.6 C)    Resp 17    SpO2 98%  Physical Exam Vitals and nursing note reviewed.  Constitutional:      General: He is not in acute distress.    Appearance: He is not ill-appearing.  HENT:     Head: Atraumatic.  Eyes:     Conjunctiva/sclera: Conjunctivae normal.  Cardiovascular:     Rate and Rhythm: Normal rate and regular rhythm.     Pulses: Normal pulses.     Heart sounds: No murmur heard. Pulmonary:     Effort: Pulmonary effort is normal. No respiratory distress.     Breath sounds: Normal breath sounds.  Abdominal:     General: Abdomen is flat. There is no distension.     Palpations: Abdomen is soft.     Tenderness: There is no abdominal  tenderness.  Musculoskeletal:        General: Normal range of motion.     Cervical back: Normal range of motion.  Skin:    General: Skin is warm and dry.     Capillary Refill: Capillary refill takes less than 2 seconds.     Comments: Numerous well-healed linear scars from his hands up to his shoulders bilaterally.  Consistent with self-harm  Neurological:     General: No focal deficit present.     Mental Status: He is alert.     Comments: Speech is clear, able to follow commands CN III-XII intact Normal strength in upper and lower extremities bilaterally including dorsiflexion and plantar flexion, strong and equal grip strength Sensation normal to light and sharp touch Moves extremities without ataxia, coordination  intact Normal finger to nose and rapid alternating movements No pronator drift    Psychiatric:        Mood and Affect: Mood normal.    ED Results / Procedures / Treatments   Labs (all labs ordered are listed, but only abnormal results are displayed) Labs Reviewed  COMPREHENSIVE METABOLIC PANEL - Abnormal; Notable for the following components:      Result Value   Calcium 7.9 (*)    Total Protein 6.2 (*)    Albumin 3.2 (*)    All other components within normal limits  CBC WITH DIFFERENTIAL/PLATELET - Abnormal; Notable for the following components:   Hemoglobin 12.0 (*)    HCT 38.1 (*)    All other components within normal limits  RAPID URINE DRUG SCREEN, HOSP PERFORMED - Abnormal; Notable for the following components:   Benzodiazepines POSITIVE (*)    Barbiturates POSITIVE (*)    All other components within normal limits  ETHANOL  URINALYSIS, ROUTINE W REFLEX MICROSCOPIC    EKG None  Radiology No results found.  Procedures Procedures    Medications Ordered in ED Medications - No data to display  ED Course/ Medical Decision Making/ A&P Clinical Course as of 09/17/21 1323  Mon Sep 16, 2021  2110 Urine rapid drug screen (hosp performed)(!) UDS positive for benzodiazepines and barbiturates [EC]  2110 CBC with Differential(!) CBC unremarkable [EC]  2110 Urinalysis, Routine w reflex microscopic UA without evidence of infection [EC]  2110 Ethanol Negative for alcohol [EC]  2110 Comprehensive metabolic panel(!) CMP unremarkable [EC]    Clinical Course User Index [EC] Tonye Pearson, PA-C                           Medical Decision Making Amount and/or Complexity of Data Reviewed Labs:  Decision-making details documented in ED Course.  Risk Prescription drug management.   History:  [name] is a 51 y.o. male who presents ED for evaluation of his alcohol problem.  Patient states that he wants to be put in a treatment center.  He is requesting a Research officer, political party at triage.  At time of evaluation, patient states that his brother called to say that there is a room available for him at a center in [place] tomorrow.  He requests Librium to get him through until that appointment.  He requests to be discharged.  He denies tremors, seizures, headaches, abdominal pain, chest pain, nausea/vomiting/diarrhea. External records from outside source obtained and reviewed including recent discharge papers from this ED for similar symptoms, as well as records from other health systems Details: Patient discharged from [facility] urgent care for malingering earlier today.  This patient presents to the ED for concern of alcohol abuse, this involves an extensive number of treatment options, and is a complaint that carries with it a high risk of complications and morbidity.   Seizures, tremors, Warnicke's encephalopathy, nutritional deficiencies, depression, suicidal homicidal  Initial impression:  Patient is sitting comfortably in the exam.  No signs of withdrawal.  Patient states that he does not have any withdrawal symptoms and he would like to be discharged.  He states his brother is on his way to pick him up and there is a room available for him at a treatment center in [place] tomorrow.  He requests a refill of his Librium.  His labs at time of evaluation are as described in the ED course.  Lab Tests and EKG:  I Ordered, reviewed, and interpreted labs and EKG.  The pertinent results in my decision-making regarding them are detailed in the ED course and/or initial impression section above.   Imaging Studies ordered:  No imaging indicated   Disposition:  After consideration of the diagnostic results, physical exam, history and the patients response to treatment feel that the patent would benefit from discharge.   Alcohol abuse: Patient was initially requesting Education officer, museum, however at time of evaluation was requesting discharge.  He states that he feels fine.  No  evidence of withdrawal symptoms.  Discharge is reasonable option.  Refilled his Librium and printed off prescription so that he can fill it at a pharmacy of his choice.  All questions asked and answered.  Discharged home in good condition.  Final Clinical Impression(s) / ED Diagnoses Final diagnoses:  Alcohol abuse    Rx / DC Orders ED Discharge Orders          Ordered    chlordiazePOXIDE (LIBRIUM) 25 MG capsule        09/16/21 2137              Tonye Pearson, [place] 09/17/21 2354    Tegeler, Gwenyth Allegra, MD 09/18/21 1010

## 2021-09-20 ENCOUNTER — Encounter: Payer: Self-pay | Admitting: Emergency Medicine

## 2021-09-20 ENCOUNTER — Other Ambulatory Visit: Payer: Self-pay

## 2021-09-20 DIAGNOSIS — E876 Hypokalemia: Secondary | ICD-10-CM | POA: Diagnosis not present

## 2021-09-20 DIAGNOSIS — R079 Chest pain, unspecified: Secondary | ICD-10-CM | POA: Diagnosis not present

## 2021-09-20 DIAGNOSIS — D649 Anemia, unspecified: Secondary | ICD-10-CM | POA: Insufficient documentation

## 2021-09-20 DIAGNOSIS — Y901 Blood alcohol level of 20-39 mg/100 ml: Secondary | ICD-10-CM | POA: Insufficient documentation

## 2021-09-20 DIAGNOSIS — F1010 Alcohol abuse, uncomplicated: Secondary | ICD-10-CM | POA: Insufficient documentation

## 2021-09-20 LAB — CBC
HCT: 38.9 % — ABNORMAL LOW (ref 39.0–52.0)
Hemoglobin: 12.1 g/dL — ABNORMAL LOW (ref 13.0–17.0)
MCH: 26.8 pg (ref 26.0–34.0)
MCHC: 31.1 g/dL (ref 30.0–36.0)
MCV: 86.3 fL (ref 80.0–100.0)
Platelets: 251 10*3/uL (ref 150–400)
RBC: 4.51 MIL/uL (ref 4.22–5.81)
RDW: 15 % (ref 11.5–15.5)
WBC: 5.3 10*3/uL (ref 4.0–10.5)
nRBC: 0 % (ref 0.0–0.2)

## 2021-09-20 LAB — URINE DRUG SCREEN, QUALITATIVE (ARMC ONLY)
Amphetamines, Ur Screen: NOT DETECTED
Barbiturates, Ur Screen: POSITIVE — AB
Benzodiazepine, Ur Scrn: POSITIVE — AB
Cannabinoid 50 Ng, Ur ~~LOC~~: NOT DETECTED
Cocaine Metabolite,Ur ~~LOC~~: NOT DETECTED
MDMA (Ecstasy)Ur Screen: NOT DETECTED
Methadone Scn, Ur: NOT DETECTED
Opiate, Ur Screen: NOT DETECTED
Phencyclidine (PCP) Ur S: NOT DETECTED
Tricyclic, Ur Screen: POSITIVE — AB

## 2021-09-20 LAB — COMPREHENSIVE METABOLIC PANEL
ALT: 15 U/L (ref 0–44)
AST: 16 U/L (ref 15–41)
Albumin: 3.7 g/dL (ref 3.5–5.0)
Alkaline Phosphatase: 100 U/L (ref 38–126)
Anion gap: 7 (ref 5–15)
BUN: 8 mg/dL (ref 6–20)
CO2: 27 mmol/L (ref 22–32)
Calcium: 7.7 mg/dL — ABNORMAL LOW (ref 8.9–10.3)
Chloride: 105 mmol/L (ref 98–111)
Creatinine, Ser: 0.79 mg/dL (ref 0.61–1.24)
GFR, Estimated: >60 mL/min (ref >60)
Glucose, Bld: 104 mg/dL — ABNORMAL HIGH (ref 70–99)
Potassium: 3.4 mmol/L — ABNORMAL LOW (ref 3.5–5.1)
Sodium: 139 mmol/L (ref 135–145)
Total Bilirubin: 0.2 mg/dL — ABNORMAL LOW (ref 0.3–1.2)
Total Protein: 6.7 g/dL (ref 6.5–8.1)

## 2021-09-20 LAB — ETHANOL: Alcohol, Ethyl (B): 41 mg/dL — ABNORMAL HIGH (ref <10)

## 2021-09-20 NOTE — ED Provider Triage Note (Signed)
Emergency Medicine Provider Triage Evaluation Note  GRIFFEY NICASIO, a 51 y.o. male  was evaluated in triage.  Pt complains of excessive alcohol intake. Patient presents via EMS from a friends house. He admits to daily alcohol intake including 5-6 Locos. He attempted to stop drinking yesterday, but consumed Etoh after feeling shaky.   Review of Systems  Positive: EtoH intake, CP Negative: SOB  Physical Exam  There were no vitals taken for this visit. Gen:   Awake, no distress   Resp:  Normal effort  MSK:   Moves extremities without difficulty  Other:  CVS: RRR  Medical Decision Making  Medically screening exam initiated at 7:25 PM.  Appropriate orders placed.  NICKOLAOS BRALLIER was informed that the remainder of the evaluation will be completed by another provider, this initial triage assessment does not replace that evaluation, and the importance of remaining in the ED until their evaluation is complete.  Patient with ED evaluation following excessive EtoH intake.    Lissa Hoard, PA-C 09/20/21 1929

## 2021-09-20 NOTE — ED Notes (Signed)
Patient up.  Yelling in waiting room. "What is taking so long"  hit sign over in the waiting room.  Patient mad because [facility] brought patient here... patient stating "I live in [place], why am I here?  I do not have a ride home."

## 2021-09-20 NOTE — ED Triage Notes (Signed)
First Nurse Note:  Arrives via [facility] for c/o chest pain and alcohol withdrawal.  Per rerport, patient appears intoxicated.  Sleeping, easy to wake.  Vs wnl.  Last drink reported 30 minutes PTA  20 lac

## 2021-09-20 NOTE — ED Triage Notes (Signed)
Pt in via [facility]; reports feeling as if he was going into alcohol withdrawal today after not drinking x 1 day.  To get rid of those symptoms, patient reports drinking a 1/2 bottle of liqour.  Since with headache and right side lower chest pain.  Patient drowsy but arousable; does engage with verbal stimuli.

## 2021-09-20 NOTE — ED Notes (Addendum)
Pt to stat desk asking to be transferred.  Explained to patient that we cannot transfer to another ER from this ER.  Pt asking where the closest gas station is because he is going to "go treat himself with some more alcohol".  Advised pt to wait for a bed and tried to convince him to stay for a room.  He walked out of ED lobby.  1st nurse notified

## 2021-09-21 ENCOUNTER — Encounter (HOSPITAL_COMMUNITY): Payer: Self-pay

## 2021-09-21 ENCOUNTER — Emergency Department
Admission: EM | Admit: 2021-09-21 | Discharge: 2021-09-21 | Disposition: A | Discharge status: Other Acute Inpatient Hospital | Payer: Medicare Other | Attending: Physician Assistant | Admitting: Physician Assistant

## 2021-09-21 ENCOUNTER — Encounter (HOSPITAL_COMMUNITY): Payer: Self-pay | Admitting: Emergency Medicine

## 2021-09-21 ENCOUNTER — Emergency Department (HOSPITAL_COMMUNITY)
Admission: EM | Admit: 2021-09-21 | Discharge: 2021-09-22 | Disposition: A | Discharge status: Home / Self Care | Payer: Medicare Other | Source: Home / Self Care | Attending: Emergency Medicine | Admitting: Emergency Medicine

## 2021-09-21 ENCOUNTER — Other Ambulatory Visit: Payer: Self-pay

## 2021-09-21 ENCOUNTER — Emergency Department (HOSPITAL_COMMUNITY)
Admission: EM | Admit: 2021-09-21 | Discharge: 2021-09-21 | Disposition: A | Discharge status: Home / Self Care | Payer: Medicare Other | Source: Home / Self Care | Attending: Emergency Medicine | Admitting: Emergency Medicine

## 2021-09-21 ENCOUNTER — Emergency Department (HOSPITAL_COMMUNITY)
Admission: EM | Admit: 2021-09-21 | Discharge: 2021-09-21 | Disposition: A | Discharge status: Home / Self Care | Payer: Medicare Other | Attending: Emergency Medicine | Admitting: Emergency Medicine

## 2021-09-21 DIAGNOSIS — Y909 Presence of alcohol in blood, level not specified: Secondary | ICD-10-CM | POA: Insufficient documentation

## 2021-09-21 DIAGNOSIS — F1090 Alcohol use, unspecified, uncomplicated: Secondary | ICD-10-CM | POA: Diagnosis present

## 2021-09-21 DIAGNOSIS — Z760 Encounter for issue of repeat prescription: Secondary | ICD-10-CM | POA: Diagnosis not present

## 2021-09-21 DIAGNOSIS — F1010 Alcohol abuse, uncomplicated: Secondary | ICD-10-CM | POA: Insufficient documentation

## 2021-09-21 DIAGNOSIS — F10239 Alcohol dependence with withdrawal, unspecified: Secondary | ICD-10-CM | POA: Insufficient documentation

## 2021-09-21 DIAGNOSIS — Z811 Family history of alcohol abuse and dependence: Secondary | ICD-10-CM

## 2021-09-21 DIAGNOSIS — Z79899 Other long term (current) drug therapy: Secondary | ICD-10-CM | POA: Diagnosis not present

## 2021-09-21 DIAGNOSIS — Z5321 Procedure and treatment not carried out due to patient leaving prior to being seen by health care provider: Secondary | ICD-10-CM | POA: Insufficient documentation

## 2021-09-21 DIAGNOSIS — Z789 Other specified health status: Secondary | ICD-10-CM

## 2021-09-21 LAB — RAPID URINE DRUG SCREEN, HOSP PERFORMED
Amphetamines: NOT DETECTED
Barbiturates: POSITIVE — AB
Benzodiazepines: POSITIVE — AB
Cocaine: NOT DETECTED
Opiates: NOT DETECTED
Tetrahydrocannabinol: NOT DETECTED

## 2021-09-21 LAB — COMPREHENSIVE METABOLIC PANEL
ALT: 15 U/L (ref 0–44)
AST: 17 U/L (ref 15–41)
Albumin: 3.5 g/dL (ref 3.5–5.0)
Alkaline Phosphatase: 102 U/L (ref 38–126)
Anion gap: 7 (ref 5–15)
BUN: 9 mg/dL (ref 6–20)
CO2: 29 mmol/L (ref 22–32)
Calcium: 8.1 mg/dL — ABNORMAL LOW (ref 8.9–10.3)
Chloride: 104 mmol/L (ref 98–111)
Creatinine, Ser: 1.07 mg/dL (ref 0.61–1.24)
GFR, Estimated: >60 mL/min (ref >60)
Glucose, Bld: 89 mg/dL (ref 70–99)
Potassium: 3.8 mmol/L (ref 3.5–5.1)
Sodium: 140 mmol/L (ref 135–145)
Total Bilirubin: 0.4 mg/dL (ref 0.3–1.2)
Total Protein: 6.4 g/dL — ABNORMAL LOW (ref 6.5–8.1)

## 2021-09-21 LAB — CBC
HCT: 38.2 % — ABNORMAL LOW (ref 39.0–52.0)
Hemoglobin: 11.9 g/dL — ABNORMAL LOW (ref 13.0–17.0)
MCH: 27 pg (ref 26.0–34.0)
MCHC: 31.2 g/dL (ref 30.0–36.0)
MCV: 86.8 fL (ref 80.0–100.0)
Platelets: 240 10*3/uL (ref 150–400)
RBC: 4.4 MIL/uL (ref 4.22–5.81)
RDW: 15.3 % (ref 11.5–15.5)
WBC: 5.2 10*3/uL (ref 4.0–10.5)
nRBC: 0 % (ref 0.0–0.2)

## 2021-09-21 LAB — ETHANOL: Alcohol, Ethyl (B): <10 mg/dL (ref <10)

## 2021-09-21 NOTE — ED Notes (Signed)
Pt repeatedly coming to nurses station to request seeing a provider. This RN explained that a provider will come over and speak with pt as soon as they are available. RN gave pt warm blanket and sandwich bag. EDP made aware of pt concerns. RN requested that pt remain at his bed.

## 2021-09-21 NOTE — ED Triage Notes (Signed)
Pt presents requesting prescription for Librium. Pt is currently intoxicated and states that he does not want to stop drinking. Pt was seen at [facility] for same.

## 2021-09-21 NOTE — ED Notes (Signed)
Pt to the charge desk numerous times to express concerns about not receiving "appropriate care for severe detox." Message sent to primary RN and MD. Pt does not appear to be detoxing, steady gait noted. Pt then came back to charge desk requesting transfer to another facility for behavioral health or discharged. Explained again to pt that the doctor needs to see him to determine those needs. Pt requested to use the phone. After using the phone pt left through ambulance bay

## 2021-09-21 NOTE — ED Provider Notes (Signed)
[facility]-EMERGENCY DEPT Provider Note   CSN: [id number] Arrival date & time: 09/21/21  1218     History  Chief Complaint  Patient presents with   Alcohol Intoxication    [name] is a 51 y.o. male.  HPI   51 y/o male with a h/o etoh use presents to the ED requesting rx for librium stating that he is withdrawing from etoh. He has been seen in the ed several times over the last few days with similar complaints. He last had an rx for librium 5 days ago. He states he drank while taking this. His last drink was last night. He was seen at [facility] p[facility] for a similar complaint about 3 hours prior to my evaluation. He had a full w/u and was determined to be stable for discharge. Following this he got a ride here from his ACT team member. Denies nv.   Home Medications Prior to Admission medications   Medication Sig Start Date End Date Taking? Authorizing Provider  albuterol (VENTOLIN HFA) 108 (90 Base) MCG/ACT inhaler Inhale 2 puffs into the lungs every 6 (six) hours as needed for wheezing or shortness of breath.    [provider]  amLODipine (NORVASC) 5 MG tablet Take 1 tablet (5 mg total) by mouth daily. 09/05/20   Benjiman Core, MD  chlordiazePOXIDE (LIBRIUM) 25 MG capsule 50mg  PO TID x 1D, then 25-50mg  PO BID X 1D, then 25-50mg  PO QD X 1D 09/16/21   09/18/21 R, PA-C  cloNIDine (CATAPRES) 0.2 MG tablet Take 1 tablet (0.2 mg total) by mouth 3 (three) times daily as needed. 09/05/20   11/03/20, MD  doxepin (SINEQUAN) 10 MG capsule Take 1 capsule (10 mg total) by mouth at bedtime. 09/05/20   11/03/20, MD  escitalopram (LEXAPRO) 10 MG tablet Take 10 mg by mouth daily.    [provider]  hydrOXYzine (ATARAX/VISTARIL) 50 MG tablet Take 1 tablet (50 mg total) by mouth 3 (three) times daily as needed for anxiety. 09/05/20   11/03/20, MD      Allergies    Metoprolol    Review of Systems   Review of  Systems  See HPI for pertinent positives or negatives.   Physical Exam Updated Vital Signs BP 113/80    Pulse 64    Temp 97.9 F (36.6 C) (Oral)    Resp 18    SpO2 96%  Physical Exam Constitutional:      General: He is not in acute distress.    Appearance: He is well-developed.  Eyes:     Conjunctiva/sclera: Conjunctivae normal.  Cardiovascular:     Rate and Rhythm: Normal rate.  Pulmonary:     Effort: Pulmonary effort is normal.  Skin:    General: Skin is warm and dry.  Neurological:     Mental Status: He is alert and oriented to person, place, and time.    ED Results / Procedures / Treatments   Labs (all labs ordered are listed, but only abnormal results are displayed) Labs Reviewed - No data to display  EKG None  Radiology No results found.  Procedures Procedures    Medications Ordered in ED Medications - No data to display  ED Course/ Medical Decision Making/ A&P                           Medical Decision Making  51 y/o male with a h/o etoh use  presents to the ED requesting rx for librium stating that he is withdrawing from etoh. He has been seen in the ed several times over the last few days with similar complaints. He last had an rx for librium 5 days ago. He states he drank while taking this. His last drink was last night. He was seen at [facility] p[facility] for a similar complaint about 3 hours prior to my evaluation. He had a full w/u and was determined to be stable for discharge. Following this he got a ride here from his ACT team member. Denies nv.   Pt has a ciwa score of 2 here in the ED. We discussed an additional rx for librium and at this time I do not feel that it would be safe to give an additional rx given that he was using etoh while taking it last week and is at risk for this again if an additional rx is given. He does not appear to have any evidence of complicated withdrawal at this time and I see no other indication that he requires a  repeat w/u or further evaluation given that he just had a very thorough w/u about 3 hours ago at [facility]. He does not appear to be acutely psychiatrically unstable either. I have discussed the plan to give resources for outpatient care as well as consultation with our peer support time for further assistance regarding his etoh use.   Pt then eloped from the ed without his paperwork   Final Clinical Impression(s) / ED Diagnoses Final diagnoses:  Alcohol abuse    Rx / DC Orders ED Discharge Orders          Ordered    Consult to Peer Support       Provider:  (Not yet assigned)   09/21/21 1349              Karrie Meres, PA-C 09/21/21 1415    Pollyann Savoy, MD 09/21/21 1644

## 2021-09-21 NOTE — ED Notes (Signed)
Dr. Don Perking to assess pt in triage 3.

## 2021-09-21 NOTE — ED Notes (Signed)
Pt left prior to receiving discharge paperwork  

## 2021-09-21 NOTE — ED Notes (Signed)
Social worker Jens Som [phone] would like an update

## 2021-09-21 NOTE — ED Notes (Signed)
Dr. Jasmene Goswami speaking with pt.

## 2021-09-21 NOTE — ED Notes (Signed)
Pt states he is feeling weak, vital signs obtained, snack provided. No diaphoresis noted

## 2021-09-21 NOTE — ED Notes (Addendum)
Notified by another pt in lobby that pt "has passed out again". Pt seen lying on floor infront of vending machines, pt encouraged to sit in wheelchair, but continually refuses chair. Pt immediately got up out of floor and states he "did not do it on purpose". Pt will not admit to injury. Pt encouraged multiple times to get in wheelchair but continues to refuse chair. Pt eventually sat in chair after convincing several times. Pt states he needs to use restroom. Pt wheeled to restroom in triage placed immediately next to toilet and told to pull call string when ready to be taken from restroom.

## 2021-09-21 NOTE — ED Triage Notes (Signed)
[facility] [place] [facility] [place] requesting detox from ETOH. Per EMS [facility] will accept pt once he gets his meds refilled. Pt sleepy, because he took two of his doxapin/hydrochloride pills shortly before EMS arrived. Pt is orientedx4 and arousal to voice.

## 2021-09-21 NOTE — ED Notes (Signed)
Per unit secretary- pt was transferred to [facility] and needs to be discharged out of system. Will discharge out of system per request, pt not seen by this RN.

## 2021-09-21 NOTE — ED Provider Notes (Signed)
I was asked to assess this patient in triage after he passed out in the WR. Patient denies hitting head, HA, neck or back pain. Complaining of small abrasions to the L 4th and 5th digits with no deformities. No indication for imaging. Patient is next in line to be evaluated on main side.   Don Perking, [place], MD 09/21/21 [phone]

## 2021-09-21 NOTE — ED Notes (Signed)
Social Work number for this pt. [name] with [employer] [phone].

## 2021-09-21 NOTE — ED Provider Notes (Signed)
[facility] Provider Note    None    (approximate)   History   Alcohol Problem   HPI  [name] is a 51 y.o. male who presents to the ED for evaluation of Alcohol Problem   I reviewed recurrent ED visits throughout various ED's in the region.  Patient was seen for alcohol abuse, alcohol withdrawals, malingering.  He has had 12 ED visits for this in the past 3 weeks.  He was given a course of Librium 5 days ago.  Patient presented last night due to concerns for alcohol withdrawals.  He reports drinking half a bottle of johnny walker liquor to combat this and presents to the ED.  He has been waiting for about 16 hours prior to my evaluation.  He has been in the waiting room, and in our triage area.  He has been asleep this whole time.  I see him in triage and he awakens easily to vocal stimulation.  He reports that he is currently in alcohol withdrawals, "in DTs and seizing" while we are talking.  He is explicitly requesting additional doses of Librium.  When I discussed with him that he does not seem to be withdrawing and may not benefit from more Librium, he says this is unfair, causing a scene and initially refusing to leave.  Denies any suicidal thoughts, homicidality or AV hallucinations.  Physical Exam   Triage Vital Signs: ED Triage Vitals  Enc Vitals Group     BP 09/21/21 0213 94/61     Pulse Rate 09/21/21 0213 60     Resp 09/21/21 0213 14     Temp 09/21/21 0213 97.7 F (36.5 C)     Temp Source 09/21/21 0213 Oral     SpO2 09/21/21 0213 98 %     Weight 09/20/21 1927 281 lb (127.5 kg)     Height 09/20/21 1927 5\' 7"  (1.702 m)     Head Circumference --      Peak Flow --      Pain Score 09/20/21 1926 7     Pain Loc --      Pain Edu? --      Excl. in GC? --     Most recent vital signs: Vitals:   09/21/21 0401 09/21/21 0526  BP: 103/61 104/66  Pulse: (!) 58 (!) 58  Resp: 17 16  Temp:    SpO2: 95% 94%    General: Awake, no  distress.  Conversational in full sentences without distress.  Ambulatory with a normal gait.  No tremors to the extremities, fasciculations of the tongue CV:  Good peripheral perfusion.  Resp:  Normal effort.  Abd:  No distention.  Nontender MSK:  No deformity noted.  Neuro:  No focal deficits appreciated. Other:     ED Results / Procedures / Treatments   Labs (all labs ordered are listed, but only abnormal results are displayed) Labs Reviewed  COMPREHENSIVE METABOLIC PANEL - Abnormal; Notable for the following components:      Result Value   Potassium 3.4 (*)    Glucose, Bld 104 (*)    Calcium 7.7 (*)    Total Bilirubin 0.2 (*)    All other components within normal limits  ETHANOL - Abnormal; Notable for the following components:   Alcohol, Ethyl (B) 41 (*)    All other components within normal limits  CBC - Abnormal; Notable for the following components:   Hemoglobin 12.1 (*)    HCT 38.9 (*)  All other components within normal limits  URINE DRUG SCREEN, QUALITATIVE ([facility] ONLY) - Abnormal; Notable for the following components:   Tricyclic, Ur Screen POSITIVE (*)    Barbiturates, Ur Screen POSITIVE (*)    Benzodiazepine, Ur Scrn POSITIVE (*)    All other components within normal limits    EKG    RADIOLOGY   Official radiology report(s): No results found.  PROCEDURES and INTERVENTIONS:  Procedures  Medications - No data to display   IMPRESSION / MDM / ASSESSMENT AND PLAN / ED COURSE  I reviewed the triage vital signs and the nursing notes.  51 year old male present to the ED with recurrent alcohol abuse demanding Librium, but suitable for outpatient management and referral to [facility] for detox.  I see no evidence of withdrawals to necessitate benzodiazepines.  Reports using clonidine to help with his withdrawals and shows me the pill bottle for this.  Blood work is fairly benign, minimal anemia is noted, ethanol level is marginally elevated and has marginal  hypokalemia.  No evidence of psychiatric emergency does not necessitate IVC and referred him to [facility] to discuss detox options.  We discussed return precautions for the ED.      FINAL CLINICAL IMPRESSION(S) / ED DIAGNOSES   Final diagnoses:  Alcohol abuse     Rx / DC Orders   ED Discharge Orders     None        Note:  This document was prepared using Dragon voice recognition software and may include unintentional dictation errors.   Delton Pra[place], MD 09/21/21 [phone]

## 2021-09-21 NOTE — Discharge Instructions (Addendum)
Please follow up with the resources provided in your discharge paperwork

## 2021-09-21 NOTE — ED Notes (Signed)
Pt just left [facility] after evaluation and asked ACT team to drive him here. Pt come in requesting Librium stating he is withdrawing  from Alcohol. Writer explained we will be glad to evaluate him but he just received refill on his Librium, He states he his withdrawing and not willing to give up ETOH, last drink last night, ACT member verified he was drinking last night. Pt verbalizes he does not want to stop drinking but still wants Librium. [facility] [employer] speaking with pt. Pt wants to leave and go get more Alcohol. Again Clinical research associate explained we are glad to see him for evaluation but cannot tell him he will be able to get a prescription for the Librium. ACT member is apologetic stating he has explained this to him prior to coming to this facility.

## 2021-09-21 NOTE — ED Provider Notes (Incomplete)
[facility] EMERGENCY DEPARTMENT Provider Note   CSN: [id number] Arrival date & time: 09/21/21  1648     History {Add pertinent medical, surgical, social history, OB history to HPI:1} Chief Complaint  Patient presents with   detox from ETOH    [name] is a 51 y.o. male.  HPI     Home Medications Prior to Admission medications   Medication Sig Start Date End Date Taking? Authorizing Provider  albuterol (VENTOLIN HFA) 108 (90 Base) MCG/ACT inhaler Inhale 2 puffs into the lungs every 6 (six) hours as needed for wheezing or shortness of breath.    [provider]  amLODipine (NORVASC) 5 MG tablet Take 1 tablet (5 mg total) by mouth daily. 09/05/20   Benjiman Core, MD  chlordiazePOXIDE (LIBRIUM) 25 MG capsule 50mg  PO TID x 1D, then 25-50mg  PO BID X 1D, then 25-50mg  PO QD X 1D 09/16/21   09/18/21 R, PA-C  cloNIDine (CATAPRES) 0.2 MG tablet Take 1 tablet (0.2 mg total) by mouth 3 (three) times daily as needed. 09/05/20   11/03/20, MD  doxepin (SINEQUAN) 10 MG capsule Take 1 capsule (10 mg total) by mouth at bedtime. 09/05/20   11/03/20, MD  escitalopram (LEXAPRO) 10 MG tablet Take 10 mg by mouth daily.    [provider]  hydrOXYzine (ATARAX/VISTARIL) 50 MG tablet Take 1 tablet (50 mg total) by mouth 3 (three) times daily as needed for anxiety. 09/05/20   11/03/20, MD      Allergies    Metoprolol    Review of Systems   Review of Systems  Physical Exam Updated Vital Signs BP 108/68    Pulse (!) 59    Temp 98.4 F (36.9 C) (Oral)    Resp 20    Ht 5\' 7"  (1.702 m)    Wt 127.5 kg    SpO2 98%    BMI 44.02 kg/m  Physical Exam  ED Results / Procedures / Treatments   Labs (all labs ordered are listed, but only abnormal results are displayed) Labs Reviewed  COMPREHENSIVE METABOLIC PANEL - Abnormal; Notable for the following components:      Result Value   Calcium 8.1 (*)    Total Protein 6.4 (*)    All other  components within normal limits  CBC - Abnormal; Notable for the following components:   Hemoglobin 11.9 (*)    HCT 38.2 (*)    All other components within normal limits  RAPID URINE DRUG SCREEN, HOSP PERFORMED - Abnormal; Notable for the following components:   Benzodiazepines POSITIVE (*)    Barbiturates POSITIVE (*)    All other components within normal limits  ETHANOL    EKG None  Radiology No results found.  Procedures Procedures  {Document cardiac monitor, telemetry assessment procedure when appropriate:1}  Medications Ordered in ED Medications - No data to display  ED Course/ Medical Decision Making/ A&P                           Medical Decision Making Amount and/or Complexity of Data Reviewed Labs: ordered.   ***  {Document critical care time when appropriate:1} {Document review of labs and clinical decision tools ie heart score, Chads2Vasc2 etc:1}  {Document your independent review of radiology images, and any outside records:1} {Document your discussion with family members, caretakers, and with consultants:1} {Document social determinants of health affecting pt's care:1} {Document your decision making why or why not  admission, treatments were needed:1} Final Clinical Impression(s) / ED Diagnoses Final diagnoses:  None    Rx / DC Orders ED Discharge Orders     None

## 2021-09-21 NOTE — ED Notes (Signed)
Pt calling ACT team for ride

## 2021-09-21 NOTE — ED Triage Notes (Signed)
Pt BIB [facility] after recently leaving ED. Pt requesting detox from ETOH. States last drink was yesterday. No signs of withdrawal noted at this time.

## 2021-09-22 ENCOUNTER — Ambulatory Visit (HOSPITAL_COMMUNITY)
Admission: EM | Admit: 2021-09-22 | Discharge: 2021-09-22 | Disposition: A | Discharge status: Rehabilitation Facility | Payer: Medicare Other | Attending: Behavioral Health | Admitting: Behavioral Health

## 2021-09-22 DIAGNOSIS — F1490 Cocaine use, unspecified, uncomplicated: Secondary | ICD-10-CM | POA: Insufficient documentation

## 2021-09-22 DIAGNOSIS — F10930 Alcohol use, unspecified with withdrawal, uncomplicated: Secondary | ICD-10-CM

## 2021-09-22 DIAGNOSIS — F10230 Alcohol dependence with withdrawal, uncomplicated: Secondary | ICD-10-CM | POA: Insufficient documentation

## 2021-09-22 DIAGNOSIS — Z765 Malingerer [conscious simulation]: Secondary | ICD-10-CM | POA: Diagnosis not present

## 2021-09-22 DIAGNOSIS — R440 Auditory hallucinations: Secondary | ICD-10-CM | POA: Insufficient documentation

## 2021-09-22 DIAGNOSIS — R441 Visual hallucinations: Secondary | ICD-10-CM | POA: Diagnosis not present
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
## 2021-09-22 NOTE — ED Provider Notes (Signed)
Behavioral Health Urgent Care Medical Screening Exam  Patient Name: [name] MRN: [id number] Date of Evaluation: 09/22/21 Diagnosis:  Final diagnoses:  Alcohol withdrawal syndrome without complication (HCC)   History of Present illness: [name] is a 51 y.o. male patient who presents to the [facility] voluntarily accompanied by [employer] with a chief complaint of requesting to go to [facility] in [name] for detox.   Patient seen and evaluated face-to-face by this provider, chart reviewed and case discussed with Dr. Gasper Sells. On evaluation, patient is alert and oriented x4. His thought process is logical and he is goal oriented. His speech is clear and coherent. His mood is dysphoric and affect is congruent.  Patient states that he is trying to get transportation to the [facility] in [place] for alcohol detox. He states that he has been to several hospitals and continue to kick him out.   He reports drinking 5-7 (4) [place] daily. He states that his last drink was on last Friday. He reports drinking everyday since December 2022. He reports that he relapsed in October after a sobriety of 10 months.  He reports using cocaine occasionally, 4 times since late December. He reports that the last time he used cocaine was January 16th.  He currently reports alcohol withdrawal symptoms of tremors, hallucinations, and headaches. No significant withdrawal symptoms noted. He reports a history of alcohol withdrawal seizure 4 weeks ago.  Patient endorses visual hallucinations when he closes his eyes and describes it as seeing "fuzzy things."  Patient endorses auditory hallucinations telling him that he should not have went to his mother's cousin funeral because he was going to use alcohol. There is no objective evidence that he is responding to internal or external stimuli. He denies SI/HI  Patient was asked if he had completed the Librium taper that was provided to him while in the ED on 1/16. Patient states  that he finished the last dose of Librium on Friday and he relapsed after the funeral that same day.  Per chat review: patient was seen at [facility] yesterday on 09/21/21 with similar presentation. Per ED note, this is the patient's ninth ED visit in the last week with similar complaints. Patient was prescribed a lithium 5-day taper on 09/16/21 at [facility]. Patient was seen at the [facility] on 09/16/21 and discharged  substance abuse resources to follow up with.   Per note on 09/16/21: This patient has a history of malingering when he is being discharged. He has a significant history of Emergency room visits requesting alcohol detox. In the past month patient has visited multiple ED's 11 times. Patient appears to be seeking secondary gain for housing.   [facility] 09/16/2021 [facility] 09/16/2021 [facility] 09/15/2021 [facility] 09/13/2021 [facility] 09/12/2021 [facility] 08/31/2022  [facility] 08/29/2022 [facility] 08/30/2022 [facility] 08/29/2022 [facility] 08/21/2021 [facility] 08/20/2021  Psychiatric Specialty Exam  Presentation  General Appearance:Appropriate for Environment  Eye Contact:Fair  Speech:Clear and Coherent  Speech Volume:Normal  Handedness:Right   Mood and Affect  Mood:Dysphoric  Affect:Congruent   Thought Process  Thought Processes:Coherent; Goal Directed  Descriptions of Associations:Intact  Orientation:Full (Time, Place and Person)  Thought Content:Logical  Diagnosis of Schizophrenia or Schizoaffective disorder in past: No  Duration of Psychotic Symptoms: No data recorded Hallucinations:None  Ideas of Reference:None  Suicidal Thoughts:No  Homicidal Thoughts:No   Sensorium  Memory:Immediate Fair; Recent Fair; Remote Fair  Judgment:Fair  Insight:Fair   Executive Functions  Concentration:Fair  Attention Span:Fair  Recall:Fair  Fund of Knowledge:Fair  Language:Fair   Psychomotor  Activity  Psychomotor Activity:Normal   Assets  Assets:Communication Skills; Desire for Improvement;  Physical Health; Leisure Time   Sleep  Sleep:Fair  Number of hours: 6   No data recorded  Physical Exam: Physical Exam Constitutional:      Appearance: Normal appearance.  HENT:     Head: Normocephalic and atraumatic.     Nose: Nose normal.  Eyes:     Conjunctiva/sclera: Conjunctivae normal.  Cardiovascular:     Rate and Rhythm: Normal rate.  Pulmonary:     Effort: Pulmonary effort is normal.  Musculoskeletal:        General: Normal range of motion.     Cervical back: Normal range of motion.  Neurological:     Mental Status: He is alert and oriented to person, place, and time.   Review of Systems  Constitutional: Negative.   HENT: Negative.    Eyes: Negative.   Respiratory: Negative.    Cardiovascular: Negative.   Gastrointestinal: Negative.   Genitourinary: Negative.   Musculoskeletal: Negative.   Neurological:  Positive for tremors and headaches.  Psychiatric/Behavioral:  Positive for substance abuse.   Blood pressure 113/74, pulse 74, temperature 98.2 F (36.8 C), temperature source Oral, SpO2 95 %. There is no height or weight on file to calculate BMI.  Musculoskeletal: Strength & Muscle Tone: within normal limits Gait & Station: normal Patient leans: N/A   [facility] MSE Discharge Disposition for Follow up and Recommendations: Based on my evaluation the patient does not appear to have an emergency medical condition and can be discharged with resources and follow up care in outpatient services for substance abuse treatment  This provider consulted with Chryl Heck CSW., who contacted [facility] in [place] for detox assessment for patient. Patient was transferred to [facility] in [place] via [employer].  Layla Barter, NP 09/22/2021, 12:42 PM

## 2021-09-22 NOTE — Discharge Instructions (Signed)
There is reportedly a spot for you at [facility].  You can go see them for further follow-up.

## 2021-09-22 NOTE — ED Notes (Signed)
Safe Transport called for services to be provided to [facility] in [place].

## 2021-09-22 NOTE — Discharge Instructions (Addendum)
Substance Abuse Resources   [facility] - Admissions are currently completed Monday through Friday at [facility]; both appointments and walk-ins are accepted.  Any individual that is a [facility] resident may present for a substance abuse screening and assessment for admission.  A person may be referred by numerous sources or self-refer.   Potential clients will be screened for medical necessity and appropriateness for the program.  Clients must meet criteria for high-intensity residential treatment services.  If clinically appropriate, a client will continue with the comprehensive clinical assessment and intake process, as well as enrollment in the [place].  Address: [address] [place], [place] [address] Admin Hours: Mon-Fri 8AM to [name] Hours: 24/7 Phone: [phone] Fax: [phone]  [facility] (Detox) Facility Based Crisis:  These are 3 locations for services: Please call before arrival:    [facility])  Address: [address] [place], [place] [address] Phone: [phone]  [facility]) Address: [address] [place], [place] [address] Phone#: [phone]  [facility]) Address: [address] [place], [place] [address] Phone#: [phone]   [facility] ([facility]): (offers outpatient therapy and intensive outpatient substance abuse therapy).  [address], [place], [place] [address] Phone: [phone]  [facility] Address: [address] [place], [place] [address] Phone: [phone]  -The [facility] provides food, shelter and other programs and services to the homeless men of [place] through our [employer] program.  By offering safe shelter, three meals a day, clean clothing, Biblical counseling, financial planning, vocational training, GED/education and  employment assistance, we've helped mend the shattered lives of many homeless men since opening in [place].  We have approximately 267 beds available, with a max of 312 beds including mats for emergency situations and currently house an average of 270 men a night.  Prospective Client Check-In Information Photo ID Required (State/ Out of State/ [facility]) - if photo ID is not available, clients are required to have a printout of a police/sheriff's criminal history report. Help out with chores around the [place]. No sex offender of any type (pending, charged, registered and/or any other sex related offenses) will be permitted to check in. Must be willing to abide by all rules, regulations, and policies established by the [employer]. The following will be provided - shelter, food, clothing, and biblical counseling. If you or someone you know is in need of assistance at our [facility] shelter in [place], [place], please call [phone] ext. [phone].  [facility] for [employer] hours are Monday-Friday only.   [facility]:   Phone: [phone]  The [facility] SA Intensive Outpatient Program [facility]) means structured individual and group addiction activities and services that are provided at an outpatient program designed to assist adult and adolescent consumers to begin recovery and learn skills for recovery maintenance. The [place] program is offered at least 3 hours a day, 3 days a week. SAIOP services shall include a structured program consisting of, but not limited to, the following services: Individual counseling and support; Group counseling and support; Family counseling, training or support; Biochemical assays to identify recent drug use (e.g., urine drug screens); Strategies for relapse prevention to include community and social support systems in treatment; Life skills; Crisis contingency planning; Disease Management; and Treatment  support activities that have been adapted or  specifically designed for persons with physical disabilities, or persons with co-occurring disorders of mental illness and substance abuse/dependence or mental retardation/developmental disability and substance abuse/dependence.  Phone: [facility])  Address: [place], [place], [place] [address] Phone: [phone]   [place] Address: [address] [place], [place] [address] Phone: [phone]  - a combination of group and individual sessions to meet the participants needs. This allows participants to engage in treatment and remain involved in their home and work life. - Transitional housing places program participants in a supportive living environment while they complete a treatment program and work to secure independent housing. - The Substance Abuse Intensive Outpatient Treatment Program at [employer] consists of structured group sessions and individual sessions that are designed to teach participants early recovery and relapse prevention skills. -[facility] works with the [employer] to provide a housing and treatment program for homeless veterans.   [facility] [employer].   Address: [address] [place], [place] [address] Phone#: [phone]   : Referrals to RTSA facilities can be made by [place] and [facility].  Referrals are also accepted from physicians, private providers, hospital emergency rooms, family members, or any person who has knowledge of someone in the need of our services.  [facility] will also offer the following outpatient services: (Monday through Friday 8am-5pm)   Partial Hospitalization Program (PHP) Substance Abuse Intensive Outpatient Program (SA-IOP) Group Therapy Medication Management Peer Living Room We also provide (24/7):  Assessments: Our mental health  clinician and providers will conduct a focused mental health evaluation, assessing for immediate safety concerns and further mental health needs. Referral: Our team will provide resources and help connect to community based mental health treatment, when indicated, including psychotherapy, psychiatry, and other specialized behavioral health or substance use disorder services (for those not already in treatment). Transitional Care: Our team providers in person bridging and/or telephonic follow-up during the patient's transition to outpatient services.   [facility]: [phone] [place]: [phone]  Discharge recommendations:  Patient is to take medications as prescribed. Please see information for follow-up appointment with psychiatry and therapy. Please follow up with your primary care provider for all medical related needs.   Therapy: We recommend that patient participate in individual therapy to address mental health concerns.  Medications: The parent/guardian is to contact a medical professional and/or outpatient provider to address any new side effects that develop. Parent/guardian should update outpatient providers of any new medications and/or medication changes.   Atypical antipsychotics: If you are prescribed an atypical antipsychotic, it is recommended that your height, weight, BMI, blood pressure, fasting lipid panel, and fasting blood sugar be monitored by your outpatient providers.  Safety:  The patient should abstain from use of illicit substances/drugs and abuse of any medications. If symptoms worsen or do not continue to improve or if the patient becomes actively suicidal or homicidal then it is recommended that the patient return to the closest hospital emergency department, the [facility], or call 911 for further evaluation and treatment. National Suicide Prevention Lifeline  1-800-SUICIDE or (539)514-1109.  About 988 988 offers 24/7 access to trained crisis counselors who can help people experiencing mental health-related distress. People can call or text 988 or chat 988lifeline.org for themselves or if they are worried about a loved one who may need crisis support.

## 2021-09-22 NOTE — Progress Notes (Signed)
CSW spoke with Loma Sousa at [facility] who states the [facility] facility does not allow patient's to detox there but states [facility] facility does. Patient aware of resources.  Madilyn Fireman, MSW, LCSW Transitions of Care   Clinical Social Worker II [phone]

## 2021-09-22 NOTE — Progress Notes (Signed)
CSW provided the following at the providers request for the patient to utilize to be connected to detox services. Further, CSW reached out to [facility] in [place], [place] to inquire about the process to obtaining substance abuse treatment at the facility. It was reported by Asher Muir that the patient can present to the facility to start the intake process. Nursing staff notified via secure chat.  Crissie Reese, MSW, LCSW-A, LCAS-A Phone: [phone] Disposition/TOC

## 2021-09-23 NOTE — ED Provider Notes (Signed)
Presented to ED with desire for alcohol deto. Had been seen at [facility] earlier today, and [facility] earlier today with same concerns.  Had previously reported drinking on librium taper.  His vital signs are stable. He had not reported SI/HI today to nursing, triage, or during his 2 other visits.  He eloped prior to my evaluation.    Gareth Morgan, MD 09/23/21 2208

## 2021-09-26 ENCOUNTER — Emergency Department (EMERGENCY_DEPARTMENT_HOSPITAL)
Admission: EM | Admit: 2021-09-26 | Discharge: 2021-09-27 | Disposition: A | Discharge status: Home / Self Care | Payer: Medicare Other | Source: Home / Self Care | Attending: Emergency Medicine | Admitting: Emergency Medicine

## 2021-09-26 ENCOUNTER — Emergency Department (HOSPITAL_COMMUNITY): Payer: Medicare Other

## 2021-09-26 ENCOUNTER — Other Ambulatory Visit: Payer: Self-pay

## 2021-09-26 ENCOUNTER — Encounter (HOSPITAL_COMMUNITY): Payer: Self-pay

## 2021-09-26 ENCOUNTER — Emergency Department (HOSPITAL_COMMUNITY)
Admission: EM | Admit: 2021-09-26 | Discharge: 2021-09-26 | Disposition: A | Discharge status: Home / Self Care | Payer: Medicare Other | Attending: Emergency Medicine | Admitting: Emergency Medicine

## 2021-09-26 ENCOUNTER — Other Ambulatory Visit (HOSPITAL_COMMUNITY): Payer: Self-pay

## 2021-09-26 DIAGNOSIS — R000 Tachycardia, unspecified: Secondary | ICD-10-CM | POA: Diagnosis not present

## 2021-09-26 DIAGNOSIS — F4310 Post-traumatic stress disorder, unspecified: Secondary | ICD-10-CM | POA: Diagnosis not present

## 2021-09-26 DIAGNOSIS — F333 Major depressive disorder, recurrent, severe with psychotic symptoms: Secondary | ICD-10-CM | POA: Diagnosis present

## 2021-09-26 DIAGNOSIS — I10 Essential (primary) hypertension: Secondary | ICD-10-CM | POA: Insufficient documentation

## 2021-09-26 DIAGNOSIS — R600 Localized edema: Secondary | ICD-10-CM | POA: Insufficient documentation

## 2021-09-26 DIAGNOSIS — R569 Unspecified convulsions: Secondary | ICD-10-CM | POA: Insufficient documentation

## 2021-09-26 DIAGNOSIS — Z79899 Other long term (current) drug therapy: Secondary | ICD-10-CM | POA: Insufficient documentation

## 2021-09-26 DIAGNOSIS — F29 Unspecified psychosis not due to a substance or known physiological condition: Secondary | ICD-10-CM | POA: Diagnosis not present

## 2021-09-26 DIAGNOSIS — Z20822 Contact with and (suspected) exposure to covid-19: Secondary | ICD-10-CM | POA: Insufficient documentation

## 2021-09-26 LAB — COMPREHENSIVE METABOLIC PANEL
ALT: 35 U/L (ref 0–44)
ALT: 33 U/L (ref 0–44)
AST: 24 U/L (ref 15–41)
AST: 30 U/L (ref 15–41)
Albumin: 3.7 g/dL (ref 3.5–5.0)
Albumin: 3.5 g/dL (ref 3.5–5.0)
Alkaline Phosphatase: 100 U/L (ref 38–126)
Alkaline Phosphatase: 99 U/L (ref 38–126)
Anion gap: 3 — ABNORMAL LOW (ref 5–15)
Anion gap: 8 (ref 5–15)
BUN: 7 mg/dL (ref 6–20)
BUN: 7 mg/dL (ref 6–20)
CO2: 28 mmol/L (ref 22–32)
CO2: 22 mmol/L (ref 22–32)
Calcium: 8 mg/dL — ABNORMAL LOW (ref 8.9–10.3)
Calcium: 8.1 mg/dL — ABNORMAL LOW (ref 8.9–10.3)
Chloride: 108 mmol/L (ref 98–111)
Chloride: 109 mmol/L (ref 98–111)
Creatinine, Ser: 0.95 mg/dL (ref 0.61–1.24)
Creatinine, Ser: 0.99 mg/dL (ref 0.61–1.24)
GFR, Estimated: >60 mL/min (ref >60)
GFR, Estimated: >60 mL/min (ref >60)
Glucose, Bld: 98 mg/dL (ref 70–99)
Glucose, Bld: 87 mg/dL (ref 70–99)
Potassium: 3.8 mmol/L (ref 3.5–5.1)
Potassium: 3.7 mmol/L (ref 3.5–5.1)
Sodium: 139 mmol/L (ref 135–145)
Sodium: 139 mmol/L (ref 135–145)
Total Bilirubin: 0.6 mg/dL (ref 0.3–1.2)
Total Bilirubin: 0.6 mg/dL (ref 0.3–1.2)
Total Protein: 6.7 g/dL (ref 6.5–8.1)
Total Protein: 6.3 g/dL — ABNORMAL LOW (ref 6.5–8.1)

## 2021-09-26 LAB — CBC WITH DIFFERENTIAL/PLATELET
Abs Immature Granulocytes: 0.01 10*3/uL (ref 0.00–0.07)
Abs Immature Granulocytes: 0.01 10*3/uL (ref 0.00–0.07)
Basophils Absolute: 0 10*3/uL (ref 0.0–0.1)
Basophils Absolute: 0 10*3/uL (ref 0.0–0.1)
Basophils Relative: 1 %
Basophils Relative: 1 %
Eosinophils Absolute: 0.2 10*3/uL (ref 0.0–0.5)
Eosinophils Absolute: 0.2 10*3/uL (ref 0.0–0.5)
Eosinophils Relative: 5 %
Eosinophils Relative: 4 %
HCT: 39.1 % (ref 39.0–52.0)
HCT: 38.8 % — ABNORMAL LOW (ref 39.0–52.0)
Hemoglobin: 12.4 g/dL — ABNORMAL LOW (ref 13.0–17.0)
Hemoglobin: 12.1 g/dL — ABNORMAL LOW (ref 13.0–17.0)
Immature Granulocytes: 0 %
Immature Granulocytes: 0 %
Lymphocytes Relative: 25 %
Lymphocytes Relative: 20 %
Lymphs Abs: 1.1 10*3/uL (ref 0.7–4.0)
Lymphs Abs: 0.9 10*3/uL (ref 0.7–4.0)
MCH: 27.4 pg (ref 26.0–34.0)
MCH: 26.9 pg (ref 26.0–34.0)
MCHC: 31.7 g/dL (ref 30.0–36.0)
MCHC: 31.2 g/dL (ref 30.0–36.0)
MCV: 86.5 fL (ref 80.0–100.0)
MCV: 86.4 fL (ref 80.0–100.0)
Monocytes Absolute: 0.5 10*3/uL (ref 0.1–1.0)
Monocytes Absolute: 0.4 10*3/uL (ref 0.1–1.0)
Monocytes Relative: 11 %
Monocytes Relative: 9 %
Neutro Abs: 2.6 10*3/uL (ref 1.7–7.7)
Neutro Abs: 3 10*3/uL (ref 1.7–7.7)
Neutrophils Relative %: 58 %
Neutrophils Relative %: 66 %
Platelets: 231 10*3/uL (ref 150–400)
Platelets: 226 10*3/uL (ref 150–400)
RBC: 4.52 MIL/uL (ref 4.22–5.81)
RBC: 4.49 MIL/uL (ref 4.22–5.81)
RDW: 14.8 % (ref 11.5–15.5)
RDW: 14.7 % (ref 11.5–15.5)
WBC: 4.4 10*3/uL (ref 4.0–10.5)
WBC: 4.5 10*3/uL (ref 4.0–10.5)
nRBC: 0 % (ref 0.0–0.2)
nRBC: 0 % (ref 0.0–0.2)

## 2021-09-26 LAB — RESP PANEL BY RT-PCR (FLU A&B, COVID) ARPGX2
Influenza A by PCR: NEGATIVE
Influenza B by PCR: NEGATIVE
SARS Coronavirus 2 by RT PCR: NEGATIVE

## 2021-09-26 LAB — RAPID URINE DRUG SCREEN, HOSP PERFORMED
Amphetamines: NOT DETECTED
Amphetamines: NOT DETECTED
Barbiturates: POSITIVE — AB
Barbiturates: POSITIVE — AB
Benzodiazepines: POSITIVE — AB
Benzodiazepines: POSITIVE — AB
Cocaine: NOT DETECTED
Cocaine: NOT DETECTED
Opiates: NOT DETECTED
Opiates: NOT DETECTED
Tetrahydrocannabinol: NOT DETECTED
Tetrahydrocannabinol: NOT DETECTED

## 2021-09-26 LAB — ETHANOL
Alcohol, Ethyl (B): <10 mg/dL (ref <10)
Alcohol, Ethyl (B): <10 mg/dL (ref <10)

## 2021-09-26 LAB — MAGNESIUM: Magnesium: 2.2 mg/dL (ref 1.7–2.4)

## 2021-09-26 MED ORDER — LORAZEPAM 2 MG/ML IJ SOLN
1.0000 mg | Freq: Once | INTRAMUSCULAR | Status: AC
Start: 2021-09-26 — End: 2021-09-26
  Administered 2021-09-26: 1 mg via INTRAVENOUS
  Filled 2021-09-26: qty 1

## 2021-09-26 MED ORDER — ZIPRASIDONE MESYLATE 20 MG IM SOLR
20.0000 mg | Freq: Once | INTRAMUSCULAR | Status: AC
Start: 2021-09-26 — End: 2021-09-26
  Administered 2021-09-26: 20 mg via INTRAMUSCULAR
  Filled 2021-09-26: qty 20

## 2021-09-26 MED ORDER — LORAZEPAM 1 MG PO TABS
0.0000 mg | ORAL_TABLET | Freq: Four times a day (QID) | ORAL | Status: DC
  Administered 2021-09-26: 2 mg via ORAL
  Administered 2021-09-27 (×2): 4 mg via ORAL
  Filled 2021-09-26: qty 4
  Filled 2021-09-26: qty 2
  Filled 2021-09-26: qty 4

## 2021-09-26 MED ORDER — LORAZEPAM 2 MG/ML IJ SOLN: 0.0000 mg | Freq: Two times a day (BID) | INTRAMUSCULAR | Status: DC

## 2021-09-26 MED ORDER — CHLORDIAZEPOXIDE HCL 25 MG PO CAPS
ORAL_CAPSULE | ORAL | 0 refills | Status: DC
  Filled 2021-09-26: qty 10, 2d supply, fill #0

## 2021-09-26 MED ORDER — THIAMINE HCL 100 MG/ML IJ SOLN: 100.0000 mg | Freq: Every day | INTRAMUSCULAR | Status: DC

## 2021-09-26 MED ORDER — LORAZEPAM 1 MG PO TABS: 0.0000 mg | ORAL_TABLET | Freq: Two times a day (BID) | ORAL | Status: DC

## 2021-09-26 MED ORDER — THIAMINE HCL 100 MG PO TABS
100.0000 mg | ORAL_TABLET | Freq: Every day | ORAL | Status: DC
  Administered 2021-09-27: 100 mg via ORAL
  Filled 2021-09-26: qty 1

## 2021-09-26 MED ORDER — SODIUM CHLORIDE 0.9 % IV BOLUS
500.0000 mL | Freq: Once | INTRAVENOUS | Status: AC
  Administered 2021-09-26: 500 mL via INTRAVENOUS

## 2021-09-26 MED ORDER — LORAZEPAM 2 MG/ML IJ SOLN
0.0000 mg | Freq: Four times a day (QID) | INTRAMUSCULAR | Status: DC
  Administered 2021-09-26: 2 mg via INTRAVENOUS

## 2021-09-26 NOTE — ED Triage Notes (Signed)
Pt BIB [facility] from hotel. States recently d/c from [facility] for DVT. Called for ETOH withdrawal. Hx of seizures. Seizure 2 days ago, last drink Friday. Reports head pain and leg pain. Rated pain 8/10. Wants to talk to dr and make sure he doesn't have another seizure.   CBG 116 Temp 97.9 SpO2 97% RA 116 palp RR 16 HR 64

## 2021-09-26 NOTE — ED Notes (Signed)
Patient awake and asked if able to provide urine sample, pt stated he's unable to at this time.

## 2021-09-26 NOTE — ED Notes (Signed)
Patient awake and participating in [facility] evaluation

## 2021-09-26 NOTE — ED Notes (Signed)
Patient requesting to leave. Informed that he must wait until the morning for his psychiatric evaluation

## 2021-09-26 NOTE — ED Provider Notes (Addendum)
[facility] DEPT Provider Note   CSN: [id number] Arrival date & time: 09/26/21  1623     History  Chief Complaint  Patient presents with   Seizures    [name] is a 51 y.o. male.  Patient seen earlier today.  The concern was may be for alcohol withdrawal.  Patient last stated had any alcohol 6 days ago.  This has been confirmed by his brother.  Patient has been evaluated by behavioral health and [facility] on January 22.  Again the focus seemed to be on alcohol related problems.  They documented at that time the patient was having auditory and visual hallucinations.  Had some tremors.  Patient was referred to [facility] outpatient services.  [name] was not suicidal or homicidal.  Family members state though that they think there is more to this that there is component of other behavioral health disease and perhaps psychosis.  Patient was found outside the ED banging his head against the concrete.  Voicing that [name] was having a seizure.  But not consistent with any true seizure activity.  Patient became combative when we tried to control him [name] had to go into restraints.  And required Geodon.  Patient after receiving the Geodon's told me that it is the best [name] is felt in a long time.  Patient reinforced that last alcohol was 6 days ago.  She should not be going through alcohol withdrawal.  Does sound like [name] has been receiving Librium on a regular basis for presumed alcohol withdrawal and has been taking that for about a month.  So [name] could be going through some benzo withdrawal.  Clinically it looked like [name] was not having seizures.  Seem to be more consistent with a psychosis.    Past medical history significant for bipolar disorder anxiety depression posttraumatic stress disorder hypertension cocaine abuse alcohol abuse and obesity.      Home Medications Prior to Admission medications   Medication Sig Start Date End Date Taking? Authorizing Provider  albuterol  (VENTOLIN HFA) 108 (90 Base) MCG/ACT inhaler Inhale 2 puffs into the lungs every 6 (six) hours as needed for wheezing or shortness of breath.    [provider]  amLODipine (NORVASC) 5 MG tablet Take 1 tablet (5 mg total) by mouth daily. 09/05/20   Davonna Belling, MD  chlordiazePOXIDE (LIBRIUM) 25 MG capsule Take 2 capsules by mouth 3 times a day for 1 day, then take 1-2 capsules by mouth 2 times daily for 1 day, then 1-2 capsules by mouth once daily for 1 day 09/26/21   Milton Ferguson, MD  cloNIDine (CATAPRES) 0.2 MG tablet Take 1 tablet (0.2 mg total) by mouth 3 (three) times daily as needed. 09/05/20   Davonna Belling, MD  doxepin (SINEQUAN) 10 MG capsule Take 1 capsule (10 mg total) by mouth at bedtime. 09/05/20   Davonna Belling, MD  escitalopram (LEXAPRO) 10 MG tablet Take 10 mg by mouth daily.    [provider]  hydrOXYzine (ATARAX) 25 MG tablet Take 1 tablet by mouth 3 (three) times daily as needed.    [provider]  ondansetron (ZOFRAN-ODT) 4 MG disintegrating tablet Take 1 tablet by mouth every 6 (six) hours as needed. 09/18/21   [provider]      Allergies    Metoprolol    Review of Systems   Review of Systems  Unable to perform ROS: Psychiatric disorder   Physical Exam Updated Vital Signs BP 125/63  Pulse 80    Resp 19    Ht 1.676 m (5\' 6" )    Wt 128.4 kg    SpO2 94%    BMI 45.68 kg/m  Physical Exam Vitals and nursing note reviewed.  Constitutional:      General: [name] is not in acute distress.    Appearance: Normal appearance. [name] is well-developed.  HENT:     Head: Normocephalic and atraumatic.  Eyes:     Extraocular Movements: Extraocular movements intact.     Conjunctiva/sclera: Conjunctivae normal.     Pupils: Pupils are equal, round, and reactive to light.  Cardiovascular:     Rate and Rhythm: Regular rhythm. Tachycardia present.     Heart sounds: No murmur heard. Pulmonary:     Effort: Pulmonary effort is normal. No  respiratory distress.     Breath sounds: Normal breath sounds.  Abdominal:     Palpations: Abdomen is soft.     Tenderness: There is no abdominal tenderness.  Musculoskeletal:        General: Swelling present.     Cervical back: Normal range of motion and neck supple.     Right lower leg: Edema present.     Left lower leg: Edema present.  Skin:    General: Skin is warm and dry.     Capillary Refill: Capillary refill takes less than 2 seconds.  Neurological:     General: No focal deficit present.     Mental Status: [name] is alert.     Comments: Patient awake.  Cannot communicate.  Does seem to have some evidence of formal thought disorder.  Denies any suicidal homicidal ideation  Psychiatric:        Mood and Affect: Mood normal.    ED Results / Procedures / Treatments   Labs (all labs ordered are listed, but only abnormal results are displayed) Labs Reviewed  CBC WITH DIFFERENTIAL/PLATELET - Abnormal; Notable for the following components:      Result Value   Hemoglobin 12.1 (*)    HCT 38.8 (*)    All other components within normal limits  COMPREHENSIVE METABOLIC PANEL - Abnormal; Notable for the following components:   Calcium 8.1 (*)    Total Protein 6.3 (*)    All other components within normal limits  MAGNESIUM  ETHANOL  RAPID URINE DRUG SCREEN, HOSP PERFORMED    EKG EKG Interpretation  Date/Time:  Thursday September 26 2021 16:53:23 EST Ventricular Rate:  84 PR Interval:  173 QRS Duration: 103 QT Interval:  384 QTC Calculation: 454 R Axis:   66 Text Interpretation: Sinus rhythm Consider left atrial enlargement Probable anteroseptal infarct, old No significant change since last tracing Confirmed by Fredia Sorrow [phone]) on 09/26/2021 5:09:07 PM  Radiology CT Head Wo Contrast  Result Date: 09/26/2021 CLINICAL DATA:  51 year old male presents with seizures, recent discharge from an outside hospital with history of DVT. EXAM: CT HEAD WITHOUT CONTRAST TECHNIQUE:  Contiguous axial images were obtained from the base of the skull through the vertex without intravenous contrast. RADIATION DOSE REDUCTION: This exam was performed according to the departmental dose-optimization program which includes automated exposure control, adjustment of the mA and/or kV according to patient size and/or use of iterative reconstruction technique. COMPARISON:  September 19, 2020. FINDINGS: Brain: No evidence of acute infarction, hemorrhage, hydrocephalus, extra-axial collection or mass lesion/mass effect. Mild generalized atrophy. Vascular: No hyperdense vessel or unexpected calcification. Skull: Normal. Negative for fracture or focal lesion. Sinuses/Orbits: Mild mucosal thickening of LEFT maxillary sinuses  and ethmoid sinuses without air-fluid level. Sinuses are incompletely visualized. Orbits are unremarkable. Other: None IMPRESSION: 1. No acute intracranial abnormality. 2. Mild generalized atrophy. 3. Mild mucosal thickening of LEFT maxillary sinuses and ethmoid sinuses without air-fluid level. Electronically Signed   By: Zetta Bills M.D.   On: 09/26/2021 11:18    Procedures Procedures  Cardiac monitor shows sinus rhythm.  Although initially tachycardic now not tachycardic.  Medications Ordered in ED Medications  LORazepam (ATIVAN) injection 0-4 mg (2 mg Intravenous Given 09/26/21 1635)    Or  LORazepam (ATIVAN) tablet 0-4 mg ( Oral See Alternative 09/26/21 1635)  LORazepam (ATIVAN) injection 0-4 mg (has no administration in time range)    Or  LORazepam (ATIVAN) tablet 0-4 mg (has no administration in time range)  thiamine tablet 100 mg (has no administration in time range)    Or  thiamine (B-1) injection 100 mg (has no administration in time range)  ziprasidone (GEODON) injection 20 mg (20 mg Intramuscular Given 09/26/21 1649)    ED Course/ Medical Decision Making/ A&P                           Medical Decision Making Amount and/or Complexity of Data Reviewed Labs:  ordered. Radiology: ordered.  Risk OTC drugs. Prescription drug management.  Patient did receive 1 mg of Ativan.  Patient required restraints.  Still did not settle down.  So patient was given 20 mg of Geodon.  Which made patient much more cooperative.  Heart rate came down with that.  Blood pressure was never significantly elevated.  Clinically it appears as if patient is having more of psychosis.  Then it certainly not consistent with alcohol withdrawal.  A benzo withdrawal is a possibility.  Do not think that [name] was having seizures at all clinically.  Patient's initial labs white count is 4.5 hemoglobin [id number] complete metabolic panel is normal including a GFR of greater than 60.  LFTs are normal.  Magnesium is normal alcohol was less than 10.  EKG heart rate was less than 100.  Is normal sinus rhythm.  No significant change from old EKGs.  Since patient was banging his head on the concrete.  We will get CT head.  Suspect patient will need evaluation by behavioral health once medically cleared.  Suspect [name] probably will be medically cleared.  Had a long conversation with his brother who is very concerned about behavioral health abnormalities not to substance abuse.    They had been in contact with old [facility] but I think this was more for alcohol withdrawal and they were willing to take the patient.    But then patient never got there [name] was waiting for his brother to pick him up and that is when the incident occurred outside the ED.  Patient has been IVC.  If head CT has no significant abnormalities patient is medically cleared for behavioral health evaluation.  Head CT without any acute abnormalities.  Patient medically cleared for behavioral health evaluation.  Final Clinical Impression(s) / ED Diagnoses Final diagnoses:  Psychosis, unspecified psychosis type [facility])    Rx / [facility] Orders ED Discharge Orders     None         Fredia Sorrow, MD 09/26/21 1815    Fredia Sorrow, MD 09/26/21 1815    Fredia Sorrow, MD 09/26/21 2209

## 2021-09-26 NOTE — BH Assessment (Signed)
Per RN, Pt is currently too somnolent to participate in tele-assessment. Assessment will be completed when Pt is able to participate.   Evelena Peat, [facility], [facility] Triage Specialist [phone]

## 2021-09-26 NOTE — ED Notes (Signed)
Pt brother has been allowed back to talk with pt then left to go get his medication that was prescribed earlier today.
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
## 2021-09-26 NOTE — ED Triage Notes (Signed)
Pt brought in from ED front sidewalk. Pt having "seizures".  Pt "jerking".  No foaming of the mouth, no incontinence, not postictal phase noted.  Pt talking immediately after "convulsion"

## 2021-09-26 NOTE — ED Notes (Signed)
Pt provided a ham sandwich, graham crackers and gingerale.

## 2021-09-26 NOTE — Discharge Instructions (Signed)
Follow-up with your family doctor next week for recheck.  Return if any problems °
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
## 2021-09-27 ENCOUNTER — Telehealth (HOSPITAL_COMMUNITY): Payer: Self-pay

## 2021-09-27 ENCOUNTER — Other Ambulatory Visit (HOSPITAL_COMMUNITY): Payer: Self-pay

## 2021-09-27 DIAGNOSIS — R569 Unspecified convulsions: Secondary | ICD-10-CM | POA: Diagnosis not present

## 2021-09-27 DIAGNOSIS — F333 Major depressive disorder, recurrent, severe with psychotic symptoms: Secondary | ICD-10-CM

## 2021-09-27 MED ORDER — ESCITALOPRAM OXALATE 10 MG PO TABS
10.0000 mg | ORAL_TABLET | Freq: Every day | ORAL | Status: DC
  Administered 2021-09-27: 10 mg via ORAL
  Filled 2021-09-27: qty 1

## 2021-09-27 NOTE — ED Provider Notes (Addendum)
Emergency Medicine Observation Re-evaluation Note  [name] is a 51 y.o. male, seen on rounds today.  Pt initially presented to the ED for complaints of Seizures Currently, the patient is awaiting psychiatric reevaluation this morning.  Physical Exam  BP 108/82    Pulse 77    Temp 97.6 F (36.4 C) (Oral)    Resp 18    Ht 5\' 6"  (1.676 m)    Wt 128.4 kg    SpO2 100%    BMI 45.68 kg/m  Physical Exam General: Resting Cardiac: No murmur Lungs: Lungs clear Psych: Resting  ED Course / MDM  EKG:EKG Interpretation  Date/Time:  Thursday September 26 2021 16:53:23 EST Ventricular Rate:  84 PR Interval:  173 QRS Duration: 103 QT Interval:  384 QTC Calculation: 454 R Axis:   66 Text Interpretation: Sinus rhythm Consider left atrial enlargement Probable anteroseptal infarct, old No significant change since last tracing Confirmed by 07-07-1986 [phone]) on 09/26/2021 5:09:07 PM  I have reviewed the labs performed to date as well as medications administered while in observation.  Recent changes in the last 24 hours include none.  Plan  Current plan is for waiting for psychiatric reevaluation to determine disposition. [name] is under involuntary commitment.      Halayna Blane, Aggie Hacker, MD 09/27/21 1006   12:02 PM I was informed that patient is now psychiatrically cleared and is stable for discharge for outpatient substance abuse follow-up.  They recommended IVC being rescinded which I filled out.  Anticipate discharge this afternoon.    Lin Glazier, 09/29/21, MD 09/27/21 1203        Avannah Decker, 09/29/21, MD 09/27/21 1320

## 2021-09-27 NOTE — ED Notes (Signed)
Shower now available. Pt escorted to shower by sitter and security.

## 2021-09-27 NOTE — ED Notes (Signed)
Pt out to charge desk asking multiple staff members to take a shower. Staff informed pt he will be able to go back shortly. Awaiting shower availability and the sitter to escort him back. Pt upset, requesting to speak to charge nurse. This Probation officer to speak with pt. Pt asked again when able to shower, explained again to pt when he was able to go back and shower in TCU. Pt not agreeable to answer. States he is "being denied" basic rights. Explained to patient further, he is not being denied, just need to wait for shower to become available. Pt states is patient rights are being taken away and he is "taking notes". Informed patient again we will get him back as soon as available and asked to return to room while waiting at this time.

## 2021-09-27 NOTE — BH Assessment (Signed)
Care Management - Follow Up Discharges   Writer attempted to make contact with patient today and was unsuccessful.  Phone number listed in epic is not a working number.   Per chart review, patient was provided with substance abuse outpatient resources.

## 2021-09-27 NOTE — BH Assessment (Addendum)
[facility] Assessment Progress Note   Per Caryn Bee, NP , this pt does not require psychiatric hospitalization at this time.  Pt presents under IVC initiated by EDP Vanetta Mulders, MD.  This writer has reached out to Nelly Rout, MD about rescinding IVC.  Pt is psychiatrically cleared.  At [facility] request, this Clinical research associate reached out to [facility], pt's preferred venue for substance use disorder treatment.  Call was placed at 10:40  Jamesetta So reports that pt will not be eligible for services from them until 10/10/2022.  Discharge instructions include referral information for several area substance use disorder treatment providers.  EDP Lynden Oxford, MD and pt's nurse, Dahlia Client, have been notified.  Doylene Canning, MA Triage Specialist [phone]   Addendum:  IVC has been rescinded by Dr Rush Landmark.  Doylene Canning, [place] Behavioral Health Coordinator [phone]

## 2021-09-27 NOTE — ED Notes (Signed)
Patient had lengthy conversation with charge nurse and was referred to patient advocate for his needs. Charge nurse provided pt with number for patient advocacy and ensured all his immediate needs were addressed. Patient requested sleeping medication and physician was notified of request.

## 2021-09-27 NOTE — Consult Note (Signed)
[facility] Psych ED Discharge  09/27/2021 10:12 AM [name]  MRN:  [id number] Principal Problem: <principal problem not specified> Discharge Diagnoses: Active Problems:   * No active hospital problems. *   Subjective: Much better. I am ready to go.   [name], 51 y.o., male patient admitted to [facility] ED after presenting with complaints of alcohol use disorder and requesting short-term or long-term rehab assistance.  Patient seen via tele psych by this provider, consulted with Dr. Dwyane Dee; and chart reviewed on 09/27/21.  He states he came to the ED as he was experiencing alcohol withdraw complications and needed to seek medical attention. He endorses feeling better in the ED, and requesting assistance to a long term or short term rehabilitation facility. He state he had great experience at [facility] and would like to go back there if possible.    During evaluation Marcus TANGONAN is sitting on side of bed in no acute distress.  He is alert, oriented x 4, calm and cooperative.  His mood is euthymic with congruent affect.  He does not appear to be responding to internal/external stimuli or delusional thoughts.  Patient denies suicidal/self-harm/homicidal ideation, psychosis, and paranoia.  Patient answered question appropriately.  Will attempt to begin conversation with [facility] facility.   Total Time spent with patient: 30 minutes  Past Psychiatric History: See above  Past Medical History:  Past Medical History:  Diagnosis Date   Alcohol dependence with withdrawal ([place])    Anxiety    Asthma    Atrial fibrillation (HCC)    not on AC   Bipolar 1 disorder (HCC)    Class 2 obesity    Cocaine abuse (HCC)    Depression    Heart palpitations    with severe anxiety   Hypertension    PTSD (post-traumatic stress disorder)    PTSD (post-traumatic stress disorder)     Past Surgical History:  Procedure Laterality Date   NO PAST SURGERIES     Family History:  Family History   Problem Relation Age of Onset   Alcohol abuse Mother    Hypertension Father    Dementia Father    Alcohol abuse Maternal Grandfather    Family Psychiatric  History: None reported Social History:  Social History   Substance and Sexual Activity  Alcohol Use Yes   Comment: 5-7 locos/day     Social History   Substance and Sexual Activity  Drug Use Yes   Types: Cocaine    Social History   Socioeconomic History   Marital status: Single    Spouse name: Not on file   Number of children: Not on file   Years of education: Not on file   Highest education level: Not on file  Occupational History   Occupation: disabled  Tobacco Use   Smoking status: Never   Smokeless tobacco: Never  Vaping Use   Vaping Use: Never used  Substance and Sexual Activity   Alcohol use: Yes    Comment: 5-7 locos/day   Drug use: Yes    Types: Cocaine   Sexual activity: Not Currently    Comment: pt said he has an enlarged prostate  Other Topics Concern   Not on file  Social History Narrative   ** Merged History Encounter **       Social Determinants of Health   Financial Resource Strain: Not on file  Food Insecurity: Not on file  Transportation Needs: Not on file  Physical Activity: Not on file  Stress: Not on file  Social Connections: Not on file    Has this patient used any form of tobacco in the last 30 days? (Cigarettes, Smokeless Tobacco, Cigars, and/or Pipes) Prescription not provided because: Patient does not use tobacco products  Current Medications: Current Facility-Administered Medications  Medication Dose Route Frequency Provider Last Rate Last Admin   escitalopram (LEXAPRO) tablet 10 mg  10 mg Oral QHS Veryl Speak, MD   10 mg at 09/27/21 0112   LORazepam (ATIVAN) injection 0-4 mg  0-4 mg Intravenous Q6H Fredia Sorrow, MD   2 mg at 09/26/21 1635   Or   LORazepam (ATIVAN) tablet 0-4 mg  0-4 mg Oral Q6H Fredia Sorrow, MD   4 mg at 09/27/21 0515   [START ON 09/29/2021]  LORazepam (ATIVAN) injection 0-4 mg  0-4 mg Intravenous Q12H Fredia Sorrow, MD       Or   Derrill Memo ON 09/29/2021] LORazepam (ATIVAN) tablet 0-4 mg  0-4 mg Oral Q12H Fredia Sorrow, MD       thiamine tablet 100 mg  100 mg Oral Daily Fredia Sorrow, MD       Or   thiamine (B-1) injection 100 mg  100 mg Intravenous Daily Fredia Sorrow, MD       Current Outpatient Medications  Medication Sig Dispense Refill   albuterol (VENTOLIN HFA) 108 (90 Base) MCG/ACT inhaler Inhale 2 puffs into the lungs every 6 (six) hours as needed for wheezing or shortness of breath.     amLODipine (NORVASC) 5 MG tablet Take 1 tablet (5 mg total) by mouth daily. 30 tablet 0   chlordiazePOXIDE (LIBRIUM) 25 MG capsule Take 2 capsules by mouth 3 times a day for 1 day, then take 1-2 capsules by mouth 2 times daily for 1 day, then 1-2 capsules by mouth once daily for 1 day 10 capsule 0   cloNIDine (CATAPRES) 0.2 MG tablet Take 1 tablet (0.2 mg total) by mouth 3 (three) times daily as needed. 12 tablet 0   doxepin (SINEQUAN) 10 MG capsule Take 1 capsule (10 mg total) by mouth at bedtime. 30 capsule 0   escitalopram (LEXAPRO) 10 MG tablet Take 10 mg by mouth daily.     hydrOXYzine (ATARAX) 25 MG tablet Take 1 tablet by mouth 3 (three) times daily as needed.     ondansetron (ZOFRAN-ODT) 4 MG disintegrating tablet Take 1 tablet by mouth every 6 (six) hours as needed.     PTA Medications: (Not in a hospital admission)   Musculoskeletal: Strength & Muscle Tone: within normal limits Gait & Station: normal Patient leans: N/A  Psychiatric Specialty Exam: Physical Exam Vitals and nursing note reviewed. Exam conducted with a chaperone present.  Constitutional:      General: He is not in acute distress.    Appearance: Normal appearance. He is not ill-appearing.  HENT:     Head: Normocephalic.  Cardiovascular:     Rate and Rhythm: Normal rate.  Pulmonary:     Effort: Pulmonary effort is normal.  Musculoskeletal:         General: Normal range of motion.     Cervical back: Normal range of motion.  Neurological:     Mental Status: He is alert and oriented to person, place, and time.  Psychiatric:        Attention and Perception: Attention and perception normal. He does not perceive auditory or visual hallucinations.        Mood and Affect: Mood and affect normal.  Speech: Speech normal.        Behavior: Behavior normal. Behavior is cooperative.        Thought Content: Thought content normal. Thought content is not paranoid or delusional. Thought content does not include homicidal or suicidal ideation.        Cognition and Memory: Cognition and memory normal.        Judgment: Judgment normal.    Review of Systems  Constitutional: Negative.   HENT: Negative.    Eyes: Negative.   Respiratory: Negative.    Cardiovascular: Negative.   Gastrointestinal: Negative.   Genitourinary: Negative.   Musculoskeletal: Negative.   Skin: Negative.   Neurological: Negative.   Hematological: Negative.   Psychiatric/Behavioral:  Negative for agitation, behavioral problems, confusion, hallucinations, self-injury and sleep disturbance. Suicidal ideas: Denies.The patient is not nervous/anxious.    Blood pressure 108/82, pulse 77, temperature 97.6 F (36.4 C), temperature source Oral, resp. rate 18, height 5\' 6"  (1.676 m), weight 128.4 kg, SpO2 100 %.Body mass index is 45.68 kg/m.  General Appearance: Casual  Eye Contact:  Good  Speech:  Clear and Coherent and Normal Rate  Volume:  Normal  Mood:  Euthymic  Affect:  Appropriate and Congruent  Thought Process:  Coherent, Goal Directed and Descriptions of Associations: Intact  Orientation:  Full (Time, Place, and Person)  Thought Content:  WDL  Suicidal Thoughts:  No  Homicidal Thoughts:  No  Memory:  Immediate;   Good Recent;   Good  Judgement:  Intact  Insight:  Present  Psychomotor Activity:  Normal  Concentration:  Concentration: Good and Attention Span:  Good  Recall:  Good  Fund of Knowledge:  Good  Language:  Good  Akathisia:  No  Handed:  Right  AIMS (if indicated):     Assets:  Communication Skills Desire for Improvement Social Support  ADL's:  Intact  Cognition:  WNL  Sleep:        Demographic Factors:  Male and Unemployed  Loss Factors: NA  Historical Factors: Impulsivity  Risk Reduction Factors:   Religious beliefs about death and Positive social support  Continued Clinical Symptoms:  Alcohol/Substance Abuse/Dependencies Previous Psychiatric Diagnoses and Treatments  Cognitive Features That Contribute To Risk:  None    Suicide Risk:  Minimal: No identifiable suicidal ideation.  Patients presenting with no risk factors but with morbid ruminations; may be classified as minimal risk based on the severity of the depressive symptoms  Plan Of Care/Follow-up recommendations:  Activity:  As tolerated Diet:  Heart healthy    TOC ordered to assist with community services for substance use disorder, and rehab services. [facility] disposition coordinator will assist with referral.   Disposition: Patient psychiatrically cleared No evidence of imminent risk to self or others at present.   Patient does not meet criteria for psychiatric inpatient admission. Supportive therapy provided about ongoing stressors. Refer to IOP. Discussed crisis plan, support from social network, calling 911, coming to the Emergency Department, and calling Suicide Hotline.   Suella Broad, FNP 09/27/2021, 10:12 AM

## 2021-09-27 NOTE — ED Notes (Signed)
4 patient belongings bags and 1 black duffle bag moved to hall C pt belongings cupboard.

## 2021-09-27 NOTE — ED Notes (Signed)
Pt's 4 belongings bags and black duffel bag returned to pt.

## 2021-09-27 NOTE — ED Notes (Signed)
Patient tells this RN that he is upset with how he was treated earlier. He states that a member of security said that they would use their baton on him. Security at this facility do not have batons or any sort of weapons. He also states that staff members were talking bad about him with his brother. He seems to be focused on previous staff. He is calm and redirectable with this shift at this time. He was provided with a card for the Office of Patient Experience at his request.

## 2021-09-27 NOTE — ED Notes (Signed)
Pt to charge desk again, states he wants to know why he has still not been escorted to take a shower. Pt cont stating "you're refusing me my rights" "I can't kill myself with soap, just let me go" Pt informed again that he will go back to shower as soon as able. Pt states that he has been "lied to" by all staff. Asked pt to return to room. Pt refused. Security called to charge desk.

## 2021-09-27 NOTE — ED Notes (Signed)
Pt pulled his IV out. Stated he is nervous

## 2021-09-27 NOTE — Discharge Instructions (Addendum)
To help you maintain a sober lifestyle, a substance use disorder treatment program may be beneficial to you.  Contact one of the following providers at your earliest opportunity to ask about enrolling in their program:  RESIDENTIAL REHAB:       [facility]      [address] [place], [place] [address]      [phone]       [facility]      [address]      [place], [place] [address]      [phone]       [facility]      [address]      [place], [place] [address]      [phone]       [facility]      [address]      [place], [place] [address]      [phone]       [facility]      [address], [place] [address]      [phone]       [facility]      [address]      [place], [place] [address]      [phone]      Please note that you will next be eligible for services from them after October 10, 2022  CHEMICAL DEPENDENCY INTENSIVE OUTPATIENT PROGRAMS:       [facility]      [address], [place] [address]      [phone]       [facility]      [address]      [place], [place] [address]      [phone]

## 2021-09-29 ENCOUNTER — Emergency Department (HOSPITAL_BASED_OUTPATIENT_CLINIC_OR_DEPARTMENT_OTHER)
Admission: EM | Admit: 2021-09-29 | Discharge: 2021-09-30 | Disposition: A | Discharge status: Home / Self Care | Payer: Medicare Other | Attending: Emergency Medicine | Admitting: Emergency Medicine

## 2021-09-29 ENCOUNTER — Encounter (HOSPITAL_BASED_OUTPATIENT_CLINIC_OR_DEPARTMENT_OTHER): Payer: Self-pay | Admitting: Emergency Medicine

## 2021-09-29 ENCOUNTER — Other Ambulatory Visit: Payer: Self-pay

## 2021-09-29 DIAGNOSIS — Z7951 Long term (current) use of inhaled steroids: Secondary | ICD-10-CM | POA: Insufficient documentation

## 2021-09-29 DIAGNOSIS — R569 Unspecified convulsions: Secondary | ICD-10-CM | POA: Diagnosis not present

## 2021-09-29 DIAGNOSIS — F1010 Alcohol abuse, uncomplicated: Secondary | ICD-10-CM | POA: Insufficient documentation

## 2021-09-29 DIAGNOSIS — Z79899 Other long term (current) drug therapy: Secondary | ICD-10-CM | POA: Insufficient documentation

## 2021-09-29 DIAGNOSIS — R251 Tremor, unspecified: Secondary | ICD-10-CM | POA: Diagnosis not present

## 2021-09-29 MED ORDER — LORAZEPAM 1 MG PO TABS
2.0000 mg | ORAL_TABLET | Freq: Once | ORAL | Status: AC
Start: 2021-09-29 — End: 2021-09-29
  Administered 2021-09-29: 2 mg via ORAL
  Filled 2021-09-29: qty 2

## 2021-09-29 NOTE — ED Provider Notes (Signed)
[facility] Provider Note   CSN: [id number] Arrival date & time: 09/29/21  2239     History  Chief Complaint  Patient presents with   Alcohol Problem    [name] is a 51 y.o. male.  Patient brought to the emergency department by ambulance.  Patient reports that he is here for help with alcohol withdrawal.  He reports that he was recently admitted to [facility] but discharged too early.  He reports that he is having some shakes and he believes he may have had a seizure.  This is happened before with alcohol withdrawal.  He says he has not had any alcohol in 2 days.  When he was discharged from [facility] he was given a prescription for Librium, but was told at the pharmacy that it could not be filled.      Home Medications Prior to Admission medications   Medication Sig Start Date End Date Taking? Authorizing Provider  LORazepam (ATIVAN) 1 MG tablet Take 2 tablets (2 mg total) by mouth every 8 (eight) hours for 1 day, THEN 1 tablet (1 mg total) every 8 (eight) hours for 1 day, THEN 1 tablet (1 mg total) every 12 (twelve) hours for 1 day. 09/30/21 10/03/21 Yes Mung Rinker, Gwenyth Allegra, MD  albuterol (VENTOLIN HFA) 108 (90 Base) MCG/ACT inhaler Inhale 2 puffs into the lungs every 6 (six) hours as needed for wheezing or shortness of breath.    [provider]  amLODipine (NORVASC) 5 MG tablet Take 1 tablet (5 mg total) by mouth daily. 09/05/20   Davonna Belling, MD  cloNIDine (CATAPRES) 0.2 MG tablet Take 1 tablet (0.2 mg total) by mouth 3 (three) times daily as needed. 09/05/20   Davonna Belling, MD  doxepin (SINEQUAN) 10 MG capsule Take 1 capsule (10 mg total) by mouth at bedtime. 09/05/20   Davonna Belling, MD  escitalopram (LEXAPRO) 10 MG tablet Take 10 mg by mouth daily.    [provider]  hydrOXYzine (ATARAX) 25 MG tablet Take 1 tablet by mouth 3 (three) times daily as needed.    [provider]   ondansetron (ZOFRAN-ODT) 4 MG disintegrating tablet Take 1 tablet by mouth every 6 (six) hours as needed. 09/18/21   [provider]      Allergies    Metoprolol    Review of Systems   Review of Systems  Neurological:  Positive for tremors and seizures.   Physical Exam Updated Vital Signs BP (!) 130/92 (BP Location: Right Arm)    Pulse 69    Temp 98.6 F (37 C) (Oral)    Resp 18    Ht 5\' 7"  (1.702 m)    Wt 127.9 kg    SpO2 100%    BMI 44.17 kg/m  Physical Exam Vitals and nursing note reviewed.  Constitutional:      General: He is not in acute distress.    Appearance: He is well-developed.  HENT:     Head: Normocephalic and atraumatic.  Eyes:     Conjunctiva/sclera: Conjunctivae normal.  Cardiovascular:     Rate and Rhythm: Normal rate and regular rhythm.     Heart sounds: No murmur heard. Pulmonary:     Effort: Pulmonary effort is normal. No respiratory distress.     Breath sounds: Normal breath sounds.  Abdominal:     Palpations: Abdomen is soft.     Tenderness: There is no abdominal tenderness.  Musculoskeletal:  General: No swelling.     Cervical back: Neck supple.  Skin:    General: Skin is warm and dry.     Capillary Refill: Capillary refill takes less than 2 seconds.  Neurological:     Mental Status: He is alert.  Psychiatric:        Mood and Affect: Mood normal.        Thought Content: Thought content is not paranoid or delusional. Thought content does not include homicidal or suicidal ideation. Thought content does not include suicidal plan.    ED Results / Procedures / Treatments   Labs (all labs ordered are listed, but only abnormal results are displayed) Labs Reviewed - No data to display  EKG None  Radiology No results found.  Procedures Procedures    Medications Ordered in ED Medications  LORazepam (ATIVAN) tablet 2 mg (2 mg Oral Given 09/29/21 2311)    ED Course/ Medical Decision Making/ A&P                            Medical Decision Making Risk Prescription drug management.   Patient appears well.  Vital signs are unremarkable.  No sign of severe withdrawal.  Patient reports that he was unable to get his Librium prescribed because of the Medicaid issue.  He will be monitored overnight and discharged in the morning.  A prescription for Ativan taper was sent to his pharmacy.  Patient has not homicidal or suicidal.  He has no other complaints currently.  Does not require repeat work-up today.        Final Clinical Impression(s) / ED Diagnoses Final diagnoses:  Alcohol abuse    Rx / DC Orders ED Discharge Orders          Ordered    LORazepam (ATIVAN) 1 MG tablet        09/30/21 0017              Orpah Greek, MD 09/30/21 [id number]

## 2021-09-29 NOTE — ED Triage Notes (Signed)
Patient rambling in triage.  Reports he was kicked out tonight and had multiple seizures today.

## 2021-09-29 NOTE — ED Triage Notes (Signed)
Pt reports swelling to R foot. And "cardiac chest pain" with exertion. Per [facility] he is here for ETOH detox and R foot swelling. Pt states he walked from [place] to [employer].

## 2021-09-30 ENCOUNTER — Other Ambulatory Visit (HOSPITAL_BASED_OUTPATIENT_CLINIC_OR_DEPARTMENT_OTHER): Payer: Self-pay

## 2021-09-30 MED ORDER — LORAZEPAM 1 MG PO TABS: ORAL_TABLET | ORAL | 0 refills | Status: DC

## 2021-09-30 MED ORDER — LORAZEPAM 1 MG PO TABS
ORAL_TABLET | ORAL | 0 refills | Status: AC
  Filled 2021-09-30: qty 11, 3d supply, fill #0

## 2021-09-30 NOTE — ED Notes (Signed)
Patient provided crackers and drink.

## 2021-09-30 NOTE — ED Notes (Signed)
EDP at bedside  

## 2021-09-30 NOTE — ED Notes (Signed)
CN to room to talk with patient about his concerns. Pt requesting to speak with EDP. EDP aware.

## 2021-10-01 ENCOUNTER — Other Ambulatory Visit (HOSPITAL_COMMUNITY): Payer: Self-pay

## 2021-10-01 ENCOUNTER — Emergency Department (HOSPITAL_COMMUNITY)
Admission: EM | Admit: 2021-10-01 | Discharge: 2021-10-02 | Discharge status: Against Medical Advice | Payer: Medicare Other | Source: Home / Self Care

## 2021-10-01 ENCOUNTER — Other Ambulatory Visit (HOSPITAL_BASED_OUTPATIENT_CLINIC_OR_DEPARTMENT_OTHER): Payer: Self-pay

## 2021-10-01 ENCOUNTER — Encounter (HOSPITAL_COMMUNITY): Payer: Self-pay | Admitting: Emergency Medicine

## 2021-10-01 ENCOUNTER — Other Ambulatory Visit: Payer: Self-pay

## 2021-10-01 ENCOUNTER — Emergency Department (HOSPITAL_COMMUNITY)
Admission: EM | Admit: 2021-10-01 | Discharge: 2021-10-01 | Disposition: A | Discharge status: Home / Self Care | Payer: Medicare Other | Attending: Emergency Medicine | Admitting: Emergency Medicine

## 2021-10-01 DIAGNOSIS — R200 Anesthesia of skin: Secondary | ICD-10-CM | POA: Insufficient documentation

## 2021-10-01 DIAGNOSIS — R079 Chest pain, unspecified: Secondary | ICD-10-CM | POA: Insufficient documentation

## 2021-10-01 DIAGNOSIS — Z5321 Procedure and treatment not carried out due to patient leaving prior to being seen by health care provider: Secondary | ICD-10-CM | POA: Insufficient documentation

## 2021-10-01 DIAGNOSIS — E860 Dehydration: Secondary | ICD-10-CM | POA: Insufficient documentation

## 2021-10-01 DIAGNOSIS — M25512 Pain in left shoulder: Secondary | ICD-10-CM | POA: Insufficient documentation

## 2021-10-01 DIAGNOSIS — R531 Weakness: Secondary | ICD-10-CM | POA: Insufficient documentation

## 2021-10-01 DIAGNOSIS — Z760 Encounter for issue of repeat prescription: Secondary | ICD-10-CM | POA: Insufficient documentation

## 2021-10-01 DIAGNOSIS — M79602 Pain in left arm: Secondary | ICD-10-CM | POA: Insufficient documentation

## 2021-10-01 MED ORDER — CHLORDIAZEPOXIDE HCL 25 MG PO CAPS: ORAL_CAPSULE | ORAL | 0 refills | Status: DC

## 2021-10-01 MED ORDER — CHLORDIAZEPOXIDE HCL 25 MG PO CAPS
ORAL_CAPSULE | ORAL | 0 refills | Status: DC
  Filled 2021-10-01: qty 11, 5d supply, fill #0

## 2021-10-01 NOTE — ED Triage Notes (Signed)
Pt reports medication ref[facility] [place].

## 2021-10-01 NOTE — ED Provider Triage Note (Signed)
Emergency Medicine Provider Triage Evaluation Note  [name] , a 51 y.o. male  was evaluated in triage. Pt has multiple complaints. He was just seen at [facility] for medication refill and said he was discharged too soon due to rude staff. He is complaining of chest pain making it difficult to use his L arm, R finger numbness, and dehydration.   Review of Systems  Positive: As above Negative: Cough, fever  Physical Exam  BP (!) 121/91 (BP Location: Left Arm)    Pulse 83    Temp 98.4 F (36.9 C) (Oral)    Resp 15    SpO2 95%  Gen:   Awake, no distress   Resp:  Normal effort  MSK:   Moves extremities without difficulty  Other:  No facial droop, no slurred speech, in no acute distress  Medical Decision Making  Medically screening exam initiated at 7:51 PM.  Appropriate orders placed.  [name] was informed that the remainder of the evaluation will be completed by another provider, this initial triage assessment does not replace that evaluation, and the importance of remaining in the ED until their evaluation is complete.  Patient is very well-known to this department as he has had 18 ER visits this month alone.  He says yes to pretty much any ROS question that I ask.  He states that he walked a long ways from [place] today, and is feeling very tired since then.  He is tolerating p.o. and is drinking Gatorade in triage.  I have low concern for acute pathology of his symptoms as he is moving all extremities without difficulty.   Su Monks, Cordelia Poche 10/01/21 2006

## 2021-10-01 NOTE — ED Provider Notes (Signed)
[name]   CSN: [id number] Arrival date & time: 10/01/21  1157     History Chief Complaint  Patient presents with   Medication Refill    [name] is a 51 y.o. male presents emergency department for medication refill.  Patient has history of alcohol abuse and was prescribed Librium at [facility] that it was sent to patient reports he was unable to fill it.  He is requesting a Librium refill at this time.  He denies any tremors, syncope, or hallucinations.  His last drink was 2 days ago.  Recently discharged from [facility] for seizure.   Medication Refill     Home Medications Prior to Admission medications   Medication Sig Start Date End Date Taking? Authorizing Provider  albuterol (VENTOLIN HFA) 108 (90 Base) MCG/ACT inhaler Inhale 2 puffs into the lungs every 6 (six) hours as needed for wheezing or shortness of breath.    [provider]  amLODipine (NORVASC) 5 MG tablet Take 1 tablet (5 mg total) by mouth daily. 09/05/20   Davonna Belling, MD  cloNIDine (CATAPRES) 0.2 MG tablet Take 1 tablet (0.2 mg total) by mouth 3 (three) times daily as needed. 09/05/20   Davonna Belling, MD  doxepin (SINEQUAN) 10 MG capsule Take 1 capsule (10 mg total) by mouth at bedtime. 09/05/20   Davonna Belling, MD  escitalopram (LEXAPRO) 10 MG tablet Take 10 mg by mouth daily.    [provider]  hydrOXYzine (ATARAX) 25 MG tablet Take 1 tablet by mouth 3 (three) times daily as needed.    [provider]  LORazepam (ATIVAN) 1 MG tablet Take 2 tablets (2 mg total) by mouth every 8 (eight) hours for 1 day, THEN 1 tablet (1 mg total) every 8 (eight) hours for 1 day, THEN 1 tablet (1 mg total) every 12 (twelve) hours for 1 day. 09/30/21 10/03/21  Orpah Greek, MD  ondansetron (ZOFRAN-ODT) 4 MG disintegrating tablet Take 1 tablet by mouth every 6 (six) hours as needed. 09/18/21   [provider]      Allergies    Metoprolol    Review of Systems   Review of Systems  All other systems reviewed and are negative.  Physical Exam Updated Vital Signs BP (!) 157/106 (BP Location: Right Arm)    Pulse 86    Temp 98.8 F (37.1 C) (Oral)    Resp 16    SpO2 93%  Physical Exam Vitals and nursing [name] reviewed.  Constitutional:      General: He is not in acute distress.    Appearance: He is not toxic-appearing.  Eyes:     General: No scleral icterus. Cardiovascular:     Rate and Rhythm: Normal rate and regular rhythm.  Pulmonary:     Effort: Pulmonary effort is normal. No respiratory distress.  Skin:    General: Skin is dry.     Findings: No rash.  Neurological:     General: No focal deficit present.     Mental Status: He is alert. Mental status is at baseline.     Comments: No tremors.  Patient does not appear to be responding to any internal stimuli.  Psychiatric:        Mood and Affect: Mood normal.    ED Results / Procedures / Treatments   Labs (all labs ordered are listed, but only abnormal results are displayed) Labs Reviewed - No data to display  EKG  None  Radiology No results found.  Procedures Procedures   Medications Ordered in ED Medications - No data to display  ED Course/ Medical Decision Making/ A&P                           Medical Decision Making Risk Prescription drug management.   51 year old male presents emergency department for refill of Librium.  On chart review, patient's been seen in the emergency department numerous times over the past few weeks.  He appears to be going to different emergency departments and numerous health hospital systems.  I did recommend PDMP, the patient's been prescribed Librium multiple times including Ativan at the last few encounters.  On questioning, the patient reports that the hospital at [facility] took his Ativan.  Read the [name] from [facility] and they mentioned that the patient  Reporting that the staff there took his Ativan.  The patient reports that he does not have the Ativan on him and has been taken by staff members at [facility] emergency department.  The patient does not appear acutely intoxicated and is not displaying any tremulous behavior.  Called the [facility] at the Librium was sent to and spoke to the pharmacist who reported that there was not a completed signature on how to dose the medication so they were unable to get in contact with the provider to fill it.  Additionally, I spoke to bedside at [facility] who reports to me that the patient did in fact pick up his Ativan. Patient reports his last prescription of Librium was dropped the ground and he was unable to take it.  After discussion with my attending, will give patient short course of Librium for his alcohol use disorder.  Return precautions given for any DT symptoms and signs.  Patient agrees to plan.  Patient is stable and being discharged home in good condition.  New -prescription was sent to [place] is out of Librium.  [facility] [address].  Final Clinical Impression(s) / ED Diagnoses Final diagnoses:  Medication refill    Rx / DC Orders ED Discharge Orders          Ordered    chlordiazePOXIDE (LIBRIUM) 25 MG capsule  Status:  Discontinued        10/01/21 1422    chlordiazePOXIDE (LIBRIUM) 25 MG capsule        10/01/21 1552              Sherrell Puller, PA-C 10/02/21 1650    Pattricia Boss, MD 10/03/21 1016

## 2021-10-01 NOTE — Discharge Instructions (Addendum)
You were seen here today for medication refill. Your Librium is being sent to [facility]. Additionally, I have attached detox resources as well as additional information on Librium to this discharge paperwork. If you have any seizures, uncontrollable nausea/vomiting, return to the ER.

## 2021-10-01 NOTE — ED Triage Notes (Signed)
Pt c/o L arm/shoulder pain "from carrying bag," legs feel "weak" from walking. Pt ambulatory in triage, NAD noted.

## 2021-10-02 ENCOUNTER — Emergency Department (HOSPITAL_COMMUNITY)
Admission: EM | Admit: 2021-10-02 | Discharge: 2021-10-02 | Discharge status: Against Medical Advice | Payer: Medicare Other | Source: Home / Self Care | Attending: Emergency Medicine | Admitting: Emergency Medicine

## 2021-10-02 ENCOUNTER — Other Ambulatory Visit: Payer: Self-pay

## 2021-10-02 ENCOUNTER — Emergency Department (HOSPITAL_BASED_OUTPATIENT_CLINIC_OR_DEPARTMENT_OTHER): Payer: Medicare Other

## 2021-10-02 ENCOUNTER — Emergency Department (HOSPITAL_BASED_OUTPATIENT_CLINIC_OR_DEPARTMENT_OTHER)
Admission: EM | Admit: 2021-10-02 | Discharge: 2021-10-02 | Disposition: A | Discharge status: Home / Self Care | Payer: Medicare Other | Attending: Emergency Medicine | Admitting: Emergency Medicine

## 2021-10-02 ENCOUNTER — Encounter (HOSPITAL_BASED_OUTPATIENT_CLINIC_OR_DEPARTMENT_OTHER): Payer: Self-pay | Admitting: *Deleted

## 2021-10-02 ENCOUNTER — Telehealth (HOSPITAL_BASED_OUTPATIENT_CLINIC_OR_DEPARTMENT_OTHER): Payer: Self-pay | Admitting: Pharmacist

## 2021-10-02 ENCOUNTER — Other Ambulatory Visit (HOSPITAL_BASED_OUTPATIENT_CLINIC_OR_DEPARTMENT_OTHER): Payer: Self-pay

## 2021-10-02 DIAGNOSIS — J069 Acute upper respiratory infection, unspecified: Secondary | ICD-10-CM | POA: Insufficient documentation

## 2021-10-02 DIAGNOSIS — Y900 Blood alcohol level of less than 20 mg/100 ml: Secondary | ICD-10-CM | POA: Insufficient documentation

## 2021-10-02 DIAGNOSIS — U071 COVID-19: Secondary | ICD-10-CM | POA: Insufficient documentation

## 2021-10-02 DIAGNOSIS — F1010 Alcohol abuse, uncomplicated: Secondary | ICD-10-CM | POA: Insufficient documentation

## 2021-10-02 DIAGNOSIS — R059 Cough, unspecified: Secondary | ICD-10-CM | POA: Diagnosis present

## 2021-10-02 DIAGNOSIS — I10 Essential (primary) hypertension: Secondary | ICD-10-CM | POA: Insufficient documentation

## 2021-10-02 DIAGNOSIS — Z79899 Other long term (current) drug therapy: Secondary | ICD-10-CM | POA: Insufficient documentation

## 2021-10-02 DIAGNOSIS — Z139 Encounter for screening, unspecified: Secondary | ICD-10-CM

## 2021-10-02 LAB — CBC WITH DIFFERENTIAL/PLATELET
Abs Immature Granulocytes: 0.01 10*3/uL (ref 0.00–0.07)
Basophils Absolute: 0 10*3/uL (ref 0.0–0.1)
Basophils Relative: 0 %
Eosinophils Absolute: 0.2 10*3/uL (ref 0.0–0.5)
Eosinophils Relative: 4 %
HCT: 42.4 % (ref 39.0–52.0)
Hemoglobin: 13.5 g/dL (ref 13.0–17.0)
Immature Granulocytes: 0 %
Lymphocytes Relative: 30 %
Lymphs Abs: 1.4 10*3/uL (ref 0.7–4.0)
MCH: 27.3 pg (ref 26.0–34.0)
MCHC: 31.8 g/dL (ref 30.0–36.0)
MCV: 85.8 fL (ref 80.0–100.0)
Monocytes Absolute: 0.8 10*3/uL (ref 0.1–1.0)
Monocytes Relative: 16 %
Neutro Abs: 2.3 10*3/uL (ref 1.7–7.7)
Neutrophils Relative %: 50 %
Platelets: 209 10*3/uL (ref 150–400)
RBC: 4.94 MIL/uL (ref 4.22–5.81)
RDW: 14.2 % (ref 11.5–15.5)
WBC: 4.6 10*3/uL (ref 4.0–10.5)
nRBC: 0 % (ref 0.0–0.2)

## 2021-10-02 LAB — COMPREHENSIVE METABOLIC PANEL
ALT: 16 U/L (ref 0–44)
AST: 21 U/L (ref 15–41)
Albumin: 3.7 g/dL (ref 3.5–5.0)
Alkaline Phosphatase: 105 U/L (ref 38–126)
Anion gap: 9 (ref 5–15)
BUN: 7 mg/dL (ref 6–20)
CO2: 26 mmol/L (ref 22–32)
Calcium: 8.2 mg/dL — ABNORMAL LOW (ref 8.9–10.3)
Chloride: 103 mmol/L (ref 98–111)
Creatinine, Ser: 0.98 mg/dL (ref 0.61–1.24)
GFR, Estimated: >60 mL/min (ref >60)
Glucose, Bld: 92 mg/dL (ref 70–99)
Potassium: 3.7 mmol/L (ref 3.5–5.1)
Sodium: 138 mmol/L (ref 135–145)
Total Bilirubin: 0.6 mg/dL (ref 0.3–1.2)
Total Protein: 6.9 g/dL (ref 6.5–8.1)

## 2021-10-02 LAB — RESP PANEL BY RT-PCR (FLU A&B, COVID) ARPGX2
Influenza A by PCR: NEGATIVE
Influenza B by PCR: NEGATIVE
SARS Coronavirus 2 by RT PCR: POSITIVE — AB

## 2021-10-02 LAB — ETHANOL: Alcohol, Ethyl (B): <10 mg/dL (ref <10)

## 2021-10-02 MED ORDER — CHLORDIAZEPOXIDE HCL 25 MG PO CAPS
25.0000 mg | ORAL_CAPSULE | Freq: Once | ORAL | Status: AC
  Administered 2021-10-02: 25 mg via ORAL
  Filled 2021-10-02: qty 1

## 2021-10-02 MED ORDER — PAXLOVID (300/100) 20 X 150 MG & 10 X 100MG PO TBPK
ORAL_TABLET | ORAL | 0 refills | Status: DC
  Filled 2021-10-02: qty 30, 5d supply, fill #0

## 2021-10-02 NOTE — ED Provider Notes (Signed)
[facility] EMERGENCY DEPT Provider Note   CSN: [id number] Arrival date & time: 10/02/21  [id number]     History  Chief Complaint  Patient presents with   URI   Medical Clearance    [name] is a 51 y.o. male.  Patient presents to ER for cough and congestion ongoing since yesterday.  He has been seen in the ER several times now for the past 3 days.  He states that he was released from a rehab facility for alcohol abuse.  Last drink of alcohol was 5 days ago.  He denies any fevers.  Positive cough no vomiting or diarrhea reported.      Home Medications Prior to Admission medications   Medication Sig Start Date End Date Taking? Authorizing Provider  albuterol (VENTOLIN HFA) 108 (90 Base) MCG/ACT inhaler Inhale 2 puffs into the lungs every 6 (six) hours as needed for wheezing or shortness of breath.    [provider]  amLODipine (NORVASC) 5 MG tablet Take 1 tablet (5 mg total) by mouth daily. 09/05/20   Davonna Belling, MD  chlordiazePOXIDE (LIBRIUM) 25 MG capsule 50mg  PO TID x 1D, then 25mg  PO BID X 1D, then 25mg  PO QD X 1D 10/01/21   Sherrell Puller, PA-C  cloNIDine (CATAPRES) 0.2 MG tablet Take 1 tablet (0.2 mg total) by mouth 3 (three) times daily as needed. 09/05/20   Davonna Belling, MD  diclofenac (VOLTAREN) 50 MG EC tablet Take 50 mg by mouth 3 (three) times daily. 09/29/21   [provider]  doxepin (SINEQUAN) 10 MG capsule Take 1 capsule (10 mg total) by mouth at bedtime. 09/05/20   Davonna Belling, MD  escitalopram (LEXAPRO) 10 MG tablet Take 10 mg by mouth daily.    [provider]  hydrochlorothiazide (HYDRODIURIL) 25 MG tablet Take 25 mg by mouth every morning. 09/29/21   [provider]  hydrOXYzine (ATARAX) 25 MG tablet Take 1 tablet by mouth 3 (three) times daily as needed.    [provider]  hydrOXYzine (VISTARIL) 25 MG capsule Take 50 mg by mouth 3 (three) times daily. 09/23/21   [provider]   LORazepam (ATIVAN) 1 MG tablet Take 2 tablets (2 mg total) by mouth every 8 (eight) hours for 1 day, THEN 1 tablet (1 mg total) every 8 (eight) hours for 1 day, THEN 1 tablet (1 mg total) every 12 (twelve) hours for 1 day. 09/30/21 10/03/21  Orpah Greek, MD  ondansetron (ZOFRAN-ODT) 4 MG disintegrating tablet Take 1 tablet by mouth every 6 (six) hours as needed. 09/18/21   [provider]      Allergies    Metoprolol    Review of Systems   Review of Systems  Constitutional:  Negative for fever.  HENT:  Negative for ear pain and sore throat.   Eyes:  Negative for pain.  Respiratory:  Positive for cough.   Cardiovascular:  Negative for chest pain.  Gastrointestinal:  Negative for abdominal pain.  Genitourinary:  Negative for flank pain.  Musculoskeletal:  Negative for back pain.  Skin:  Negative for color change and rash.  Neurological:  Negative for syncope.  All other systems reviewed and are negative.  Physical Exam Updated Vital Signs BP (!) 137/97    Pulse 86    Temp 98.1 F (36.7 C) (Oral)    Resp 16    Wt 127.9 kg    SpO2 96%    BMI 44.17 kg/m  Physical Exam Constitutional:  Appearance: He is well-developed.  HENT:     Head: Normocephalic.     Nose: Nose normal.  Eyes:     Extraocular Movements: Extraocular movements intact.  Cardiovascular:     Rate and Rhythm: Normal rate.  Pulmonary:     Effort: Pulmonary effort is normal.  Skin:    Coloration: Skin is not jaundiced.  Neurological:     General: No focal deficit present.     Mental Status: He is alert and oriented to person, place, and time. Mental status is at baseline.     Cranial Nerves: No cranial nerve deficit.    ED Results / Procedures / Treatments   Labs (all labs ordered are listed, but only abnormal results are displayed) Labs Reviewed  RESP PANEL BY RT-PCR (FLU A&B, COVID) [id number]    EKG None  Radiology DG Chest Port 1 View  Result Date: 10/02/2021 CLINICAL DATA:  cough  EXAM: PORTABLE CHEST 1 VIEW COMPARISON:  09/23/2021. FINDINGS: No consolidation. No visible pleural effusions or pneumothorax. Enlarged cardiac silhouette, similar to prior. IMPRESSION: No evidence of acute cardiopulmonary disease. Electronically Signed   By: Margaretha Sheffield M.D.   On: 10/02/2021 10:22    Procedures Procedures    Medications Ordered in ED Medications  chlordiazePOXIDE (LIBRIUM) capsule 25 mg (25 mg Oral Given 10/02/21 1008)    ED Course/ Medical Decision Making/ A&P                           Medical Decision Making Amount and/or Complexity of Data Reviewed Radiology: ordered.  Risk Prescription drug management.   Vital signs within normal limits.  Patient has no resting tremors.  No hallucinations reported.  Last drink of alcohol was 5 days ago which is near the end of any alcohol withdrawal symptoms I suspect.  Chest x-ray is unremarkable.  COVID panel sent and pending.  Discharged home in stable condition.        Final Clinical Impression(s) / ED Diagnoses Final diagnoses:  Upper respiratory tract infection, unspecified type    Rx / DC Orders ED Discharge Orders     None         Luna Fuse, MD 10/02/21 1129

## 2021-10-02 NOTE — ED Triage Notes (Signed)
Pt is here from home with URI and congestion for the past 8-9 hours.  Pt has a runny nose and reports some body aches.  Pt attributes this to lack of heat at his home.  Pt also notes that he has been having alcohol withdrawal and his last drink of etoh was Friday.

## 2021-10-02 NOTE — ED Provider Triage Note (Cosign Needed)
Emergency Medicine Provider Triage Evaluation Note  CASIN SCHOLTES , a 51 y.o. male  was evaluated in triage.  Pt complains of alcohol withdrawals.  States last use was 2 days ago.  He was at [facility] earlier today for cough and chest tightness.  Diagnosed with COVID.  Was seen at [facility] yesterday and prescribed Librium.  States that he has been unable to pick this up.  Multiple ED visits in the past several days.  Review of Systems  Positive: Cough Negative: Syncope  Physical Exam  BP (!) 126/95 (BP Location: Right Arm)    Pulse 86    Temp 98 F (36.7 C) (Oral)    Resp 16    Ht 5\' 7"  (1.702 m)    Wt 130.6 kg    SpO2 99%    BMI 45.11 kg/m  Gen:   Awake, no distress   Resp:  Normal effort  MSK:   Moves extremities without difficulty  Other:    Medical Decision Making  Medically screening exam initiated at 4:40 PM.  Appropriate orders placed.  NATHAN PINSON was informed that the remainder of the evaluation will be completed by another provider, this initial triage assessment does not replace that evaluation, and the importance of remaining in the ED until their evaluation is complete.     Carlisle Cater, PA-C 10/02/21 1643

## 2021-10-02 NOTE — ED Provider Notes (Signed)
[facility] Provider Note   CSN: [id number] Arrival date & time: 10/02/21  1549     History  Chief Complaint  Patient presents with   Alcohol Problem    [name] is a 51 y.o. male with PMHx HTN, A fib, polysubstance abuse, and alcohol abuse who presents to the ED today for alcohol problem. Pt reports he is going through withdrawals currently. He states he is having visual hallucinations and tremors. He reports his last drink was 2 days ago. He is requesting a prescription for Librium at this time. He states he was supposed to get a "30 day taper" that was ready for pick up today at the pharmacy however when he went he was told that they were out of Librium. Pt states he demanded to speak to the charge nurse of the pharmacy and he filed a complaint.   Per chart review: Pt was seen at [facility] earlier today for URI symptoms however at that time reported his last drink was 5 days ago. He was provided a dose of librium and tested positive for COVID. Discharged home.   Has had multiple ED visits for both detox and alcohol abuse. Continues to drink despite Librium prescriptions below.   PDMP: Starting 09/01/21 pt has had 6 Rx of Librium picked up as well as Ativan. Most recently 2 days ago.   09/30/2021  09/30/2021   2  Lorazepam 1 Mg Tablet 11.00  3  Ch Pol  [id number]   Med (5269)  0/0  3.67 LME  Other  [place]    09/28/2021  09/27/2021   4  Chlordiazepoxide 25 Mg Capsule 13.00  2  Da And  [id number]   Nor (0439)  0/0  6.50 LME  Medicare  [place]    09/26/2021  09/26/2021   2  Chlordiazepoxide 25 Mg Capsule 10.00  2  Dorian HeckleJo Zam  [id number]   Wes (0126)  0/0  5.00 LME  Other  [place]    09/23/2021  09/22/2021   4  Chlordiazepoxide 25 Mg Capsule 16.00  3  Myrlene BrokerJo Coo  [id number]   Nor [phone](0439)  0/0  5.33 LME  Medicare  [place]    09/17/2021  09/17/2021   4  Chlordiazepoxide 25 Mg Capsule 10.00  3  Er Con  [id number]   Nor (4142)  0/0  3.33 LME  Medicare  [place]    09/13/2021   09/13/2021   2  Chlordiazepoxide 25 Mg Capsule 10.00  3  Bo Tra  [id number]   Wes (0126)  0/0  3.33 LME  Other  [place]    09/02/2021  09/02/2021   4  Chlordiazepoxide 25 Mg Capsule 16.00  3  Myrlene BrokerJo Coo  [phone]   Nor [phone](0439)  0/0  5.33 LME  Medicare  [place]     The history is provided by the patient and medical records.      Home Medications Prior to Admission medications   Medication Sig Start Date End Date Taking? Authorizing Provider  albuterol (VENTOLIN HFA) 108 (90 Base) MCG/ACT inhaler Inhale 2 puffs into the lungs every 6 (six) hours as needed for wheezing or shortness of breath.    [provider]  amLODipine (NORVASC) 5 MG tablet Take 1 tablet (5 mg total) by mouth daily. 09/05/20   Benjiman CorePickering, Nathan, MD  chlordiazePOXIDE (LIBRIUM) 25 MG capsule 50mg  PO TID x 1D, then 25mg  PO BID X 1D, then 25mg  PO QD X 1D 10/01/21   Alison Murrayansom,  Victory Dakin, PA-C  cloNIDine (CATAPRES) 0.2 MG tablet Take 1 tablet (0.2 mg total) by mouth 3 (three) times daily as needed. 09/05/20   Benjiman Core, MD  diclofenac (VOLTAREN) 50 MG EC tablet Take 50 mg by mouth 3 (three) times daily. 09/29/21   [provider]  doxepin (SINEQUAN) 10 MG capsule Take 1 capsule (10 mg total) by mouth at bedtime. 09/05/20   Benjiman Core, MD  escitalopram (LEXAPRO) 10 MG tablet Take 10 mg by mouth daily.    [provider]  hydrochlorothiazide (HYDRODIURIL) 25 MG tablet Take 25 mg by mouth every morning. 09/29/21   [provider]  hydrOXYzine (ATARAX) 25 MG tablet Take 1 tablet by mouth 3 (three) times daily as needed.    [provider]  hydrOXYzine (VISTARIL) 25 MG capsule Take 50 mg by mouth 3 (three) times daily. 09/23/21   [provider]  LORazepam (ATIVAN) 1 MG tablet Take 2 tablets (2 mg total) by mouth every 8 (eight) hours for 1 day, THEN 1 tablet (1 mg total) every 8 (eight) hours for 1 day, THEN 1 tablet (1 mg total) every 12 (twelve) hours for 1 day. 09/30/21 10/03/21  Gilda Crease,  MD  nirmatrelvir & ritonavir (PAXLOVID, 300/100,) 20 x 150 MG & 10 x 100MG  TBPK Take 3 tablets by mouth twice daily. 10/02/21   11/30/21, MD  ondansetron (ZOFRAN-ODT) 4 MG disintegrating tablet Take 1 tablet by mouth every 6 (six) hours as needed. 09/18/21   [provider]      Allergies    Metoprolol    Review of Systems   Review of Systems  Constitutional:  Negative for fever.  Neurological:  Positive for tremors.  Psychiatric/Behavioral:  Positive for hallucinations.   All other systems reviewed and are negative.  Physical Exam Updated Vital Signs BP (!) 130/95 (BP Location: Right Arm)    Pulse 86    Temp 98 F (36.7 C) (Oral)    Resp 16    Ht 5\' 7"  (1.702 m)    Wt 130.6 kg    SpO2 99%    BMI 45.11 kg/m  Physical Exam Vitals and nursing note reviewed.  Constitutional:      Appearance: He is not ill-appearing or diaphoretic.     Comments: No tremors appreciated  HENT:     Head: Normocephalic and atraumatic.  Eyes:     Conjunctiva/sclera: Conjunctivae normal.  Cardiovascular:     Rate and Rhythm: Normal rate and regular rhythm.     Pulses: Normal pulses.  Pulmonary:     Effort: Pulmonary effort is normal.     Breath sounds: Normal breath sounds. No wheezing, rhonchi or rales.  Abdominal:     Palpations: Abdomen is soft.     Tenderness: There is no abdominal tenderness.  Musculoskeletal:     Cervical back: Neck supple.  Skin:    General: Skin is warm and dry.  Neurological:     Mental Status: He is alert.    ED Results / Procedures / Treatments   Labs (all labs ordered are listed, but only abnormal results are displayed) Labs Reviewed  COMPREHENSIVE METABOLIC PANEL - Abnormal; Notable for the following components:      Result Value   Calcium 8.2 (*)    All other components within normal limits  CBC WITH DIFFERENTIAL/PLATELET  ETHANOL    EKG None  Radiology DG Chest Port 1 View  Result Date: 10/02/2021 CLINICAL DATA:  cough EXAM: PORTABLE  CHEST  1 VIEW COMPARISON:  09/23/2021. FINDINGS: No consolidation. No visible pleural effusions or pneumothorax. Enlarged cardiac silhouette, similar to prior. IMPRESSION: No evidence of acute cardiopulmonary disease. Electronically Signed   By: Feliberto Harts M.D.   On: 10/02/2021 10:22    Procedures Procedures    Medications Ordered in ED Medications - No data to display  ED Course/ Medical Decision Making/ A&P                           Medical Decision Making 51 year old male presenting to the ED requesting Librium Rx for alcohol withdrawal. Reports last drink 2 days ago. Multiple ED visits recently for both alcohol intoxication and withdrawal symptoms. Has picked up 6 prescriptions for Librium last month as well as additional Ativan prescription as recent as 2 days ago. Seen at [facility] earlier today and reported last drink 5 days ago.   On arrival to the ED vitals are stable. Pt without tachycardia or tachypnea. Blood pressure normotensive. On my exam he is resting comfortably. He is nontremulous. Non diaphoretic. Does not appear to be withdrawing at this time. Labs were collected while in the waiting room - without acute abnormalities at this time. Attempted to perform CIWA assessment on patient however he became agitated and requested to speak with charge nurse. While I was speaking with nursing staff regarding this patient apparently left the facility. There is concern that he is abusing Librium at this time and showing high risk behavior by continuing to drink on same. Do not feel he clinically is in withdrawal today.           Final Clinical Impression(s) / ED Diagnoses Final diagnoses:  Alcohol abuse  Encounter for medical screening examination    Rx / DC Orders ED Discharge Orders     None         Tanda Rockers, PA-C 10/02/21 1853    Benjiman Core, MD 10/03/21 1426

## 2021-10-02 NOTE — ED Notes (Signed)
Pt concerned about librium prescription.      Pt has been interacting w/ staff and ambulating w/o difficulty.  NAD noted.

## 2021-10-02 NOTE — Telephone Encounter (Signed)
Outpatient Pharmacy Oral COVID Treatment Note  I connected with [name] on 10/02/2021/2:44 PM in person after his visit to the [facility] and verified that I am speaking with the correct person using two identifiers.  I discussed the limitations, risks, security, and privacy concerns of performing an evaluation and management service by telephone and the availability of in person appointments via referral to a physician. The patient expressed understanding and agreed to proceed.  Pharmacy location: [place] [facility]  Diagnosis: COVID-19 infection  Purpose of visit: Discussion of potential use of Paxlovid, a new treatment for mild to moderate COVID-19 viral infection in non-hospitalized patients.  Subjective/Objective: Patient is a 51 y.o. male who is presenting with COVID 19 viral infection.  COVID 19 viral infection. Their symptoms began this morning with chest congestoin.  The patient has confirmed COVID-19 via a test downstairs in our emergency department.    Past Medical History:  Diagnosis Date   Alcohol dependence with withdrawal ([place])    Anxiety    Asthma    Atrial fibrillation (HCC)    not on AC   Bipolar 1 disorder (HCC)    Class 2 obesity    Cocaine abuse ([place])    Depression    Heart palpitations    with severe anxiety   Hypertension    PTSD (post-traumatic stress disorder)    PTSD (post-traumatic stress disorder)      Allergies  Allergen Reactions   Metoprolol Other (See Comments)    Other reaction(s): Other Prostate problem Other reaction(s): Other Prostate problem     Current Outpatient Medications:    albuterol (VENTOLIN HFA) 108 (90 Base) MCG/ACT inhaler, Inhale 2 puffs into the lungs every 6 (six) hours as needed for wheezing or shortness of breath., Disp: , Rfl:    amLODipine (NORVASC) 5 MG tablet, Take 1 tablet (5 mg total) by mouth daily., Disp: 30 tablet, Rfl: 0   chlordiazePOXIDE (LIBRIUM) 25 MG  capsule, 54m PO TID x 1D, then 271mPO BID X 1D, then 2590mO QD X 1D, Disp: 9 capsule, Rfl: 0   cloNIDine (CATAPRES) 0.2 MG tablet, Take 1 tablet (0.2 mg total) by mouth 3 (three) times daily as needed., Disp: 12 tablet, Rfl: 0   diclofenac (VOLTAREN) 50 MG EC tablet, Take 50 mg by mouth 3 (three) times daily., Disp: , Rfl:    doxepin (SINEQUAN) 10 MG capsule, Take 1 capsule (10 mg total) by mouth at bedtime., Disp: 30 capsule, Rfl: 0   escitalopram (LEXAPRO) 10 MG tablet, Take 10 mg by mouth daily., Disp: , Rfl:    hydrochlorothiazide (HYDRODIURIL) 25 MG tablet, Take 25 mg by mouth every morning., Disp: , Rfl:    hydrOXYzine (ATARAX) 25 MG tablet, Take 1 tablet by mouth 3 (three) times daily as needed., Disp: , Rfl:    hydrOXYzine (VISTARIL) 25 MG capsule, Take 50 mg by mouth 3 (three) times daily., Disp: , Rfl:    LORazepam (ATIVAN) 1 MG tablet, Take 2 tablets (2 mg total) by mouth every 8 (eight) hours for 1 day, THEN 1 tablet (1 mg total) every 8 (eight) hours for 1 day, THEN 1 tablet (1 mg total) every 12 (twelve) hours for 1 day., Disp: 11 tablet, Rfl: 0   nirmatrelvir & ritonavir (PAXLOVID, 300/100,) 20 x 150 MG & 10 x 100MG TBPK, Take 3 tablets by mouth twice daily., Disp: 30 tablet, Rfl: 0   ondansetron (ZOFRAN-ODT) 4 MG disintegrating tablet, Take 1  tablet by mouth every 6 (six) hours as needed., Disp: , Rfl:  No current facility-administered medications for this visit.  Lab Monitoring: eGFR over 90   Plan:  This patient is a 51 y.o. male that meets the criteria for Emergency Use Authorization of Paxlovid. After reviewing the emergency use authorization with the patient, the patient agrees to receive Paxlovid.  Through FDA guidance and current [place] standing order Paxlovid will be prescribed to the patient.   Patient contacted for counseling on 10/02/21 and verbalized understanding.   Delivery or Pick-Up Date: 10/02/21  Follow up instructions:    Take prescription BID x 5  days as directed Counseling was provided by pharmacist. Reach out to pharmacist with follow up questions For concerns regarding further COVID symptoms please follow up with your PCP or urgent care For urgent or life-threatening issues, seek care at your local emergency department   Margie Ege 10/02/2021, 2:44 [place] Pharmacist Phone# [phone]

## 2021-10-02 NOTE — ED Notes (Signed)
Pt came to desk asking how to leave.  States he feels he is getting the runaround and just wants to leave.  Primary RN Percival Spanish is aware and ok with me DC AMA.

## 2021-10-02 NOTE — ED Notes (Signed)
S1 s2 hear pulses and cap refill good

## 2021-10-02 NOTE — ED Notes (Signed)
Pt reports that he was seen in ED in [place] and given librium for etoh withdrawal but reports that his brother took the librium away from him.  He reports that his housing has a non functional bathroom without heat and he was not feeling well and the house was very cold.  Last drink was Friday, CIWA 0.  Denies any SI

## 2021-10-02 NOTE — Care Management (Signed)
ED RNCM  noted patient was seen and discharged  from [place] today. Patient has returned to [facility] ED stating that he has not  gotten his full dose of librium from pharmacy.  ED RNCM called to verify with [facility] and confirmed that patient did pick up medication yesterday.  ED RNCM offered to discuss detox and drug and alcohol treatment centers which accepts his insurance, patient states, he know the places to go,  but  he is here again because he needs the rest  of his meds.  ED CM explained that the EDP will come to evaluate him.  Updated Charge nurse on patient in HB-18,  RNCM will return to follow up, once patient is evaluated.

## 2021-10-02 NOTE — ED Triage Notes (Signed)
Patient BIB [facility] from home, compliant of alcohol abuse and being covid positive. VSS. NAD.

## 2021-10-02 NOTE — ED Notes (Signed)
Pt came to the nurses' station upset that the PA mentioned him needing "an assessment."  Pt started asking for the Charge RN or "Assistant."  This Clinical research associate asked the Pt if I could clarify his concern prior to getting the Consulting civil engineer.  Pt stated he felt like he was "getting the run around" and only needed the prescription.  This Clinical research associate educated him that nurses cannot write prescriptions so the Charge RN would not be able to help with that and I would clarify what assessment the PA needed.   Then, this Clinical research associate went to the Provider office.  After determining the PA wanted a CIWA, this writer returned to the bedside to find the Pt had left.  The Pt was found at another nurses' station asking to leave AMA.  This Clinical research associate explained that the assessment was only a CIWA score, but the Pt was still asking to leave.  Pt stating he "is getting the run around."  PA informed of Pt's decision.

## 2021-10-02 NOTE — ED Triage Notes (Addendum)
Pt here stating he is having ETOH  withdrawal. Pt states he drinks everyday. 4X 4LOCO. Last drink 2 days ago. Pt states he was suppose to detox at [facility] but was removed because the RN didn't like him. Pt reports he is COVID positive.  Pt reports cough started today. Pt during triage stating conflicting stories. Blood pressure in the 200's and he has heart issues d/t his pressure in the 60's. Flight of ideas, with rapid speech.

## 2021-10-02 NOTE — ED Notes (Addendum)
Pt calm demeanor and oxygenation good. Pt advised to quarantine and return if symptoms did not improve

## 2021-10-02 NOTE — ED Notes (Signed)
Phone call rec from clients brother, he was very concerned why the pt was not being admitted due to being Covid Positive, explained to family member that currently pt does not meet admission criteria. I also informed the pt brother to contact social services of [place] to aid in obtaining medical assistance that would of benefit to him and what he has availability of due his Medicare/Medicaid/ food stamp recipient. Brother seems very upset that we did not provide him pox device for home monitoring or admitted. AVS was again reviewed with pt on CDC guidelines for being Covid Positive. Opportunity for questions provided prior to DC to home.

## 2021-10-02 NOTE — Discharge Instructions (Signed)
Your x-ray was normal.  You can follow-up your COVID results on your MyChart app.  Continue to isolate until you see the results.  Return if you have difficulty breathing or any worsening symptoms.  Follow-up with your doctor this week.

## 2021-10-03 ENCOUNTER — Encounter (HOSPITAL_COMMUNITY): Payer: Self-pay

## 2021-10-03 ENCOUNTER — Telehealth: Payer: Self-pay | Admitting: Surgery

## 2021-10-03 ENCOUNTER — Emergency Department (HOSPITAL_COMMUNITY)
Admission: EM | Admit: 2021-10-03 | Discharge: 2021-10-03 | Disposition: A | Discharge status: Home / Self Care | Payer: Medicare Other | Attending: Student | Admitting: Student

## 2021-10-03 DIAGNOSIS — Y900 Blood alcohol level of less than 20 mg/100 ml: Secondary | ICD-10-CM | POA: Insufficient documentation

## 2021-10-03 DIAGNOSIS — R443 Hallucinations, unspecified: Secondary | ICD-10-CM | POA: Diagnosis not present

## 2021-10-03 DIAGNOSIS — F10239 Alcohol dependence with withdrawal, unspecified: Secondary | ICD-10-CM | POA: Insufficient documentation

## 2021-10-03 DIAGNOSIS — Z5321 Procedure and treatment not carried out due to patient leaving prior to being seen by health care provider: Secondary | ICD-10-CM | POA: Diagnosis not present

## 2021-10-03 DIAGNOSIS — R519 Headache, unspecified: Secondary | ICD-10-CM | POA: Diagnosis not present

## 2021-10-03 DIAGNOSIS — U071 COVID-19: Secondary | ICD-10-CM | POA: Diagnosis not present

## 2021-10-03 LAB — COMPREHENSIVE METABOLIC PANEL
ALT: 16 U/L (ref 0–44)
AST: 19 U/L (ref 15–41)
Albumin: 3.3 g/dL — ABNORMAL LOW (ref 3.5–5.0)
Alkaline Phosphatase: 97 U/L (ref 38–126)
Anion gap: 7 (ref 5–15)
BUN: 10 mg/dL (ref 6–20)
CO2: 28 mmol/L (ref 22–32)
Calcium: 8.1 mg/dL — ABNORMAL LOW (ref 8.9–10.3)
Chloride: 103 mmol/L (ref 98–111)
Creatinine, Ser: 1.06 mg/dL (ref 0.61–1.24)
GFR, Estimated: >60 mL/min (ref >60)
Glucose, Bld: 123 mg/dL — ABNORMAL HIGH (ref 70–99)
Potassium: 3.9 mmol/L (ref 3.5–5.1)
Sodium: 138 mmol/L (ref 135–145)
Total Bilirubin: 0.4 mg/dL (ref 0.3–1.2)
Total Protein: 6.4 g/dL — ABNORMAL LOW (ref 6.5–8.1)

## 2021-10-03 LAB — CBC WITH DIFFERENTIAL/PLATELET
Abs Immature Granulocytes: 0.01 10*3/uL (ref 0.00–0.07)
Basophils Absolute: 0 10*3/uL (ref 0.0–0.1)
Basophils Relative: 1 %
Eosinophils Absolute: 0.2 10*3/uL (ref 0.0–0.5)
Eosinophils Relative: 6 %
HCT: 40.1 % (ref 39.0–52.0)
Hemoglobin: 12.5 g/dL — ABNORMAL LOW (ref 13.0–17.0)
Immature Granulocytes: 0 %
Lymphocytes Relative: 41 %
Lymphs Abs: 1.5 10*3/uL (ref 0.7–4.0)
MCH: 27.1 pg (ref 26.0–34.0)
MCHC: 31.2 g/dL (ref 30.0–36.0)
MCV: 86.8 fL (ref 80.0–100.0)
Monocytes Absolute: 0.6 10*3/uL (ref 0.1–1.0)
Monocytes Relative: 17 %
Neutro Abs: 1.3 10*3/uL — ABNORMAL LOW (ref 1.7–7.7)
Neutrophils Relative %: 35 %
Platelets: 194 10*3/uL (ref 150–400)
RBC: 4.62 MIL/uL (ref 4.22–5.81)
RDW: 14.4 % (ref 11.5–15.5)
WBC: 3.7 10*3/uL — ABNORMAL LOW (ref 4.0–10.5)
nRBC: 0 % (ref 0.0–0.2)

## 2021-10-03 LAB — ETHANOL: Alcohol, Ethyl (B): <10 mg/dL (ref <10)

## 2021-10-03 NOTE — ED Provider Triage Note (Signed)
Emergency Medicine Provider Triage Evaluation Note  [name] , a 51 y.o. male  was evaluated in triage.  Pt complains of alcohol withdrawal.  Patient has been seen for similar multiple times recently.  Patient's story keeps changing, reports he is here because he had a seizure from alcohol initially said this was 3 days ago than stated it was this morning.  No seizure activity witnessed with EMS.  Patient has difficulty providing additional information.  He reports that no one will give him Librium but patient has received 7 prescriptions for Librium in the past month and was seen for similar complaints yesterday at 2 facilities.  He complains of hallucination, tremors and headache.  It is very difficult to get him to answer questions and he will to not provide answers to any of my other questions in triage  Review of Systems  Positive: Hallucination, tremors, headache Negative: Fevers  Physical Exam  BP 102/69    Pulse 60    Resp 20    SpO2 95%  Gen:   Awake Resp:  Normal effort  MSK:   Moves extremities without difficulty  Other:  No tremors noted  Medical Decision Making  Medically screening exam initiated at 11:50 AM.  Appropriate orders placed.  [name] was informed that the remainder of the evaluation will be completed by another provider, this initial triage assessment does not replace that evaluation, and the importance of remaining in the ED until their evaluation is complete.     [name], [place] 10/03/21 1202

## 2021-10-03 NOTE — ED Notes (Signed)
12:48 pm PT decided to leave AMA

## 2021-10-03 NOTE — ED Triage Notes (Signed)
Pt BIB [facility] from home c/o ETOH withdrawal. His last drink was yesterday. Pt wants to be placed in a rehab. Pt states he has COVID.    170/110 50 HR

## 2021-10-03 NOTE — Telephone Encounter (Unsigned)
Patient left last night prior to speaking with him concerning assistance with care coordination.  Patient has had multiple ED visits for similar complaints.  Patient reports needing to find a drug and alcohol rehab center. Resources found for Substance abuse treatment centers in [place]. RNCM contacted patient's brother (emergency contact) Creedon Danielski POA [phone], to discuss assistance with transitional care planning.  Brother is concerned that currently patient does not have a PCP, and he would like him evaluated for seizures. Discussed establishing care with [facility] brother is agreeable and would also like me to forward resources on Substance Abuse Centers in the [place] area via email.  Will also forward information for OP BH as well.

## 2021-10-03 NOTE — ED Notes (Signed)
Pt advised he wanted this EMT to "shut the hell up".

## 2021-10-03 NOTE — ED Notes (Signed)
Pt refused temp 

## 2021-10-03 NOTE — ED Notes (Signed)
Patient was harassing staff and  visitors security removed him out the building with gpd

## 2021-10-03 NOTE — ED Triage Notes (Signed)
Pt being rude and uncooperative with staff.

## 2021-10-08 ENCOUNTER — Other Ambulatory Visit (HOSPITAL_BASED_OUTPATIENT_CLINIC_OR_DEPARTMENT_OTHER): Payer: Self-pay

## 2021-10-24 ENCOUNTER — Other Ambulatory Visit (HOSPITAL_COMMUNITY): Payer: Self-pay

## 2021-11-01 NOTE — Consult Note (Signed)
[facility] Psych ED Discharge  11/01/2021 3:10 PM [name]  MRN:  [id number] Principal Problem: MDD (major depressive disorder), recurrent, severe, with psychosis (HCC) Discharge Diagnoses: Principal Problem:   MDD (major depressive disorder), recurrent, severe, with psychosis (HCC) Active Problems:   Post traumatic stress disorder (PTSD)   Subjective: Much better. I am ready to go.   Aggie Hacker, 51 y.o., male patient admitted to [facility] ED after presenting with complaints of alcohol use disorder and requesting short-term or long-term rehab assistance.  Patient seen via tele psych by this provider, consulted with Dr. Lucianne Muss; and chart reviewed on 11/01/21.  He states he came to the ED as he was experiencing alcohol withdraw complications and needed to seek medical attention. He endorses feeling better in the ED, and requesting assistance to a long term or short term rehabilitation facility. He state he had great experience at [facility] and would like to go back there if possible.    During evaluation [name] is sitting on side of bed in no acute distress.  He is alert, oriented x 4, calm and cooperative.  His mood is euthymic with congruent affect.  He does not appear to be responding to internal/external stimuli or delusional thoughts.  Patient denies suicidal/self-harm/homicidal ideation, psychosis, and paranoia.  Patient answered question appropriately.  Will attempt to begin conversation with [facility] facility.   Total Time spent with patient: 30 minutes  Past Psychiatric History: See above  Past Medical History:  Past Medical History:  Diagnosis Date   Alcohol dependence with withdrawal (HCC)    Anxiety    Asthma    Atrial fibrillation (HCC)    not on AC   Bipolar 1 disorder (HCC)    Class 2 obesity    Cocaine abuse (HCC)    Depression    Heart palpitations    with severe anxiety   Hypertension    PTSD (post-traumatic stress disorder)    PTSD (post-traumatic  stress disorder)     Past Surgical History:  Procedure Laterality Date   NO PAST SURGERIES     Family History:  Family History  Problem Relation Age of Onset   Alcohol abuse Mother    Hypertension Father    Dementia Father    Alcohol abuse Maternal Grandfather    Family Psychiatric  History: None reported Social History:  Social History   Substance and Sexual Activity  Alcohol Use Yes   Comment: 5-7 locos/day     Social History   Substance and Sexual Activity  Drug Use Yes   Types: Cocaine    Social History   Socioeconomic History   Marital status: Single    Spouse name: Not on file   Number of children: Not on file   Years of education: Not on file   Highest education level: Not on file  Occupational History   Occupation: disabled  Tobacco Use   Smoking status: Never   Smokeless tobacco: Never  Vaping Use   Vaping Use: Never used  Substance and Sexual Activity   Alcohol use: Yes    Comment: 5-7 locos/day   Drug use: Yes    Types: Cocaine   Sexual activity: Not Currently    Comment: pt said he has an enlarged prostate  Other Topics Concern   Not on file  Social History Narrative   ** Merged History Encounter **       Social Determinants of Health   Financial Resource Strain: Not on file  Food  Insecurity: Not on file  Transportation Needs: Not on file  Physical Activity: Not on file  Stress: Not on file  Social Connections: Not on file    Has this patient used any form of tobacco in the last 30 days? (Cigarettes, Smokeless Tobacco, Cigars, and/or Pipes) Prescription not provided because: Patient does not use tobacco products  Current Medications: No current facility-administered medications for this encounter.   Current Outpatient Medications  Medication Sig Dispense Refill   albuterol (VENTOLIN HFA) 108 (90 Base) MCG/ACT inhaler Inhale 2 puffs into the lungs every 6 (six) hours as needed for wheezing or shortness of breath.     amLODipine  (NORVASC) 5 MG tablet Take 1 tablet (5 mg total) by mouth daily. 30 tablet 0   chlordiazePOXIDE (LIBRIUM) 25 MG capsule 50mg  PO TID x 1D, then 25mg  PO BID X 1D, then 25mg  PO QD X 1D 9 capsule 0   cloNIDine (CATAPRES) 0.2 MG tablet Take 1 tablet (0.2 mg total) by mouth 3 (three) times daily as needed. 12 tablet 0   diclofenac (VOLTAREN) 50 MG EC tablet Take 50 mg by mouth 3 (three) times daily.     doxepin (SINEQUAN) 10 MG capsule Take 1 capsule (10 mg total) by mouth at bedtime. 30 capsule 0   escitalopram (LEXAPRO) 10 MG tablet Take 10 mg by mouth daily.     hydrochlorothiazide (HYDRODIURIL) 25 MG tablet Take 25 mg by mouth every morning.     hydrOXYzine (ATARAX) 25 MG tablet Take 1 tablet by mouth 3 (three) times daily as needed.     hydrOXYzine (VISTARIL) 25 MG capsule Take 50 mg by mouth 3 (three) times daily.     nirmatrelvir & ritonavir (PAXLOVID, 300/100,) 20 x 150 MG & 10 x 100MG  TBPK Take 3 tablets by mouth twice daily. 30 tablet 0   ondansetron (ZOFRAN-ODT) 4 MG disintegrating tablet Take 1 tablet by mouth every 6 (six) hours as needed.     PTA Medications: (Not in a hospital admission)   Musculoskeletal: Strength & Muscle Tone: within normal limits Gait & Station: normal Patient leans: N/A  Psychiatric Specialty Exam: Physical Exam Vitals and nursing note reviewed. Exam conducted with a chaperone present.  Constitutional:      General: He is not in acute distress.    Appearance: Normal appearance. He is not ill-appearing.  HENT:     Head: Normocephalic.  Cardiovascular:     Rate and Rhythm: Normal rate.  Pulmonary:     Effort: Pulmonary effort is normal.  Musculoskeletal:        General: Normal range of motion.     Cervical back: Normal range of motion.  Neurological:     Mental Status: He is alert and oriented to person, place, and time.  Psychiatric:        Attention and Perception: Attention and perception normal. He does not perceive auditory or visual  hallucinations.        Mood and Affect: Mood and affect normal.        Speech: Speech normal.        Behavior: Behavior normal. Behavior is cooperative.        Thought Content: Thought content normal. Thought content is not paranoid or delusional. Thought content does not include homicidal or suicidal ideation.        Cognition and Memory: Cognition and memory normal.        Judgment: Judgment normal.    Review of Systems  Constitutional: Negative.   HENT:  Negative.    Eyes: Negative.   Respiratory: Negative.    Cardiovascular: Negative.   Gastrointestinal: Negative.   Genitourinary: Negative.   Musculoskeletal: Negative.   Skin: Negative.   Neurological: Negative.   Hematological: Negative.   Psychiatric/Behavioral:  Negative for agitation, behavioral problems, confusion, hallucinations, self-injury and sleep disturbance. Suicidal ideas: Denies.The patient is not nervous/anxious.    Blood pressure 108/82, pulse 77, temperature 97.6 F (36.4 C), temperature source Oral, resp. rate 18, height 5\' 6"  (1.676 m), weight 128.4 kg, SpO2 100 %.Body mass index is 45.68 kg/m.  General Appearance: Casual  Eye Contact:  Good  Speech:  Clear and Coherent and Normal Rate  Volume:  Normal  Mood:  Euthymic  Affect:  Appropriate and Congruent  Thought Process:  Coherent, Goal Directed and Descriptions of Associations: Intact  Orientation:  Full (Time, Place, and Person)  Thought Content:  WDL  Suicidal Thoughts:  No  Homicidal Thoughts:  No  Memory:  Immediate;   Good Recent;   Good  Judgement:  Intact  Insight:  Present  Psychomotor Activity:  Normal  Concentration:  Concentration: Good and Attention Span: Good  Recall:  Good  Fund of Knowledge:  Good  Language:  Good  Akathisia:  No  Handed:  Right  AIMS (if indicated):     Assets:  Communication Skills Desire for Improvement Social Support  ADL's:  Intact  Cognition:  WNL  Sleep:        Demographic Factors:  Male and  Unemployed  Loss Factors: NA  Historical Factors: Impulsivity  Risk Reduction Factors:   Religious beliefs about death and Positive social support  Continued Clinical Symptoms:  Alcohol/Substance Abuse/Dependencies Previous Psychiatric Diagnoses and Treatments  Cognitive Features That Contribute To Risk:  None    Suicide Risk:  Minimal: No identifiable suicidal ideation.  Patients presenting with no risk factors but with morbid ruminations; may be classified as minimal risk based on the severity of the depressive symptoms   Follow-up Information     [facility]-EMERGENCY DEPT.   Specialty: Emergency Medicine Contact information: [address] [id number] mc [place] [address] [phone]        your mental health team.                Plan Of Care/Follow-up recommendations:  Activity:  As tolerated Diet:  Heart healthy    TOC ordered to assist with community services for substance use disorder, and rehab services. [facility] disposition coordinator will assist with referral.   Disposition: Patient psychiatrically cleared No evidence of imminent risk to self or others at present.   Patient does not meet criteria for psychiatric inpatient admission. Supportive therapy provided about ongoing stressors. Refer to IOP. Discussed crisis plan, support from social network, calling 911, coming to the Emergency Department, and calling Suicide Hotline.   Maryagnes Amos, FNP 11/01/2021, 3:10 PM

## 2021-12-21 ENCOUNTER — Encounter (HOSPITAL_COMMUNITY): Payer: Self-pay | Admitting: Emergency Medicine

## 2021-12-21 ENCOUNTER — Other Ambulatory Visit: Payer: Self-pay

## 2021-12-21 ENCOUNTER — Emergency Department (HOSPITAL_COMMUNITY): Payer: Medicare Other

## 2021-12-21 ENCOUNTER — Emergency Department (HOSPITAL_COMMUNITY)
Admission: EM | Admit: 2021-12-21 | Discharge: 2021-12-21 | Disposition: A | Discharge status: Home / Self Care | Payer: Medicare Other | Attending: Emergency Medicine | Admitting: Emergency Medicine

## 2021-12-21 DIAGNOSIS — F10230 Alcohol dependence with withdrawal, uncomplicated: Secondary | ICD-10-CM | POA: Insufficient documentation

## 2021-12-21 DIAGNOSIS — Z79899 Other long term (current) drug therapy: Secondary | ICD-10-CM | POA: Diagnosis not present

## 2021-12-21 DIAGNOSIS — I10 Essential (primary) hypertension: Secondary | ICD-10-CM | POA: Insufficient documentation

## 2021-12-21 DIAGNOSIS — E876 Hypokalemia: Secondary | ICD-10-CM

## 2021-12-21 DIAGNOSIS — F10930 Alcohol use, unspecified with withdrawal, uncomplicated: Secondary | ICD-10-CM

## 2021-12-21 LAB — COMPREHENSIVE METABOLIC PANEL
ALT: 18 U/L (ref 0–44)
AST: 40 U/L (ref 15–41)
Albumin: 3.7 g/dL (ref 3.5–5.0)
Alkaline Phosphatase: 112 U/L (ref 38–126)
Anion gap: 12 (ref 5–15)
BUN: <5 mg/dL — ABNORMAL LOW (ref 6–20)
CO2: 26 mmol/L (ref 22–32)
Calcium: 7.4 mg/dL — ABNORMAL LOW (ref 8.9–10.3)
Chloride: 100 mmol/L (ref 98–111)
Creatinine, Ser: 0.7 mg/dL (ref 0.61–1.24)
GFR, Estimated: >60 mL/min (ref >60)
Glucose, Bld: 109 mg/dL — ABNORMAL HIGH (ref 70–99)
Potassium: 2.9 mmol/L — ABNORMAL LOW (ref 3.5–5.1)
Sodium: 138 mmol/L (ref 135–145)
Total Bilirubin: 0.6 mg/dL (ref 0.3–1.2)
Total Protein: 6.5 g/dL (ref 6.5–8.1)

## 2021-12-21 LAB — URINALYSIS, ROUTINE W REFLEX MICROSCOPIC
Bilirubin Urine: NEGATIVE
Glucose, UA: NEGATIVE mg/dL
Hgb urine dipstick: NEGATIVE
Ketones, ur: NEGATIVE mg/dL
Leukocytes,Ua: NEGATIVE
Nitrite: NEGATIVE
Protein, ur: NEGATIVE mg/dL
Specific Gravity, Urine: 1.011 (ref 1.005–1.030)
pH: 8 (ref 5.0–8.0)

## 2021-12-21 LAB — TROPONIN I (HIGH SENSITIVITY)
Troponin I (High Sensitivity): 8 ng/L (ref <18)
Troponin I (High Sensitivity): 7 ng/L (ref <18)

## 2021-12-21 LAB — RAPID URINE DRUG SCREEN, HOSP PERFORMED
Amphetamines: NOT DETECTED
Barbiturates: NOT DETECTED
Benzodiazepines: NOT DETECTED
Cocaine: NOT DETECTED
Opiates: NOT DETECTED
Tetrahydrocannabinol: NOT DETECTED

## 2021-12-21 LAB — CBC
HCT: 41.4 % (ref 39.0–52.0)
Hemoglobin: 13.3 g/dL (ref 13.0–17.0)
MCH: 26.4 pg (ref 26.0–34.0)
MCHC: 32.1 g/dL (ref 30.0–36.0)
MCV: 82.1 fL (ref 80.0–100.0)
Platelets: 271 10*3/uL (ref 150–400)
RBC: 5.04 MIL/uL (ref 4.22–5.81)
RDW: 14.3 % (ref 11.5–15.5)
WBC: 6.3 10*3/uL (ref 4.0–10.5)
nRBC: 0 % (ref 0.0–0.2)

## 2021-12-21 LAB — ETHANOL: Alcohol, Ethyl (B): 118 mg/dL — ABNORMAL HIGH (ref <10)

## 2021-12-21 LAB — MAGNESIUM: Magnesium: 2 mg/dL (ref 1.7–2.4)

## 2021-12-21 MED ORDER — CALCIUM CARBONATE 1500 (600 CA) MG PO TABS
1500.0000 mg | ORAL_TABLET | Freq: Two times a day (BID) | ORAL | 0 refills | Status: DC
Start: 2021-12-21 — End: 2023-01-05

## 2021-12-21 MED ORDER — CALCIUM GLUCONATE-NACL 1-0.675 GM/50ML-% IV SOLN
1.0000 g | Freq: Once | INTRAVENOUS | Status: AC
  Administered 2021-12-21: 1000 mg via INTRAVENOUS
  Filled 2021-12-21: qty 50

## 2021-12-21 MED ORDER — POTASSIUM CHLORIDE CRYS ER 20 MEQ PO TBCR: 40.0000 meq | EXTENDED_RELEASE_TABLET | Freq: Two times a day (BID) | ORAL | 0 refills | Status: DC

## 2021-12-21 MED ORDER — CHLORDIAZEPOXIDE HCL 25 MG PO CAPS: ORAL_CAPSULE | ORAL | 0 refills | Status: DC

## 2021-12-21 MED ORDER — LORAZEPAM 2 MG/ML IJ SOLN
2.0000 mg | Freq: Once | INTRAMUSCULAR | Status: AC
  Administered 2021-12-21: 2 mg via INTRAVENOUS
  Filled 2021-12-21: qty 1

## 2021-12-21 MED ORDER — CHLORDIAZEPOXIDE HCL 25 MG PO CAPS
50.0000 mg | ORAL_CAPSULE | Freq: Once | ORAL | Status: AC
  Administered 2021-12-21: 50 mg via ORAL
  Filled 2021-12-21: qty 2

## 2021-12-21 MED ORDER — POTASSIUM CHLORIDE CRYS ER 20 MEQ PO TBCR
40.0000 meq | EXTENDED_RELEASE_TABLET | Freq: Once | ORAL | Status: AC
  Administered 2021-12-21: 40 meq via ORAL
  Filled 2021-12-21: qty 2

## 2021-12-21 MED ORDER — LACTATED RINGERS IV BOLUS
1000.0000 mL | Freq: Once | INTRAVENOUS | Status: AC
  Administered 2021-12-21: 1000 mL via INTRAVENOUS

## 2021-12-21 MED ORDER — POTASSIUM CHLORIDE 10 MEQ/100ML IV SOLN
10.0000 meq | Freq: Once | INTRAVENOUS | Status: AC
  Administered 2021-12-21: 10 meq via INTRAVENOUS
  Filled 2021-12-21: qty 100

## 2021-12-21 MED ORDER — LORAZEPAM 1 MG PO TABS: 2.0000 mg | ORAL_TABLET | Freq: Once | ORAL | Status: DC

## 2021-12-21 NOTE — ED Provider Notes (Signed)
?[facility] ?Provider Note ? ? ?CSN: [id number] ?Arrival date & time: 12/21/21  1604 ? ?  ? ?History ? ?Chief Complaint  ?Patient presents with  ? Alcohol Problem  ? ? ?Aggie HackerKeith A Isakson is a 51 y.o. male. ? ?HPI ?51 year old male with a history of alcohol dependence, bipolar disorder, cocaine abuse, PTSD presents with concern for alcohol withdrawal.  He states he has been binging alcohol, starting on 4/19.  Patient is drinking malt liquor, up to 24 cans/day.  Last drink this morning.  He states it does not feel like it gets better when he drinks.  He has been having tremors, some right-sided neck discomfort and right-sided chest discomfort as well as right flank discomfort.  He states he always gets these symptoms as well as tingling in his fingers whenever he goes through alcohol withdrawal.  He has been hallucinating where he states he sees people's faces.  All of these symptoms started yesterday. ? ?Home Medications ?Prior to Admission medications   ?Medication Sig Start Date End Date Taking? Authorizing Provider  ?calcium carbonate (OSCAL) 1500 (600 Ca) MG TABS tablet Take 1 tablet (1,500 mg total) by mouth 2 (two) times daily with a meal for 3 days. 12/21/21 12/24/21 Yes Pricilla LovelessGoldston, Riot Waterworth, MD  ?chlordiazePOXIDE (LIBRIUM) 25 MG capsule 50mg  PO TID x 1D, then 25-50mg  PO BID X 1D, then 25-50mg  PO QD X 1D 12/21/21  Yes Pricilla LovelessGoldston, Maddux First, MD  ?potassium chloride SA (KLOR-CON M) 20 MEQ tablet Take 2 tablets (40 mEq total) by mouth 2 (two) times daily for 5 days. 12/21/21 12/26/21 Yes Pricilla LovelessGoldston, Aizik Reh, MD  ?albuterol (VENTOLIN HFA) 108 (90 Base) MCG/ACT inhaler Inhale 2 puffs into the lungs every 6 (six) hours as needed for wheezing or shortness of breath.    [provider]  ?amLODipine (NORVASC) 5 MG tablet Take 1 tablet (5 mg total) by mouth daily. 09/05/20   Benjiman CorePickering, Nathan, MD  ?cloNIDine (CATAPRES) 0.2 MG tablet Take 1 tablet (0.2 mg total) by mouth 3 (three) times daily as  needed. 09/05/20   Benjiman CorePickering, Nathan, MD  ?diclofenac (VOLTAREN) 50 MG EC tablet Take 50 mg by mouth 3 (three) times daily. 09/29/21   [provider]  ?doxepin (SINEQUAN) 10 MG capsule Take 1 capsule (10 mg total) by mouth at bedtime. 09/05/20   Benjiman CorePickering, Nathan, MD  ?escitalopram (LEXAPRO) 10 MG tablet Take 10 mg by mouth daily.    [provider]  ?hydrochlorothiazide (HYDRODIURIL) 25 MG tablet Take 25 mg by mouth every morning. 09/29/21   [provider]  ?hydrOXYzine (ATARAX) 25 MG tablet Take 1 tablet by mouth 3 (three) times daily as needed.    [provider]  ?hydrOXYzine (VISTARIL) 25 MG capsule Take 50 mg by mouth 3 (three) times daily. 09/23/21   [provider]  ?nirmatrelvir & ritonavir (PAXLOVID, 300/100,) 20 x 150 MG & 10 x 100MG  TBPK Take 3 tablets by mouth twice daily. 10/02/21   Lupita LeashMcQuaid, Douglas B, MD  ?ondansetron (ZOFRAN-ODT) 4 MG disintegrating tablet Take 1 tablet by mouth every 6 (six) hours as needed. 09/18/21   [provider]  ?   ? ?Allergies    ?Metoprolol   ? ?Review of Systems   ?Review of Systems  ?Constitutional:  Negative for fever.  ?Respiratory:  Positive for shortness of breath. Negative for cough.   ?Cardiovascular:  Positive for chest pain.  ?Gastrointestinal:  Negative for vomiting.  ?Musculoskeletal:  Positive for neck pain.  ?Neurological:  Positive  for tremors. Negative for weakness and headaches.  ?Psychiatric/Behavioral:  Positive for hallucinations.   ? ?Physical Exam ?Updated Vital Signs ?BP (!) 145/82   Pulse (!) 103   Temp 97.8 ?F (36.6 ?C) (Oral)   Resp 19   SpO2 99%  ?Physical Exam ?Vitals and nursing note reviewed.  ?Constitutional:   ?   General: He is not in acute distress. ?   Appearance: He is well-developed. He is obese. He is not diaphoretic.  ?HENT:  ?   Head: Normocephalic and atraumatic.  ?Eyes:  ?   Extraocular Movements: Extraocular movements intact.  ?   Pupils: Pupils are equal, round, and reactive to  light.  ?Neck:  ? ?Cardiovascular:  ?   Rate and Rhythm: Normal rate and regular rhythm.  ?   Heart sounds: Normal heart sounds.  ?Pulmonary:  ?   Effort: Pulmonary effort is normal.  ?   Breath sounds: Normal breath sounds.  ?Abdominal:  ?   General: There is no distension.  ?   Palpations: Abdomen is soft.  ?   Tenderness: There is no abdominal tenderness.  ?Musculoskeletal:  ?   Cervical back: Normal range of motion. No rigidity.  ?Skin: ?   General: Skin is warm and dry.  ?Neurological:  ?   Mental Status: He is alert and oriented to person, place, and time.  ?   Motor: Tremor present.  ?   Comments: CN 3-12 grossly intact. 5/5 strength in all 4 extremities. Grossly normal sensation. Normal finger to nose. Significant tremor noted.  ? ? ?ED Results / Procedures / Treatments   ?Labs ?(all labs ordered are listed, but only abnormal results are displayed) ?Labs Reviewed  ?COMPREHENSIVE METABOLIC PANEL - Abnormal; Notable for the following components:  ?    Result Value  ? Potassium 2.9 (*)   ? Glucose, Bld 109 (*)   ? BUN <5 (*)   ? Calcium 7.4 (*)   ? All other components within normal limits  ?ETHANOL - Abnormal; Notable for the following components:  ? Alcohol, Ethyl (B) 118 (*)   ? All other components within normal limits  ?CBC  ?RAPID URINE DRUG SCREEN, HOSP PERFORMED  ?MAGNESIUM  ?URINALYSIS, ROUTINE W REFLEX MICROSCOPIC  ?TROPONIN I (HIGH SENSITIVITY)  ?TROPONIN I (HIGH SENSITIVITY)  ? ? ?EKG ?EKG Interpretation ? ?Date/Time:  Saturday December 21 2021 17:15:39 EDT ?Ventricular Rate:  95 ?PR Interval:  187 ?QRS Duration: 101 ?QT Interval:  363 ?QTC Calculation: 457 ?R Axis:   58 ?Text Interpretation: Sinus tachycardia Multiform ventricular premature complexes Low voltage, precordial leads Anteroseptal infarct, old Confirmed by Pricilla Loveless [phone]) on 12/21/2021 5:53:35 PM ? ?Radiology ?DG Chest Portable 1 View ? ?Result Date: 12/21/2021 ?CLINICAL DATA:  Right-sided chest and neck pain. EXAM: PORTABLE CHEST 1  VIEW COMPARISON:  August 30, 2021 FINDINGS: Abnormal contour of the aorta, stable from multiple prior chest x-rays. Cardiomediastinal silhouette is enlarged. There is no evidence of focal airspace consolidation, pleural effusion or pneumothorax. Osseous structures are without acute abnormality. Soft tissues are grossly normal. IMPRESSION: 1. Enlarged cardiac silhouette. 2. Abnormal contour of the aorta, stable from multiple prior chest x-rays. CT angiogram of the chest may be considered, if the etiology is unknown. Electronically Signed   By: Ted Mcalpine M.D.   On: 12/21/2021 17:35   ? ?Procedures ?Procedures  ? ? ?Medications Ordered in ED ?Medications  ?lactated ringers bolus 1,000 mL (0 mLs Intravenous Stopped 12/21/21 2135)  ?LORazepam (ATIVAN) injection 2 mg (  2 mg Intravenous Given 12/21/21 1717)  ?potassium chloride SA (KLOR-CON M) CR tablet 40 mEq (40 mEq Oral Given 12/21/21 1846)  ?potassium chloride 10 mEq in 100 mL IVPB (0 mEq Intravenous Stopped 12/21/21 1952)  ?calcium gluconate 1 g/ 50 mL sodium chloride IVPB (0 mg Intravenous Stopped 12/21/21 1941)  ?chlordiazePOXIDE (LIBRIUM) capsule 50 mg (50 mg Oral Given 12/21/21 1951)  ? ? ?ED Course/ Medical Decision Making/ A&P ?  ?                        ?Medical Decision Making ?Amount and/or Complexity of Data Reviewed ?Labs: ordered. ?Radiology: ordered. ? ?Risk ?OTC drugs. ?Prescription drug management. ? ? ?Patient is tremulous upon arrival.  He was given IV Ativan and seemed to have improvement in symptoms.  He could be undergoing alcohol withdrawal, especially with his first CIWA score of 13.  This has improved to 6 and he is also feeling concomitantly better.  Hypertension is improving.  He notes some nonspecific right-sided chest pain and neck pain but this is probably muscular based on presentation.  He also states he always gets this during alcohol withdrawal.  Labs have been reviewed/interpreted and he has hypokalemia and hypocalcemia.  QTc is  okay and he does not seem to have any emergent findings related to these so I do not think he necessarily needs admission.  These will be repleted both IV and orally.  His chest x-ray was personally viewed/inte

## 2021-12-21 NOTE — ED Triage Notes (Addendum)
Pt to triage via [facility] from home.  Reports not being able to get meds for 3 months.  Hx of PTSD.  Coping with alcohol for 3 days and states he is detoxing.  Reports L flank pain, HTN, and anxiety.  Last ETOH 8 hours ago.  Denies SI/HI ?

## 2021-12-21 NOTE — Discharge Instructions (Signed)
It is important to follow-up with a primary care physician to get your labs rechecked to recheck your calcium and potassium.  It is also important to get professional help to stop using and abusing alcohol. ?

## 2021-12-24 ENCOUNTER — Other Ambulatory Visit (HOSPITAL_COMMUNITY): Payer: Self-pay

## 2021-12-24 ENCOUNTER — Emergency Department (HOSPITAL_COMMUNITY)
Admission: EM | Admit: 2021-12-24 | Discharge: 2021-12-24 | Disposition: A | Discharge status: Home / Self Care | Payer: Medicare Other | Source: Home / Self Care | Attending: Emergency Medicine | Admitting: Emergency Medicine

## 2021-12-24 ENCOUNTER — Emergency Department (HOSPITAL_COMMUNITY): Payer: Medicare Other

## 2021-12-24 ENCOUNTER — Emergency Department (HOSPITAL_COMMUNITY)
Admission: EM | Admit: 2021-12-24 | Discharge: 2021-12-24 | Disposition: A | Discharge status: Home / Self Care | Payer: Medicare Other | Attending: Emergency Medicine | Admitting: Emergency Medicine

## 2021-12-24 DIAGNOSIS — R002 Palpitations: Secondary | ICD-10-CM | POA: Diagnosis not present

## 2021-12-24 DIAGNOSIS — Y906 Blood alcohol level of 120-199 mg/100 ml: Secondary | ICD-10-CM | POA: Insufficient documentation

## 2021-12-24 DIAGNOSIS — R0602 Shortness of breath: Secondary | ICD-10-CM | POA: Insufficient documentation

## 2021-12-24 DIAGNOSIS — R079 Chest pain, unspecified: Secondary | ICD-10-CM | POA: Insufficient documentation

## 2021-12-24 DIAGNOSIS — J45909 Unspecified asthma, uncomplicated: Secondary | ICD-10-CM | POA: Insufficient documentation

## 2021-12-24 DIAGNOSIS — Y900 Blood alcohol level of less than 20 mg/100 ml: Secondary | ICD-10-CM | POA: Diagnosis not present

## 2021-12-24 DIAGNOSIS — F1010 Alcohol abuse, uncomplicated: Secondary | ICD-10-CM | POA: Insufficient documentation

## 2021-12-24 DIAGNOSIS — I10 Essential (primary) hypertension: Secondary | ICD-10-CM | POA: Insufficient documentation

## 2021-12-24 DIAGNOSIS — Z760 Encounter for issue of repeat prescription: Secondary | ICD-10-CM | POA: Insufficient documentation

## 2021-12-24 DIAGNOSIS — Z79899 Other long term (current) drug therapy: Secondary | ICD-10-CM | POA: Insufficient documentation

## 2021-12-24 DIAGNOSIS — R109 Unspecified abdominal pain: Secondary | ICD-10-CM | POA: Insufficient documentation

## 2021-12-24 DIAGNOSIS — E876 Hypokalemia: Secondary | ICD-10-CM | POA: Insufficient documentation

## 2021-12-24 DIAGNOSIS — F10139 Alcohol abuse with withdrawal, unspecified: Secondary | ICD-10-CM | POA: Diagnosis present

## 2021-12-24 LAB — BASIC METABOLIC PANEL
Anion gap: 8 (ref 5–15)
BUN: 9 mg/dL (ref 6–20)
CO2: 25 mmol/L (ref 22–32)
Calcium: 8.2 mg/dL — ABNORMAL LOW (ref 8.9–10.3)
Chloride: 106 mmol/L (ref 98–111)
Creatinine, Ser: 0.76 mg/dL (ref 0.61–1.24)
GFR, Estimated: >60 mL/min (ref >60)
Glucose, Bld: 104 mg/dL — ABNORMAL HIGH (ref 70–99)
Potassium: 2.8 mmol/L — ABNORMAL LOW (ref 3.5–5.1)
Sodium: 139 mmol/L (ref 135–145)

## 2021-12-24 LAB — CBC
HCT: 38.4 % — ABNORMAL LOW (ref 39.0–52.0)
Hemoglobin: 12.6 g/dL — ABNORMAL LOW (ref 13.0–17.0)
MCH: 27.3 pg (ref 26.0–34.0)
MCHC: 32.8 g/dL (ref 30.0–36.0)
MCV: 83.3 fL (ref 80.0–100.0)
Platelets: 240 10*3/uL (ref 150–400)
RBC: 4.61 MIL/uL (ref 4.22–5.81)
RDW: 14.2 % (ref 11.5–15.5)
WBC: 5.4 10*3/uL (ref 4.0–10.5)
nRBC: 0 % (ref 0.0–0.2)

## 2021-12-24 LAB — TROPONIN I (HIGH SENSITIVITY): Troponin I (High Sensitivity): 5 ng/L (ref <18)

## 2021-12-24 LAB — ETHANOL: Alcohol, Ethyl (B): <10 mg/dL (ref <10)

## 2021-12-24 MED ORDER — CHLORDIAZEPOXIDE HCL 25 MG PO CAPS: 50.0000 mg | ORAL_CAPSULE | Freq: Once | ORAL | Status: DC

## 2021-12-24 MED ORDER — POTASSIUM CHLORIDE 10 MEQ/100ML IV SOLN: 10.0000 meq | Freq: Once | INTRAVENOUS | Status: DC

## 2021-12-24 MED ORDER — CHLORDIAZEPOXIDE HCL 25 MG PO CAPS
ORAL_CAPSULE | ORAL | 0 refills | Status: AC
  Filled 2021-12-24: qty 10, 3d supply, fill #0
  Filled 2021-12-24 (×2): qty 10, 2d supply, fill #0

## 2021-12-24 MED ORDER — POTASSIUM CHLORIDE CRYS ER 20 MEQ PO TBCR: 40.0000 meq | EXTENDED_RELEASE_TABLET | Freq: Once | ORAL | Status: DC

## 2021-12-24 MED ORDER — ONDANSETRON HCL 4 MG/2ML IJ SOLN: 4.0000 mg | Freq: Once | INTRAMUSCULAR | Status: DC

## 2021-12-24 MED ORDER — ONDANSETRON HCL 4 MG PO TABS
4.0000 mg | ORAL_TABLET | ORAL | 0 refills | Status: AC | PRN
  Filled 2021-12-24 (×2): qty 8, 2d supply, fill #0

## 2021-12-24 MED ORDER — SUCRALFATE 1 G PO TABS
1.0000 g | ORAL_TABLET | Freq: Three times a day (TID) | ORAL | 0 refills | Status: DC
  Filled 2021-12-24 (×2): qty 28, 7d supply, fill #0

## 2021-12-24 MED ORDER — MAGNESIUM SULFATE 2 GM/50ML IV SOLN: 2.0000 g | Freq: Once | INTRAVENOUS | Status: DC

## 2021-12-24 MED ORDER — POTASSIUM CHLORIDE CRYS ER 20 MEQ PO TBCR
20.0000 meq | EXTENDED_RELEASE_TABLET | Freq: Two times a day (BID) | ORAL | 0 refills | Status: DC
  Filled 2021-12-24 (×2): qty 6, 3d supply, fill #0

## 2021-12-24 MED ORDER — SODIUM CHLORIDE 0.9 % IV BOLUS: 1000.0000 mL | Freq: Once | INTRAVENOUS | Status: DC

## 2021-12-24 NOTE — Discharge Instructions (Addendum)
It was a pleasure caring for you today in the emergency department. ? ?Please return to the emergency department for any worsening or worrisome symptoms. ? ?Share questions, concerns or observations related to your experience with [facility]. Contact us online or call the Office of Patient Experience at [phone]. ?

## 2021-12-24 NOTE — ED Provider Notes (Signed)
?[facility] EMERGENCY DEPARTMENT ?Provider Note ? ? ?CSN: [id number] ?Arrival date & time: 12/24/21  1055 ? ?  ? ?History ? ?Chief Complaint  ?Patient presents with  ? Chest Pain  ? alcohol detox  ? Palpitations  ? Nausea  ? Shortness of Breath  ? ? ?[name] is a 51 y.o. male. ? ? Patient as above with significant medical history as below, including alcohol dependence, cocaine abuse, PTSD, depression who presents to the ED with complaint of alcohol withdrawal. ? ?He was seen 3 days ago with similar complaint.  He was doing well with his absence of alcohol but did relapse yesterday had 2-4 shots of liquor.  He has been compliant with Librium but still having some tremors, shaking.  Anxiety.  Ran out of Librium yesterday which provoked him to drink.  He is having some indigestion with eating but no nausea or vomiting.  He is not currently having any chest pain.  He does report having chest pain frequently when he withdrawal from alcohol.  He has not been eating very much.  Feels like his symptoms have improved from a few days ago when he was previously evaluated. ? ?He still interested in alcohol withdrawal rehab resources.  He also is requesting a PCP. ? ? ? ?Past Medical History: ?No date: Alcohol dependence with withdrawal (HCC) ?No date: Anxiety ?No date: Asthma ?No date: Atrial fibrillation (HCC) ?    Comment:  not on AC ?No date: Bipolar 1 disorder (HCC) ?No date: Class 2 obesity ?No date: Cocaine abuse (HCC) ?No date: Depression ?No date: Heart palpitations ?    Comment:  with severe anxiety ?No date: Hypertension ?No date: PTSD (post-traumatic stress disorder) ?No date: PTSD (post-traumatic stress disorder) ? ?Past Surgical History: ?No date: NO PAST SURGERIES  ? ? ?The history is provided by the patient. No language interpreter was used.  ?Chest Pain ?Associated symptoms: abdominal pain   ?Associated symptoms: no cough, no dysphagia, no fever, no headache, no nausea, no palpitations,  no shortness of breath and no vomiting   ?Palpitations ?Associated symptoms: chest pain   ?Associated symptoms: no cough, no nausea, no shortness of breath and no vomiting   ?Shortness of Breath ?Associated symptoms: abdominal pain and chest pain   ?Associated symptoms: no cough, no fever, no headaches, no rash and no vomiting   ? ?  ? ?Home Medications ?Prior to Admission medications   ?Medication Sig Start Date End Date Taking? Authorizing Provider  ?chlordiazePOXIDE (LIBRIUM) 25 MG capsule Take 2 capsules (50 mg total) by mouth 3 (three) times daily for 1 day, THEN 1-2 capsules (25-50 mg total) 2 (two) times daily for 1 day, THEN 1-2 capsules (25-50 mg total) daily for 1 day. 12/24/21 12/27/21 Yes Sloan Leiter, DO  ?ondansetron (ZOFRAN) 4 MG tablet Take 1 tablet (4 mg total) by mouth every 4 (four) hours as needed for up to 8 days for nausea or vomiting. 12/24/21 01/01/22 Yes Tanda Rockers A, DO  ?potassium chloride SA (KLOR-CON M) 20 MEQ tablet Take 1 tablet (20 mEq total) by mouth 2 (two) times daily for 3 days. 12/24/21 12/27/21 Yes Sloan Leiter, DO  ?sucralfate (CARAFATE) 1 g tablet Take 1 tablet (1 g total) by mouth 4 (four) times daily before meals and at bedtime for 7 days. 12/24/21 12/31/21 Yes Tanda Rockers A, DO  ?albuterol (VENTOLIN HFA) 108 (90 Base) MCG/ACT inhaler Inhale 2 puffs into the lungs every 6 (six) hours as needed for  wheezing or shortness of breath.    [provider]  ?amLODipine (NORVASC) 5 MG tablet Take 1 tablet (5 mg total) by mouth daily. 09/05/20   Benjiman CorePickering, Nathan, MD  ?calcium carbonate (OSCAL) 1500 (600 Ca) MG TABS tablet Take 1 tablet (1,500 mg total) by mouth 2 (two) times daily with a meal for 3 days. 12/21/21 12/24/21  Pricilla LovelessGoldston, Scott, MD  ?chlordiazePOXIDE (LIBRIUM) 25 MG capsule 50mg  PO TID x 1D, then 25-50mg  PO BID X 1D, then 25-50mg  PO QD X 1D 12/21/21   Pricilla LovelessGoldston, Scott, MD  ?cloNIDine (CATAPRES) 0.2 MG tablet Take 1 tablet (0.2 mg total) by mouth 3 (three) times daily as  needed. 09/05/20   Benjiman CorePickering, Nathan, MD  ?diclofenac (VOLTAREN) 50 MG EC tablet Take 50 mg by mouth 3 (three) times daily. 09/29/21   [provider]  ?doxepin (SINEQUAN) 10 MG capsule Take 1 capsule (10 mg total) by mouth at bedtime. 09/05/20   Benjiman CorePickering, Nathan, MD  ?escitalopram (LEXAPRO) 10 MG tablet Take 10 mg by mouth daily.    [provider]  ?hydrochlorothiazide (HYDRODIURIL) 25 MG tablet Take 25 mg by mouth every morning. 09/29/21   [provider]  ?hydrOXYzine (ATARAX) 25 MG tablet Take 1 tablet by mouth 3 (three) times daily as needed.    [provider]  ?hydrOXYzine (VISTARIL) 25 MG capsule Take 50 mg by mouth 3 (three) times daily. 09/23/21   [provider]  ?nirmatrelvir & ritonavir (PAXLOVID, 300/100,) 20 x 150 MG & 10 x 100MG  TBPK Take 3 tablets by mouth twice daily. 10/02/21   Lupita LeashMcQuaid, Douglas B, MD  ?ondansetron (ZOFRAN-ODT) 4 MG disintegrating tablet Take 1 tablet by mouth every 6 (six) hours as needed. 09/18/21   [provider]  ?potassium chloride SA (KLOR-CON M) 20 MEQ tablet Take 2 tablets (40 mEq total) by mouth 2 (two) times daily for 5 days. 12/21/21 12/26/21  Pricilla LovelessGoldston, Scott, MD  ?   ? ?Allergies    ?Metoprolol   ? ?Review of Systems   ?Review of Systems  ?Constitutional:  Positive for appetite change. Negative for chills and fever.  ?HENT:  Negative for facial swelling and trouble swallowing.   ?Eyes:  Negative for photophobia and visual disturbance.  ?Respiratory:  Negative for cough and shortness of breath.   ?Cardiovascular:  Positive for chest pain. Negative for palpitations.  ?Gastrointestinal:  Positive for abdominal pain. Negative for nausea and vomiting.  ?Endocrine: Negative for polydipsia and polyuria.  ?Genitourinary:  Negative for difficulty urinating and hematuria.  ?Musculoskeletal:  Negative for gait problem and joint swelling.  ?Skin:  Negative for pallor and rash.  ?Neurological:  Negative for syncope and headaches.   ?Psychiatric/Behavioral:  Negative for agitation and confusion.   ? ?Physical Exam ?Updated Vital Signs ?BP (!) 137/119   Pulse 82   Temp 98.7 ?F (37.1 ?C) (Oral)   Resp 17   SpO2 95%  ?Physical Exam ?Vitals and nursing note reviewed.  ?Constitutional:   ?   General: He is not in acute distress. ?   Appearance: He is well-developed.  ?HENT:  ?   Head: Normocephalic and atraumatic.  ?   Right Ear: External ear normal.  ?   Left Ear: External ear normal.  ?   Mouth/Throat:  ?   Mouth: Mucous membranes are moist.  ?Eyes:  ?   General: No scleral icterus. ?Cardiovascular:  ?   Rate and Rhythm: Normal rate and regular rhythm.  ?   Pulses: Normal pulses.  ?  Heart sounds: Normal heart sounds.  ?Pulmonary:  ?   Effort: Pulmonary effort is normal. No respiratory distress.  ?   Breath sounds: Normal breath sounds.  ?Abdominal:  ?   General: Abdomen is flat.  ?   Palpations: Abdomen is soft.  ?   Tenderness: There is no abdominal tenderness.  ?Musculoskeletal:     ?   General: Normal range of motion.  ?   Cervical back: Normal range of motion.  ?   Right lower leg: No edema.  ?   Left lower leg: No edema.  ?Skin: ?   General: Skin is warm and dry.  ?   Capillary Refill: Capillary refill takes less than 2 seconds.  ?   Comments: Linear scars to b/l UE, well healed, multiple   ?Neurological:  ?   Mental Status: He is alert and oriented to person, place, and time.  ?   GCS: GCS eye subscore is 4. GCS verbal subscore is 5. GCS motor subscore is 6.  ?   Cranial Nerves: Cranial nerves 2-12 are intact.  ?   Sensory: Sensation is intact.  ?   Motor: Tremor present.  ?   Coordination: Coordination is intact.  ?   Comments: Mildly tremulous  ?Psychiatric:     ?   Mood and Affect: Mood normal.     ?   Behavior: Behavior normal.  ? ? ?ED Results / Procedures / Treatments   ?Labs ?(all labs ordered are listed, but only abnormal results are displayed) ?Labs Reviewed  ?BASIC METABOLIC PANEL - Abnormal; Notable for the following  components:  ?    Result Value  ? Potassium 2.8 (*)   ? Glucose, Bld 104 (*)   ? Calcium 8.2 (*)   ? All other components within normal limits  ?CBC - Abnormal; Notable for the following components:  ? Hemoglobin 12.6 (*)

## 2021-12-24 NOTE — ED Triage Notes (Signed)
Pt. Stated, last alcohol was yesterday . Lavenia Atlas been taking Librium and Ive been having some shaking. ?

## 2021-12-24 NOTE — ED Triage Notes (Signed)
Pt requesting refill of Librium, last dose Saturday. Last drink approx 6hrs ago after inability to get medication to self-medicate. Takes Librium PRN for alcohol withdrawal. Pt denies SI/HI, NAD, no physical complaints.  ?

## 2021-12-24 NOTE — ED Provider Notes (Signed)
?[facility] ?Provider Note ? ? ?CSN: [id number] ?Arrival date & time: 12/24/21  2308 ? ?  ? ?History ? ?Chief Complaint  ?Patient presents with  ? Medication Refill  ?  Librium  ? ? ?[name] is a 51 y.o. male. ? ?HPI ? ?  ? ?This 51 year old male with history of alcohol abuse and misuse who presents with request of medication refill.  He was seen and evaluated earlier today.  He had a full work-up including labs.  He was replaced with potassium and magnesium.  He was sent out with a Librium taper.  He was previously seen on Sunday and requested a Librium taper for alcohol withdrawal symptoms.  He finished that but felt like "I drink too much and it was not enough."  He relapsed drinking prior to being seen earlier today.  He states that he was unable to get his Librium because "I have to choose between eating and my medications."  He states that he drank approximately 6 hours prior to arrival.  He reports tremulousness and withdrawal symptoms.  Denies SI or HI.  Reports poor social situation. ? ?Home Medications ?Prior to Admission medications   ?Medication Sig Start Date End Date Taking? Authorizing Provider  ?albuterol (VENTOLIN HFA) 108 (90 Base) MCG/ACT inhaler Inhale 2 puffs into the lungs every 6 (six) hours as needed for wheezing or shortness of breath.    [provider]  ?amLODipine (NORVASC) 5 MG tablet Take 1 tablet (5 mg total) by mouth daily. 09/05/20   Benjiman CorePickering, Nathan, MD  ?calcium carbonate (OSCAL) 1500 (600 Ca) MG TABS tablet Take 1 tablet (1,500 mg total) by mouth 2 (two) times daily with a meal for 3 days. 12/21/21 12/24/21  Pricilla LovelessGoldston, Scott, MD  ?chlordiazePOXIDE (LIBRIUM) 25 MG capsule 50mg  PO TID x 1D, then 25-50mg  PO BID X 1D, then 25-50mg  PO QD X 1D 12/21/21   Pricilla LovelessGoldston, Scott, MD  ?chlordiazePOXIDE (LIBRIUM) 25 MG capsule Take 2 capsules (50 mg total) by mouth 3 (three) times daily for 1 day, THEN 1-2 capsules (25-50 mg total) 2 (two) times  daily for 1 day, THEN 1-2 capsules (25-50 mg total) daily for 1 day. 12/24/21 12/27/21  Sloan LeiterGray, Samuel A, DO  ?cloNIDine (CATAPRES) 0.2 MG tablet Take 1 tablet (0.2 mg total) by mouth 3 (three) times daily as needed. 09/05/20   Benjiman CorePickering, Nathan, MD  ?diclofenac (VOLTAREN) 50 MG EC tablet Take 50 mg by mouth 3 (three) times daily. 09/29/21   [provider]  ?doxepin (SINEQUAN) 10 MG capsule Take 1 capsule (10 mg total) by mouth at bedtime. 09/05/20   Benjiman CorePickering, Nathan, MD  ?escitalopram (LEXAPRO) 10 MG tablet Take 10 mg by mouth daily.    [provider]  ?hydrochlorothiazide (HYDRODIURIL) 25 MG tablet Take 25 mg by mouth every morning. 09/29/21   [provider]  ?hydrOXYzine (ATARAX) 25 MG tablet Take 1 tablet by mouth 3 (three) times daily as needed.    [provider]  ?hydrOXYzine (VISTARIL) 25 MG capsule Take 50 mg by mouth 3 (three) times daily. 09/23/21   [provider]  ?nirmatrelvir & ritonavir (PAXLOVID, 300/100,) 20 x 150 MG & 10 x 100MG  TBPK Take 3 tablets by mouth twice daily. 10/02/21   Lupita LeashMcQuaid, Douglas B, MD  ?ondansetron (ZOFRAN) 4 MG tablet Take 1 tablet (4 mg total) by mouth every 4 (four) hours as needed for nausea or vomiting. 12/24/21 01/01/22  Tanda RockersGray, Samuel A, DO  ?ondansetron (ZOFRAN-ODT) 4  MG disintegrating tablet Take 1 tablet by mouth every 6 (six) hours as needed. 09/18/21   [provider]  ?potassium chloride SA (KLOR-CON M) 20 MEQ tablet Take 2 tablets (40 mEq total) by mouth 2 (two) times daily for 5 days. 12/21/21 12/26/21  Pricilla Loveless, MD  ?potassium chloride SA (KLOR-CON M) 20 MEQ tablet Take 1 tablet (20 mEq total) by mouth 2 (two) times daily for 3 days. 12/24/21 12/27/21  Sloan Leiter, DO  ?sucralfate (CARAFATE) 1 g tablet Take 1 tablet (1 g total) by mouth 4 (four) times daily before meals and at bedtime for 7 days. 12/24/21 12/31/21  Sloan Leiter, DO  ?   ? ?Allergies    ?Metoprolol   ? ?Review of Systems   ?Review of Systems   ?Constitutional:  Negative for fever.  ?Respiratory:  Negative for shortness of breath.   ?Cardiovascular:  Negative for chest pain.  ?Gastrointestinal:  Negative for abdominal pain, nausea and vomiting.  ?All other systems reviewed and are negative. ? ?Physical Exam ?Updated Vital Signs ?BP 131/79   Pulse 91   Temp 98.1 ?F (36.7 ?C) (Oral)   Resp 16   SpO2 96%  ?Physical Exam ?Vitals and nursing note reviewed.  ?Constitutional:   ?   Appearance: He is well-developed. He is obese. He is not ill-appearing.  ?HENT:  ?   Head: Normocephalic and atraumatic.  ?   Mouth/Throat:  ?   Mouth: Mucous membranes are moist.  ?Eyes:  ?   Pupils: Pupils are equal, round, and reactive to light.  ?Cardiovascular:  ?   Rate and Rhythm: Normal rate and regular rhythm.  ?   Heart sounds: Normal heart sounds. No murmur heard. ?Pulmonary:  ?   Effort: Pulmonary effort is normal. No respiratory distress.  ?   Breath sounds: Normal breath sounds. No wheezing.  ?Abdominal:  ?   General: Bowel sounds are normal.  ?   Palpations: Abdomen is soft.  ?   Tenderness: There is no abdominal tenderness. There is no rebound.  ?Musculoskeletal:  ?   Cervical back: Neck supple.  ?Lymphadenopathy:  ?   Cervical: No cervical adenopathy.  ?Skin: ?   General: Skin is warm and dry.  ?Neurological:  ?   Mental Status: He is alert and oriented to person, place, and time.  ?Psychiatric:     ?   Mood and Affect: Mood normal.  ? ? ?ED Results / Procedures / Treatments   ?Labs ?(all labs ordered are listed, but only abnormal results are displayed) ?Labs Reviewed  ?ETHANOL - Abnormal; Notable for the following components:  ?    Result Value  ? Alcohol, Ethyl (B) 175 (*)   ? All other components within normal limits  ?I-STAT CHEM 8, ED - Abnormal; Notable for the following components:  ? Potassium 2.9 (*)   ? Glucose, Bld 100 (*)   ? Calcium, Ion 1.03 (*)   ? All other components within normal limits  ? ? ?EKG ?None ? ?Radiology ?DG Chest 2 View ? ?Result  Date: 12/24/2021 ?CLINICAL DATA:  Chest pain. EXAM: CHEST - 2 VIEW COMPARISON:  December 21, 2021. FINDINGS: Similar cardiomediastinal silhouette with tortuous aortic contour. Both lungs are clear. No consolidation. No visible pleural effusions or pneumothorax. No acute displaced fracture. IMPRESSION: 1. No evidence of acute abnormality. 2. Similar tortuous aortic contour. CTA of the chest could further evaluate for aortic aneurysm if clinically warranted. Electronically Signed   By: Feliberto Harts  M.D.   On: 12/24/2021 11:27   ? ?Procedures ?Procedures  ? ? ?Medications Ordered in ED ?Medications  ?chlordiazePOXIDE (LIBRIUM) capsule 50 mg (has no administration in time range)  ? ? ?ED Course/ Medical Decision Making/ A&P ?  ?                        ?Medical Decision Making ?Amount and/or Complexity of Data Reviewed ?Labs: ordered. ? ?Risk ?Prescription drug management. ? ? ?This patient presents to the ED for concern of alcohol withdrawal, request for refill, this involves an extensive number of treatment options, and is a complaint that carries with it a high risk of complications and morbidity.  The differential diagnosis includes withdrawal, intoxication, ? ?MDM:   ? ?This is a 51 year old male well-known to our emergency department who presents requesting Librium and help with withdrawal.  Reports that he drank 6 hours ago.  Has been seen and evaluated twice in the last 3 days for the same.  He states he cannot afford his Librium as an outpatient.  He reports tremors.  He is nontoxic and vital signs are reassuring.  No outward signs of significant withdrawal.  CIWA score is 2.  Suspect he may be continuing to drink more than he is willing to tell us.  We will get a blood alcohol level.  Ordered 1 dose of Librium here.  Prior to evaluation, patient decided to leave AGAINST MEDICAL ADVICE. ? ?Labs reviewed after patient left.  He has persistent hypo kalemia for which he was treated this morning.  Additionally  alcohol level 175. ?(Labs, imaging) ? ?Labs: ?I Ordered, and personally interpreted labs.  The pertinent results include: Alcohol level 175, potassium 2.9 ? ?Imaging Studies ordered: ?I ordered imaging studies including none ?I

## 2021-12-24 NOTE — ED Provider Triage Note (Signed)
Emergency Medicine Provider Triage Evaluation Note ? ?[name] , a 51 y.o. male  was evaluated in triage.  Pt complains of CP x 3 days- intermittent- none at this time.  States he was recently medically admitted for detox, discharged on Librium which she completed.  Did have 4 shots of liquor yesterday.  Concerned he is back into withdrawal today, feeling shaky. ?Brought in by EMS. ?Review of Systems  ?Positive: CP, nausea, SHOB, palpitations  ?Negative: Vomiting, diarrhea ? ?Physical Exam  ?There were no vitals taken for this visit. ?Gen:   Awake, no distress   ?Resp:  Normal effort  ?MSK:   Moves extremities without difficulty  ?Other:  Skin dry ? ?Medical Decision Making  ?Medically screening exam initiated at 11:08 AM.  Appropriate orders placed.  [name] was informed that the remainder of the evaluation will be completed by another provider, this initial triage assessment does not replace that evaluation, and the importance of remaining in the ED until their evaluation is complete. ? ? ?  ?Marcus Fend, PA-C ?12/24/21 1109 ? ?

## 2021-12-24 NOTE — ED Notes (Signed)
Pt states he would like to go home, that he has alcohol at home to drink. Pt advised that if he leaves, it will be AMA. Pt agreeable, states he has a ride to come get him. Pt ambulatory w no assist to lobby. ?

## 2021-12-25 ENCOUNTER — Emergency Department (HOSPITAL_COMMUNITY)
Admission: EM | Admit: 2021-12-25 | Discharge: 2021-12-25 | Disposition: A | Discharge status: Home / Self Care | Payer: Medicare Other | Attending: Emergency Medicine | Admitting: Emergency Medicine

## 2021-12-25 ENCOUNTER — Encounter (HOSPITAL_COMMUNITY): Payer: Self-pay

## 2021-12-25 ENCOUNTER — Emergency Department (HOSPITAL_COMMUNITY): Payer: Medicare Other

## 2021-12-25 ENCOUNTER — Other Ambulatory Visit: Payer: Self-pay

## 2021-12-25 DIAGNOSIS — I10 Essential (primary) hypertension: Secondary | ICD-10-CM | POA: Diagnosis not present

## 2021-12-25 DIAGNOSIS — Y92194 Driveway of other specified residential institution as the place of occurrence of the external cause: Secondary | ICD-10-CM | POA: Diagnosis not present

## 2021-12-25 DIAGNOSIS — Z79899 Other long term (current) drug therapy: Secondary | ICD-10-CM | POA: Diagnosis not present

## 2021-12-25 DIAGNOSIS — F10221 Alcohol dependence with intoxication delirium: Secondary | ICD-10-CM | POA: Insufficient documentation

## 2021-12-25 DIAGNOSIS — F10121 Alcohol abuse with intoxication delirium: Secondary | ICD-10-CM

## 2021-12-25 DIAGNOSIS — Z20822 Contact with and (suspected) exposure to covid-19: Secondary | ICD-10-CM | POA: Insufficient documentation

## 2021-12-25 DIAGNOSIS — Y906 Blood alcohol level of 120-199 mg/100 ml: Secondary | ICD-10-CM | POA: Diagnosis not present

## 2021-12-25 DIAGNOSIS — X58XXXA Exposure to other specified factors, initial encounter: Secondary | ICD-10-CM | POA: Insufficient documentation

## 2021-12-25 DIAGNOSIS — S0081XA Abrasion of other part of head, initial encounter: Secondary | ICD-10-CM | POA: Diagnosis not present

## 2021-12-25 DIAGNOSIS — S0990XA Unspecified injury of head, initial encounter: Secondary | ICD-10-CM | POA: Diagnosis present

## 2021-12-25 LAB — CBC WITH DIFFERENTIAL/PLATELET
Abs Immature Granulocytes: 0.05 10*3/uL (ref 0.00–0.07)
Basophils Absolute: 0 10*3/uL (ref 0.0–0.1)
Basophils Relative: 0 %
Eosinophils Absolute: 0.2 10*3/uL (ref 0.0–0.5)
Eosinophils Relative: 4 %
HCT: 38 % — ABNORMAL LOW (ref 39.0–52.0)
Hemoglobin: 12.4 g/dL — ABNORMAL LOW (ref 13.0–17.0)
Immature Granulocytes: 1 %
Lymphocytes Relative: 19 %
Lymphs Abs: 1 10*3/uL (ref 0.7–4.0)
MCH: 27.6 pg (ref 26.0–34.0)
MCHC: 32.6 g/dL (ref 30.0–36.0)
MCV: 84.6 fL (ref 80.0–100.0)
Monocytes Absolute: 0.5 10*3/uL (ref 0.1–1.0)
Monocytes Relative: 9 %
Neutro Abs: 3.7 10*3/uL (ref 1.7–7.7)
Neutrophils Relative %: 67 %
Platelets: 232 10*3/uL (ref 150–400)
RBC: 4.49 MIL/uL (ref 4.22–5.81)
RDW: 14.6 % (ref 11.5–15.5)
WBC: 5.5 10*3/uL (ref 4.0–10.5)
nRBC: 0 % (ref 0.0–0.2)

## 2021-12-25 LAB — ETHANOL
Alcohol, Ethyl (B): 175 mg/dL — ABNORMAL HIGH (ref <10)
Alcohol, Ethyl (B): 180 mg/dL — ABNORMAL HIGH (ref <10)

## 2021-12-25 LAB — I-STAT CHEM 8, ED
BUN: 10 mg/dL (ref 6–20)
Calcium, Ion: 1.03 mmol/L — ABNORMAL LOW (ref 1.15–1.40)
Chloride: 103 mmol/L (ref 98–111)
Creatinine, Ser: 1.2 mg/dL (ref 0.61–1.24)
Glucose, Bld: 100 mg/dL — ABNORMAL HIGH (ref 70–99)
HCT: 41 % (ref 39.0–52.0)
Hemoglobin: 13.9 g/dL (ref 13.0–17.0)
Potassium: 2.9 mmol/L — ABNORMAL LOW (ref 3.5–5.1)
Sodium: 143 mmol/L (ref 135–145)
TCO2: 27 mmol/L (ref 22–32)

## 2021-12-25 LAB — SALICYLATE LEVEL: Salicylate Lvl: <7 mg/dL — ABNORMAL LOW (ref 7.0–30.0)

## 2021-12-25 LAB — RESP PANEL BY RT-PCR (FLU A&B, COVID) ARPGX2
Influenza A by PCR: NEGATIVE
Influenza B by PCR: NEGATIVE
SARS Coronavirus 2 by RT PCR: NEGATIVE

## 2021-12-25 LAB — COMPREHENSIVE METABOLIC PANEL
ALT: 17 U/L (ref 0–44)
AST: 29 U/L (ref 15–41)
Albumin: 3.2 g/dL — ABNORMAL LOW (ref 3.5–5.0)
Alkaline Phosphatase: 117 U/L (ref 38–126)
Anion gap: 9 (ref 5–15)
BUN: 12 mg/dL (ref 6–20)
CO2: 24 mmol/L (ref 22–32)
Calcium: 8 mg/dL — ABNORMAL LOW (ref 8.9–10.3)
Chloride: 109 mmol/L (ref 98–111)
Creatinine, Ser: 0.79 mg/dL (ref 0.61–1.24)
GFR, Estimated: >60 mL/min (ref >60)
Glucose, Bld: 104 mg/dL — ABNORMAL HIGH (ref 70–99)
Potassium: 3 mmol/L — ABNORMAL LOW (ref 3.5–5.1)
Sodium: 142 mmol/L (ref 135–145)
Total Bilirubin: 0.6 mg/dL (ref 0.3–1.2)
Total Protein: 6.1 g/dL — ABNORMAL LOW (ref 6.5–8.1)

## 2021-12-25 LAB — ACETAMINOPHEN LEVEL: Acetaminophen (Tylenol), Serum: <10 ug/mL — ABNORMAL LOW (ref 10–30)

## 2021-12-25 LAB — CBG MONITORING, ED: Glucose-Capillary: 88 mg/dL (ref 70–99)

## 2021-12-25 LAB — MAGNESIUM: Magnesium: 2.1 mg/dL (ref 1.7–2.4)

## 2021-12-25 LAB — AMMONIA: Ammonia: 15 umol/L (ref 9–35)

## 2021-12-25 MED ORDER — LORAZEPAM 2 MG/ML IJ SOLN: 0.0000 mg | Freq: Four times a day (QID) | INTRAMUSCULAR | Status: DC

## 2021-12-25 MED ORDER — LORAZEPAM 2 MG/ML IJ SOLN: 0.0000 mg | Freq: Two times a day (BID) | INTRAMUSCULAR | Status: DC

## 2021-12-25 MED ORDER — LORAZEPAM 1 MG PO TABS: 0.0000 mg | ORAL_TABLET | Freq: Four times a day (QID) | ORAL | Status: DC

## 2021-12-25 MED ORDER — THIAMINE HCL 100 MG/ML IJ SOLN: 100.0000 mg | Freq: Every day | INTRAMUSCULAR | Status: DC

## 2021-12-25 MED ORDER — LORAZEPAM 1 MG PO TABS: 0.0000 mg | ORAL_TABLET | Freq: Two times a day (BID) | ORAL | Status: DC

## 2021-12-25 MED ORDER — THIAMINE HCL 100 MG/ML IJ SOLN
100.0000 mg | Freq: Once | INTRAMUSCULAR | Status: AC
  Administered 2021-12-25: 100 mg via INTRAVENOUS
  Filled 2021-12-25: qty 2

## 2021-12-25 NOTE — ED Notes (Signed)
Patient transported to CT 

## 2021-12-25 NOTE — ED Provider Notes (Signed)
?[facility]-EMERGENCY DEPT ?Provider Note ? ? ?CSN: [id number] ?Arrival date & time: 12/25/21  1151 ? ?  ? ?History ? ?No chief complaint on file. ? ? ?[name] is a 51 y.o. male with a past medical history of polysubstance abuse, bipolar 1 disorder, history of self-mutilation, alcohol misuse disorder with severe dependence, obesity and hypertension who presents to the emergency department via EMS.  Patient is currently unable to answer questions.  According to the nursing notes the patient was found rolling around in his driveway and had trashed his entire house.  He required Haldol, Versed and restraints prior to arrival given by EMS.  Patient was seen yesterday in the emergency department requesting a refill on a Librium taper that he was given for withdrawal symptoms.  This was not given to him. ? ?HPI ? ?  ? ?Home Medications ?Prior to Admission medications   ?Medication Sig Start Date End Date Taking? Authorizing Provider  ?albuterol (VENTOLIN HFA) 108 (90 Base) MCG/ACT inhaler Inhale 2 puffs into the lungs every 6 (six) hours as needed for wheezing or shortness of breath.    [provider]  ?amLODipine (NORVASC) 5 MG tablet Take 1 tablet (5 mg total) by mouth daily. 09/05/20   Benjiman CorePickering, Nathan, MD  ?calcium carbonate (OSCAL) 1500 (600 Ca) MG TABS tablet Take 1 tablet (1,500 mg total) by mouth 2 (two) times daily with a meal for 3 days. 12/21/21 12/24/21  Pricilla LovelessGoldston, Scott, MD  ?chlordiazePOXIDE (LIBRIUM) 25 MG capsule 50mg  PO TID x 1D, then 25-50mg  PO BID X 1D, then 25-50mg  PO QD X 1D 12/21/21   Pricilla LovelessGoldston, Scott, MD  ?chlordiazePOXIDE (LIBRIUM) 25 MG capsule Take 2 capsules (50 mg total) by mouth 3 (three) times daily for 1 day, THEN 1-2 capsules (25-50 mg total) 2 (two) times daily for 1 day, THEN 1-2 capsules (25-50 mg total) daily for 1 day. 12/24/21 12/27/21  Sloan LeiterGray, Samuel A, DO  ?cloNIDine (CATAPRES) 0.2 MG tablet Take 1 tablet (0.2 mg total) by mouth 3 (three) times daily as  needed. 09/05/20   Benjiman CorePickering, Nathan, MD  ?diclofenac (VOLTAREN) 50 MG EC tablet Take 50 mg by mouth 3 (three) times daily. 09/29/21   [provider]  ?doxepin (SINEQUAN) 10 MG capsule Take 1 capsule (10 mg total) by mouth at bedtime. 09/05/20   Benjiman CorePickering, Nathan, MD  ?escitalopram (LEXAPRO) 10 MG tablet Take 10 mg by mouth daily.    [provider]  ?hydrochlorothiazide (HYDRODIURIL) 25 MG tablet Take 25 mg by mouth every morning. 09/29/21   [provider]  ?hydrOXYzine (ATARAX) 25 MG tablet Take 1 tablet by mouth 3 (three) times daily as needed.    [provider]  ?hydrOXYzine (VISTARIL) 25 MG capsule Take 50 mg by mouth 3 (three) times daily. 09/23/21   [provider]  ?nirmatrelvir & ritonavir (PAXLOVID, 300/100,) 20 x 150 MG & 10 x 100MG  TBPK Take 3 tablets by mouth twice daily. 10/02/21   Lupita LeashMcQuaid, Douglas B, MD  ?ondansetron (ZOFRAN) 4 MG tablet Take 1 tablet (4 mg total) by mouth every 4 (four) hours as needed for nausea or vomiting. 12/24/21 01/01/22  Tanda RockersGray, Samuel A, DO  ?ondansetron (ZOFRAN-ODT) 4 MG disintegrating tablet Take 1 tablet by mouth every 6 (six) hours as needed. 09/18/21   [provider]  ?potassium chloride SA (KLOR-CON M) 20 MEQ tablet Take 2 tablets (40 mEq total) by mouth 2 (two) times daily for 5 days. 12/21/21 12/26/21  Pricilla LovelessGoldston, Scott, MD  ?  potassium chloride SA (KLOR-CON M) 20 MEQ tablet Take 1 tablet (20 mEq total) by mouth 2 (two) times daily for 3 days. 12/24/21 12/27/21  Sloan Leiter, DO  ?sucralfate (CARAFATE) 1 g tablet Take 1 tablet (1 g total) by mouth 4 (four) times daily before meals and at bedtime for 7 days. 12/24/21 12/31/21  Sloan Leiter, DO  ?   ? ?Allergies    ?Metoprolol   ? ?Review of Systems   ?Review of Systems ? ?Physical Exam ?Updated Vital Signs ?BP 138/75   Pulse 88   Temp 97.7 ?F (36.5 ?C) (Oral)   Resp 19   Ht 5\' 7"  (1.702 m)   Wt 130.6 kg   SpO2 97%   BMI 45.09 kg/m?  ?Physical Exam ?Vitals and nursing note  reviewed.  ?Constitutional:   ?   General: He is sleeping. He is not in acute distress. ?   Appearance: Normal appearance. He is well-developed. He is not diaphoretic.  ?HENT:  ?   Head: Normocephalic.  ?   Comments: Abrasion hematoma to the right frontal area of the head ?Eyes:  ?   General: No scleral icterus. ?   Conjunctiva/sclera: Conjunctivae normal.  ?Cardiovascular:  ?   Rate and Rhythm: Normal rate and regular rhythm.  ?   Heart sounds: Normal heart sounds.  ?Pulmonary:  ?   Effort: Pulmonary effort is normal. No respiratory distress.  ?   Breath sounds: Normal breath sounds.  ?Abdominal:  ?   Palpations: Abdomen is soft.  ?   Tenderness: There is no abdominal tenderness.  ?Musculoskeletal:  ?   Cervical back: Normal range of motion and neck supple.  ?Skin: ?   General: Skin is warm and dry.  ?   Comments: Innumerable well-healed self-inflicted lacerations scars of bilateral arms  ?Psychiatric:     ?   Behavior: Behavior normal.  ? ? ?ED Results / Procedures / Treatments   ?Labs ?(all labs ordered are listed, but only abnormal results are displayed) ?Labs Reviewed  ?CBC WITH DIFFERENTIAL/PLATELET - Abnormal; Notable for the following components:  ?    Result Value  ? Hemoglobin 12.4 (*)   ? HCT 38.0 (*)   ? All other components within normal limits  ?COMPREHENSIVE METABOLIC PANEL - Abnormal; Notable for the following components:  ? Potassium 3.0 (*)   ? Glucose, Bld 104 (*)   ? Calcium 8.0 (*)   ? Total Protein 6.1 (*)   ? Albumin 3.2 (*)   ? All other components within normal limits  ?ETHANOL - Abnormal; Notable for the following components:  ? Alcohol, Ethyl (B) 180 (*)   ? All other components within normal limits  ?ACETAMINOPHEN LEVEL - Abnormal; Notable for the following components:  ? Acetaminophen (Tylenol), Serum <10 (*)   ? All other components within normal limits  ?SALICYLATE LEVEL - Abnormal; Notable for the following components:  ? Salicylate Lvl <7.0 (*)   ? All other components within normal  limits  ?RESP PANEL BY RT-PCR (FLU A&B, COVID) ARPGX2  ?AMMONIA  ?MAGNESIUM  ?URINALYSIS, ROUTINE W REFLEX MICROSCOPIC  ?RAPID URINE DRUG SCREEN, HOSP PERFORMED  ?VITAMIN B1  ?CBG MONITORING, ED  ? ? ?EKG ?None ? ?Radiology ?DG Chest 2 View ? ?Result Date: 12/24/2021 ?CLINICAL DATA:  Chest pain. EXAM: CHEST - 2 VIEW COMPARISON:  December 21, 2021. FINDINGS: Similar cardiomediastinal silhouette with tortuous aortic contour. Both lungs are clear. No consolidation. No visible pleural effusions or pneumothorax. No acute displaced  fracture. IMPRESSION: 1. No evidence of acute abnormality. 2. Similar tortuous aortic contour. CTA of the chest could further evaluate for aortic aneurysm if clinically warranted. Electronically Signed   By: Feliberto Harts M.D.   On: 12/24/2021 11:27  ? ?CT Head Wo Contrast ? ?Result Date: 12/25/2021 ?CLINICAL DATA:  Altered mental status EXAM: CT HEAD WITHOUT CONTRAST TECHNIQUE: Contiguous axial images were obtained from the base of the skull through the vertex without intravenous contrast. RADIATION DOSE REDUCTION: This exam was performed according to the departmental dose-optimization program which includes automated exposure control, adjustment of the mA and/or kV according to patient size and/or use of iterative reconstruction technique. COMPARISON:  Previous studies including the examination done on 09/26/2021 FINDINGS: Brain: No acute intracranial findings are seen. Ventricles are not dilated. There is no shift of midline structures. Cortical sulci are prominent with no significant interval change. Minimal calcifications are seen in the basal ganglia. Vascular: Unremarkable. Skull: Unremarkable. Sinuses/Orbits: There is mucosal thickening in the ethmoid, maxillary and sphenoid sinuses. There are no air-fluid levels. Other: None IMPRESSION: No acute intracranial findings are seen in the noncontrast CT brain. Mild chronic sinusitis. Electronically Signed   By: Ernie Avena M.D.   On:  12/25/2021 12:53   ? ?Procedures ?Procedures  ? ? ?Medications Ordered in ED ?Medications  ?LORazepam (ATIVAN) injection 0-4 mg (0 mg Intravenous Hold 12/25/21 1251)  ?  Or  ?LORazepam (ATIVAN) tablet 0-4 mg ( Oral

## 2021-12-25 NOTE — Discharge Instructions (Signed)
Get help right away if: ?You have any of the following: ?Moderate or very bad trouble with: ?Movement (coordination). ?Talking. ?Memory. ?Paying attention to things. ?Trouble staying awake. ?Being very confused. ?Jerky movements that you cannot control (seizure). ?Light-headedness. ?Fainting. ?Throwing up (vomiting) blood. The blood may be bright red or look like coffee grounds. ?Blood in your poop (stool). The blood may: ?Be bright red. ?Make your poop black and tarry and make it smell bad. ?Feeling shaky when you try to stop drinking. ?Thoughts about hurting yourself or others. ?

## 2021-12-25 NOTE — ED Triage Notes (Signed)
PT BIB [facility] from home. Patient was found in his driveway rolling around. Neighbors state he trashed his home prior to coming outside. Patient was seen at [facility] this week for prescription refill for alcohol withdrawal. Patient states prescription was not refilled. Patent does state he did drink eariler today. EMS gave 5 of haldol and 5 of versed. Patient was placed in restraints with EMS until he became cooperative. ? ?Vitals were:  ?128/75 ?96% 3L ?109-CBG ? ?

## 2021-12-27 LAB — VITAMIN B1: Vitamin B1 (Thiamine): 84.2 nmol/L (ref 66.5–200.0)

## 2021-12-28 ENCOUNTER — Encounter (HOSPITAL_COMMUNITY): Payer: Self-pay | Admitting: Emergency Medicine

## 2021-12-28 ENCOUNTER — Emergency Department (HOSPITAL_COMMUNITY)
Admission: EM | Admit: 2021-12-28 | Discharge: 2021-12-28 | Discharge status: Against Medical Advice | Payer: Medicare Other | Attending: Emergency Medicine | Admitting: Emergency Medicine

## 2021-12-28 ENCOUNTER — Emergency Department (HOSPITAL_COMMUNITY)
Admission: EM | Admit: 2021-12-28 | Discharge: 2021-12-28 | Discharge status: Against Medical Advice | Payer: Medicare Other | Source: Home / Self Care | Attending: Emergency Medicine | Admitting: Emergency Medicine

## 2021-12-28 ENCOUNTER — Other Ambulatory Visit: Payer: Self-pay

## 2021-12-28 DIAGNOSIS — F419 Anxiety disorder, unspecified: Secondary | ICD-10-CM | POA: Insufficient documentation

## 2021-12-28 DIAGNOSIS — R451 Restlessness and agitation: Secondary | ICD-10-CM | POA: Insufficient documentation

## 2021-12-28 DIAGNOSIS — S0083XD Contusion of other part of head, subsequent encounter: Secondary | ICD-10-CM | POA: Insufficient documentation

## 2021-12-28 DIAGNOSIS — Z5321 Procedure and treatment not carried out due to patient leaving prior to being seen by health care provider: Secondary | ICD-10-CM | POA: Diagnosis not present

## 2021-12-28 DIAGNOSIS — Y905 Blood alcohol level of 100-119 mg/100 ml: Secondary | ICD-10-CM | POA: Insufficient documentation

## 2021-12-28 DIAGNOSIS — S0083XA Contusion of other part of head, initial encounter: Secondary | ICD-10-CM | POA: Insufficient documentation

## 2021-12-28 DIAGNOSIS — Z79899 Other long term (current) drug therapy: Secondary | ICD-10-CM | POA: Insufficient documentation

## 2021-12-28 DIAGNOSIS — F1010 Alcohol abuse, uncomplicated: Secondary | ICD-10-CM

## 2021-12-28 DIAGNOSIS — W101XXA Fall (on)(from) sidewalk curb, initial encounter: Secondary | ICD-10-CM | POA: Insufficient documentation

## 2021-12-28 DIAGNOSIS — W101XXD Fall (on)(from) sidewalk curb, subsequent encounter: Secondary | ICD-10-CM | POA: Insufficient documentation

## 2021-12-28 DIAGNOSIS — X58XXXA Exposure to other specified factors, initial encounter: Secondary | ICD-10-CM | POA: Insufficient documentation

## 2021-12-28 DIAGNOSIS — F10120 Alcohol abuse with intoxication, uncomplicated: Secondary | ICD-10-CM | POA: Insufficient documentation

## 2021-12-28 DIAGNOSIS — S0990XA Unspecified injury of head, initial encounter: Secondary | ICD-10-CM | POA: Diagnosis present

## 2021-12-28 LAB — COMPREHENSIVE METABOLIC PANEL
ALT: 16 U/L (ref 0–44)
AST: 24 U/L (ref 15–41)
Albumin: 3.3 g/dL — ABNORMAL LOW (ref 3.5–5.0)
Alkaline Phosphatase: 100 U/L (ref 38–126)
Anion gap: 11 (ref 5–15)
BUN: 8 mg/dL (ref 6–20)
CO2: 24 mmol/L (ref 22–32)
Calcium: 7.8 mg/dL — ABNORMAL LOW (ref 8.9–10.3)
Chloride: 105 mmol/L (ref 98–111)
Creatinine, Ser: 0.89 mg/dL (ref 0.61–1.24)
GFR, Estimated: >60 mL/min (ref >60)
Glucose, Bld: 100 mg/dL — ABNORMAL HIGH (ref 70–99)
Potassium: 2.8 mmol/L — ABNORMAL LOW (ref 3.5–5.1)
Sodium: 140 mmol/L (ref 135–145)
Total Bilirubin: 0.6 mg/dL (ref 0.3–1.2)
Total Protein: 6.1 g/dL — ABNORMAL LOW (ref 6.5–8.1)

## 2021-12-28 LAB — CBC WITH DIFFERENTIAL/PLATELET
Abs Immature Granulocytes: 0.03 10*3/uL (ref 0.00–0.07)
Basophils Absolute: 0 10*3/uL (ref 0.0–0.1)
Basophils Relative: 0 %
Eosinophils Absolute: 0.2 10*3/uL (ref 0.0–0.5)
Eosinophils Relative: 3 %
HCT: 40.3 % (ref 39.0–52.0)
Hemoglobin: 12.9 g/dL — ABNORMAL LOW (ref 13.0–17.0)
Immature Granulocytes: 1 %
Lymphocytes Relative: 29 %
Lymphs Abs: 1.6 10*3/uL (ref 0.7–4.0)
MCH: 27.2 pg (ref 26.0–34.0)
MCHC: 32 g/dL (ref 30.0–36.0)
MCV: 85 fL (ref 80.0–100.0)
Monocytes Absolute: 0.5 10*3/uL (ref 0.1–1.0)
Monocytes Relative: 9 %
Neutro Abs: 3.2 10*3/uL (ref 1.7–7.7)
Neutrophils Relative %: 58 %
Platelets: 228 10*3/uL (ref 150–400)
RBC: 4.74 MIL/uL (ref 4.22–5.81)
RDW: 15.2 % (ref 11.5–15.5)
WBC: 5.5 10*3/uL (ref 4.0–10.5)
nRBC: 0 % (ref 0.0–0.2)

## 2021-12-28 LAB — ETHANOL: Alcohol, Ethyl (B): 118 mg/dL — ABNORMAL HIGH (ref <10)

## 2021-12-28 IMAGING — DX DG CHEST 2V
2 series · 2 of 2 positions shown · non-contrast
Comparison: 04/21/2020

CLINICAL DATA: Chest pain

EXAM:
CHEST - 2 VIEW

[chest lat]
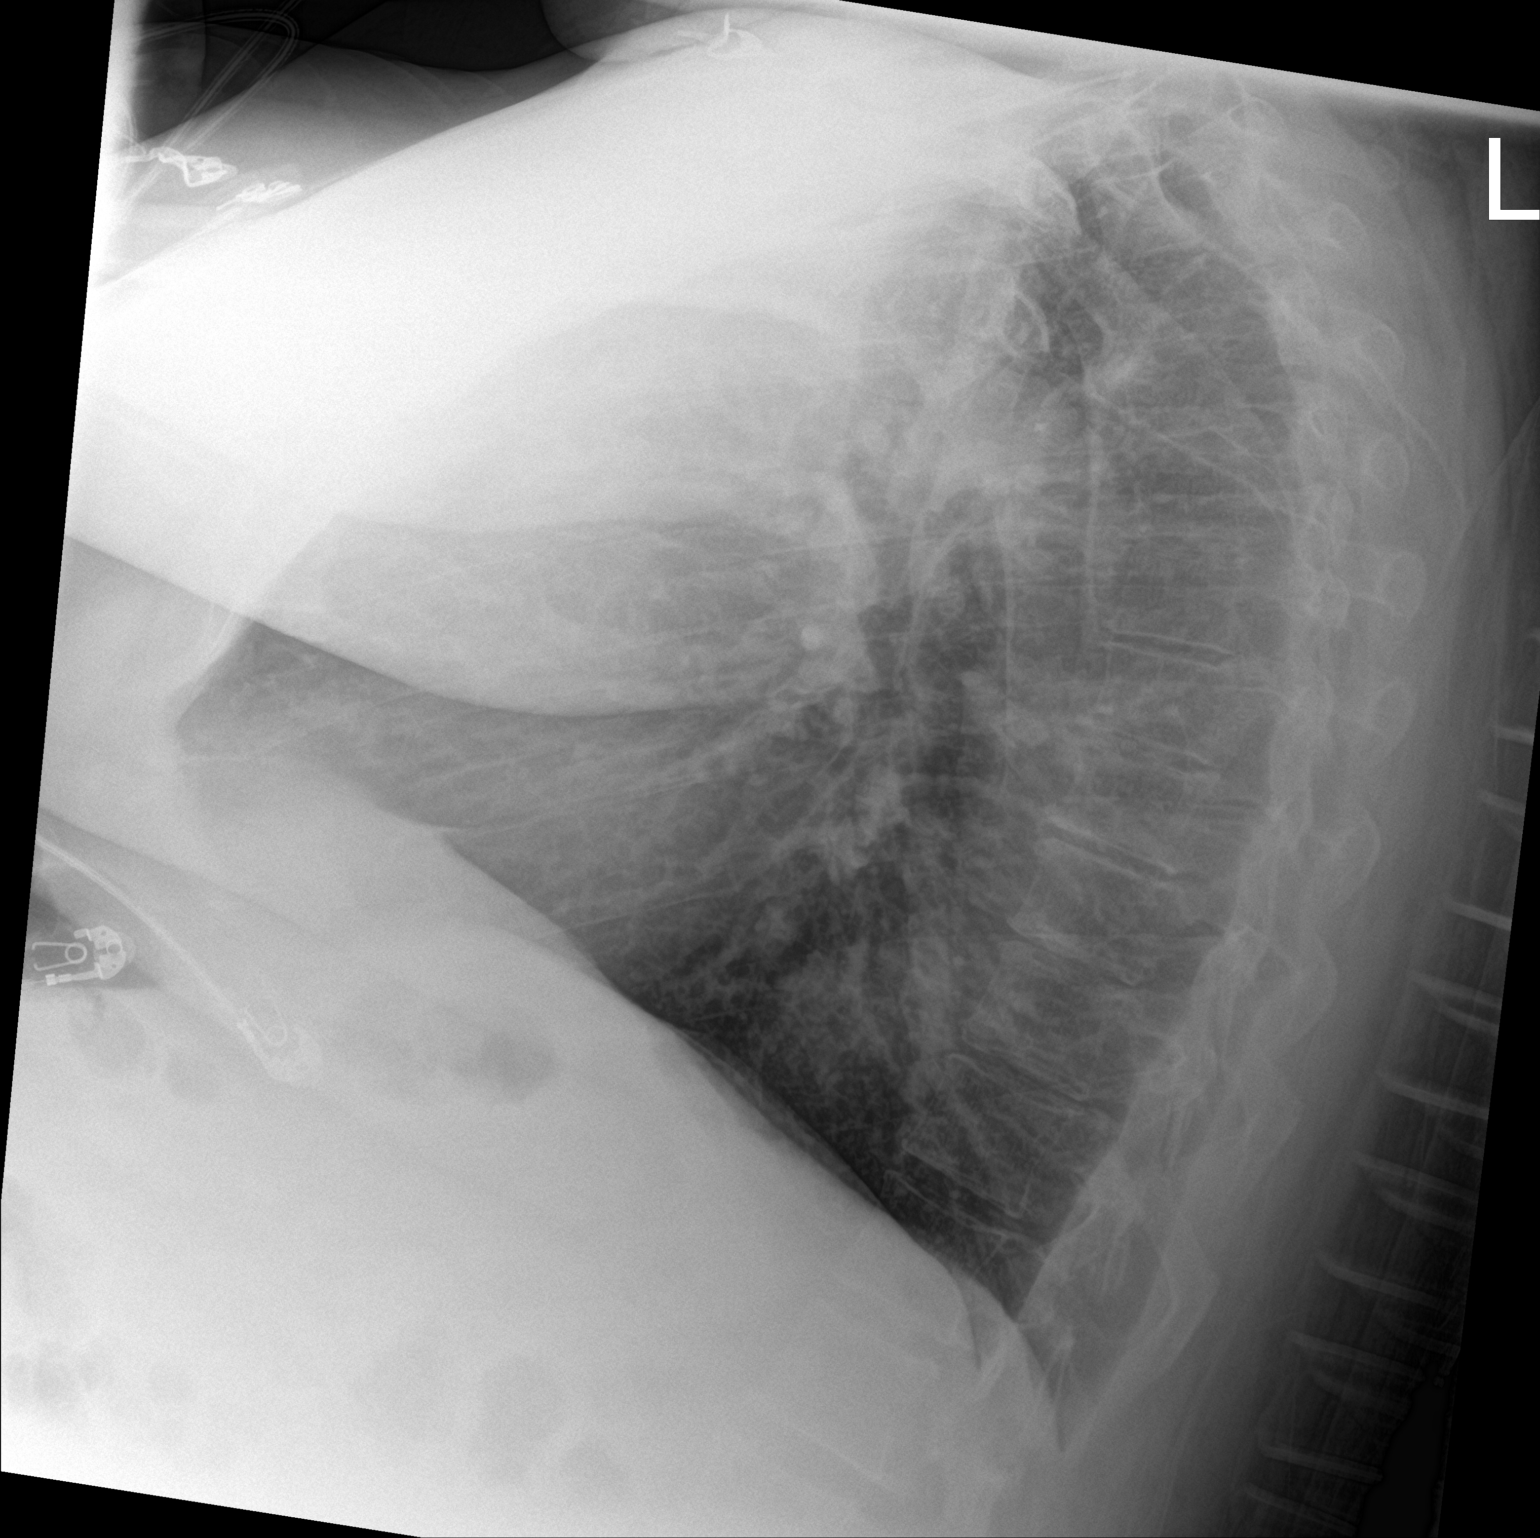

[chest ap]
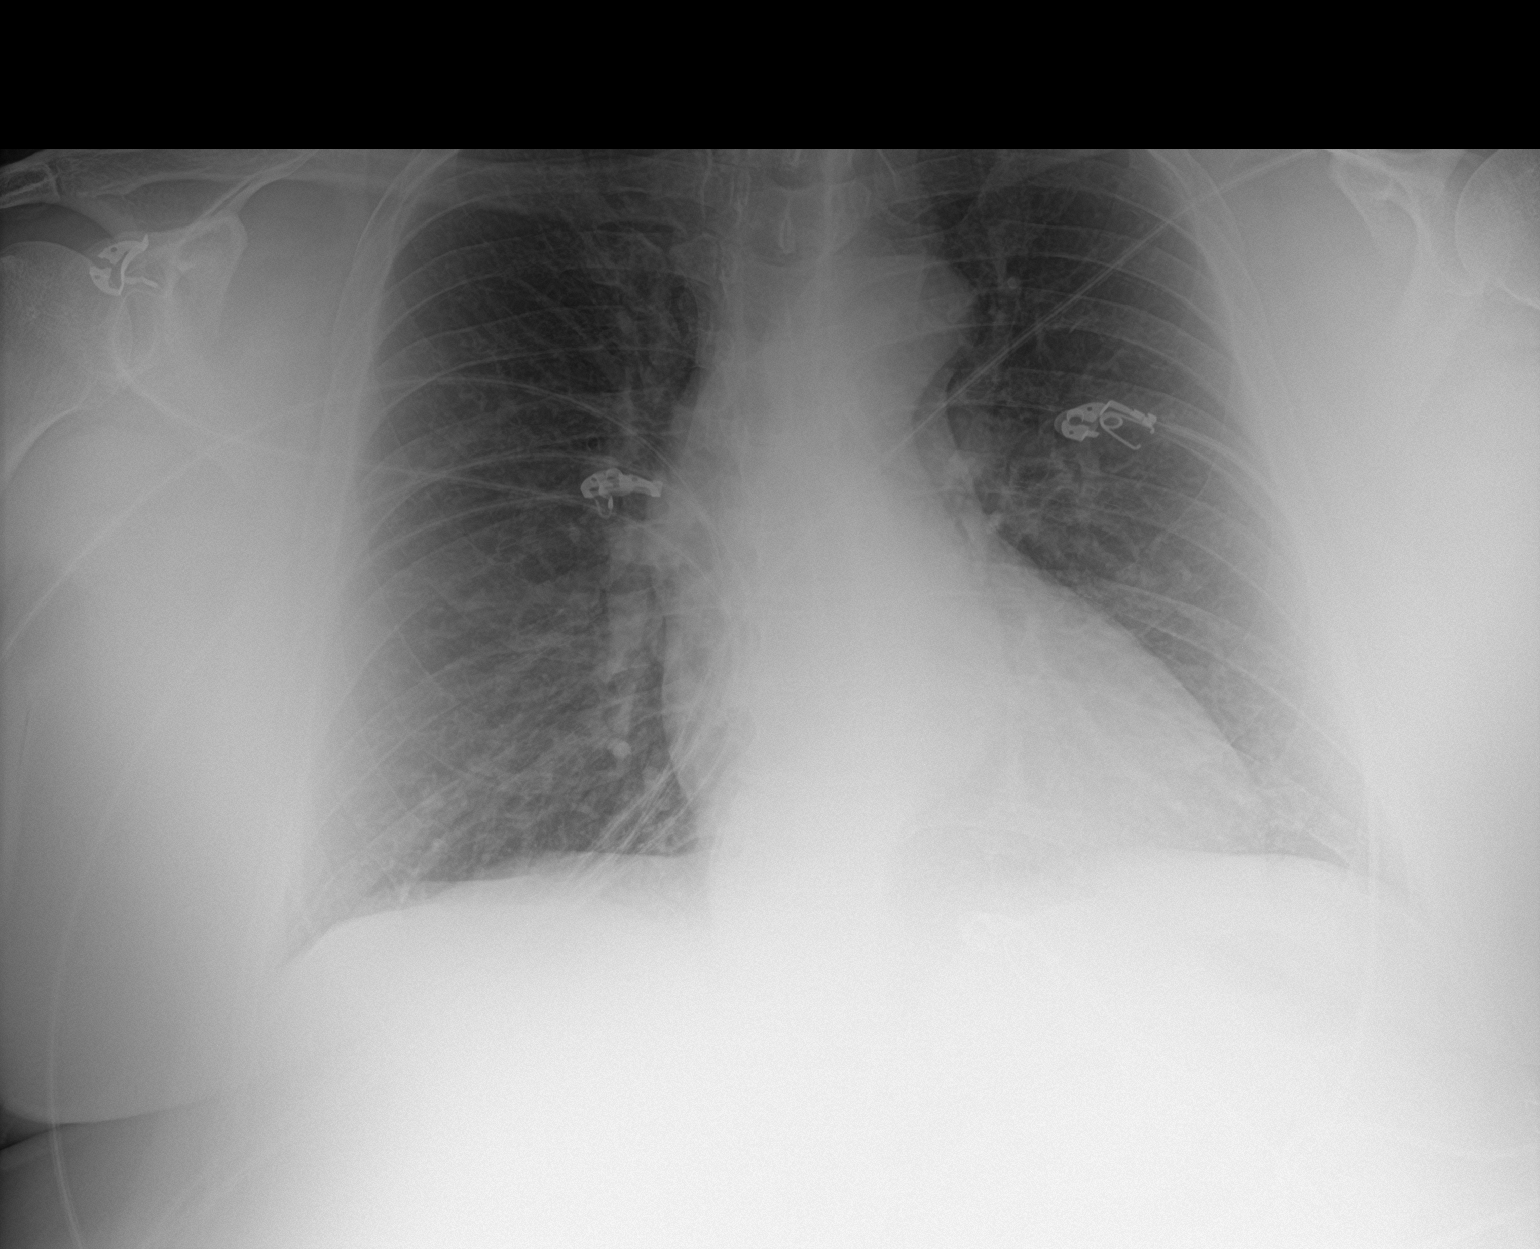

[2 of 2 positions shown; findings below may reference images not displayed]

FINDINGS: The heart size and mediastinal contours are within normal limits.
Both lungs are clear. The visualized skeletal structures are
unremarkable.
IMPRESSION: No active cardiopulmonary disease.

## 2021-12-28 NOTE — ED Notes (Signed)
Pt stated that he wants his IV removed and to leave without waiting for testing/test results. ED provider made aware ?

## 2021-12-28 NOTE — ED Notes (Signed)
Pt seen sitting up in bed, looking over side railings. Pt stated to this RN "it's not fair, I don't want to be here. I can just go and walk down the street. They have me in the hallway and no one has seen me." This RN explained that the ED provider is with another pt at this time and that he will come to his bedside as soon as possible. Pt became increasingly agitated, speaking over this RN and becoming more frustrated in tone. ED provider at pt bedside at this time ?

## 2021-12-28 NOTE — ED Provider Notes (Signed)
Called to bedside by triage nurse for concern of disruptive behavior.  Patient was reportedly brought in by EMS after being found in his neighborhood after hitting his head on the street.  He has a noticeable hematoma to the frontal forehead.  Admits to alcohol use.  Has clear speech at this time.  Is yelling at staff, stating that he wants to be checked out.  EMS had concern that the patient was intentionally hitting his head on the road.  When asked if he has any suicidal thoughts or plans he denies.  He states that he has no plan to kill himself.  He is frustrated because he cannot get his meds.  It is recommended that he stays for full medical evaluation including head imaging however the patient declines.  He is alert and oriented, appears to have capacity to make this decision, does not appear to be acutely intoxicated.  He has not endorse any SI/HI or indications for IVC.  Patient has been ambulating in the triage section with a steady gait.  Plan to [facility]. ?  Marcus Logan, DO ?12/28/21 1911 ? ?

## 2021-12-28 NOTE — ED Provider Notes (Signed)
?[facility] EMERGENCY DEPARTMENT ?Provider Note ? ? ?CSN: [id number] ?Arrival date & time: 12/28/21  2100 ? ?  ? ?History ? ?Chief Complaint  ?Patient presents with  ? Fall  ? Alcohol Intoxication  ? ? ?[name] is a 51 y.o. male. ? ? ?Fall ? ?Alcohol Intoxication ? ? ?This patient is a 51 year old male who is well-known to the emergency department for his history of alcohol abuse, polysubstance abuse, bipolar disorder, he is frequently seen and evaluated, he presents this evening for his second visit to the emergency department after drinking heavily, he reports that he was upset that he could not get his Librium, and because he could not get it he decided to drink alcohol.  He states that he drinks approximately 12 beers per day, they are usually big beers, sometimes he drinks malt liquor, this is a chronic problem for him.  Tonight he came to the emergency department after intentionally striking his head on the ground, he states that he sometimes does this for pain relief due to his substance abuse.  When he was trying to be seen by the physician he decided to leave AGAINST MEDICAL ADVICE, he denied that he was suicidal at that time.  He comes back by EMS after he fell down on the sidewalk.  The patient reports to me that he will not talk to me about the fall but states that he is not suicidal he does not want to hurt himself, sometimes he does hurt himself because it makes him feel good and distracts him from his alcohol use.  He has no suicidal thoughts no homicidal thoughts and is not actively hallucinating.  The patient states he is intoxicated and was drinking alcohol this evening. ? ?The patient continues to talk about his history and the war, he was deployed to [place], he served 1 to work, he states that he has bad anxiety and bipolar disorder related to that. ? ?Home Medications ?Prior to Admission medications   ?Medication Sig Start Date End Date Taking? Authorizing Provider   ?albuterol (VENTOLIN HFA) 108 (90 Base) MCG/ACT inhaler Inhale 2 puffs into the lungs every 6 (six) hours as needed for wheezing or shortness of breath.    [provider]  ?amLODipine (NORVASC) 5 MG tablet Take 1 tablet (5 mg total) by mouth daily. 09/05/20   Benjiman Core, MD  ?calcium carbonate (OSCAL) 1500 (600 Ca) MG TABS tablet Take 1 tablet (1,500 mg total) by mouth 2 (two) times daily with a meal for 3 days. 12/21/21 12/24/21  Pricilla Loveless, MD  ?chlordiazePOXIDE (LIBRIUM) 25 MG capsule 50mg  PO TID x 1D, then 25-50mg  PO BID X 1D, then 25-50mg  PO QD X 1D 12/21/21   12/23/21, MD  ?cloNIDine (CATAPRES) 0.2 MG tablet Take 1 tablet (0.2 mg total) by mouth 3 (three) times daily as needed. 09/05/20   11/03/20, MD  ?diclofenac (VOLTAREN) 50 MG EC tablet Take 50 mg by mouth 3 (three) times daily. 09/29/21   [provider]  ?doxepin (SINEQUAN) 10 MG capsule Take 1 capsule (10 mg total) by mouth at bedtime. 09/05/20   11/03/20, MD  ?escitalopram (LEXAPRO) 10 MG tablet Take 10 mg by mouth daily.    [provider]  ?hydrochlorothiazide (HYDRODIURIL) 25 MG tablet Take 25 mg by mouth every morning. 09/29/21   [provider]  ?hydrOXYzine (ATARAX) 25 MG tablet Take 1 tablet by mouth 3 (three) times daily as needed.    [provider]  ?hydrOXYzine (VISTARIL) 25 MG capsule Take 50 mg by mouth 3 (three) times daily. 09/23/21   [provider]  ?nirmatrelvir & ritonavir (PAXLOVID, 300/100,) 20 x 150 MG & 10 x 100MG  TBPK Take 3 tablets by mouth twice daily. 10/02/21   Lupita LeashMcQuaid, Douglas B, MD  ?ondansetron (ZOFRAN) 4 MG tablet Take 1 tablet (4 mg total) by mouth every 4 (four) hours as needed for nausea or vomiting. 12/24/21 01/01/22  Tanda RockersGray, Samuel A, DO  ?ondansetron (ZOFRAN-ODT) 4 MG disintegrating tablet Take 1 tablet by mouth every 6 (six) hours as needed. 09/18/21   [provider]  ?potassium chloride SA (KLOR-CON M) 20 MEQ tablet Take 2 tablets  (40 mEq total) by mouth 2 (two) times daily for 5 days. 12/21/21 12/26/21  Pricilla LovelessGoldston, Scott, MD  ?potassium chloride SA (KLOR-CON M) 20 MEQ tablet Take 1 tablet (20 mEq total) by mouth 2 (two) times daily for 3 days. 12/24/21 12/27/21  Sloan LeiterGray, Samuel A, DO  ?sucralfate (CARAFATE) 1 g tablet Take 1 tablet (1 g total) by mouth 4 (four) times daily before meals and at bedtime for 7 days. 12/24/21 12/31/21  Sloan LeiterGray, Samuel A, DO  ?   ? ?Allergies    ?Metoprolol   ? ?Review of Systems   ?Review of Systems  ?All other systems reviewed and are negative. ? ?Physical Exam ?Updated Vital Signs ?BP (!) 165/95 (BP Location: Left Arm)   Pulse (!) 110   Temp 98.3 ?F (36.8 ?C) (Oral)   Resp 16   SpO2 98%  ?Physical Exam ?Vitals and nursing note reviewed.  ?Constitutional:   ?   General: He is not in acute distress. ?   Appearance: He is well-developed.  ?HENT:  ?   Head: Normocephalic.  ?   Comments: Hematoma present to the mid forehead with an abrasion overlying this.  No other tenderness to the scalp or the face, no malocclusion, no dental injury. ?   Nose: Nose normal. No congestion or rhinorrhea.  ?   Mouth/Throat:  ?   Mouth: Mucous membranes are moist.  ?   Pharynx: Oropharynx is clear. No oropharyngeal exudate.  ?Eyes:  ?   General: No scleral icterus.    ?   Right eye: No discharge.     ?   Left eye: No discharge.  ?   Conjunctiva/sclera: Conjunctivae normal.  ?   Pupils: Pupils are equal, round, and reactive to light.  ?Neck:  ?   Thyroid: No thyromegaly.  ?   Vascular: No JVD.  ?Cardiovascular:  ?   Rate and Rhythm: Normal rate and regular rhythm.  ?   Heart sounds: Normal heart sounds. No murmur heard. ?  No friction rub. No gallop.  ?Pulmonary:  ?   Effort: Pulmonary effort is normal. No respiratory distress.  ?   Breath sounds: Normal breath sounds. No wheezing or rales.  ?Abdominal:  ?   General: Bowel sounds are normal. There is no distension.  ?   Palpations: Abdomen is soft. There is no mass.  ?   Tenderness: There is no  abdominal tenderness.  ?Musculoskeletal:     ?   General: No tenderness. Normal range of motion.  ?   Cervical back: Normal range of motion and neck supple.  ?   Right lower leg: No edema.  ?   Left lower leg: No edema.  ?Lymphadenopathy:  ?   Cervical: No cervical adenopathy.  ?Skin: ?   General: Skin is warm and dry.  ?  Findings: No erythema or rash.  ?   Comments: Multiple healed scars from self mutilating behavior on bilateral arms  ?Neurological:  ?   Mental Status: He is alert.  ?   Coordination: Coordination normal.  ?   Comments: The patient is awake alert and able to speak in full sentences, he is not slurring his words, his coordination is good, he is able to ambulate.  I have witnessed this patient ambulate in the emergency department and he walks with a stable gait  ?Psychiatric:  ?   Comments: The patient appears anxious, he appears mildly agitated but is not hallucinating is not suicidal and is not homicidal.    ? ? ?ED Results / Procedures / Treatments   ?Labs ?(all labs ordered are listed, but only abnormal results are displayed) ?Labs Reviewed  ?COMPREHENSIVE METABOLIC PANEL  ?CBC WITH DIFFERENTIAL/PLATELET  ?ETHANOL  ?RAPID URINE DRUG SCREEN, HOSP PERFORMED  ? ? ?EKG ?None ? ?Radiology ?No results found. ? ?Procedures ?Procedures  ? ? ?Medications Ordered in ED ?Medications - No data to display ? ?ED Course/ Medical Decision Making/ A&P ?  ?                        ?Medical Decision Making ?Amount and/or Complexity of Data Reviewed ?Labs: ordered. ?Radiology: ordered. ? ? ?He states that he does not want to be in the emergency department anymore, he wants the IV taken out of his arm that was placed by paramedics.  At this time the patient does not appear to be a danger to himself or others, he wishes discharge which I cannot stop him from at this time.  He appears clinically sober ? ?Looking into prior notes the patient had a hematoma in the same spot on his head the last time he was seen few days  ago, it is not clear that any of this is new.  At this time the patient wants to go home, I think this is reasonable, I cannot hold him against his will as he does not appear to be a danger to himself or o

## 2021-12-28 NOTE — ED Notes (Signed)
E-signature pad unavailable at time of pt discharge. This RN discussed discharge materials with pt and answered all pt questions. Pt stated understanding of discharge material. ? ?

## 2021-12-28 NOTE — Discharge Instructions (Signed)
I have recommended that you get x-rays of your brain as well as your spine and blood work to make sure that you are not too sick.  You have chosen to go home, you are leaving against my advice.  You are welcome to return at any time should you change her mind and I encourage you to do so. ?

## 2021-12-28 NOTE — ED Notes (Addendum)
Pt was out at desk during EMS report stating that he could not stay in room because "I see my dead mother's face in there". ? ?Pt back out to desk again and states he wants to sign himself out.  Explained to pt that I would ask the Dr to come speak to him to see if it was ok for him to leave.  Pt began yelling at this nurse saying that no one has been in his room and that it is because he is black and this nurse is white.  Explained that it is really busy right now and that we would be in with him shortly.  Explained that there are multiple pt's near the triage desk and due to privacy I need him to go back to his room.  Pt saying he is going to report this nurse to the CEO.  Explained to pt that due to the reason he was here and bashing his head on the pavement that I have to have the Dr evaluate him to make sure he can leave.  Security called to help speak to pt about going back in the triage room until the doctor comes to evaluate him.  Informed him that I have already messaged the doctor.  Dr. Wilkie Aye came to triage and updated on pt and states that he can sign himself out AMA.  Risk explained to pt and he signed AMA.  States that he is still going to report this nurse and that it is because I believe he is a "black homeless man".  Pt escorted out by security as he continues to yell at this nurse. ?

## 2021-12-28 NOTE — ED Triage Notes (Signed)
Pt BIB [facility], pt had a fall tonight, hematoma and abrasion to forehead. +ETOH use. C-collar placed pta. ?

## 2021-12-28 NOTE — ED Triage Notes (Addendum)
Pt to triage via [facility].  Pt was in his neighborhood and was throwing himself out in the road and bashing his forehead against the street.  Hematoma and abrasion to forehead.  States he purchased ETOH because he couldn't get his meds.  Per EMS pt states, "I wish God would just take me" but didn't endorse suicidal ideation directly. ?

## 2021-12-29 ENCOUNTER — Encounter (HOSPITAL_COMMUNITY): Payer: Self-pay

## 2021-12-29 ENCOUNTER — Emergency Department (HOSPITAL_COMMUNITY)
Admission: EM | Admit: 2021-12-29 | Discharge: 2021-12-29 | Disposition: A | Discharge status: Home / Self Care | Payer: Medicare Other | Attending: Emergency Medicine | Admitting: Emergency Medicine

## 2021-12-29 ENCOUNTER — Other Ambulatory Visit: Payer: Self-pay

## 2021-12-29 DIAGNOSIS — F1010 Alcohol abuse, uncomplicated: Secondary | ICD-10-CM | POA: Insufficient documentation

## 2021-12-29 DIAGNOSIS — F1310 Sedative, hypnotic or anxiolytic abuse, uncomplicated: Secondary | ICD-10-CM | POA: Insufficient documentation

## 2021-12-29 DIAGNOSIS — Z79899 Other long term (current) drug therapy: Secondary | ICD-10-CM | POA: Diagnosis not present

## 2021-12-29 DIAGNOSIS — I10 Essential (primary) hypertension: Secondary | ICD-10-CM | POA: Diagnosis not present

## 2021-12-29 DIAGNOSIS — R253 Fasciculation: Secondary | ICD-10-CM | POA: Insufficient documentation

## 2021-12-29 DIAGNOSIS — D649 Anemia, unspecified: Secondary | ICD-10-CM | POA: Diagnosis not present

## 2021-12-29 DIAGNOSIS — F10130 Alcohol abuse with withdrawal, uncomplicated: Secondary | ICD-10-CM | POA: Diagnosis present

## 2021-12-29 DIAGNOSIS — R251 Tremor, unspecified: Secondary | ICD-10-CM | POA: Diagnosis not present

## 2021-12-29 DIAGNOSIS — Y909 Presence of alcohol in blood, level not specified: Secondary | ICD-10-CM | POA: Diagnosis not present

## 2021-12-29 LAB — COMPREHENSIVE METABOLIC PANEL
ALT: 16 U/L (ref 0–44)
AST: 21 U/L (ref 15–41)
Albumin: 3.6 g/dL (ref 3.5–5.0)
Alkaline Phosphatase: 96 U/L (ref 38–126)
Anion gap: 7 (ref 5–15)
BUN: 11 mg/dL (ref 6–20)
CO2: 26 mmol/L (ref 22–32)
Calcium: 7.8 mg/dL — ABNORMAL LOW (ref 8.9–10.3)
Chloride: 108 mmol/L (ref 98–111)
Creatinine, Ser: 0.71 mg/dL (ref 0.61–1.24)
GFR, Estimated: >60 mL/min (ref >60)
Glucose, Bld: 102 mg/dL — ABNORMAL HIGH (ref 70–99)
Potassium: 2.8 mmol/L — ABNORMAL LOW (ref 3.5–5.1)
Sodium: 141 mmol/L (ref 135–145)
Total Bilirubin: 0.9 mg/dL (ref 0.3–1.2)
Total Protein: 6.7 g/dL (ref 6.5–8.1)

## 2021-12-29 LAB — CBC WITH DIFFERENTIAL/PLATELET
Abs Immature Granulocytes: 0.04 10*3/uL (ref 0.00–0.07)
Basophils Absolute: 0 10*3/uL (ref 0.0–0.1)
Basophils Relative: 0 %
Eosinophils Absolute: 0.2 10*3/uL (ref 0.0–0.5)
Eosinophils Relative: 3 %
HCT: 40.1 % (ref 39.0–52.0)
Hemoglobin: 12.7 g/dL — ABNORMAL LOW (ref 13.0–17.0)
Immature Granulocytes: 1 %
Lymphocytes Relative: 32 %
Lymphs Abs: 1.8 10*3/uL (ref 0.7–4.0)
MCH: 26.9 pg (ref 26.0–34.0)
MCHC: 31.7 g/dL (ref 30.0–36.0)
MCV: 85 fL (ref 80.0–100.0)
Monocytes Absolute: 0.6 10*3/uL (ref 0.1–1.0)
Monocytes Relative: 11 %
Neutro Abs: 2.9 10*3/uL (ref 1.7–7.7)
Neutrophils Relative %: 53 %
Platelets: 204 10*3/uL (ref 150–400)
RBC: 4.72 MIL/uL (ref 4.22–5.81)
RDW: 15.3 % (ref 11.5–15.5)
WBC: 5.5 10*3/uL (ref 4.0–10.5)
nRBC: 0 % (ref 0.0–0.2)

## 2021-12-29 LAB — RAPID URINE DRUG SCREEN, HOSP PERFORMED
Amphetamines: NOT DETECTED
Barbiturates: NOT DETECTED
Benzodiazepines: POSITIVE — AB
Cocaine: NOT DETECTED
Opiates: NOT DETECTED
Tetrahydrocannabinol: NOT DETECTED

## 2021-12-29 LAB — CBG MONITORING, ED: Glucose-Capillary: 99 mg/dL (ref 70–99)

## 2021-12-29 MED ORDER — ONDANSETRON HCL 4 MG/2ML IJ SOLN
4.0000 mg | Freq: Once | INTRAMUSCULAR | Status: AC
  Administered 2021-12-29: 4 mg via INTRAVENOUS
  Filled 2021-12-29: qty 2

## 2021-12-29 MED ORDER — ADULT MULTIVITAMIN W/MINERALS CH
1.0000 | ORAL_TABLET | Freq: Every day | ORAL | Status: DC
  Administered 2021-12-29: 1 via ORAL
  Filled 2021-12-29: qty 1

## 2021-12-29 MED ORDER — SODIUM CHLORIDE 0.9 % IV BOLUS
1000.0000 mL | Freq: Once | INTRAVENOUS | Status: AC
Start: 2021-12-29 — End: 2021-12-29
  Administered 2021-12-29: 1000 mL via INTRAVENOUS

## 2021-12-29 MED ORDER — FOLIC ACID 1 MG PO TABS
1.0000 mg | ORAL_TABLET | Freq: Every day | ORAL | Status: DC
  Administered 2021-12-29: 1 mg via ORAL
  Filled 2021-12-29: qty 1

## 2021-12-29 MED ORDER — LORAZEPAM 2 MG/ML IJ SOLN
1.0000 mg | INTRAMUSCULAR | Status: DC | PRN
  Administered 2021-12-29: 2 mg via INTRAVENOUS
  Filled 2021-12-29: qty 1

## 2021-12-29 MED ORDER — CHLORDIAZEPOXIDE HCL 25 MG PO CAPS: ORAL_CAPSULE | ORAL | 0 refills | Status: DC

## 2021-12-29 MED ORDER — THIAMINE HCL 100 MG PO TABS
100.0000 mg | ORAL_TABLET | Freq: Every day | ORAL | Status: DC
  Administered 2021-12-29: 100 mg via ORAL
  Filled 2021-12-29: qty 1

## 2021-12-29 MED ORDER — LORAZEPAM 1 MG PO TABS: 1.0000 mg | ORAL_TABLET | ORAL | Status: DC | PRN

## 2021-12-29 MED ORDER — CHLORDIAZEPOXIDE HCL 25 MG PO CAPS
25.0000 mg | ORAL_CAPSULE | Freq: Once | ORAL | Status: AC
  Administered 2021-12-29: 25 mg via ORAL
  Filled 2021-12-29: qty 1

## 2021-12-29 MED ORDER — POTASSIUM CHLORIDE 10 MEQ/100ML IV SOLN
10.0000 meq | Freq: Once | INTRAVENOUS | Status: AC
  Administered 2021-12-29: 10 meq via INTRAVENOUS
  Filled 2021-12-29: qty 100

## 2021-12-29 NOTE — ED Notes (Signed)
Pt reports having muscle stiffness in his right arm, making it difficult and painful to move. This issue has been ongoing for a month and he rates the pain an "8" on a scale of "1-10". ?

## 2021-12-29 NOTE — ED Provider Notes (Signed)
?[facility]-EMERGENCY DEPT ?Provider Note ? ? ?CSN: [id number] ?Arrival date & time: 12/29/21  0113 ? ?  ? ?History ? ?Chief Complaint  ?Patient presents with  ? Alcohol Intoxication  ? ? ?[name] is a 51 y.o. male. With past medical history of HTN, obesity, depression, polysubstance abuse, alcohol dependence who presents to the emergency department with complaint of alcohol withdrawal.  ? ?Patient states that he has been out of his librium and drinking. States that he attempted to get his librium from the pharmacy which was closed and "sent him into a spiral." He also endorses that he has been lonely without social support. States that he is drinking 12 cans of 24oz malt liquor daily. Last drink 2-3pm yesterday. States he is having hot flashes, headache, tremors. History of admissions for alcohol withdrawal however no documented history of seizures or DTs. Denies SI/HI.  ? ?HPI ? ?  ? ?Home Medications ?Prior to Admission medications   ?Medication Sig Start Date End Date Taking? Authorizing Provider  ?albuterol (VENTOLIN HFA) 108 (90 Base) MCG/ACT inhaler Inhale 2 puffs into the lungs every 6 (six) hours as needed for wheezing or shortness of breath.    [provider]  ?amLODipine (NORVASC) 5 MG tablet Take 1 tablet (5 mg total) by mouth daily. 09/05/20   Benjiman Core, MD  ?calcium carbonate (OSCAL) 1500 (600 Ca) MG TABS tablet Take 1 tablet (1,500 mg total) by mouth 2 (two) times daily with a meal for 3 days. 12/21/21 12/24/21  Pricilla Loveless, MD  ?chlordiazePOXIDE (LIBRIUM) 25 MG capsule 50mg  PO TID x 1D, then 25-50mg  PO BID X 1D, then 25-50mg  PO QD X 1D 12/21/21   12/23/21, MD  ?cloNIDine (CATAPRES) 0.2 MG tablet Take 1 tablet (0.2 mg total) by mouth 3 (three) times daily as needed. 09/05/20   11/03/20, MD  ?diclofenac (VOLTAREN) 50 MG EC tablet Take 50 mg by mouth 3 (three) times daily. 09/29/21   [provider]  ?doxepin (SINEQUAN) 10 MG capsule Take  1 capsule (10 mg total) by mouth at bedtime. 09/05/20   11/03/20, MD  ?escitalopram (LEXAPRO) 10 MG tablet Take 10 mg by mouth daily.    [provider]  ?hydrochlorothiazide (HYDRODIURIL) 25 MG tablet Take 25 mg by mouth every morning. 09/29/21   [provider]  ?hydrOXYzine (ATARAX) 25 MG tablet Take 1 tablet by mouth 3 (three) times daily as needed.    [provider]  ?hydrOXYzine (VISTARIL) 25 MG capsule Take 50 mg by mouth 3 (three) times daily. 09/23/21   [provider]  ?nirmatrelvir & ritonavir (PAXLOVID, 300/100,) 20 x 150 MG & 10 x 100MG  TBPK Take 3 tablets by mouth twice daily. 10/02/21   , MD  ?ondansetron (ZOFRAN) 4 MG tablet Take 1 tablet (4 mg total) by mouth every 4 (four) hours as needed for nausea or vomiting. 12/24/21 01/01/22  12/26/21 A, DO  ?ondansetron (ZOFRAN-ODT) 4 MG disintegrating tablet Take 1 tablet by mouth every 6 (six) hours as needed. 09/18/21   [provider]  ?potassium chloride SA (KLOR-CON M) 20 MEQ tablet Take 2 tablets (40 mEq total) by mouth 2 (two) times daily for 5 days. 12/21/21 12/26/21  12/23/21, MD  ?potassium chloride SA (KLOR-CON M) 20 MEQ tablet Take 1 tablet (20 mEq total) by mouth 2 (two) times daily for 3 days. 12/24/21 12/27/21  12/26/21, DO  ?sucralfate (CARAFATE) 1 g tablet Take 1 tablet (  1 g total) by mouth 4 (four) times daily before meals and at bedtime for 7 days. 12/24/21 12/31/21  Sloan LeiterGray, Samuel A, DO  ?   ? ?Allergies    ?Metoprolol   ? ?Review of Systems   ?Review of Systems  ?Constitutional:  Positive for diaphoresis.  ?Neurological:  Positive for headaches.  ?Psychiatric/Behavioral:  Negative for suicidal ideas. The patient is nervous/anxious.   ?All other systems reviewed and are negative. ? ?Physical Exam ?Updated Vital Signs ?BP (!) 136/92   Pulse 81   Temp 98.1 ?F (36.7 ?C) (Oral)   Resp 17   Ht 5\' 7"  (1.702 m)   Wt 127 kg   SpO2 95%   BMI 43.85 kg/m?  ?Physical  Exam ?Vitals and nursing note reviewed.  ?Constitutional:   ?   General: He is not in acute distress. ?   Appearance: Normal appearance. He is normal weight. He is ill-appearing. He is not toxic-appearing.  ?HENT:  ?   Head: Normocephalic and atraumatic.  ?   Nose: Nose normal.  ?   Mouth/Throat:  ?   Mouth: Mucous membranes are moist.  ?   Pharynx: Oropharynx is clear.  ?   Comments: Tongue fasciculations  ?Eyes:  ?   General: No scleral icterus. ?   Extraocular Movements: Extraocular movements intact.  ?Cardiovascular:  ?   Rate and Rhythm: Normal rate and regular rhythm.  ?   Pulses: Normal pulses.  ?   Heart sounds: No murmur heard. ?Pulmonary:  ?   Effort: Pulmonary effort is normal. No respiratory distress.  ?   Breath sounds: Normal breath sounds.  ?Abdominal:  ?   General: Abdomen is protuberant. Bowel sounds are normal. There is no distension.  ?   Palpations: Abdomen is soft.  ?   Tenderness: There is no abdominal tenderness.  ?Musculoskeletal:     ?   General: Normal range of motion.  ?   Cervical back: Neck supple.  ?   Comments: Bilateral hand tremors   ?Skin: ?   General: Skin is warm.  ?   Capillary Refill: Capillary refill takes less than 2 seconds.  ?Neurological:  ?   General: No focal deficit present.  ?   Mental Status: He is alert and oriented to person, place, and time.  ?   Cranial Nerves: Cranial nerves 2-12 are intact.  ?   Sensory: Sensation is intact.  ?   Motor: Tremor present.  ? ? ?ED Results / Procedures / Treatments   ?Labs ?(all labs ordered are listed, but only abnormal results are displayed) ?Labs Reviewed  ?COMPREHENSIVE METABOLIC PANEL - Abnormal; Notable for the following components:  ?    Result Value  ? Potassium 2.8 (*)   ? Glucose, Bld 102 (*)   ? Calcium 7.8 (*)   ? All other components within normal limits  ?CBC WITH DIFFERENTIAL/PLATELET - Abnormal; Notable for the following components:  ? Hemoglobin 12.7 (*)   ? All other components within normal limits  ?RAPID URINE  DRUG SCREEN, HOSP PERFORMED - Abnormal; Notable for the following components:  ? Benzodiazepines POSITIVE (*)   ? All other components within normal limits  ?CBG MONITORING, ED  ? ?EKG ?None ? ?Radiology ?No results found. ? ?Procedures ?Procedures  ? ?Medications Ordered in ED ?Medications  ?thiamine tablet 100 mg (100 mg Oral Given 12/29/21 1003)  ?LORazepam (ATIVAN) tablet 1-4 mg ( Oral See Alternative 12/29/21 0807)  ?  Or  ?LORazepam (ATIVAN) injection 1-4  mg (2 mg Intravenous Given 12/29/21 0807)  ?folic acid (FOLVITE) tablet 1 mg (1 mg Oral Given 12/29/21 1003)  ?multivitamin with minerals tablet 1 tablet (1 tablet Oral Given 12/29/21 1003)  ?potassium chloride 10 mEq in 100 mL IVPB (10 mEq Intravenous New Bag/Given 12/29/21 1003)  ?sodium chloride 0.9 % bolus 1,000 mL (1,000 mLs Intravenous New Bag/Given 12/29/21 0806)  ?ondansetron [facility]) injection 4 mg (4 mg Intravenous Given 12/29/21 0807)  ? ? ?ED Course/ Medical Decision Making/ A&P ?  ?                        ?Medical Decision Making ?Amount and/or Complexity of Data Reviewed ?Labs: ordered. ? ?Risk ?OTC drugs. ?Prescription drug management. ? ?This patient presents to the ED for concern of alcohol withdrawal, this involves an extensive number of treatment options, and is a complaint that carries with it a high risk of complications and morbidity.  The differential diagnosis includes withdrawal, DT, seizures ? ?Co morbidities that complicate the patient evaluation ?Polysubstance use  ?HTN ? ?Additional history obtained:  ?Additional history obtained from: none  ?External records from outside source obtained and reviewed including: yesterday ED physician note ? ?EKG: ?EKG: normal sinus rhythm, occasional PVC noted, unifocal.  ? ?Cardiac Monitoring: ?The patient was maintained on a cardiac monitor.  I personally viewed and interpreted the cardiac monitored which showed an underlying rhythm of: sinus rhythm  ? ?Lab Results: ?I personally ordered, reviewed, and  interpreted labs. ?Pertinent results include: ?CBC with anemia 12.7, similar to previous  ?CMP within normal limits  ?Glucose normal  ?UDS + benzos (likely ativan) ? ?Imaging Studies ordered:  ?None required ? ?Medic

## 2021-12-29 NOTE — Discharge Instructions (Signed)
You were seen in the emergency department today for alcohol withdrawal.  Please return to the emergency department any worsening symptoms.  I prescribed you a Librium taper for you to use.  Please pick this up at your pharmacy. ?

## 2021-12-29 NOTE — Progress Notes (Signed)
Substance abuse resources attached to patients AVS ?

## 2021-12-29 NOTE — ED Notes (Addendum)
Pt reports he had a seizure yesterday, and hit his head on the concrete. Things have not been the same since. Will notify RN. ?

## 2021-12-29 NOTE — ED Triage Notes (Signed)
Patient BIB [facility] from the side walk. Having alcohol withdrawal. Last drink was 7 hours ago. He drinks 6 24oz cans in a 7 hour period. Not feeling well. Was seen last night at [facility].  ?

## 2022-01-01 ENCOUNTER — Other Ambulatory Visit (HOSPITAL_COMMUNITY): Payer: Self-pay

## 2022-01-02 ENCOUNTER — Encounter (HOSPITAL_COMMUNITY): Payer: Self-pay

## 2022-01-02 ENCOUNTER — Emergency Department (HOSPITAL_BASED_OUTPATIENT_CLINIC_OR_DEPARTMENT_OTHER): Payer: Medicare Other | Admitting: Radiology

## 2022-01-02 ENCOUNTER — Emergency Department (HOSPITAL_BASED_OUTPATIENT_CLINIC_OR_DEPARTMENT_OTHER)
Admission: EM | Admit: 2022-01-02 | Discharge: 2022-01-02 | Discharge status: Against Medical Advice | Payer: Medicare Other | Attending: Emergency Medicine | Admitting: Emergency Medicine

## 2022-01-02 ENCOUNTER — Emergency Department (HOSPITAL_COMMUNITY)
Admission: EM | Admit: 2022-01-02 | Discharge: 2022-01-02 | Discharge status: Against Medical Advice | Payer: Medicare Other | Source: Home / Self Care

## 2022-01-02 ENCOUNTER — Encounter (HOSPITAL_COMMUNITY): Payer: Self-pay | Admitting: Emergency Medicine

## 2022-01-02 ENCOUNTER — Other Ambulatory Visit: Payer: Self-pay

## 2022-01-02 ENCOUNTER — Emergency Department (HOSPITAL_COMMUNITY): Payer: Medicare Other

## 2022-01-02 DIAGNOSIS — F10129 Alcohol abuse with intoxication, unspecified: Secondary | ICD-10-CM | POA: Insufficient documentation

## 2022-01-02 DIAGNOSIS — W2201XA Walked into wall, initial encounter: Secondary | ICD-10-CM | POA: Insufficient documentation

## 2022-01-02 DIAGNOSIS — W010XXA Fall on same level from slipping, tripping and stumbling without subsequent striking against object, initial encounter: Secondary | ICD-10-CM | POA: Insufficient documentation

## 2022-01-02 DIAGNOSIS — W109XXA Fall (on) (from) unspecified stairs and steps, initial encounter: Secondary | ICD-10-CM | POA: Insufficient documentation

## 2022-01-02 DIAGNOSIS — Z5321 Procedure and treatment not carried out due to patient leaving prior to being seen by health care provider: Secondary | ICD-10-CM | POA: Insufficient documentation

## 2022-01-02 DIAGNOSIS — F1090 Alcohol use, unspecified, uncomplicated: Secondary | ICD-10-CM | POA: Insufficient documentation

## 2022-01-02 DIAGNOSIS — R531 Weakness: Secondary | ICD-10-CM | POA: Insufficient documentation

## 2022-01-02 DIAGNOSIS — M79604 Pain in right leg: Secondary | ICD-10-CM | POA: Insufficient documentation

## 2022-01-02 DIAGNOSIS — F319 Bipolar disorder, unspecified: Secondary | ICD-10-CM | POA: Diagnosis not present

## 2022-01-02 DIAGNOSIS — F419 Anxiety disorder, unspecified: Secondary | ICD-10-CM | POA: Insufficient documentation

## 2022-01-02 DIAGNOSIS — M79601 Pain in right arm: Secondary | ICD-10-CM | POA: Insufficient documentation

## 2022-01-02 DIAGNOSIS — I4891 Unspecified atrial fibrillation: Secondary | ICD-10-CM | POA: Diagnosis not present

## 2022-01-02 DIAGNOSIS — M25561 Pain in right knee: Secondary | ICD-10-CM

## 2022-01-02 LAB — COMPREHENSIVE METABOLIC PANEL
ALT: 21 U/L (ref 0–44)
AST: 27 U/L (ref 15–41)
Albumin: 3.6 g/dL (ref 3.5–5.0)
Alkaline Phosphatase: 91 U/L (ref 38–126)
Anion gap: 7 (ref 5–15)
BUN: 7 mg/dL (ref 6–20)
CO2: 26 mmol/L (ref 22–32)
Calcium: 7.7 mg/dL — ABNORMAL LOW (ref 8.9–10.3)
Chloride: 107 mmol/L (ref 98–111)
Creatinine, Ser: 0.69 mg/dL (ref 0.61–1.24)
GFR, Estimated: >60 mL/min (ref >60)
Glucose, Bld: 89 mg/dL (ref 70–99)
Potassium: 3.2 mmol/L — ABNORMAL LOW (ref 3.5–5.1)
Sodium: 140 mmol/L (ref 135–145)
Total Bilirubin: 0.8 mg/dL (ref 0.3–1.2)
Total Protein: 6.9 g/dL (ref 6.5–8.1)

## 2022-01-02 LAB — CBC WITH DIFFERENTIAL/PLATELET
Abs Immature Granulocytes: 0.02 10*3/uL (ref 0.00–0.07)
Basophils Absolute: 0 10*3/uL (ref 0.0–0.1)
Basophils Relative: 0 %
Eosinophils Absolute: 0.1 10*3/uL (ref 0.0–0.5)
Eosinophils Relative: 2 %
HCT: 39.4 % (ref 39.0–52.0)
Hemoglobin: 12.9 g/dL — ABNORMAL LOW (ref 13.0–17.0)
Immature Granulocytes: 0 %
Lymphocytes Relative: 28 %
Lymphs Abs: 1.5 10*3/uL (ref 0.7–4.0)
MCH: 28.5 pg (ref 26.0–34.0)
MCHC: 32.7 g/dL (ref 30.0–36.0)
MCV: 87 fL (ref 80.0–100.0)
Monocytes Absolute: 0.7 10*3/uL (ref 0.1–1.0)
Monocytes Relative: 12 %
Neutro Abs: 3.1 10*3/uL (ref 1.7–7.7)
Neutrophils Relative %: 58 %
Platelets: 202 10*3/uL (ref 150–400)
RBC: 4.53 MIL/uL (ref 4.22–5.81)
RDW: 16.5 % — ABNORMAL HIGH (ref 11.5–15.5)
WBC: 5.4 10*3/uL (ref 4.0–10.5)
nRBC: 0 % (ref 0.0–0.2)

## 2022-01-02 LAB — ETHANOL: Alcohol, Ethyl (B): 96 mg/dL — ABNORMAL HIGH (ref <10)

## 2022-01-02 NOTE — ED Triage Notes (Addendum)
Pt BIB [facility] from the side of the road. Pt c/o Right side leg pain, specifically to the knee but throughout the leg, pt feels like he has a knot on his knee. Doesn't want anyone to touch his leg.  ?Denies any injury, SOB, dizziness, CP  ? ?Hx of said leg pain, ran out of his prescribed pain medications "for a while" ? ?Ambulate to EMS w/assistance, A/O x4 ? ?Hx of alcohol abuse, hasn't drank in 4 days ? ? ?BP 136/100 ?HR 88 ?RR 18 ?99% RA ?

## 2022-01-02 NOTE — ED Notes (Signed)
RN entered room, pt on the phone. RN asked pt to get vitals signs x2 and patient ignored her, kept talking in angry voice on the phone.  ?

## 2022-01-02 NOTE — ED Triage Notes (Signed)
Patient BIB [facility] from jail where he had been taken to sleep off his intoxication. Patient became upset and bit his arm and started hitting his head against the wall. After patient was told he could leave the jail, patient started telling police that he was going to kill himself. ?

## 2022-01-02 NOTE — ED Notes (Signed)
Pt refuses vitals signs and triage questionnaire. Pt seen walking out of ER and reporting he called a cab. Pt encouraged to stay and be seen however pt is leaving.  ?

## 2022-01-02 NOTE — ED Provider Notes (Signed)
?[facility] EMERGENCY DEPT ?Provider Note ? ? ?CSN: [id number] ?Arrival date & time: 01/02/22  0751 ? ?  ? ?History ? ?Chief Complaint  ?Patient presents with  ? Fall  ? Leg Pain  ? ? ?[name] is a 51 y.o. male. ? ?Pt is a 52 yo male with a pmhx significant for etoh abuse, bipolar d/o, anxiety, depression, ptsd, afib, and cocaine abuse.  Pt said he was walking on the side of the road and tripped and landed on his right knee.  He did not hit his head or have a loc.  He was able to walk after falling.  Pt brought here by EMS. ? ? ?  ? ?Home Medications ?Prior to Admission medications   ?Medication Sig Start Date End Date Taking? Authorizing Provider  ?amLODipine (NORVASC) 5 MG tablet Take 1 tablet (5 mg total) by mouth daily. 09/05/20   Benjiman Core, MD  ?calcium carbonate (OSCAL) 1500 (600 Ca) MG TABS tablet Take 1 tablet (1,500 mg total) by mouth 2 (two) times daily with a meal for 3 days. ?Patient not taking: Reported on 12/29/2021 12/21/21 12/29/21  Pricilla Loveless, MD  ?chlordiazePOXIDE (LIBRIUM) 25 MG capsule 50mg  PO TID x 1D, then 25-50mg  PO BID X 1D, then 25-50mg  PO QD X 1D 12/21/21   12/23/21, MD  ?chlordiazePOXIDE (LIBRIUM) 25 MG capsule 50mg  PO TID x 1D, then 25-50mg  PO BID X 1D, then 25-50mg  PO QD X 1D 12/29/21   , PA-C  ?cloNIDine (CATAPRES) 0.2 MG tablet Take 1 tablet (0.2 mg total) by mouth 3 (three) times daily as needed. 09/05/20   Cristopher Peru, MD  ?[facility] CALCIUM 600 MG tablet Take 1 tablet by mouth 2 (two) times daily. 12/22/21   [provider]  ?diclofenac (VOLTAREN) 50 MG EC tablet Take 50 mg by mouth 3 (three) times daily. 09/29/21   [provider]  ?doxepin (SINEQUAN) 10 MG capsule Take 1 capsule (10 mg total) by mouth at bedtime. 09/05/20   10/01/21, MD  ?escitalopram (LEXAPRO) 10 MG tablet Take 10 mg by mouth daily.    [provider]  ?hydrochlorothiazide (HYDRODIURIL) 25 MG tablet Take 25 mg by mouth every morning.  09/29/21   [provider]  ?hydrOXYzine (VISTARIL) 25 MG capsule Take 25 mg by mouth 3 (three) times daily. 09/23/21   [provider]  ?nirmatrelvir & ritonavir (PAXLOVID, 300/100,) 20 x 150 MG & 10 x 100MG  TBPK Take 3 tablets by mouth twice daily. ?Patient not taking: Reported on 12/29/2021 10/02/21   , MD  ?potassium chloride SA (KLOR-CON M) 20 MEQ tablet Take 2 tablets (40 mEq total) by mouth 2 (two) times daily for 5 days. ?Patient not taking: Reported on 12/29/2021 12/21/21 12/29/21  12/31/2021, MD  ?potassium chloride SA (KLOR-CON M) 20 MEQ tablet Take 1 tablet (20 mEq total) by mouth 2 (two) times daily for 3 days. 12/24/21 12/27/21  Pricilla Loveless, DO  ?sucralfate (CARAFATE) 1 g tablet Take 1 tablet (1 g total) by mouth 4 (four) times daily before meals and at bedtime for 7 days. ?Patient not taking: Reported on 12/29/2021 12/24/21 12/31/21  12/31/2021, DO  ?   ? ?Allergies    ?Metoprolol   ? ?Review of Systems   ?Review of Systems  ?Musculoskeletal:   ?     Right knee pain  ?All other systems reviewed and are negative. ? ?Physical Exam ?Updated Vital Signs ?There were no vitals taken  for this visit. ?Physical Exam ?Vitals and nursing note reviewed.  ?Constitutional:   ?   Appearance: Normal appearance. He is obese.  ?HENT:  ?   Head: Normocephalic and atraumatic.  ?   Right Ear: External ear normal.  ?   Left Ear: External ear normal.  ?   Nose: Nose normal.  ?   Mouth/Throat:  ?   Mouth: Mucous membranes are moist.  ?   Pharynx: Oropharynx is clear.  ?Eyes:  ?   Extraocular Movements: Extraocular movements intact.  ?   Conjunctiva/sclera: Conjunctivae normal.  ?   Pupils: Pupils are equal, round, and reactive to light.  ?Cardiovascular:  ?   Rate and Rhythm: Normal rate and regular rhythm.  ?   Pulses: Normal pulses.  ?   Heart sounds: Normal heart sounds.  ?Pulmonary:  ?   Effort: Pulmonary effort is normal.  ?   Breath sounds: Normal breath sounds.  ?Abdominal:  ?    General: Abdomen is flat. Bowel sounds are normal.  ?   Palpations: Abdomen is soft.  ?Musculoskeletal:  ?   Cervical back: Normal range of motion and neck supple.  ?     Legs: ? ?Skin: ?   General: Skin is warm.  ?   Capillary Refill: Capillary refill takes less than 2 seconds.  ?Neurological:  ?   General: No focal deficit present.  ?   Mental Status: He is alert and oriented to person, place, and time.  ?Psychiatric:     ?   Behavior: Behavior is uncooperative.  ? ?Pt refused to get into a gown so I could examine his knee ? ?ED Results / Procedures / Treatments   ?Labs ?(all labs ordered are listed, but only abnormal results are displayed) ?Labs Reviewed - No data to display ? ?EKG ?None ? ?Radiology ?No results found. ? ?Procedures ?Procedures  ? ? ?Medications Ordered in ED ?Medications - No data to display ? ?ED Course/ Medical Decision Making/ A&P ?  ?                        ?Medical Decision Making ?Amount and/or Complexity of Data Reviewed ?Radiology: ordered. ? ? ?This patient presents to the ED for concern of right knee pain s/p fall, this involves an extensive number of treatment options, and is a complaint that carries with it a high risk of complications and morbidity.  The differential diagnosis includes fx, sprain, contusion ? ? ?Co morbidities that complicate the patient evaluation ? ?etoh abuse, bipolar d/o, anxiety, depression, ptsd, afib, and cocaine abuse ? ? ?Additional history obtained: ? ?Additional history obtained from epic chart review ?External records from outside source obtained and reviewed including EMS report ? ? ? ?Imaging Studies ordered: ? ?I ordered imaging studies including right knee.  Pt refused x-ray. ? ? ?Problem List / ED Course: ? ?Right knee pain:  Pt is able to ambulate without any problems.  No limp observed.  He has refused vital signs and has refused to get into a gown.  He is angry that the ambulance took him here.  He got up and walked out of the  ED. ? ? ? ? ? ? ? ? ? ?Final Clinical Impression(s) / ED Diagnoses ?Final diagnoses:  ?Acute pain of right knee  ? ? ?Rx / DC Orders ?ED Discharge Orders   ? ? None  ? ?  ? ? ?  ?Jacalyn Lefevre, MD ?01/02/22 [phone] ? ?

## 2022-01-02 NOTE — ED Notes (Signed)
This RN made two separate attempts, 0810 and 0815 to finish triage process. Pt refused to talk with this RN or allow me to obtain VS.  ?Pt was on the phone the entire time trying to find a ride to an appointment  ?

## 2022-01-02 NOTE — ED Notes (Signed)
Pt wants to make a phone call doesn't want to wait for triage, states is leaving ?

## 2022-01-02 NOTE — ED Triage Notes (Signed)
Patient states he has been having increased pain and weakness to the right arm and right leg x 2 days. ?Patient states, "I have been having a lot of falls and the last was last night. I hit my forehead on the steps." ? ?Patient states that he drank 4 Locos at 0800 today. ?Patient states he "normally drinks 8 24 ounce Locos in an 8 hour period." ? ?  ?

## 2022-01-02 NOTE — ED Provider Triage Note (Signed)
Emergency Medicine Provider Triage Evaluation Note ? ?Aggie Hacker , a 51 y.o. male  was evaluated in triage.  Pt complains of a fall earlier today. States he drank ETOH, fell and struck his forehead. ? ?Physical Exam  ?BP (!) 139/103 (BP Location: Left Arm)   Pulse 84   Temp 97.9 ?F (36.6 ?C) (Oral)   Resp 18   Ht 5\' 7"  (1.702 m)   Wt 127 kg   SpO2 98%   BMI 43.85 kg/m?  ?Gen:   Awake, no distress   ?Resp:  Normal effort  ?MSK:   Moves extremities without difficulty  ?Other:   ? ?Medical Decision Making  ?Medically screening exam initiated at 7:21 PM.  Appropriate orders placed.  TORIN MODICA was informed that the remainder of the evaluation will be completed by another provider, this initial triage assessment does not replace that evaluation, and the importance of remaining in the ED until their evaluation is complete. ?  ?Aggie Hacker, PA-C ?01/02/22 1923 ? ?

## 2022-01-02 NOTE — ED Notes (Signed)
Pt decided to leave after triage prior to being seen by ED Provider. Pt states that he needs to go eat and nobody is helping him or giving him the sandwich he asked for 5 times.  ?

## 2022-03-07 IMAGING — DX DG CHEST 1V PORT
1 series · 1 of 1 positions shown · non-contrast
Comparison: 06/17/2020

CLINICAL DATA: Cough and wheezing

EXAM:
PORTABLE CHEST 1 VIEW

[chest]
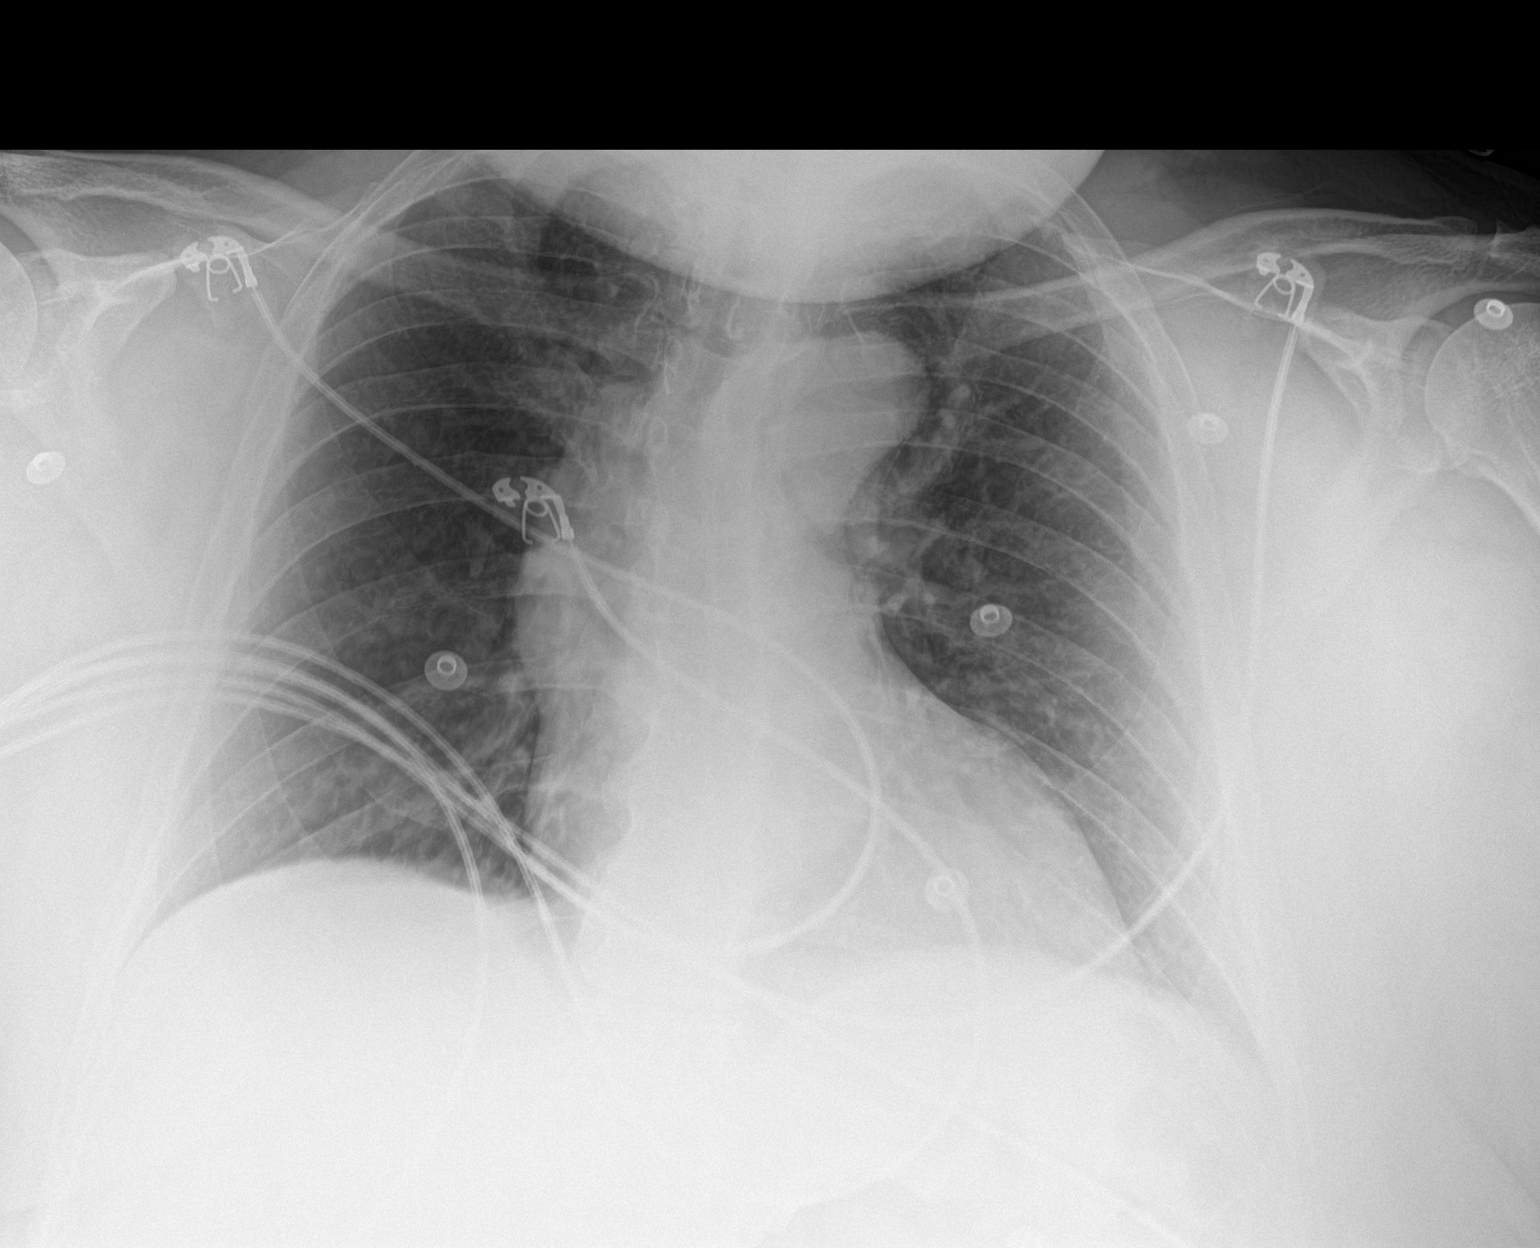

[1 of 1 positions shown; findings below may reference images not displayed]

FINDINGS: Single frontal view of the chest demonstrates an unremarkable
cardiac silhouette. Continued ectasia of the thoracic aorta. There
is no airspace disease, effusion, or pneumothorax. No acute bony
abnormalities.
IMPRESSION: 1. No acute intrathoracic process.

## 2022-07-05 ENCOUNTER — Emergency Department (HOSPITAL_COMMUNITY)
Admission: EM | Admit: 2022-07-05 | Discharge: 2022-07-05 | Disposition: A | Discharge status: Home / Self Care | Payer: Medicare Other | Attending: Emergency Medicine | Admitting: Emergency Medicine

## 2022-07-05 ENCOUNTER — Encounter (HOSPITAL_COMMUNITY): Payer: Self-pay | Admitting: *Deleted

## 2022-07-05 ENCOUNTER — Other Ambulatory Visit: Payer: Self-pay

## 2022-07-05 ENCOUNTER — Emergency Department (HOSPITAL_COMMUNITY): Payer: Medicare Other

## 2022-07-05 DIAGNOSIS — Z1152 Encounter for screening for COVID-19: Secondary | ICD-10-CM | POA: Diagnosis not present

## 2022-07-05 DIAGNOSIS — F1090 Alcohol use, unspecified, uncomplicated: Secondary | ICD-10-CM

## 2022-07-05 DIAGNOSIS — F1010 Alcohol abuse, uncomplicated: Secondary | ICD-10-CM | POA: Diagnosis not present

## 2022-07-05 DIAGNOSIS — R251 Tremor, unspecified: Secondary | ICD-10-CM | POA: Diagnosis not present

## 2022-07-05 DIAGNOSIS — R0602 Shortness of breath: Secondary | ICD-10-CM | POA: Diagnosis not present

## 2022-07-05 DIAGNOSIS — Y901 Blood alcohol level of 20-39 mg/100 ml: Secondary | ICD-10-CM | POA: Insufficient documentation

## 2022-07-05 DIAGNOSIS — Z79899 Other long term (current) drug therapy: Secondary | ICD-10-CM | POA: Diagnosis not present

## 2022-07-05 DIAGNOSIS — R079 Chest pain, unspecified: Secondary | ICD-10-CM | POA: Diagnosis present

## 2022-07-05 LAB — RAPID URINE DRUG SCREEN, HOSP PERFORMED
Amphetamines: NOT DETECTED
Barbiturates: NOT DETECTED
Benzodiazepines: NOT DETECTED
Cocaine: NOT DETECTED
Opiates: NOT DETECTED
Tetrahydrocannabinol: NOT DETECTED

## 2022-07-05 LAB — TROPONIN I (HIGH SENSITIVITY)
Troponin I (High Sensitivity): 5 ng/L (ref <18)
Troponin I (High Sensitivity): 9 ng/L (ref <18)

## 2022-07-05 LAB — ETHANOL: Alcohol, Ethyl (B): 32 mg/dL — ABNORMAL HIGH (ref <10)

## 2022-07-05 LAB — COMPREHENSIVE METABOLIC PANEL
ALT: 13 U/L (ref 0–44)
AST: 20 U/L (ref 15–41)
Albumin: 3.7 g/dL (ref 3.5–5.0)
Alkaline Phosphatase: 89 U/L (ref 38–126)
Anion gap: 12 (ref 5–15)
BUN: 5 mg/dL — ABNORMAL LOW (ref 6–20)
CO2: 21 mmol/L — ABNORMAL LOW (ref 22–32)
Calcium: 8.1 mg/dL — ABNORMAL LOW (ref 8.9–10.3)
Chloride: 107 mmol/L (ref 98–111)
Creatinine, Ser: 0.75 mg/dL (ref 0.61–1.24)
GFR, Estimated: >60 mL/min (ref >60)
Glucose, Bld: 121 mg/dL — ABNORMAL HIGH (ref 70–99)
Potassium: 3.2 mmol/L — ABNORMAL LOW (ref 3.5–5.1)
Sodium: 140 mmol/L (ref 135–145)
Total Bilirubin: 0.6 mg/dL (ref 0.3–1.2)
Total Protein: 6.8 g/dL (ref 6.5–8.1)

## 2022-07-05 LAB — CBC WITH DIFFERENTIAL/PLATELET
Abs Immature Granulocytes: 0.02 10*3/uL (ref 0.00–0.07)
Basophils Absolute: 0 10*3/uL (ref 0.0–0.1)
Basophils Relative: 1 %
Eosinophils Absolute: 0.1 10*3/uL (ref 0.0–0.5)
Eosinophils Relative: 2 %
HCT: 41 % (ref 39.0–52.0)
Hemoglobin: 13.4 g/dL (ref 13.0–17.0)
Immature Granulocytes: 0 %
Lymphocytes Relative: 28 %
Lymphs Abs: 1.3 10*3/uL (ref 0.7–4.0)
MCH: 27.2 pg (ref 26.0–34.0)
MCHC: 32.7 g/dL (ref 30.0–36.0)
MCV: 83.3 fL (ref 80.0–100.0)
Monocytes Absolute: 0.4 10*3/uL (ref 0.1–1.0)
Monocytes Relative: 9 %
Neutro Abs: 2.8 10*3/uL (ref 1.7–7.7)
Neutrophils Relative %: 60 %
Platelets: 252 10*3/uL (ref 150–400)
RBC: 4.92 MIL/uL (ref 4.22–5.81)
RDW: 14.5 % (ref 11.5–15.5)
WBC: 4.7 10*3/uL (ref 4.0–10.5)
nRBC: 0 % (ref 0.0–0.2)

## 2022-07-05 LAB — ACETAMINOPHEN LEVEL: Acetaminophen (Tylenol), Serum: <10 ug/mL — ABNORMAL LOW (ref 10–30)

## 2022-07-05 LAB — RESP PANEL BY RT-PCR (FLU A&B, COVID) ARPGX2
Influenza A by PCR: NEGATIVE
Influenza B by PCR: NEGATIVE
SARS Coronavirus 2 by RT PCR: NEGATIVE

## 2022-07-05 LAB — LIPASE, BLOOD: Lipase: 26 U/L (ref 11–51)

## 2022-07-05 LAB — SALICYLATE LEVEL: Salicylate Lvl: <7 mg/dL — ABNORMAL LOW (ref 7.0–30.0)

## 2022-07-05 MED ORDER — CHLORDIAZEPOXIDE HCL 25 MG PO CAPS
100.0000 mg | ORAL_CAPSULE | Freq: Once | ORAL | Status: AC
  Administered 2022-07-05: 100 mg via ORAL
  Filled 2022-07-05: qty 4

## 2022-07-05 MED ORDER — THIAMINE HCL 100 MG/ML IJ SOLN: 100.0000 mg | Freq: Every day | INTRAMUSCULAR | Status: DC

## 2022-07-05 MED ORDER — THIAMINE MONONITRATE 100 MG PO TABS: 100.0000 mg | ORAL_TABLET | Freq: Every day | ORAL | Status: DC

## 2022-07-05 MED ORDER — LORAZEPAM 2 MG/ML IJ SOLN: 0.0000 mg | Freq: Two times a day (BID) | INTRAMUSCULAR | Status: DC

## 2022-07-05 MED ORDER — LORAZEPAM 1 MG PO TABS: 0.0000 mg | ORAL_TABLET | Freq: Four times a day (QID) | ORAL | Status: DC

## 2022-07-05 MED ORDER — CHLORDIAZEPOXIDE HCL 25 MG PO CAPS: ORAL_CAPSULE | ORAL | 0 refills | Status: DC

## 2022-07-05 MED ORDER — ONDANSETRON HCL 4 MG PO TABS: 4.0000 mg | ORAL_TABLET | Freq: Three times a day (TID) | ORAL | Status: DC | PRN

## 2022-07-05 MED ORDER — LORAZEPAM 1 MG PO TABS: 0.0000 mg | ORAL_TABLET | Freq: Two times a day (BID) | ORAL | Status: DC

## 2022-07-05 MED ORDER — LORAZEPAM 2 MG/ML IJ SOLN
2.0000 mg | Freq: Once | INTRAMUSCULAR | Status: AC
  Administered 2022-07-05: 2 mg via INTRAVENOUS
  Filled 2022-07-05: qty 1

## 2022-07-05 MED ORDER — LORAZEPAM 2 MG/ML IJ SOLN: 0.0000 mg | Freq: Four times a day (QID) | INTRAMUSCULAR | Status: DC

## 2022-07-05 NOTE — ED Triage Notes (Signed)
Pt states he has been drinking  5-24oz/daily (increased from about 3 daily) of beer over the past week, last drink was about 7 hours ago. Pt with tremors in triage. Hx of alcohol withdrawals/seizures. Pt says that he was having (left sided chest tightness) that has improved since meds given by EMS

## 2022-07-05 NOTE — Discharge Instructions (Signed)
Please return for worsening symptoms confusion or seizure-like activity.  Please follow-up with the rehab center.

## 2022-07-05 NOTE — ED Provider Triage Note (Addendum)
Emergency Medicine Provider Triage Evaluation Note  [name] , a 51 y.o. male  was evaluated in triage.  Pt complains of concern for alcohol withdrawal.  Patient's mother just died and he states that he has been binge drinking alcohol for the last week.  Patient quantifying this is 5-6 large beers per day.  States at baseline he usually drinks 3 large beers per day.  Patient very tremulous, anxious, somewhat disoriented.  Endorses brief episode of chest pain which resolved after nitro and aspirin in route with EMS.  Patient tried to quit drinking cold [place] approximately 4 hours ago, felt that he went into withdrawal, started feeling poorly 9 hours ago, did drink some alcohol 7 hours ago to try to help with his symptoms without improvement.  History of DTs in the past.  Review of Systems  Positive: As above, SOB, auditory and visual hallucinations Negative: syncope, nausea  Physical Exam  BP (!) 154/95   Pulse 87   Temp 98.9 F (37.2 C) (Oral)   Resp 16   SpO2 96%  Gen:   Awake, anxious appearing Resp:  Normal effort  MSK:   Moves extremities without difficulty  Other:  Tremulous in bilateral upper extremities when at rest, delay in answering questions, RRR no M/R/G.  Lungs CTA B.  Patient with CIWA ~12  Medical Decision Making  Medically screening exam initiated at 12:30 AM.  Appropriate orders placed.  HALIL RENTZ was informed that the remainder of the evaluation will be completed by another provider, this initial triage assessment does not replace that evaluation, and the importance of remaining in the ED until their evaluation is complete.  Patient's IV in the left AC per EMS, 2 mg IV Ativan ordered due to concern for degree of patient's alcohol withdrawal symptoms in triage.  Charge RN aware, patient to be transported urgently to room 18.  This chart was dictated using voice recognition software, Dragon. Despite the best efforts of this provider to proofread and correct  errors, errors may still occur which can change documentation meaning.     Emeline Darling, PA-C 07/05/22 0030

## 2022-07-05 NOTE — ED Notes (Signed)
Patient verbalizes understanding of d/c instructions. Opportunities for questions and answers were provided. Pt d/c from ED and ambulated to lobby.  

## 2022-07-05 NOTE — ED Provider Notes (Signed)
[facility] EMERGENCY DEPARTMENT Provider Note   CSN: [id number] Arrival date & time: 07/05/22  0010     History  Chief Complaint  Patient presents with   Chest Pain    [name] is a 51 y.o. male.  51 yo M with a chief complaint of chest pain and alcohol withdrawal.  The patient states that he unfortunately has suffered from difficulties with alcohol for some time.  He tends to drink heavily for a few days and then when he tries to stop.  Ends up feeling bad afterwards.  He last drank about 8 hours or so ago.  Woke up and felt very bad.  Was having tremors diffusely.   Chest Pain      Home Medications Prior to Admission medications   Medication Sig Start Date End Date Taking? Authorizing Provider  amLODipine (NORVASC) 5 MG tablet Take 1 tablet (5 mg total) by mouth daily. 09/05/20   Benjiman Core, MD  calcium carbonate (OSCAL) 1500 (600 Ca) MG TABS tablet Take 1 tablet (1,500 mg total) by mouth 2 (two) times daily with a meal for 3 days. Patient not taking: Reported on 12/29/2021 12/21/21 12/29/21  Pricilla Loveless, MD  chlordiazePOXIDE (LIBRIUM) 25 MG capsule 50mg  PO TID x 1D, then 25-50mg  PO BID X 1D, then 25-50mg  PO QD X 1D 07/05/22   13/4/23, DO  cloNIDine (CATAPRES) 0.2 MG tablet Take 1 tablet (0.2 mg total) by mouth 3 (three) times daily as needed. 09/05/20   11/03/20, MD  [employer] CALCIUM 600 MG tablet Take 1 tablet by mouth 2 (two) times daily. 12/22/21   [provider]  diclofenac (VOLTAREN) 50 MG EC tablet Take 50 mg by mouth 3 (three) times daily. 09/29/21   [provider]  doxepin (SINEQUAN) 10 MG capsule Take 1 capsule (10 mg total) by mouth at bedtime. 09/05/20   11/03/20, MD  escitalopram (LEXAPRO) 10 MG tablet Take 10 mg by mouth daily.    [provider]  hydrochlorothiazide (HYDRODIURIL) 25 MG tablet Take 25 mg by mouth every morning. 09/29/21   [provider]  hydrOXYzine (VISTARIL) 25 MG  capsule Take 25 mg by mouth 3 (three) times daily. 09/23/21   [provider]  nirmatrelvir & ritonavir (PAXLOVID, 300/100,) 20 x 150 MG & 10 x 100MG  TBPK Take 3 tablets by mouth twice daily. Patient not taking: Reported on 12/29/2021 10/02/21   12/31/2021, MD  potassium chloride SA (KLOR-CON M) 20 MEQ tablet Take 2 tablets (40 mEq total) by mouth 2 (two) times daily for 5 days. Patient not taking: Reported on 12/29/2021 12/21/21 12/29/21  12/23/21, MD  potassium chloride SA (KLOR-CON M) 20 MEQ tablet Take 1 tablet (20 mEq total) by mouth 2 (two) times daily for 3 days. 12/24/21 12/27/21  12/26/21, DO  sucralfate (CARAFATE) 1 g tablet Take 1 tablet (1 g total) by mouth 4 (four) times daily before meals and at bedtime for 7 days. Patient not taking: Reported on 12/29/2021 12/24/21 12/31/21  12/26/21 A, DO      Allergies    Metoprolol    Review of Systems   Review of Systems  Cardiovascular:  Positive for chest pain.    Physical Exam Updated Vital Signs BP (!) 137/92   Pulse 88   Temp 98.9 F (37.2 C) (Oral)   Resp 17   SpO2 97%  Physical Exam Vitals and nursing note reviewed.  Constitutional:  Appearance: He is well-developed.  HENT:     Head: Normocephalic and atraumatic.  Eyes:     Pupils: Pupils are equal, round, and reactive to light.  Neck:     Vascular: No JVD.  Cardiovascular:     Rate and Rhythm: Normal rate and regular rhythm.     Heart sounds: No murmur heard.    No friction rub. No gallop.  Pulmonary:     Effort: No respiratory distress.     Breath sounds: No wheezing.  Abdominal:     General: There is no distension.     Tenderness: There is no abdominal tenderness. There is no guarding or rebound.  Musculoskeletal:        General: Normal range of motion.     Cervical back: Normal range of motion and neck supple.  Skin:    Coloration: Skin is not pale.     Findings: No rash.  Neurological:     Mental Status: He is alert and  oriented to person, place, and time.  Psychiatric:        Behavior: Behavior normal.     ED Results / Procedures / Treatments   Labs (all labs ordered are listed, but only abnormal results are displayed) Labs Reviewed  COMPREHENSIVE METABOLIC PANEL - Abnormal; Notable for the following components:      Result Value   Potassium 3.2 (*)    CO2 21 (*)    Glucose, Bld 121 (*)    BUN 5 (*)    Calcium 8.1 (*)    All other components within normal limits  ETHANOL - Abnormal; Notable for the following components:   Alcohol, Ethyl (B) 32 (*)    All other components within normal limits  SALICYLATE LEVEL - Abnormal; Notable for the following components:   Salicylate Lvl <7.0 (*)    All other components within normal limits  ACETAMINOPHEN LEVEL - Abnormal; Notable for the following components:   Acetaminophen (Tylenol), Serum <10 (*)    All other components within normal limits  RESP PANEL BY RT-PCR (FLU A&B, COVID) ARPGX2  CBC WITH DIFFERENTIAL/PLATELET  RAPID URINE DRUG SCREEN, HOSP PERFORMED  LIPASE, BLOOD  TROPONIN I (HIGH SENSITIVITY)  TROPONIN I (HIGH SENSITIVITY)    EKG EKG Interpretation  Date/Time:  Saturday July 05 2022 00:16:57 EDT Ventricular Rate:  81 PR Interval:  188 QRS Duration: 92 QT Interval:  358 QTC Calculation: 415 R Axis:   159 Text Interpretation:  Suspect arm lead reversal, interpretation assumes no reversal Normal sinus rhythm Anterolateral infarct , age undetermined ST & T wave abnormality, consider inferior ischemia Abnormal ECG When compared with ECG of 29-Dec-2021 08:33, PREVIOUS ECG IS PRESENT will repeat for lead reversal Confirmed by Drema Pry [phone]) on 07/05/2022 12:29:40 AM  Radiology CT Head Wo Contrast  Result Date: 07/05/2022 CLINICAL DATA:  Head trauma and altered mental status EXAM: CT HEAD WITHOUT CONTRAST TECHNIQUE: Contiguous axial images were obtained from the base of the skull through the vertex without intravenous contrast.  RADIATION DOSE REDUCTION: This exam was performed according to the departmental dose-optimization program which includes automated exposure control, adjustment of the mA and/or kV according to patient size and/or use of iterative reconstruction technique. COMPARISON:  CT head 01/02/2022 FINDINGS: Brain: No intracranial hemorrhage, mass effect, or evidence of acute infarct. No hydrocephalus. No extra-axial fluid collection. Vascular: No hyperdense vessel or unexpected calcification. Skull: No acute fracture or focal lesion. Sinuses/Orbits: No acute finding. Other: None. IMPRESSION: No acute intracranial abnormality. Electronically Signed  By: Minerva Fester M.D.   On: 07/05/2022 03:14   DG Chest Port 1 View  Result Date: 07/05/2022 CLINICAL DATA:  Severe alcohol withdrawal. EXAM: PORTABLE CHEST 1 VIEW COMPARISON:  December 24, 2021 FINDINGS: The cardiac silhouette is borderline in size. Mild, stable prominence of the bilateral hilar vasculature is noted. Stable, marked severity tortuosity of the descending thoracic aorta is seen. Both lungs are clear. The visualized skeletal structures are unremarkable. IMPRESSION: Stable exam without evidence of acute cardiopulmonary disease. Electronically Signed   By: Aram Candela M.D.   On: 07/05/2022 01:22    Procedures Procedures    Medications Ordered in ED Medications  LORazepam (ATIVAN) injection 0-4 mg ( Intravenous Not Given 07/05/22 0030)    Or  LORazepam (ATIVAN) tablet 0-4 mg ( Oral See Alternative 07/05/22 0030)  LORazepam (ATIVAN) injection 0-4 mg (has no administration in time range)    Or  LORazepam (ATIVAN) tablet 0-4 mg (has no administration in time range)  thiamine (VITAMIN B1) tablet 100 mg (has no administration in time range)    Or  thiamine (VITAMIN B1) injection 100 mg (has no administration in time range)  ondansetron (ZOFRAN) tablet 4 mg (has no administration in time range)  LORazepam (ATIVAN) injection 2 mg (2 mg Intravenous  Given 07/05/22 0039)  chlordiazePOXIDE (LIBRIUM) capsule 100 mg (100 mg Oral Given 07/05/22 0122)    ED Course/ Medical Decision Making/ A&P                           Medical Decision Making Amount and/or Complexity of Data Reviewed Radiology: ordered.  Risk Prescription drug management.   51 yo M with a chief complaints of alcohol withdrawal.  The patient is well-appearing nontoxic.  Not obviously in withdrawal clinically.  No significant hypertension no tachycardia.  He was given 2 mg of Ativan prior to my examination.  We will give 100 mg of Librium here.  He is describing some chest discomfort that seems to occur whenever he has withdrawal.  This is pinpoint and to the upper left side of the chest.  He has 2 troponins that are negative.  EKG is nonischemic.  I think this is completely atypical of pulmonary embolism and no further work-up needs to be performed.  We will discharge the patient home.  PCP follow-up.  3:51 AM:  I have discussed the diagnosis/risks/treatment options with the patient.  Evaluation and diagnostic testing in the emergency department does not suggest an emergent condition requiring admission or immediate intervention beyond what has been performed at this time.  They will follow up with PCP. We also discussed returning to the ED immediately if new or worsening sx occur. We discussed the sx which are most concerning (e.g., sudden worsening pain, fever, inability to tolerate by mouth) that necessitate immediate return. Medications administered to the patient during their visit and any new prescriptions provided to the patient are listed below.  Medications given during this visit Medications  LORazepam (ATIVAN) injection 0-4 mg ( Intravenous Not Given 07/05/22 0030)    Or  LORazepam (ATIVAN) tablet 0-4 mg ( Oral See Alternative 07/05/22 0030)  LORazepam (ATIVAN) injection 0-4 mg (has no administration in time range)    Or  LORazepam (ATIVAN) tablet 0-4 mg (has no  administration in time range)  thiamine (VITAMIN B1) tablet 100 mg (has no administration in time range)    Or  thiamine (VITAMIN B1) injection 100 mg (has no administration in  time range)  ondansetron (ZOFRAN) tablet 4 mg (has no administration in time range)  LORazepam (ATIVAN) injection 2 mg (2 mg Intravenous Given 07/05/22 0039)  chlordiazePOXIDE (LIBRIUM) capsule 100 mg (100 mg Oral Given 07/05/22 0122)     The patient appears reasonably screen and/or stabilized for discharge and I doubt any other medical condition or other [facility] requiring further screening, evaluation, or treatment in the ED at this time prior to discharge.            Final Clinical Impression(s) / ED Diagnoses Final diagnoses:  Alcohol use disorder    Rx / DC Orders ED Discharge Orders          Ordered    chlordiazePOXIDE (LIBRIUM) 25 MG capsule  Status:  Discontinued        07/05/22 0346    chlordiazePOXIDE (LIBRIUM) 25 MG capsule        07/05/22 [place], [place], DO 07/05/22 [phone]

## 2022-07-05 NOTE — ED Notes (Signed)
Patient transported to CT 

## 2022-07-05 NOTE — ED Triage Notes (Signed)
Pt arrives via [facility] from home. Per report the pt has been c/o chest pain that started 1 hour ago. Pt reported he had been doing some heavy drinking for about 3 days. Has not had a drink in over 24 hours. He has been experiencing tremors, chest pain, dizziness and sob. Also has not had HTN meds in about 2 months. 324 mg of ASA, 1 nitro given, IV established in the left AC. Lung sounds clear. He did have an episode of feeling faint 12 lead unremarkable, did have PVCS. 110's hr, 99% ra, 178/116, rr 18, cbg 120.

## 2022-07-06 ENCOUNTER — Emergency Department (HOSPITAL_BASED_OUTPATIENT_CLINIC_OR_DEPARTMENT_OTHER)
Admission: EM | Admit: 2022-07-06 | Discharge: 2022-07-06 | Disposition: A | Discharge status: Home / Self Care | Payer: Medicare Other | Source: Home / Self Care | Attending: Emergency Medicine | Admitting: Emergency Medicine

## 2022-07-06 ENCOUNTER — Encounter (HOSPITAL_BASED_OUTPATIENT_CLINIC_OR_DEPARTMENT_OTHER): Payer: Self-pay | Admitting: Emergency Medicine

## 2022-07-06 DIAGNOSIS — F10230 Alcohol dependence with withdrawal, uncomplicated: Secondary | ICD-10-CM | POA: Diagnosis not present

## 2022-07-06 DIAGNOSIS — Y906 Blood alcohol level of 120-199 mg/100 ml: Secondary | ICD-10-CM | POA: Insufficient documentation

## 2022-07-06 DIAGNOSIS — Z79899 Other long term (current) drug therapy: Secondary | ICD-10-CM | POA: Insufficient documentation

## 2022-07-06 DIAGNOSIS — I10 Essential (primary) hypertension: Secondary | ICD-10-CM | POA: Insufficient documentation

## 2022-07-06 DIAGNOSIS — F10129 Alcohol abuse with intoxication, unspecified: Secondary | ICD-10-CM | POA: Insufficient documentation

## 2022-07-06 DIAGNOSIS — J45909 Unspecified asthma, uncomplicated: Secondary | ICD-10-CM | POA: Insufficient documentation

## 2022-07-06 DIAGNOSIS — F1090 Alcohol use, unspecified, uncomplicated: Secondary | ICD-10-CM

## 2022-07-06 LAB — CBC
HCT: 39.6 % (ref 39.0–52.0)
Hemoglobin: 12.9 g/dL — ABNORMAL LOW (ref 13.0–17.0)
MCH: 27.2 pg (ref 26.0–34.0)
MCHC: 32.6 g/dL (ref 30.0–36.0)
MCV: 83.5 fL (ref 80.0–100.0)
Platelets: 249 10*3/uL (ref 150–400)
RBC: 4.74 MIL/uL (ref 4.22–5.81)
RDW: 14.2 % (ref 11.5–15.5)
WBC: 5.8 10*3/uL (ref 4.0–10.5)
nRBC: 0 % (ref 0.0–0.2)

## 2022-07-06 LAB — COMPREHENSIVE METABOLIC PANEL
ALT: 10 U/L (ref 0–44)
AST: 21 U/L (ref 15–41)
Albumin: 4 g/dL (ref 3.5–5.0)
Alkaline Phosphatase: 121 U/L (ref 38–126)
Anion gap: 12 (ref 5–15)
BUN: 12 mg/dL (ref 6–20)
CO2: 22 mmol/L (ref 22–32)
Calcium: 7.9 mg/dL — ABNORMAL LOW (ref 8.9–10.3)
Chloride: 105 mmol/L (ref 98–111)
Creatinine, Ser: 0.77 mg/dL (ref 0.61–1.24)
GFR, Estimated: >60 mL/min (ref >60)
Glucose, Bld: 118 mg/dL — ABNORMAL HIGH (ref 70–99)
Potassium: 3.1 mmol/L — ABNORMAL LOW (ref 3.5–5.1)
Sodium: 139 mmol/L (ref 135–145)
Total Bilirubin: 0.4 mg/dL (ref 0.3–1.2)
Total Protein: 6.9 g/dL (ref 6.5–8.1)

## 2022-07-06 LAB — ETHANOL: Alcohol, Ethyl (B): 145 mg/dL — ABNORMAL HIGH (ref <10)

## 2022-07-06 LAB — RAPID URINE DRUG SCREEN, HOSP PERFORMED
Amphetamines: NOT DETECTED
Barbiturates: NOT DETECTED
Benzodiazepines: POSITIVE — AB
Cocaine: NOT DETECTED
Opiates: NOT DETECTED
Tetrahydrocannabinol: NOT DETECTED

## 2022-07-06 MED ORDER — CHLORDIAZEPOXIDE HCL 25 MG PO CAPS: ORAL_CAPSULE | ORAL | 0 refills | Status: DC

## 2022-07-06 NOTE — Discharge Instructions (Signed)
You were evaluated in the Emergency Department and after careful evaluation, we did not find any emergent condition requiring admission or further testing in the hospital.  Your exam/testing today was overall reassuring.  Use the Librium as directed to treat your withdrawal.  Extremely important that you do not drink alcohol while taking this medicine.  Please return to the Emergency Department if you experience any worsening of your condition.  Thank you for allowing [place] to be a part of your care.

## 2022-07-06 NOTE — ED Provider Notes (Signed)
[facility] Emergency Department Provider Note MRN:  [id number]  Arrival date & time: 07/06/22     Chief Complaint   Alcohol Problem   History of Present Illness   [name] is a 51 y.o. year-old male with a history of alcohol use presenting to the ED with chief complaint of alcohol problem.  Feels like he is withdrawing from alcohol.  Has been binge drinking for a week or so.  Feeling tremulous.  Thinks that they forgot to prescribe his Librium yesterday, was not at the pharmacy.  Review of Systems  A thorough review of systems was obtained and all systems are negative except as noted in the HPI and PMH.   Patient's Health History    Past Medical History:  Diagnosis Date   Alcohol dependence with withdrawal (HCC)    Anxiety    Asthma    Atrial fibrillation (HCC)    not on AC   Bipolar 1 disorder (HCC)    Class 2 obesity    Cocaine abuse (HCC)    Depression    Heart palpitations    with severe anxiety   Hypertension    PTSD (post-traumatic stress disorder)    PTSD (post-traumatic stress disorder)     Past Surgical History:  Procedure Laterality Date   NO PAST SURGERIES      Family History  Problem Relation Age of Onset   Alcohol abuse Mother    Hypertension Father    Dementia Father    Alcohol abuse Maternal Grandfather     Social History   Socioeconomic History   Marital status: Single    Spouse name: Not on file   Number of children: Not on file   Years of education: Not on file   Highest education level: Not on file  Occupational History   Occupation: disabled  Tobacco Use   Smoking status: Never   Smokeless tobacco: Never  Vaping Use   Vaping Use: Never used  Substance and Sexual Activity   Alcohol use: Yes   Drug use: Not Currently    Types: Cocaine   Sexual activity: Not Currently    Comment: pt said he has an enlarged prostate  Other Topics Concern   Not on file  Social History Narrative   ** Merged History  Encounter **       Social Determinants of Health   Financial Resource Strain: Not on file  Food Insecurity: Not on file  Transportation Needs: Not on file  Physical Activity: Not on file  Stress: Not on file  Social Connections: Not on file  Intimate Partner Violence: Not on file     Physical Exam   Vitals:   07/06/22 0116 07/06/22 0146  BP: (!) 135/97   Pulse: 89   Resp: (!) 24 16  Temp: 99.8 F (37.7 C)   SpO2: 98%     CONSTITUTIONAL: Well-appearing, NAD NEURO/PSYCH:  Alert and oriented x 3, no focal deficits EYES:  eyes equal and reactive ENT/NECK:  no LAD, no JVD CARDIO: Regular rate, well-perfused, normal S1 and S2 PULM:  CTAB no wheezing or rhonchi GI/GU:  non-distended, non-tender MSK/SPINE:  No gross deformities, no edema SKIN:  no rash, atraumatic   *Additional and/or pertinent findings included in MDM below  Diagnostic and Interventional Summary    EKG Interpretation  Date/Time:    Ventricular Rate:    PR Interval:    QRS Duration:   QT Interval:    QTC Calculation:   R  Axis:     Text Interpretation:         Labs Reviewed  ETHANOL - Abnormal; Notable for the following components:      Result Value   Alcohol, Ethyl (B) 145 (*)    All other components within normal limits  CBC - Abnormal; Notable for the following components:   Hemoglobin 12.9 (*)    All other components within normal limits  COMPREHENSIVE METABOLIC PANEL  RAPID URINE DRUG SCREEN, HOSP PERFORMED    No orders to display    Medications - No data to display   Procedures  /  Critical Care Procedures  ED Course and Medical Decision Making  Initial Impression and Ddx No signs of withdrawal, actually seems a bit intoxicated.  He admits to drinking this evening to treat the withdrawal because he did not have the Librium.  Sounds like he went to the wrong pharmacy to try to pick it up.  Vitals reassuring, appropriate for discharge with Librium.  Past medical/surgical history  that increases complexity of ED encounter: Alcohol use disorder  Interpretation of Diagnostics I personally reviewed the laboratory assessment and my interpretation is as follows: Elevated ethanol level otherwise no significant blood count or electrolyte disturbance    Patient Reassessment and Ultimate Disposition/Management     Discharge  Patient management required discussion with the following services or consulting groups:  None  Complexity of Problems Addressed Acute illness or injury that poses threat of life of bodily function  Additional Data Reviewed and Analyzed Further history obtained from: Prior ED visit notes and Prior labs/imaging results  Additional Factors Impacting ED Encounter Risk Prescriptions  Barth Kirks. Sedonia Small, [place] [email or web address]  Final Clinical Impressions(s) / ED Diagnoses     ICD-10-CM   1. Alcohol use disorder  F10.90       ED Discharge Orders          Ordered    chlordiazePOXIDE (LIBRIUM) 25 MG capsule        07/06/22 0229             Discharge Instructions Discussed with and Provided to Patient:    Discharge Instructions      You were evaluated in the Emergency Department and after careful evaluation, we did not find any emergent condition requiring admission or further testing in the hospital.  Your exam/testing today was overall reassuring.  Use the Librium as directed to treat your withdrawal.  Extremely important that you do not drink alcohol while taking this medicine.  Please return to the Emergency Department if you experience any worsening of your condition.  Thank you for allowing [place] to be a part of your care.       Maudie Flakes, MD 07/06/22 [phone]

## 2022-07-06 NOTE — ED Triage Notes (Addendum)
Pt presents for alcohol withdrawal, had been sober for months but started drinking heavily again for 1 week. EMS reports tremor pta, given 5mg  versed by EMS via IM injection. Sx improved after this.  EMS vital: 130/79, 104bpm, 99%  Previously used cocaine but has not used this in over a year.   Arrives alert and oriented. Last drink 3 hrs ago (5 24 oz can in past 24hr)  Denies SI/HI

## 2022-07-09 ENCOUNTER — Other Ambulatory Visit: Payer: Self-pay

## 2022-07-09 ENCOUNTER — Encounter (HOSPITAL_COMMUNITY): Payer: Self-pay | Admitting: Emergency Medicine

## 2022-07-09 ENCOUNTER — Inpatient Hospital Stay (HOSPITAL_COMMUNITY)
Admission: EM | Admit: 2022-07-09 | Discharge: 2022-07-10 | DRG: 894 | Discharge status: Against Medical Advice | Payer: Medicare Other | Attending: Internal Medicine | Admitting: Internal Medicine

## 2022-07-09 DIAGNOSIS — F1411 Cocaine abuse, in remission: Secondary | ICD-10-CM | POA: Diagnosis present

## 2022-07-09 DIAGNOSIS — Z811 Family history of alcohol abuse and dependence: Secondary | ICD-10-CM

## 2022-07-09 DIAGNOSIS — E669 Obesity, unspecified: Secondary | ICD-10-CM | POA: Diagnosis present

## 2022-07-09 DIAGNOSIS — R251 Tremor, unspecified: Secondary | ICD-10-CM | POA: Diagnosis present

## 2022-07-09 DIAGNOSIS — Z91199 Patient's noncompliance with other medical treatment and regimen due to unspecified reason: Secondary | ICD-10-CM

## 2022-07-09 DIAGNOSIS — Z6841 Body Mass Index (BMI) 40.0 and over, adult: Secondary | ICD-10-CM

## 2022-07-09 DIAGNOSIS — Z888 Allergy status to other drugs, medicaments and biological substances status: Secondary | ICD-10-CM | POA: Diagnosis not present

## 2022-07-09 DIAGNOSIS — F4310 Post-traumatic stress disorder, unspecified: Secondary | ICD-10-CM | POA: Diagnosis present

## 2022-07-09 DIAGNOSIS — F1010 Alcohol abuse, uncomplicated: Secondary | ICD-10-CM | POA: Diagnosis present

## 2022-07-09 DIAGNOSIS — J45909 Unspecified asthma, uncomplicated: Secondary | ICD-10-CM | POA: Diagnosis present

## 2022-07-09 DIAGNOSIS — I10 Essential (primary) hypertension: Secondary | ICD-10-CM | POA: Diagnosis present

## 2022-07-09 DIAGNOSIS — Z79899 Other long term (current) drug therapy: Secondary | ICD-10-CM

## 2022-07-09 DIAGNOSIS — F319 Bipolar disorder, unspecified: Secondary | ICD-10-CM | POA: Diagnosis present

## 2022-07-09 DIAGNOSIS — Y900 Blood alcohol level of less than 20 mg/100 ml: Secondary | ICD-10-CM | POA: Diagnosis present

## 2022-07-09 DIAGNOSIS — F10930 Alcohol use, unspecified with withdrawal, uncomplicated: Principal | ICD-10-CM

## 2022-07-09 DIAGNOSIS — F10229 Alcohol dependence with intoxication, unspecified: Secondary | ICD-10-CM | POA: Diagnosis present

## 2022-07-09 DIAGNOSIS — G4733 Obstructive sleep apnea (adult) (pediatric): Secondary | ICD-10-CM | POA: Diagnosis present

## 2022-07-09 DIAGNOSIS — I4891 Unspecified atrial fibrillation: Secondary | ICD-10-CM | POA: Diagnosis present

## 2022-07-09 DIAGNOSIS — Z8249 Family history of ischemic heart disease and other diseases of the circulatory system: Secondary | ICD-10-CM

## 2022-07-09 DIAGNOSIS — F10230 Alcohol dependence with withdrawal, uncomplicated: Principal | ICD-10-CM | POA: Diagnosis present

## 2022-07-09 LAB — CBC WITH DIFFERENTIAL/PLATELET
Abs Immature Granulocytes: 0.02 10*3/uL (ref 0.00–0.07)
Basophils Absolute: 0 10*3/uL (ref 0.0–0.1)
Basophils Relative: 0 %
Eosinophils Absolute: 0.1 10*3/uL (ref 0.0–0.5)
Eosinophils Relative: 1 %
HCT: 37.2 % — ABNORMAL LOW (ref 39.0–52.0)
Hemoglobin: 11.8 g/dL — ABNORMAL LOW (ref 13.0–17.0)
Immature Granulocytes: 0 %
Lymphocytes Relative: 21 %
Lymphs Abs: 1.3 10*3/uL (ref 0.7–4.0)
MCH: 27.3 pg (ref 26.0–34.0)
MCHC: 31.7 g/dL (ref 30.0–36.0)
MCV: 85.9 fL (ref 80.0–100.0)
Monocytes Absolute: 0.5 10*3/uL (ref 0.1–1.0)
Monocytes Relative: 8 %
Neutro Abs: 4.4 10*3/uL (ref 1.7–7.7)
Neutrophils Relative %: 70 %
Platelets: 223 10*3/uL (ref 150–400)
RBC: 4.33 MIL/uL (ref 4.22–5.81)
RDW: 14.6 % (ref 11.5–15.5)
WBC: 6.3 10*3/uL (ref 4.0–10.5)
nRBC: 0 % (ref 0.0–0.2)

## 2022-07-09 LAB — COMPREHENSIVE METABOLIC PANEL
ALT: 12 U/L (ref 0–44)
AST: 17 U/L (ref 15–41)
Albumin: 3.2 g/dL — ABNORMAL LOW (ref 3.5–5.0)
Alkaline Phosphatase: 91 U/L (ref 38–126)
Anion gap: 4 — ABNORMAL LOW (ref 5–15)
BUN: 9 mg/dL (ref 6–20)
CO2: 23 mmol/L (ref 22–32)
Calcium: 7.2 mg/dL — ABNORMAL LOW (ref 8.9–10.3)
Chloride: 109 mmol/L (ref 98–111)
Creatinine, Ser: 0.71 mg/dL (ref 0.61–1.24)
GFR, Estimated: >60 mL/min (ref >60)
Glucose, Bld: 94 mg/dL (ref 70–99)
Potassium: 3.5 mmol/L (ref 3.5–5.1)
Sodium: 136 mmol/L (ref 135–145)
Total Bilirubin: 0.4 mg/dL (ref 0.3–1.2)
Total Protein: 6.3 g/dL — ABNORMAL LOW (ref 6.5–8.1)

## 2022-07-09 LAB — CBG MONITORING, ED: Glucose-Capillary: 94 mg/dL (ref 70–99)

## 2022-07-09 LAB — ETHANOL: Alcohol, Ethyl (B): <10 mg/dL (ref <10)

## 2022-07-09 LAB — SALICYLATE LEVEL: Salicylate Lvl: <7 mg/dL — ABNORMAL LOW (ref 7.0–30.0)

## 2022-07-09 LAB — ACETAMINOPHEN LEVEL: Acetaminophen (Tylenol), Serum: <10 ug/mL — ABNORMAL LOW (ref 10–30)

## 2022-07-09 LAB — LIPASE, BLOOD: Lipase: 26 U/L (ref 11–51)

## 2022-07-09 MED ORDER — LORAZEPAM 1 MG PO TABS: 0.0000 mg | ORAL_TABLET | Freq: Four times a day (QID) | ORAL | Status: DC

## 2022-07-09 MED ORDER — SODIUM CHLORIDE 0.9 % IV BOLUS
1000.0000 mL | Freq: Once | INTRAVENOUS | Status: AC
  Administered 2022-07-09: 1000 mL via INTRAVENOUS

## 2022-07-09 MED ORDER — LORAZEPAM 1 MG PO TABS: 0.0000 mg | ORAL_TABLET | Freq: Two times a day (BID) | ORAL | Status: DC

## 2022-07-09 MED ORDER — ESCITALOPRAM OXALATE 10 MG PO TABS
10.0000 mg | ORAL_TABLET | Freq: Every day | ORAL | Status: DC
  Administered 2022-07-10: 10 mg via ORAL
  Filled 2022-07-09: qty 1

## 2022-07-09 MED ORDER — CLONIDINE HCL 0.1 MG PO TABS: 0.1000 mg | ORAL_TABLET | Freq: Three times a day (TID) | ORAL | Status: DC | PRN

## 2022-07-09 MED ORDER — THIAMINE MONONITRATE 100 MG PO TABS
100.0000 mg | ORAL_TABLET | Freq: Every day | ORAL | Status: DC
  Administered 2022-07-10: 100 mg via ORAL
  Filled 2022-07-09 (×2): qty 1

## 2022-07-09 MED ORDER — HYDROCHLOROTHIAZIDE 12.5 MG PO TABS
25.0000 mg | ORAL_TABLET | Freq: Every morning | ORAL | Status: DC
  Administered 2022-07-10: 25 mg via ORAL
  Filled 2022-07-09: qty 2

## 2022-07-09 MED ORDER — ADULT MULTIVITAMIN W/MINERALS CH
1.0000 | ORAL_TABLET | Freq: Every day | ORAL | Status: DC
  Administered 2022-07-10: 1 via ORAL
  Filled 2022-07-09: qty 1

## 2022-07-09 MED ORDER — LORAZEPAM 2 MG/ML IJ SOLN
0.0000 mg | Freq: Four times a day (QID) | INTRAMUSCULAR | Status: DC
  Administered 2022-07-09 – 2022-07-10 (×2): 2 mg via INTRAVENOUS
  Filled 2022-07-09 (×2): qty 1

## 2022-07-09 MED ORDER — AMLODIPINE BESYLATE 5 MG PO TABS
5.0000 mg | ORAL_TABLET | Freq: Every day | ORAL | Status: DC
  Administered 2022-07-10: 5 mg via ORAL
  Filled 2022-07-09: qty 1

## 2022-07-09 MED ORDER — PANTOPRAZOLE SODIUM 20 MG PO TBEC
20.0000 mg | DELAYED_RELEASE_TABLET | Freq: Every day | ORAL | Status: DC
  Administered 2022-07-10: 20 mg via ORAL
  Filled 2022-07-09: qty 1

## 2022-07-09 MED ORDER — THIAMINE HCL 100 MG/ML IJ SOLN
100.0000 mg | Freq: Every day | INTRAMUSCULAR | Status: DC
  Administered 2022-07-09: 100 mg via INTRAVENOUS
  Filled 2022-07-09: qty 2

## 2022-07-09 MED ORDER — FOLIC ACID 1 MG PO TABS
1.0000 mg | ORAL_TABLET | Freq: Every day | ORAL | Status: DC
  Administered 2022-07-10: 1 mg via ORAL
  Filled 2022-07-09: qty 1

## 2022-07-09 MED ORDER — ONDANSETRON HCL 4 MG/2ML IJ SOLN
4.0000 mg | Freq: Once | INTRAMUSCULAR | Status: AC
  Administered 2022-07-09: 4 mg via INTRAVENOUS
  Filled 2022-07-09: qty 2

## 2022-07-09 MED ORDER — THIAMINE HCL 100 MG PO TABS: 100.0000 mg | ORAL_TABLET | Freq: Once | ORAL | Status: DC

## 2022-07-09 MED ORDER — M.V.I. ADULT IV INJ
INTRAVENOUS | Status: DC
  Filled 2022-07-09: qty 5

## 2022-07-09 MED ORDER — FOLIC ACID 1 MG PO TABS
1.0000 mg | ORAL_TABLET | Freq: Once | ORAL | Status: AC
  Administered 2022-07-09: 1 mg via ORAL
  Filled 2022-07-09: qty 1

## 2022-07-09 MED ORDER — LORAZEPAM 2 MG/ML IJ SOLN: 0.0000 mg | Freq: Two times a day (BID) | INTRAMUSCULAR | Status: DC

## 2022-07-09 NOTE — ED Triage Notes (Signed)
BIBA Per EMS: Pt c/o dizziness, weakness, decreased appetite due to Alcohol withdrawals.  VSS  Pt seen for same recently, lost meds he was given

## 2022-07-09 NOTE — H&P (Signed)
History and Physical    [name] [id number] DOB: 12-02-1970 DOA: 07/09/2022  PCP: Pcp, No  Patient coming from: home  I have personally briefly reviewed patient's old medical records in [place]  Chief Complaint: ETOH abuse seeking medically supervised detox  HPI: [name] is a 51 y.o. male with medical history significant of  ETOH abuse with severe withdrawal /seizures and history of intubation during withdrawal episode, bipolar d/o, anxiety, asthma , hx of cocaine abuse in remission x 4 months, PTSD, Hypertension who has interim history of  2  ED visitis 11/4, 11/5 seeking assistance for his ETOH abuse.  At that time patient was noted to be intoxicated without active withdrawal symptoms and was discharged with librium.  Of note on the 5th,patient presented to ED seeking prescription for librium but it appears patient went to wrong pharmacy s/p visit on 11/4. IN any event patient returns to ED seeking inpatient detox as he notes he is unable to detox as outpatient. He states he feels medications given not effective in  controlling his symptoms, so he returns to drinking. He states he has not eaten in the last 2 days due to continuous drinking. Patient states his last drink was 4 hours PTA. Of note ETOH level <10 on arrival. He notes no fever /chills/ n/v/ diarrhea/dysuria but does note increase GERD symptoms.   ED Course:  Afeb, bp 160/90 , hr 89, rr 21  EKG: snr pvc Wbc:6.3,  Hgb 11.8, plt 223 Tylenol level < 10  Etoh level < 10  Saliciylate level < 7 Na 136, K 3.5,  ast 17, alt12 Lipase 26 Tx  thiamine, 1LR,zofran , folic acid  Review of Systems: As per HPI otherwise 10 point review of systems negative.   Past Medical History:  Diagnosis Date   Alcohol dependence with withdrawal ([place])    Anxiety    Asthma    Atrial fibrillation (HCC)    not on AC   Bipolar 1 disorder (HCC)    Class 2 obesity    Cocaine abuse ([place])    Depression    Heart palpitations     with severe anxiety   Hypertension    PTSD (post-traumatic stress disorder)    PTSD (post-traumatic stress disorder)     Past Surgical History:  Procedure Laterality Date   NO PAST SURGERIES       reports that he has never smoked. He has never used smokeless tobacco. He reports current alcohol use. He reports that he does not currently use drugs after having used the following drugs: Cocaine.  Allergies  Allergen Reactions   Metoprolol Other (See Comments)    Other reaction(s): Other Prostate problem Other reaction(s): Other Prostate problem    Family History  Problem Relation Age of Onset   Alcohol abuse Mother    Hypertension Father    Dementia Father    Alcohol abuse Maternal Grandfather     Prior to Admission medications   Medication Sig Start Date End Date Taking? Authorizing Provider  amLODipine (NORVASC) 5 MG tablet Take 1 tablet (5 mg total) by mouth daily. 09/05/20   Davonna Belling, MD  calcium carbonate (OSCAL) 1500 (600 Ca) MG TABS tablet Take 1 tablet (1,500 mg total) by mouth 2 (two) times daily with a meal for 3 days. Patient not taking: Reported on 12/29/2021 12/21/21 12/29/21  Sherwood Gambler, MD  chlordiazePOXIDE (LIBRIUM) 25 MG capsule 50mg  PO TID x 1D, then 25-50mg  PO BID X 1D, then 25-50mg   PO QD X 1D 07/06/22   Maudie Flakes, MD  cloNIDine (CATAPRES) 0.2 MG tablet Take 1 tablet (0.2 mg total) by mouth 3 (three) times daily as needed. 09/05/20   Davonna Belling, MD  [facility] CALCIUM 600 MG tablet Take 1 tablet by mouth 2 (two) times daily. 12/22/21   [provider]  diclofenac (VOLTAREN) 50 MG EC tablet Take 50 mg by mouth 3 (three) times daily. 09/29/21   [provider]  doxepin (SINEQUAN) 10 MG capsule Take 1 capsule (10 mg total) by mouth at bedtime. 09/05/20   Davonna Belling, MD  escitalopram (LEXAPRO) 10 MG tablet Take 10 mg by mouth daily.    [provider]  hydrochlorothiazide (HYDRODIURIL) 25 MG tablet Take 25 mg by mouth  every morning. 09/29/21   [provider]  hydrOXYzine (VISTARIL) 25 MG capsule Take 25 mg by mouth 3 (three) times daily. 09/23/21   [provider]  nirmatrelvir & ritonavir (PAXLOVID, 300/100,) 20 x 150 MG & 10 x 100MG  TBPK Take 3 tablets by mouth twice daily. Patient not taking: Reported on 12/29/2021 10/02/21   Juanito Doom, MD  potassium chloride SA (KLOR-CON M) 20 MEQ tablet Take 2 tablets (40 mEq total) by mouth 2 (two) times daily for 5 days. Patient not taking: Reported on 12/29/2021 12/21/21 12/29/21  Sherwood Gambler, MD  potassium chloride SA (KLOR-CON M) 20 MEQ tablet Take 1 tablet (20 mEq total) by mouth 2 (two) times daily for 3 days. 12/24/21 12/27/21  Jeanell Sparrow, DO  sucralfate (CARAFATE) 1 g tablet Take 1 tablet (1 g total) by mouth 4 (four) times daily before meals and at bedtime for 7 days. Patient not taking: Reported on 12/29/2021 12/24/21 12/31/21  Jeanell Sparrow, DO    Physical Exam: Vitals:   07/09/22 1934 07/09/22 2000 07/09/22 2030 07/09/22 2100  BP: (!) 160/90 (!) 166/114 (!) 173/122 (!) 151/110  Pulse: 90 89 86 84  Resp:  (!) 21 20 18   Temp:      TempSrc:      SpO2:  95% 96% 98%  Weight:      Height:        Constitutional: NAD, calm, comfortable, avoid eye contact Vitals:   07/09/22 1934 07/09/22 2000 07/09/22 2030 07/09/22 2100  BP: (!) 160/90 (!) 166/114 (!) 173/122 (!) 151/110  Pulse: 90 89 86 84  Resp:  (!) 21 20 18   Temp:      TempSrc:      SpO2:  95% 96% 98%  Weight:      Height:       Eyes: PERRL, lids and conjunctivae normal ENMT: Mucous membranes are moist.Normal dentition.  Neck: normal, supple, no masses, no thyromegaly Respiratory: clear to auscultation bilaterally, no wheezing, no crackles. Normal respiratory effort. No accessory muscle use.  Cardiovascular: Regular rate and rhythm, no murmurs / rubs / gallops. No extremity edema. Warm extremities  Abdomen: no tenderness, no masses palpated. No hepatosplenomegaly. Bowel  sounds positive.  Musculoskeletal: no clubbing / cyanosis. No joint deformity upper and lower extremities. Good ROM, no contractures. Normal muscle tone.  Skin: no rashes, lesions, ulcers. No induration Neurologic: CN 2-12 grossly intact. Sensation intact, Strength 5/5 in all 4.  Psychiatric: Normal judgment and insight. Alert and oriented x 3.  No SI or HI   Labs on Admission: I have personally reviewed following labs and imaging studies  CBC: Recent Labs  Lab 07/05/22 0033 07/06/22 0132 07/09/22 1930  WBC 4.7 5.8 6.3  NEUTROABS 2.8  --  4.4  HGB 13.4 12.9* 11.8*  HCT 41.0 39.6 37.2*  MCV 83.3 83.5 85.9  PLT 252 249 223   Basic Metabolic Panel: Recent Labs  Lab 07/05/22 0033 07/06/22 0132 07/09/22 1930  NA 140 139 136  K 3.2* 3.1* 3.5  CL 107 105 109  CO2 21* 22 23  GLUCOSE 121* 118* 94  BUN 5* 12 9  CREATININE 0.75 0.77 0.71  CALCIUM 8.1* 7.9* 7.2*   GFR: Estimated Creatinine Clearance: 136.6 mL/min (by C-G formula based on SCr of 0.71 mg/dL). Liver Function Tests: Recent Labs  Lab 07/05/22 0033 07/06/22 0132 07/09/22 1930  AST 20 21 17   ALT 13 10 12   ALKPHOS 89 121 91  BILITOT 0.6 0.4 0.4  PROT 6.8 6.9 6.3*  ALBUMIN 3.7 4.0 3.2*   Recent Labs  Lab 07/05/22 0033 07/09/22 1930  LIPASE 26 26   No results for input(s): "AMMONIA" in the last 168 hours. Coagulation Profile: No results for input(s): "INR", "PROTIME" in the last 168 hours. Cardiac Enzymes: No results for input(s): "CKTOTAL", "CKMB", "CKMBINDEX", "TROPONINI" in the last 168 hours. BNP (last 3 results) No results for input(s): "PROBNP" in the last 8760 hours. HbA1C: No results for input(s): "HGBA1C" in the last 72 hours. CBG: Recent Labs  Lab 07/09/22 1843  GLUCAP 94   Lipid Profile: No results for input(s): "CHOL", "HDL", "LDLCALC", "TRIG", "CHOLHDL", "LDLDIRECT" in the last 72 hours. Thyroid Function Tests: No results for input(s): "TSH", "T4TOTAL", "FREET4", "T3FREE",  "THYROIDAB" in the last 72 hours. Anemia Panel: No results for input(s): "VITAMINB12", "FOLATE", "FERRITIN", "TIBC", "IRON", "RETICCTPCT" in the last 72 hours. Urine analysis:    Component Value Date/Time   COLORURINE YELLOW 12/21/2021 1703   APPEARANCEUR CLEAR 12/21/2021 1703   LABSPEC 1.011 12/21/2021 1703   PHURINE 8.0 12/21/2021 1703   GLUCOSEU NEGATIVE 12/21/2021 1703   HGBUR NEGATIVE 12/21/2021 1703   BILIRUBINUR NEGATIVE 12/21/2021 1703   KETONESUR NEGATIVE 12/21/2021 1703   PROTEINUR NEGATIVE 12/21/2021 1703   UROBILINOGEN 0.2 04/09/2015 0206   NITRITE NEGATIVE 12/21/2021 1703   LEUKOCYTESUR NEGATIVE 12/21/2021 1703    Radiological Exams on Admission: No results found.  EKG: Independently reviewed.see above   Assessment/Plan  ETOH abuse -hx of etoh w/d sz/ severe withdrawals requiring intubation -admit with withdrawal seeking medically supervised detox  - place on ciwa protocol  - mvi bag x 3  -mvi , thiamine/ folate  -SW to follow assist with discharge needs   Bipolar d/o Anxiety PTSD  -resume home regimen once med rec completed   Hypertension , Uncontrolled  -resume oral medications -prn clonidine   Hx of cocaine abuse -per patient in remission x 4 months - UDS pending   Asthma  -no acute exacerbation  -prn nebs     DVT prophylaxis: heparin Code Status: full  Family Communication: N/A Disposition Plan: patient  expected to be admitted greater than 2 midnights  Consults called: n/a Admission status: progressive care    12/23/2021 MD [facility]   If 7PM-7AM, please contact night-coverage [email or web address] Password [facility]  07/09/2022, 9:56 PM

## 2022-07-09 NOTE — ED Provider Notes (Signed)
[facility] DEPT Provider Note   CSN: [id number] Arrival date & time: 07/09/22  1831     History  Chief Complaint  Patient presents with   Alcohol Intoxication   Dizziness    [name] is a 51 y.o. male.  Patient is a 51 yo male with pmh of alcohol abuse and PTSD presenting for alcohol intoxication. Pt states he was seen in ED at [facility] 2 weeks ago and sent home on librium. States he tried to take the librium but was suffering from severe tremors so he drank alcohol. States he wants to stop at this time but he can't. States he normally drinks 6 24 oz beers a day. States his last drink was 4 hours. Patient admits to previous hospitalization for withdrawal symptoms, intubation, and seizures. Patient admits to decreased appetite and weakness.   The history is provided by the patient. No language interpreter was used.  Alcohol Intoxication Pertinent negatives include no chest pain, no abdominal pain and no shortness of breath.  Dizziness Associated symptoms: no chest pain, no palpitations, no shortness of breath and no vomiting        Home Medications Prior to Admission medications   Medication Sig Start Date End Date Taking? Authorizing Provider  amLODipine (NORVASC) 5 MG tablet Take 1 tablet (5 mg total) by mouth daily. 09/05/20   Davonna Belling, MD  calcium carbonate (OSCAL) 1500 (600 Ca) MG TABS tablet Take 1 tablet (1,500 mg total) by mouth 2 (two) times daily with a meal for 3 days. Patient not taking: Reported on 12/29/2021 12/21/21 12/29/21  Sherwood Gambler, MD  chlordiazePOXIDE (LIBRIUM) 25 MG capsule 50mg  PO TID x 1D, then 25-50mg  PO BID X 1D, then 25-50mg  PO QD X 1D 07/06/22   Maudie Flakes, MD  cloNIDine (CATAPRES) 0.2 MG tablet Take 1 tablet (0.2 mg total) by mouth 3 (three) times daily as needed. 09/05/20   Davonna Belling, MD  [facility] CALCIUM 600 MG tablet Take 1 tablet by mouth 2 (two) times daily. 12/22/21   [provider]   diclofenac (VOLTAREN) 50 MG EC tablet Take 50 mg by mouth 3 (three) times daily. 09/29/21   [provider]  doxepin (SINEQUAN) 10 MG capsule Take 1 capsule (10 mg total) by mouth at bedtime. 09/05/20   Davonna Belling, MD  escitalopram (LEXAPRO) 10 MG tablet Take 10 mg by mouth daily.    [provider]  hydrochlorothiazide (HYDRODIURIL) 25 MG tablet Take 25 mg by mouth every morning. 09/29/21   [provider]  hydrOXYzine (VISTARIL) 25 MG capsule Take 25 mg by mouth 3 (three) times daily. 09/23/21   [provider]  nirmatrelvir & ritonavir (PAXLOVID, 300/100,) 20 x 150 MG & 10 x 100MG  TBPK Take 3 tablets by mouth twice daily. Patient not taking: Reported on 12/29/2021 10/02/21   Juanito Doom, MD  potassium chloride SA (KLOR-CON M) 20 MEQ tablet Take 2 tablets (40 mEq total) by mouth 2 (two) times daily for 5 days. Patient not taking: Reported on 12/29/2021 12/21/21 12/29/21  Sherwood Gambler, MD  potassium chloride SA (KLOR-CON M) 20 MEQ tablet Take 1 tablet (20 mEq total) by mouth 2 (two) times daily for 3 days. 12/24/21 12/27/21  Jeanell Sparrow, DO  sucralfate (CARAFATE) 1 g tablet Take 1 tablet (1 g total) by mouth 4 (four) times daily before meals and at bedtime for 7 days. Patient not taking: Reported on 12/29/2021 12/24/21 12/31/21  Jeanell Sparrow, DO  Allergies    Metoprolol    Review of Systems   Review of Systems  Constitutional:  Negative for chills and fever.  HENT:  Negative for ear pain and sore throat.   Eyes:  Negative for pain and visual disturbance.  Respiratory:  Negative for cough and shortness of breath.   Cardiovascular:  Negative for chest pain and palpitations.  Gastrointestinal:  Negative for abdominal pain and vomiting.  Genitourinary:  Negative for dysuria and hematuria.  Musculoskeletal:  Negative for arthralgias and back pain.  Skin:  Negative for color change and rash.  Neurological:  Positive for dizziness. Negative for  seizures and syncope.  All other systems reviewed and are negative.   Physical Exam Updated Vital Signs BP (!) 151/110   Pulse 84   Temp 97.9 F (36.6 C) (Oral)   Resp 18   Ht 5\' 7"  (1.702 m)   Wt 119.3 kg   SpO2 98%   BMI 41.19 kg/m  Physical Exam Vitals and nursing note reviewed.  Constitutional:      General: He is not in acute distress.    Appearance: He is well-developed.  HENT:     Head: Normocephalic and atraumatic.  Eyes:     Conjunctiva/sclera: Conjunctivae normal.  Cardiovascular:     Rate and Rhythm: Normal rate and regular rhythm.     Heart sounds: No murmur heard. Pulmonary:     Effort: Pulmonary effort is normal. No respiratory distress.     Breath sounds: Normal breath sounds.  Abdominal:     Palpations: Abdomen is soft.     Tenderness: There is no abdominal tenderness.  Musculoskeletal:        General: No swelling.     Cervical back: Neck supple.  Skin:    General: Skin is warm and dry.     Capillary Refill: Capillary refill takes less than 2 seconds.  Neurological:     General: No focal deficit present.     Mental Status: He is alert and oriented to person, place, and time.     GCS: GCS eye subscore is 4. GCS verbal subscore is 5. GCS motor subscore is 6.     Cranial Nerves: Cranial nerves 2-12 are intact.     Motor: Tremor present.     Coordination: Coordination is intact.     Gait: Gait is intact.  Psychiatric:        Mood and Affect: Mood normal.     ED Results / Procedures / Treatments   Labs (all labs ordered are listed, but only abnormal results are displayed) Labs Reviewed  CBC WITH DIFFERENTIAL/PLATELET - Abnormal; Notable for the following components:      Result Value   Hemoglobin 11.8 (*)    HCT 37.2 (*)    All other components within normal limits  COMPREHENSIVE METABOLIC PANEL - Abnormal; Notable for the following components:   Calcium 7.2 (*)    Total Protein 6.3 (*)    Albumin 3.2 (*)    Anion gap 4 (*)    All other  components within normal limits  ACETAMINOPHEN LEVEL - Abnormal; Notable for the following components:   Acetaminophen (Tylenol), Serum <10 (*)    All other components within normal limits  SALICYLATE LEVEL - Abnormal; Notable for the following components:   Salicylate Lvl <7.0 (*)    All other components within normal limits  LIPASE, BLOOD  ETHANOL  RAPID URINE DRUG SCREEN, HOSP PERFORMED  BLOOD GAS, VENOUS  CBG MONITORING, ED  EKG None  Radiology No results found.  Procedures Procedures    Medications Ordered in ED Medications  LORazepam (ATIVAN) injection 0-4 mg (2 mg Intravenous Given 07/09/22 1945)    Or  LORazepam (ATIVAN) tablet 0-4 mg ( Oral See Alternative 07/09/22 1945)  LORazepam (ATIVAN) injection 0-4 mg (has no administration in time range)    Or  LORazepam (ATIVAN) tablet 0-4 mg (has no administration in time range)  thiamine (VITAMIN B1) tablet 100 mg ( Oral See Alternative 07/09/22 1945)    Or  thiamine (VITAMIN B1) injection 100 mg (100 mg Intravenous Given 07/09/22 1945)  sodium chloride 0.9 % bolus 1,000 mL (0 mLs Intravenous Stopped 07/09/22 2124)  ondansetron (ZOFRAN) injection 4 mg (4 mg Intravenous Given [id number])  folic acid (FOLVITE) tablet 1 mg (1 mg Oral Given 07/09/22 1946)    ED Course/ Medical Decision Making/ A&P                           Medical Decision Making Amount and/or Complexity of Data Reviewed Labs: ordered.  Risk OTC drugs. Prescription drug management. Decision regarding hospitalization.   9:58 PM 51 yo male with pmh of alcohol abuse and PTSD presenting for alcohol intoxication.  Is alert oriented x3, hypertensive, with otherwise stable vital signs.  On physical exam he has no neurovascular deficits or signs of trauma.  He is tremulous.  Ethanol level negative.  Patient states he drank 4 hours prior to arrival. IV fluids, folic acid, and thiamine given. Stable laboratory studies.  Concern for severe withdrawal  symptoms if patient is sent home today given his history of intubation and seizures from withdrawals.  He is already failed outpatient Librium stating he had severe tremors despite taking it.  I spoke with admitting team who agrees to accept patient at this time.        Final Clinical Impression(s) / ED Diagnoses Final diagnoses:  Alcohol withdrawal syndrome without complication [facility])    Rx / DC Orders ED Discharge Orders     None         Lianne Cure, DO [id number]

## 2022-07-09 NOTE — ED Notes (Signed)
Pt care taken, is apologetic for coming in, reassured him that we are here to help him, no other complaints at this time.

## 2022-07-10 LAB — BLOOD GAS, VENOUS
Acid-base deficit: 1.3 mmol/L (ref 0.0–2.0)
Bicarbonate: 24.3 mmol/L (ref 20.0–28.0)
O2 Saturation: 75.8 %
Patient temperature: 37
pCO2, Ven: 43 mmHg — ABNORMAL LOW (ref 44–60)
pH, Ven: 7.36 (ref 7.25–7.43)
pO2, Ven: 42 mmHg (ref 32–45)

## 2022-07-10 LAB — MAGNESIUM: Magnesium: 1.8 mg/dL (ref 1.7–2.4)

## 2022-07-10 MED ORDER — ACETAMINOPHEN 325 MG PO TABS
650.0000 mg | ORAL_TABLET | Freq: Four times a day (QID) | ORAL | Status: DC | PRN
  Administered 2022-07-10: 650 mg via ORAL
  Filled 2022-07-10: qty 2

## 2022-07-10 MED ORDER — HEPARIN SODIUM (PORCINE) 5000 UNIT/ML IJ SOLN
5000.0000 [IU] | Freq: Three times a day (TID) | INTRAMUSCULAR | Status: DC
  Administered 2022-07-10: 5000 [IU] via SUBCUTANEOUS
  Filled 2022-07-10: qty 1

## 2022-07-10 NOTE — Progress Notes (Signed)
Consultation Progress Note   Patient: [name] [id number] DOB: Dec 18, 1970 DOA: 07/09/2022 DOS: the patient was seen and examined on 07/10/2022 Primary service: DibiaElgie Collard, MD  Brief hospital course: KHRYSTIAN [name] is a 51 y.o. male with medical history significant of  ETOH abuse with severe withdrawal /seizures and history of intubation during withdrawal episode, bipolar d/o, anxiety, asthma , hx of cocaine abuse in remission x 4 months, OSA not compliant with cpap.PTSD, Hypertension who has interim history of  2  ED visitis 11/4, 11/5 seeking assistance for his ETOH abuse.  At that time patient was noted to be intoxicated without active withdrawal symptoms and was discharged with librium.  Of note on the 5th,patient presented to ED seeking prescription for librium but it appears patient went to wrong pharmacy s/p visit on 11/4. IN any event patient returns to ED seeking inpatient detox as he notes he is unable to detox as outpatient. He states he feels medications given not effective in  controlling his symptoms, so he returns to drinking. He states he has not eaten in the last 2 days due to continuous drinking. Patient states his last drink was 4 hours PTA. Of note ETOH level <10 on arrival. He notes no fever /chills/ n/v/ diarrhea/dysuria but does note increase GERD symptoms.   Assessment and Plan: ETOH abuse -hx of etoh w/d sz/ severe withdrawals requiring intubation -admit with withdrawal seeking medically supervised detox  - place on ciwa protocol  - mvi bag x 3  -mvi , thiamine/ folate  -SW to follow assist with discharge needs    Bipolar d/o Anxiety PTSD  -resume home regimen once med rec completed    Hypertension , Uncontrolled  -resume oral medications -prn clonidine    Hx of cocaine abuse -per patient in remission x 4 months - UDS pending    Asthma  -no acute exacerbation  -prn nebs    OSA -not compliant with cpap  -qhs O2       [facility] will continue  to follow the patient.  Subjective: Seen at bedside, last Ciwa score of 5, reports feeling h/a, anxiety and hungry.   Physical Exam: Constitutional-Alert, appears anxious Eyes: PERRL, lids and conjunctivae normal ENMT: Mucous membranes are moist.Normal dentition.  Neck: normal, supple, no masses, no thyromegaly Respiratory: clear to auscultation bilaterally, no wheezing, no crackles. Normal respiratory effort. No accessory muscle use.  Cardiovascular: Regular rate and rhythm, no murmurs / rubs / gallops. No extremity edema. Warm extremities  Abdomen: no tenderness, no masses palpated. No hepatosplenomegaly. Bowel sounds positive.  Musculoskeletal: no clubbing / cyanosis. No joint deformity upper and lower extremities. Good ROM, no contractures. Normal muscle tone.  Skin: no rashes, lesions, ulcers. No induration Neurologic: CN 2-12 grossly intact. Sensation intact, Strength 5/5 in all 4.  Psychiatric: Normal judgment and insight. Alert and oriented x 3.  Vitals:   07/10/22 0530 07/10/22 0600 07/10/22 0800 07/10/22 0909  BP: 130/84 (!) 142/85 118/79 (!) 149/114  Pulse: 76 74 73   Resp: 18 18 16    Temp:  98.2 F (36.8 C) 98.4 F (36.9 C)   TempSrc:   Oral   SpO2: 96% 96% 98%   Weight:      Height:        Data Reviewed:  There are no new results to review at this time.  Family Communication: none  Time spent: 15 minutes.  Author: , MD 07/10/2022 10:36 AM  For on call review [email or web address].

## 2022-07-10 NOTE — ED Notes (Signed)
Pt complaining of seeing things and being jittery.

## 2022-07-10 NOTE — ED Notes (Signed)
Pt requesting to leave AMA. States we are lying to him about when he will get a room and that he is getting no treatment. Explained to patient that he is receiving IV fluids, vitamins, and medications to help with alcohol withdrawal. Refused VS prior to leaving. Left AMA. MD aware.

## 2022-07-11 ENCOUNTER — Emergency Department (HOSPITAL_COMMUNITY)
Admission: EM | Admit: 2022-07-11 | Discharge: 2022-07-11 | Disposition: A | Discharge status: Home / Self Care | Payer: Medicare Other | Attending: Emergency Medicine | Admitting: Emergency Medicine

## 2022-07-11 ENCOUNTER — Encounter (HOSPITAL_COMMUNITY): Payer: Self-pay

## 2022-07-11 ENCOUNTER — Other Ambulatory Visit: Payer: Self-pay

## 2022-07-11 ENCOUNTER — Emergency Department (HOSPITAL_COMMUNITY): Payer: Medicare Other

## 2022-07-11 DIAGNOSIS — H1033 Unspecified acute conjunctivitis, bilateral: Secondary | ICD-10-CM | POA: Insufficient documentation

## 2022-07-11 DIAGNOSIS — R079 Chest pain, unspecified: Secondary | ICD-10-CM

## 2022-07-11 DIAGNOSIS — Z79899 Other long term (current) drug therapy: Secondary | ICD-10-CM | POA: Insufficient documentation

## 2022-07-11 DIAGNOSIS — F1410 Cocaine abuse, uncomplicated: Secondary | ICD-10-CM | POA: Diagnosis not present

## 2022-07-11 DIAGNOSIS — R002 Palpitations: Secondary | ICD-10-CM | POA: Insufficient documentation

## 2022-07-11 LAB — COMPREHENSIVE METABOLIC PANEL
ALT: 14 U/L (ref 0–44)
AST: 26 U/L (ref 15–41)
Albumin: 3.7 g/dL (ref 3.5–5.0)
Alkaline Phosphatase: 92 U/L (ref 38–126)
Anion gap: 11 (ref 5–15)
BUN: 9 mg/dL (ref 6–20)
CO2: 25 mmol/L (ref 22–32)
Calcium: 8.4 mg/dL — ABNORMAL LOW (ref 8.9–10.3)
Chloride: 104 mmol/L (ref 98–111)
Creatinine, Ser: 1.01 mg/dL (ref 0.61–1.24)
GFR, Estimated: >60 mL/min (ref >60)
Glucose, Bld: 79 mg/dL (ref 70–99)
Potassium: 3.4 mmol/L — ABNORMAL LOW (ref 3.5–5.1)
Sodium: 140 mmol/L (ref 135–145)
Total Bilirubin: 0.9 mg/dL (ref 0.3–1.2)
Total Protein: 6.9 g/dL (ref 6.5–8.1)

## 2022-07-11 LAB — CBC WITH DIFFERENTIAL/PLATELET
Abs Immature Granulocytes: 0.04 10*3/uL (ref 0.00–0.07)
Basophils Absolute: 0 10*3/uL (ref 0.0–0.1)
Basophils Relative: 1 %
Eosinophils Absolute: 0.1 10*3/uL (ref 0.0–0.5)
Eosinophils Relative: 1 %
HCT: 40.2 % (ref 39.0–52.0)
Hemoglobin: 13.2 g/dL (ref 13.0–17.0)
Immature Granulocytes: 1 %
Lymphocytes Relative: 23 %
Lymphs Abs: 1.8 10*3/uL (ref 0.7–4.0)
MCH: 27.7 pg (ref 26.0–34.0)
MCHC: 32.8 g/dL (ref 30.0–36.0)
MCV: 84.5 fL (ref 80.0–100.0)
Monocytes Absolute: 0.7 10*3/uL (ref 0.1–1.0)
Monocytes Relative: 8 %
Neutro Abs: 5.4 10*3/uL (ref 1.7–7.7)
Neutrophils Relative %: 66 %
Platelets: 254 10*3/uL (ref 150–400)
RBC: 4.76 MIL/uL (ref 4.22–5.81)
RDW: 14.7 % (ref 11.5–15.5)
WBC: 8.1 10*3/uL (ref 4.0–10.5)
nRBC: 0 % (ref 0.0–0.2)

## 2022-07-11 LAB — RAPID URINE DRUG SCREEN, HOSP PERFORMED
Amphetamines: NOT DETECTED
Barbiturates: NOT DETECTED
Benzodiazepines: POSITIVE — AB
Cocaine: POSITIVE — AB
Opiates: NOT DETECTED
Tetrahydrocannabinol: NOT DETECTED

## 2022-07-11 LAB — TROPONIN I (HIGH SENSITIVITY)
Troponin I (High Sensitivity): 11 ng/L (ref <18)
Troponin I (High Sensitivity): 9 ng/L (ref <18)

## 2022-07-11 LAB — ETHANOL: Alcohol, Ethyl (B): <10 mg/dL (ref <10)

## 2022-07-11 NOTE — Discharge Instructions (Addendum)
All the blood work today looked normal.  When you use cocaine that can make your heart race and can also give you chest pain.  Would be a good idea to stop using this.

## 2022-07-11 NOTE — ED Provider Triage Note (Signed)
Emergency Medicine Provider Triage Evaluation Note  HARLEN DANFORD , a 51 y.o. male  was evaluated in triage.  Pt picked up from [facility] with complaints of chest pain, worse with palpation and movement.  EtOH and cocaine on board, last use 2 hours ago.  Ongoing issues with same over the past 4 months.  Review of Systems  Positive: Chest pain, EtOH, cocaine Negative: fever  Physical Exam  BP 109/84 (BP Location: Right Arm)   Pulse 85   Temp 98.1 F (36.7 C) (Oral)   Resp 16   SpO2 96%   Gen:   Awake, no distress   Resp:  Normal effort  MSK:   Moves extremities without difficulty  Other:    Medical Decision Making  Medically screening exam initiated at 4:13 AM.  Appropriate orders placed.  MIKLE STERNBERG was informed that the remainder of the evaluation will be completed by another provider, this initial triage assessment does not replace that evaluation, and the importance of remaining in the ED until their evaluation is complete.  Chest pain, EtOH and cocaine on board.  EKG, labs, CXR.   Garlon Hatchet, PA-C 07/11/22 0425

## 2022-07-11 NOTE — ED Provider Notes (Signed)
[facility] EMERGENCY DEPARTMENT Provider Note   CSN: [id number] Arrival date & time: 07/11/22  0413     History  Chief Complaint  Patient presents with   Chest Pain    [name] is a 51 y.o. male.  Patient is a 51 year old male with a history of bipolar disease, heart palpitations with severe anxiety, prior atrial fibrillation not on anticoagulants, alcohol abuse, cocaine abuse who is presenting today from the [facility] due to complaints of chest pain and palpitations.  Patient reported to triage that he had used cocaine around 2 AM was feeling his heart racing and having pain.  They noted him to be under the influence upon arrival to the emergency room.  Currently patient is in the room but is very sleepy.  He will wake up but falls asleep during midsentence.  He mumbles about having chest pain or feeling like his heart is racing but will not give further details.  He does not appear to be short of breath at this time.  He does admit to using cocaine last night and mumbles something about drinking.  He also reports he received Librium because he was going to try to stop drinking.  No further history is able to be obtained at this time.  The history is provided by the patient and medical records.  Chest Pain      Home Medications Prior to Admission medications   Medication Sig Start Date End Date Taking? Authorizing Provider  amLODipine (NORVASC) 5 MG tablet Take 1 tablet (5 mg total) by mouth daily. 09/05/20   Davonna Belling, MD  calcium carbonate (OSCAL) 1500 (600 Ca) MG TABS tablet Take 1 tablet (1,500 mg total) by mouth 2 (two) times daily with a meal for 3 days. Patient not taking: Reported on 12/29/2021 12/21/21 12/29/21  Sherwood Gambler, MD  chlordiazePOXIDE (LIBRIUM) 25 MG capsule 50mg  PO TID x 1D, then 25-50mg  PO BID X 1D, then 25-50mg  PO QD X 1D Patient not taking: Reported on 07/09/2022 07/06/22   Maudie Flakes, MD  cloNIDine (CATAPRES) 0.2 MG tablet Take  1 tablet (0.2 mg total) by mouth 3 (three) times daily as needed. Patient not taking: Reported on 07/09/2022 09/05/20   Davonna Belling, MD  doxepin (SINEQUAN) 10 MG capsule Take 1 capsule (10 mg total) by mouth at bedtime. Patient not taking: Reported on 07/09/2022 09/05/20   Davonna Belling, MD  nirmatrelvir & ritonavir (PAXLOVID, 300/100,) 20 x 150 MG & 10 x 100MG  TBPK Take 3 tablets by mouth twice daily. Patient not taking: Reported on 12/29/2021 10/02/21   Juanito Doom, MD  potassium chloride SA (KLOR-CON M) 20 MEQ tablet Take 2 tablets (40 mEq total) by mouth 2 (two) times daily for 5 days. Patient not taking: Reported on 12/29/2021 12/21/21 12/29/21  Sherwood Gambler, MD  potassium chloride SA (KLOR-CON M) 20 MEQ tablet Take 1 tablet (20 mEq total) by mouth 2 (two) times daily for 3 days. 12/24/21 12/27/21  Jeanell Sparrow, DO  sucralfate (CARAFATE) 1 g tablet Take 1 tablet (1 g total) by mouth 4 (four) times daily before meals and at bedtime for 7 days. Patient not taking: Reported on 12/29/2021 12/24/21 12/31/21  Wynona Dove A, DO      Allergies    Metoprolol    Review of Systems   Review of Systems  Cardiovascular:  Positive for chest pain.    Physical Exam Updated Vital Signs BP 134/88   Pulse 75   Temp  98.1 F (36.7 C) (Oral)   Resp 16   Ht 5\' 7"  (1.702 m)   Wt 120 kg   SpO2 97%   BMI 41.43 kg/m  Physical Exam Vitals and nursing note reviewed.  Constitutional:      General: He is not in acute distress.    Appearance: He is well-developed.     Comments: Sleepy but will wake up to voice briefly but falls asleep in midsentence  HENT:     Head: Normocephalic and atraumatic.     Mouth/Throat:     Mouth: Mucous membranes are moist.  Eyes:     Pupils: Pupils are equal, round, and reactive to light.     Comments: Bilateral conjunctive are injected  Cardiovascular:     Rate and Rhythm: Normal rate and regular rhythm.     Heart sounds: No murmur heard. Pulmonary:      Effort: Pulmonary effort is normal. No respiratory distress.     Breath sounds: Normal breath sounds. No wheezing or rales.  Abdominal:     General: There is no distension.     Palpations: Abdomen is soft.     Tenderness: There is no abdominal tenderness. There is no guarding or rebound.  Musculoskeletal:        General: No tenderness. Normal range of motion.     Cervical back: Normal range of motion and neck supple.     Right lower leg: No edema.     Left lower leg: No edema.  Skin:    General: Skin is warm and dry.     Findings: No erythema or rash.  Neurological:     Mental Status: He is oriented to person, place, and time.     Sensory: No sensory deficit.     Motor: No weakness.     ED Results / Procedures / Treatments   Labs (all labs ordered are listed, but only abnormal results are displayed) Labs Reviewed  COMPREHENSIVE METABOLIC PANEL - Abnormal; Notable for the following components:      Result Value   Potassium 3.4 (*)    Calcium 8.4 (*)    All other components within normal limits  RAPID URINE DRUG SCREEN, HOSP PERFORMED - Abnormal; Notable for the following components:   Cocaine POSITIVE (*)    Benzodiazepines POSITIVE (*)    All other components within normal limits  CBC WITH DIFFERENTIAL/PLATELET  ETHANOL  TROPONIN I (HIGH SENSITIVITY)  TROPONIN I (HIGH SENSITIVITY)    EKG EKG Interpretation  Date/Time:  Friday July 11 2022 04:42:29 EST Ventricular Rate:  85 PR Interval:  174 QRS Duration: 96 QT Interval:  404 QTC Calculation: 480 R Axis:   -7 Text Interpretation: Normal sinus rhythm Septal infarct , age undetermined Abnormal ECG When compared with ECG of 09-Jul-2022 18:43, Premature ventricular complexes are no longer present Confirmed by 11-Jul-2022 (Dione Booze) on 07/11/2022 5:15:27 AM  Radiology DG Chest 2 View  Result Date: 07/11/2022 CLINICAL DATA:  Chest pain. EXAM: CHEST - 2 VIEW COMPARISON:  07/05/2022. FINDINGS: The heart is borderline  enlarged and the mediastinal contour is stable. The pulmonary vasculature is prominent and interstitial opacities are noted bilaterally. No consolidation, effusion, or pneumothorax. Mild degenerative changes in the thoracic spine. No acute osseous abnormality. IMPRESSION: Borderline cardiomegaly with prominent pulmonary vasculature and interstitial prominence, possible mild edema. Electronically Signed   By: 13/11/2021 M.D.   On: 07/11/2022 04:58    Procedures Procedures    Medications Ordered in ED Medications - No  data to display  ED Course/ Medical Decision Making/ A&P                           Medical Decision Making Amount and/or Complexity of Data Reviewed Independent Historian: EMS External Data Reviewed: notes. Labs: ordered. Decision-making details documented in ED Course. Radiology: ordered and independent interpretation performed. Decision-making details documented in ED Course. ECG/medicine tests: ordered and independent interpretation performed. Decision-making details documented in ED Course.   Pt with multiple medical problems and comorbidities and presenting today with a complaint that caries a high risk for morbidity and mortality.  Here today with complaint of chest pain.  Unable to get a good history from the patient because he continually falls asleep.  May be had palpitations and chest pain after using cocaine earlier this morning.  Patient reports he had been drinking however his alcohol level is less than 10.  He was given Librium 2 days ago as he wanted to stop drinking.  I independently interpreted patient's EKG and labs.  EKG today shows no evidence of ST changes or findings concerning for ACS.  Troponin x2 was negative, drug screen positive for cocaine and benzodiazepines, alcohol is negative, CBC and CMP without acute findings.I have independently visualized and interpreted pt's images today.  Chest x-ray today without acute findings.  Patient is not in distress at  this time.  Sats are 97% on room air and he is not tachypneic and does not have any evidence concerning for fluid overload at this time.  Patient symptoms may be result of cocaine use earlier.  He is very somnolent however unclear if he took a Librium and that is what is making him tired.  Low suspicion for intracranial pathology.  Will ensure patient's mental status is back to baseline he is more alert but do not have any admission criteria at this time.  Otherwise feel that patient will be stable for discharge home.  10:00 AM On reevaluation patient is now more awake.  Feel that he is stable for discharge home.          Final Clinical Impression(s) / ED Diagnoses Final diagnoses:  Nonspecific chest pain  Cocaine abuse [facility])    Rx / DC Orders ED Discharge Orders     None         Blanchie Dessert, MD 07/11/22 1000

## 2022-07-11 NOTE — ED Triage Notes (Signed)
[facility] [place] c/o anterior chest wall pain that has been ongoing for 40 mins. Pt endorses ETOH and cocaine use 2 hrs ago.

## 2022-07-13 ENCOUNTER — Emergency Department (HOSPITAL_COMMUNITY): Payer: Medicare Other

## 2022-07-13 ENCOUNTER — Emergency Department (HOSPITAL_COMMUNITY)
Admission: EM | Admit: 2022-07-13 | Discharge: 2022-07-14 | Disposition: A | Discharge status: Home / Self Care | Payer: Medicare Other | Attending: Emergency Medicine | Admitting: Emergency Medicine

## 2022-07-13 ENCOUNTER — Other Ambulatory Visit: Payer: Self-pay

## 2022-07-13 DIAGNOSIS — R002 Palpitations: Secondary | ICD-10-CM | POA: Insufficient documentation

## 2022-07-13 DIAGNOSIS — R0602 Shortness of breath: Secondary | ICD-10-CM | POA: Diagnosis not present

## 2022-07-13 DIAGNOSIS — Z1152 Encounter for screening for COVID-19: Secondary | ICD-10-CM | POA: Diagnosis not present

## 2022-07-13 DIAGNOSIS — R079 Chest pain, unspecified: Secondary | ICD-10-CM | POA: Diagnosis present

## 2022-07-13 DIAGNOSIS — Z79899 Other long term (current) drug therapy: Secondary | ICD-10-CM | POA: Insufficient documentation

## 2022-07-13 DIAGNOSIS — I10 Essential (primary) hypertension: Secondary | ICD-10-CM | POA: Diagnosis not present

## 2022-07-13 LAB — CBC WITH DIFFERENTIAL/PLATELET
Abs Immature Granulocytes: 0.03 10*3/uL (ref 0.00–0.07)
Basophils Absolute: 0 10*3/uL (ref 0.0–0.1)
Basophils Relative: 0 %
Eosinophils Absolute: 0.2 10*3/uL (ref 0.0–0.5)
Eosinophils Relative: 3 %
HCT: 41.7 % (ref 39.0–52.0)
Hemoglobin: 13.3 g/dL (ref 13.0–17.0)
Immature Granulocytes: 1 %
Lymphocytes Relative: 23 %
Lymphs Abs: 1.3 10*3/uL (ref 0.7–4.0)
MCH: 27.4 pg (ref 26.0–34.0)
MCHC: 31.9 g/dL (ref 30.0–36.0)
MCV: 86 fL (ref 80.0–100.0)
Monocytes Absolute: 0.5 10*3/uL (ref 0.1–1.0)
Monocytes Relative: 9 %
Neutro Abs: 3.7 10*3/uL (ref 1.7–7.7)
Neutrophils Relative %: 64 %
Platelets: 236 10*3/uL (ref 150–400)
RBC: 4.85 MIL/uL (ref 4.22–5.81)
RDW: 14.9 % (ref 11.5–15.5)
WBC: 5.7 10*3/uL (ref 4.0–10.5)
nRBC: 0 % (ref 0.0–0.2)

## 2022-07-13 LAB — BASIC METABOLIC PANEL
Anion gap: 14 (ref 5–15)
BUN: 7 mg/dL (ref 6–20)
CO2: 23 mmol/L (ref 22–32)
Calcium: 8.5 mg/dL — ABNORMAL LOW (ref 8.9–10.3)
Chloride: 104 mmol/L (ref 98–111)
Creatinine, Ser: 0.86 mg/dL (ref 0.61–1.24)
GFR, Estimated: >60 mL/min (ref >60)
Glucose, Bld: 71 mg/dL (ref 70–99)
Potassium: 3.3 mmol/L — ABNORMAL LOW (ref 3.5–5.1)
Sodium: 141 mmol/L (ref 135–145)

## 2022-07-13 LAB — TROPONIN I (HIGH SENSITIVITY): Troponin I (High Sensitivity): 8 ng/L (ref <18)

## 2022-07-13 LAB — BRAIN NATRIURETIC PEPTIDE: B Natriuretic Peptide: 10.5 pg/mL (ref 0.0–100.0)

## 2022-07-13 NOTE — ED Provider Triage Note (Signed)
Emergency Medicine Provider Triage Evaluation Note  VOLLIE BRUNTY , a 51 y.o. male  was evaluated in triage.  Pt complains of chest pain, shortness of breath.  Has been ongoing for a month.  Some increase in lower extremity swelling.  States he has history of CHF however is not on any diuretics.  Review of Systems  Positive: CP, SOB, LE swelling Negative: Fever, cough  Physical Exam  BP (!) 107/90 (BP Location: Right Arm)   Pulse 87   Temp 98.1 F (36.7 C) (Oral)   Resp 16   SpO2 97%  Gen:   Awake, no distress   Resp:  Normal effort  MSK:   Moves extremities without difficulty  Other:    Medical Decision Making  Medically screening exam initiated at 4:03 PM.  Appropriate orders placed.  JAYVON MOUNGER was informed that the remainder of the evaluation will be completed by another provider, this initial triage assessment does not replace that evaluation, and the importance of remaining in the ED until their evaluation is complete.  CP, SOB, le swelling   Jaylend Reiland A, PA-C 07/13/22 1604

## 2022-07-13 NOTE — ED Triage Notes (Signed)
Pt bib ems with central chest pain, non radiating, worsening. Given 324mg  aspirin. VSS with ems.  120/94 HR 92 RR 20 97% 20g RAC

## 2022-07-14 ENCOUNTER — Encounter (HOSPITAL_COMMUNITY): Payer: Self-pay

## 2022-07-14 DIAGNOSIS — R079 Chest pain, unspecified: Secondary | ICD-10-CM | POA: Diagnosis not present

## 2022-07-14 LAB — RESP PANEL BY RT-PCR (FLU A&B, COVID) ARPGX2
Influenza A by PCR: NEGATIVE
Influenza B by PCR: NEGATIVE
SARS Coronavirus 2 by RT PCR: NEGATIVE

## 2022-07-14 LAB — D-DIMER, QUANTITATIVE: D-Dimer, Quant: 0.38 ug/mL-FEU (ref 0.00–0.50)

## 2022-07-14 LAB — URINALYSIS, ROUTINE W REFLEX MICROSCOPIC
Bilirubin Urine: NEGATIVE
Glucose, UA: NEGATIVE mg/dL
Hgb urine dipstick: NEGATIVE
Ketones, ur: 80 mg/dL — AB
Leukocytes,Ua: NEGATIVE
Nitrite: NEGATIVE
Protein, ur: 30 mg/dL — AB
Specific Gravity, Urine: 1.028 (ref 1.005–1.030)
pH: 5 (ref 5.0–8.0)

## 2022-07-14 LAB — MAGNESIUM: Magnesium: 1.7 mg/dL (ref 1.7–2.4)

## 2022-07-14 LAB — TROPONIN I (HIGH SENSITIVITY): Troponin I (High Sensitivity): 4 ng/L (ref <18)

## 2022-07-14 MED ORDER — ACETAMINOPHEN 500 MG PO TABS
1000.0000 mg | ORAL_TABLET | Freq: Once | ORAL | Status: AC
  Administered 2022-07-14: 1000 mg via ORAL
  Filled 2022-07-14: qty 2

## 2022-07-14 MED ORDER — KETOROLAC TROMETHAMINE 30 MG/ML IJ SOLN
30.0000 mg | Freq: Once | INTRAMUSCULAR | Status: AC
  Administered 2022-07-14: 30 mg via INTRAVENOUS
  Filled 2022-07-14: qty 1

## 2022-07-14 MED ORDER — POTASSIUM CHLORIDE CRYS ER 20 MEQ PO TBCR
20.0000 meq | EXTENDED_RELEASE_TABLET | Freq: Once | ORAL | Status: AC
  Administered 2022-07-14: 20 meq via ORAL
  Filled 2022-07-14: qty 1

## 2022-07-14 NOTE — ED Notes (Addendum)
Pt asking for drink and sandwich

## 2022-07-14 NOTE — ED Provider Notes (Signed)
[facility] EMERGENCY DEPARTMENT Provider Note   CSN: [id number] Arrival date & time: 07/13/22  1545     History {Add pertinent medical, surgical, social history, OB history to HPI:1} Chief Complaint  Patient presents with  . Chest Pain    [name] is a 51 y.o. male with hypertension, obesity, alcohol use disorder, bipolar 1 disorder, polysubstance abuse, MDD presents with chest pain.   EMS gave aspirin.   Patient was seen 3 days ago on 07/11/22 at this department for chest pain and palpitations from the [facility].  Had used cocaine prior to arrival.  Was also seen 11/4, 11/5, 11/8, as well for alcohol withdrawal and subsequently intoxication.   Patient reports a constellation of symptoms including generalized weakness, decreased urinary output, chest tightness, palpitations that starts on left and moves to right increased with exertion. Coughed up some "black stuff" the other night which hasn't happened for a long time. Endorses nausea w/ no vomiting. Endorses "forgetfulness." Endorses slightly red urine and wonders if there is blood in it. He denies using cocaine except for the one time a couple of days ago. States he had covid-19 earlier in the year and that he only has a t-shirt to walk around in right now so he is cold a lot of the time. Has talked with social services already here in the waiting room. He does not have a cardiologist that he sees. States his last alcoholic drink was 4 days ago.  Also endorses 7 years of bilateral LEE. Denies SOB at rest but states he does have some dyspnea with a lot of walking.   Chest Pain      Home Medications Prior to Admission medications   Medication Sig Start Date End Date Taking? Authorizing Provider  amLODipine (NORVASC) 5 MG tablet Take 1 tablet (5 mg total) by mouth daily. 09/05/20   Davonna Belling, MD  calcium carbonate (OSCAL) 1500 (600 Ca) MG TABS tablet Take 1 tablet (1,500 mg total) by mouth 2 (two) times  daily with a meal for 3 days. Patient not taking: Reported on 12/29/2021 12/21/21 12/29/21  Sherwood Gambler, MD  chlordiazePOXIDE (LIBRIUM) 25 MG capsule 50mg  PO TID x 1D, then 25-50mg  PO BID X 1D, then 25-50mg  PO QD X 1D Patient not taking: Reported on 07/09/2022 07/06/22   Maudie Flakes, MD  cloNIDine (CATAPRES) 0.2 MG tablet Take 1 tablet (0.2 mg total) by mouth 3 (three) times daily as needed. Patient not taking: Reported on 07/09/2022 09/05/20   Davonna Belling, MD  doxepin (SINEQUAN) 10 MG capsule Take 1 capsule (10 mg total) by mouth at bedtime. Patient not taking: Reported on 07/09/2022 09/05/20   Davonna Belling, MD  nirmatrelvir & ritonavir (PAXLOVID, 300/100,) 20 x 150 MG & 10 x 100MG  TBPK Take 3 tablets by mouth twice daily. Patient not taking: Reported on 12/29/2021 10/02/21   Juanito Doom, MD  potassium chloride SA (KLOR-CON M) 20 MEQ tablet Take 2 tablets (40 mEq total) by mouth 2 (two) times daily for 5 days. Patient not taking: Reported on 12/29/2021 12/21/21 12/29/21  Sherwood Gambler, MD  potassium chloride SA (KLOR-CON M) 20 MEQ tablet Take 1 tablet (20 mEq total) by mouth 2 (two) times daily for 3 days. 12/24/21 12/27/21  Jeanell Sparrow, DO  sucralfate (CARAFATE) 1 g tablet Take 1 tablet (1 g total) by mouth 4 (four) times daily before meals and at bedtime for 7 days. Patient not taking: Reported on 12/29/2021 12/24/21 12/31/21  Pearline Cables,  Samuel A, DO      Allergies    Metoprolol    Review of Systems   Review of Systems  Cardiovascular:  Positive for chest pain.   Review of systems positive/negative for ***.  A 10 point review of systems was performed and is negative unless otherwise reported in HPI.  Physical Exam Updated Vital Signs BP (!) 147/81   Pulse 75   Temp 97.9 F (36.6 C) (Oral)   Resp 18   SpO2 99%  Physical Exam General: Normal appearing male, lying in bed.  HEENT: PERRLA, Sclera anicteric, MMM, trachea midline. Cardiology: RRR, no murmurs/rubs/gallops. BL  radial and DP pulses equal bilaterally.  Resp: Normal respiratory rate and effort. CTAB, no wheezes, rhonchi, crackles.  Abd: Soft, non-tender, non-distended. No rebound tenderness or guarding.  MSK: No peripheral edema or signs of trauma. Extremities without deformity or TTP. No cyanosis or clubbing. Skin: warm, dry. No rashes or lesions. Back: No CVA tenderness Neuro: A&Ox4, CNs II-XII grossly intact. MAEs. Sensation grossly intact.  Psych: Normal mood and affect.   ED Results / Procedures / Treatments   Labs (all labs ordered are listed, but only abnormal results are displayed) Labs Reviewed  BASIC METABOLIC PANEL - Abnormal; Notable for the following components:      Result Value   Potassium 3.3 (*)    Calcium 8.5 (*)    All other components within normal limits  RESP PANEL BY RT-PCR (FLU A&B, COVID) ARPGX2  CBC WITH DIFFERENTIAL/PLATELET  BRAIN NATRIURETIC PEPTIDE  MAGNESIUM  URINALYSIS, ROUTINE W REFLEX MICROSCOPIC  TROPONIN I (HIGH SENSITIVITY)  TROPONIN I (HIGH SENSITIVITY)    EKG EKG Interpretation  Date/Time:  Sunday July 13 2022 15:53:11 EST Ventricular Rate:  87 PR Interval:  164 QRS Duration: 94 QT Interval:  378 QTC Calculation: 454 R Axis:   -8 Text Interpretation: Normal sinus rhythm Septal infarct , age undetermined Abnormal ECG When compared with ECG of 11-Jul-2022 04:42, Similar to previous EKGs Confirmed by Cindee Lame [phone]) on 07/14/2022 8:06:21 AM  Radiology DG Chest 2 View  Result Date: 07/13/2022 CLINICAL DATA:  Right-sided chest pain, lightheadedness, dizziness EXAM: CHEST - 2 VIEW COMPARISON:  07/11/2022 FINDINGS: Frontal and lateral views of the chest demonstrate a stable cardiac silhouette. Continued ectasia of the thoracic aorta. The lungs are hyperinflated with background emphysema unchanged. No airspace disease, effusion, or pneumothorax. No acute bony abnormalities. IMPRESSION: 1. Stable emphysema.  No acute airspace disease.  Electronically Signed   By: Randa Ngo M.D.   On: 07/13/2022 16:54    Procedures Procedures  {Document cardiac monitor, telemetry assessment procedure when appropriate:1}  Medications Ordered in ED Medications  potassium chloride SA (KLOR-CON M) CR tablet 20 mEq (has no administration in time range)    ED Course/ Medical Decision Making/ A&P                          Medical Decision Making Amount and/or Complexity of Data Reviewed Labs: ordered. Decision-making details documented in ED Course.  Risk OTC drugs. Prescription drug management.    Patient is overall well-appearing, HDS.   DDX for chest pain includes but is not limited to:  ACS/arrhythmia,  PE, aortic dissection, PNA, PTX, esophogeal rupture, biliary disease, cardiac tamponade, pericarditis, GERD/PUD/gastritis, or musculoskeletal pain. Very low suspicion for ACS vs aortic dissection given presenting sx. Patient cannot PERC out based on tachycardia, but will obtain D dimer and reassess, minimal risk factors for PE. No  c/f dissection. No abdominal pain and no c/f biliary disease. Consider GERD. ***    Labs demonstrate K 3.3, normal renal function, troponin 8-4, BNP 10.5, no leukocytosis or anemia.  Chest x-ray demonstrates stable emphysema without any pulmonary edema/pleural effusion. EKG with no arrhythmia or ischemic signs.    Consider cocaine-induced coronary vasoconstriction - seems like patient has presented multiple times now for CP after cocaine use.   Heart score ***.   He was observed for unfortunately 16 hours in the waiting room and remained stable the entire time. Considered admission for this patient but do not believe it is necessary at this time, believe he is stable for o/p f/u.    I have personally reviewed and interpreted all labs and imaging.   Clinical Course as of 08/02/22 0825  Mon Jul 14, 2022  1145 Urinalysis, Routine w reflex microscopic(!) No UTI, no hematuria, + ketones [HN]  1226  D-Dimer, Quant: 0.38 [HN]    Clinical Course User Index [HN] Loetta Rough, MD    {Document critical care time when appropriate:1} {Document review of labs and clinical decision tools ie heart score, Chads2Vasc2 etc:1}  {Document your independent review of radiology images, and any outside records:1} {Document your discussion with family members, caretakers, and with consultants:1} {Document social determinants of health affecting pt's care:1} {Document your decision making why or why not admission, treatments were needed:1} Final Clinical Impression(s) / ED Diagnoses Final diagnoses:  Chest pain, unspecified type  Palpitations    Rx / DC Orders ED Discharge Orders          Ordered    Ambulatory referral to Cardiology       Comments: If you have not heard from the Cardiology office within the next 72 hours please call [phone].   07/14/22 1214             This note was created using dictation software, which may contain spelling or grammatical errors.

## 2022-07-14 NOTE — Discharge Instructions (Addendum)
Thank you for coming to [facility] Emergency Department. You were seen for chest pain, palpitations, generalized weakness, decreased urine output. We did an exam, labs, and imaging, and these showed mildly low potassium which was repleted.  You can alternate taking Tylenol and ibuprofen as needed for pain. You can take 650mg  tylenol (acetaminophen) every 4-6 hours, and 600 mg ibuprofen 3 times a day for a week. Please follow up with your primary care provider within 1 week. You can call the [facility] number to establish with a primary care physician, please call them this afternoon.   Do not hesitate to return to the ED or call 911 if you experience: -Worsening symptoms -Lightheadedness, passing out -Fevers/chills -Anything else that concerns you

## 2022-07-14 NOTE — ED Notes (Signed)
Pt given 2 sandwiches and 3 ginger ales. Instructed to hit call light when UA ready

## 2022-07-16 NOTE — Progress Notes (Signed)
CSW received call from Maple Hudson at [facility] who requested information on patient. CSW informed Dorene Sorrow that patient was discharged on 07/14/2022.  Edwin Dada, MSW, LCSW Transitions of Care  Clinical Social Worker II [phone]

## 2022-08-08 ENCOUNTER — Encounter: Payer: Self-pay | Admitting: Interventional Cardiology

## 2022-08-08 ENCOUNTER — Ambulatory Visit: Payer: Medicare Other | Attending: Interventional Cardiology | Admitting: Interventional Cardiology

## 2022-08-08 VITALS — BP 148/100 | HR 68 | Ht 67.0 in | Wt 254.8 lb

## 2022-08-08 DIAGNOSIS — R072 Precordial pain: Secondary | ICD-10-CM | POA: Diagnosis not present

## 2022-08-08 DIAGNOSIS — I10 Essential (primary) hypertension: Secondary | ICD-10-CM

## 2022-08-08 MED ORDER — AMLODIPINE BESYLATE 5 MG PO TABS: 5.0000 mg | ORAL_TABLET | Freq: Every day | ORAL | 3 refills | Status: DC

## 2022-08-08 NOTE — Progress Notes (Signed)
Cardiology Office Note   Date:  08/08/2022   ID:  SCORPIO FORTIN, DOB 1971-02-22, MRN [id number]  PCP:  Pcp, No    No chief complaint on file.  Chest pain  Wt Readings from Last 3 Encounters:  08/08/22 254 lb 12.8 oz (115.6 kg)  07/11/22 264 lb 8.8 oz (120 kg)  07/09/22 263 lb (119.3 kg)       History of Present Illness: [name] is a 51 y.o. male who is being seen today for the evaluation of chest pain at the request of Loetta Rough, MD.   He was seen in the emergency room last month with the following records.  He "is a 51 y.o. male with hypertension, obesity, alcohol use disorder, bipolar 1 disorder, polysubstance abuse, MDD presents with chest pain.    EMS gave aspirin.    Patient was seen 3 days ago on 07/11/22 at this department for chest pain and palpitations from the [facility].  Had used cocaine prior to arrival.  Was also seen 11/4, 11/5, 11/8, as well for alcohol withdrawal and subsequently intoxication.    Patient reports a constellation of symptoms including generalized weakness, decreased urinary output, chest tightness, palpitations that starts on left and moves to right increased with exertion. Coughed up some "black stuff" the other night which hasn't happened for a long time. Endorses nausea w/ no vomiting. Endorses "forgetfulness." Endorses slightly red urine and wonders if there is blood in it. He denies using cocaine except for the one time a couple of days ago. States he had covid-19 earlier in the year and that he only has a t-shirt to walk around in right now so he is cold a lot of the time."  Most of the sx are palpitations, when he gets stressed from his difficult social situation.   Diagnosed with HTN in 2017.  Has been on and of meds due to scattered medical care.  He lost 30-50lbs over the past several years.   Most recently, he was on amlodipine.  Last time he took it was 6 months ago.    Denies : exertional Chest pain. Dizziness. Leg edema.  Nitroglycerin use. Orthopnea. Paroxysmal nocturnal dyspnea. Shortness of breath. Syncope.    Past Medical History:  Diagnosis Date   Alcohol dependence with withdrawal (HCC)    Anxiety    Arrhythmia    Asthma    Atrial fibrillation (HCC)    not on AC   Bipolar 1 disorder (HCC)    Class 2 obesity    Cocaine abuse (HCC)    Depression    Heart palpitations    with severe anxiety   Hyperlipidemia    Hypertension    PTSD (post-traumatic stress disorder)    PTSD (post-traumatic stress disorder)    Syncope and collapse     Past Surgical History:  Procedure Laterality Date   NO PAST SURGERIES       Current Outpatient Medications  Medication Sig Dispense Refill   amLODipine (NORVASC) 5 MG tablet Take 1 tablet (5 mg total) by mouth daily. (Patient not taking: Reported on 08/08/2022) 30 tablet 0   calcium carbonate (OSCAL) 1500 (600 Ca) MG TABS tablet Take 1 tablet (1,500 mg total) by mouth 2 (two) times daily with a meal for 3 days. (Patient not taking: Reported on 12/29/2021) 6 tablet 0   chlordiazePOXIDE (LIBRIUM) 25 MG capsule 50mg  PO TID x 1D, then 25-50mg  PO BID X 1D, then 25-50mg  PO QD X 1D (Patient  not taking: Reported on 08/08/2022) 10 capsule 0   cloNIDine (CATAPRES) 0.2 MG tablet Take 1 tablet (0.2 mg total) by mouth 3 (three) times daily as needed. (Patient not taking: Reported on 08/08/2022) 12 tablet 0   doxepin (SINEQUAN) 10 MG capsule Take 1 capsule (10 mg total) by mouth at bedtime. (Patient not taking: Reported on 08/08/2022) 30 capsule 0   nirmatrelvir & ritonavir (PAXLOVID, 300/100,) 20 x 150 MG & 10 x 100MG  TBPK Take 3 tablets by mouth twice daily. (Patient not taking: Reported on 08/08/2022) 30 tablet 0   potassium chloride SA (KLOR-CON M) 20 MEQ tablet Take 2 tablets (40 mEq total) by mouth 2 (two) times daily for 5 days. (Patient not taking: Reported on 12/29/2021) 20 tablet 0   potassium chloride SA (KLOR-CON M) 20 MEQ tablet Take 1 tablet (20 mEq total) by mouth 2 (two)  times daily for 3 days. 6 tablet 0   sucralfate (CARAFATE) 1 g tablet Take 1 tablet (1 g total) by mouth 4 (four) times daily before meals and at bedtime for 7 days. (Patient not taking: Reported on 12/29/2021) 28 tablet 0   No current facility-administered medications for this visit.    Allergies:   Metoprolol    Social History:  The patient  reports that he has never smoked. He has been exposed to tobacco smoke. He has never used smokeless tobacco. He reports that he does not currently use alcohol. He reports that he does not currently use drugs after having used the following drugs: Cocaine.   Family History:  The patient's family history includes Alcohol abuse in his maternal grandfather and mother; Heart disease in his mother; Hypertension in his father.    ROS:  Please see the history of present illness.   Otherwise, review of systems are positive for stress.   All other systems are reviewed and negative.    PHYSICAL EXAM: VS:  BP (!) 148/100   Pulse 68   Ht 5\' 7"  (1.702 m)   Wt 254 lb 12.8 oz (115.6 kg)   SpO2 96%   BMI 39.91 kg/m  , BMI Body mass index is 39.91 kg/m. GEN: Well nourished, well developed, in no acute distress HEENT: normal Neck: no JVD, carotid bruits, or masses Cardiac: RRR; no murmurs, rubs, or gallops,no edema  Respiratory:  clear to auscultation bilaterally, normal work of breathing GI: soft, nontender, nondistended, + BS MS: no deformity or atrophy Skin: warm and dry, no rash Neuro:  Strength and sensation are intact Psych: euthymic mood, full affect   EKG:   The ekg ordered today demonstrates NSR, septal Q waves   Recent Labs: 07/11/2022: ALT 14 07/13/2022: B Natriuretic Peptide 10.5; BUN 7; Creatinine, Ser 0.86; Hemoglobin 13.3; Platelets 236; Potassium 3.3; Sodium 141 07/14/2022: Magnesium 1.7   Lipid Panel    Component Value Date/Time   CHOL 130 06/19/2020 0424   TRIG 67 06/19/2020 0424   HDL 27 (L) 06/19/2020 0424   CHOLHDL 4.8  06/19/2020 0424   VLDL 13 06/19/2020 0424   LDLCALC 90 06/19/2020 0424     Other studies Reviewed: Additional studies/ records that were reviewed today with results demonstrating: ER records reviewed.   ASSESSMENT AND PLAN:  Chest pain: Nonexertional.  Only occurs with emotional stress.  He has a very difficult social situation.  He has a lot of stress from his brother who controls his disability money.  He is working on improving the situation.  His living situation is not stable.  If symptoms worsen, could reconsider ischemic testing.  He would need to show compliance with medications prior to a PCI. HTN: Did not tolerate metoprolol in the past.  Repeat reading better today.  Restart amlodipine 5 mg daily.  His options for improved lifestyle are very limited.  His food choices are limited.  Try to increase walking. Morbid obesity: He has lost weight in the last few years, hopefully this will continue and his blood pressure will improve.   Current medicines are reviewed at length with the patient today.  The patient concerns regarding his medicines were addressed.  The following changes have been made:  No change  Labs/ tests ordered today include:  No orders of the defined types were placed in this encounter.   Recommend 150 minutes/week of aerobic exercise Low fat, low carb, high fiber diet recommended  Disposition:   FU in 6 months   Signed, Lance Muss, MD  08/08/2022 1:22 PM    [facility] [address] [place], [place], [place]  [address] Phone: [phone]; Fax: [phone]

## 2022-08-08 NOTE — Patient Instructions (Signed)
Medication Instructions:  Your physician has recommended you make the following change in your medication:  1-START Amlodipine 5 mg by mouth daily.  *If you need a refill on your cardiac medications before your next appointment, please call your pharmacy*  Lab Work: If you have labs (blood work) drawn today and your tests are completely normal, you will receive your results only by: MyChart Message (if you have MyChart) OR A paper copy in the mail If you have any lab test that is abnormal or we need to change your treatment, we will call you to review the results.  Follow-Up: At [facility], you and your health needs are our priority.  As part of our continuing mission to provide you with exceptional heart care, we have created designated Provider Care Teams.  These Care Teams include your primary Cardiologist (physician) and Advanced Practice Providers (APPs -  Physician Assistants and Nurse Practitioners) who all work together to provide you with the care you need, when you need it.  We recommend signing up for the patient portal called "MyChart".  Sign up information is provided on this After Visit Summary.  MyChart is used to connect with patients for Virtual Visits (Telemedicine).  Patients are able to view lab/test results, encounter notes, upcoming appointments, etc.  Non-urgent messages can be sent to your provider as well.   To learn more about what you can do with MyChart, go to [email or web address].    Your next appointment:   6 month(s)  The format for your next appointment:   In Person  Provider:   Lance Muss, MD      Important Information About Sugar

## 2022-11-03 ENCOUNTER — Encounter (HOSPITAL_COMMUNITY): Payer: Self-pay

## 2022-11-03 ENCOUNTER — Emergency Department (HOSPITAL_COMMUNITY)
Admission: EM | Admit: 2022-11-03 | Discharge: 2022-11-03 | Disposition: A | Discharge status: Home / Self Care | Payer: 59 | Attending: Emergency Medicine | Admitting: Emergency Medicine

## 2022-11-03 ENCOUNTER — Emergency Department (HOSPITAL_COMMUNITY): Payer: 59

## 2022-11-03 ENCOUNTER — Other Ambulatory Visit: Payer: Self-pay

## 2022-11-03 DIAGNOSIS — I10 Essential (primary) hypertension: Secondary | ICD-10-CM | POA: Diagnosis not present

## 2022-11-03 DIAGNOSIS — R443 Hallucinations, unspecified: Secondary | ICD-10-CM | POA: Diagnosis not present

## 2022-11-03 DIAGNOSIS — Z79899 Other long term (current) drug therapy: Secondary | ICD-10-CM | POA: Insufficient documentation

## 2022-11-03 DIAGNOSIS — R002 Palpitations: Secondary | ICD-10-CM | POA: Diagnosis not present

## 2022-11-03 DIAGNOSIS — R079 Chest pain, unspecified: Secondary | ICD-10-CM | POA: Diagnosis present

## 2022-11-03 DIAGNOSIS — R61 Generalized hyperhidrosis: Secondary | ICD-10-CM | POA: Insufficient documentation

## 2022-11-03 DIAGNOSIS — R253 Fasciculation: Secondary | ICD-10-CM | POA: Diagnosis not present

## 2022-11-03 DIAGNOSIS — L905 Scar conditions and fibrosis of skin: Secondary | ICD-10-CM | POA: Insufficient documentation

## 2022-11-03 DIAGNOSIS — F419 Anxiety disorder, unspecified: Secondary | ICD-10-CM | POA: Insufficient documentation

## 2022-11-03 DIAGNOSIS — F10132 Alcohol abuse with withdrawal with perceptual disturbance: Secondary | ICD-10-CM | POA: Insufficient documentation

## 2022-11-03 DIAGNOSIS — Y906 Blood alcohol level of 120-199 mg/100 ml: Secondary | ICD-10-CM | POA: Insufficient documentation

## 2022-11-03 DIAGNOSIS — R251 Tremor, unspecified: Secondary | ICD-10-CM | POA: Insufficient documentation

## 2022-11-03 DIAGNOSIS — F10932 Alcohol use, unspecified with withdrawal with perceptual disturbance: Secondary | ICD-10-CM

## 2022-11-03 LAB — BASIC METABOLIC PANEL
Anion gap: 10 (ref 5–15)
BUN: <5 mg/dL — ABNORMAL LOW (ref 6–20)
CO2: 22 mmol/L (ref 22–32)
Calcium: 7.4 mg/dL — ABNORMAL LOW (ref 8.9–10.3)
Chloride: 107 mmol/L (ref 98–111)
Creatinine, Ser: 0.81 mg/dL (ref 0.61–1.24)
GFR, Estimated: >60 mL/min (ref >60)
Glucose, Bld: 99 mg/dL (ref 70–99)
Potassium: 3.6 mmol/L (ref 3.5–5.1)
Sodium: 139 mmol/L (ref 135–145)

## 2022-11-03 LAB — CBC
HCT: 41.6 % (ref 39.0–52.0)
Hemoglobin: 13.6 g/dL (ref 13.0–17.0)
MCH: 27.5 pg (ref 26.0–34.0)
MCHC: 32.7 g/dL (ref 30.0–36.0)
MCV: 84 fL (ref 80.0–100.0)
Platelets: 243 10*3/uL (ref 150–400)
RBC: 4.95 MIL/uL (ref 4.22–5.81)
RDW: 14.5 % (ref 11.5–15.5)
WBC: 4.2 10*3/uL (ref 4.0–10.5)
nRBC: 0 % (ref 0.0–0.2)

## 2022-11-03 LAB — TROPONIN I (HIGH SENSITIVITY)
Troponin I (High Sensitivity): 4 ng/L (ref <18)
Troponin I (High Sensitivity): 4 ng/L (ref <18)

## 2022-11-03 LAB — ETHANOL: Alcohol, Ethyl (B): 141 mg/dL — ABNORMAL HIGH (ref <10)

## 2022-11-03 MED ORDER — CHLORDIAZEPOXIDE HCL 25 MG PO CAPS
50.0000 mg | ORAL_CAPSULE | ORAL | Status: AC
  Administered 2022-11-03: 50 mg via ORAL
  Filled 2022-11-03: qty 2

## 2022-11-03 MED ORDER — LORAZEPAM 2 MG/ML IJ SOLN
1.0000 mg | Freq: Once | INTRAMUSCULAR | Status: AC
  Administered 2022-11-03: 1 mg via INTRAVENOUS
  Filled 2022-11-03: qty 1

## 2022-11-03 MED ORDER — CHLORDIAZEPOXIDE HCL 25 MG PO CAPS: ORAL_CAPSULE | ORAL | 0 refills | Status: DC

## 2022-11-03 NOTE — ED Provider Notes (Signed)
[facility] Provider Note   CSN: [id number] Arrival date & time: 11/03/22  1052     History  Chief Complaint  Patient presents with   Chest Pain   alcohol withdraw    [name] is a 52 y.o. male.  52 yo M with hx of PTSD and etoh abuse who presents with chest pain and etoh wd. Past 5 days was drinking five 24 packs of beer in past few days, and liquor and seltzer. Feels that he is in alcohol withdrawal. Thinks he is hallucinating, having tremors, palpitations, diaphoresis, and feels anxious.  Says that his hallucinations are animal faces.  Cp intermittent that started this morning and described it as a sharp stabbing pain in his left upper chest that was fleeting.  Currently not having shortness of breath and denies any runny nose, sore throat, or cough recently.  EMS gave 324 of aspirin.  Denies any other substance use.  Has been seen frequently in the emergency department for similar symptoms.  Denies any SI or HI.   , Major depressive disorder, cocaine abuse, bipolar epigastric ttp on exam Pupils pinpoint with ue weakness  hypertension, obesity, alcohol use disorder, bipolar 1 disorder, polysubstance abuse, MDD  Cocaine abuse Last drink 7 hours ago        Home Medications Prior to Admission medications   Medication Sig Start Date End Date Taking? Authorizing Provider  chlordiazePOXIDE (LIBRIUM) 25 MG capsule '50mg'$  PO TID x 1D, then 25-'50mg'$  PO BID X 1D, then 25-'50mg'$  PO QD X 1D 11/03/22  Yes Fransico Meadow, MD  amLODipine (NORVASC) 5 MG tablet Take 1 tablet (5 mg total) by mouth daily. 08/08/22   Jettie Booze, MD  calcium carbonate (OSCAL) 1500 (600 Ca) MG TABS tablet Take 1 tablet (1,500 mg total) by mouth 2 (two) times daily with a meal for 3 days. Patient not taking: Reported on 12/29/2021 12/21/21 12/29/21  Sherwood Gambler, MD  chlordiazePOXIDE (LIBRIUM) 25 MG capsule '50mg'$  PO TID x 1D, then 25-'50mg'$  PO BID X 1D, then  25-'50mg'$  PO QD X 1D Patient not taking: Reported on 08/08/2022 07/06/22   Maudie Flakes, MD  cloNIDine (CATAPRES) 0.2 MG tablet Take 1 tablet (0.2 mg total) by mouth 3 (three) times daily as needed. Patient not taking: Reported on 08/08/2022 09/05/20   Davonna Belling, MD  doxepin (SINEQUAN) 10 MG capsule Take 1 capsule (10 mg total) by mouth at bedtime. Patient not taking: Reported on 08/08/2022 09/05/20   Davonna Belling, MD  nirmatrelvir & ritonavir (PAXLOVID, 300/100,) 20 x 150 MG & 10 x '100MG'$  TBPK Take 3 tablets by mouth twice daily. Patient not taking: Reported on 08/08/2022 10/02/21   Juanito Doom, MD  potassium chloride SA (KLOR-CON M) 20 MEQ tablet Take 2 tablets (40 mEq total) by mouth 2 (two) times daily for 5 days. Patient not taking: Reported on 12/29/2021 12/21/21 12/29/21  Sherwood Gambler, MD  potassium chloride SA (KLOR-CON M) 20 MEQ tablet Take 1 tablet (20 mEq total) by mouth 2 (two) times daily for 3 days. 12/24/21 12/27/21  Jeanell Sparrow, DO  sucralfate (CARAFATE) 1 g tablet Take 1 tablet (1 g total) by mouth 4 (four) times daily before meals and at bedtime for 7 days. Patient not taking: Reported on 12/29/2021 12/24/21 12/31/21  Wynona Dove A, DO      Allergies    Metoprolol    Review of Systems   Review of Systems  Physical  Exam Updated Vital Signs BP (!) 166/96   Pulse 74   Temp 98.1 F (36.7 C) (Oral)   Resp 17   Ht '5\' 7"'$  (1.702 m)   Wt 115.6 kg   SpO2 100%   BMI 39.92 kg/m  Physical Exam Vitals and nursing note reviewed.  Constitutional:      General: He is not in acute distress.    Appearance: He is well-developed.     Comments: After first dose of Ativan no tremors noted at rest but does have tremor with outstretched hands  HENT:     Head: Normocephalic and atraumatic.     Right Ear: External ear normal.     Left Ear: External ear normal.     Nose: Nose normal.  Eyes:     Extraocular Movements: Extraocular movements intact.     Conjunctiva/sclera:  Conjunctivae normal.     Pupils: Pupils are equal, round, and reactive to light.  Cardiovascular:     Rate and Rhythm: Normal rate and regular rhythm.     Heart sounds: Normal heart sounds.  Pulmonary:     Effort: Pulmonary effort is normal. No respiratory distress.     Breath sounds: Normal breath sounds.  Abdominal:     General: There is no distension.     Palpations: Abdomen is soft. There is no mass.     Tenderness: There is no abdominal tenderness. There is no guarding.  Musculoskeletal:     Cervical back: Normal range of motion and neck supple.     Right lower leg: No edema.     Left lower leg: No edema.  Skin:    General: Skin is warm and dry.     Comments: Healed scars on bilateral upper extremities from cutting no recent scars  Neurological:     General: No focal deficit present.     Mental Status: He is alert. Mental status is at baseline.     Comments: Mild tongue fasciculations  Psychiatric:        Mood and Affect: Mood normal.        Behavior: Behavior normal.     ED Results / Procedures / Treatments   Labs (all labs ordered are listed, but only abnormal results are displayed) Labs Reviewed  ETHANOL - Abnormal; Notable for the following components:      Result Value   Alcohol, Ethyl (B) 141 (*)    All other components within normal limits  BASIC METABOLIC PANEL - Abnormal; Notable for the following components:   BUN <5 (*)    Calcium 7.4 (*)    All other components within normal limits  CBC  TROPONIN I (HIGH SENSITIVITY)  TROPONIN I (HIGH SENSITIVITY)    EKG EKG Interpretation  Date/Time:  Monday November 03 2022 11:00:00 EST Ventricular Rate:  87 PR Interval:  168 QRS Duration: 92 QT Interval:  366 QTC Calculation: 440 R Axis:   27 Text Interpretation: Normal sinus rhythm Septal infarct , age undetermined Abnormal ECG When compared with ECG of 13-Jul-2022 15:53, PREVIOUS ECG IS PRESENT No significant change since last tracing Confirmed by Pattricia Boss [phone]) on 11/03/2022 11:04:48 AM  Radiology DG Chest 2 View  Result Date: 11/03/2022 CLINICAL DATA:  Chest pain EXAM: CHEST - 2 VIEW COMPARISON:  Radiograph 07/13/2022 FINDINGS: Unchanged cardiomediastinal silhouette. There is no focal airspace consolidation. There is no pleural effusion or evidence of pneumothorax. Mild thoracic spondylosis. No acute osseous abnormality. IMPRESSION: No evidence of acute cardiopulmonary disease. Electronically Signed  By: Maurine Simmering M.D.   On: 11/03/2022 12:03    Procedures Procedures   Medications Ordered in ED Medications  LORazepam (ATIVAN) injection 1 mg (1 mg Intravenous Given 11/03/22 1117)  chlordiazePOXIDE (LIBRIUM) capsule 50 mg (50 mg Oral Given 11/03/22 1442)    ED Course/ Medical Decision Making/ A&P                             Medical Decision Making Amount and/or Complexity of Data Reviewed Labs: ordered. Radiology: ordered.  Risk Prescription drug management.   [name] is a 52 y.o. male with comorbidities that complicate the patient evaluation including alcohol use disorder, bipolar, depression, and cocaine abuse who presents emergency department with chest pain and concern for alcohol withdrawal  Initial Ddx:  Alcohol withdrawal, SI/HI, MI, PE  MDM:  Concern the patient may be having alcohol withdrawal at this time.  Does appear to have a tremor but appears that when he is not thinking about it his tremor improves.  No significant vital sign abnormalities at this time.  Appears to be mentating well so we will treat the patient with CIWA protocol and Ativan as needed.  Please chest pain could potentially be due to MIs will obtain EKG and troponins.  Denies any recent cocaine use.  No infectious symptoms suggest pleurisy and does not have any significant shortness of breath tachycardia, or hypoxia that would suggest PE.  Plan:  Labs Troponin EKG Chest x-ray CIWA protocol  ED Summary/Re-evaluation:  Patient was  reassessed in the emergency department and was feeling much better after being given Ativan.  Was given a dose of Librium prior to discharge.  Had EKG and serial troponins that were reassuring.  Given his frequent emergency department visits with reassuring evaluations feel is safe for him to be discharged at this time.  Will have him follow-up with his primary doctor in several days regarding his symptoms.  This patient presents to the ED for concern of complaints listed in HPI, this involves an extensive number of treatment options, and is a complaint that carries with it a high risk of complications and morbidity. Disposition including potential need for admission considered.   Dispo: DC Home. Return precautions discussed including, but not limited to, those listed in the AVS. Allowed pt time to ask questions which were answered fully prior to dc.  Additional history obtained from EMS Records reviewed ED Visit Notes The following labs were independently interpreted: Serial Troponins and show no acute abnormality I independently reviewed the following imaging with scope of interpretation limited to determining acute life threatening conditions related to emergency care: Chest x-ray and agree with the radiologist interpretation with the following exceptions: None I personally reviewed and interpreted cardiac monitoring: normal sinus rhythm  I personally reviewed and interpreted the pt's EKG: see above for interpretation  I have reviewed the patients home medications and made adjustments as needed Social Determinants of health:  ETOH abuse  Final Clinical Impression(s) / ED Diagnoses Final diagnoses:  Alcohol withdrawal syndrome with perceptual disturbance ([place])  Chest pain, unspecified type    Rx / DC Orders ED Discharge Orders          Ordered    chlordiazePOXIDE (LIBRIUM) 25 MG capsule        11/03/22 1436              Fransico Meadow, MD 11/03/22 1732

## 2022-11-03 NOTE — ED Notes (Signed)
This RN reviewed discharge instructions with patient. He verbalized understanding and denied any further questions. PT well appearing upon discharge and denies pain. Pt ambulated with stable gait to exit. Pt endorses ride home.

## 2022-11-03 NOTE — ED Triage Notes (Signed)
Pt arrived via [facility] for c/o left chest pain that radiates to left upper back started 2 hrs ago. EMS gave ASA 324 mg. Pt c/o alcohol withdraw and DT's. Pt's last ETOH intake was 7 hrs ago. Pt is A&Ox3 to place, self and situation

## 2022-11-03 NOTE — Discharge Instructions (Signed)
You were seen for your alcohol withdrawal and chest pain in the emergency department.   At home, please take the Librium we have prescribed you.  Do not take the Librium if you start drinking alcohol as it can cause you to stop breathing.  Check your MyChart online for the results of any tests that had not resulted by the time you left the emergency department.   Follow-up with your primary doctor in 2-3 days regarding your visit.    Return immediately to the emergency department if you experience any of the following: Worsening chest pain, difficulty breathing, seizures, or any other concerning symptoms.    Thank you for visiting our Emergency Department. It was a pleasure taking care of you today.

## 2022-11-12 ENCOUNTER — Emergency Department (HOSPITAL_COMMUNITY)
Admission: EM | Admit: 2022-11-12 | Discharge: 2022-11-12 | Discharge status: Against Medical Advice | Payer: 59 | Attending: Emergency Medicine | Admitting: Emergency Medicine

## 2022-11-12 ENCOUNTER — Encounter (HOSPITAL_COMMUNITY): Payer: Self-pay | Admitting: Emergency Medicine

## 2022-11-12 ENCOUNTER — Other Ambulatory Visit: Payer: Self-pay

## 2022-11-12 ENCOUNTER — Emergency Department (HOSPITAL_COMMUNITY)
Admission: EM | Admit: 2022-11-12 | Discharge: 2022-11-13 | Disposition: A | Discharge status: Home / Self Care | Payer: 59 | Source: Home / Self Care | Attending: Emergency Medicine | Admitting: Emergency Medicine

## 2022-11-12 DIAGNOSIS — Z765 Malingerer [conscious simulation]: Secondary | ICD-10-CM | POA: Insufficient documentation

## 2022-11-12 DIAGNOSIS — Z79899 Other long term (current) drug therapy: Secondary | ICD-10-CM | POA: Insufficient documentation

## 2022-11-12 DIAGNOSIS — J45909 Unspecified asthma, uncomplicated: Secondary | ICD-10-CM | POA: Insufficient documentation

## 2022-11-12 DIAGNOSIS — F10129 Alcohol abuse with intoxication, unspecified: Secondary | ICD-10-CM | POA: Insufficient documentation

## 2022-11-12 DIAGNOSIS — Z5321 Procedure and treatment not carried out due to patient leaving prior to being seen by health care provider: Secondary | ICD-10-CM | POA: Diagnosis not present

## 2022-11-12 DIAGNOSIS — I10 Essential (primary) hypertension: Secondary | ICD-10-CM | POA: Insufficient documentation

## 2022-11-12 DIAGNOSIS — F1090 Alcohol use, unspecified, uncomplicated: Secondary | ICD-10-CM | POA: Insufficient documentation

## 2022-11-12 DIAGNOSIS — Y900 Blood alcohol level of less than 20 mg/100 ml: Secondary | ICD-10-CM | POA: Insufficient documentation

## 2022-11-12 DIAGNOSIS — Y909 Presence of alcohol in blood, level not specified: Secondary | ICD-10-CM | POA: Insufficient documentation

## 2022-11-12 DIAGNOSIS — R519 Headache, unspecified: Secondary | ICD-10-CM | POA: Diagnosis present

## 2022-11-12 LAB — CBC
HCT: 40.7 % (ref 39.0–52.0)
Hemoglobin: 12.9 g/dL — ABNORMAL LOW (ref 13.0–17.0)
MCH: 27.3 pg (ref 26.0–34.0)
MCHC: 31.7 g/dL (ref 30.0–36.0)
MCV: 86.2 fL (ref 80.0–100.0)
Platelets: 223 10*3/uL (ref 150–400)
RBC: 4.72 MIL/uL (ref 4.22–5.81)
RDW: 15.1 % (ref 11.5–15.5)
WBC: 5.4 10*3/uL (ref 4.0–10.5)
nRBC: 0 % (ref 0.0–0.2)

## 2022-11-12 LAB — COMPREHENSIVE METABOLIC PANEL
ALT: 21 U/L (ref 0–44)
AST: 33 U/L (ref 15–41)
Albumin: 3.5 g/dL (ref 3.5–5.0)
Alkaline Phosphatase: 126 U/L (ref 38–126)
Anion gap: 12 (ref 5–15)
BUN: 5 mg/dL — ABNORMAL LOW (ref 6–20)
CO2: 21 mmol/L — ABNORMAL LOW (ref 22–32)
Calcium: 7.7 mg/dL — ABNORMAL LOW (ref 8.9–10.3)
Chloride: 104 mmol/L (ref 98–111)
Creatinine, Ser: 0.71 mg/dL (ref 0.61–1.24)
GFR, Estimated: >60 mL/min (ref >60)
Glucose, Bld: 91 mg/dL (ref 70–99)
Potassium: 4.2 mmol/L (ref 3.5–5.1)
Sodium: 137 mmol/L (ref 135–145)
Total Bilirubin: 0.5 mg/dL (ref 0.3–1.2)
Total Protein: 6.8 g/dL (ref 6.5–8.1)

## 2022-11-12 LAB — MAGNESIUM: Magnesium: 2.1 mg/dL (ref 1.7–2.4)

## 2022-11-12 LAB — PHOSPHORUS: Phosphorus: 2.4 mg/dL — ABNORMAL LOW (ref 2.5–4.6)

## 2022-11-12 NOTE — ED Provider Triage Note (Cosign Needed)
Emergency Medicine Provider Triage Evaluation Note  [name] , a 52 y.o. male  was evaluated in triage.  Pt complains of etoh use.   Pt is here for intoxication. States he has been drinking liquor for 4 days straight. States last drink was 5 hours ago. He drank two 24 pack of beer.  States he suffers from severe ptsd and that drinking is all he can do to help.   No head injuries but states he has a headache and feels generally unwell.   Review of Systems  Positive: headache Negative: Fever   Physical Exam  BP (!) 156/103   Pulse 92   Temp 98.2 F (36.8 C) (Oral)   Resp 18   SpO2 94%  Gen:   Awake, no distress   Resp:  Normal effort  MSK:   Moves extremities without difficulty  Other:  Moves all 4 extremities. Slightly shaky, Aox4. Tearful when talking about PTSD.   Medical Decision Making  Medically screening exam initiated at 1:07 PM.  Appropriate orders placed.  [name] was informed that the remainder of the evaluation will be completed by another provider, this initial triage assessment does not replace that evaluation, and the importance of remaining in the ED until their evaluation is complete.  Still intoxicated. Additional evaluation will be required.    Pati Gallo [place], [place] 11/12/22 1312

## 2022-11-12 NOTE — ED Triage Notes (Signed)
Pt states he is going through alcohol withdrawal. Pt states last drink was approx 7 hours ago. Denies any drug use.

## 2022-11-12 NOTE — ED Triage Notes (Signed)
BIBA from home with c/o headache and ETOH usage.  Pt lays in floor when moving from ED stretcher to triage chair. Patient assisted up off floor into triage chair.  Last drink 5 hours PTA and denies any other substance abuse.  Hx of cocaine usage.

## 2022-11-13 DIAGNOSIS — R519 Headache, unspecified: Secondary | ICD-10-CM | POA: Diagnosis not present

## 2022-11-13 LAB — ETHANOL: Alcohol, Ethyl (B): <10 mg/dL (ref <10)

## 2022-11-13 MED ORDER — PHENOBARBITAL SODIUM 65 MG/ML IJ SOLN
130.0000 mg | Freq: Once | INTRAMUSCULAR | Status: AC
  Administered 2022-11-13: 130 mg via INTRAVENOUS
  Filled 2022-11-13: qty 2

## 2022-11-13 NOTE — ED Provider Notes (Signed)
[facility] Provider Note  CSN: [id number] Arrival date & time: 11/12/22 1754  Chief Complaint(s) Withdrawal  HPI [name] is a 52 y.o. male with a past medical history listed below including alcohol use disorder here with concerns for possible withdrawal.  Patient has been seen on numerous occasions for alcohol use disorder/withdrawal.  Last seen on March 4 for the same and placed on Librium taper.  Patient reports that he completed the taper but given his living situation, he became stressed out and started drinking again.  States that he has been drinking for the past 4 days.  He is complaining of being tremulous.  Denies any nausea or vomiting.  No chest pain or shortness of breath.  No abdominal pain.  No other physical complaints.  HPI  Past Medical History Past Medical History:  Diagnosis Date   Alcohol dependence with withdrawal ([place])    Anxiety    Arrhythmia    Asthma    Atrial fibrillation ([place])    not on AC   Bipolar 1 disorder ([place])    Class 2 obesity    Cocaine abuse ([place])    Depression    Heart palpitations    with severe anxiety   Hyperlipidemia    Hypertension    PTSD (post-traumatic stress disorder)    PTSD (post-traumatic stress disorder)    Syncope and collapse    Patient Active Problem List   Diagnosis Date Noted   ETOH abuse 07/09/2022   Malingering 09/19/2020   Alcohol dependence with alcohol-induced mood disorder ([place])    Alcohol withdrawal ([place]) 08/22/2020   Bipolar 1 disorder ([place]) 06/20/2020   MDD (major depressive disorder), recurrent, severe, with psychosis ([place]) 06/20/2020   Cocaine abuse ([place]) 06/14/2020   Class 2 obesity due to excess calories with body mass index (BMI) of 39.0 to 39.9 in adult 06/14/2020   Alcohol abuse with alcohol-induced mood disorder ([place]) 01/18/2017   Major depressive disorder, recurrent, severe without psychotic features ([place])    Cocaine dependence with  cocaine-induced mood disorder ([place])    Alcohol use disorder, severe, dependence ([place]) 04/12/2015   Hypertension 09/08/2011   Post traumatic stress disorder (PTSD) 09/08/2011   Home Medication(s) Prior to Admission medications   Medication Sig Start Date End Date Taking? Authorizing Provider  amLODipine (NORVASC) 5 MG tablet Take 1 tablet (5 mg total) by mouth daily. 08/08/22   Jettie Booze, MD  calcium carbonate (OSCAL) 1500 (600 Ca) MG TABS tablet Take 1 tablet (1,500 mg total) by mouth 2 (two) times daily with a meal for 3 days. Patient not taking: Reported on 12/29/2021 12/21/21 12/29/21  Sherwood Gambler, MD  chlordiazePOXIDE (LIBRIUM) 25 MG capsule '50mg'$  PO TID x 1D, then 25-'50mg'$  PO BID X 1D, then 25-'50mg'$  PO QD X 1D Patient not taking: Reported on 08/08/2022 07/06/22   Maudie Flakes, MD  chlordiazePOXIDE (LIBRIUM) 25 MG capsule '50mg'$  PO TID x 1D, then 25-'50mg'$  PO BID X 1D, then 25-'50mg'$  PO QD X 1D 11/03/22   Fransico Meadow, MD  cloNIDine (CATAPRES) 0.2 MG tablet Take 1 tablet (0.2 mg total) by mouth 3 (three) times daily as needed. Patient not taking: Reported on 08/08/2022 09/05/20   Davonna Belling, MD  doxepin (SINEQUAN) 10 MG capsule Take 1 capsule (10 mg total) by mouth at bedtime. Patient not taking: Reported on 08/08/2022 09/05/20   Davonna Belling, MD  nirmatrelvir & ritonavir (PAXLOVID, 300/100,) 20 x 150 MG & 10 x '100MG'$   TBPK Take 3 tablets by mouth twice daily. Patient not taking: Reported on 08/08/2022 10/02/21   Juanito Doom, MD  potassium chloride SA (KLOR-CON M) 20 MEQ tablet Take 2 tablets (40 mEq total) by mouth 2 (two) times daily for 5 days. Patient not taking: Reported on 12/29/2021 12/21/21 12/29/21  Sherwood Gambler, MD  potassium chloride SA (KLOR-CON M) 20 MEQ tablet Take 1 tablet (20 mEq total) by mouth 2 (two) times daily for 3 days. 12/24/21 12/27/21  Jeanell Sparrow, DO  sucralfate (CARAFATE) 1 g tablet Take 1 tablet (1 g total) by mouth 4 (four) times daily before  meals and at bedtime for 7 days. Patient not taking: Reported on 12/29/2021 12/24/21 12/31/21  Wynona Dove A, DO                                                                                                                                    Allergies Metoprolol  Review of Systems Review of Systems As noted in HPI  Physical Exam Vital Signs  I have reviewed the triage vital signs BP (!) 137/91   Pulse 85   Temp 98.9 F (37.2 C) (Oral)   Resp 17   Ht '5\' 7"'$  (1.702 m)   Wt 117 kg   SpO2 100%   BMI 40.41 kg/m   Physical Exam Vitals reviewed.  Constitutional:      General: He is not in acute distress.    Appearance: He is well-developed. He is not diaphoretic.  HENT:     Head: Normocephalic and atraumatic.     Right Ear: External ear normal.     Left Ear: External ear normal.     Nose: Nose normal.     Mouth/Throat:     Mouth: Mucous membranes are moist.  Eyes:     General: No scleral icterus.    Conjunctiva/sclera: Conjunctivae normal.  Neck:     Trachea: Phonation normal.  Cardiovascular:     Rate and Rhythm: Normal rate and regular rhythm.  Pulmonary:     Effort: Pulmonary effort is normal. No respiratory distress.     Breath sounds: No stridor.  Abdominal:     General: There is no distension.  Musculoskeletal:        General: Normal range of motion.     Cervical back: Normal range of motion.  Skin:    Comments: Numerous bilateral upper extremity scars from self-mutilation  Neurological:     Mental Status: He is alert and oriented to person, place, and time.  Psychiatric:        Behavior: Behavior normal.     ED Results and Treatments Labs (all labs ordered are listed, but only abnormal results are displayed) Labs Reviewed  COMPREHENSIVE METABOLIC PANEL - Abnormal; Notable for the following components:      Result Value   CO2 21 (*)    BUN 5 (*)    Calcium 7.7 (*)  All other components within normal limits  PHOSPHORUS - Abnormal; Notable for the  following components:   Phosphorus 2.4 (*)    All other components within normal limits  CBC - Abnormal; Notable for the following components:   Hemoglobin 12.9 (*)    All other components within normal limits  MAGNESIUM  ETHANOL                                                                                                                         EKG  EKG Interpretation  Date/Time:    Ventricular Rate:    PR Interval:    QRS Duration:   QT Interval:    QTC Calculation:   R Axis:     Text Interpretation:         Radiology No results found.  Medications Ordered in ED Medications  PHENObarbital (LUMINAL) injection 130 mg (130 mg Intravenous Given 11/13/22 0319)                                                                                                                                     Procedures Procedures  (including critical care time)  Medical Decision Making / ED Course  Click here for ABCD2, HEART and other calculators  Medical Decision Making Amount and/or Complexity of Data Reviewed Labs: ordered. Decision-making details documented in ED Course.  Risk Prescription drug management. Parenteral controlled substances.  Patient presents with concern for possible alcohol withdrawal.  Vitals are stable. Labs reassuring without significant electrolyte derangements or renal insufficiency. EtOH is negative. He does not appear to be in severe withdrawals at this time. Provided with dose of phenobarbital.    5:58 AM On reassessment, patient reported significant relief.  He is stable without signs of withdrawal. Appropriate for outpatient management Provided resources   Final Clinical Impression(s) / ED Diagnoses Final diagnoses:  Alcohol use disorder   The patient appears reasonably screened and/or stabilized for discharge and I doubt any other medical condition or other [facility] requiring further screening, evaluation, or treatment in the ED at this time. I  have discussed the findings, Dx and Tx plan with the patient/family who expressed understanding and agree(s) with the plan. Discharge instructions discussed at length. The patient/family was given strict return precautions who verbalized understanding of the instructions. No further questions at time of discharge.  Disposition: Discharge  Condition: Good  ED Discharge Orders  None      Follow Up: Primary care provider  Schedule an appointment as soon as possible for a visit    [facility] Address: [address], [place], [place] [address] Hours: Open 24 hours Monday through Sunday Phone: [phone] Go to  as needed           This chart was dictated using voice recognition software.  Despite best efforts to proofread,  errors can occur which can change the documentation meaning.    Fatima Blank, MD 11/13/22 [phone]

## 2022-11-16 DIAGNOSIS — R079 Chest pain, unspecified: Secondary | ICD-10-CM

## 2023-01-04 ENCOUNTER — Encounter (HOSPITAL_COMMUNITY): Payer: Self-pay

## 2023-01-04 ENCOUNTER — Emergency Department (HOSPITAL_COMMUNITY): Payer: 59

## 2023-01-04 ENCOUNTER — Observation Stay (HOSPITAL_COMMUNITY)
Admission: EM | Admit: 2023-01-04 | Discharge: 2023-01-05 | Disposition: A | Discharge status: Home / Self Care | Payer: 59 | Attending: Family Medicine | Admitting: Family Medicine

## 2023-01-04 ENCOUNTER — Other Ambulatory Visit: Payer: Self-pay

## 2023-01-04 DIAGNOSIS — E876 Hypokalemia: Secondary | ICD-10-CM | POA: Insufficient documentation

## 2023-01-04 DIAGNOSIS — J45909 Unspecified asthma, uncomplicated: Secondary | ICD-10-CM | POA: Insufficient documentation

## 2023-01-04 DIAGNOSIS — Y900 Blood alcohol level of less than 20 mg/100 ml: Secondary | ICD-10-CM | POA: Diagnosis not present

## 2023-01-04 DIAGNOSIS — F10930 Alcohol use, unspecified with withdrawal, uncomplicated: Principal | ICD-10-CM

## 2023-01-04 DIAGNOSIS — F1010 Alcohol abuse, uncomplicated: Secondary | ICD-10-CM | POA: Diagnosis present

## 2023-01-04 DIAGNOSIS — Z79899 Other long term (current) drug therapy: Secondary | ICD-10-CM | POA: Diagnosis not present

## 2023-01-04 DIAGNOSIS — K5900 Constipation, unspecified: Secondary | ICD-10-CM | POA: Diagnosis not present

## 2023-01-04 DIAGNOSIS — F10230 Alcohol dependence with withdrawal, uncomplicated: Secondary | ICD-10-CM | POA: Diagnosis not present

## 2023-01-04 DIAGNOSIS — Z7722 Contact with and (suspected) exposure to environmental tobacco smoke (acute) (chronic): Secondary | ICD-10-CM | POA: Diagnosis not present

## 2023-01-04 DIAGNOSIS — I10 Essential (primary) hypertension: Secondary | ICD-10-CM | POA: Diagnosis not present

## 2023-01-04 DIAGNOSIS — F319 Bipolar disorder, unspecified: Secondary | ICD-10-CM | POA: Diagnosis present

## 2023-01-04 DIAGNOSIS — E6609 Other obesity due to excess calories: Secondary | ICD-10-CM | POA: Diagnosis not present

## 2023-01-04 DIAGNOSIS — F10939 Alcohol use, unspecified with withdrawal, unspecified: Secondary | ICD-10-CM | POA: Diagnosis present

## 2023-01-04 DIAGNOSIS — Z6839 Body mass index (BMI) 39.0-39.9, adult: Secondary | ICD-10-CM | POA: Insufficient documentation

## 2023-01-04 DIAGNOSIS — I4891 Unspecified atrial fibrillation: Secondary | ICD-10-CM | POA: Diagnosis not present

## 2023-01-04 LAB — CBC
HCT: 45.3 % (ref 39.0–52.0)
Hemoglobin: 14.7 g/dL (ref 13.0–17.0)
MCH: 26.8 pg (ref 26.0–34.0)
MCHC: 32.5 g/dL (ref 30.0–36.0)
MCV: 82.7 fL (ref 80.0–100.0)
Platelets: 278 10*3/uL (ref 150–400)
RBC: 5.48 MIL/uL (ref 4.22–5.81)
RDW: 13.6 % (ref 11.5–15.5)
WBC: 8.5 10*3/uL (ref 4.0–10.5)
nRBC: 0 % (ref 0.0–0.2)

## 2023-01-04 LAB — COMPREHENSIVE METABOLIC PANEL
ALT: 17 U/L (ref 0–44)
AST: 31 U/L (ref 15–41)
Albumin: 4.2 g/dL (ref 3.5–5.0)
Alkaline Phosphatase: 148 U/L — ABNORMAL HIGH (ref 38–126)
Anion gap: 12 (ref 5–15)
BUN: <5 mg/dL — ABNORMAL LOW (ref 6–20)
CO2: 22 mmol/L (ref 22–32)
Calcium: 7.4 mg/dL — ABNORMAL LOW (ref 8.9–10.3)
Chloride: 100 mmol/L (ref 98–111)
Creatinine, Ser: 0.85 mg/dL (ref 0.61–1.24)
GFR, Estimated: >60 mL/min (ref >60)
Glucose, Bld: 104 mg/dL — ABNORMAL HIGH (ref 70–99)
Potassium: 2.8 mmol/L — ABNORMAL LOW (ref 3.5–5.1)
Sodium: 134 mmol/L — ABNORMAL LOW (ref 135–145)
Total Bilirubin: 0.6 mg/dL (ref 0.3–1.2)
Total Protein: 7.9 g/dL (ref 6.5–8.1)

## 2023-01-04 LAB — RAPID URINE DRUG SCREEN, HOSP PERFORMED
Amphetamines: NOT DETECTED
Barbiturates: NOT DETECTED
Benzodiazepines: NOT DETECTED
Cocaine: NOT DETECTED
Opiates: NOT DETECTED
Tetrahydrocannabinol: NOT DETECTED

## 2023-01-04 LAB — MAGNESIUM: Magnesium: 2 mg/dL (ref 1.7–2.4)

## 2023-01-04 LAB — ETHANOL: Alcohol, Ethyl (B): <10 mg/dL (ref <10)

## 2023-01-04 LAB — PHOSPHORUS: Phosphorus: 2 mg/dL — ABNORMAL LOW (ref 2.5–4.6)

## 2023-01-04 MED ORDER — KETOROLAC TROMETHAMINE 15 MG/ML IJ SOLN
15.0000 mg | Freq: Once | INTRAMUSCULAR | Status: AC
  Administered 2023-01-04: 15 mg via INTRAVENOUS
  Filled 2023-01-04: qty 1

## 2023-01-04 MED ORDER — LACTATED RINGERS IV BOLUS
1000.0000 mL | Freq: Once | INTRAVENOUS | Status: AC
  Administered 2023-01-04: 1000 mL via INTRAVENOUS

## 2023-01-04 MED ORDER — FOLIC ACID 1 MG PO TABS
1.0000 mg | ORAL_TABLET | Freq: Every day | ORAL | Status: DC
  Administered 2023-01-04 – 2023-01-05 (×2): 1 mg via ORAL
  Filled 2023-01-04 (×2): qty 1

## 2023-01-04 MED ORDER — ADULT MULTIVITAMIN W/MINERALS CH
1.0000 | ORAL_TABLET | Freq: Every day | ORAL | Status: DC
  Administered 2023-01-04 – 2023-01-05 (×2): 1 via ORAL
  Filled 2023-01-04 (×2): qty 1

## 2023-01-04 MED ORDER — POTASSIUM CHLORIDE CRYS ER 20 MEQ PO TBCR
40.0000 meq | EXTENDED_RELEASE_TABLET | Freq: Once | ORAL | Status: AC
  Administered 2023-01-04: 40 meq via ORAL
  Filled 2023-01-04: qty 2

## 2023-01-04 MED ORDER — K PHOS MONO-SOD PHOS DI & MONO 155-852-130 MG PO TABS
250.0000 mg | ORAL_TABLET | Freq: Once | ORAL | Status: AC
  Administered 2023-01-04: 250 mg via ORAL
  Filled 2023-01-04: qty 1

## 2023-01-04 MED ORDER — POTASSIUM CHLORIDE 10 MEQ/100ML IV SOLN
10.0000 meq | INTRAVENOUS | Status: AC
  Administered 2023-01-04 – 2023-01-05 (×3): 10 meq via INTRAVENOUS
  Filled 2023-01-04 (×3): qty 100

## 2023-01-04 MED ORDER — KETOROLAC TROMETHAMINE 60 MG/2ML IM SOLN: 60.0000 mg | Freq: Once | INTRAMUSCULAR | Status: DC

## 2023-01-04 MED ORDER — THIAMINE HCL 100 MG/ML IJ SOLN
100.0000 mg | Freq: Every day | INTRAMUSCULAR | Status: DC
  Filled 2023-01-04: qty 2

## 2023-01-04 MED ORDER — LORAZEPAM 1 MG PO TABS
1.0000 mg | ORAL_TABLET | ORAL | Status: DC | PRN
  Administered 2023-01-04: 2 mg via ORAL
  Administered 2023-01-04: 3 mg via ORAL
  Administered 2023-01-05 (×3): 2 mg via ORAL
  Filled 2023-01-04: qty 2
  Filled 2023-01-04: qty 3
  Filled 2023-01-04 (×3): qty 2

## 2023-01-04 MED ORDER — THIAMINE MONONITRATE 100 MG PO TABS
100.0000 mg | ORAL_TABLET | Freq: Every day | ORAL | Status: DC
  Administered 2023-01-04 – 2023-01-05 (×2): 100 mg via ORAL
  Filled 2023-01-04 (×2): qty 1

## 2023-01-04 NOTE — ED Triage Notes (Signed)
Pt. BIB [facility] for alcohol withdrawals. Last drink 11 hrs ago. Pt. Arrives with tremors and spotty vision.

## 2023-01-04 NOTE — ED Provider Notes (Signed)
[facility] EMERGENCY DEPARTMENT AT [facility] Provider Note   CSN: [id number] Arrival date & time: 01/04/23  1903     History  Chief Complaint  Patient presents with   Alcohol Problem    [name] is a 52 y.o. male.   Alcohol Problem     53 year old male with medical history significant for bipolar disorder, prior cutting behaviors, depression, PTSD, obesity, cocaine abuse, alcohol dependence with associated withdrawal who presents to the emergency department with concern for alcohol withdrawal.  The patient states that his last drink was 11 hours prior to arrival.  He endorses a history of alcohol withdrawal seizures.  He normally drinks around 13 beers a day.  He feels tremulous and anxious.  He denies any neurologic deficits.  Additionally, the patient states that he has been having left-sided flank pain for the same amount of time, described as sharp, he denies any history of kidney stones.  He denies any dysuria or increased urinary frequency, no fevers or chills.  No vomiting.  Home Medications Prior to Admission medications   Medication Sig Start Date End Date Taking? Authorizing Provider  amLODipine (NORVASC) 5 MG tablet Take 1 tablet (5 mg total) by mouth daily. 08/08/22   Corky Crafts, MD  calcium carbonate (OSCAL) 1500 (600 Ca) MG TABS tablet Take 1 tablet (1,500 mg total) by mouth 2 (two) times daily with a meal for 3 days. Patient not taking: Reported on 12/29/2021 12/21/21 12/29/21  Pricilla Loveless, MD  chlordiazePOXIDE (LIBRIUM) 25 MG capsule 50mg  PO TID x 1D, then 25-50mg  PO BID X 1D, then 25-50mg  PO QD X 1D Patient not taking: Reported on 08/08/2022 07/06/22   Sabas Sous, MD  chlordiazePOXIDE (LIBRIUM) 25 MG capsule 50mg  PO TID x 1D, then 25-50mg  PO BID X 1D, then 25-50mg  PO QD X 1D 11/03/22   Rondel Baton, MD  cloNIDine (CATAPRES) 0.2 MG tablet Take 1 tablet (0.2 mg total) by mouth 3 (three) times daily as needed. Patient not taking:  Reported on 08/08/2022 09/05/20   Benjiman Core, MD  doxepin (SINEQUAN) 10 MG capsule Take 1 capsule (10 mg total) by mouth at bedtime. Patient not taking: Reported on 08/08/2022 09/05/20   Benjiman Core, MD  nirmatrelvir & ritonavir (PAXLOVID, 300/100,) 20 x 150 MG & 10 x 100MG  TBPK Take 3 tablets by mouth twice daily. Patient not taking: Reported on 08/08/2022 10/02/21   Lupita Leash, MD  potassium chloride SA (KLOR-CON M) 20 MEQ tablet Take 2 tablets (40 mEq total) by mouth 2 (two) times daily for 5 days. Patient not taking: Reported on 12/29/2021 12/21/21 12/29/21  Pricilla Loveless, MD  potassium chloride SA (KLOR-CON M) 20 MEQ tablet Take 1 tablet (20 mEq total) by mouth 2 (two) times daily for 3 days. 12/24/21 12/27/21  Sloan Leiter, DO  sucralfate (CARAFATE) 1 g tablet Take 1 tablet (1 g total) by mouth 4 (four) times daily before meals and at bedtime for 7 days. Patient not taking: Reported on 12/29/2021 12/24/21 12/31/21  Tanda Rockers A, DO      Allergies    Metoprolol    Review of Systems   Review of Systems  All other systems reviewed and are negative.   Physical Exam Updated Vital Signs BP (!) 160/120   Pulse 79   Temp 98.4 F (36.9 C) (Oral)   Resp 18   Ht 5\' 7"  (1.702 m)   Wt 112.5 kg   SpO2 100%   BMI  38.84 kg/m  Physical Exam Vitals and nursing note reviewed.  Constitutional:      General: He is not in acute distress.    Appearance: He is well-developed.  HENT:     Head: Normocephalic and atraumatic.  Eyes:     Conjunctiva/sclera: Conjunctivae normal.  Cardiovascular:     Rate and Rhythm: Normal rate and regular rhythm.     Heart sounds: No murmur heard. Pulmonary:     Effort: Pulmonary effort is normal. No respiratory distress.     Breath sounds: Normal breath sounds.  Abdominal:     Palpations: Abdomen is soft.     Tenderness: There is no abdominal tenderness.  Musculoskeletal:        General: No swelling.     Cervical back: Neck supple.  Skin:     General: Skin is warm and dry.     Capillary Refill: Capillary refill takes less than 2 seconds.  Neurological:     Mental Status: He is alert.     Comments: MENTAL STATUS EXAM:    Orientation: Alert and oriented to person, place and time.  Memory: Cooperative, follows commands well.  Language: Speech is clear and language is normal.   CRANIAL NERVES:    CN 2 (Optic): Visual fields intact to confrontation.  CN 3,4,6 (EOM): Pupils equal and reactive to light. Full extraocular eye movement without nystagmus.  CN 5 (Trigeminal): Facial sensation is normal, no weakness of masticatory muscles.  CN 7 (Facial): No facial weakness or asymmetry.  CN 8 (Auditory): Auditory acuity grossly normal.  CN 9,10 (Glossophar): The uvula is midline, the palate elevates symmetrically.  CN 11 (spinal access): Normal sternocleidomastoid and trapezius strength.  CN 12 (Hypoglossal): The tongue is midline. No atrophy or fasciculations.Marland Kitchen   MOTOR:  Muscle Strength: 5/5RUE, 5/5LUE, 5/5RLE, 5/5LLE.   COORDINATION:   Mild dysmetria, tremor noted diffusely.   SENSATION:   Intact to light touch all four extremities.    Psychiatric:        Attention and Perception: Attention and perception normal.        Mood and Affect: Mood normal.        Behavior: Behavior normal. Behavior is cooperative.        Thought Content: Thought content normal. Thought content does not include homicidal or suicidal ideation.     ED Results / Procedures / Treatments   Labs (all labs ordered are listed, but only abnormal results are displayed) Labs Reviewed  COMPREHENSIVE METABOLIC PANEL - Abnormal; Notable for the following components:      Result Value   Sodium 134 (*)    Potassium 2.8 (*)    Glucose, Bld 104 (*)    BUN <5 (*)    Calcium 7.4 (*)    Alkaline Phosphatase 148 (*)    All other components within normal limits  PHOSPHORUS - Abnormal; Notable for the following components:   Phosphorus 2.0 (*)    All other  components within normal limits  ETHANOL  CBC  RAPID URINE DRUG SCREEN, HOSP PERFORMED  MAGNESIUM  URINALYSIS, ROUTINE W REFLEX MICROSCOPIC    EKG None  Radiology CT Renal Stone Study  Result Date: 01/04/2023 CLINICAL DATA:  Abdominal and flank pain.  Stone suspected. EXAM: CT ABDOMEN AND PELVIS WITHOUT CONTRAST TECHNIQUE: Multidetector CT imaging of the abdomen and pelvis was performed following the standard protocol without IV contrast. RADIATION DOSE REDUCTION: This exam was performed according to the departmental dose-optimization program which includes automated exposure control, adjustment of  the mA and/or kV according to patient size and/or use of iterative reconstruction technique. COMPARISON:  None Available. FINDINGS: Lower chest: There are small eventrations along both hemidiaphragms. The lung bases are clear. The cardiac size is normal for there is a small hiatal hernia. Hepatobiliary: The liver is 18 cm length and mildly steatotic. No mass is seen without contrast. Attenuation is homogeneous. The gallbladder and bile ducts are unremarkable. Pancreas: Unremarkable without contrast. Spleen: Unremarkable without contrast.  Normal in size. Adrenals/Urinary Tract: There is no adrenal mass. There is a homogeneous 1 cm homogeneous cyst posteriorly in the upper pole of the right kidney, Hounsfield density is 17.5. No other focal abnormality of the renal cortex is seen without contrast. No follow-up imaging is recommended. There is no evidence of urinary stones or obstruction and there is no bladder thickening. Stomach/Bowel: Small hiatal hernia. No dilatation or wall thickening in the stomach and small bowel, normal appendix. Incidentally noted is a 2.1 cm thin walled diverticulum containing a small amount of fluid, located along the superior wall of the junction of the second and third portions of the duodenum. Mild fecal stasis. There is diffuse diverticulosis through the sigmoid segment, without  acute inflammatory change. No wall thickening or dilatation. Vascular/Lymphatic: No significant vascular findings are present. No enlarged abdominal or pelvic lymph nodes. Reproductive: No prostatomegaly. Other: There are small umbilical and inguinal fat hernias. There is no incarcerated hernia. There is no free air, free hemorrhage or free fluid, no acute inflammatory changes or abscess. Musculoskeletal: Degenerative disc disease lower thoracic spine, L4-5. There is advanced L4-5 facet hypertrophy with grade 1 degenerative anterolisthesis. No acute or other significant osseous findings. IMPRESSION: 1. No acute noncontrast CT abnormality. 2. No evidence of urinary stones or obstruction. 3. Constipation and diffuse diverticulosis. 4. Mildly prominent liver with mild steatosis. 5. Small hiatal hernia.  Umbilical and inguinal fat hernias. 6. Advanced L4-5 facet hypertrophy with grade 1 degenerative anterolisthesis. Electronically Signed   By: Almira Bar M.D.   On: 01/04/2023 22:56    Procedures Procedures    Medications Ordered in ED Medications  LORazepam (ATIVAN) tablet 1-4 mg (3 mg Oral Given 01/04/23 2229)  thiamine (VITAMIN B1) tablet 100 mg (100 mg Oral Given 01/04/23 2006)    Or  thiamine (VITAMIN B1) injection 100 mg ( Intravenous See Alternative 01/04/23 2006)  folic acid (FOLVITE) tablet 1 mg (1 mg Oral Given 01/04/23 2006)  multivitamin with minerals tablet 1 tablet (1 tablet Oral Given 01/04/23 2006)  potassium chloride 10 mEq in 100 mL IVPB (10 mEq Intravenous New Bag/Given 01/04/23 2238)  phosphorus (K PHOS NEUTRAL) tablet 250 mg (250 mg Oral Given 01/04/23 2229)  potassium chloride SA (KLOR-CON M) CR tablet 40 mEq (40 mEq Oral Given 01/04/23 2229)  ketorolac (TORADOL) 15 MG/ML injection 15 mg (15 mg Intravenous Given 01/04/23 2228)  lactated ringers bolus 1,000 mL (1,000 mLs Intravenous New Bag/Given 01/04/23 2234)    ED Course/ Medical Decision Making/ A&P                             Medical  Decision Making Amount and/or Complexity of Data Reviewed Labs: ordered. Radiology: ordered.  Risk OTC drugs. Prescription drug management. Decision regarding hospitalization.    52 year old male with medical history significant for bipolar disorder, prior cutting behaviors, depression, PTSD, obesity, cocaine abuse, alcohol dependence with associated withdrawal who presents to the emergency department with concern for alcohol withdrawal.  The  patient states that his last drink was 11 hours prior to arrival.  He endorses a history of alcohol withdrawal seizures.  He normally drinks around 13 beers a day.  He feels tremulous and anxious.  He denies any neurologic deficits.  Additionally, the patient states that he has been having left-sided flank pain for the same amount of time, described as sharp, he denies any history of kidney stones.  He denies any dysuria or increased urinary frequency, no fevers or chills.  No vomiting.  On arrival, the patient was vitally stable, CIWA of 13 on arrival with concern for alcohol withdrawal.  Patient was placed on CIWA protocol.  Laboratory evaluation was obtained and was significant for hypokalemia to 2.8, hypophosphatemia to 2.0, UDS negative, magnesium normal, ethanol level negative, CBC without a leukocytosis or anemia.  A CT renal stone study was performed: IMPRESSION:  1. No acute noncontrast CT abnormality.  2. No evidence of urinary stones or obstruction.  3. Constipation and diffuse diverticulosis.  4. Mildly prominent liver with mild steatosis.  5. Small hiatal hernia.  Umbilical and inguinal fat hernias.  6. Advanced L4-5 facet hypertrophy with grade 1 degenerative  anterolisthesis.     The patient's potassium was supplemented orally and IV.  Due to the patient's elevated CIWA, hospitalist medicine was consulted for admission.      Final Clinical Impression(s) / ED Diagnoses Final diagnoses:  Alcohol withdrawal syndrome without complication  (HCC)  Constipation, unspecified constipation type  Hypokalemia    Rx / DC Orders ED Discharge Orders     None         Ernie Avena, MD 01/04/23 2334

## 2023-01-05 ENCOUNTER — Other Ambulatory Visit (HOSPITAL_COMMUNITY): Payer: Self-pay

## 2023-01-05 ENCOUNTER — Other Ambulatory Visit: Payer: Self-pay

## 2023-01-05 DIAGNOSIS — F10930 Alcohol use, unspecified with withdrawal, uncomplicated: Secondary | ICD-10-CM | POA: Diagnosis not present

## 2023-01-05 DIAGNOSIS — F10230 Alcohol dependence with withdrawal, uncomplicated: Secondary | ICD-10-CM | POA: Diagnosis not present

## 2023-01-05 LAB — URINALYSIS, ROUTINE W REFLEX MICROSCOPIC
Bacteria, UA: NONE SEEN
Bilirubin Urine: NEGATIVE
Glucose, UA: NEGATIVE mg/dL
Ketones, ur: NEGATIVE mg/dL
Leukocytes,Ua: NEGATIVE
Nitrite: NEGATIVE
Protein, ur: NEGATIVE mg/dL
Specific Gravity, Urine: 1.006 (ref 1.005–1.030)
pH: 8 (ref 5.0–8.0)

## 2023-01-05 LAB — BASIC METABOLIC PANEL
Anion gap: 8 (ref 5–15)
BUN: 6 mg/dL (ref 6–20)
CO2: 24 mmol/L (ref 22–32)
Calcium: 7.6 mg/dL — ABNORMAL LOW (ref 8.9–10.3)
Chloride: 104 mmol/L (ref 98–111)
Creatinine, Ser: 0.68 mg/dL (ref 0.61–1.24)
GFR, Estimated: >60 mL/min (ref >60)
Glucose, Bld: 117 mg/dL — ABNORMAL HIGH (ref 70–99)
Potassium: 3.3 mmol/L — ABNORMAL LOW (ref 3.5–5.1)
Sodium: 136 mmol/L (ref 135–145)

## 2023-01-05 LAB — HIV ANTIBODY (ROUTINE TESTING W REFLEX): HIV Screen 4th Generation wRfx: NONREACTIVE

## 2023-01-05 LAB — CBC
HCT: 41 % (ref 39.0–52.0)
Hemoglobin: 13.2 g/dL (ref 13.0–17.0)
MCH: 26.9 pg (ref 26.0–34.0)
MCHC: 32.2 g/dL (ref 30.0–36.0)
MCV: 83.7 fL (ref 80.0–100.0)
Platelets: 233 10*3/uL (ref 150–400)
RBC: 4.9 MIL/uL (ref 4.22–5.81)
RDW: 13.6 % (ref 11.5–15.5)
WBC: 5.8 10*3/uL (ref 4.0–10.5)
nRBC: 0 % (ref 0.0–0.2)

## 2023-01-05 MED ORDER — CHLORDIAZEPOXIDE HCL 25 MG PO CAPS
25.0000 mg | ORAL_CAPSULE | Freq: Four times a day (QID) | ORAL | Status: DC
  Administered 2023-01-05 (×2): 25 mg via ORAL
  Filled 2023-01-05 (×2): qty 1

## 2023-01-05 MED ORDER — AMLODIPINE BESYLATE 5 MG PO TABS
5.0000 mg | ORAL_TABLET | Freq: Every day | ORAL | 1 refills | Status: DC
  Filled 2023-01-05: qty 30, 30d supply, fill #0

## 2023-01-05 MED ORDER — HYDRALAZINE HCL 20 MG/ML IJ SOLN: 5.0000 mg | Freq: Four times a day (QID) | INTRAMUSCULAR | Status: DC | PRN

## 2023-01-05 MED ORDER — CHLORDIAZEPOXIDE HCL 25 MG PO CAPS
ORAL_CAPSULE | ORAL | 0 refills | Status: AC
  Filled 2023-01-05: qty 12, 6d supply, fill #0

## 2023-01-05 MED ORDER — ENOXAPARIN SODIUM 40 MG/0.4ML IJ SOSY
40.0000 mg | PREFILLED_SYRINGE | INTRAMUSCULAR | Status: DC
  Administered 2023-01-05: 40 mg via SUBCUTANEOUS
  Filled 2023-01-05: qty 0.4

## 2023-01-05 MED ORDER — POLYETHYLENE GLYCOL 3350 17 G PO PACK: 17.0000 g | PACK | Freq: Every day | ORAL | Status: DC | PRN

## 2023-01-05 MED ORDER — HYDROXYZINE HCL 25 MG PO TABS: 25.0000 mg | ORAL_TABLET | Freq: Four times a day (QID) | ORAL | Status: DC | PRN

## 2023-01-05 MED ORDER — CHLORDIAZEPOXIDE HCL 25 MG PO CAPS
25.0000 mg | ORAL_CAPSULE | Freq: Once | ORAL | Status: AC
  Administered 2023-01-05: 25 mg via ORAL
  Filled 2023-01-05: qty 1

## 2023-01-05 MED ORDER — DOCUSATE SODIUM 100 MG PO CAPS
100.0000 mg | ORAL_CAPSULE | Freq: Two times a day (BID) | ORAL | Status: DC
  Administered 2023-01-05 (×2): 100 mg via ORAL
  Filled 2023-01-05 (×2): qty 1

## 2023-01-05 MED ORDER — AMLODIPINE BESYLATE 10 MG PO TABS
5.0000 mg | ORAL_TABLET | Freq: Every day | ORAL | Status: DC
  Administered 2023-01-05: 5 mg via ORAL
  Filled 2023-01-05: qty 1

## 2023-01-05 MED ORDER — POTASSIUM CHLORIDE CRYS ER 20 MEQ PO TBCR
40.0000 meq | EXTENDED_RELEASE_TABLET | Freq: Once | ORAL | Status: AC
  Administered 2023-01-05: 40 meq via ORAL
  Filled 2023-01-05: qty 2

## 2023-01-05 MED ORDER — ONDANSETRON 4 MG PO TBDP: 4.0000 mg | ORAL_TABLET | Freq: Four times a day (QID) | ORAL | Status: DC | PRN

## 2023-01-05 MED ORDER — TRAZODONE HCL 50 MG PO TABS: 25.0000 mg | ORAL_TABLET | Freq: Every evening | ORAL | Status: DC | PRN

## 2023-01-05 MED ORDER — LOPERAMIDE HCL 2 MG PO CAPS: 2.0000 mg | ORAL_CAPSULE | ORAL | Status: DC | PRN

## 2023-01-05 MED ORDER — ORAL CARE MOUTH RINSE: 15.0000 mL | OROMUCOSAL | Status: DC | PRN

## 2023-01-05 MED ORDER — CHLORDIAZEPOXIDE HCL 25 MG PO CAPS: 25.0000 mg | ORAL_CAPSULE | Freq: Every day | ORAL | Status: DC

## 2023-01-05 MED ORDER — CHLORDIAZEPOXIDE HCL 25 MG PO CAPS: 25.0000 mg | ORAL_CAPSULE | Freq: Three times a day (TID) | ORAL | Status: DC

## 2023-01-05 MED ORDER — ONDANSETRON HCL 4 MG/2ML IJ SOLN: 4.0000 mg | Freq: Four times a day (QID) | INTRAMUSCULAR | Status: DC | PRN

## 2023-01-05 MED ORDER — ALBUTEROL SULFATE (2.5 MG/3ML) 0.083% IN NEBU: 2.5000 mg | INHALATION_SOLUTION | RESPIRATORY_TRACT | Status: DC | PRN

## 2023-01-05 MED ORDER — ACETAMINOPHEN 325 MG PO TABS: 650.0000 mg | ORAL_TABLET | Freq: Four times a day (QID) | ORAL | Status: DC | PRN

## 2023-01-05 MED ORDER — ACETAMINOPHEN 650 MG RE SUPP: 650.0000 mg | Freq: Four times a day (QID) | RECTAL | Status: DC | PRN

## 2023-01-05 MED ORDER — CHLORDIAZEPOXIDE HCL 25 MG PO CAPS: 25.0000 mg | ORAL_CAPSULE | ORAL | Status: DC

## 2023-01-05 MED ORDER — ONDANSETRON HCL 4 MG PO TABS: 4.0000 mg | ORAL_TABLET | Freq: Four times a day (QID) | ORAL | Status: DC | PRN

## 2023-01-05 NOTE — Care Management CC44 (Signed)
Condition Code 44 Documentation Completed  Patient Details  Name: KANNIN QIN MRN: [id number] Date of Birth: 1970-10-22   Condition Code 44 given:  Yes Patient signature on Condition Code 44 notice:  Yes Documentation of 2 MD's agreement:  Yes Code 44 added to claim:       Larrie Kass, LCSW 01/05/2023, 3:08 PM

## 2023-01-05 NOTE — Progress Notes (Signed)
Pt requested DC from MD earlier today. Pt was educated by MD and this RN on the risks of leaving today with alcohol withdrawal. PT also educated on medications including chlordiazepoxide and the risks of not taking correctly, abruptly stopping or mixing with alcohol. Pt verbalized understanding of all and insisted on leaving. Pt requested a bus pass. Case management notified and was in process of getting for pt. When this RN entered room to provide DC paperwork and reinforce all education, pt was not in the room. RN found pt on the 1st floor near the front entrance of hospital. DC education provided at that time with pt approval. Pt declined waiting for bus pass. Pt verbalized understanding, education materials given to pt. DC medications in pt's hand at that time. IV had been removed earlier this morning by pt.

## 2023-01-05 NOTE — TOC Initial Note (Signed)
Transition of Care [facility] - Initial/Assessment Note    Patient Details  Name: [name] MRN: [id number] Date of Birth: 04/23/1971  Transition of Care [facility]) CM/SW Contact:    Larrie Kass, LCSW Phone Number: 01/05/2023, 11:02 AM  Clinical Narrative:                    Substance use resources added to pt's AVS.    Patient Goals and CMS Choice            Expected Discharge Plan and Services                                              Prior Living Arrangements/Services                       Activities of Daily Living Home Assistive Devices/Equipment: None ADL Screening (condition at time of admission) Patient's cognitive ability adequate to safely complete daily activities?: Yes Is the patient deaf or have difficulty hearing?: No Does the patient have difficulty seeing, even when wearing glasses/contacts?: No Does the patient have difficulty concentrating, remembering, or making decisions?: No Patient able to express need for assistance with ADLs?: Yes Does the patient have difficulty dressing or bathing?: No Independently performs ADLs?: Yes (appropriate for developmental age) Does the patient have difficulty walking or climbing stairs?: No Weakness of Legs: None Weakness of Arms/Hands: None  Permission Sought/Granted                  Emotional Assessment              Admission diagnosis:  Alcohol withdrawal (HCC) [F10.939] Hypokalemia [E87.6] Alcohol withdrawal syndrome without complication (HCC) [F10.930] Constipation, unspecified constipation type [K59.00] Patient Active Problem List   Diagnosis Date Noted   Chest pain 11/16/2022   ETOH abuse 07/09/2022   Malingering 09/19/2020   Alcohol dependence with alcohol-induced mood disorder (HCC)    Alcohol withdrawal (HCC) 08/22/2020   Bipolar 1 disorder (HCC) 06/20/2020   MDD (major depressive disorder), recurrent, severe, with psychosis (HCC) 06/20/2020   Cocaine  abuse (HCC) 06/14/2020   Class 2 obesity due to excess calories with body mass index (BMI) of 39.0 to 39.9 in adult 06/14/2020   Alcohol abuse with alcohol-induced mood disorder (HCC) 01/18/2017   Major depressive disorder, recurrent, severe without psychotic features (HCC)    Cocaine dependence with cocaine-induced mood disorder (HCC)    Alcohol use disorder, severe, dependence (HCC) 04/12/2015   Hypertension 09/08/2011   Post traumatic stress disorder (PTSD) 09/08/2011   PCP:  Pcp, No Pharmacy:   [id number] [place], [place] - [address] [place] [address] Phone: [phone] Fax: [phone]     Social Determinants of Health (SDOH) Social History: SDOH Screenings   Food Insecurity: Food Insecurity Present (01/05/2023)  Housing: High Risk (01/05/2023)  Transportation Needs: Unmet Transportation Needs (01/05/2023)  Utilities: Not At Risk (01/05/2023)  Alcohol Screen: Medium Risk (06/20/2020)  Depression (PHQ2-9): Medium Risk (09/22/2021)  Tobacco Use: Medium Risk (01/04/2023)   SDOH Interventions:     Readmission Risk Interventions     No data to display

## 2023-01-05 NOTE — Plan of Care (Signed)
  Problem: Health Behavior/Discharge Planning: Goal: Ability to identify changes in lifestyle to reduce recurrence of condition will improve Outcome: Progressing   Problem: Health Behavior/Discharge Planning: Goal: Identification of resources available to assist in meeting health care needs will improve Outcome: Progressing   Problem: Physical Regulation: Goal: Complications related to the disease process, condition or treatment will be avoided or minimized Outcome: Progressing

## 2023-01-05 NOTE — Plan of Care (Signed)
  Problem: Safety: Goal: Ability to remain free from injury will improve Outcome: Progressing   Problem: Coping: Goal: Level of anxiety will decrease Outcome: Progressing   Problem: Clinical Measurements: Goal: Ability to maintain clinical measurements within normal limits will improve Outcome: Progressing   Problem: Education: Goal: Knowledge of General Education information will improve Description: Including pain rating scale, medication(s)/side effects and non-pharmacologic comfort measures Outcome: Progressing   

## 2023-01-05 NOTE — Discharge Summary (Signed)
Physician Discharge Summary  [name]:[id number] DOB: May 28, 1971 DOA: 01/04/2023  PCP: Pcp, No  Admit date: 01/04/2023 Discharge date: 01/05/2023  Time spent: 35 minutes  Recommendations for Outpatient Follow-up:  Follow outpatient CBC/CMP  Encourage etoh cessation Follow blood pressure   Discharge Diagnoses:  Active Problems:   Hypertension   Class 2 obesity due to excess calories with body mass index (BMI) of 39.0 to 39.9 in adult   Bipolar 1 disorder (HCC)   Alcohol withdrawal (HCC)   ETOH abuse   Discharge Condition: stable   Diet recommendation: heart healthy  Filed Weights   01/04/23 1907 01/05/23 0115  Weight: 112.5 kg 114.3 kg    History of present illness:    [name] is [name] 52 y.o. male with medical history significant for bipolar disorder, depression, PTSD, obesity, alcohol dependence and history of withdrawal presented to the emergency department with concern for left-sided flank pain and alcohol withdrawal.  Patient states he normally has [name] bowel movement daily, has not had bowel movement for the last couple of days.  Presents with [name] couple of days of left flank pain.  Also is worried that he has alcohol withdrawal since he feels anxious, jittery.  Denies any chest pain, fevers, vomiting, hallucinations.  His last drink of alcohol was Saturday 5/4 in the afternoon according to the patient.  He also states that he "went cold [place] "on Sunday.   He requested discharge on 5/6.  I recommended he stay as he reported prior hx of seizures, but he declined to stay.  D/c with librium taper.  See below for additional details  Hospital Course:  Assessment and Plan:  Alcohol Withdrawal Requesting discharge at this point (reports prior seizures, I recommend monitoring [name] few days given what sounds like hx prior complicated withdrawal - he requests discharge despite this, but expresses appreciation for care) Will d/c with librium  taper  Hypertension Amlodipine  Obesity Body mass index is 39.47 kg/m.  Bipolar disorder Follow outpatient     Procedures: none   Consultations: none  Discharge Exam: Vitals:   01/05/23 0911 01/05/23 1412  BP: (!) 158/101 135/85  Pulse: 82 83  Resp: 17 18  Temp: 99.1 F (37.3 C) 98.8 F (37.1 C)  SpO2: 98% 98%   Wants to leave Understands when I discuss why he should stay, but expresses [name] desire to leave regardless (expresses appreciation for our care) Denies SI  General: No acute distress. Cardiovascular: RRR Lungs: unlabored Neurological: mild tremor, alert and oriented, appropriate Extremities: No clubbing or cyanosis. No edema.  Discharge Instructions   Discharge Instructions     Call MD for:  difficulty breathing, headache or visual disturbances   Complete by: As directed    Call MD for:  extreme fatigue   Complete by: As directed    Call MD for:  hives   Complete by: As directed    Call MD for:  persistant dizziness or light-headedness   Complete by: As directed    Call MD for:  persistant nausea and vomiting   Complete by: As directed    Call MD for:  redness, tenderness, or signs of infection (pain, swelling, redness, odor or green/yellow discharge around incision site)   Complete by: As directed    Call MD for:  severe uncontrolled pain   Complete by: As directed    Call MD for:  temperature >100.4   Complete by: As directed    Diet - low sodium heart healthy  Complete by: As directed    Discharge instructions   Complete by: As directed    You were seen for alcohol withdrawal.  I recommend you stay as withdrawal peaks around 3-5 days.  Since you've requested to leave and understand the risks of leaving, we'll discharge you with [name] librium taper.  Return for worsening withdrawal symptoms.  Do not drink alcohol with librium.    Alcohol cessation is important.  Please follow up with your PCP and consider follow up with therapists or  counselors, AA, etc.   I'll send [name] refill of amlodipine in for you.  Please follow up with your PCP.  Return for new, recurrent or worsening symptoms.  Please ask your PCP to request records from this hospitalization so they know what was done and what the next steps will be.   Increase activity slowly   Complete by: As directed       Allergies as of 01/05/2023       Reactions   Metoprolol Other (See Comments)   Other reaction(s): Other Prostate problem Other reaction(s): Other, Prostate problem        Medication List     STOP taking these medications    calcium carbonate 1500 (600 Ca) MG Tabs tablet Commonly known as: OSCAL   cloNIDine 0.2 MG tablet Commonly known as: CATAPRES   doxepin 10 MG capsule Commonly known as: SINEQUAN   Paxlovid (300/100) 20 x 150 MG & 10 x 100MG  Tbpk Generic drug: nirmatrelvir & ritonavir   potassium chloride SA 20 MEQ tablet Commonly known as: KLOR-CON M   sucralfate 1 g tablet Commonly known as: Carafate       TAKE these medications    amLODipine 5 MG tablet Commonly known as: NORVASC Take 1 tablet (5 mg total) by mouth daily.   chlordiazePOXIDE 25 MG capsule Commonly known as: LIBRIUM Take 1 capsule (25 mg total) by mouth 3 (three) times daily for 2 days, THEN 1 capsule (25 mg total) 2 (two) times daily for 2 days, THEN 1 capsule (25 mg total) at bedtime for 2 days. Start taking on: Jan 05, 2023 What changed:  See the new instructions. Another medication with the same name was removed. Continue taking this medication, and follow the directions you see here.       Allergies  Allergen Reactions   Metoprolol Other (See Comments)    Other reaction(s): Other  Prostate problem  Other reaction(s): Other, Prostate problem      The results of significant diagnostics from this hospitalization (including imaging, microbiology, ancillary and laboratory) are listed below for reference.    Significant Diagnostic  Studies: CT Renal Stone Study  Result Date: 01/04/2023 CLINICAL DATA:  Abdominal and flank pain.  Stone suspected. EXAM: CT ABDOMEN AND PELVIS WITHOUT CONTRAST TECHNIQUE: Multidetector CT imaging of the abdomen and pelvis was performed following the standard protocol without IV contrast. RADIATION DOSE REDUCTION: This exam was performed according to the departmental dose-optimization program which includes automated exposure control, adjustment of the mA and/or kV according to patient size and/or use of iterative reconstruction technique. COMPARISON:  None Available. FINDINGS: Lower chest: There are small eventrations along both hemidiaphragms. The lung bases are clear. The cardiac size is normal for there is Daisi Kentner small hiatal hernia. Hepatobiliary: The liver is 18 cm length and mildly steatotic. No mass is seen without contrast. Attenuation is homogeneous. The gallbladder and bile ducts are unremarkable. Pancreas: Unremarkable without contrast. Spleen: Unremarkable without contrast.  Normal in size. Adrenals/Urinary  Tract: There is no adrenal mass. There is Legion Discher homogeneous 1 cm homogeneous cyst posteriorly in the upper pole of the right kidney, Hounsfield density is 17.5. No other focal abnormality of the renal cortex is seen without contrast. No follow-up imaging is recommended. There is no evidence of urinary stones or obstruction and there is no bladder thickening. Stomach/Bowel: Small hiatal hernia. No dilatation or wall thickening in the stomach and small bowel, normal appendix. Incidentally noted is Ruhi Kopke 2.1 cm thin walled diverticulum containing Tommye Lehenbauer small amount of fluid, located along the superior wall of the junction of the second and third portions of the duodenum. Mild fecal stasis. There is diffuse diverticulosis through the sigmoid segment, without acute inflammatory change. No wall thickening or dilatation. Vascular/Lymphatic: No significant vascular findings are present. No enlarged abdominal or pelvic lymph  nodes. Reproductive: No prostatomegaly. Other: There are small umbilical and inguinal fat hernias. There is no incarcerated hernia. There is no free air, free hemorrhage or free fluid, no acute inflammatory changes or abscess. Musculoskeletal: Degenerative disc disease lower thoracic spine, L4-5. There is advanced L4-5 facet hypertrophy with grade 1 degenerative anterolisthesis. No acute or other significant osseous findings. IMPRESSION: 1. No acute noncontrast CT abnormality. 2. No evidence of urinary stones or obstruction. 3. Constipation and diffuse diverticulosis. 4. Mildly prominent liver with mild steatosis. 5. Small hiatal hernia.  Umbilical and inguinal fat hernias. 6. Advanced L4-5 facet hypertrophy with grade 1 degenerative anterolisthesis. Electronically Signed   By: Almira Bar M.D.   On: 01/04/2023 22:56    Microbiology: No results found for this or any previous visit (from the past 240 hour(s)).   Labs: Basic Metabolic Panel: Recent Labs  Lab 01/04/23 1925 01/05/23 0401  NA 134* 136  K 2.8* 3.3*  CL 100 104  CO2 22 24  GLUCOSE 104* 117*  BUN <5* 6  CREATININE 0.85 0.68  CALCIUM 7.4* 7.6*  MG 2.0  --   PHOS 2.0*  --    Liver Function Tests: Recent Labs  Lab 01/04/23 1925  AST 31  ALT 17  ALKPHOS 148*  BILITOT 0.6  PROT 7.9  ALBUMIN 4.2   No results for input(s): "LIPASE", "AMYLASE" in the last 168 hours. No results for input(s): "AMMONIA" in the last 168 hours. CBC: Recent Labs  Lab 01/04/23 1925 01/05/23 0401  WBC 8.5 5.8  HGB 14.7 13.2  HCT 45.3 41.0  MCV 82.7 83.7  PLT 278 233   Cardiac Enzymes: No results for input(s): "CKTOTAL", "CKMB", "CKMBINDEX", "TROPONINI" in the last 168 hours. BNP: BNP (last 3 results) Recent Labs    07/13/22 1604  BNP 10.5    ProBNP (last 3 results) No results for input(s): "PROBNP" in the last 8760 hours.  CBG: No results for input(s): "GLUCAP" in the last 168 hours.     Signed:  Lacretia Nicks MD.   [facility] 01/05/2023, 2:46 PM

## 2023-01-05 NOTE — H&P (Signed)
History and Physical  [name] DGU:[id number] DOB: 16-Jan-1971 DOA: 01/04/2023  PCP: Pcp, No   Chief Complaint: Left flank pain, alcohol withdrawal  HPI: [name] is a 52 y.o. male with medical history significant for bipolar disorder, depression, PTSD, obesity, alcohol dependence and history of withdrawal presented to the emergency department with concern for left-sided flank pain and alcohol withdrawal.  Patient states he normally has a bowel movement daily, has not had bowel movement for the last couple of days.  Presents with a couple of days of left flank pain.  Also is worried that he has alcohol withdrawal since he feels anxious, jittery.  Denies any chest pain, fevers, vomiting, hallucinations.  His last drink of alcohol was Saturday 5/4 in the afternoon according to the patient.  He also states that he "went cold [place] "on Sunday.  ED Course: On evaluation in the emergency department, vital signs are stable except for blood pressure elevated 169/101.  Lab work including CBC and CMP is unremarkable except for potassium 2.8.  Due to complaints of left-sided flank pain, he also had CT scan which was quite unremarkable, with results listed below.  He was noted to have elevated CIWA score of 13, was given Ativan and hospitalist contacted for admission given the patient's prior history of alcohol withdrawal.  Review of Systems: Please see HPI for pertinent positives and negatives. A complete 10 system review of systems are otherwise negative.  Past Medical History:  Diagnosis Date   Alcohol dependence with withdrawal (HCC)    Anxiety    Arrhythmia    Asthma    Atrial fibrillation (HCC)    not on AC   Bipolar 1 disorder (HCC)    Class 2 obesity    Cocaine abuse (HCC)    Depression    Heart palpitations    with severe anxiety   Hyperlipidemia    Hypertension    PTSD (post-traumatic stress disorder)    PTSD (post-traumatic stress disorder)    Syncope and collapse    Past  Surgical History:  Procedure Laterality Date   NO PAST SURGERIES      Social History:  reports that he has never smoked. He has been exposed to tobacco smoke. He has never used smokeless tobacco. He reports that he does not currently use alcohol. He reports that he does not currently use drugs after having used the following drugs: Cocaine.   Allergies  Allergen Reactions   Metoprolol Other (See Comments)    Other reaction(s): Other  Prostate problem  Other reaction(s): Other, Prostate problem    Family History  Problem Relation Age of Onset   Alcohol abuse Mother    Heart disease Mother    Hypertension Father    Alcohol abuse Maternal Grandfather      Prior to Admission medications   Medication Sig Start Date End Date Taking? Authorizing Provider  amLODipine (NORVASC) 5 MG tablet Take 1 tablet (5 mg total) by mouth daily. 08/08/22   Corky Crafts, MD  calcium carbonate (OSCAL) 1500 (600 Ca) MG TABS tablet Take 1 tablet (1,500 mg total) by mouth 2 (two) times daily with a meal for 3 days. Patient not taking: Reported on 12/29/2021 12/21/21 12/29/21  Pricilla Loveless, MD  chlordiazePOXIDE (LIBRIUM) 25 MG capsule 50mg  PO TID x 1D, then 25-50mg  PO BID X 1D, then 25-50mg  PO QD X 1D Patient not taking: Reported on 08/08/2022 07/06/22   Sabas Sous, MD  chlordiazePOXIDE (LIBRIUM) 25 MG capsule 50mg   PO TID x 1D, then 25-50mg  PO BID X 1D, then 25-50mg  PO QD X 1D 11/03/22   Rondel Baton, MD  cloNIDine (CATAPRES) 0.2 MG tablet Take 1 tablet (0.2 mg total) by mouth 3 (three) times daily as needed. Patient not taking: Reported on 08/08/2022 09/05/20   Benjiman Core, MD  doxepin (SINEQUAN) 10 MG capsule Take 1 capsule (10 mg total) by mouth at bedtime. Patient not taking: Reported on 08/08/2022 09/05/20   Benjiman Core, MD  nirmatrelvir & ritonavir (PAXLOVID, 300/100,) 20 x 150 MG & 10 x 100MG  TBPK Take 3 tablets by mouth twice daily. Patient not taking: Reported on 08/08/2022  10/02/21   Lupita Leash, MD  potassium chloride SA (KLOR-CON M) 20 MEQ tablet Take 2 tablets (40 mEq total) by mouth 2 (two) times daily for 5 days. Patient not taking: Reported on 12/29/2021 12/21/21 12/29/21  Pricilla Loveless, MD  potassium chloride SA (KLOR-CON M) 20 MEQ tablet Take 1 tablet (20 mEq total) by mouth 2 (two) times daily for 3 days. 12/24/21 12/27/21  Sloan Leiter, DO  sucralfate (CARAFATE) 1 g tablet Take 1 tablet (1 g total) by mouth 4 (four) times daily before meals and at bedtime for 7 days. Patient not taking: Reported on 12/29/2021 12/24/21 12/31/21  Sloan Leiter, DO    Physical Exam: BP 128/75   Pulse 93   Temp 98.4 F (36.9 C) (Oral)   Resp 16   Ht 5\' 7"  (1.702 m)   Wt 112.5 kg   SpO2 96%   BMI 38.84 kg/m   General:  Alert, oriented, calm, in no acute distress, pleasant and cooperative. Eyes: EOMI, clear conjuctivae, white sclerea Neck: supple, no masses, trachea mildline  Cardiovascular: RRR, no murmurs or rubs, no peripheral edema  Respiratory: clear to auscultation bilaterally, no wheezes, no crackles  Abdomen: soft, nontender, nondistended, normal bowel tones heard  Skin: dry, no rashes  Musculoskeletal: no joint effusions, normal range of motion  Psychiatric: appropriate affect, normal speech  Neurologic: extraocular muscles intact, clear speech, moving all extremities with intact sensorium          Labs on Admission:  Basic Metabolic Panel: Recent Labs  Lab 01/04/23 1925  NA 134*  K 2.8*  CL 100  CO2 22  GLUCOSE 104*  BUN <5*  CREATININE 0.85  CALCIUM 7.4*  MG 2.0  PHOS 2.0*   Liver Function Tests: Recent Labs  Lab 01/04/23 1925  AST 31  ALT 17  ALKPHOS 148*  BILITOT 0.6  PROT 7.9  ALBUMIN 4.2   No results for input(s): "LIPASE", "AMYLASE" in the last 168 hours. No results for input(s): "AMMONIA" in the last 168 hours. CBC: Recent Labs  Lab 01/04/23 1925  WBC 8.5  HGB 14.7  HCT 45.3  MCV 82.7  PLT 278   Cardiac  Enzymes: No results for input(s): "CKTOTAL", "CKMB", "CKMBINDEX", "TROPONINI" in the last 168 hours.  BNP (last 3 results) Recent Labs    07/13/22 1604  BNP 10.5    ProBNP (last 3 results) No results for input(s): "PROBNP" in the last 8760 hours.  CBG: No results for input(s): "GLUCAP" in the last 168 hours.  Radiological Exams on Admission: CT Renal Stone Study  Result Date: 01/04/2023 CLINICAL DATA:  Abdominal and flank pain.  Stone suspected. EXAM: CT ABDOMEN AND PELVIS WITHOUT CONTRAST TECHNIQUE: Multidetector CT imaging of the abdomen and pelvis was performed following the standard protocol without IV contrast. RADIATION DOSE REDUCTION: This exam was performed  according to the departmental dose-optimization program which includes automated exposure control, adjustment of the mA and/or kV according to patient size and/or use of iterative reconstruction technique. COMPARISON:  None Available. FINDINGS: Lower chest: There are small eventrations along both hemidiaphragms. The lung bases are clear. The cardiac size is normal for there is a small hiatal hernia. Hepatobiliary: The liver is 18 cm length and mildly steatotic. No mass is seen without contrast. Attenuation is homogeneous. The gallbladder and bile ducts are unremarkable. Pancreas: Unremarkable without contrast. Spleen: Unremarkable without contrast.  Normal in size. Adrenals/Urinary Tract: There is no adrenal mass. There is a homogeneous 1 cm homogeneous cyst posteriorly in the upper pole of the right kidney, Hounsfield density is 17.5. No other focal abnormality of the renal cortex is seen without contrast. No follow-up imaging is recommended. There is no evidence of urinary stones or obstruction and there is no bladder thickening. Stomach/Bowel: Small hiatal hernia. No dilatation or wall thickening in the stomach and small bowel, normal appendix. Incidentally noted is a 2.1 cm thin walled diverticulum containing a small amount of fluid,  located along the superior wall of the junction of the second and third portions of the duodenum. Mild fecal stasis. There is diffuse diverticulosis through the sigmoid segment, without acute inflammatory change. No wall thickening or dilatation. Vascular/Lymphatic: No significant vascular findings are present. No enlarged abdominal or pelvic lymph nodes. Reproductive: No prostatomegaly. Other: There are small umbilical and inguinal fat hernias. There is no incarcerated hernia. There is no free air, free hemorrhage or free fluid, no acute inflammatory changes or abscess. Musculoskeletal: Degenerative disc disease lower thoracic spine, L4-5. There is advanced L4-5 facet hypertrophy with grade 1 degenerative anterolisthesis. No acute or other significant osseous findings. IMPRESSION: 1. No acute noncontrast CT abnormality. 2. No evidence of urinary stones or obstruction. 3. Constipation and diffuse diverticulosis. 4. Mildly prominent liver with mild steatosis. 5. Small hiatal hernia.  Umbilical and inguinal fat hernias. 6. Advanced L4-5 facet hypertrophy with grade 1 degenerative anterolisthesis. Electronically Signed   By: Almira Bar M.D.   On: 01/04/2023 22:56    Assessment/Plan 52 year old gentleman with history of obesity, hypertension, PTSD, bipolar 1 disorder, as well as alcohol abuse admitted to the hospital with concern for alcohol withdrawal.  Currently he has received p.o. Ativan 3 mg. -Observation admission -Regular diet -Scheduled Librium taper, with as needed Ativan per CIWA protocol -Chronic home medications will be continued once reconciled by pharmacy staff    Hypertension-likely complicated by his alcohol withdrawal symptoms   Class 2 obesity due to excess calories with body mass index (BMI) of 39.0 to 39.9 in adult-complicating all aspects of care   Bipolar 1 disorder (HCC)   ETOH abuse - TOC consulted for alcohol/substance abuse resources  DVT prophylaxis: Lovenox     Code  Status: Full Code  Consults called: None  Admission status: The appropriate patient status for this patient is INPATIENT. Inpatient status is judged to be reasonable and necessary in order to provide the required intensity of service to ensure the patient's safety. The patient's presenting symptoms, physical exam findings, and initial radiographic and laboratory data in the context of their chronic comorbidities is felt to place them at high risk for further clinical deterioration. Furthermore, it is not anticipated that the patient will be medically stable for discharge from the hospital within 2 midnights of admission.    I certify that at the point of admission it is my clinical judgment that the patient will  require inpatient hospital care spanning beyond 2 midnights from the point of admission due to high intensity of service, high risk for further deterioration and high frequency of surveillance required   Time spent: 50 minutes  Joeli Fenner Sharlette Dense MD [facility] Pager [phone]  If 7PM-7AM, please contact night-coverage [email or web address] Password [email or web address]  01/05/2023, 12:19 AM

## 2023-01-05 NOTE — Care Management Obs Status (Signed)
MEDICARE OBSERVATION STATUS NOTIFICATION   Patient Details  Name: [name] MRN: [id number] Date of Birth: 02-Aug-1971   Medicare Observation Status Notification Given:  Yes    Larrie Kass, LCSW 01/05/2023, 3:08 PM

## 2023-02-03 ENCOUNTER — Observation Stay (HOSPITAL_COMMUNITY)
Admission: EM | Admit: 2023-02-03 | Discharge: 2023-02-04 | Disposition: A | Discharge status: Home / Self Care | Payer: 59 | Attending: Internal Medicine | Admitting: Internal Medicine

## 2023-02-03 ENCOUNTER — Encounter (HOSPITAL_COMMUNITY): Payer: Self-pay | Admitting: Internal Medicine

## 2023-02-03 ENCOUNTER — Other Ambulatory Visit: Payer: Self-pay

## 2023-02-03 DIAGNOSIS — F10939 Alcohol use, unspecified with withdrawal, unspecified: Secondary | ICD-10-CM | POA: Diagnosis present

## 2023-02-03 DIAGNOSIS — F319 Bipolar disorder, unspecified: Secondary | ICD-10-CM | POA: Diagnosis present

## 2023-02-03 DIAGNOSIS — I4891 Unspecified atrial fibrillation: Secondary | ICD-10-CM | POA: Insufficient documentation

## 2023-02-03 DIAGNOSIS — F1590 Other stimulant use, unspecified, uncomplicated: Secondary | ICD-10-CM | POA: Diagnosis present

## 2023-02-03 DIAGNOSIS — F1410 Cocaine abuse, uncomplicated: Secondary | ICD-10-CM | POA: Diagnosis present

## 2023-02-03 DIAGNOSIS — I10 Essential (primary) hypertension: Secondary | ICD-10-CM | POA: Insufficient documentation

## 2023-02-03 DIAGNOSIS — F10930 Alcohol use, unspecified with withdrawal, uncomplicated: Secondary | ICD-10-CM | POA: Diagnosis present

## 2023-02-03 DIAGNOSIS — F4310 Post-traumatic stress disorder, unspecified: Secondary | ICD-10-CM | POA: Diagnosis present

## 2023-02-03 DIAGNOSIS — E6609 Other obesity due to excess calories: Secondary | ICD-10-CM

## 2023-02-03 DIAGNOSIS — Z79899 Other long term (current) drug therapy: Secondary | ICD-10-CM | POA: Diagnosis not present

## 2023-02-03 DIAGNOSIS — F1020 Alcohol dependence, uncomplicated: Secondary | ICD-10-CM | POA: Diagnosis present

## 2023-02-03 DIAGNOSIS — J45909 Unspecified asthma, uncomplicated: Secondary | ICD-10-CM | POA: Diagnosis not present

## 2023-02-03 DIAGNOSIS — G4733 Obstructive sleep apnea (adult) (pediatric): Secondary | ICD-10-CM | POA: Insufficient documentation

## 2023-02-03 DIAGNOSIS — Z6839 Body mass index (BMI) 39.0-39.9, adult: Secondary | ICD-10-CM | POA: Diagnosis not present

## 2023-02-03 DIAGNOSIS — E876 Hypokalemia: Secondary | ICD-10-CM | POA: Diagnosis not present

## 2023-02-03 LAB — URINALYSIS, ROUTINE W REFLEX MICROSCOPIC
Bilirubin Urine: NEGATIVE
Glucose, UA: NEGATIVE mg/dL
Ketones, ur: 5 mg/dL — AB
Leukocytes,Ua: NEGATIVE
Nitrite: NEGATIVE
Protein, ur: NEGATIVE mg/dL
Specific Gravity, Urine: 1.008 (ref 1.005–1.030)
pH: 8 (ref 5.0–8.0)

## 2023-02-03 LAB — CBC WITH DIFFERENTIAL/PLATELET
Abs Immature Granulocytes: 0.01 10*3/uL (ref 0.00–0.07)
Basophils Absolute: 0 10*3/uL (ref 0.0–0.1)
Basophils Relative: 1 %
Eosinophils Absolute: 0.1 10*3/uL (ref 0.0–0.5)
Eosinophils Relative: 2 %
HCT: 42.9 % (ref 39.0–52.0)
Hemoglobin: 13.8 g/dL (ref 13.0–17.0)
Immature Granulocytes: 0 %
Lymphocytes Relative: 29 %
Lymphs Abs: 1.6 10*3/uL (ref 0.7–4.0)
MCH: 26.7 pg (ref 26.0–34.0)
MCHC: 32.2 g/dL (ref 30.0–36.0)
MCV: 83.1 fL (ref 80.0–100.0)
Monocytes Absolute: 0.4 10*3/uL (ref 0.1–1.0)
Monocytes Relative: 8 %
Neutro Abs: 3.3 10*3/uL (ref 1.7–7.7)
Neutrophils Relative %: 60 %
Platelets: 283 10*3/uL (ref 150–400)
RBC: 5.16 MIL/uL (ref 4.22–5.81)
RDW: 14.1 % (ref 11.5–15.5)
WBC: 5.4 10*3/uL (ref 4.0–10.5)
nRBC: 0 % (ref 0.0–0.2)

## 2023-02-03 LAB — RAPID URINE DRUG SCREEN, HOSP PERFORMED
Amphetamines: NOT DETECTED
Barbiturates: NOT DETECTED
Benzodiazepines: NOT DETECTED
Cocaine: NOT DETECTED
Opiates: NOT DETECTED
Tetrahydrocannabinol: NOT DETECTED

## 2023-02-03 LAB — COMPREHENSIVE METABOLIC PANEL
ALT: 16 U/L (ref 0–44)
AST: 23 U/L (ref 15–41)
Albumin: 3.7 g/dL (ref 3.5–5.0)
Alkaline Phosphatase: 148 U/L — ABNORMAL HIGH (ref 38–126)
Anion gap: 13 (ref 5–15)
BUN: 5 mg/dL — ABNORMAL LOW (ref 6–20)
CO2: 22 mmol/L (ref 22–32)
Calcium: 6.9 mg/dL — ABNORMAL LOW (ref 8.9–10.3)
Chloride: 105 mmol/L (ref 98–111)
Creatinine, Ser: 0.69 mg/dL (ref 0.61–1.24)
GFR, Estimated: >60 mL/min (ref >60)
Glucose, Bld: 112 mg/dL — ABNORMAL HIGH (ref 70–99)
Potassium: 3 mmol/L — ABNORMAL LOW (ref 3.5–5.1)
Sodium: 140 mmol/L (ref 135–145)
Total Bilirubin: 0.7 mg/dL (ref 0.3–1.2)
Total Protein: 7.2 g/dL (ref 6.5–8.1)

## 2023-02-03 LAB — PHOSPHORUS: Phosphorus: 2.9 mg/dL (ref 2.5–4.6)

## 2023-02-03 LAB — MRSA NEXT GEN BY PCR, NASAL: MRSA by PCR Next Gen: NOT DETECTED

## 2023-02-03 LAB — ETHANOL: Alcohol, Ethyl (B): 104 mg/dL — ABNORMAL HIGH (ref <10)

## 2023-02-03 LAB — VITAMIN D 25 HYDROXY (VIT D DEFICIENCY, FRACTURES): Vit D, 25-Hydroxy: <4.2 ng/mL — ABNORMAL LOW (ref 30–100)

## 2023-02-03 MED ORDER — LORAZEPAM 1 MG PO TABS
1.0000 mg | ORAL_TABLET | ORAL | Status: DC | PRN
  Administered 2023-02-03: 2 mg via ORAL
  Filled 2023-02-03: qty 2

## 2023-02-03 MED ORDER — ONDANSETRON HCL 4 MG/2ML IJ SOLN: 4.0000 mg | Freq: Four times a day (QID) | INTRAMUSCULAR | Status: DC | PRN

## 2023-02-03 MED ORDER — CHLORHEXIDINE GLUCONATE CLOTH 2 % EX PADS
6.0000 | MEDICATED_PAD | Freq: Every day | CUTANEOUS | Status: DC
  Administered 2023-02-03: 6 via TOPICAL

## 2023-02-03 MED ORDER — ACETAMINOPHEN 650 MG RE SUPP: 650.0000 mg | Freq: Four times a day (QID) | RECTAL | Status: DC | PRN

## 2023-02-03 MED ORDER — ONDANSETRON HCL 4 MG PO TABS: 4.0000 mg | ORAL_TABLET | Freq: Four times a day (QID) | ORAL | Status: DC | PRN

## 2023-02-03 MED ORDER — LORAZEPAM 2 MG/ML IJ SOLN
1.0000 mg | INTRAMUSCULAR | Status: DC | PRN
  Administered 2023-02-03 (×2): 4 mg via INTRAVENOUS
  Administered 2023-02-03 (×2): 2 mg via INTRAVENOUS
  Filled 2023-02-03 (×2): qty 2
  Filled 2023-02-03 (×2): qty 1

## 2023-02-03 MED ORDER — LORAZEPAM 1 MG PO TABS: 1.0000 mg | ORAL_TABLET | ORAL | Status: DC | PRN

## 2023-02-03 MED ORDER — POTASSIUM CHLORIDE IN NACL 20-0.9 MEQ/L-% IV SOLN
INTRAVENOUS | Status: AC
  Filled 2023-02-03: qty 1000

## 2023-02-03 MED ORDER — AMLODIPINE BESYLATE 5 MG PO TABS
5.0000 mg | ORAL_TABLET | Freq: Every day | ORAL | Status: DC
  Administered 2023-02-03 – 2023-02-04 (×2): 5 mg via ORAL
  Filled 2023-02-03 (×2): qty 1

## 2023-02-03 MED ORDER — LORAZEPAM 1 MG PO TABS: 0.0000 mg | ORAL_TABLET | Freq: Three times a day (TID) | ORAL | Status: DC

## 2023-02-03 MED ORDER — MAGNESIUM OXIDE -MG SUPPLEMENT 400 (240 MG) MG PO TABS
800.0000 mg | ORAL_TABLET | Freq: Once | ORAL | Status: AC
  Administered 2023-02-03: 800 mg via ORAL
  Filled 2023-02-03: qty 2

## 2023-02-03 MED ORDER — ACETAMINOPHEN 325 MG PO TABS
650.0000 mg | ORAL_TABLET | Freq: Four times a day (QID) | ORAL | Status: DC | PRN
  Administered 2023-02-03: 650 mg via ORAL
  Filled 2023-02-03: qty 2

## 2023-02-03 MED ORDER — ENOXAPARIN SODIUM 40 MG/0.4ML IJ SOSY
40.0000 mg | PREFILLED_SYRINGE | INTRAMUSCULAR | Status: DC
  Administered 2023-02-03: 40 mg via SUBCUTANEOUS
  Filled 2023-02-03: qty 0.4

## 2023-02-03 MED ORDER — THIAMINE HCL 100 MG/ML IJ SOLN: 100.0000 mg | Freq: Every day | INTRAMUSCULAR | Status: DC

## 2023-02-03 MED ORDER — THIAMINE MONONITRATE 100 MG PO TABS
100.0000 mg | ORAL_TABLET | Freq: Every day | ORAL | Status: DC
  Administered 2023-02-03 – 2023-02-04 (×2): 100 mg via ORAL
  Filled 2023-02-03 (×2): qty 1

## 2023-02-03 MED ORDER — ADULT MULTIVITAMIN W/MINERALS CH
1.0000 | ORAL_TABLET | Freq: Every day | ORAL | Status: DC
  Administered 2023-02-03 – 2023-02-04 (×2): 1 via ORAL
  Filled 2023-02-03 (×2): qty 1

## 2023-02-03 MED ORDER — POTASSIUM CHLORIDE CRYS ER 20 MEQ PO TBCR
40.0000 meq | EXTENDED_RELEASE_TABLET | Freq: Every day | ORAL | Status: AC
  Administered 2023-02-03: 40 meq via ORAL
  Filled 2023-02-03: qty 2

## 2023-02-03 MED ORDER — LORAZEPAM 2 MG/ML IJ SOLN
1.0000 mg | Freq: Once | INTRAMUSCULAR | Status: AC
  Administered 2023-02-03: 1 mg via INTRAVENOUS
  Filled 2023-02-03: qty 1

## 2023-02-03 MED ORDER — POTASSIUM CHLORIDE CRYS ER 20 MEQ PO TBCR
40.0000 meq | EXTENDED_RELEASE_TABLET | Freq: Once | ORAL | Status: AC
  Administered 2023-02-03: 40 meq via ORAL
  Filled 2023-02-03: qty 2

## 2023-02-03 MED ORDER — FOLIC ACID 1 MG PO TABS
1.0000 mg | ORAL_TABLET | Freq: Every day | ORAL | Status: DC
  Administered 2023-02-03 – 2023-02-04 (×2): 1 mg via ORAL
  Filled 2023-02-03 (×2): qty 1

## 2023-02-03 MED ORDER — VITAMIN D (ERGOCALCIFEROL) 1.25 MG (50000 UNIT) PO CAPS
50000.0000 [IU] | ORAL_CAPSULE | ORAL | Status: DC
  Administered 2023-02-03: 50000 [IU] via ORAL
  Filled 2023-02-03: qty 1

## 2023-02-03 MED ORDER — MELATONIN 5 MG PO TABS
5.0000 mg | ORAL_TABLET | Freq: Once | ORAL | Status: AC
  Administered 2023-02-03: 5 mg via ORAL
  Filled 2023-02-03: qty 1

## 2023-02-03 MED ORDER — LACTATED RINGERS IV BOLUS
1000.0000 mL | Freq: Once | INTRAVENOUS | Status: AC
  Administered 2023-02-03: 1000 mL via INTRAVENOUS

## 2023-02-03 MED ORDER — LORAZEPAM 1 MG PO TABS
0.0000 mg | ORAL_TABLET | ORAL | Status: DC
  Administered 2023-02-03 (×2): 2 mg via ORAL
  Administered 2023-02-03: 4 mg via ORAL
  Administered 2023-02-03: 2 mg via ORAL
  Administered 2023-02-04: 3 mg via ORAL
  Administered 2023-02-04: 2 mg via ORAL
  Filled 2023-02-03 (×3): qty 2
  Filled 2023-02-03: qty 4
  Filled 2023-02-03: qty 2
  Filled 2023-02-03: qty 3

## 2023-02-03 MED ORDER — CARIPRAZINE HCL 1.5 MG PO CAPS
1.5000 mg | ORAL_CAPSULE | Freq: Every day | ORAL | Status: DC
  Administered 2023-02-03 – 2023-02-04 (×2): 1.5 mg via ORAL
  Filled 2023-02-03 (×2): qty 1

## 2023-02-03 NOTE — ED Provider Notes (Signed)
[place] EMERGENCY DEPARTMENT AT [facility] Provider Note  CSN: [id number] Arrival date & time: 02/03/23 1191  Chief Complaint(s) Alcohol Problem  HPI [name] is a 52 y.o. male with PMH alcohol dependence and multiple hospitalizations for alcohol withdrawal, polysubstance abuse, PTSD, bipolar 1 disorder who presents emergency department for evaluation of tremors and alcohol withdrawal.  He states that last drink was approximately 12 hours ago.  He denies vomiting or current nausea.  Denies chest pain, shortness of breath, headache, fever or other systemic symptoms.  Patient does arrive tremulous and hypertensive.   Past Medical History Past Medical History:  Diagnosis Date   Alcohol dependence with withdrawal (HCC)    Anxiety    Arrhythmia    Asthma    Atrial fibrillation (HCC)    not on AC   Bipolar 1 disorder (HCC)    Class 2 obesity    Cocaine abuse (HCC)    Depression    Heart palpitations    with severe anxiety   Hyperlipidemia    Hypertension    PTSD (post-traumatic stress disorder)    PTSD (post-traumatic stress disorder)    Syncope and collapse    Patient Active Problem List   Diagnosis Date Noted   Chest pain 11/16/2022   ETOH abuse 07/09/2022   Malingering 09/19/2020   Alcohol dependence with alcohol-induced mood disorder (HCC)    Alcohol withdrawal (HCC) 08/22/2020   Bipolar 1 disorder (HCC) 06/20/2020   MDD (major depressive disorder), recurrent, severe, with psychosis (HCC) 06/20/2020   Cocaine abuse (HCC) 06/14/2020   Class 2 obesity due to excess calories with body mass index (BMI) of 39.0 to 39.9 in adult 06/14/2020   Alcohol abuse with alcohol-induced mood disorder (HCC) 01/18/2017   Major depressive disorder, recurrent, severe without psychotic features (HCC)    Cocaine dependence with cocaine-induced mood disorder (HCC)    Alcohol use disorder, severe, dependence (HCC) 04/12/2015   Hypertension 09/08/2011   Post traumatic stress  disorder (PTSD) 09/08/2011   Home Medication(s) Prior to Admission medications   Medication Sig Start Date End Date Taking? Authorizing Provider  amLODipine (NORVASC) 5 MG tablet Take 1 tablet (5 mg total) by mouth daily. 01/05/23 02/04/23  Zigmund Daniel., MD                                                                                                                                    Past Surgical History Past Surgical History:  Procedure Laterality Date   NO PAST SURGERIES     Family History Family History  Problem Relation Age of Onset   Alcohol abuse Mother    Heart disease Mother    Hypertension Father    Alcohol abuse Maternal Grandfather     Social History Social History   Tobacco Use   Smoking status: Never    Passive exposure: Current (Indoor smoking)   Smokeless tobacco: Never  Vaping  Use   Vaping Use: Never used  Substance Use Topics   Alcohol use: Not Currently   Drug use: Not Currently    Types: Cocaine   Allergies Metoprolol  Review of Systems Review of Systems  Neurological:  Positive for tremors.    Physical Exam Vital Signs  I have reviewed the triage vital signs BP (!) 156/97   Pulse (!) 104   Temp 98 F (36.7 C) (Oral)   Resp 19   Ht 5\' 7"  (1.702 m)   Wt 113.4 kg   SpO2 96%   BMI 39.16 kg/m   Physical Exam Constitutional:      General: He is not in acute distress.    Appearance: Normal appearance.  HENT:     Head: Normocephalic and atraumatic.     Nose: No congestion or rhinorrhea.  Eyes:     General:        Right eye: No discharge.        Left eye: No discharge.     Extraocular Movements: Extraocular movements intact.     Pupils: Pupils are equal, round, and reactive to light.  Cardiovascular:     Rate and Rhythm: Normal rate and regular rhythm.     Heart sounds: No murmur heard. Pulmonary:     Effort: No respiratory distress.     Breath sounds: No wheezing or rales.  Abdominal:     General: There is no  distension.     Tenderness: There is no abdominal tenderness.  Musculoskeletal:        General: Normal range of motion.     Cervical back: Normal range of motion.  Skin:    General: Skin is warm and dry.  Neurological:     General: No focal deficit present.     Mental Status: He is alert.     Comments: Tremors     ED Results and Treatments Labs (all labs ordered are listed, but only abnormal results are displayed) Labs Reviewed  COMPREHENSIVE METABOLIC PANEL  CBC WITH DIFFERENTIAL/PLATELET  ETHANOL  RAPID URINE DRUG SCREEN, HOSP PERFORMED                                                                                                                          Radiology No results found.  Pertinent labs & imaging results that were available during my care of the patient were reviewed by me and considered in my medical decision making (see MDM for details).  Medications Ordered in ED Medications  LORazepam (ATIVAN) tablet 1-4 mg (2 mg Oral Given 02/03/23 0837)    Or  LORazepam (ATIVAN) tablet 1 mg ( Oral See Alternative 02/03/23 0837)  thiamine (VITAMIN B1) tablet 100 mg (100 mg Oral Given 02/03/23 0836)    Or  thiamine (VITAMIN B1) injection 100 mg ( Intravenous See Alternative 02/03/23 0836)  folic acid (FOLVITE) tablet 1 mg (1 mg Oral Given 02/03/23 0836)  multivitamin with minerals tablet 1 tablet (1 tablet Oral  Given 02/03/23 0836)  lactated ringers bolus 1,000 mL (1,000 mLs Intravenous New Bag/Given 02/03/23 0981)                                                                                                                                     Procedures Procedures  (including critical care time)  Medical Decision Making / ED Course   This patient presents to the ED for concern of alcohol withdrawal, tremors, this involves an extensive number of treatment options, and is a complaint that carries with it a high risk of complications and morbidity.  The differential diagnosis  includes alcohol withdrawal, polysubstance use, thiamine deficiency, electrolyte abnormality, dehydration  MDM: Patient seen emergency room for evaluation of alcohol withdrawal.  Physical exam reveals a tremulous appearing patient with tachycardia, multiple healed and scarred over self-inflicted lacerations over the dorsal aspect of bilateral forearms.  Laboratory evaluation with a hypokalemia to 3.0, alk phos 148, alcohol level 104.  ECG was sinus tachycardia.  CIWA protocol initiated and folate and thiamine repleted as well as 10 mg p.o. Ativan given and 1 L lactated Ringer's.  On reevaluation, patient remains tremulous and will receive 1 mg of IV Ativan.  Patient require hospital admission for alcohol withdrawal.  Patient admitted.   Additional history obtained:  -External records from outside source obtained and reviewed including: Chart review including previous notes, labs, imaging, consultation notes   Lab Tests: -I ordered, reviewed, and interpreted labs.   The pertinent results include:   Labs Reviewed  COMPREHENSIVE METABOLIC PANEL  CBC WITH DIFFERENTIAL/PLATELET  ETHANOL  RAPID URINE DRUG SCREEN, HOSP PERFORMED      EKG   EKG Interpretation  Date/Time:  Tuesday February 03 2023 08:20:58 EDT Ventricular Rate:  103 PR Interval:  188 QRS Duration: 103 QT Interval:  345 QTC Calculation: 452 R Axis:   -33 Text Interpretation: Sinus tachycardia Left axis deviation Anterior infarct, old Confirmed by Bluma Buresh ([id number]) on 02/03/2023 8:37:44 AM         Medicines ordered and prescription drug management: Meds ordered this encounter  Medications   OR Linked Order Group    LORazepam (ATIVAN) tablet 1-4 mg     Order Specific Question:   CIWA-AR < 5 =     Answer:   0 mg     Order Specific Question:   CIWA-AR 5 -10 =     Answer:   1 mg     Order Specific Question:   CIWA-AR 11 -15 =     Answer:   2 mg     Order Specific Question:   CIWA-AR 16 -20 =     Answer:   3 mg      Order Specific Question:   CIWA-AR 16 -20 =     Answer:   Recheck CIWA-AR in 1 hour; if > 20 notify MD     Order Specific Question:   CIWA-AR >  20 =     Answer:   4 mg     Order Specific Question:   CIWA-AR > 20 =     Answer:   Call Rapid Response    LORazepam (ATIVAN) tablet 1 mg   OR Linked Order Group    thiamine (VITAMIN B1) tablet 100 mg    thiamine (VITAMIN B1) injection 100 mg   folic acid (FOLVITE) tablet 1 mg   multivitamin with minerals tablet 1 tablet   lactated ringers bolus 1,000 mL    -I have reviewed the patients home medicines and have made adjustments as needed  Critical interventions none    Cardiac Monitoring: The patient was maintained on a cardiac monitor.  I personally viewed and interpreted the cardiac monitored which showed an underlying rhythm of: Sinus tachycardia  Social Determinants of Health:  Factors impacting patients care include: Daily alcohol use   Reevaluation: After the interventions noted above, I reevaluated the patient and found that they have :improved  Co morbidities that complicate the patient evaluation  Past Medical History:  Diagnosis Date   Alcohol dependence with withdrawal (HCC)    Anxiety    Arrhythmia    Asthma    Atrial fibrillation (HCC)    not on AC   Bipolar 1 disorder (HCC)    Class 2 obesity    Cocaine abuse (HCC)    Depression    Heart palpitations    with severe anxiety   Hyperlipidemia    Hypertension    PTSD (post-traumatic stress disorder)    PTSD (post-traumatic stress disorder)    Syncope and collapse       Dispostion: I considered admission for this patient, and due to alcohol withdrawal patient require hospital admission     Final Clinical Impression(s) / ED Diagnoses Final diagnoses:  None     @PCDICTATION @    Glendora Score, MD 02/03/23 [phone]

## 2023-02-03 NOTE — Progress Notes (Signed)
[facility] admitting physician addendum:  The case was briefly presented to and discussed with [facility]. They will be seeing him tomorrow. They have recommended to restart cariprazine 1.5 mg po daily as he has had good results with it in the past, but has not been compliant with it.  Sanda Klein, MD

## 2023-02-03 NOTE — Consult Note (Signed)
[facility] Face-to-Face Psychiatry Consult   Reason for Consult:  Alcohol withdraw, off medications Referring Physician:  Dr. Robb Matar Patient Identification: [name] MRN:  [id number] Principal Diagnosis: Alcohol withdrawal (HCC) Diagnosis:  Principal Problem:   Alcohol withdrawal (HCC) Active Problems:   Hypertension   Post traumatic stress disorder (PTSD)   Alcohol use disorder, severe, dependence (HCC)   Cocaine abuse (HCC)   Class 2 obesity due to excess calories with body mass index (BMI) of 39.0 to 39.9 in adult   Bipolar 1 disorder (HCC)   Hypomagnesemia   Hypokalemia   Hypocalcemia   OSA (obstructive sleep apnea)   Total Time spent with patient: 1 hour  Subjective:   [name] is a 52 y.o. male patient admitted with alcohol withdraw symptoms.  He states he came to the ED as he was experiencing alcohol withdraw complications and needed to seek medical attention. He endorses beginning to feel better while in the ED, but was medically admitted for low potassium. He states he feels as if things have changed for him, and mentally has been doing "ok". He states since he has abstained from cocaine use, his mood has improved but also contributes his increase in alcohol to decrease in illicit substance use. Briefly reviewed the phases of addiction, recovery and rehab. He reports compliance with his medication, however it can be costly at times. He states his Leafy Kindle works great, and also contributes this medication to the reason for for his reduction in ER visits and psychotic episodes.   During evaluation [name] is sitting on side of bed in no acute distress. He is observed to be eating an omelet. He is open to psychiatric evaluation at this time, and agrees to eat and talk. He is alert, oriented x 4, calm and cooperative.  His mood is euthymic with congruent affect.  He does not appear to be responding to internal/external stimuli or delusional thoughts.  Patient denies  suicidal/self-harm/homicidal ideation, psychosis, and paranoia.  Patient answered question appropriately.   He is congratulated on his progress and reduction of illicit substances. He is open to resuming his Vraylar, and would like a rx upon discharge. He currently denies any depressive symptoms, anxiety, mania, psychosis, hallucinations, or trauma related stressors. His clinical presentation is consistent with the above symptoms, he has linear thought process and answers all questions appropriately. He denies any acute withdrawal symptoms at this time, and states "the food is good". Although he has been without his Behavioral health meds, clinically he is at his baseline and shows no acute psychiatric concerns. Patients current symptoms do not reflect the need for inpatient psychiatric hospitalization.   HPI:  [name] is a 52 y.o. male with PMH alcohol dependence and multiple hospitalizations for alcohol withdrawal, polysubstance abuse, PTSD, bipolar 1 disorder who presents emergency department for evaluation of tremors and alcohol withdrawal.  He states that last drink was approximately 12 hours ago.  He denies vomiting or current nausea.  Denies chest pain, shortness of breath, headache, fever or other systemic symptoms.  Patient does arrive tremulous and hypertensive.    Past Psychiatric History: Alcohol use d/o, cocaine use d/o, Bipolar 1, and Depression.   Risk to Self:   Denies Risk to Others:   Denies Prior Inpatient Therapy:   [facility] in 2019  Prior Outpatient Therapy:   None established currently, has been utilizing ED for management   Past Medical History:  Past Medical History:  Diagnosis Date  Alcohol dependence with withdrawal (HCC)    Anxiety    Arrhythmia    Asthma    Atrial fibrillation (HCC)    not on AC   Bipolar 1 disorder (HCC)    Class 2 obesity    Cocaine abuse (HCC)    Depression    Heart palpitations    with severe anxiety    Hyperlipidemia    Hypertension    PTSD (post-traumatic stress disorder)    PTSD (post-traumatic stress disorder)    Syncope and collapse     Past Surgical History:  Procedure Laterality Date   NO PAST SURGERIES     Family History:  Family History  Problem Relation Age of Onset   Alcohol abuse Mother    Heart disease Mother    Hypertension Father    Alcohol abuse Maternal Grandfather    Family Psychiatric  History: Father diagnosed with dementia, alcohol use diorder with his mother.   Social History:  Social History   Substance and Sexual Activity  Alcohol Use Not Currently     Social History   Substance and Sexual Activity  Drug Use Not Currently   Types: Cocaine    Social History   Socioeconomic History   Marital status: Single    Spouse name: Not on file   Number of children: Not on file   Years of education: Not on file   Highest education level: Not on file  Occupational History   Occupation: disabled  Tobacco Use   Smoking status: Never    Passive exposure: Current (Indoor smoking)   Smokeless tobacco: Never  Vaping Use   Vaping Use: Never used  Substance and Sexual Activity   Alcohol use: Not Currently   Drug use: Not Currently    Types: Cocaine   Sexual activity: Not Currently    Comment: pt said he has an enlarged prostate  Other Topics Concern   Not on file  Social History Narrative   ** Merged History Encounter **       Social Determinants of Health   Financial Resource Strain: Not on file  Food Insecurity: Food Insecurity Present (02/03/2023)   Hunger Vital Sign    Worried About Running Out of Food in the Last Year: Often true    Ran Out of Food in the Last Year: Often true  Transportation Needs: Unmet Transportation Needs (02/03/2023)   PRAPARE - Administrator, Civil Service (Medical): Yes    Lack of Transportation (Non-Medical): Yes  Physical Activity: Not on file  Stress: Not on file  Social Connections: Not on file    Additional Social History:    Allergies:   Allergies  Allergen Reactions   Metoprolol Other (See Comments)    Prostate problem    Labs:  Results for orders placed or performed during the hospital encounter of 02/03/23 (from the past 48 hour(s))  Comprehensive metabolic panel     Status: Abnormal   Collection Time: 02/03/23  8:29 AM  Result Value Ref Range   Sodium 140 135 - 145 mmol/L   Potassium 3.0 (L) 3.5 - 5.1 mmol/L   Chloride 105 98 - 111 mmol/L   CO2 22 22 - 32 mmol/L   Glucose, Bld 112 (H) 70 - 99 mg/dL    Comment: Glucose reference range applies only to samples taken after fasting for at least 8 hours.   BUN 5 (L) 6 - 20 mg/dL   Creatinine, Ser 4.09 0.61 - 1.24 mg/dL   Calcium  6.9 (L) 8.9 - 10.3 mg/dL   Total Protein 7.2 6.5 - 8.1 g/dL   Albumin 3.7 3.5 - 5.0 g/dL   AST 23 15 - 41 U/L   ALT 16 0 - 44 U/L   Alkaline Phosphatase 148 (H) 38 - 126 U/L   Total Bilirubin 0.7 0.3 - 1.2 mg/dL   GFR, Estimated >16 >10 mL/min    Comment: (NOTE) Calculated using the CKD-EPI Creatinine Equation (2021)    Anion gap 13 5 - 15    Comment: Performed at [facility], [address] [name], [place] [address]  CBC with Differential     Status: None   Collection Time: 02/03/23  8:29 AM  Result Value Ref Range   WBC 5.4 4.0 - 10.5 K/uL   RBC 5.16 4.22 - 5.81 MIL/uL   Hemoglobin 13.8 13.0 - 17.0 g/dL   HCT 40.9 81.1 - 91.4 %   MCV 83.1 80.0 - 100.0 fL   MCH 26.7 26.0 - 34.0 pg   MCHC 32.2 30.0 - 36.0 g/dL   RDW 78.2 95.6 - 21.3 %   Platelets 283 150 - 400 K/uL   nRBC 0.0 0.0 - 0.2 %   Neutrophils Relative % 60 %   Neutro Abs 3.3 1.7 - 7.7 K/uL   Lymphocytes Relative 29 %   Lymphs Abs 1.6 0.7 - 4.0 K/uL   Monocytes Relative 8 %   Monocytes Absolute 0.4 0.1 - 1.0 K/uL   Eosinophils Relative 2 %   Eosinophils Absolute 0.1 0.0 - 0.5 K/uL   Basophils Relative 1 %   Basophils Absolute 0.0 0.0 - 0.1 K/uL   Immature Granulocytes 0 %   Abs Immature  Granulocytes 0.01 0.00 - 0.07 K/uL    Comment: Performed at [facility], [address], [place], [place] [address]  Ethanol     Status: Abnormal   Collection Time: 02/03/23  8:29 AM  Result Value Ref Range   Alcohol, Ethyl (B) 104 (H) <10 mg/dL    Comment: (NOTE) Lowest detectable limit for serum alcohol is 10 mg/dL.  For medical purposes only. Performed at [facility], [address], [place], [place] [address]   Rapid urine drug screen (hospital performed)     Status: None   Collection Time: 02/03/23 10:46 AM  Result Value Ref Range   Opiates NONE DETECTED NONE DETECTED   Cocaine NONE DETECTED NONE DETECTED   Benzodiazepines NONE DETECTED NONE DETECTED   Amphetamines NONE DETECTED NONE DETECTED   Tetrahydrocannabinol NONE DETECTED NONE DETECTED   Barbiturates NONE DETECTED NONE DETECTED    Comment: (NOTE) DRUG SCREEN FOR MEDICAL PURPOSES ONLY.  IF CONFIRMATION IS NEEDED FOR ANY PURPOSE, NOTIFY LAB WITHIN 5 DAYS.  LOWEST DETECTABLE LIMITS FOR URINE DRUG SCREEN Drug Class                     Cutoff (ng/mL) Amphetamine and metabolites    1000 Barbiturate and metabolites    200 Benzodiazepine                 200 Opiates and metabolites        300 Cocaine and metabolites        300 THC                            50 Performed at [facility], [address] [place], [place] [address]  Urinalysis, Routine w reflex microscopic -Urine, Clean Catch     Status: Abnormal   Collection Time: 02/03/23 10:46 AM  Result Value Ref Range   Color, Urine STRAW (A) YELLOW   APPearance CLEAR CLEAR   Specific Gravity, Urine 1.008 1.005 - 1.030   pH 8.0 5.0 - 8.0   Glucose, UA NEGATIVE NEGATIVE mg/dL   Hgb urine dipstick SMALL (A) NEGATIVE   Bilirubin Urine NEGATIVE NEGATIVE   Ketones, ur 5 (A) NEGATIVE mg/dL   Protein, ur NEGATIVE NEGATIVE mg/dL   Nitrite NEGATIVE NEGATIVE   Leukocytes,Ua NEGATIVE NEGATIVE   RBC / HPF 0-5 0  - 5 RBC/hpf   WBC, UA 0-5 0 - 5 WBC/hpf   Bacteria, UA RARE (A) NONE SEEN   Squamous Epithelial / HPF 0-5 0 - 5 /HPF   Mucus PRESENT     Comment: Performed at [facility], [address] [place], [place] [address]  Phosphorus     Status: None   Collection Time: 02/03/23 10:46 AM  Result Value Ref Range   Phosphorus 2.9 2.5 - 4.6 mg/dL    Comment: Performed at [facility], [address], [place], [place] [address]  VITAMIN D 25 Hydroxy (Vit-D Deficiency, Fractures)     Status: Abnormal   Collection Time: 02/03/23 10:46 AM  Result Value Ref Range   Vit D, 25-Hydroxy <4.20 (L) 30 - 100 ng/mL    Comment: (NOTE) Vitamin D deficiency has been defined by the Institute of Medicine  and an Endocrine Society practice guideline as a level of serum 25-OH  vitamin D less than 20 ng/mL (1,2). The Endocrine Society went on to  further define vitamin D insufficiency as a level between 21 and 29  ng/mL (2).  1. IOM (Institute of Medicine). 2010. Dietary reference intakes for  calcium and D. [place]: [employer]. 2. Holick MF, Binkley [place], Bischoff-Ferrari HA, et al. Evaluation,  treatment, and prevention of vitamin D deficiency: an Endocrine  Society clinical practice guideline, JCEM. 2011 Jul; 96(7): 1911-30.  Performed at [facility], [address], [place], [place] [address]   MRSA Next Gen by PCR, Nasal     Status: None   Collection Time: 02/03/23  3:08 PM   Specimen: Nasal Mucosa; Nasal Swab  Result Value Ref Range   MRSA by PCR Next Gen NOT DETECTED NOT DETECTED    Comment: (NOTE) The GeneXpert MRSA Assay (FDA approved for NASAL specimens only), is one component of a comprehensive MRSA colonization surveillance program. It is not intended to diagnose MRSA infection nor to guide or monitor treatment for MRSA infections. Test performance is not FDA approved in patients less than 39 years old. Performed at [facility], [address] [place], [place] [address]   Comprehensive metabolic panel     Status: Abnormal   Collection Time: 02/04/23  3:18 AM  Result Value Ref Range   Sodium 138 135 - 145 mmol/L   Potassium 3.3 (L) 3.5 - 5.1 mmol/L   Chloride 105 98 - 111 mmol/L   CO2 25 22 - 32 mmol/L   Glucose, Bld 119 (H) 70 - 99 mg/dL    Comment: Glucose reference range applies only to samples taken after fasting for at least 8 hours.   BUN 10 6 - 20 mg/dL   Creatinine, Ser 0.10 (L) 0.61 - 1.24 mg/dL   Calcium 8.2 (L) 8.9 - 10.3 mg/dL   Total Protein 6.2 (L)  6.5 - 8.1 g/dL   Albumin 3.2 (L) 3.5 - 5.0 g/dL   AST 19 15 - 41 U/L   ALT 14 0 - 44 U/L   Alkaline Phosphatase 142 (H) 38 - 126 U/L   Total Bilirubin 0.7 0.3 - 1.2 mg/dL   GFR, Estimated >16 >10 mL/min    Comment: (NOTE) Calculated using the CKD-EPI Creatinine Equation (2021)    Anion gap 8 5 - 15    Comment: Performed at [facility], [address] [place], [place] [address]  CBC     Status: Abnormal   Collection Time: 02/04/23  3:18 AM  Result Value Ref Range   WBC 5.8 4.0 - 10.5 K/uL   RBC 4.54 4.22 - 5.81 MIL/uL   Hemoglobin 12.4 (L) 13.0 - 17.0 g/dL   HCT 40.9 (L) 81.1 - 91.4 %   MCV 84.6 80.0 - 100.0 fL   MCH 27.3 26.0 - 34.0 pg   MCHC 32.3 30.0 - 36.0 g/dL   RDW 78.2 95.6 - 21.3 %   Platelets 226 150 - 400 K/uL   nRBC 0.0 0.0 - 0.2 %    Comment: Performed at [facility], [address] [place], [place] [address]    Current Facility-Administered Medications  Medication Dose Route Frequency Provider Last Rate Last Admin   acetaminophen (TYLENOL) tablet 650 mg  650 mg Oral Q6H PRN Bobette Mo, MD   650 mg at 02/03/23 2302   Or   acetaminophen (TYLENOL) suppository 650 mg  650 mg Rectal Q6H PRN Bobette Mo, MD       amLODipine (NORVASC) tablet 5 mg  5 mg Oral Daily Bobette Mo, MD   5 mg at 02/03/23 1057   cariprazine (VRAYLAR) capsule 1.5 mg   1.5 mg Oral Daily Bobette Mo, MD   1.5 mg at 02/03/23 1351   Chlorhexidine Gluconate Cloth 2 % PADS 6 each  6 each Topical Daily Bobette Mo, MD   6 each at 02/03/23 1226   enoxaparin (LOVENOX) injection 40 mg  40 mg Subcutaneous Q24H Bobette Mo, MD   40 mg at 02/03/23 2105   folic acid (FOLVITE) tablet 1 mg  1 mg Oral Daily Kommor, Madison, MD   1 mg at 02/03/23 0836   LORazepam (ATIVAN) tablet 1-4 mg  1-4 mg Oral Q1H PRN Bobette Mo, MD       Or   LORazepam (ATIVAN) injection 1-4 mg  1-4 mg Intravenous Q1H PRN Bobette Mo, MD   4 mg at 02/03/23 2105   LORazepam (ATIVAN) tablet 0-4 mg  0-4 mg Oral Q4H Bobette Mo, MD   3 mg at 02/04/23 0502   Followed by   Melene Muller ON 02/05/2023] LORazepam (ATIVAN) tablet 0-4 mg  0-4 mg Oral Q8H Bobette Mo, MD       multivitamin with minerals tablet 1 tablet  1 tablet Oral Daily Kommor, Madison, MD   1 tablet at 02/03/23 0836   ondansetron (ZOFRAN) tablet 4 mg  4 mg Oral Q6H PRN Bobette Mo, MD       Or   ondansetron [facility]) injection 4 mg  4 mg Intravenous Q6H PRN Bobette Mo, MD       thiamine (VITAMIN B1) tablet 100 mg  100 mg Oral Daily Kommor, Madison, MD   100 mg at 02/03/23 8469   Or   thiamine (VITAMIN B1) injection 100 mg  100 mg Intravenous  Daily Kommor, Madison, MD       Vitamin D (Ergocalciferol) (DRISDOL) 1.25 MG (50000 UNIT) capsule 50,000 Units  50,000 Units Oral Q7 days Bobette Mo, MD   50,000 Units at 02/03/23 1718    Musculoskeletal: Strength & Muscle Tone: within normal limits Gait & Station: normal Patient leans: N/A            Psychiatric Specialty Exam:  Presentation  General Appearance: Appropriate for Environment; Casual  Eye Contact:Good  Speech:Clear and Coherent; Normal Rate  Speech Volume:Normal  Handedness:Right   Mood and Affect  Mood:Euthymic  Affect:Congruent; Appropriate   Thought Process  Thought  Processes:Coherent; Linear  Descriptions of Associations:Intact  Orientation:Full (Time, Place and Person)  Thought Content:WDL  History of Schizophrenia/Schizoaffective disorder:No data recorded Duration of Psychotic Symptoms:No data recorded Hallucinations:Hallucinations: None  Ideas of Reference:None  Suicidal Thoughts:Suicidal Thoughts: No  Homicidal Thoughts:Homicidal Thoughts: No   Sensorium  Memory:Immediate Good; Recent Good; Remote Good  Judgment:Good  Insight:Fair   Executive Functions  Concentration:Good  Attention Span:Good  Recall:Good  Fund of Knowledge:Good  Language:Good   Psychomotor Activity  Psychomotor Activity:Psychomotor Activity: Normal   Assets  Assets:Communication Skills; Desire for Improvement; Financial Resources/Insurance; Social Support   Sleep  Sleep:Sleep: Fair   Physical Exam: Physical Exam Vitals and nursing note reviewed.  Constitutional:      Appearance: Normal appearance. He is obese.  Skin:    General: Skin is warm.  Neurological:     General: No focal deficit present.     Mental Status: He is alert and oriented to person, place, and time. Mental status is at baseline.  Psychiatric:        Mood and Affect: Mood normal.        Behavior: Behavior normal.        Thought Content: Thought content normal.        Judgment: Judgment normal.    Review of Systems  Psychiatric/Behavioral:  Positive for depression and substance abuse.   All other systems reviewed and are negative.  Blood pressure (!) 137/52, pulse 80, temperature 98.3 F (36.8 C), resp. rate 20, height 5\' 7"  (1.702 m), weight 113.4 kg, SpO2 96 %. Body mass index is 39.16 kg/m.  Treatment Plan Summary: Plan   -Resume home medication sof Vraylar for medication adherence, recommend discharging with rx in hand.  -TOC consult for referrals to inpatient rehab for alcohol use disorder. Patient prefers door to door transfer.  -Does not meet criteria for  IVC or inpatient psych at this time.   Labs reviewed include multiple abnormalities to inlcude hypokalemia (3.0), hypocalcemia (6.9), Alk Phos (148), BAL (104). U?A reveals hematuria (small). UDS negative. EKG 06/04 QTc @ 452  Psychiatric consult to sign off. Primary team notified of the above recommendations.   Disposition: No evidence of imminent risk to self or others at present.   Patient does not meet criteria for psychiatric inpatient admission. Supportive therapy provided about ongoing stressors. Refer to IOP. Discussed crisis plan, support from social network, calling 911, coming to the Emergency Department, and calling Suicide Hotline.  Maryagnes Amos, FNP 02/04/2023 9:43 AM

## 2023-02-03 NOTE — ED Notes (Signed)
Gave pt breakfast tray

## 2023-02-03 NOTE — Progress Notes (Signed)
TOC consulted for Substance Abuse resources. Resources provided and attached to AVS. No further TOC needs at this time.  

## 2023-02-03 NOTE — H&P (Signed)
History and Physical    Patient: [name]:[id number] DOB: [birth year] DOA: 02/03/2023 DOS: the patient was seen and examined on 02/03/2023 PCP: Pcp, No  Patient coming from: Home  Chief Complaint:  Chief Complaint  Patient presents with   Alcohol Problem   HPI: [name] is a 52 y.o. male with medical history significant of alcohol abuse, alcohol withdrawal syndrome seizures, cocaine abuse, anxiety, history of panic attacks with palpitations, PTSD, depression, bipolar 1 disorder, asthma who is coming to emergency department with complaints of alcohol withdrawal symptoms after drinking forty eight 12 ounces beer cans from Thursday last week to yesterday evening.  No EtOH use today.  He is currently experiencing anxiety, tremors and tachycardia after he woke up around 0600 this a.m.  No suicidal or homicidal thoughts.  He is having some insomnia.  He feels that his PTSD triggers are worse recently. He denied fever, chills, rhinorrhea, sore throat, wheezing or hemoptysis.  No chest pain, palpitations, diaphoresis, PND, orthopnea or pitting edema of the lower extremities.  No abdominal pain, nausea, emesis, diarrhea, constipation, melena or hematochezia.  No flank pain, dysuria, frequency or hematuria.  No polyuria, polydipsia, polyphagia or blurred vision.   Lab work: CBC was normal.  Alcohol level was 104 mg/dL. CMP showed a potassium of 3.0 mmol/L, glucose 112, BUN 5, creatinine 0.65 and calcium 6.9 mg/dL.  The rest of the electrolytes and hepatic functions were normal.  ED course: Initial vital signs were temperature 98 F, pulse 104, respiratory rate 19, BP 156/97 mmHg O2 sat 96% on room air.  The patient received 1000 mL of LR bolus, lorazepam 1 mg IVP, lorazepam 2 mg p.o. x 1, magnesium oxide 100 mg p.o. x 1, and KCl 40 mill equivalents p.o. x 1.   Review of Systems: As mentioned in the history of present illness. All other systems reviewed and are negative.  Past Medical History:   Diagnosis Date   Alcohol dependence with withdrawal (HCC)    Anxiety    Arrhythmia    Asthma    Atrial fibrillation (HCC)    not on AC   Bipolar 1 disorder (HCC)    Class 2 obesity    Cocaine abuse (HCC)    Depression    Heart palpitations    with severe anxiety   Hyperlipidemia    Hypertension    PTSD (post-traumatic stress disorder)    PTSD (post-traumatic stress disorder)    Syncope and collapse    Past Surgical History:  Procedure Laterality Date   NO PAST SURGERIES     Social History:  reports that he has never smoked. He has been exposed to tobacco smoke. He has never used smokeless tobacco. He reports that he does not currently use alcohol. He reports that he does not currently use drugs after having used the following drugs: Cocaine.  Allergies  Allergen Reactions   Metoprolol Other (See Comments)    Prostate problem    Family History  Problem Relation Age of Onset   Alcohol abuse Mother    Heart disease Mother    Hypertension Father    Alcohol abuse Maternal Grandfather     Prior to Admission medications   Medication Sig Start Date End Date Taking? Authorizing Provider  amLODipine (NORVASC) 5 MG tablet Take 1 tablet (5 mg total) by mouth daily. 01/05/23 02/04/23  Zigmund Daniel., MD    Physical Exam: Vitals:   02/03/23 8119 02/03/23 1478 02/03/23 0820  BP:   Marland Kitchen)  156/97  Pulse:  (!) 104 (!) 104  Resp:  19   Temp:  98 F (36.7 C)   TempSrc:  Oral   SpO2:  96%   Weight: 113.4 kg    Height: 5\' 7"  (1.702 m)     Physical Exam Vitals and nursing note reviewed.  Constitutional:      Appearance: Normal appearance.  HENT:     Head: Normocephalic.     Nose: No rhinorrhea.     Mouth/Throat:     Mouth: Mucous membranes are moist.  Eyes:     General: No scleral icterus.    Pupils: Pupils are equal, round, and reactive to light.  Cardiovascular:     Rate and Rhythm: Regular rhythm. Tachycardia present.  Pulmonary:     Effort: Pulmonary effort is  normal.     Breath sounds: Normal breath sounds.  Abdominal:     General: Bowel sounds are normal. There is no distension.     Palpations: Abdomen is soft.     Tenderness: There is no abdominal tenderness.  Musculoskeletal:     Cervical back: Neck supple.     Right lower leg: Edema present.     Left lower leg: Edema present.  Skin:    General: Skin is warm and dry.  Neurological:     General: No focal deficit present.     Mental Status: He is alert.  Psychiatric:        Attention and Perception: Attention normal.        Mood and Affect: Mood is anxious.        Behavior: Behavior normal.        Thought Content: Thought content is not paranoid. Thought content does not include homicidal or suicidal ideation.        Cognition and Memory: Cognition normal.   Data Reviewed:  Results are pending, will review when available.  Vent. rate 103 BPM PR interval 188 ms QRS duration 103 ms QT/QTcB 345/452 ms P-R-T axes 34 -33 20 Sinus tachycardia Left axis deviation Anterior infarct, old  Assessment and Plan: Principal Problem:   Alcohol use disorder, severe, dependence (HCC) Presenting with:   Alcohol withdrawal (HCC) Stepdown/inpatient. CIWA protocol with lorazepam. Magnesium sulfate supplementation. Folate, MVI and thiamine. Consult TOC team. Alcohol cessation advised.  Active Problems:   Cocaine abuse (HCC) In remission per patient.    Hypomagnesemia Replace with mag oxide 800 mg p.o. Follow-up level as needed.    Hypokalemia Replacing. Magnesium was supplemented. Follow-up potassium level in AM.    Hypocalcemia Frequently low on previous measurements. Check phosphorus and vitamin D level. Further workup depending on results.    Hypertension Resume amlodipine 10 mg p.o. daily.    Post traumatic stress disorder (PTSD)   Bipolar 1 disorder (HCC) With anxiety and depression. Currently off medications. Was following with psychology. Psychology recommended  psych evaluation. Consult psychiatry while in the hospital. Recommended to go to counseling as outpatient..    Class 2 obesity due to excess calories with  body mass index (BMI) of 39.0 to 39.9 in adult BMI 29.16 kg/m. Lifestyle modifications. Follow-up with primary care provider.    Advance Care Planning:   Code Status: Full Code   Consults: Behavioral health.  Family Communication:   Severity of Illness: The appropriate patient status for this patient is OBSERVATION. Observation status is judged to be reasonable and necessary in order to provide the required intensity of service to ensure the patient's safety. The patient's presenting symptoms, physical  exam findings, and initial radiographic and laboratory data in the context of their medical condition is felt to place them at decreased risk for further clinical deterioration. Furthermore, it is anticipated that the patient will be medically stable for discharge from the hospital within 2 midnights of admission.   Author: Bobette Mo, MD 02/03/2023 10:04 AM  For on call review [email or web address].   This document was prepared using Dragon voice recognition software and may contain some unintended transcription errors.

## 2023-02-03 NOTE — ED Notes (Signed)
ED TO INPATIENT HANDOFF REPORT  ED Nurse Name and Phone #:   S Name/Age/Gender [name] 52 y.o. male Room/Bed: WA09/WA09  Code Status   Code Status: Full Code  Home/SNF/Other Home Patient oriented to: self, place, time, and situation Is this baseline? Yes   Triage Complete: Triage complete  Chief Complaint Alcohol withdrawal (HCC) [F10.939]  Triage Note Pt BIBA for ETPH withdrawal, usu drinks 12 pack a day, reports 3 15-pack ++ over the last few days. Last drink 9p, woke up 6a with tremors and tachycardia.  144/92 Hr 110-120 95% RA CBG 111   Allergies Allergies  Allergen Reactions   Metoprolol Other (See Comments)    Prostate problem    Level of Care/Admitting Diagnosis ED Disposition     ED Disposition  Admit   Condition  --   Comment  Hospital Area: [name] [[id number]]  Level of Care: Stepdown [14]  Admit to SDU based on following criteria: Severe physiological/psychological symptoms:  Any diagnosis requiring assessment & intervention at least every 4 hours on an ongoing basis to obtain desired patient outcomes including stability and rehabilitation  May place patient in observation at [facility] or [facility] if equivalent level of care is available:: No  Covid Evaluation: Asymptomatic - no recent exposure (last 10 days) testing not required  Diagnosis: Alcohol withdrawal (HCC) [291.81.ICD-9-CM]  Admitting Physician: Bobette Mo [[id number]]  Attending Physician: Bobette Mo [[id number]]          B Medical/Surgery History Past Medical History:  Diagnosis Date   Alcohol dependence with withdrawal (HCC)    Anxiety    Arrhythmia    Asthma    Atrial fibrillation (HCC)    not on AC   Bipolar 1 disorder (HCC)    Class 2 obesity    Cocaine abuse (HCC)    Depression    Heart palpitations    with severe anxiety   Hyperlipidemia    Hypertension    PTSD (post-traumatic stress disorder)    PTSD (post-traumatic  stress disorder)    Syncope and collapse    Past Surgical History:  Procedure Laterality Date   NO PAST SURGERIES       A IV Location/Drains/Wounds Patient Lines/Drains/Airways Status     Active Line/Drains/Airways     Name Placement date Placement time Site Days   Peripheral IV 02/03/23 20 G 1" Left;Lateral Antecubital 02/03/23  0825  Antecubital  less than 1            Intake/Output Last 24 hours  Intake/Output Summary (Last 24 hours) at 02/03/2023 1132 Last data filed at 02/03/2023 0932 Gross per 24 hour  Intake 1000 ml  Output --  Net 1000 ml    Labs/Imaging Results for orders placed or performed during the hospital encounter of 02/03/23 (from the past 48 hour(s))  Comprehensive metabolic panel     Status: Abnormal   Collection Time: 02/03/23  8:29 AM  Result Value Ref Range   Sodium 140 135 - 145 mmol/L   Potassium 3.0 (L) 3.5 - 5.1 mmol/L   Chloride 105 98 - 111 mmol/L   CO2 22 22 - 32 mmol/L   Glucose, Bld 112 (H) 70 - 99 mg/dL    Comment: Glucose reference range applies only to samples taken after fasting for at least 8 hours.   BUN 5 (L) 6 - 20 mg/dL   Creatinine, Ser 1.91 0.61 - 1.24 mg/dL   Calcium 6.9 (L) 8.9 - 10.3 mg/dL  Total Protein 7.2 6.5 - 8.1 g/dL   Albumin 3.7 3.5 - 5.0 g/dL   AST 23 15 - 41 U/L   ALT 16 0 - 44 U/L   Alkaline Phosphatase 148 (H) 38 - 126 U/L   Total Bilirubin 0.7 0.3 - 1.2 mg/dL   GFR, Estimated >08 >65 mL/min    Comment: (NOTE) Calculated using the CKD-EPI Creatinine Equation (2021)    Anion gap 13 5 - 15    Comment: Performed at [facility], [address], [place], [place] [address]  CBC with Differential     Status: None   Collection Time: 02/03/23  8:29 AM  Result Value Ref Range   WBC 5.4 4.0 - 10.5 K/uL   RBC 5.16 4.22 - 5.81 MIL/uL   Hemoglobin 13.8 13.0 - 17.0 g/dL   HCT 62.9 52.8 - 41.3 %   MCV 83.1 80.0 - 100.0 fL   MCH 26.7 26.0 - 34.0 pg   MCHC 32.2 30.0 - 36.0 g/dL   RDW 24.4 01.0  - 27.2 %   Platelets 283 150 - 400 K/uL   nRBC 0.0 0.0 - 0.2 %   Neutrophils Relative % 60 %   Neutro Abs 3.3 1.7 - 7.7 K/uL   Lymphocytes Relative 29 %   Lymphs Abs 1.6 0.7 - 4.0 K/uL   Monocytes Relative 8 %   Monocytes Absolute 0.4 0.1 - 1.0 K/uL   Eosinophils Relative 2 %   Eosinophils Absolute 0.1 0.0 - 0.5 K/uL   Basophils Relative 1 %   Basophils Absolute 0.0 0.0 - 0.1 K/uL   Immature Granulocytes 0 %   Abs Immature Granulocytes 0.01 0.00 - 0.07 K/uL    Comment: Performed at [facility], [address], [place], [place] [address]  Ethanol     Status: Abnormal   Collection Time: 02/03/23  8:29 AM  Result Value Ref Range   Alcohol, Ethyl (B) 104 (H) <10 mg/dL    Comment: (NOTE) Lowest detectable limit for serum alcohol is 10 mg/dL.  For medical purposes only. Performed at [facility], [address] [place], [place] [address]   Rapid urine drug screen (hospital performed)     Status: None   Collection Time: 02/03/23 10:46 AM  Result Value Ref Range   Opiates NONE DETECTED NONE DETECTED   Cocaine NONE DETECTED NONE DETECTED   Benzodiazepines NONE DETECTED NONE DETECTED   Amphetamines NONE DETECTED NONE DETECTED   Tetrahydrocannabinol NONE DETECTED NONE DETECTED   Barbiturates NONE DETECTED NONE DETECTED    Comment: (NOTE) DRUG SCREEN FOR MEDICAL PURPOSES ONLY.  IF CONFIRMATION IS NEEDED FOR ANY PURPOSE, NOTIFY LAB WITHIN 5 DAYS.  LOWEST DETECTABLE LIMITS FOR URINE DRUG SCREEN Drug Class                     Cutoff (ng/mL) Amphetamine and metabolites    1000 Barbiturate and metabolites    200 Benzodiazepine                 200 Opiates and metabolites        300 Cocaine and metabolites        300 THC                            50 Performed at [facility], [address], [place], [place] [address]    No results found.  Pending Labs Unresulted  Labs (From admission, onward)     Start     Ordered    02/04/23 0500  Comprehensive metabolic panel  Tomorrow morning,   R        02/03/23 1005   02/04/23 0500  CBC  Tomorrow morning,   R        02/03/23 1005   02/03/23 1007  Phosphorus  Add-on,   AD        02/03/23 1007   02/03/23 1007  VITAMIN D 25 Hydroxy (Vit-D Deficiency, Fractures)  Add-on,   AD        02/03/23 1007   02/03/23 1000  Urinalysis, Routine w reflex microscopic -Urine, Clean Catch  Once,   R       Question:  Specimen Source  Answer:  Urine, Clean Catch   02/03/23 1005            Vitals/Pain Today's Vitals   02/03/23 0820 02/03/23 1030 02/03/23 1120 02/03/23 1131  BP: (!) 156/97 (!) 164/100    Pulse: (!) 104 87 90   Resp:  16 19   Temp:    98.8 F (37.1 C)  TempSrc:    Oral  SpO2:  100% 98%   Weight:      Height:      PainSc:        Isolation Precautions No active isolations  Medications Medications  thiamine (VITAMIN B1) tablet 100 mg (100 mg Oral Given 02/03/23 0836)    Or  thiamine (VITAMIN B1) injection 100 mg ( Intravenous See Alternative 02/03/23 0836)  folic acid (FOLVITE) tablet 1 mg (1 mg Oral Given 02/03/23 0836)  multivitamin with minerals tablet 1 tablet (1 tablet Oral Given 02/03/23 0836)  LORazepam (ATIVAN) tablet 1-4 mg (has no administration in time range)    Or  LORazepam (ATIVAN) injection 1-4 mg (has no administration in time range)  LORazepam (ATIVAN) tablet 0-4 mg (2 mg Oral Given 02/03/23 1044)    Followed by  LORazepam (ATIVAN) tablet 0-4 mg (has no administration in time range)  enoxaparin (LOVENOX) injection 40 mg (has no administration in time range)  potassium chloride SA (KLOR-CON M) CR tablet 40 mEq (has no administration in time range)  acetaminophen (TYLENOL) tablet 650 mg (has no administration in time range)    Or  acetaminophen (TYLENOL) suppository 650 mg (has no administration in time range)  ondansetron (ZOFRAN) tablet 4 mg (has no administration in time range)    Or  ondansetron (ZOFRAN) injection 4 mg (has no  administration in time range)  0.9 % NaCl with KCl 20 mEq/ L  infusion ( Intravenous New Bag/Given 02/03/23 1045)  amLODipine (NORVASC) tablet 5 mg (5 mg Oral Given 02/03/23 1057)  lactated ringers bolus 1,000 mL (0 mLs Intravenous Stopped 02/03/23 0932)  LORazepam (ATIVAN) injection 1 mg (1 mg Intravenous Given 02/03/23 0938)  potassium chloride SA (KLOR-CON M) CR tablet 40 mEq (40 mEq Oral Given 02/03/23 0940)  magnesium oxide (MAG-OX) tablet 800 mg (800 mg Oral Given 02/03/23 0940)    Mobility walks     Focused Assessments Cardiac Assessment Handoff:    Lab Results  Component Value Date   TROPONINI <0.30 04/25/2013   Lab Results  Component Value Date   DDIMER 0.38 07/14/2022   Does the Patient currently have chest pain? No      R Recommendations: See Admitting Provider Note  Report given to:   Additional Notes:

## 2023-02-03 NOTE — ED Triage Notes (Signed)
Pt BIBA for ETPH withdrawal, usu drinks 12 pack a day, reports 3 15-pack ++ over the last few days. Last drink 9p, woke up 6a with tremors and tachycardia.  144/92 Hr 110-120 95% RA CBG 111

## 2023-02-04 ENCOUNTER — Other Ambulatory Visit (HOSPITAL_COMMUNITY): Payer: Self-pay

## 2023-02-04 DIAGNOSIS — F10930 Alcohol use, unspecified with withdrawal, uncomplicated: Secondary | ICD-10-CM | POA: Diagnosis not present

## 2023-02-04 DIAGNOSIS — F1020 Alcohol dependence, uncomplicated: Secondary | ICD-10-CM | POA: Diagnosis not present

## 2023-02-04 LAB — COMPREHENSIVE METABOLIC PANEL
ALT: 14 U/L (ref 0–44)
AST: 19 U/L (ref 15–41)
Albumin: 3.2 g/dL — ABNORMAL LOW (ref 3.5–5.0)
Alkaline Phosphatase: 142 U/L — ABNORMAL HIGH (ref 38–126)
Anion gap: 8 (ref 5–15)
BUN: 10 mg/dL (ref 6–20)
CO2: 25 mmol/L (ref 22–32)
Calcium: 8.2 mg/dL — ABNORMAL LOW (ref 8.9–10.3)
Chloride: 105 mmol/L (ref 98–111)
Creatinine, Ser: 0.6 mg/dL — ABNORMAL LOW (ref 0.61–1.24)
GFR, Estimated: >60 mL/min (ref >60)
Glucose, Bld: 119 mg/dL — ABNORMAL HIGH (ref 70–99)
Potassium: 3.3 mmol/L — ABNORMAL LOW (ref 3.5–5.1)
Sodium: 138 mmol/L (ref 135–145)
Total Bilirubin: 0.7 mg/dL (ref 0.3–1.2)
Total Protein: 6.2 g/dL — ABNORMAL LOW (ref 6.5–8.1)

## 2023-02-04 LAB — CBC
HCT: 38.4 % — ABNORMAL LOW (ref 39.0–52.0)
Hemoglobin: 12.4 g/dL — ABNORMAL LOW (ref 13.0–17.0)
MCH: 27.3 pg (ref 26.0–34.0)
MCHC: 32.3 g/dL (ref 30.0–36.0)
MCV: 84.6 fL (ref 80.0–100.0)
Platelets: 226 10*3/uL (ref 150–400)
RBC: 4.54 MIL/uL (ref 4.22–5.81)
RDW: 13.9 % (ref 11.5–15.5)
WBC: 5.8 10*3/uL (ref 4.0–10.5)
nRBC: 0 % (ref 0.0–0.2)

## 2023-02-04 MED ORDER — TRAMADOL HCL 50 MG PO TABS
25.0000 mg | ORAL_TABLET | Freq: Once | ORAL | Status: AC | PRN
  Administered 2023-02-04: 25 mg via ORAL
  Filled 2023-02-04: qty 1

## 2023-02-04 MED ORDER — CARIPRAZINE HCL 1.5 MG PO CAPS
1.5000 mg | ORAL_CAPSULE | Freq: Every day | ORAL | 2 refills | Status: DC
  Filled 2023-02-04 – 2023-02-12 (×2): qty 30, 30d supply, fill #0

## 2023-02-04 MED ORDER — POTASSIUM CHLORIDE CRYS ER 20 MEQ PO TBCR
20.0000 meq | EXTENDED_RELEASE_TABLET | Freq: Once | ORAL | Status: AC
  Administered 2023-02-04: 20 meq via ORAL
  Filled 2023-02-04: qty 1

## 2023-02-04 MED ORDER — CARIPRAZINE HCL 1.5 MG PO CAPS
1.5000 mg | ORAL_CAPSULE | Freq: Every day | ORAL | 2 refills | Status: DC
  Filled 2023-02-04: qty 30, 30d supply, fill #0

## 2023-02-04 MED ORDER — CARIPRAZINE HCL 1.5 MG PO CAPS
1.5000 mg | ORAL_CAPSULE | Freq: Every day | ORAL | 0 refills | Status: DC
  Filled 2023-02-04: qty 10, 10d supply, fill #0

## 2023-02-04 NOTE — Plan of Care (Signed)

## 2023-02-04 NOTE — Discharge Summary (Signed)
Physician Discharge Summary   COSTAS VANNICE ZOX:[id number] DOB: [birth year] DOA: 02/03/2023  PCP: Oneita Hurt, No  Admit date: 02/03/2023 Discharge date: 02/04/2023   Admitted From: Home Disposition:  Home Discharging physician: Lewie Chamber, MD Barriers to discharge: none  Recommendations at discharge: Encourage med compliance with Vraylar Encourage etoh weaning   Discharge Condition: stable CODE STATUS: Full Diet recommendation:  Diet Orders (From admission, onward)     Start     Ordered   02/04/23 0000  Diet - low sodium heart healthy        02/04/23 1250   02/03/23 0959  Diet Heart Room service appropriate? Yes; Fluid consistency: Thin  Diet effective now       Question Answer Comment  Room service appropriate? Yes   Fluid consistency: Thin      02/03/23 1005            Hospital Course:  Alcohol abuse - only mild w/d symptoms and improved overnight; patient continuously asking to go home - cessation/weaning strongly encouraged but patient not expressing wishes to quit etoh use - stable for d/c; not felt to warrant librium given high inclination to return to drinking at discharge    PTSD Bipolar disorder -Noncompliance with following with psychiatry outpatient and noncompliance with Vraylar -Patient denied any suicidal or homicidal ideation during hospitalization.  Denied any self-harm intent with his excessive alcohol use - Evaluated by psychiatry during hospitalization as well and cleared for discharge home - he was recommended to be restarted back on Vraylar which he still endorsed hesitation prior to discharge.  Medication was sent to outpatient pharmacy and he was recommended to continue obtaining refills and following up with outpatient provider  Hx Cocaine abuse (HCC) In remission per patient. - negative UDS on admission   Hypomagnesemia Hypokalemia Hypocalcemia - repleted   Hypertension -Continue home amlodipine  Obesity - Complicates overall prognosis  and care - Body mass index is 39.16 kg/m.   The patient's chronic medical conditions were treated accordingly per the patient's home medication regimen except as noted.  On day of discharge, patient was felt deemed stable for discharge. Patient/family member advised to call PCP or come back to ER if needed.   Principal Diagnosis: Alcohol withdrawal (HCC)  Discharge Diagnoses: Active Hospital Problems   Diagnosis Date Noted   Alcohol withdrawal (HCC) 08/22/2020   Hypomagnesemia 02/03/2023   Hypokalemia 02/03/2023   Hypocalcemia 02/03/2023   OSA (obstructive sleep apnea) 02/03/2023   Bipolar 1 disorder (HCC) 06/20/2020   Class 2 obesity due to excess calories with body mass index (BMI) of 39.0 to 39.9 in adult 06/14/2020   Cocaine abuse (HCC) 06/14/2020   Alcohol use disorder, severe, dependence (HCC) 04/12/2015   Hypertension 09/08/2011   Post traumatic stress disorder (PTSD) 09/08/2011    Resolved Hospital Problems  No resolved problems to display.     Discharge Instructions     Diet - low sodium heart healthy   Complete by: As directed    Increase activity slowly   Complete by: As directed       Allergies as of 02/04/2023       Reactions   Metoprolol Other (See Comments)   Prostate problem        Medication List     STOP taking these medications    calcitRIOL 0.5 MCG capsule Commonly known as: ROCALTROL   chlordiazePOXIDE 25 MG capsule Commonly known as: LIBRIUM   cloNIDine 0.2 MG tablet Commonly known as: CATAPRES  TAKE these medications    amLODipine 5 MG tablet Commonly known as: NORVASC Take 1 tablet (5 mg total) by mouth daily.   Vraylar 1.5 MG capsule Generic drug: cariprazine Take 1 capsule (1.5 mg total) by mouth daily. Start taking on: February 05, 2023 What changed:  how much to take when to take this   cariprazine 1.5 MG capsule Commonly known as: Vraylar Take 1 capsule (1.5 mg total) by mouth daily. Start taking on: February 14, 2023 What changed: You were already taking a medication with the same name, and this prescription was added. Make sure you understand how and when to take each.        Allergies  Allergen Reactions   Metoprolol Other (See Comments)    Prostate problem    Consultations: Psych  Procedures:   Discharge Exam: BP (!) 137/52   Pulse 80   Temp 98.3 F (36.8 C)   Resp 20   Ht 5\' 7"  (1.702 m)   Wt 113.4 kg   SpO2 96%   BMI 39.16 kg/m  Physical Exam Constitutional:      General: He is not in acute distress.    Appearance: Normal appearance.  HENT:     Head: Normocephalic and atraumatic.     Mouth/Throat:     Mouth: Mucous membranes are moist.  Eyes:     Extraocular Movements: Extraocular movements intact.  Cardiovascular:     Rate and Rhythm: Normal rate and regular rhythm.  Pulmonary:     Effort: Pulmonary effort is normal. No respiratory distress.     Breath sounds: Normal breath sounds. No wheezing.  Abdominal:     General: Bowel sounds are normal. There is no distension.     Palpations: Abdomen is soft.     Tenderness: There is no abdominal tenderness.  Musculoskeletal:        General: Normal range of motion.     Cervical back: Normal range of motion and neck supple.  Skin:    General: Skin is warm and dry.  Neurological:     General: No focal deficit present.     Mental Status: He is alert.     Comments: Mild tremor in hands  Psychiatric:        Mood and Affect: Mood normal.        Behavior: Behavior normal.      The results of significant diagnostics from this hospitalization (including imaging, microbiology, ancillary and laboratory) are listed below for reference.   Microbiology: Recent Results (from the past 240 hour(s))  MRSA Next Gen by PCR, Nasal     Status: None   Collection Time: 02/03/23  3:08 PM   Specimen: Nasal Mucosa; Nasal Swab  Result Value Ref Range Status   MRSA by PCR Next Gen NOT DETECTED NOT DETECTED Final    Comment: (NOTE) The  GeneXpert MRSA Assay (FDA approved for NASAL specimens only), is one component of a comprehensive MRSA colonization surveillance program. It is not intended to diagnose MRSA infection nor to guide or monitor treatment for MRSA infections. Test performance is not FDA approved in patients less than 62 years old. Performed at [facility], [address] [place], [place] [address]      Labs: BNP (last 3 results) Recent Labs    07/13/22 1604  BNP 10.5   Basic Metabolic Panel: Recent Labs  Lab 02/03/23 0829 02/03/23 1046 02/04/23 0318  NA 140  --  138  K 3.0*  --  3.3*  CL 105  --  105  CO2 22  --  25  GLUCOSE 112*  --  119*  BUN 5*  --  10  CREATININE 0.69  --  0.60*  CALCIUM 6.9*  --  8.2*  PHOS  --  2.9  --    Liver Function Tests: Recent Labs  Lab 02/03/23 0829 02/04/23 0318  AST 23 19  ALT 16 14  ALKPHOS 148* 142*  BILITOT 0.7 0.7  PROT 7.2 6.2*  ALBUMIN 3.7 3.2*   No results for input(s): "LIPASE", "AMYLASE" in the last 168 hours. No results for input(s): "AMMONIA" in the last 168 hours. CBC: Recent Labs  Lab 02/03/23 0829 02/04/23 0318  WBC 5.4 5.8  NEUTROABS 3.3  --   HGB 13.8 12.4*  HCT 42.9 38.4*  MCV 83.1 84.6  PLT 283 226   Cardiac Enzymes: No results for input(s): "CKTOTAL", "CKMB", "CKMBINDEX", "TROPONINI" in the last 168 hours. BNP: Invalid input(s): "POCBNP" CBG: No results for input(s): "GLUCAP" in the last 168 hours. D-Dimer No results for input(s): "DDIMER" in the last 72 hours. Hgb A1c No results for input(s): "HGBA1C" in the last 72 hours. Lipid Profile No results for input(s): "CHOL", "HDL", "LDLCALC", "TRIG", "CHOLHDL", "LDLDIRECT" in the last 72 hours. Thyroid function studies No results for input(s): "TSH", "T4TOTAL", "T3FREE", "THYROIDAB" in the last 72 hours.  Invalid input(s): "FREET3" Anemia work up No results for input(s): "VITAMINB12", "FOLATE", "FERRITIN", "TIBC", "IRON", "RETICCTPCT" in the  last 72 hours. Urinalysis    Component Value Date/Time   COLORURINE STRAW (A) 02/03/2023 1046   APPEARANCEUR CLEAR 02/03/2023 1046   LABSPEC 1.008 02/03/2023 1046   PHURINE 8.0 02/03/2023 1046   GLUCOSEU NEGATIVE 02/03/2023 1046   HGBUR SMALL (A) 02/03/2023 1046   BILIRUBINUR NEGATIVE 02/03/2023 1046   KETONESUR 5 (A) 02/03/2023 1046   PROTEINUR NEGATIVE 02/03/2023 1046   UROBILINOGEN 0.2 04/09/2015 0206   NITRITE NEGATIVE 02/03/2023 1046   LEUKOCYTESUR NEGATIVE 02/03/2023 1046   Sepsis Labs Recent Labs  Lab 02/03/23 0829 02/04/23 0318  WBC 5.4 5.8   Microbiology Recent Results (from the past 240 hour(s))  MRSA Next Gen by PCR, Nasal     Status: None   Collection Time: 02/03/23  3:08 PM   Specimen: Nasal Mucosa; Nasal Swab  Result Value Ref Range Status   MRSA by PCR Next Gen NOT DETECTED NOT DETECTED Final    Comment: (NOTE) The GeneXpert MRSA Assay (FDA approved for NASAL specimens only), is one component of a comprehensive MRSA colonization surveillance program. It is not intended to diagnose MRSA infection nor to guide or monitor treatment for MRSA infections. Test performance is not FDA approved in patients less than 67 years old. Performed at [facility], [address], [place], [place] [address]     Procedures/Studies: No results found.   Time coordinating discharge: Over 30 minutes    Lewie Chamber, MD  [facility] 02/04/2023, 5:06 PM

## 2023-02-04 NOTE — TOC Transition Note (Signed)
Transition of Care [facility]) - CM/SW Discharge Note   Patient Details  Name: [name] MRN: [id number] Date of Birth: 16-Jan-1971  Transition of Care [facility]) CM/SW Contact:  Otelia Santee, LCSW Phone Number: 02/04/2023, 10:07 AM   Clinical Narrative:    Met with pt at bedside to discuss SDOH and substance use concerns. Pt shares he typically travels via bus as he does not have access to a car. Pt is agreeable to having resources placed for transportation and food resources. SA resources added to pt's f/u while in ED.  TOC signing off.    Final next level of care: Home/Self Care Barriers to Discharge: No Barriers Identified   Patient Goals and CMS Choice CMS Medicare.gov Compare Post Acute Care list provided to:: Patient Choice offered to / list presented to : Patient  Discharge Placement                         Discharge Plan and Services Additional resources added to the After Visit Summary for                  DME Arranged: N/A DME Agency: NA                  Social Determinants of Health (SDOH) Interventions SDOH Screenings   Food Insecurity: Food Insecurity Present (02/03/2023)  Housing: Medium Risk (02/03/2023)  Transportation Needs: Unmet Transportation Needs (02/03/2023)  Utilities: Not At Risk (02/03/2023)  Alcohol Screen: Medium Risk (06/20/2020)  Depression (PHQ2-9): Medium Risk (09/22/2021)  Tobacco Use: Medium Risk (02/03/2023)     Readmission Risk Interventions     No data to display

## 2023-02-04 NOTE — TOC Benefit Eligibility Note (Signed)
Patient Product/process development scientist completed.    The patient is currently admitted and upon discharge could be taking Vraylar 1.5 mg capsules.  The current 30 day co-pay is $0.00.   The patient is insured through [employer]   This test claim was processed through [facility]- copay amounts may vary at other pharmacies due to pharmacy/plan contracts, or as the patient moves through the different stages of their insurance plan.  Roland Earl, CPHT Pharmacy Patient Advocate Specialist [facility] Patient Advocate Team Direct Number: [phone]  Fax: [phone]

## 2023-02-05 ENCOUNTER — Other Ambulatory Visit (HOSPITAL_COMMUNITY): Payer: Self-pay

## 2023-02-12 ENCOUNTER — Other Ambulatory Visit: Payer: Self-pay

## 2023-02-12 ENCOUNTER — Other Ambulatory Visit (HOSPITAL_COMMUNITY): Payer: Self-pay

## 2023-02-15 NOTE — Progress Notes (Deleted)
Cardiology Office Note   Date:  02/15/2023   ID:  Aggie Hacker, DOB 1971/03/13, MRN [id number]  PCP:  Pcp, No    No chief complaint on file.    Wt Readings from Last 3 Encounters:  02/03/23 250 lb (113.4 kg)  01/05/23 251 lb 15.8 oz (114.3 kg)  11/12/22 258 lb (117 kg)       History of Present Illness: ERIBERTO [name] is a 52 y.o. male  ***    Past Medical History:  Diagnosis Date   Alcohol dependence with withdrawal (HCC)    Anxiety    Arrhythmia    Asthma    Atrial fibrillation (HCC)    not on AC   Bipolar 1 disorder (HCC)    Class 2 obesity    Cocaine abuse (HCC)    Depression    Heart palpitations    with severe anxiety   Hyperlipidemia    Hypertension    PTSD (post-traumatic stress disorder)    PTSD (post-traumatic stress disorder)    Syncope and collapse     Past Surgical History:  Procedure Laterality Date   NO PAST SURGERIES       Current Outpatient Medications  Medication Sig Dispense Refill   amLODipine (NORVASC) 5 MG tablet Take 1 tablet (5 mg total) by mouth daily. 30 tablet 1   cariprazine (VRAYLAR) 1.5 MG capsule Take 1 capsule (1.5 mg total) by mouth daily. 10 capsule 0   cariprazine (VRAYLAR) 1.5 MG capsule Take 1 capsule (1.5 mg total) by mouth daily. 30 capsule 2   No current facility-administered medications for this visit.    Allergies:   Metoprolol    Social History:  The patient  reports that he has never smoked. He has been exposed to tobacco smoke. He has never used smokeless tobacco. He reports that he does not currently use alcohol. He reports that he does not currently use drugs after having used the following drugs: Cocaine.   Family History:  The patient's ***family history includes Alcohol abuse in his maternal grandfather and mother; Heart disease in his mother; Hypertension in his father.    ROS:  Please see the history of present illness.   Otherwise, review of systems are positive for ***.   All other systems  are reviewed and negative.    PHYSICAL EXAM: VS:  There were no vitals taken for this visit. , BMI There is no height or weight on file to calculate BMI. GEN: Well nourished, well developed, in no acute distress HEENT: normal Neck: no JVD, carotid bruits, or masses Cardiac: ***RRR; no murmurs, rubs, or gallops,no edema  Respiratory:  clear to auscultation bilaterally, normal work of breathing GI: soft, nontender, nondistended, + BS MS: no deformity or atrophy Skin: warm and dry, no rash Neuro:  Strength and sensation are intact Psych: euthymic mood, full affect   EKG:   The ekg ordered today demonstrates ***   Recent Labs: 07/13/2022: B Natriuretic Peptide 10.5 01/04/2023: Magnesium 2.0 02/04/2023: ALT 14; BUN 10; Creatinine, Ser 0.60; Hemoglobin 12.4; Platelets 226; Potassium 3.3; Sodium 138   Lipid Panel    Component Value Date/Time   CHOL 130 06/19/2020 0424   TRIG 67 06/19/2020 0424   HDL 27 (L) 06/19/2020 0424   CHOLHDL 4.8 06/19/2020 0424   VLDL 13 06/19/2020 0424   LDLCALC 90 06/19/2020 0424     Other studies Reviewed: Additional studies/ records that were reviewed today with results demonstrating: ***.   ASSESSMENT AND  PLAN:  ***  *** ***   Current medicines are reviewed at length with the patient today.  The patient concerns regarding his medicines were addressed.  The following changes have been made:  No change***  Labs/ tests ordered today include: *** No orders of the defined types were placed in this encounter.   Recommend 150 minutes/week of aerobic exercise Low fat, low carb, high fiber diet recommended  Disposition:   FU in ***   Signed, Lance Muss, MD  02/15/2023 8:44 PM    [facility] [address] [place], [place], [place]  [address] Phone: [phone]; Fax: [phone]

## 2023-02-16 ENCOUNTER — Encounter: Payer: Self-pay | Admitting: Interventional Cardiology

## 2023-02-16 ENCOUNTER — Ambulatory Visit: Payer: 59 | Attending: Interventional Cardiology | Admitting: Interventional Cardiology

## 2023-03-04 ENCOUNTER — Encounter (HOSPITAL_COMMUNITY): Payer: Self-pay

## 2023-03-04 ENCOUNTER — Other Ambulatory Visit (HOSPITAL_COMMUNITY): Payer: Self-pay

## 2023-03-04 ENCOUNTER — Emergency Department (HOSPITAL_COMMUNITY): Payer: 59

## 2023-03-04 ENCOUNTER — Other Ambulatory Visit: Payer: Self-pay

## 2023-03-04 ENCOUNTER — Emergency Department (HOSPITAL_COMMUNITY)
Admission: EM | Admit: 2023-03-04 | Discharge: 2023-03-04 | Disposition: A | Discharge status: Home / Self Care | Payer: 59 | Attending: Emergency Medicine | Admitting: Emergency Medicine

## 2023-03-04 DIAGNOSIS — R000 Tachycardia, unspecified: Secondary | ICD-10-CM | POA: Insufficient documentation

## 2023-03-04 DIAGNOSIS — R0789 Other chest pain: Secondary | ICD-10-CM | POA: Insufficient documentation

## 2023-03-04 DIAGNOSIS — R0602 Shortness of breath: Secondary | ICD-10-CM | POA: Diagnosis not present

## 2023-03-04 DIAGNOSIS — F10130 Alcohol abuse with withdrawal, uncomplicated: Secondary | ICD-10-CM | POA: Insufficient documentation

## 2023-03-04 DIAGNOSIS — F4310 Post-traumatic stress disorder, unspecified: Secondary | ICD-10-CM | POA: Diagnosis not present

## 2023-03-04 DIAGNOSIS — R61 Generalized hyperhidrosis: Secondary | ICD-10-CM | POA: Diagnosis not present

## 2023-03-04 DIAGNOSIS — I252 Old myocardial infarction: Secondary | ICD-10-CM | POA: Diagnosis not present

## 2023-03-04 DIAGNOSIS — E876 Hypokalemia: Secondary | ICD-10-CM | POA: Insufficient documentation

## 2023-03-04 DIAGNOSIS — Y904 Blood alcohol level of 80-99 mg/100 ml: Secondary | ICD-10-CM | POA: Insufficient documentation

## 2023-03-04 DIAGNOSIS — F319 Bipolar disorder, unspecified: Secondary | ICD-10-CM | POA: Insufficient documentation

## 2023-03-04 DIAGNOSIS — R079 Chest pain, unspecified: Secondary | ICD-10-CM

## 2023-03-04 DIAGNOSIS — F1490 Cocaine use, unspecified, uncomplicated: Secondary | ICD-10-CM | POA: Insufficient documentation

## 2023-03-04 DIAGNOSIS — F10930 Alcohol use, unspecified with withdrawal, uncomplicated: Secondary | ICD-10-CM

## 2023-03-04 LAB — RAPID URINE DRUG SCREEN, HOSP PERFORMED
Amphetamines: NOT DETECTED
Barbiturates: NOT DETECTED
Benzodiazepines: NOT DETECTED
Cocaine: POSITIVE — AB
Opiates: NOT DETECTED
Tetrahydrocannabinol: NOT DETECTED

## 2023-03-04 LAB — COMPREHENSIVE METABOLIC PANEL
ALT: 13 U/L (ref 0–44)
AST: 19 U/L (ref 15–41)
Albumin: 3.4 g/dL — ABNORMAL LOW (ref 3.5–5.0)
Alkaline Phosphatase: 127 U/L — ABNORMAL HIGH (ref 38–126)
Anion gap: 12 (ref 5–15)
BUN: 6 mg/dL (ref 6–20)
CO2: 21 mmol/L — ABNORMAL LOW (ref 22–32)
Calcium: 8 mg/dL — ABNORMAL LOW (ref 8.9–10.3)
Chloride: 104 mmol/L (ref 98–111)
Creatinine, Ser: 0.84 mg/dL (ref 0.61–1.24)
GFR, Estimated: >60 mL/min (ref >60)
Glucose, Bld: 115 mg/dL — ABNORMAL HIGH (ref 70–99)
Potassium: 2.8 mmol/L — ABNORMAL LOW (ref 3.5–5.1)
Sodium: 137 mmol/L (ref 135–145)
Total Bilirubin: 0.7 mg/dL (ref 0.3–1.2)
Total Protein: 6.7 g/dL (ref 6.5–8.1)

## 2023-03-04 LAB — CBC
HCT: 41 % (ref 39.0–52.0)
Hemoglobin: 13.3 g/dL (ref 13.0–17.0)
MCH: 26.7 pg (ref 26.0–34.0)
MCHC: 32.4 g/dL (ref 30.0–36.0)
MCV: 82.2 fL (ref 80.0–100.0)
Platelets: 253 10*3/uL (ref 150–400)
RBC: 4.99 MIL/uL (ref 4.22–5.81)
RDW: 14.4 % (ref 11.5–15.5)
WBC: 5.8 10*3/uL (ref 4.0–10.5)
nRBC: 0 % (ref 0.0–0.2)

## 2023-03-04 LAB — TROPONIN I (HIGH SENSITIVITY)
Troponin I (High Sensitivity): 7 ng/L (ref <18)
Troponin I (High Sensitivity): 8 ng/L (ref <18)

## 2023-03-04 LAB — MAGNESIUM: Magnesium: 1.9 mg/dL (ref 1.7–2.4)

## 2023-03-04 LAB — ETHANOL: Alcohol, Ethyl (B): 92 mg/dL — ABNORMAL HIGH (ref <10)

## 2023-03-04 MED ORDER — DIAZEPAM 5 MG/ML IJ SOLN
5.0000 mg | INTRAMUSCULAR | Status: AC
  Administered 2023-03-04: 5 mg via INTRAVENOUS
  Filled 2023-03-04: qty 2

## 2023-03-04 MED ORDER — LACTATED RINGERS IV BOLUS
1000.0000 mL | Freq: Once | INTRAVENOUS | Status: AC
  Administered 2023-03-04: 1000 mL via INTRAVENOUS

## 2023-03-04 MED ORDER — POTASSIUM CHLORIDE 10 MEQ/100ML IV SOLN
10.0000 meq | Freq: Once | INTRAVENOUS | Status: AC
  Administered 2023-03-04: 10 meq via INTRAVENOUS
  Filled 2023-03-04: qty 100

## 2023-03-04 MED ORDER — THIAMINE MONONITRATE 100 MG PO TABS
100.0000 mg | ORAL_TABLET | Freq: Every day | ORAL | Status: DC
  Administered 2023-03-04: 100 mg via ORAL
  Filled 2023-03-04: qty 1

## 2023-03-04 MED ORDER — THIAMINE HCL 100 MG/ML IJ SOLN: 100.0000 mg | Freq: Every day | INTRAMUSCULAR | Status: DC

## 2023-03-04 MED ORDER — CHLORDIAZEPOXIDE HCL 25 MG PO CAPS
50.0000 mg | ORAL_CAPSULE | ORAL | Status: AC
  Administered 2023-03-04: 50 mg via ORAL
  Filled 2023-03-04: qty 2

## 2023-03-04 MED ORDER — ADULT MULTIVITAMIN W/MINERALS CH
1.0000 | ORAL_TABLET | Freq: Every day | ORAL | Status: DC
  Administered 2023-03-04: 1 via ORAL
  Filled 2023-03-04: qty 1

## 2023-03-04 MED ORDER — CHLORDIAZEPOXIDE HCL 25 MG PO CAPS: 50.0000 mg | ORAL_CAPSULE | ORAL | Status: DC

## 2023-03-04 MED ORDER — FOLIC ACID 1 MG PO TABS
1.0000 mg | ORAL_TABLET | Freq: Every day | ORAL | Status: DC
  Administered 2023-03-04: 1 mg via ORAL
  Filled 2023-03-04: qty 1

## 2023-03-04 MED ORDER — POTASSIUM CHLORIDE 20 MEQ PO PACK
80.0000 meq | PACK | ORAL | Status: AC
  Administered 2023-03-04: 80 meq via ORAL
  Filled 2023-03-04: qty 4

## 2023-03-04 MED ORDER — CHLORDIAZEPOXIDE HCL 25 MG PO CAPS
ORAL_CAPSULE | ORAL | 0 refills | Status: DC
  Filled 2023-03-04: qty 10, 3d supply, fill #0

## 2023-03-04 NOTE — ED Triage Notes (Signed)
From home via ems with c/o left side chest pain that radiates to left arm and neck. Pain onset 5 min after cocaine use. Given 324 ASA and nitroglycerin x 2 by ems. Patient states symptoms could be related to ETOH withdrawal. Reports drinking 7 24oz beers /day. Last ETOH approx 0300. Patient A&O x 4. VSS.

## 2023-03-04 NOTE — ED Provider Notes (Signed)
[place] EMERGENCY DEPARTMENT AT [facility] Provider Note   CSN: [id number] Arrival date & time: 03/04/23  0865     History  Chief Complaint  Patient presents with   Chest Pain    [name] is a 52 y.o. male.  52 year old male with a history of bipolar, depression, PTSD, alcohol abuse and withdrawal who presents emergency department with chest pain and concerns for alcohol withdrawal.  Patient states that he typically drinks seven 24 ounce beers per day.  Ran out of money and says that his last drink was 6 hours prior to arrival.  Says that he thinks he is starting to have withdrawals and has been having tremors and seeing black spots.  No other visual hallucinations.  Also is feeling very anxious anxious.  To treat the withdrawals he tried cocaine 50 minutes prior to arrival and shortly thereafter started having chest pain.  Left-sided the chest.  Pressure-like sensation.  Exertional.  Also has been having some sweating.  No vomiting.  Mild shortness of breath.  Does have multiple presentations with similar complaints.       Home Medications Prior to Admission medications   Medication Sig Start Date End Date Taking? Authorizing Provider  amLODipine (NORVASC) 5 MG tablet Take 1 tablet (5 mg total) by mouth daily. 01/05/23 03/04/23 Yes Zigmund Daniel., MD  chlordiazePOXIDE (LIBRIUM) 25 MG capsule Take 2 tablets (50mg ) by mouth three times daily for 1 day, then 1-2 tablets (25-50mg ) by mouth twice daily for 1 day, then 1-2 tablets (25-50mg ) by mouth daily for 1 day. 03/04/23  Yes Rondel Baton, MD  cariprazine (VRAYLAR) 1.5 MG capsule Take 1 capsule (1.5 mg total) by mouth daily. Patient not taking: Reported on 03/04/2023 02/05/23   Lewie Chamber, MD  cariprazine (VRAYLAR) 1.5 MG capsule Take 1 capsule (1.5 mg total) by mouth daily. 02/14/23   Lewie Chamber, MD      Allergies    Metoprolol    Review of Systems   Review of Systems  Physical Exam Updated Vital  Signs BP (!) 116/90   Pulse 91   Temp 98 F (36.7 C) (Oral)   Resp 16   Ht 5\' 6"  (1.676 m)   Wt 119.3 kg   SpO2 100%   BMI 42.45 kg/m  Physical Exam Vitals and nursing note reviewed.  Constitutional:      General: He is not in acute distress.    Appearance: He is well-developed.  HENT:     Head: Normocephalic and atraumatic.     Right Ear: External ear normal.     Left Ear: External ear normal.     Nose: Nose normal.     Mouth/Throat:     Comments: Mild tongue fasciculations Eyes:     Extraocular Movements: Extraocular movements intact.     Conjunctiva/sclera: Conjunctivae normal.     Pupils: Pupils are equal, round, and reactive to light.  Cardiovascular:     Rate and Rhythm: Regular rhythm. Tachycardia present.     Heart sounds: Normal heart sounds.     Comments: Radial pulses 2+ BL Pulmonary:     Effort: Pulmonary effort is normal. No respiratory distress.     Breath sounds: Normal breath sounds.  Musculoskeletal:     Cervical back: Normal range of motion and neck supple.     Right lower leg: No edema.     Left lower leg: No edema.  Skin:    General: Skin is warm and dry.  Neurological:     Mental Status: He is alert and oriented to person, place, and time. Mental status is at baseline.     Comments: Mild resting tremor  Psychiatric:        Behavior: Behavior normal.     Comments: Somewhat anxious appearing     ED Results / Procedures / Treatments   Labs (all labs ordered are listed, but only abnormal results are displayed) Labs Reviewed  COMPREHENSIVE METABOLIC PANEL - Abnormal; Notable for the following components:      Result Value   Potassium 2.8 (*)    CO2 21 (*)    Glucose, Bld 115 (*)    Calcium 8.0 (*)    Albumin 3.4 (*)    Alkaline Phosphatase 127 (*)    All other components within normal limits  ETHANOL - Abnormal; Notable for the following components:   Alcohol, Ethyl (B) 92 (*)    All other components within normal limits  RAPID URINE  DRUG SCREEN, HOSP PERFORMED - Abnormal; Notable for the following components:   Cocaine POSITIVE (*)    All other components within normal limits  CBC  MAGNESIUM  TROPONIN I (HIGH SENSITIVITY)  TROPONIN I (HIGH SENSITIVITY)    EKG EKG Interpretation Date/Time:  Wednesday March 04 2023 09:36:25 EDT Ventricular Rate:  105 PR Interval:  184 QRS Duration:  102 QT Interval:  358 QTC Calculation: 474 R Axis:   -27  Text Interpretation: Age not entered, assumed to be  52 years old for purpose of ECG interpretation Sinus tachycardia Borderline left axis deviation Anterior infarct, old Confirmed by Vonita Moss [phone]) on 03/04/2023 9:47:08 AM  Radiology DG Chest 2 View  Result Date: 03/04/2023 CLINICAL DATA:  CXR 11/03/22 EXAM: CHEST - 2 VIEW COMPARISON:  CXR 11/03/22 FINDINGS: No pleural effusion. No pneumothorax. No focal airspace opacity. Normal cardiac and mediastinal contours. No radiographically apparent displaced rib fractures. Visualized upper abdomen unremarkable. Vertebral body heights are maintained IMPRESSION: No focal airspace opacity Electronically Signed   By: Lorenza Cambridge M.D.   On: 03/04/2023 10:27    Procedures Procedures    Medications Ordered in ED Medications  thiamine (VITAMIN B1) tablet 100 mg (100 mg Oral Given 03/04/23 1043)    Or  thiamine (VITAMIN B1) injection 100 mg ( Intravenous See Alternative 03/04/23 1043)  folic acid (FOLVITE) tablet 1 mg (1 mg Oral Given 03/04/23 1043)  multivitamin with minerals tablet 1 tablet (1 tablet Oral Given 03/04/23 1043)  diazepam (VALIUM) injection 5 mg (5 mg Intravenous Given 03/04/23 0948)  lactated ringers bolus 1,000 mL (0 mLs Intravenous Stopped 03/04/23 1215)  potassium chloride (KLOR-CON) packet 80 mEq (80 mEq Oral Given 03/04/23 1132)  potassium chloride 10 mEq in 100 mL IVPB (0 mEq Intravenous Stopped 03/04/23 1209)  chlordiazePOXIDE (LIBRIUM) capsule 50 mg (50 mg Oral Given 03/04/23 1133)    ED Course/ Medical Decision Making/  A&P                              Medical Decision Making Amount and/or Complexity of Data Reviewed Labs: ordered. Radiology: ordered.  Risk OTC drugs. Prescription drug management.   [name] is a 52 y.o. male with comorbidities that complicate the patient evaluation including bipolar, depression, PTSD, alcohol abuse and withdrawal who presents emergency department with chest pain and concerns for alcohol withdrawal and chest pain  Initial Ddx:  Alcohol withdrawal, cocaine chest pain, vasospasm, MI, PE, PNA,  dissection   MDM/Course:  Patient arrived to the emergency department complaining of chest pain as well as alcohol withdrawals.  Does appear to have some mild alcohol withdrawals at this time and frequently presents to the emergency department with this.  No recent severe withdrawals requiring ICU and typically can go home on the Librium or discharged after a day or 2.  Was given some Valium and did not require additional benzos aside from Librium just prior to discharge.  Did not require any additional doses and did not appear to be in severe alcohol withdrawals.  Was given a prescription of Librium and it was sent to the [facility] pharmacy who will deliver the medication to the patient.   In terms of his chest pain, likely due to coronary vasospasm from his cocaine use which was present on his drug screen today.  His troponin and EKG were unremarkable.  Has already seen cardiology in regards to his chest pain which he frequently presents with.  Repeat troponin was also WNL without a significant change.  Patient counseled on cocaine cessation.  Potassium was also found to be quite low at 2.8 and magnesium was WNL.  Patient was repleted with 90 mill equivalents of potassium while in the emergency department.  He was instructed to follow-up with his primary doctor as well as behavioral health urgent care.   This patient presents to the ED for concern of complaints listed in HPI, this  involves an extensive number of treatment options, and is a complaint that carries with it a high risk of complications and morbidity. Disposition including potential need for admission considered.   Dispo: DC Home. Return precautions discussed including, but not limited to, those listed in the AVS. Allowed pt time to ask questions which were answered fully prior to dc.  Records reviewed ED Visit Notes and DC Summary The following labs were independently interpreted: Chemistry and show  hypokalemia I independently reviewed the following imaging with scope of interpretation limited to determining acute life threatening conditions related to emergency care: Chest x-ray and agree with the radiologist interpretation with the following exceptions: none I personally reviewed and interpreted cardiac monitoring: normal sinus rhythm  I personally reviewed and interpreted the pt's EKG: see above for interpretation  I have reviewed the patients home medications and made adjustments as needed Social Determinants of health:  Alcohol abuse         Final Clinical Impression(s) / ED Diagnoses Final diagnoses:  Chest pain, unspecified type  Alcohol withdrawal syndrome without complication (HCC)    Rx / DC Orders ED Discharge Orders          Ordered    chlordiazePOXIDE (LIBRIUM) 25 MG capsule        03/04/23 1322              Rondel Baton, MD 03/04/23 1814

## 2023-03-04 NOTE — Discharge Planning (Signed)
RNCM consulted in regards to medication assistance.  Pt has Medicare insurance coverage and is not eligible for Medication Assistance Through [facility]) program.  No further CM needs communicated at this time.

## 2023-03-04 NOTE — Discharge Instructions (Signed)
You were seen for your alcohol withdrawal and chest pain in the emergency department.   At home, please take the Librium that we have prescribed you.  Do not drink alcohol while taking the Librium.    Check your MyChart online for the results of any tests that had not resulted by the time you left the emergency department.   Follow-up with your primary doctor in 2-3 days regarding your visit.    Return immediately to the emergency department if you experience any of the following: Worsening withdrawal symptoms, chest pain, unexplained sweating or vomiting, or any other concerning symptoms.    Thank you for visiting our Emergency Department. It was a pleasure taking care of you today.

## 2023-03-08 ENCOUNTER — Emergency Department (HOSPITAL_COMMUNITY): Payer: 59

## 2023-03-08 ENCOUNTER — Other Ambulatory Visit: Payer: Self-pay

## 2023-03-08 ENCOUNTER — Emergency Department (HOSPITAL_COMMUNITY)
Admission: EM | Admit: 2023-03-08 | Discharge: 2023-03-08 | Disposition: A | Discharge status: Home / Self Care | Payer: 59 | Attending: Emergency Medicine | Admitting: Emergency Medicine

## 2023-03-08 DIAGNOSIS — Z79899 Other long term (current) drug therapy: Secondary | ICD-10-CM | POA: Insufficient documentation

## 2023-03-08 DIAGNOSIS — F10230 Alcohol dependence with withdrawal, uncomplicated: Secondary | ICD-10-CM | POA: Diagnosis not present

## 2023-03-08 DIAGNOSIS — E876 Hypokalemia: Secondary | ICD-10-CM | POA: Insufficient documentation

## 2023-03-08 DIAGNOSIS — R079 Chest pain, unspecified: Secondary | ICD-10-CM | POA: Diagnosis present

## 2023-03-08 DIAGNOSIS — F10930 Alcohol use, unspecified with withdrawal, uncomplicated: Secondary | ICD-10-CM

## 2023-03-08 DIAGNOSIS — I10 Essential (primary) hypertension: Secondary | ICD-10-CM | POA: Insufficient documentation

## 2023-03-08 LAB — COMPREHENSIVE METABOLIC PANEL
ALT: 12 U/L (ref 0–44)
AST: 18 U/L (ref 15–41)
Albumin: 3.2 g/dL — ABNORMAL LOW (ref 3.5–5.0)
Alkaline Phosphatase: 107 U/L (ref 38–126)
Anion gap: 15 (ref 5–15)
BUN: 7 mg/dL (ref 6–20)
CO2: 22 mmol/L (ref 22–32)
Calcium: 7.9 mg/dL — ABNORMAL LOW (ref 8.9–10.3)
Chloride: 104 mmol/L (ref 98–111)
Creatinine, Ser: 0.63 mg/dL (ref 0.61–1.24)
GFR, Estimated: >60 mL/min (ref >60)
Glucose, Bld: 82 mg/dL (ref 70–99)
Potassium: 3 mmol/L — ABNORMAL LOW (ref 3.5–5.1)
Sodium: 141 mmol/L (ref 135–145)
Total Bilirubin: 0.4 mg/dL (ref 0.3–1.2)
Total Protein: 5.9 g/dL — ABNORMAL LOW (ref 6.5–8.1)

## 2023-03-08 LAB — CBC WITH DIFFERENTIAL/PLATELET
Abs Immature Granulocytes: 0.02 10*3/uL (ref 0.00–0.07)
Basophils Absolute: 0 10*3/uL (ref 0.0–0.1)
Basophils Relative: 1 %
Eosinophils Absolute: 0.1 10*3/uL (ref 0.0–0.5)
Eosinophils Relative: 3 %
HCT: 39.7 % (ref 39.0–52.0)
Hemoglobin: 12.3 g/dL — ABNORMAL LOW (ref 13.0–17.0)
Immature Granulocytes: 0 %
Lymphocytes Relative: 29 %
Lymphs Abs: 1.6 10*3/uL (ref 0.7–4.0)
MCH: 26 pg (ref 26.0–34.0)
MCHC: 31 g/dL (ref 30.0–36.0)
MCV: 83.9 fL (ref 80.0–100.0)
Monocytes Absolute: 0.6 10*3/uL (ref 0.1–1.0)
Monocytes Relative: 12 %
Neutro Abs: 3 10*3/uL (ref 1.7–7.7)
Neutrophils Relative %: 55 %
Platelets: 221 10*3/uL (ref 150–400)
RBC: 4.73 MIL/uL (ref 4.22–5.81)
RDW: 14.8 % (ref 11.5–15.5)
WBC: 5.4 10*3/uL (ref 4.0–10.5)
nRBC: 0 % (ref 0.0–0.2)

## 2023-03-08 LAB — TROPONIN I (HIGH SENSITIVITY)
Troponin I (High Sensitivity): 8 ng/L (ref <18)
Troponin I (High Sensitivity): 9 ng/L (ref <18)

## 2023-03-08 LAB — RAPID URINE DRUG SCREEN, HOSP PERFORMED
Amphetamines: NOT DETECTED
Barbiturates: NOT DETECTED
Benzodiazepines: POSITIVE — AB
Cocaine: POSITIVE — AB
Opiates: NOT DETECTED
Tetrahydrocannabinol: NOT DETECTED

## 2023-03-08 LAB — MAGNESIUM: Magnesium: 1.9 mg/dL (ref 1.7–2.4)

## 2023-03-08 MED ORDER — THIAMINE HCL 100 MG/ML IJ SOLN: 100.0000 mg | Freq: Every day | INTRAMUSCULAR | Status: DC

## 2023-03-08 MED ORDER — FOLIC ACID 1 MG PO TABS
1.0000 mg | ORAL_TABLET | Freq: Every day | ORAL | Status: DC
  Administered 2023-03-08: 1 mg via ORAL
  Filled 2023-03-08: qty 1

## 2023-03-08 MED ORDER — CHLORDIAZEPOXIDE HCL 25 MG PO CAPS
25.0000 mg | ORAL_CAPSULE | Freq: Once | ORAL | Status: AC
  Administered 2023-03-08: 25 mg via ORAL
  Filled 2023-03-08: qty 1

## 2023-03-08 MED ORDER — THIAMINE MONONITRATE 100 MG PO TABS
100.0000 mg | ORAL_TABLET | Freq: Every day | ORAL | Status: DC
  Administered 2023-03-08: 100 mg via ORAL
  Filled 2023-03-08: qty 1

## 2023-03-08 MED ORDER — POTASSIUM CHLORIDE CRYS ER 20 MEQ PO TBCR
40.0000 meq | EXTENDED_RELEASE_TABLET | Freq: Once | ORAL | Status: AC
  Administered 2023-03-08: 40 meq via ORAL
  Filled 2023-03-08: qty 2

## 2023-03-08 MED ORDER — ADULT MULTIVITAMIN W/MINERALS CH
1.0000 | ORAL_TABLET | Freq: Every day | ORAL | Status: DC
  Administered 2023-03-08: 1 via ORAL
  Filled 2023-03-08: qty 1

## 2023-03-08 MED ORDER — CHLORDIAZEPOXIDE HCL 25 MG PO CAPS: ORAL_CAPSULE | ORAL | 0 refills | Status: DC

## 2023-03-08 NOTE — ED Notes (Signed)
Marks noted all over bilateral arms from hands to forearms.

## 2023-03-08 NOTE — Discharge Instructions (Addendum)
                   Outpatient Substance Abuse  Treatment- uninsured  Narcotics Anonymous 24-HOUR HELPLINE Pre-recorded for Meeting Schedules [facility] [phone]  [email or web address] ALCOHOLICS ANONYMOUS  [place]  Answering Service [phone] Please Note: All [place] Meetings are Non-smoking [email or web address]  Alcohol and Drug Services -  Insurance: Medicaid /State funding/private insurance Methadone, suboxone/Intensive outpatient  [place] [phone] Fax: [phone] [address], [place], [place], [address] [place] [phone] Fax: [phone]    [address], [place], [place], [address] (Serves [place], [place], [place], [place], [place], [place], [place], [place]) [facility] [email or web address]/ Accepts State funding/Medicaid Transitional housing, Intensive Outpatient Treatment, Outpatient treatment, Veterans Services  Phone: [phone] Fax: [phone] Address: [address], [place] [address]   [facility] (http://[email or web address][email or web address]/) Insurance: Medicaid Case Management, Community Support Team, Medication Management, Outpatient Therapy, Psychosocial Rehabilitation, Substance Abuse Intensive Outpatient  Phone: [phone] Fax: [phone] [address], [place], [place], [address]  [facility] Medicaid, most private insurance providers Types of Program: Individual/Group Therapy, Substance Abuse Treatment  Phone: [address] [phone] Fax: [phone] [address], [place], [place], [address] [place] [phone] [address], [place], [place], [address]  [facility]  Medicaid Types of Program: SAIOP  Phone: [phone] Fax: [phone] [address], [place], [place], [address] [facility] Medicaid/state funds Crisis line [phone] [place]-[address] [phone] [place] -[address] [phone]  [facility] [address];  [place], [place] [address] [phone]  Substance Abuse Intensive Outpatient Program [facility] [address] [place], [place] [address] [phone]- Substance abuse treatment  Successful Transitions  Insurance: [place] Medicaid, BCBS, sliding scale Types of Program: substance abuse treatment, transportation assistance Phone: [phone] Fax: [phone] Address: [address], [place] [address] [facility] ([email or web address]/) Insurance: UHC, BCBS, Aetna, Medicaid of [place] Program: addiction counseling, detoxification,  Phone: [phone]  Fax: [phone] Address: [address], [place] [address]  [facility] (statewide facilities/programs) [address] (Medicaid/state funds) [place], [place] [address]                      [facility] [phone] [place]- [phone] [place]- [phone] [facility] (2 Locations) (Medicaid/state funds) --[address]  walk in 8:30-12 and 1-2:30 , [id number]   PH- [phone] --[address] [place], [place] [address]  [phone] walk in 8:30-12 and 2-3:30  [facility] state funds/medicaid [address]  [place], [place] [address] [phone] [facility] (Suboxone clinic) Medicaid/state funds  [address]  [place], [place] [address] [phone]   [facility]  [place], [place], [place] [address]  [phone] (24 hours) [place]- [address] [place], [place] [address]  [phone] (24 hours) [place]- [address] [place] [phone] [place]- [address] [place] [phone] [place]- [address] [place] [address] [place] [phone] [facility]- Medicaid and state funds  [place]- [address] [place], [place] [address] [phone] (24 hours) [place]- [address] [place], [place]  [address] [phone] [place]- [address] [place], [place] [address] [phone] (24 hours) [place] [address] [place], [place]  [address] [phone] [place]- [address]  [phone]    

## 2023-03-08 NOTE — ED Provider Notes (Signed)
[place] EMERGENCY DEPARTMENT AT [facility] Provider Note   CSN: [id number] Arrival date & time: 03/08/23  1154     History  Chief Complaint  Patient presents with   Chest Pain    [name] is a 52 y.o. male with PMHx alcohol/substance use disorder, anxiety, Afib, bipolar 1, HLD, HTN who presents to ED concerned for left sided chest pain for 45 min.  Patient reports EMS gave him ASA.  Patient states that this chest pain happens whenever he heavily drinks.  Patient endorses heavily drinking over the past 2 days.  Patient also endorses drinking a minimum of 7 beers daily for the past year.  Reports last drink 7 hours ago.  Patient also reports using cocaine 45 minutes ago.  Denies cough, dyspnea, nausea, vomiting.  Does have multiple presentations with similar complaints.   Chest Pain      Home Medications Prior to Admission medications   Medication Sig Start Date End Date Taking? Authorizing Provider  amLODipine (NORVASC) 5 MG tablet Take 1 tablet (5 mg total) by mouth daily. 01/05/23 03/04/23  Zigmund Daniel., MD  cariprazine (VRAYLAR) 1.5 MG capsule Take 1 capsule (1.5 mg total) by mouth daily. Patient not taking: Reported on 03/04/2023 02/05/23   Lewie Chamber, MD  cariprazine (VRAYLAR) 1.5 MG capsule Take 1 capsule (1.5 mg total) by mouth daily. 02/14/23   Lewie Chamber, MD  chlordiazePOXIDE (LIBRIUM) 25 MG capsule 50mg  PO TID x 1D, then 25-50mg  PO BID X 1D, then 25-50mg  PO QD X 1D 03/08/23  Yes Valrie Hart F, PA-C      Allergies    Metoprolol    Review of Systems   Review of Systems  Cardiovascular:  Positive for chest pain.    Physical Exam Updated Vital Signs BP 116/75   Pulse 73   Temp 98.8 F (37.1 C) (Oral)   Resp 17   Ht 5\' 6"  (1.676 m)   Wt 113.4 kg   SpO2 99%   BMI 40.35 kg/m  Physical Exam Vitals and nursing note reviewed.  Constitutional:      General: He is not in acute distress. HENT:     Head: Normocephalic and  atraumatic.     Mouth/Throat:     Mouth: Mucous membranes are moist.     Pharynx: No oropharyngeal exudate or posterior oropharyngeal erythema.  Eyes:     General: No scleral icterus.       Right eye: No discharge.        Left eye: No discharge.     Conjunctiva/sclera: Conjunctivae normal.  Cardiovascular:     Rate and Rhythm: Normal rate and regular rhythm.     Pulses: Normal pulses.          Radial pulses are 2+ on the right side and 2+ on the left side.       Dorsalis pedis pulses are 2+ on the right side and 2+ on the left side.     Heart sounds: Normal heart sounds. No murmur heard. Pulmonary:     Effort: Pulmonary effort is normal. No respiratory distress.     Breath sounds: Normal breath sounds. No wheezing, rhonchi or rales.  Abdominal:     Tenderness: There is no abdominal tenderness.  Musculoskeletal:     Right lower leg: No edema.     Left lower leg: No edema.  Skin:    General: Skin is warm and dry.     Findings: No rash.  Neurological:  General: No focal deficit present.     Mental Status: He is alert. Mental status is at baseline.     ED Results / Procedures / Treatments   Labs (all labs ordered are listed, but only abnormal results are displayed) Labs Reviewed  CBC WITH DIFFERENTIAL/PLATELET - Abnormal; Notable for the following components:      Result Value   Hemoglobin 12.3 (*)    All other components within normal limits  COMPREHENSIVE METABOLIC PANEL - Abnormal; Notable for the following components:   Potassium 3.0 (*)    Calcium 7.9 (*)    Total Protein 5.9 (*)    Albumin 3.2 (*)    All other components within normal limits  MAGNESIUM  RAPID URINE DRUG SCREEN, HOSP PERFORMED  TROPONIN I (HIGH SENSITIVITY)  TROPONIN I (HIGH SENSITIVITY)    EKG EKG Interpretation Date/Time:  Sunday March 08 2023 12:14:03 EDT Ventricular Rate:  79 PR Interval:  184 QRS Duration:  96 QT Interval:  368 QTC Calculation: 422 R Axis:   -6  Text  Interpretation: Sinus rhythm Atrial premature complex Anterior infarct, old No significant change since last tracing Confirmed by Alvira Monday ([id number]) on 03/08/2023 12:17:52 PM  Radiology DG Chest Port 1 View  Result Date: 03/08/2023 CLINICAL DATA:  Chest painchest pain EXAM: PORTABLE CHEST 1 VIEW COMPARISON:  None Available. FINDINGS: Normal mediastinum and cardiac silhouette. Normal pulmonary vasculature. No evidence of effusion, infiltrate, or pneumothorax. No acute bony abnormality. IMPRESSION: No acute cardiopulmonary process. Electronically Signed   By: Genevive Bi M.D.   On: 03/08/2023 12:21    Procedures Procedures    Medications Ordered in ED Medications  thiamine (VITAMIN B1) tablet 100 mg (100 mg Oral Given 03/08/23 1246)    Or  thiamine (VITAMIN B1) injection 100 mg ( Intravenous See Alternative 03/08/23 1246)  folic acid (FOLVITE) tablet 1 mg (1 mg Oral Given 03/08/23 1247)  multivitamin with minerals tablet 1 tablet (1 tablet Oral Given 03/08/23 1247)  chlordiazePOXIDE (LIBRIUM) capsule 25 mg (has no administration in time range)  potassium chloride SA (KLOR-CON M) CR tablet 40 mEq (40 mEq Oral Given 03/08/23 1445)    ED Course/ Medical Decision Making/ A&P                             Medical Decision Making Amount and/or Complexity of Data Reviewed Labs: ordered. Radiology: ordered.  Risk OTC drugs. Prescription drug management.   This patient presents to the ED for concern of chest pain, this involves an extensive number of treatment options, and is a complaint that carries with it a high risk of complications and morbidity.  The differential diagnosis includes acute coronary syndrome, congestive heart failure, pericarditis, pneumonia, pulmonary embolism, tension pneumothorax, esophageal rupture, aortic dissection, cardiac tamponade, musculoskeletal   Co morbidities that complicate the patient evaluation  alcohol/substance use disorder, anxiety, Afib, bipolar 1,  HLD, HTN    Lab Tests:  I Ordered, and personally interpreted labs.  The pertinent results include:  - Troponin: Initial and repeat within normal limits - CMP: Hypokalemia (3.0) - CBC: No concern for anemia or leukocytosis - Mag: Within normal limits   Imaging Studies ordered:  I ordered imaging studies including  - chest xray: To assess for process contributing to patient's symptoms I independently visualized and interpreted imaging I agree with the radiologist interpretation   Cardiac Monitoring: / EKG:  The patient was maintained on a cardiac monitor.  I personally  viewed and interpreted the cardiac monitored which showed an underlying rhythm of: Sinus rhythm with one PAC.  No ST changes.   Problem List / ED Course / Critical interventions / Medication management  Patient presented for chest pain.  Patient was given ASA by EMS.  Patient states that chest pain started after cocaine.  Patient also concern for alcohol withdrawals.  States that his last drink was 7 hours prior to ED arrival.  Patient with multiple other ED visits with same complaint. EKG and troponins within normal limits.  BMP with hypokalemia at 3.0.  Provided patient with oral potassium supplement in the emergency room.  Chest x-ray without acute cardiopulmonary disease. Physical exam unremarkable-no signs of alcohol withdrawal complications at this time.  Patient stating that he is interested in quitting alcohol.  Provided patient with resources and Librium taper.  Also provided patient with thiamine, folic acid, multivitamin in ED today.   I have reviewed the patients home medicines and have made adjustments as needed Patient was given return precautions. Patient stable for discharge at this time.  Patient verbalized understanding of plan.  Ddx:  These are considered less likely due to history of present illness and physical exam findings.  Acute coronary syndrome: EKG and troponins within normal limits   Congestive heart failure: patient denies orthopnea, cough, and leg edema Pericarditis: pain is not positional and patient denies orthopnea and recent illness Pneumonia: lungs are clear to auscultation bilaterally Pulmonary embolism: no recent surgeries, blood clot hx, hemoptysis, cancer hx, vitals stable Pneumothorax: lungs are clear to auscultation bilaterally Esophageal rupture: patient denies vomiting, heavy drinking, and hx of GERD Aortic dissection: vital signs are stable, no variation in pulse pressure Cardiac tamponade: absence of hypotension, JVD, and muffled heart sounds Musculoskeletal:   Social Determinants of Health:  none          Final Clinical Impression(s) / ED Diagnoses Final diagnoses:  Chest pain, unspecified type  Alcohol withdrawal syndrome without complication [facility])    Rx / DC Orders ED Discharge Orders          Ordered    chlordiazePOXIDE (LIBRIUM) 25 MG capsule        03/08/23 1541              Dorthy Cooler, [place] 03/08/23 1544    Alvira Monday, MD 03/09/23 2101

## 2023-03-08 NOTE — ED Triage Notes (Signed)
Pt BIB [facility] from home due to chest pain.  Pt reports he has left sided chest pain that radiates to left shoulder.  Heavy alcohol use the last two days along with using cocaine in the last 45 minutes today 03/08/23.  Hx anxiety.  20g left AC.  NS.  VS BP 136/70, HR 80, Spo2 98% RA, Resp 20

## 2023-03-10 ENCOUNTER — Encounter (HOSPITAL_COMMUNITY): Payer: Self-pay | Admitting: Emergency Medicine

## 2023-03-10 ENCOUNTER — Emergency Department (HOSPITAL_COMMUNITY)
Admission: EM | Admit: 2023-03-10 | Discharge: 2023-03-10 | Disposition: A | Discharge status: Home / Self Care | Payer: 59 | Attending: Emergency Medicine | Admitting: Emergency Medicine

## 2023-03-10 DIAGNOSIS — Z79899 Other long term (current) drug therapy: Secondary | ICD-10-CM | POA: Insufficient documentation

## 2023-03-10 DIAGNOSIS — J45909 Unspecified asthma, uncomplicated: Secondary | ICD-10-CM | POA: Diagnosis not present

## 2023-03-10 DIAGNOSIS — T675XXA Heat exhaustion, unspecified, initial encounter: Secondary | ICD-10-CM | POA: Insufficient documentation

## 2023-03-10 DIAGNOSIS — I10 Essential (primary) hypertension: Secondary | ICD-10-CM | POA: Insufficient documentation

## 2023-03-10 DIAGNOSIS — T679XXA Effect of heat and light, unspecified, initial encounter: Secondary | ICD-10-CM

## 2023-03-10 NOTE — ED Triage Notes (Addendum)
PT was waiting for ride for hour and now feels weak. EMS said pt sweating on scene. Temp elevated. No complaints. ETOH last drink 4 hours ago. Cold water given.

## 2023-03-10 NOTE — ED Provider Notes (Signed)
[place] EMERGENCY DEPARTMENT AT [facility] Provider Note   CSN: [id number] Arrival date & time: 03/10/23  1937     History  Chief Complaint  Patient presents with   Heat Exposure    [name] is a 52 y.o. male history of alcohol/substance use disorder, anxiety, bipolar 1, hypertension, hyperlipidemia presents today for evaluation of fatigue.  States he been feeling fatigue today after being out in the sun for prolonged period of time.  He denies fever, vomiting, chest pain, shortness of breath, abdominal pain, urinary symptoms, rash.  HPI    Past Medical History:  Diagnosis Date   Alcohol dependence with withdrawal (HCC)    Anxiety    Arrhythmia    Asthma    Atrial fibrillation (HCC)    not on AC   Bipolar 1 disorder (HCC)    Class 2 obesity    Cocaine abuse (HCC)    Depression    Heart palpitations    with severe anxiety   Hyperlipidemia    Hypertension    PTSD (post-traumatic stress disorder)    PTSD (post-traumatic stress disorder)    Syncope and collapse    Past Surgical History:  Procedure Laterality Date   NO PAST SURGERIES       Home Medications Prior to Admission medications   Medication Sig Start Date End Date Taking? Authorizing Provider  amLODipine (NORVASC) 5 MG tablet Take 1 tablet (5 mg total) by mouth daily. 01/05/23 03/04/23  Zigmund Daniel., MD  cariprazine (VRAYLAR) 1.5 MG capsule Take 1 capsule (1.5 mg total) by mouth daily. Patient not taking: Reported on 03/04/2023 02/05/23   Lewie Chamber, MD  cariprazine (VRAYLAR) 1.5 MG capsule Take 1 capsule (1.5 mg total) by mouth daily. 02/14/23   Lewie Chamber, MD  chlordiazePOXIDE (LIBRIUM) 25 MG capsule 50mg  PO TID x 1D, then 25-50mg  PO BID X 1D, then 25-50mg  PO QD X 1D 03/08/23   Dorthy Cooler, PA-C      Allergies    Metoprolol    Review of Systems   Review of Systems Negative except as per HPI.  Physical Exam Updated Vital Signs BP (!) 111/91 (BP Location: Right  Arm)   Pulse (!) 102   Temp 99.6 F (37.6 C) (Oral)   Resp 18   SpO2 96%  Physical Exam Vitals and nursing note reviewed.  Constitutional:      Appearance: Normal appearance.  HENT:     Head: Normocephalic and atraumatic.     Mouth/Throat:     Mouth: Mucous membranes are moist.  Eyes:     General: No scleral icterus. Cardiovascular:     Rate and Rhythm: Normal rate and regular rhythm.     Pulses: Normal pulses.     Heart sounds: Normal heart sounds.  Pulmonary:     Effort: Pulmonary effort is normal.     Breath sounds: Normal breath sounds.  Abdominal:     General: Abdomen is flat.     Palpations: Abdomen is soft.     Tenderness: There is no abdominal tenderness.  Musculoskeletal:        General: No deformity.  Skin:    General: Skin is warm.     Findings: No rash.  Neurological:     General: No focal deficit present.     Mental Status: He is alert.  Psychiatric:        Mood and Affect: Mood normal.     ED Results / Procedures / Treatments  Labs (all labs ordered are listed, but only abnormal results are displayed) Labs Reviewed - No data to display  EKG None  Radiology No results found.  Procedures Procedures    Medications Ordered in ED Medications - No data to display  ED Course/ Medical Decision Making/ A&P                             Medical Decision Making  52 year old male presents with chief complaint of fatigue.  Patient states he was in the sun for a prolonged period of time today and has been having fatigue.  On physical examination patient denies any headache, dizziness, chest pain, shortness of breath, nausea or vomiting.  States he has felt much better after being given water in triage. Advised patient to follow-up with primary care physician for further evaluation and management, return to the ER if new or worsening symptoms.  Disposition Continued outpatient therapy. Follow-up with PCP recommended for reevaluation of symptoms.  Treatment plan discussed with patient.  Pt acknowledged understanding was agreeable to the plan. Worrisome signs and symptoms were discussed with patient, and patient acknowledged understanding to return to the ED if they noticed these signs and symptoms. Patient was stable upon discharge.   This chart was dictated using voice recognition software.  Despite best efforts to proofread,  errors can occur which can change the documentation meaning.          Final Clinical Impression(s) / ED Diagnoses Final diagnoses:  Heat exposure, initial encounter    Rx / DC Orders ED Discharge Orders     None         Jeanelle Malling, [place] 03/10/23 2032    Derwood Kaplan, MD 03/11/23 [phone]

## 2023-03-10 NOTE — ED Notes (Addendum)
Pt drank 2 8 oz cups of ice water. Pitcher of ice water given and pt instructed to drink.

## 2023-03-15 ENCOUNTER — Encounter (HOSPITAL_COMMUNITY): Payer: Self-pay

## 2023-03-15 ENCOUNTER — Emergency Department (HOSPITAL_COMMUNITY)
Admission: EM | Admit: 2023-03-15 | Discharge: 2023-03-16 | Disposition: A | Discharge status: Home / Self Care | Payer: 59 | Source: Home / Self Care | Attending: Emergency Medicine | Admitting: Emergency Medicine

## 2023-03-15 ENCOUNTER — Emergency Department (HOSPITAL_COMMUNITY): Payer: 59

## 2023-03-15 ENCOUNTER — Other Ambulatory Visit: Payer: Self-pay

## 2023-03-15 DIAGNOSIS — R45851 Suicidal ideations: Secondary | ICD-10-CM | POA: Insufficient documentation

## 2023-03-15 DIAGNOSIS — F1910 Other psychoactive substance abuse, uncomplicated: Secondary | ICD-10-CM | POA: Insufficient documentation

## 2023-03-15 DIAGNOSIS — Z79899 Other long term (current) drug therapy: Secondary | ICD-10-CM | POA: Insufficient documentation

## 2023-03-15 DIAGNOSIS — I10 Essential (primary) hypertension: Secondary | ICD-10-CM | POA: Insufficient documentation

## 2023-03-15 DIAGNOSIS — F1020 Alcohol dependence, uncomplicated: Secondary | ICD-10-CM | POA: Insufficient documentation

## 2023-03-15 DIAGNOSIS — F1420 Cocaine dependence, uncomplicated: Secondary | ICD-10-CM | POA: Insufficient documentation

## 2023-03-15 DIAGNOSIS — F323 Major depressive disorder, single episode, severe with psychotic features: Secondary | ICD-10-CM | POA: Insufficient documentation

## 2023-03-15 DIAGNOSIS — F4310 Post-traumatic stress disorder, unspecified: Secondary | ICD-10-CM | POA: Insufficient documentation

## 2023-03-15 DIAGNOSIS — R079 Chest pain, unspecified: Secondary | ICD-10-CM | POA: Insufficient documentation

## 2023-03-15 DIAGNOSIS — E876 Hypokalemia: Secondary | ICD-10-CM | POA: Insufficient documentation

## 2023-03-15 DIAGNOSIS — F1914 Other psychoactive substance abuse with psychoactive substance-induced mood disorder: Secondary | ICD-10-CM | POA: Insufficient documentation

## 2023-03-15 DIAGNOSIS — F333 Major depressive disorder, recurrent, severe with psychotic symptoms: Secondary | ICD-10-CM | POA: Diagnosis not present

## 2023-03-15 LAB — COMPREHENSIVE METABOLIC PANEL
ALT: 12 U/L (ref 0–44)
AST: 19 U/L (ref 15–41)
Albumin: 3.7 g/dL (ref 3.5–5.0)
Alkaline Phosphatase: 109 U/L (ref 38–126)
Anion gap: 16 — ABNORMAL HIGH (ref 5–15)
BUN: 5 mg/dL — ABNORMAL LOW (ref 6–20)
CO2: 23 mmol/L (ref 22–32)
Calcium: 8.8 mg/dL — ABNORMAL LOW (ref 8.9–10.3)
Chloride: 100 mmol/L (ref 98–111)
Creatinine, Ser: 0.98 mg/dL (ref 0.61–1.24)
GFR, Estimated: >60 mL/min (ref >60)
Glucose, Bld: 91 mg/dL (ref 70–99)
Potassium: 3.2 mmol/L — ABNORMAL LOW (ref 3.5–5.1)
Sodium: 139 mmol/L (ref 135–145)
Total Bilirubin: 1.1 mg/dL (ref 0.3–1.2)
Total Protein: 6.9 g/dL (ref 6.5–8.1)

## 2023-03-15 LAB — CBC WITH DIFFERENTIAL/PLATELET
Abs Immature Granulocytes: 0.03 10*3/uL (ref 0.00–0.07)
Basophils Absolute: 0 10*3/uL (ref 0.0–0.1)
Basophils Relative: 1 %
Eosinophils Absolute: 0.1 10*3/uL (ref 0.0–0.5)
Eosinophils Relative: 1 %
HCT: 42.6 % (ref 39.0–52.0)
Hemoglobin: 13.8 g/dL (ref 13.0–17.0)
Immature Granulocytes: 1 %
Lymphocytes Relative: 21 %
Lymphs Abs: 1.3 10*3/uL (ref 0.7–4.0)
MCH: 26.7 pg (ref 26.0–34.0)
MCHC: 32.4 g/dL (ref 30.0–36.0)
MCV: 82.4 fL (ref 80.0–100.0)
Monocytes Absolute: 0.6 10*3/uL (ref 0.1–1.0)
Monocytes Relative: 10 %
Neutro Abs: 4 10*3/uL (ref 1.7–7.7)
Neutrophils Relative %: 66 %
Platelets: 236 10*3/uL (ref 150–400)
RBC: 5.17 MIL/uL (ref 4.22–5.81)
RDW: 14.2 % (ref 11.5–15.5)
WBC: 6.1 10*3/uL (ref 4.0–10.5)
nRBC: 0 % (ref 0.0–0.2)

## 2023-03-15 LAB — TROPONIN I (HIGH SENSITIVITY): Troponin I (High Sensitivity): 6 ng/L (ref <18)

## 2023-03-15 NOTE — ED Provider Notes (Signed)
[place] EMERGENCY DEPARTMENT AT [facility] Provider Note   CSN: [id number] Arrival date & time: 03/15/23  2139     History  Chief Complaint  Patient presents with   Chest Pain    [name] is a 52 y.o. male who presents via EMS with concern for chest pain that started shortly after using cocaine 1 hour prior to arrival.  Pain radiates into the neck and shoulder.  No shortness of breath.  Of note history is limited by patient's intoxication.  States that he was using alcohol today as well.  I have reviewed his medical records.  History of hypertension, polysubstance use, depression. Hx of multiple admissions this year for ETOH withdrawal.  HPI     Home Medications Prior to Admission medications   Medication Sig Start Date End Date Taking? Authorizing Provider  amLODipine (NORVASC) 5 MG tablet Take 1 tablet (5 mg total) by mouth daily. 01/05/23 03/04/23  Zigmund Daniel., MD  cariprazine (VRAYLAR) 1.5 MG capsule Take 1 capsule (1.5 mg total) by mouth daily. Patient not taking: Reported on 03/04/2023 02/05/23   Lewie Chamber, MD  cariprazine (VRAYLAR) 1.5 MG capsule Take 1 capsule (1.5 mg total) by mouth daily. 02/14/23   Lewie Chamber, MD  chlordiazePOXIDE (LIBRIUM) 25 MG capsule 50mg  PO TID x 1D, then 25-50mg  PO BID X 1D, then 25-50mg  PO QD X 1D 03/08/23   Dorthy Cooler, PA-C      Allergies    Metoprolol    Review of Systems   Review of Systems  Cardiovascular:  Positive for chest pain.    Physical Exam Updated Vital Signs BP (!) 123/94 (BP Location: Left Arm)   Pulse 92   Temp 98.1 F (36.7 C) (Oral)   Resp 20   SpO2 96%  Physical Exam Vitals and nursing note reviewed.  Constitutional:      Appearance: He is obese. He is not ill-appearing or toxic-appearing.  HENT:     Head: Normocephalic and atraumatic.     Mouth/Throat:     Mouth: Mucous membranes are moist.     Pharynx: No oropharyngeal exudate or posterior oropharyngeal erythema.   Eyes:     General:        Right eye: No discharge.        Left eye: No discharge.     Extraocular Movements: Extraocular movements intact.     Conjunctiva/sclera: Conjunctivae normal.     Pupils: Pupils are equal, round, and reactive to light.  Cardiovascular:     Rate and Rhythm: Normal rate and regular rhythm.     Pulses: Normal pulses.     Heart sounds: Normal heart sounds. No murmur heard. Pulmonary:     Effort: Pulmonary effort is normal. No respiratory distress.     Breath sounds: Normal breath sounds. No wheezing or rales.  Chest:     Chest wall: No mass, tenderness or edema.  Abdominal:     General: Bowel sounds are normal. There is no distension.     Tenderness: There is no abdominal tenderness.  Musculoskeletal:        General: No deformity.     Cervical back: Neck supple.     Right lower leg: No edema.     Left lower leg: No edema.  Skin:    General: Skin is warm and dry.     Capillary Refill: Capillary refill takes less than 2 seconds.  Neurological:     General: No focal deficit present.  Mental Status: He is alert and oriented to person, place, and time. Mental status is at baseline.  Psychiatric:        Mood and Affect: Mood normal.    ED Results / Procedures / Treatments   Labs (all labs ordered are listed, but only abnormal results are displayed) Labs Reviewed - No data to display  EKG None  Radiology No results found.  Procedures Procedures    Medications Ordered in ED Medications - No data to display  ED Course/ Medical Decision Making/ A&P                             Medical Decision Making 52 y/o male with CP, post cocaine.   HTN on intake, VS otherwise normal. Cardiopulmonary exam is benign, abdominal pain is benign.Intoxicated.   DDX includes but is not limited to to ACS, PE, dysrhythmias, PNA, pleurisy, MSK pain, cocaine induced Vasospasm.  Amount and/or Complexity of Data Reviewed Labs: ordered.    Details: CBC without  acidosis or anemia, CMP with hypokalemia of 3.2, troponin -6, delta troponin5.  Radiology: ordered.    Details:  Chest x-ray negative for acute cardiopulmonary disease. ECG/medicine tests:     Details:  EKG with normal sinus rhythm, no STEMI.  Risk Prescription drug management.   Patient's Payee, Vickki Muff, states she would like him admitted to psych for detox from his substances. Patient without SI/HI/AVH/ acute psychosis in the ED; no indication for IVC or emergent psychiatric consultation. Will offer information for [facility].    Workup reassuring, doubt ACS. Clinical concern for emergent underlying etiology that would warrant further ED workup or inpatient management is exceedingly low. Suspect symptoms contributed to by ETOH and cocaine use.   Darnel  voiced understanding of her medical evaluation and treatment plan. Each of their questions answered to their expressed satisfaction.  Return precautions were given.  Patient is well-appearing, stable, and was discharged in good condition.  This chart was dictated using voice recognition software, Dragon. Despite the best efforts of this provider to proofread and correct errors, errors may still occur which can change documentation meaning.  Final Clinical Impression(s) / ED Diagnoses Final diagnoses:  None    Rx / DC Orders ED Discharge Orders     None         Sherrilee Gilles 03/16/23 0232    Charlynne Pander, MD 03/17/23 1454

## 2023-03-15 NOTE — ED Triage Notes (Signed)
Patient used cocaine 1 hour ago and now has neck tightness and chest tightness. Patient states has a hx of MI. Patient was given 324 of aspirin. Vitals are HR 100, RR, 18, BP 138/90 CGB 89. No IV access.

## 2023-03-16 ENCOUNTER — Emergency Department (HOSPITAL_COMMUNITY)
Admission: EM | Admit: 2023-03-16 | Discharge: 2023-03-17 | Disposition: A | Discharge status: Other Acute Inpatient Hospital | Payer: 59 | Source: Home / Self Care | Attending: Emergency Medicine | Admitting: Emergency Medicine

## 2023-03-16 DIAGNOSIS — R443 Hallucinations, unspecified: Secondary | ICD-10-CM

## 2023-03-16 DIAGNOSIS — R45851 Suicidal ideations: Secondary | ICD-10-CM | POA: Insufficient documentation

## 2023-03-16 DIAGNOSIS — I10 Essential (primary) hypertension: Secondary | ICD-10-CM | POA: Insufficient documentation

## 2023-03-16 DIAGNOSIS — Y900 Blood alcohol level of less than 20 mg/100 ml: Secondary | ICD-10-CM | POA: Insufficient documentation

## 2023-03-16 DIAGNOSIS — F323 Major depressive disorder, single episode, severe with psychotic features: Secondary | ICD-10-CM | POA: Insufficient documentation

## 2023-03-16 DIAGNOSIS — F4310 Post-traumatic stress disorder, unspecified: Secondary | ICD-10-CM | POA: Insufficient documentation

## 2023-03-16 DIAGNOSIS — F1010 Alcohol abuse, uncomplicated: Secondary | ICD-10-CM | POA: Insufficient documentation

## 2023-03-16 DIAGNOSIS — F1420 Cocaine dependence, uncomplicated: Secondary | ICD-10-CM | POA: Insufficient documentation

## 2023-03-16 DIAGNOSIS — Z79899 Other long term (current) drug therapy: Secondary | ICD-10-CM | POA: Insufficient documentation

## 2023-03-16 DIAGNOSIS — F1910 Other psychoactive substance abuse, uncomplicated: Secondary | ICD-10-CM | POA: Insufficient documentation

## 2023-03-16 DIAGNOSIS — F1020 Alcohol dependence, uncomplicated: Secondary | ICD-10-CM | POA: Insufficient documentation

## 2023-03-16 DIAGNOSIS — F1914 Other psychoactive substance abuse with psychoactive substance-induced mood disorder: Secondary | ICD-10-CM | POA: Insufficient documentation

## 2023-03-16 LAB — COMPREHENSIVE METABOLIC PANEL
ALT: 12 U/L (ref 0–44)
AST: 18 U/L (ref 15–41)
Albumin: 3.3 g/dL — ABNORMAL LOW (ref 3.5–5.0)
Alkaline Phosphatase: 112 U/L (ref 38–126)
Anion gap: 15 (ref 5–15)
BUN: 5 mg/dL — ABNORMAL LOW (ref 6–20)
CO2: 25 mmol/L (ref 22–32)
Calcium: 8.4 mg/dL — ABNORMAL LOW (ref 8.9–10.3)
Chloride: 99 mmol/L (ref 98–111)
Creatinine, Ser: 0.87 mg/dL (ref 0.61–1.24)
GFR, Estimated: >60 mL/min (ref >60)
Glucose, Bld: 83 mg/dL (ref 70–99)
Potassium: 3 mmol/L — ABNORMAL LOW (ref 3.5–5.1)
Sodium: 139 mmol/L (ref 135–145)
Total Bilirubin: 1.1 mg/dL (ref 0.3–1.2)
Total Protein: 6.3 g/dL — ABNORMAL LOW (ref 6.5–8.1)

## 2023-03-16 LAB — URINALYSIS, ROUTINE W REFLEX MICROSCOPIC
Glucose, UA: NEGATIVE mg/dL
Ketones, ur: 20 mg/dL — AB
Leukocytes,Ua: NEGATIVE
Nitrite: NEGATIVE
Protein, ur: NEGATIVE mg/dL
Specific Gravity, Urine: 1.015 (ref 1.005–1.030)
pH: 5 (ref 5.0–8.0)

## 2023-03-16 LAB — CBC WITH DIFFERENTIAL/PLATELET
Abs Immature Granulocytes: 0.03 10*3/uL (ref 0.00–0.07)
Basophils Absolute: 0 10*3/uL (ref 0.0–0.1)
Basophils Relative: 1 %
Eosinophils Absolute: 0.2 10*3/uL (ref 0.0–0.5)
Eosinophils Relative: 6 %
HCT: 41.5 % (ref 39.0–52.0)
Hemoglobin: 13.3 g/dL (ref 13.0–17.0)
Immature Granulocytes: 1 %
Lymphocytes Relative: 36 %
Lymphs Abs: 1.4 10*3/uL (ref 0.7–4.0)
MCH: 27.2 pg (ref 26.0–34.0)
MCHC: 32 g/dL (ref 30.0–36.0)
MCV: 84.9 fL (ref 80.0–100.0)
Monocytes Absolute: 0.5 10*3/uL (ref 0.1–1.0)
Monocytes Relative: 12 %
Neutro Abs: 1.7 10*3/uL (ref 1.7–7.7)
Neutrophils Relative %: 44 %
Platelets: 221 10*3/uL (ref 150–400)
RBC: 4.89 MIL/uL (ref 4.22–5.81)
RDW: 14.4 % (ref 11.5–15.5)
WBC: 3.9 10*3/uL — ABNORMAL LOW (ref 4.0–10.5)
nRBC: 0 % (ref 0.0–0.2)

## 2023-03-16 LAB — TROPONIN I (HIGH SENSITIVITY): Troponin I (High Sensitivity): 5 ng/L (ref <18)

## 2023-03-16 LAB — ETHANOL: Alcohol, Ethyl (B): <10 mg/dL (ref <10)

## 2023-03-16 LAB — RAPID URINE DRUG SCREEN, HOSP PERFORMED
Amphetamines: NOT DETECTED
Barbiturates: NOT DETECTED
Benzodiazepines: POSITIVE — AB
Cocaine: POSITIVE — AB
Opiates: NOT DETECTED
Tetrahydrocannabinol: NOT DETECTED

## 2023-03-16 LAB — ACETAMINOPHEN LEVEL: Acetaminophen (Tylenol), Serum: <10 ug/mL — ABNORMAL LOW (ref 10–30)

## 2023-03-16 LAB — SALICYLATE LEVEL: Salicylate Lvl: <7 mg/dL — ABNORMAL LOW (ref 7.0–30.0)

## 2023-03-16 MED ORDER — POTASSIUM CHLORIDE CRYS ER 20 MEQ PO TBCR
40.0000 meq | EXTENDED_RELEASE_TABLET | Freq: Once | ORAL | Status: AC
  Administered 2023-03-16: 40 meq via ORAL
  Filled 2023-03-16: qty 2

## 2023-03-16 MED ORDER — CARIPRAZINE HCL 1.5 MG PO CAPS: 1.5000 mg | ORAL_CAPSULE | Freq: Every day | ORAL | Status: DC

## 2023-03-16 MED ORDER — LORAZEPAM 1 MG PO TABS
2.0000 mg | ORAL_TABLET | Freq: Four times a day (QID) | ORAL | Status: DC | PRN
  Administered 2023-03-17: 2 mg via ORAL
  Filled 2023-03-16: qty 2

## 2023-03-16 MED ORDER — AMLODIPINE BESYLATE 5 MG PO TABS
5.0000 mg | ORAL_TABLET | Freq: Every day | ORAL | Status: DC
  Administered 2023-03-17: 5 mg via ORAL
  Filled 2023-03-16: qty 1

## 2023-03-16 MED ORDER — POTASSIUM CHLORIDE CRYS ER 20 MEQ PO TBCR
20.0000 meq | EXTENDED_RELEASE_TABLET | Freq: Once | ORAL | Status: AC
  Administered 2023-03-16: 20 meq via ORAL
  Filled 2023-03-16: qty 1

## 2023-03-16 MED ORDER — CARIPRAZINE HCL 1.5 MG PO CAPS
1.5000 mg | ORAL_CAPSULE | Freq: Every day | ORAL | Status: DC
  Administered 2023-03-17 (×2): 1.5 mg via ORAL
  Filled 2023-03-16 (×3): qty 1

## 2023-03-16 MED ORDER — ALBUTEROL SULFATE HFA 108 (90 BASE) MCG/ACT IN AERS
2.0000 | INHALATION_SPRAY | Freq: Once | RESPIRATORY_TRACT | Status: AC
  Administered 2023-03-16: 2 via RESPIRATORY_TRACT
  Filled 2023-03-16: qty 6.7

## 2023-03-16 MED ORDER — HYDROXYZINE HCL 25 MG PO TABS
25.0000 mg | ORAL_TABLET | Freq: Once | ORAL | Status: AC
  Administered 2023-03-16: 25 mg via ORAL
  Filled 2023-03-16: qty 1

## 2023-03-16 NOTE — ED Notes (Signed)
Sitter at bedside. [employer] left bedside.

## 2023-03-16 NOTE — ED Provider Notes (Signed)
[place] EMERGENCY DEPARTMENT AT [facility] Provider Note   CSN: [id number] Arrival date & time: 03/16/23  1611     History {Add pertinent medical, surgical, social history, OB history to HPI:1} Chief Complaint  Patient presents with   Psychiatric Evaluation    [name] is a 52 y.o. male.  52 year old male with a history of polysubstance abuse including cocaine and alcohol, depression, and PTSD presents to the emergency department as IVC.  Patient was involuntarily committed by his mother-in-law after he was discharged from the emergency department today.  She reports that he is having hallucinations.  Patient states that he has been hearing sounds that sound like someone is in his room when they are not.  Has been using more cocaine recently.  Thinks this may be related.  Says that he is also been having thoughts that he would be better if he is not here.  Occasionally thinking about walking in front of cars.  Per IVC paperwork also has been seeing little people who appear to be falling around everywhere and dancing at the end of the bed.       Home Medications Prior to Admission medications   Medication Sig Start Date End Date Taking? Authorizing Provider  amLODipine (NORVASC) 5 MG tablet Take 1 tablet (5 mg total) by mouth daily. 01/05/23 03/04/23  Zigmund Daniel., MD  amLODipine (NORVASC) 5 MG tablet Take 5 mg by mouth daily.    [provider]  calcitRIOL (ROCALTROL) 0.5 MCG capsule Take 0.5 mcg by mouth daily.    [provider]  cariprazine (VRAYLAR) 1.5 MG capsule Take 1 capsule (1.5 mg total) by mouth daily. Patient not taking: Reported on 03/04/2023 02/05/23   Lewie Chamber, MD  cariprazine (VRAYLAR) 1.5 MG capsule Take 1 capsule (1.5 mg total) by mouth daily. 02/14/23   Lewie Chamber, MD  chlordiazePOXIDE (LIBRIUM) 25 MG capsule 50mg  PO TID x 1D, then 25-50mg  PO BID X 1D, then 25-50mg  PO QD X 1D Patient not taking: Reported on 03/15/2023  03/08/23   Dorthy Cooler, PA-C      Allergies    Metoprolol    Review of Systems   Review of Systems  Physical Exam Updated Vital Signs BP 133/86 (BP Location: Left Arm)   Pulse 65   Temp 98.8 F (37.1 C) (Oral)   Resp 14   SpO2 96%  Physical Exam Vitals and nursing note reviewed.  Constitutional:      General: He is not in acute distress.    Appearance: He is well-developed.  HENT:     Head: Normocephalic and atraumatic.     Right Ear: External ear normal.     Left Ear: External ear normal.     Nose: Nose normal.     Mouth/Throat:     Mouth: Mucous membranes are moist.     Pharynx: Oropharynx is clear.  Eyes:     Extraocular Movements: Extraocular movements intact.     Conjunctiva/sclera: Conjunctivae normal.     Pupils: Pupils are equal, round, and reactive to light.  Cardiovascular:     Rate and Rhythm: Normal rate and regular rhythm.     Heart sounds: Normal heart sounds.  Pulmonary:     Effort: Pulmonary effort is normal. No respiratory distress.     Breath sounds: Normal breath sounds.  Musculoskeletal:     Cervical back: Normal range of motion and neck supple.     Right lower leg: No edema.  Left lower leg: No edema.  Skin:    General: Skin is warm and dry.  Neurological:     Mental Status: He is alert and oriented to person, place, and time. Mental status is at baseline.     Cranial Nerves: No cranial nerve deficit.     Sensory: No sensory deficit.     Motor: No weakness.     Comments: No tremor or tongue fasciculations  Psychiatric:        Mood and Affect: Mood normal.        Behavior: Behavior normal.     ED Results / Procedures / Treatments   Labs (all labs ordered are listed, but only abnormal results are displayed) Labs Reviewed  COMPREHENSIVE METABOLIC PANEL  ETHANOL  RAPID URINE DRUG SCREEN, HOSP PERFORMED  CBC WITH DIFFERENTIAL/PLATELET  ACETAMINOPHEN LEVEL  SALICYLATE LEVEL  URINALYSIS, ROUTINE W REFLEX MICROSCOPIC     EKG None  Radiology DG Chest Portable 1 View  Result Date: 03/15/2023 CLINICAL DATA:  Chest pain EXAM: PORTABLE CHEST 1 VIEW COMPARISON:  Chest x-ray 03/08/2023 FINDINGS: The heart size and mediastinal contours are within normal limits. Both lungs are clear. The visualized skeletal structures are unremarkable. IMPRESSION: No active disease. Electronically Signed   By: Darliss Cheney M.D.   On: 03/15/2023 22:37    Procedures Procedures  {Document cardiac monitor, telemetry assessment procedure when appropriate:1}  Medications Ordered in ED Medications - No data to display  ED Course/ Medical Decision Making/ A&P   {   Click here for ABCD2, HEART and other calculatorsREFRESH Note before signing :1}                          Medical Decision Making Amount and/or Complexity of Data Reviewed Labs: ordered.   ***  {Document critical care time when appropriate:1} {Document review of labs and clinical decision tools ie heart score, Chads2Vasc2 etc:1}  {Document your independent review of radiology images, and any outside records:1} {Document your discussion with family members, caretakers, and with consultants:1} {Document social determinants of health affecting pt's care:1} {Document your decision making why or why not admission, treatments were needed:1} Final Clinical Impression(s) / ED Diagnoses Final diagnoses:  None    Rx / DC Orders ED Discharge Orders     None

## 2023-03-16 NOTE — Discharge Instructions (Signed)
You are seen in the ER today for chest pain.  Your workup was very reassuring.  There is not appear to be any emergent problem with your heart or lungs at this time.  Regarding your concern for substance use, please see the contact information for the behavioral health facility listed below.  They are open 24/7 and need to present to their facility for connection to resources for substance abuse.  Return to the ER with any new severe symptoms.

## 2023-03-16 NOTE — ED Notes (Signed)
Pt was placed in purple scrubs. Pt belongings are placed in bag (and labeled) and in locker 6.  Pt signed appropriate documentation.

## 2023-03-16 NOTE — ED Triage Notes (Signed)
Pt to ED via [employer]. Pt's mother in law reports pt hearing voices. Pt's mother in law also reports pt using cocaine. [facility] brought pt in [facility] with paperwork. Pt has a hx of anxiety, depression, substance abuse, bipolar, and PTSD.

## 2023-03-16 NOTE — ED Notes (Signed)
E-filing of our first exam and the outside filed Affid/Petition and Findings/Order accepted by Dagoberto Reef (See Envelope # [phone]); called Magistrate who informed me the Dagoberto Reef will have to assign a case number since the e-filing was after 2pm.  The docs are in [facility] Zone on clipboard (Patient in [facility]). I am not working tomorrow so one of our working secretaries will need to please follow-up with the [employer] office tomorrow to obtain the File No.

## 2023-03-16 NOTE — ED Notes (Signed)
Patient at desk making phone call to family [facility]

## 2023-03-16 NOTE — ED Notes (Signed)
TTS in process 

## 2023-03-16 NOTE — ED Notes (Signed)
 Pt wanded by security. 
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
## 2023-03-16 NOTE — ED Triage Notes (Signed)
Pt endorses SI. Pt states he does not have a plan to harm himself, but is having suicidal ideation.

## 2023-03-17 ENCOUNTER — Inpatient Hospital Stay (HOSPITAL_COMMUNITY)
Admission: AD | Admit: 2023-03-17 | Discharge: 2023-03-24 | Disposition: A | Discharge status: Other Acute Inpatient Hospital | Payer: 59 | Source: Intra-hospital | Attending: Psychiatry | Admitting: Psychiatry

## 2023-03-17 ENCOUNTER — Encounter (HOSPITAL_COMMUNITY): Payer: Self-pay | Admitting: Psychiatry

## 2023-03-17 ENCOUNTER — Other Ambulatory Visit: Payer: Self-pay

## 2023-03-17 DIAGNOSIS — F411 Generalized anxiety disorder: Secondary | ICD-10-CM | POA: Diagnosis present

## 2023-03-17 DIAGNOSIS — G4700 Insomnia, unspecified: Secondary | ICD-10-CM | POA: Diagnosis present

## 2023-03-17 DIAGNOSIS — F515 Nightmare disorder: Secondary | ICD-10-CM | POA: Diagnosis present

## 2023-03-17 DIAGNOSIS — F323 Major depressive disorder, single episode, severe with psychotic features: Secondary | ICD-10-CM | POA: Diagnosis present

## 2023-03-17 DIAGNOSIS — F1024 Alcohol dependence with alcohol-induced mood disorder: Secondary | ICD-10-CM | POA: Diagnosis present

## 2023-03-17 DIAGNOSIS — Z5982 Transportation insecurity: Secondary | ICD-10-CM | POA: Diagnosis not present

## 2023-03-17 DIAGNOSIS — Z5941 Food insecurity: Secondary | ICD-10-CM | POA: Diagnosis not present

## 2023-03-17 DIAGNOSIS — F1510 Other stimulant abuse, uncomplicated: Secondary | ICD-10-CM | POA: Diagnosis present

## 2023-03-17 DIAGNOSIS — F1590 Other stimulant use, unspecified, uncomplicated: Secondary | ICD-10-CM | POA: Diagnosis present

## 2023-03-17 DIAGNOSIS — N400 Enlarged prostate without lower urinary tract symptoms: Secondary | ICD-10-CM | POA: Diagnosis present

## 2023-03-17 DIAGNOSIS — F1910 Other psychoactive substance abuse, uncomplicated: Secondary | ICD-10-CM | POA: Diagnosis present

## 2023-03-17 DIAGNOSIS — R079 Chest pain, unspecified: Secondary | ICD-10-CM | POA: Diagnosis present

## 2023-03-17 DIAGNOSIS — Z79899 Other long term (current) drug therapy: Secondary | ICD-10-CM | POA: Diagnosis not present

## 2023-03-17 DIAGNOSIS — F1423 Cocaine dependence with withdrawal: Secondary | ICD-10-CM | POA: Diagnosis present

## 2023-03-17 DIAGNOSIS — R569 Unspecified convulsions: Secondary | ICD-10-CM | POA: Diagnosis present

## 2023-03-17 DIAGNOSIS — I4891 Unspecified atrial fibrillation: Secondary | ICD-10-CM | POA: Diagnosis present

## 2023-03-17 DIAGNOSIS — F4310 Post-traumatic stress disorder, unspecified: Secondary | ICD-10-CM | POA: Diagnosis present

## 2023-03-17 DIAGNOSIS — E876 Hypokalemia: Secondary | ICD-10-CM | POA: Diagnosis present

## 2023-03-17 DIAGNOSIS — F1420 Cocaine dependence, uncomplicated: Secondary | ICD-10-CM | POA: Diagnosis present

## 2023-03-17 DIAGNOSIS — Z59819 Housing instability, housed unspecified: Secondary | ICD-10-CM | POA: Diagnosis not present

## 2023-03-17 DIAGNOSIS — F10239 Alcohol dependence with withdrawal, unspecified: Secondary | ICD-10-CM | POA: Diagnosis present

## 2023-03-17 DIAGNOSIS — Z9152 Personal history of nonsuicidal self-harm: Secondary | ICD-10-CM

## 2023-03-17 DIAGNOSIS — F1914 Other psychoactive substance abuse with psychoactive substance-induced mood disorder: Secondary | ICD-10-CM | POA: Diagnosis present

## 2023-03-17 DIAGNOSIS — R45851 Suicidal ideations: Secondary | ICD-10-CM | POA: Diagnosis present

## 2023-03-17 DIAGNOSIS — F333 Major depressive disorder, recurrent, severe with psychotic symptoms: Secondary | ICD-10-CM | POA: Diagnosis present

## 2023-03-17 DIAGNOSIS — F1020 Alcohol dependence, uncomplicated: Secondary | ICD-10-CM | POA: Diagnosis present

## 2023-03-17 DIAGNOSIS — I10 Essential (primary) hypertension: Secondary | ICD-10-CM | POA: Diagnosis present

## 2023-03-17 DIAGNOSIS — J45909 Unspecified asthma, uncomplicated: Secondary | ICD-10-CM | POA: Diagnosis present

## 2023-03-17 HISTORY — DX: Unspecified convulsions: R56.9

## 2023-03-17 LAB — COMPREHENSIVE METABOLIC PANEL
ALT: 9 U/L (ref 0–44)
AST: 15 U/L (ref 15–41)
Albumin: 3.1 g/dL — ABNORMAL LOW (ref 3.5–5.0)
Alkaline Phosphatase: 108 U/L (ref 38–126)
Anion gap: 6 (ref 5–15)
BUN: 7 mg/dL (ref 6–20)
CO2: 26 mmol/L (ref 22–32)
Calcium: 8.7 mg/dL — ABNORMAL LOW (ref 8.9–10.3)
Chloride: 107 mmol/L (ref 98–111)
Creatinine, Ser: 0.84 mg/dL (ref 0.61–1.24)
GFR, Estimated: >60 mL/min (ref >60)
Glucose, Bld: 102 mg/dL — ABNORMAL HIGH (ref 70–99)
Potassium: 3.5 mmol/L (ref 3.5–5.1)
Sodium: 139 mmol/L (ref 135–145)
Total Bilirubin: 0.7 mg/dL (ref 0.3–1.2)
Total Protein: 6.1 g/dL — ABNORMAL LOW (ref 6.5–8.1)

## 2023-03-17 MED ORDER — ACETAMINOPHEN 325 MG PO TABS
650.0000 mg | ORAL_TABLET | Freq: Four times a day (QID) | ORAL | Status: DC | PRN
  Administered 2023-03-18 – 2023-03-24 (×9): 650 mg via ORAL
  Filled 2023-03-17 (×10): qty 2

## 2023-03-17 MED ORDER — DIPHENHYDRAMINE HCL 25 MG PO CAPS: 50.0000 mg | ORAL_CAPSULE | Freq: Three times a day (TID) | ORAL | Status: DC | PRN

## 2023-03-17 MED ORDER — LORAZEPAM 1 MG PO TABS: 1.0000 mg | ORAL_TABLET | ORAL | Status: DC | PRN

## 2023-03-17 MED ORDER — MAGNESIUM HYDROXIDE 400 MG/5ML PO SUSP: 30.0000 mL | Freq: Every day | ORAL | Status: DC | PRN

## 2023-03-17 MED ORDER — HALOPERIDOL LACTATE 5 MG/ML IJ SOLN: 5.0000 mg | Freq: Three times a day (TID) | INTRAMUSCULAR | Status: DC | PRN

## 2023-03-17 MED ORDER — CARIPRAZINE HCL 1.5 MG PO CAPS
1.5000 mg | ORAL_CAPSULE | Freq: Every day | ORAL | Status: DC
  Administered 2023-03-18: 1.5 mg via ORAL
  Filled 2023-03-17 (×3): qty 1

## 2023-03-17 MED ORDER — LORAZEPAM 1 MG PO TABS: 2.0000 mg | ORAL_TABLET | Freq: Three times a day (TID) | ORAL | Status: DC | PRN

## 2023-03-17 MED ORDER — TRAZODONE HCL 50 MG PO TABS
50.0000 mg | ORAL_TABLET | Freq: Every evening | ORAL | Status: DC | PRN
  Filled 2023-03-17 (×4): qty 1

## 2023-03-17 MED ORDER — ADULT MULTIVITAMIN W/MINERALS CH
1.0000 | ORAL_TABLET | Freq: Every day | ORAL | Status: DC
  Administered 2023-03-17: 1 via ORAL
  Filled 2023-03-17: qty 1

## 2023-03-17 MED ORDER — HALOPERIDOL 5 MG PO TABS: 5.0000 mg | ORAL_TABLET | Freq: Three times a day (TID) | ORAL | Status: DC | PRN

## 2023-03-17 MED ORDER — ALUM & MAG HYDROXIDE-SIMETH 200-200-20 MG/5ML PO SUSP: 30.0000 mL | ORAL | Status: DC | PRN

## 2023-03-17 MED ORDER — LORAZEPAM 2 MG/ML IJ SOLN: 2.0000 mg | Freq: Three times a day (TID) | INTRAMUSCULAR | Status: DC | PRN

## 2023-03-17 MED ORDER — THIAMINE MONONITRATE 100 MG PO TABS
100.0000 mg | ORAL_TABLET | Freq: Every day | ORAL | Status: DC
  Administered 2023-03-17: 100 mg via ORAL
  Filled 2023-03-17: qty 1

## 2023-03-17 MED ORDER — DIPHENHYDRAMINE HCL 50 MG/ML IJ SOLN: 50.0000 mg | Freq: Three times a day (TID) | INTRAMUSCULAR | Status: DC | PRN

## 2023-03-17 MED ORDER — THIAMINE HCL 100 MG/ML IJ SOLN: 100.0000 mg | Freq: Every day | INTRAMUSCULAR | Status: DC

## 2023-03-17 MED ORDER — HYDROXYZINE HCL 25 MG PO TABS
25.0000 mg | ORAL_TABLET | Freq: Three times a day (TID) | ORAL | Status: DC | PRN
  Administered 2023-03-18 – 2023-03-22 (×6): 25 mg via ORAL
  Filled 2023-03-17 (×7): qty 1

## 2023-03-17 MED ORDER — PNEUMOCOCCAL VAC POLYVALENT 25 MCG/0.5ML IJ INJ
0.5000 mL | INJECTION | INTRAMUSCULAR | Status: DC
Start: 2023-03-18 — End: 2023-03-17

## 2023-03-17 MED ORDER — FOLIC ACID 1 MG PO TABS
1.0000 mg | ORAL_TABLET | Freq: Every day | ORAL | Status: DC
  Administered 2023-03-17: 1 mg via ORAL
  Filled 2023-03-17: qty 1

## 2023-03-17 NOTE — Progress Notes (Signed)
Pt meets inpatient BH criteria per Sindy Guadeloupe, NP. Pt was received to be reviewed on 03/16/23 at 10:06pm by Night [facility] Fransico Michael, RN. 1st shift Disposition CSW to follow up.   Maryjean Ka, MSW, LCSWA 03/17/2023 1:18 AM

## 2023-03-17 NOTE — ED Notes (Signed)
3 copies of completed IVC papers on the chart in purple and original copy placed in red folder by Ms.Dee-Monique,RN

## 2023-03-17 NOTE — ED Provider Notes (Signed)
Emergency Medicine Observation Re-evaluation Note  [name] is a 52 y.o. male, seen on rounds today.  Pt initially presented to the ED for complaints of Psychiatric Evaluation Currently, the patient is asleep.  Physical Exam  BP (!) 147/88   Pulse 71   Temp 98.6 F (37 C)   Resp 16   SpO2 98%  Physical Exam General: Asleep in bed, no acute distress Cardiac: Regular rate Lungs: No increased work of breathing Psych: Calm, sleep  ED Course / MDM  EKG:EKG Interpretation Date/Time:  Monday March 16 2023 16:41:07 EDT Ventricular Rate:  63 PR Interval:  174 QRS Duration:  96 QT Interval:  422 QTC Calculation: 431 R Axis:   10  Text Interpretation: Normal sinus rhythm Septal infarct , age undetermined Abnormal ECG When compared with ECG of 15-Mar-2023 21:47, PREVIOUS ECG IS PRESENT Confirmed by Vonita Moss [phone]) on 03/16/2023 5:24:47 PM  I have reviewed the labs performed to date as well as medications administered while in observation.  Recent changes in the last 24 hours include patient is medically cleared and is recommended for inpatient psych, pending acceptance to a facility.  Plan  Current plan is for inpatient psych.    Rexford Maus, DO 03/17/23 [phone]

## 2023-03-17 NOTE — Progress Notes (Addendum)
Pt admitted IVC to [facility] inpatient adult unit.  Pt reported he began using cocaine and the use was increasing rapidly.  He was selling belongings for drugs.  Pt stated he knew he needed help.  Pt stated he began to have chest pain and called 911 for assistance.  Once at the hospital pt stated he had suicidal ideation without a plan.  Pt stated he has attempted approximately 13 times by cutting.  Pt continues to endorse suicidal ideation during admission.  Contract for safety.  Denies HI.  Pt endorses audio hallucinations (hearing voices not command in nature and visual hallucination during drinking spells). Pt denies legal issues.  Unemployed on disability.  Pt oriented to unit.  Pt safe on unit.

## 2023-03-17 NOTE — ED Notes (Signed)
Pt ambulated out w/ LEO w/ steady gait. VSS and A&Ox4 upon departure. Belongings and paperwork given to [facility].

## 2023-03-17 NOTE — Consult Note (Signed)
  Pt originally seen by TTS last night and recommended for inpatient treatment. Pt has been accepted to [name], pending CMP redraw to assess potassium level. Pt given K+ supplement, original level resulted low at 3.0 yesterday evening.   CMP redraw currently ordered and pending.

## 2023-03-17 NOTE — ED Notes (Signed)
Belongings located in locker 6 and to be given to [employer].

## 2023-03-17 NOTE — Progress Notes (Signed)
Substance abuse resources have been added to patients AVS. Patient is currently under evaluation with psychiatry for inpatient placement.

## 2023-03-18 ENCOUNTER — Encounter (HOSPITAL_COMMUNITY): Payer: Self-pay

## 2023-03-18 DIAGNOSIS — F333 Major depressive disorder, recurrent, severe with psychotic symptoms: Principal | ICD-10-CM

## 2023-03-18 DIAGNOSIS — F411 Generalized anxiety disorder: Secondary | ICD-10-CM | POA: Diagnosis present

## 2023-03-18 MED ORDER — PAROXETINE HCL 20 MG PO TABS
20.0000 mg | ORAL_TABLET | Freq: Every day | ORAL | Status: DC
  Administered 2023-03-18 – 2023-03-20 (×3): 20 mg via ORAL
  Filled 2023-03-18 (×4): qty 1

## 2023-03-18 MED ORDER — ONDANSETRON 4 MG PO TBDP: 4.0000 mg | ORAL_TABLET | Freq: Four times a day (QID) | ORAL | Status: AC | PRN

## 2023-03-18 MED ORDER — LORAZEPAM 1 MG PO TABS: 1.0000 mg | ORAL_TABLET | Freq: Four times a day (QID) | ORAL | Status: AC | PRN

## 2023-03-18 MED ORDER — LOPERAMIDE HCL 2 MG PO CAPS: 2.0000 mg | ORAL_CAPSULE | ORAL | Status: AC | PRN

## 2023-03-18 MED ORDER — ADULT MULTIVITAMIN W/MINERALS CH
1.0000 | ORAL_TABLET | Freq: Every day | ORAL | Status: DC
  Administered 2023-03-18 – 2023-03-24 (×7): 1 via ORAL
  Filled 2023-03-18 (×9): qty 1

## 2023-03-18 MED ORDER — AMLODIPINE BESYLATE 5 MG PO TABS
5.0000 mg | ORAL_TABLET | Freq: Every day | ORAL | Status: DC
  Administered 2023-03-18 – 2023-03-19 (×2): 5 mg via ORAL
  Filled 2023-03-18 (×5): qty 1

## 2023-03-18 MED ORDER — LORAZEPAM 2 MG/ML IJ SOLN: 2.0000 mg | Freq: Two times a day (BID) | INTRAMUSCULAR | Status: DC | PRN

## 2023-03-18 MED ORDER — VITAMIN B-1 100 MG PO TABS
100.0000 mg | ORAL_TABLET | Freq: Every day | ORAL | Status: DC
  Administered 2023-03-19 – 2023-03-24 (×6): 100 mg via ORAL
  Filled 2023-03-18 (×8): qty 1

## 2023-03-18 MED ORDER — CLONIDINE HCL 0.1 MG PO TABS
0.1000 mg | ORAL_TABLET | ORAL | Status: DC | PRN
  Administered 2023-03-19: 0.1 mg via ORAL
  Filled 2023-03-18: qty 1
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
## 2023-03-18 NOTE — Plan of Care (Signed)
  Problem: Education: Goal: Emotional status will improve Outcome: Progressing   Problem: Activity: Goal: Interest or engagement in activities will improve Outcome: Progressing   Problem: Safety: Goal: Periods of time without injury will increase Outcome: Progressing

## 2023-03-18 NOTE — [facility] Group Notes (Signed)
Adult Psychoeducational Group Note  Date:  03/18/2023 Time:  10:39 AM  Group Topic/Focus:  Emotional Education:   The focus of this group is to discuss what feelings/emotions are, and how they are experienced. Goals Group:   The focus of this group is to help patients establish daily goals to achieve during treatment and discuss how the patient can incorporate goal setting into their daily lives to aide in recovery.  Participation Level:  Minimal  Participation Quality:  Attentive  Affect:  Appropriate  Cognitive:  Alert  Insight: Appropriate  Engagement in Group:  Engaged  Modes of Intervention:  Education and Exploration  Additional Comments:  Pt participated in group today. Pt stated his goal is to talk with provider and develop future treatment plan. Facilitator educated the group on the Four Cues emotional, behavioral, physical and cognitive engaging the group in identifying their cues. Facilitator read a spiritual quote for today.     [name] 03/18/2023, 10:39 AM

## 2023-03-18 NOTE — Plan of Care (Signed)
°  Problem: Education: °Goal: Emotional status will improve °Outcome: Progressing °Goal: Mental status will improve °Outcome: Progressing °  °Problem: Activity: °Goal: Interest or engagement in activities will improve °Outcome: Progressing °  °

## 2023-03-18 NOTE — Progress Notes (Signed)
   03/18/23 2015  Psych Admission Type (Psych Patients Only)  Admission Status Involuntary  Psychosocial Assessment  Patient Complaints Anxiety;Depression  Eye Contact Fair  Facial Expression Sad  Affect Anxious  Speech Logical/coherent  Interaction Assertive  Motor Activity Slow  Appearance/Hygiene Unremarkable  Behavior Characteristics Cooperative;Anxious  Mood Anxious;Depressed  Aggressive Behavior  Effect No apparent injury  Thought Process  Coherency Circumstantial  Content Blaming others  Delusions None reported or observed  Perception WDL  Hallucination None reported or observed  Judgment WDL  Confusion WDL  Danger to Self  Current suicidal ideation? Denies  Danger to Others  Danger to Others None reported or observed

## 2023-03-18 NOTE — Group Note (Signed)
Recreation Therapy Group Note   Group Topic:Team Building  Group Date: 03/18/2023 Start Time: 0935 End Time: 1000 Facilitators: Jyden Kromer-McCall, LRT,CTRS Location: [address] Dayroom   Goal Area(s) Addresses:  Patient will effectively work with peer towards shared goal.  Patient will identify skills used to make activity successful.  Patient will share challenges and verbalize solution-driven approaches used. Patient will identify how skills used during activity can be used to reach post d/c goals.    Group Description: [employer]. Patients were provided the following materials: 4 drinking straws, 5 rubber bands, 5 paper clips, 2 index cards and 2 drinking cups. Using the provided materials patients were asked to build a launching mechanism to launch a ping pong ball across the room, approximately 10 feet. Patients were divided into teams of 3-5. Instructions required all materials be incorporated into the device, functionality of items left to the peer group's discretion.   Affect/Mood: N/A   Participation Level: Did not attend    Clinical Observations/Individualized Feedback:     Plan: Continue to engage patient in RT group sessions 2-3x/week.   Caiden Arteaga-McCall, LRT,CTRS  03/18/2023 12:23 PM

## 2023-03-18 NOTE — Progress Notes (Signed)
D- Patient alert and oriented x4. Patient in stable mood.  Denies HI, but verbalizes passive SI and AVH.  Pt interacting positively with staff and peers. Pt states he does not take certain medications because they cause hallucinations and refused medicine for anxiety.   A- Routine safety checks conducted every 15 minutes.  Patient informed to notify staff with problems or concerns.   R- Patient compliant and verbalized understanding treatment plan. Patient receptive, calm, and cooperative.      03/17/23 2030  Psych Admission Type (Psych Patients Only)  Admission Status Involuntary  Psychosocial Assessment  Patient Complaints Anxiety;Substance abuse;Self-harm thoughts  Eye Contact Fair  Facial Expression Anxious;Animated  Affect Anxious;Preoccupied  Speech Logical/coherent  Interaction Assertive  Motor Activity Slow  Appearance/Hygiene In scrubs  Behavior Characteristics Cooperative;Anxious  Mood Anxious;Preoccupied  Thought Process  Coherency WDL  Content Blaming self  Delusions None reported or observed  Perception Hallucinations  Hallucination Auditory;Visual  Judgment Poor  Confusion None  Danger to Self  Current suicidal ideation? Passive  Self-Injurious Behavior No self-injurious ideation or behavior indicators observed or expressed   Agreement Not to Harm Self Yes  Description of Agreement Verbal Contract  Danger to Others  Danger to Others None reported or observed

## 2023-03-18 NOTE — Progress Notes (Signed)
Pt denied SI/HI/AVH this morning. Pt has been pleasant, calm, and cooperative throughout the shift. RN provided support and encouragement to patient. Pt complains of 8/10 pain in his right thigh, PRN Tylenol administered for pain. Pt given scheduled medications as prescribed. Q15 min checks verified for safety. Patient verbally contracts for safety. Patient compliant with medications and treatment plan. Patient is interacting well on the unit. Pt is safe on the unit.   03/18/23 0900  Psych Admission Type (Psych Patients Only)  Admission Status Involuntary  Psychosocial Assessment  Patient Complaints Anxiety;Depression  Eye Contact Fair  Facial Expression Anxious;Sad  Affect Anxious;Sad  Speech Press photographer;Slow  Appearance/Hygiene Unremarkable  Behavior Characteristics Cooperative;Anxious  Mood Anxious;Preoccupied  Thought Process  Coherency WDL  Content Blaming others  Delusions None reported or observed  Perception WDL  Hallucination None reported or observed  Judgment Impaired  Confusion None  Danger to Self  Current suicidal ideation? Denies  Self-Injurious Behavior No self-injurious ideation or behavior indicators observed or expressed   Agreement Not to Harm Self Yes  Description of Agreement Verbal  Danger to Others  Danger to Others None reported or observed
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
## 2023-03-18 NOTE — [facility] Group Notes (Signed)
Spiritual care group facilitated by Chaplain Dyanne Carrel, [facility] and Chaplain Arlyce Dice  Group focused on topic of strength. Group members reflected on what thoughts and feelings emerge when they hear this topic. They then engaged in facilitated dialog around how strength is present in their lives. This dialog focused on representing what strength had been to them in their lives (images and patterns given) and what they saw as helpful in their life now (what they needed / wanted).  Activity drew on narrative framework.  Patient Progress: Did not attend.

## 2023-03-18 NOTE — BH IP Treatment Plan (Signed)
Interdisciplinary Treatment and Diagnostic Plan Update  03/18/2023 Time of Session: 10:42am [name] MRN: [id number]  Principal Diagnosis: MDD (major depressive disorder), recurrent, severe, with psychosis (HCC)  Secondary Diagnoses: Principal Problem:   MDD (major depressive disorder), recurrent, severe, with psychosis (HCC)   Current Medications:  Current Facility-Administered Medications  Medication Dose Route Frequency Provider Last Rate Last Admin   acetaminophen (TYLENOL) tablet 650 mg  650 mg Oral Q6H PRN Onuoha, Chinwendu V, NP   650 mg at 03/18/23 0918   alum & mag hydroxide-simeth (MAALOX/MYLANTA) 200-200-20 MG/5ML suspension 30 mL  30 mL Oral Q4H PRN Onuoha, Chinwendu V, NP       amLODipine (NORVASC) tablet 5 mg  5 mg Oral Daily Massengill, Nathan, MD   5 mg at 03/18/23 4098   cloNIDine (CATAPRES) tablet 0.1 mg  0.1 mg Oral Q4H PRN Massengill, Harrold Donath, MD       diphenhydrAMINE (BENADRYL) capsule 50 mg  50 mg Oral TID PRN Onuoha, Chinwendu V, NP       Or   diphenhydrAMINE (BENADRYL) injection 50 mg  50 mg Intramuscular TID PRN Onuoha, Chinwendu V, NP       haloperidol (HALDOL) tablet 5 mg  5 mg Oral TID PRN Onuoha, Chinwendu V, NP       Or   haloperidol lactate (HALDOL) injection 5 mg  5 mg Intramuscular TID PRN Onuoha, Chinwendu V, NP       hydrOXYzine (ATARAX) tablet 25 mg  25 mg Oral TID PRN Onuoha, Chinwendu V, NP       loperamide (IMODIUM) capsule 2-4 mg  2-4 mg Oral PRN Massengill, Harrold Donath, MD       LORazepam (ATIVAN) tablet 2 mg  2 mg Oral TID PRN Onuoha, Chinwendu V, NP       Or   LORazepam (ATIVAN) injection 2 mg  2 mg Intramuscular TID PRN Onuoha, Chinwendu V, NP       LORazepam (ATIVAN) tablet 1 mg  1 mg Oral Q6H PRN Massengill, Nathan, MD       magnesium hydroxide (MILK OF MAGNESIA) suspension 30 mL  30 mL Oral Daily PRN Onuoha, Chinwendu V, NP       multivitamin with minerals tablet 1 tablet  1 tablet Oral Daily Massengill, Nathan, MD   1 tablet at 03/18/23  1210   ondansetron (ZOFRAN-ODT) disintegrating tablet 4 mg  4 mg Oral Q6H PRN Massengill, Harrold Donath, MD       PARoxetine (PAXIL) tablet 20 mg  20 mg Oral Daily [name], MD   20 mg at 03/18/23 1210   [START ON 03/19/2023] thiamine (Vitamin B-1) tablet 100 mg  100 mg Oral Daily Massengill, Nathan, MD       traZODone (DESYREL) tablet 50 mg  50 mg Oral QHS PRN Onuoha, Chinwendu V, NP       PTA Medications: Medications Prior to Admission  Medication Sig Dispense Refill Last Dose   amLODipine (NORVASC) 5 MG tablet Take 1 tablet (5 mg total) by mouth daily. 30 tablet 1    cariprazine (VRAYLAR) 1.5 MG capsule Take 1 capsule (1.5 mg total) by mouth daily. (Patient not taking: Reported on 03/04/2023) 10 capsule 0    cariprazine (VRAYLAR) 1.5 MG capsule Take 1 capsule (1.5 mg total) by mouth daily. (Patient not taking: Reported on 03/16/2023) 30 capsule 2    chlordiazePOXIDE (LIBRIUM) 25 MG capsule 50mg  PO TID x 1D, then 25-50mg  PO BID X 1D, then 25-50mg  PO QD X 1D (Patient  taking differently: Take 25 mg by mouth daily. 50mg  PO TID x 1D, then 25-50mg  PO BID X 1D, then 25-50mg  PO QD X 1D) 10 capsule 0     Patient Stressors:    Patient Strengths:    Treatment Modalities: Medication Management, Group therapy, Case management,  1 to 1 session with clinician, Psychoeducation, Recreational therapy.   Physician Treatment Plan for Primary Diagnosis: MDD (major depressive disorder), recurrent, severe, with psychosis (HCC) Long Term Goal(s): Improvement in symptoms so as ready for discharge   Short Term Goals: Ability to identify changes in lifestyle to reduce recurrence of condition will improve Ability to verbalize feelings will improve Ability to disclose and discuss suicidal ideas Ability to demonstrate self-control will improve Compliance with prescribed medications will improve Ability to identify triggers associated with substance abuse/mental health issues will improve Ability to  identify and develop effective coping behaviors will improve  Medication Management: Evaluate patient's response, side effects, and tolerance of medication regimen.  Therapeutic Interventions: 1 to 1 sessions, Unit Group sessions and Medication administration.  Evaluation of Outcomes: Not Progressing  Physician Treatment Plan for Secondary Diagnosis: Principal Problem:   MDD (major depressive disorder), recurrent, severe, with psychosis (HCC)  Long Term Goal(s): Improvement in symptoms so as ready for discharge   Short Term Goals: Ability to identify changes in lifestyle to reduce recurrence of condition will improve Ability to verbalize feelings will improve Ability to disclose and discuss suicidal ideas Ability to demonstrate self-control will improve Compliance with prescribed medications will improve Ability to identify triggers associated with substance abuse/mental health issues will improve Ability to identify and develop effective coping behaviors will improve     Medication Management: Evaluate patient's response, side effects, and tolerance of medication regimen.  Therapeutic Interventions: 1 to 1 sessions, Unit Group sessions and Medication administration.  Evaluation of Outcomes: Not Progressing   RN Treatment Plan for Primary Diagnosis: MDD (major depressive disorder), recurrent, severe, with psychosis (HCC) Long Term Goal(s): Knowledge of disease and therapeutic regimen to maintain health will improve  Short Term Goals: Ability to remain free from injury will improve, Ability to verbalize frustration and anger appropriately will improve, Ability to demonstrate self-control, Ability to participate in decision making will improve, Ability to verbalize feelings will improve, Ability to disclose and discuss suicidal ideas, Ability to identify and develop effective coping behaviors will improve, and Compliance with prescribed medications will improve  Medication Management:  RN will administer medications as ordered by provider, will assess and evaluate patient's response and provide education to patient for prescribed medication. RN will report any adverse and/or side effects to prescribing provider.  Therapeutic Interventions: 1 on 1 counseling sessions, Psychoeducation, Medication administration, Evaluate responses to treatment, Monitor vital signs and CBGs as ordered, Perform/monitor CIWA, COWS, AIMS and Fall Risk screenings as ordered, Perform wound care treatments as ordered.  Evaluation of Outcomes: Not Progressing   LCSW Treatment Plan for Primary Diagnosis: MDD (major depressive disorder), recurrent, severe, with psychosis (HCC) Long Term Goal(s): Safe transition to appropriate next level of care at discharge, Engage patient in therapeutic group addressing interpersonal concerns.  Short Term Goals: Engage patient in aftercare planning with referrals and resources, Increase social support, Increase ability to appropriately verbalize feelings, Increase emotional regulation, and Increase skills for wellness and recovery  Therapeutic Interventions: Assess for all discharge needs, 1 to 1 time with Social worker, Explore available resources and support systems, Assess for adequacy in community support network, Educate family and significant other(s) on suicide prevention, Complete  Psychosocial Assessment, Interpersonal group therapy.  Evaluation of Outcomes: Not Progressing   Progress in Treatment: Attending groups: Yes. Participating in groups: Yes. Taking medication as prescribed: Yes. Toleration medication: Yes. Family/Significant other contact made: No, will contact:  Donald Pore, [phone] Patient understands diagnosis: Yes. Discussing patient identified problems/goals with staff: Yes. Medical problems stabilized or resolved: Yes. Denies suicidal/homicidal ideation: Yes. Issues/concerns per patient self-inventory: No. Other: n/a  New  problem(s) identified: No, Describe:  patient did not identify any new problems.   New Short Term/Long Term Goal(s): detox, medication management for mood stabilization; elimination of SI thoughts; development of comprehensive mental wellness/sobriety plan  OR   medication stabilization, elimination of SI thoughts, development of comprehensive mental wellness plan.    Patient Goals:  "I want to get stabilized treatment to get into a long term treatment program".   Discharge Plan or Barriers: Patient recently admitted. CSW will continue to follow and assess for appropriate referrals and possible discharge planning.    Reason for Continuation of Hospitalization: Depression  Estimated Length of Stay: 3 to 5 days   Last 3 [place] Suicide Severity Risk Score: Flowsheet Row Ad[facility] (Current) [facility] from 03[facility]16/2023 in [facility] ED from 03/15/2023 in [facility] Department [facility]  C-SSRS RISK CATEGORY Low Risk Moderate Risk No Risk       Last PHQ 2/9 Scores:    09/22/2021   12:30 PM 06/25/2021    2:51 PM 09/19/2020    5:54 PM  Depression screen PHQ 2/9  Decreased Interest 1 1 3   Down, Depressed, Hopeless 1 1 3   PHQ - 2 Score 2 2 6   Altered sleeping 2 3 3   Tired, decreased energy 1 1 3   Change in appetite 0 2 3  Feeling bad or failure about yourself   1 3  Trouble concentrating 1 1 3   Moving slowly or fidgety/restless 0 1 3  Suicidal thoughts 0 0 3  PHQ-9 Score 6 11 27   Difficult doing work/chores Somewhat difficult Somewhat difficult     Scribe for Treatment Team: Veva Holes, Theresia Majors 03/18/2023 2:05 PM

## 2023-03-19 MED ORDER — ASPIRIN-ACETAMINOPHEN-CAFFEINE 250-250-65 MG PO TABS: 1.0000 | ORAL_TABLET | Freq: Three times a day (TID) | ORAL | Status: DC | PRN

## 2023-03-19 MED ORDER — AMLODIPINE BESYLATE 10 MG PO TABS
10.0000 mg | ORAL_TABLET | Freq: Every day | ORAL | Status: DC
  Administered 2023-03-20 – 2023-03-24 (×5): 10 mg via ORAL
  Filled 2023-03-19 (×7): qty 1

## 2023-03-19 MED ORDER — PRAZOSIN HCL 1 MG PO CAPS
1.0000 mg | ORAL_CAPSULE | Freq: Every day | ORAL | Status: DC
  Administered 2023-03-19 – 2023-03-23 (×5): 1 mg via ORAL
  Filled 2023-03-19 (×8): qty 1

## 2023-03-19 NOTE — Progress Notes (Addendum)
[facility] MD Progress Note  03/19/2023 12:14 PM [name]  MRN:  [id number] Principal Problem: MDD (major depressive disorder), recurrent, severe, with psychosis (HCC) Diagnosis: Principal Problem:   MDD (major depressive disorder), recurrent, severe, with psychosis (HCC) Active Problems:   GAD (generalized anxiety disorder)   Alcohol dependence with alcohol-induced mood disorder (HCC)   Stimulant use disorder   PTSD (post-traumatic stress disorder)  Subjective:   [name] is a 47 y M with past psychiatric history of MDD, PTSD, polysubstance use and past medical history of obstructive sleep apnea and repeated episodes of chest pain who presented to the ED under IVC in the setting of auditory and visual hallucinations, increased substance use, and suicidal ideations. Patient medically cleared and admitted to [facility].   Case was discussed in the multidisciplinary team. MAR was reviewed and patient fully compliant with medications.   PRNs from last 24 hours: 7/17: Tylenol 650 mg 2725 7/17: Hydroxyzine 25 mg 2124 7/18: Hydroxyzine 25 mg 0819  Psychiatric Team made the following recommendations yesterday:  -- Start Ativan taper per protocol for alcohol withdrawal                         -- 1 mg Q6 for the first day (7/17)                         -- 1 mg Q8 for 2nd day (7/18)                         -- 1 mg Q12 for 3rd day (7/19)                         -- 1 mg Q24 for 4th day (7/20)                                     -- 2 mg injection PRN as needed for seizures             -- Start Paroxetine 20 mg daily for MDD             -- Start Trazodone 50 mg at bedtime prn for Insomnia, MDD             -- Start Hydroxyzine 25 mg TID PRN for Anxiety  Today on interview, met patient in his room this morning. He followed to the large group room. He mentioned that he slept poorly overnight due to nightmares and anxiety about falling back asleep. His nightmare involved some of the violent trauma that  he experienced in his distant past. Patient was tearful this morning when he discussed what his past year has been like. He has three assignments today: one, identify a list of his personal values. Two, identify some of his personal strengths. Three, identify some activities that he could get involved with and how they would translate his values and strengths into practice.  Sleep: Poor Appetite:  Good Depression: High Anxiety: High Auditory Hallucinations: Denies Visual Hallucinations: Denies Paranoia: Denies HI: Denies SI: Says that he has thought about but does not own guns. "I'm just tired of all this. This life." Side effects from medications: does not endorse any side-effects they attribute to medications. Other concerns discussed with patient:  Total time spent with patient: 30 minutes  Past psychiatric history:  Inpatient hospitalization for self-harm in [place] in remote past, 3 inpatient rehab stays. History significant for: MDD, PTSD, polysubstance use. Rehab and detoxification several times in the past, most recent in 2022. Complete sobriety in 2013-2016.   Past Medical History:  Past Medical History:  Diagnosis Date   Alcohol dependence with withdrawal (HCC)    Anxiety    Arrhythmia    Asthma    Atrial fibrillation (HCC)    not on AC   Bipolar 1 disorder (HCC)    Class 2 obesity    Cocaine abuse (HCC)    Depression    Heart palpitations    with severe anxiety   Hyperlipidemia    Hypertension    PTSD (post-traumatic stress disorder)    PTSD (post-traumatic stress disorder)    Seizure (HCC)    Syncope and collapse     Past Surgical History:  Procedure Laterality Date   NO PAST SURGERIES     Family History:  Family History  Problem Relation Age of Onset   Alcohol abuse Mother    Heart disease Mother    Hypertension Father    Alcohol abuse Maternal Grandfather    Family Psychiatric History:  two deaths by suicide on maternal side   Social History:   Social History   Substance and Sexual Activity  Alcohol Use Yes     Social History   Substance and Sexual Activity  Drug Use Yes   Types: Cocaine    Social History   Socioeconomic History   Marital status: Single    Spouse name: Not on file   Number of children: Not on file   Years of education: Not on file   Highest education level: Not on file  Occupational History   Occupation: disabled  Tobacco Use   Smoking status: Never    Passive exposure: Current (Indoor smoking)   Smokeless tobacco: Never  Vaping Use   Vaping status: Never Used  Substance and Sexual Activity   Alcohol use: Yes   Drug use: Yes    Types: Cocaine   Sexual activity: Not Currently    Comment: pt said he has an enlarged prostate  Other Topics Concern   Not on file  Social History Narrative   ** Merged History Encounter **       Social Determinants of Health   Financial Resource Strain: Not on file  Food Insecurity: Food Insecurity Present (03/17/2023)   Hunger Vital Sign    Worried About Running Out of Food in the Last Year: Often true    Ran Out of Food in the Last Year: Sometimes true  Transportation Needs: Unmet Transportation Needs (03/17/2023)   PRAPARE - Administrator, Civil Service (Medical): Yes    Lack of Transportation (Non-Medical): Yes  Physical Activity: Not on file  Stress: Not on file  Social Connections: Unknown (06/13/2022)   Received from [employer] [facility]   Family and [employer] Support    Help with Day-to-Day Activities: Not on file    Lonely or Isolated: Not on file   Additional Social History:   Collateral obtained from:  Current Medications: Current Facility-Administered Medications  Medication Dose Route Frequency Provider Last Rate Last Admin   acetaminophen (TYLENOL) tablet 650 mg  650 mg Oral Q6H PRN Onuoha, Chinwendu V, NP   650 mg at 03/19/23 1211   alum & mag hydroxide-simeth (MAALOX/MYLANTA) 200-200-20  MG/5ML suspension 30 mL  30 mL Oral Q4H  PRN Welford Roche, Chinwendu V, NP       amLODipine (NORVASC) tablet 5 mg  5 mg Oral Daily Massengill, Nathan, MD   5 mg at 03/19/23 0820   aspirin-acetaminophen-caffeine (EXCEDRIN MIGRAINE) per tablet 1 tablet  1 tablet Oral Q8H PRN [name], MD       cloNIDine (CATAPRES) tablet 0.1 mg  0.1 mg Oral Q4H PRN Phineas Inches, MD   0.1 mg at 03/19/23 4098   diphenhydrAMINE (BENADRYL) capsule 50 mg  50 mg Oral TID PRN Onuoha, Chinwendu V, NP       Or   diphenhydrAMINE (BENADRYL) injection 50 mg  50 mg Intramuscular TID PRN Onuoha, Chinwendu V, NP       haloperidol (HALDOL) tablet 5 mg  5 mg Oral TID PRN Onuoha, Chinwendu V, NP       Or   haloperidol lactate (HALDOL) injection 5 mg  5 mg Intramuscular TID PRN Onuoha, Chinwendu V, NP       hydrOXYzine (ATARAX) tablet 25 mg  25 mg Oral TID PRN Onuoha, Chinwendu V, NP   25 mg at 03/19/23 0819   loperamide (IMODIUM) capsule 2-4 mg  2-4 mg Oral PRN Massengill, Harrold Donath, MD       LORazepam (ATIVAN) tablet 2 mg  2 mg Oral TID PRN Onuoha, Chinwendu V, NP       Or   LORazepam (ATIVAN) injection 2 mg  2 mg Intramuscular TID PRN Onuoha, Chinwendu V, NP       LORazepam (ATIVAN) injection 2 mg  2 mg Intramuscular BID PRN Massengill, Harrold Donath, MD       LORazepam (ATIVAN) tablet 1 mg  1 mg Oral Q6H PRN Massengill, Nathan, MD       magnesium hydroxide (MILK OF MAGNESIA) suspension 30 mL  30 mL Oral Daily PRN Onuoha, Chinwendu V, NP       multivitamin with minerals tablet 1 tablet  1 tablet Oral Daily Massengill, Nathan, MD   1 tablet at 03/19/23 0819   ondansetron (ZOFRAN-ODT) disintegrating tablet 4 mg  4 mg Oral Q6H PRN Massengill, Harrold Donath, MD       PARoxetine (PAXIL) tablet 20 mg  20 mg Oral Daily [name], MD   20 mg at 03/19/23 1191   thiamine (Vitamin B-1) tablet 100 mg  100 mg Oral Daily Massengill, Harrold Donath, MD   100 mg at 03/19/23 0819   traZODone (DESYREL) tablet 50 mg  50 mg Oral  QHS PRN Onuoha, Chinwendu V, NP        Lab Results:  Results for orders placed or performed during the hospital encounter of 03/16/23 (from the past 48 hour(s))  Comprehensive metabolic panel     Status: Abnormal   Collection Time: 03/17/23  3:26 PM  Result Value Ref Range   Sodium 139 135 - 145 mmol/L   Potassium 3.5 3.5 - 5.1 mmol/L   Chloride 107 98 - 111 mmol/L   CO2 26 22 - 32 mmol/L   Glucose, Bld 102 (H) 70 - 99 mg/dL    Comment: Glucose reference range applies only to samples taken after fasting for at least 8 hours.   BUN 7 6 - 20 mg/dL   Creatinine, Ser 4.78 0.61 - 1.24 mg/dL   Calcium 8.7 (L) 8.9 - 10.3 mg/dL   Total Protein 6.1 (L) 6.5 - 8.1 g/dL   Albumin 3.1 (L) 3.5 - 5.0 g/dL   AST 15 15 - 41 U/L   ALT 9 0 -  44 U/L   Alkaline Phosphatase 108 38 - 126 U/L   Total Bilirubin 0.7 0.3 - 1.2 mg/dL   GFR, Estimated >16 >10 mL/min    Comment: (NOTE) Calculated using the CKD-EPI Creatinine Equation (2021)    Anion gap 6 5 - 15    Comment: Performed at [facility], [address], [place], [place] [address]    Blood Alcohol level:  Lab Results  Component Value Date   ETH <10 03/16/2023   ETH 92 (H) 03/04/2023    Metabolic Disorder Labs: Lab Results  Component Value Date   HGBA1C 6.0 (H) 06/20/2020   MPG 126 06/20/2020   No results found for: "PROLACTIN" Lab Results  Component Value Date   CHOL 130 06/19/2020   TRIG 67 06/19/2020   HDL 27 (L) 06/19/2020   CHOLHDL 4.8 06/19/2020   VLDL 13 06/19/2020   LDLCALC 90 06/19/2020    Physical Findings: AIMS:  , ,  ,  ,    CIWA:  CIWA-Ar Total: 1 COWS:     Musculoskeletal: Strength & Muscle Tone: within normal limits Gait & Station: normal Patient leans: N/A  Psychiatric Specialty Exam:  Presentation  General Appearance:   Appropriate for Environment; Casual   Eye Contact:  Fair  Speech:  Clear and Coherent; Normal Rate; Other (comment) (Mild stutter.)  Speech Volume:   Decreased  Handedness:  Right    Mood and Affect  Mood:  Dysphoric; Depressed  Affect:  Congruent; Appropriate; Depressed; Tearful   Thought Process  Thought Processes:  Coherent; Goal Directed; Linear  Descriptions of Associations: Intact  Orientation: Full (Time, Place and Person)  Thought Content: Abstract Reasoning; Logical; Rumination; Perseveration  History of Schizophrenia/Schizoaffective disorder: No  Duration of Psychotic Symptoms: No data recorded Hallucinations: Hallucinations: None Description of Auditory Hallucinations: hypnopompic hallucinations  Ideas of Reference: None  Suicidal Thoughts: Suicidal Thoughts: Yes, Passive SI Passive Intent and/or Plan: With Plan  Homicidal Thoughts: Homicidal Thoughts: No    Sensorium  Memory: Immediate Good; Recent Good; Remote Good   Judgment:  Fair   Insight:  Good    Executive Functions  Concentration:  Good  Attention Span:  Good  Recall:  Good  Fund of Knowledge:  Good  Language:  Good  Psychomotor Activity  Psychomotor Activity:  Psychomotor Activity: Normal; Psychomotor Retardation   Physical Exam: Vitals and nursing note reviewed.  Constitutional:      Appearance: He is obese.  HENT:     Head: Normocephalic and atraumatic.  Pulmonary:     Effort: Pulmonary effort is normal.  Skin:    General: Skin is warm and dry.     Comments: Pt has extensive scarring across both arms indicative of prolonged pattern of self-harm via cutting. Pt also has numerous circular wounds in various stages of healing. Pt reports additional scarring on legs from similar.  Neurological:     General: No focal deficit present.     Mental Status: He is alert and oriented to person, place, and time.      Review of Systems: Respiratory:  Negative for cough.   Cardiovascular:  Positive for palpitations. Negative for chest pain.  Musculoskeletal:  Positive for neck pain.  Psychiatric/Behavioral:   Positive for depression, hallucinations, substance abuse and suicidal ideas. The patient is nervous/anxious.    Blood pressure (!) 127/90, pulse 85, temperature 98.5 F (36.9 C), temperature source Oral, resp. rate 18, height 5\' 6"  (1.676 m), weight 112.9 kg, SpO2 96%. Body mass index is  40.19 kg/m.  ASSESSMENT:  [name] is a 64 y M with past psychiatric history of MDD, PTSD, polysubstance use and past medical history of obstructive sleep apnea and repeated episodes of chest pain who presented to the ED under IVC in the setting of auditory and visual hallucinations, increased substance use, and suicidal ideations. Patient medically cleared and admitted to [facility].   7/18: This morning on exam, [name] was alternating between melancholic and anxious. He reported very poor sleep overnight, which is consistent with staff reports. He reported nightmares that are vivid, violent, and disturbing. He becomes afraid to fall back asleep. He was groggy this morning. Pt is not reporting any withdrawal symptoms and is pleased at the way the ativan taper is working. Pt Confirmed that he has not had any worrying signs of alcohol withdrawal, which was a major concern for his safety.   Continue the antidepressant, add on prazosin to help him with his nightmares that are intrusive and distressing. Beginning to look towards discharge, awaiting placement from [facility], or [facility].   Diagnoses / Active Problems:             -- Alcohol Use Disorder, Chronic             -- Stimulant Use Disorder, Cocaine             -- Major Depressive Disorder, Recurrent, Severe             -- Post Traumatic Stress Disorder   PLAN: Safety and Monitoring:             -- Involuntary admission to inpatient psychiatric unit for safety, stabilization and treatment             -- Daily contact with patient to assess and evaluate symptoms and progress in treatment             -- Patient's case to be discussed in  multi-disciplinary team meeting             -- Observation Level : q15 minute checks             -- Vital signs:  q12 hours             -- Precautions: suicide, elopement, and assault   2. Psychiatric Diagnoses and Treatment:              -- Start prazosin 1 mg at bedtime for nightmares, insomnia -- Continue Ativan taper per protocol for alcohol withdrawal                         -- 1 mg Q6 for the first day (7/17)                         -- 1 mg Q8 for 2nd day (7/18)                         -- 1 mg Q12 for 3rd day (7/19)                         -- 1 mg Q24 for 4th day (7/20)                                     -- 2 mg injection  PRN as needed for seizures             -- Continue Paroxetine 20 mg daily for MDD             -- Continue Trazodone 50 mg at bedtime prn for Insomnia, MDD             -- Hydroxyzine 25 mg TID PRN for Anxiety --  The risks/benefits/side-effects/alternatives to this medication were discussed in detail with the patient and time was given for questions. The patient consents to medication trial.              -- Encouraged patient to participate in unit milieu and in scheduled group therapies              -- Short Term Goals: Ability to disclose and discuss suicidal ideas, Ability to identify and develop effective coping behaviors will improve, and Ability to identify triggers associated with substance abuse/mental health issues will improve             -- Long Term Goals: Improvement in symptoms so as ready for discharge                3. Medical Issues Being Addressed:              Labs review, notable for positive UDS for cocaine, elevated glucose, low calcium, albumin, and total protein, low WBC.              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             -- HTN - amlodipine 5 mg daily, clonidine 0.1 mg PRN             -- Malnutrition due to alcohol - multivitamin daily, B1 100 mg daily,   4. Discharge Planning:              -- Social  work and case management to assist with discharge planning and identification of hospital follow-up needs prior to discharge             -- Estimated LOS: 5-7 days             -- Discharge Concerns: Need to establish a safety plan; Medication compliance and effectiveness             -- Discharge Goals: Rehab facility I certify that inpatient services furnished can reasonably be expected to improve the patient's condition.     [name], MD 03/19/2023, 12:14 PM

## 2023-03-19 NOTE — [facility] Group Notes (Signed)
Adult Psychoeducational Group Note  Date:  03/19/2023 Time:  10:06 AM  Group Topic/Focus:  Goals Group:   The focus of this group is to help patients establish daily goals to achieve during treatment and discuss how the patient can incorporate goal setting into their daily lives to aide in recovery. Orientation:   The focus of this group is to educate the patient on the purpose and policies of crisis stabilization and provide a format to answer questions about their admission.  The group details unit policies and expectations of patients while admitted.  Participation Level:  Active  Participation Quality:  Appropriate  Affect:  Appropriate  Cognitive:  Appropriate  Insight: Appropriate  Engagement in Group:  Engaged  Modes of Intervention:  Discussion  Additional Comments:  Pt attended the goals group and remained appropriate and engaged throughout the duration of the group.   Fara Olden O 03/19/2023, 10:06 AM

## 2023-03-19 NOTE — Group Note (Signed)
Date:  03/19/2023 Time:  12:17 PM                                                 [facility] LCSW Group Therapy Note    Group Date: @GROUPDATE @ Start Time: @GROUPSTARTTIME @ End Time: @GROUPENDTIME @  Type of Therapy and Topic:  Group Therapy:  Overcoming Obstacles  Participation Level:    Mood:  Description of Group:   In this group patients will be encouraged to explore what they see as obstacles to their own wellness and recovery. They will be guided to discuss their thoughts, feelings, and behaviors related to these obstacles. The group will process together ways to cope with barriers, with attention given to specific choices patients can make. Each patient will be challenged to identify changes they are motivated to make in order to overcome their obstacles. This group will be process-oriented, with patients participating in exploration of their own experiences as well as giving and receiving support and challenge from other group members.  Therapeutic Goals: 1. Patient will identify personal and current obstacles as they relate to admission. 2. Patient will identify barriers that currently interfere with their wellness or overcoming obstacles.  3. Patient will identify feelings, thought process and behaviors related to these barriers. 4. Patient will identify two changes they are willing to make to overcome these obstacles:    Summary of Patient Progress      Therapeutic Modalities:   Cognitive Behavioral Therapy Solution Focused Therapy Motivational Interviewing Relapse Prevention Therapy   [name] [name] Topic/Focus:  Developing a Wellness Toolbox:   The focus of this group is to help patients develop a "wellness toolbox" with skills and strategies to promote recovery upon discharge. Wellness Toolbox:   The focus of this group is to discuss various aspects of wellness, balancing those aspects and exploring ways to increase the ability to experience wellness.  Patients  will create a wellness toolbox for use upon discharge.    Participation Level:  Did Not Attend  Participation Quality:    Affect:    Cognitive:    Insight:   Engagement in Group:    Modes of Intervention:    Additional Comments:    [name] [name] 03/19/2023, 12:17 PM

## 2023-03-19 NOTE — [facility] Group Notes (Signed)
Tia did not attend wrap up group.

## 2023-03-19 NOTE — Plan of Care (Signed)

## 2023-03-19 NOTE — Progress Notes (Signed)
[facility] LCSW Progress Note   03/19/2023    1:06 PM  Aggie Hacker      Type of Note: [place] Referral    CSW faxed patient referral today and is awaiting to hear from admission coordinator about next steps.     Signed:   Jacob Moores, MSW, [facility] 03/19/2023 1:06 PM

## 2023-03-19 NOTE — [facility] Suicide Risk Assessment (Signed)
[facility] INPATIENT:  Family/Significant Other Suicide Prevention Education  Suicide Prevention Education:  Education Completed; Ms. [name] [phone],  (name of family member/significant other) has been identified by the patient as the family member/significant other with whom the patient will be residing, and identified as the person(s) who will aid the patient in the event of a mental health crisis (suicidal ideations/suicide attempt).  With written consent from the patient, the family member/significant other has been provided the following suicide prevention education, prior to the and/or following the discharge of the patient.  Safety planning was completed with [name]. [name] shared that patient is always in and out of the hospital and has a drug problem. Also shared that his family is not supportive, dad is always telling him that he is " not shit " and very hateful towards patient. Ms. [name] was made aware of [place] referral and the next steps , CSW will inform [name] of what patient will need before going to residential. Patient does not own or have access to any guns or weapons.   The suicide prevention education provided includes the following: Suicide risk factors Suicide prevention and interventions National Suicide Hotline telephone number [facility] assessment telephone number [facility] Emergency Assistance [facility] and/or Residential Mobile Crisis Unit telephone number  Request made of family/significant other to: Remove weapons (Marcus Harrisg., guns, rifles, knives), all items previously/currently identified as safety concern.   Remove drugs/medications (over-the-counter, prescriptions, illicit drugs), all items previously/currently identified as a safety concern.  The family member/significant other verbalizes understanding of the suicide prevention education information provided.  The family member/significant other agrees to remove the items of safety concern listed  above.  [name] 03/19/2023, 1:44 PM

## 2023-03-20 MED ORDER — ALBUTEROL SULFATE HFA 108 (90 BASE) MCG/ACT IN AERS: 1.0000 | INHALATION_SPRAY | RESPIRATORY_TRACT | Status: DC | PRN

## 2023-03-20 MED ORDER — PAROXETINE HCL 10 MG PO TABS
30.0000 mg | ORAL_TABLET | Freq: Every day | ORAL | Status: DC
  Administered 2023-03-21: 30 mg via ORAL
  Filled 2023-03-20 (×2): qty 3

## 2023-03-20 NOTE — Progress Notes (Signed)
   03/19/23 2110  Psych Admission Type (Psych Patients Only)  Admission Status Involuntary  Psychosocial Assessment  Patient Complaints Anxiety;Depression  Eye Contact Fair  Facial Expression Sad  Affect Sad  Speech Logical/coherent;Soft  Interaction Assertive  Motor Activity Fidgety;Slow  Appearance/Hygiene In scrubs  Behavior Characteristics Cooperative;Anxious  Mood Depressed;Anxious  Thought Process  Coherency Circumstantial  Content Blaming others;Blaming self  Delusions None reported or observed  Perception WDL  Hallucination None reported or observed  Judgment WDL  Confusion WDL  Danger to Self  Current suicidal ideation? Denies  Agreement Not to Harm Self Yes  Description of Agreement verbal  Danger to Others  Danger to Others None reported or observed   Pt reported 7/10 depression and anxiety. Pt is pleasant and cooperative. Pt was offered support and encouragement. Pt was given scheduled medications. Given PRN Hydroxyzine per MAR. Q 15 minute checks were done for safety. Pt attended group and interacts well with peers and staff. Pt has no complaints.Pt receptive to treatment and safety maintained on unit.

## 2023-03-20 NOTE — Progress Notes (Signed)
   03/20/23 0556  15 Minute Checks  Location Bedroom  Visual Appearance Calm  Behavior Sleeping  Sleep (Behavioral Health Patients Only)  Calculate sleep? (Click Yes once per 24 hr at 0600 safety check) Yes  Documented sleep last 24 hours 9

## 2023-03-20 NOTE — Group Note (Signed)
Recreation Therapy Group Note   Group Topic:Communication  Group Date: 03/20/2023 Start Time: 1000 End Time: 1025 Facilitators: Evanthia Maund-McCall, LRT,CTRS Location: [address] Dayroom   Goal Area(s) Addresses:  Patient will effectively listen to complete activity.  Patient will identify communication skills used to make activity successful.  Patient will identify how skills used during activity can be used to reach post d/c goals.    Group Description: Geometric Drawings.  Three volunteers from the peer group will be shown an abstract picture with a particular arrangement of geometrical shapes.  Each round, one 'speaker' will describe the pattern, as accurately as possible without revealing the image to the group.  The remaining group members will listen and draw the picture to reflect how it is described to them. Patients with the role of 'listener' cannot ask clarifying questions but, may request that the speaker repeat a direction. Once the drawings are complete, the presenter will show the rest of the group the picture and compare how close each person came to drawing the picture. LRT will facilitate a post-activity discussion regarding effective communication and the importance of planning, listening, and asking for clarification in daily interactions with others.   Affect/Mood: N/A   Participation Level: Did not attend    Clinical Observations/Individualized Feedback:    Plan: Continue to engage patient in RT group sessions 2-3x/week.   [name], LRT,CTRS 03/20/2023 12:27 PM

## 2023-03-20 NOTE — Progress Notes (Signed)
D:  Patient denied SI and HI, contracts for safety.  Denied A/V hallucinations. A:  Medications administered per MD orders.  Emotional support and encouragement given patient. R:  Safety maintained with 15 minute checks.  Patient had nose bleed this morning.  MD talked to patient.   Patient felt better and went to morning group.  Patient talking to patients and staff.

## 2023-03-20 NOTE — Progress Notes (Signed)
[facility] LCSW Progress Note   03/20/2023    1:20 PM  [name]      Type of Note: [employer]    CSW called residential and spoke with Leotis Shames in admissions who shared that patient was accepted for treatment and transportation has been arranged for Tuesday. Patient was made aware of acceptance as well as doctor.     Signed:   Jacob Moores, MSW, LCSWA 03/20/2023 1:20 PM

## 2023-03-20 NOTE — Progress Notes (Signed)
[facility] MD Progress Note  03/20/2023 12:43 PM [name]  MRN:  [id number] Principal Problem: MDD (major depressive disorder), recurrent, severe, with psychosis (HCC) Diagnosis: Principal Problem:   MDD (major depressive disorder), recurrent, severe, with psychosis (HCC) Active Problems:   GAD (generalized anxiety disorder)   Alcohol dependence with alcohol-induced mood disorder (HCC)   Stimulant use disorder   PTSD (post-traumatic stress disorder)  Subjective:   [name] is a 89 y M with past psychiatric history of MDD, PTSD, polysubstance use and past medical history of obstructive sleep apnea and repeated episodes of chest pain who presented to the ED under IVC in the setting of auditory and visual hallucinations, increased substance use, and suicidal ideations. Patient medically cleared and admitted to [facility].   Case was discussed in the multidisciplinary team. MAR was reviewed and patient fully compliant with medications.   PRNs from last 24 hours: - 07/18 0642 Clonidine 0.1 mg - 07/18 0819 hydroxyzine 25 mg - 07/18 2113 hydroxyzine 25 mg - 07/19 0957 Tylenol 650 mg   Psychiatric Team made the following recommendations yesterday:             -- Start prazosin 1 mg at bedtime for nightmares, insomnia -- Continue Ativan taper per protocol for alcohol withdrawal                         -- 1 mg Q6 for the first day (7/17)                         -- 1 mg Q8 for 2nd day (7/18)                         -- 1 mg Q12 for 3rd day (7/19)                         -- 1 mg Q24 for 4th day (7/20)                                     -- 2 mg injection PRN as needed for seizures             -- Continue Paroxetine 20 mg daily for MDD             -- Continue Trazodone 50 mg at bedtime prn for Insomnia, MDD             -- Hydroxyzine 25 mg TID PRN for Anxiety  Today on interview, met patient in his room this morning. He followed to the large group room. He mentioned that he slept poorly overnight  due to nightmares and anxiety about falling back asleep. His nightmare involved some of the violent trauma that he experienced in his distant past. Patient was tearful this morning when he discussed what his past year has been like. He has three assignments today: one, identify a list of his personal values. Two, identify some of his personal strengths. Three, identify some activities that he could get involved with and how they would translate his values and strengths into practice.  Sleep: Poor Appetite:  Good Depression: High Anxiety: High Auditory Hallucinations: Denies Visual Hallucinations: Denies Paranoia: Denies HI: Denies SI: Says that he has thought about but does not own guns. "I'm just tired of all  this. This life." Side effects from medications: does not endorse any side-effects they attribute to medications. Other concerns discussed with patient:  Total time spent with patient: 30 minutes  Past psychiatric history:  Inpatient hospitalization for self-harm in [place] in remote past, 3 inpatient rehab stays. History significant for: MDD, PTSD, polysubstance use. Rehab and detoxification several times in the past, most recent in 2022. Complete sobriety in 2013-2016.   Past Medical History:  Past Medical History:  Diagnosis Date   Alcohol dependence with withdrawal (HCC)    Anxiety    Arrhythmia    Asthma    Atrial fibrillation (HCC)    not on AC   Bipolar 1 disorder (HCC)    Class 2 obesity    Cocaine abuse (HCC)    Depression    Heart palpitations    with severe anxiety   Hyperlipidemia    Hypertension    PTSD (post-traumatic stress disorder)    PTSD (post-traumatic stress disorder)    Seizure (HCC)    Syncope and collapse     Past Surgical History:  Procedure Laterality Date   NO PAST SURGERIES     Family History:  Family History  Problem Relation Age of Onset   Alcohol abuse Mother    Heart disease Mother    Hypertension Father    Alcohol abuse  Maternal Grandfather    Family Psychiatric History:  two deaths by suicide on maternal side   Social History:  Social History   Substance and Sexual Activity  Alcohol Use Yes     Social History   Substance and Sexual Activity  Drug Use Yes   Types: Cocaine    Social History   Socioeconomic History   Marital status: Single    Spouse name: Not on file   Number of children: Not on file   Years of education: Not on file   Highest education level: Not on file  Occupational History   Occupation: disabled  Tobacco Use   Smoking status: Never    Passive exposure: Current (Indoor smoking)   Smokeless tobacco: Never  Vaping Use   Vaping status: Never Used  Substance and Sexual Activity   Alcohol use: Yes   Drug use: Yes    Types: Cocaine   Sexual activity: Not Currently    Comment: pt said he has an enlarged prostate  Other Topics Concern   Not on file  Social History Narrative   ** Merged History Encounter **       Social Determinants of Health   Financial Resource Strain: Not on file  Food Insecurity: Food Insecurity Present (03/17/2023)   Hunger Vital Sign    Worried About Running Out of Food in the Last Year: Often true    Ran Out of Food in the Last Year: Sometimes true  Transportation Needs: Unmet Transportation Needs (03/17/2023)   PRAPARE - Administrator, Civil Service (Medical): Yes    Lack of Transportation (Non-Medical): Yes  Physical Activity: Not on file  Stress: Not on file  Social Connections: Unknown (06/13/2022)   Received from [employer] [facility]   Family and [employer] Support    Help with Day-to-Day Activities: Not on file    Lonely or Isolated: Not on file   Additional Social History:   Collateral obtained from:  Current Medications: Current Facility-Administered Medications  Medication Dose Route Frequency Provider Last Rate Last Admin   acetaminophen (TYLENOL) tablet 650 mg  650 mg Oral Q6H  PRN Onuoha, Chinwendu V, NP   650 mg at 03/20/23 0957   albuterol (VENTOLIN HFA) 108 (90 Base) MCG/ACT inhaler 1-2 puff  1-2 puff Inhalation Q4H PRN Massengill, Harrold Donath, MD       alum & mag hydroxide-simeth (MAALOX/MYLANTA) 200-200-20 MG/5ML suspension 30 mL  30 mL Oral Q4H PRN Onuoha, Chinwendu V, NP       amLODipine (NORVASC) tablet 10 mg  10 mg Oral Daily [name], MD   10 mg at 03/20/23 1610   aspirin-acetaminophen-caffeine (EXCEDRIN MIGRAINE) per tablet 1 tablet  1 tablet Oral Q8H PRN [name], MD       cloNIDine (CATAPRES) tablet 0.1 mg  0.1 mg Oral Q4H PRN Phineas Inches, MD   0.1 mg at 03/19/23 9604   diphenhydrAMINE (BENADRYL) capsule 50 mg  50 mg Oral TID PRN Onuoha, Chinwendu V, NP       Or   diphenhydrAMINE (BENADRYL) injection 50 mg  50 mg Intramuscular TID PRN Onuoha, Chinwendu V, NP       haloperidol (HALDOL) tablet 5 mg  5 mg Oral TID PRN Onuoha, Chinwendu V, NP       Or   haloperidol lactate (HALDOL) injection 5 mg  5 mg Intramuscular TID PRN Onuoha, Chinwendu V, NP       hydrOXYzine (ATARAX) tablet 25 mg  25 mg Oral TID PRN Onuoha, Chinwendu V, NP   25 mg at 03/19/23 2113   loperamide (IMODIUM) capsule 2-4 mg  2-4 mg Oral PRN Massengill, Harrold Donath, MD       LORazepam (ATIVAN) tablet 2 mg  2 mg Oral TID PRN Onuoha, Chinwendu V, NP       Or   LORazepam (ATIVAN) injection 2 mg  2 mg Intramuscular TID PRN Onuoha, Chinwendu V, NP       LORazepam (ATIVAN) injection 2 mg  2 mg Intramuscular BID PRN Massengill, Harrold Donath, MD       LORazepam (ATIVAN) tablet 1 mg  1 mg Oral Q6H PRN Massengill, Nathan, MD       magnesium hydroxide (MILK OF MAGNESIA) suspension 30 mL  30 mL Oral Daily PRN Onuoha, Chinwendu V, NP       multivitamin with minerals tablet 1 tablet  1 tablet Oral Daily Massengill, Nathan, MD   1 tablet at 03/20/23 0953   ondansetron (ZOFRAN-ODT) disintegrating tablet 4 mg  4 mg Oral Q6H PRN Massengill, Harrold Donath, MD       Melene Muller ON  03/21/2023] PARoxetine (PAXIL) tablet 30 mg  30 mg Oral Daily [name], MD       prazosin (MINIPRESS) capsule 1 mg  1 mg Oral QHS [name], MD   1 mg at 03/19/23 2113   thiamine (Vitamin B-1) tablet 100 mg  100 mg Oral Daily Massengill, Harrold Donath, MD   100 mg at 03/20/23 0954   traZODone (DESYREL) tablet 50 mg  50 mg Oral QHS PRN Onuoha, Chinwendu V, NP        Lab Results:  No results found for this or any previous visit (from the past 48 hour(s)).   Blood Alcohol level:  Lab Results  Component Value Date   ETH <10 03/16/2023   ETH 92 (H) 03/04/2023    Metabolic Disorder Labs: Lab Results  Component Value Date   HGBA1C 6.0 (H) 06/20/2020   MPG 126 06/20/2020   No results found for: "PROLACTIN" Lab Results  Component Value Date   CHOL 130 06/19/2020  TRIG 67 06/19/2020   HDL 27 (L) 06/19/2020   CHOLHDL 4.8 06/19/2020   VLDL 13 06/19/2020   LDLCALC 90 06/19/2020    Physical Findings: AIMS:  , ,  ,  ,    CIWA:  CIWA-Ar Total: 0 COWS:     Musculoskeletal: Strength & Muscle Tone: within normal limits Gait & Station: normal Patient leans: N/A  Psychiatric Specialty Exam:  Presentation  General Appearance:   Appropriate for Environment; Casual   Eye Contact:  Good  Speech:  Clear and Coherent; Slow (stutter, improved since yesterday)  Speech Volume:  Decreased  Handedness:  Right    Mood and Affect  Mood:  Dysphoric; Depressed  Affect:  Congruent   Thought Process  Thought Processes:  Coherent; Goal Directed; Linear  Descriptions of Associations: Intact  Orientation: Full (Time, Place and Person)  Thought Content: Rumination; Perseveration (still ruminating about his own failures)  History of Schizophrenia/Schizoaffective disorder: No  Duration of Psychotic Symptoms: No data recorded Hallucinations: Hallucinations: None Description of Auditory Hallucinations: Denied hallucinations at this  time.  Ideas of Reference: None  Suicidal Thoughts: Suicidal Thoughts: No SI Passive Intent and/or Plan: With Plan  Homicidal Thoughts: Homicidal Thoughts: No   Sensorium  Memory: Immediate Fair; Recent Good; Remote Good   Judgment:  Fair   Insight:  Good    Executive Functions  Concentration:  Good  Attention Span:  Good  Recall:  Fair  Fund of Knowledge:  Fair  Language:  Good  Psychomotor Activity  Psychomotor Activity:  Psychomotor Activity: Normal   Physical Exam: Vitals and nursing note reviewed.  Constitutional:      Appearance: He is obese.  HENT:     Head: Normocephalic and atraumatic.  Pulmonary:     Effort: Pulmonary effort is normal.  Skin:    General: Skin is warm and dry.     Comments: Pt has extensive scarring across both arms indicative of prolonged pattern of self-harm via cutting. Pt also has numerous circular wounds in various stages of healing. Pt reports additional scarring on legs from similar.  Neurological:     General: No focal deficit present.     Mental Status: He is alert and oriented to person, place, and time.      Review of Systems: Respiratory:  Negative for cough.   Cardiovascular:  Negative for palpitations. Negative for chest pain.  Musculoskeletal:  Positive for neck pain.  Psychiatric/Behavioral:  Positive for depression, substance abuse. Denies suicidal ideation. The patient is nervous/anxious.    Blood pressure (!) 122/94, pulse 75, temperature 98.3 F (36.8 C), temperature source Oral, resp. rate 18, height 5\' 6"  (1.676 m), weight 112.9 kg, SpO2 100%. Body mass index is 40.19 kg/m.  ASSESSMENT:  [name] is a 59 y M with past psychiatric history of MDD, PTSD, polysubstance use and past medical history of obstructive sleep apnea and repeated episodes of chest pain who presented to the ED under IVC in the setting of auditory and visual hallucinations, increased substance use, and suicidal ideations.  Patient medically cleared and admitted to [facility].   7/19: This morning on exam, Mr Bontrager was better than previous days despite waking up with a nosebleed. He was pleasant and responsive to all questions. His suicidal ideation has gone away for now (denied on exam), but the patient remains dysphoric and withdrawn. Set goals today for him leaving the room more and attending all the groups. He was agreeable to going up on his medication.  Increase the paroxetine to 30 mg with eventual daily goal of 40 mg (pt mentioned that this was his dose long ago when he was on it). Added on prazosin yesterday, which helped his sleep. Patient had an anterior nose bleed when he woke up this morning, which was controlled with pressure. In terms of discharge, he will be going to [facility], hopefully on Monday since the team would like to see him stable on an increased dose of paxil before discharge.   Diagnoses / Active Problems:             -- Alcohol Use Disorder, Chronic             -- Stimulant Use Disorder, Cocaine             -- Major Depressive Disorder, Recurrent, Severe             -- Post Traumatic Stress Disorder   PLAN: Safety and Monitoring:             -- Involuntary admission to inpatient psychiatric unit for safety, stabilization and treatment             -- Daily contact with patient to assess and evaluate symptoms and progress in treatment             -- Patient's case to be discussed in multi-disciplinary team meeting             -- Observation Level : q15 minute checks             -- Vital signs:  q12 hours             -- Precautions: suicide, elopement, and assault   2. Psychiatric Diagnoses and Treatment:              -- Continue prazosin 1 mg at bedtime for nightmares, insomnia -- Continue Ativan taper per protocol for alcohol withdrawal                         -- 1 mg Q12 for 3rd day (7/19)                         -- 1 mg Q24 for 4th day (7/20)                                      -- 2 mg injection PRN as needed for seizures             -- Increase Paroxetine to 30 mg daily for MDD             -- Continue Trazodone 50 mg at bedtime prn for Insomnia, MDD             -- Continue Hydroxyzine 25 mg TID PRN for Anxiety --  The risks/benefits/side-effects/alternatives to this medication were discussed in detail with the patient and time was given for questions. The patient consents to medication trial.              -- Encouraged patient to participate in unit milieu and in scheduled group therapies              -- Short Term Goals: Ability to disclose and discuss suicidal ideas, Ability to identify and develop effective coping behaviors will improve, and Ability to identify triggers associated with substance  abuse/mental health issues will improve             -- Long Term Goals: Improvement in symptoms so as ready for discharge                3. Medical Issues Being Addressed:              Labs review, notable for positive UDS for cocaine, elevated glucose, low calcium, albumin, and total protein, low WBC.              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             -- HTN - amlodipine 5 mg daily, clonidine 0.1 mg PRN             -- Malnutrition due to alcohol - multivitamin daily, B1 100 mg daily,   4. Discharge Planning:              -- Social work and case management to assist with discharge planning and identification of hospital follow-up needs prior to discharge             -- Estimated LOS: 5-7 days             -- Discharge Concerns: Need to establish a safety plan; Medication compliance and effectiveness             -- Discharge Goals: Rehab facility I certify that inpatient services furnished can reasonably be expected to improve the patient's condition.     [name], MD 03/20/2023, 12:43 PM

## 2023-03-20 NOTE — Plan of Care (Signed)
  Problem: Education: Goal: Emotional status will improve Outcome: Progressing Goal: Mental status will improve Outcome: Progressing   Problem: Activity: Goal: Sleeping patterns will improve Outcome: Progressing   

## 2023-03-20 NOTE — Progress Notes (Signed)
   03/20/23 2045  Psych Admission Type (Psych Patients Only)  Admission Status Voluntary  Psychosocial Assessment  Patient Complaints Anxiety;Depression  Eye Contact Fair  Facial Expression Sad  Affect Sad  Speech Logical/coherent  Interaction Assertive  Motor Activity Slow  Appearance/Hygiene Unremarkable  Behavior Characteristics Cooperative;Anxious  Mood Depressed;Anxious  Aggressive Behavior  Effect No apparent injury  Thought Process  Coherency Circumstantial  Content Blaming others  Delusions None reported or observed  Perception WDL  Hallucination None reported or observed  Judgment WDL  Confusion None  Danger to Self  Current suicidal ideation? Denies  Danger to Others  Danger to Others None reported or observed

## 2023-03-20 NOTE — Plan of Care (Signed)
  Problem: Coping: Goal: Ability to verbalize frustrations and anger appropriately will improve Outcome: Progressing   Problem: Safety: Goal: Periods of time without injury will increase Outcome: Progressing   

## 2023-03-20 NOTE — Plan of Care (Signed)
Nurse discussed anxiety, depression and coping skills with patient.  

## 2023-03-21 MED ORDER — GABAPENTIN 100 MG PO CAPS
100.0000 mg | ORAL_CAPSULE | Freq: Three times a day (TID) | ORAL | Status: DC
  Administered 2023-03-21 – 2023-03-24 (×10): 100 mg via ORAL
  Filled 2023-03-21 (×18): qty 1

## 2023-03-21 MED ORDER — PAROXETINE HCL 20 MG PO TABS
40.0000 mg | ORAL_TABLET | Freq: Every day | ORAL | Status: DC
  Administered 2023-03-22 – 2023-03-24 (×3): 40 mg via ORAL
  Filled 2023-03-21 (×5): qty 2

## 2023-03-21 NOTE — Progress Notes (Signed)
Pt did not attend wrap-up group   

## 2023-03-21 NOTE — [facility] Group Notes (Signed)
[facility] Group Notes:  (Nursing/MHT/Case Management/Adjunct)  Date:  03/21/2023  Time:  11:18 PM  Type of Therapy:   Group Wrap Up  Participation Level:  Active  Participation Quality:  Appropriate, Sharing, and Supportive  Affect:  Appropriate  Cognitive:  Alert and Appropriate  Insight:  Appropriate, Good, and Improving  Engagement in Group:  Engaged  Modes of Intervention:  Discussion, Education, Socialization, and Support  Summary of Progress/Problems: Pt expressed how this program is helping him in variety of ways. Pt was excited to have a plan outside of this program. Pt expressed " Im glad my social worker help me have a plan outside of here, as well as I'm glad for yall in group helping me stay positive and have a great mindset upon leaving here. Nobody looks down on me here and I can tell everyone here understands". Pt expressed more in group about where he is at mentally and wants to continue working on his goals to maintain his thinking process through his journey of healing.   [name] 03/21/2023, 11:18 PM

## 2023-03-21 NOTE — Group Note (Signed)
.  LCSW Group Therapy Note  03/21/2023    10:00-11:00am   Type of Therapy and Topic:  Early Messages Received About Anger  Participation Level:  Minimal   Description of Group:   In this group, patients shared and discussed the early messages received in their lives about anger through parental or other adult modeling, teaching, repression, punishment, violence, and more.  Participants identified how those lessons influence how they often react when angered.  The group discussed that anger is a secondary emotion caused by other feelings such as fear, judgment, shame, or embarrassment.  Some of the [employer] were discussed and it was recommended that they share the handout with their family or other important adults and discuss during a calm time how using these could help them communicate better and have improved outcomes.  The rules shared included "I" statements, taking a break, one person speaking at a time, not cursing or calling names, and only one topic being discussed at a time.  Therapeutic Goals: Patients will identify one or more childhood message about anger that they received and how it was taught to them. Patients will discuss how these childhood experiences have influenced and continue to influence their own expression or repression of anger even today. Patients will explore possible primary emotions that tend to fuel their secondary emotion of anger. Patients will learn that anger itself is normal and cannot be eliminated, and that healthier coping skills can assist with resolving conflict rather than worsening situations.  Summary of Patient Progress:  The patient shared that his childhood lessons about anger were that no matter how nice he was, people would still bully him and be angry.  His father was angry a lot of the time and took out his anger on the patient.  As a result, he still does not act meanly or in an angry manner toward anyone, but fully accepts the opposed to  be true of people around him.   The patient was open to the [employer] explained in group.  The patient participated fully and demonstrated insight.  Therapeutic Modalities:   Cognitive Behavioral Therapy Motivation Interviewing  Lynnell Chad, LCSW 03/21/2023 4:20 PM

## 2023-03-21 NOTE — Plan of Care (Signed)
Nurse discussed anxiety, depression and coping skills with patient.  

## 2023-03-21 NOTE — Plan of Care (Addendum)
D:  Patient denied SI and HI, contracts for safety.  Denied A/V hallucinations.   A:  Medications administered per MD orders.  Emotional support and encouragement given patient. R:  Safety maintained with 15 minute checks.  

## 2023-03-21 NOTE — Progress Notes (Addendum)
[facility] MD Progress Note  03/21/2023 6:44 AM [name]  MRN:  [id number] Principal Problem: MDD (major depressive disorder), recurrent, severe, with psychosis (HCC) Diagnosis: Principal Problem:   MDD (major depressive disorder), recurrent, severe, with psychosis (HCC) Active Problems:   GAD (generalized anxiety disorder)   Alcohol dependence with alcohol-induced mood disorder (HCC)   Stimulant use disorder   PTSD (post-traumatic stress disorder)  Subjective:   [name] is a 89 y M with past psychiatric history of MDD, PTSD, polysubstance use and past medical history of obstructive sleep apnea and repeated episodes of chest pain who presented to the ED under IVC in the setting of auditory and visual hallucinations, increased substance use, and suicidal ideations. Patient medically cleared and admitted to [facility]   Case was discussed in the multidisciplinary team. MAR was reviewed and patient fully compliant with medications.   PRNs from last 24 hours: - 07/19 2134  hydroxyzine 25 mg - 07/20 0639  Tylenol 650 mg  - 07/20 0833 hydroxyzine 25 mg  Psychiatric Team made the following recommendations yesterday:             -- Continue prazosin 1 mg at bedtime for nightmares, insomnia -- Continue Ativan taper per protocol for alcohol withdrawal                         -- 1 mg Q12 for 3rd day (7/19)                         -- 1 mg Q24 for 4th day (7/20)                                     -- 2 mg injection PRN as needed for seizures             -- Increase Paroxetine to 30 mg daily for MDD             -- Continue Trazodone 50 mg at bedtime prn for Insomnia, MDD             -- Continue Hydroxyzine 25 mg TID PRN for Anxiety  Today on interview, met with patient in large group room. Followed up with him on his homework from two days ago regarding identification of strengths. Shared with him the news that he had been accepted to the [facility] and that the "logistics" had  all been arranged. He does have lingering worries about having enough clothing to go with him, since most people stay a month or longer and he only has a few days' worth of clothing. This connects to his low self-esteem, he is preoccupied with how people will judge him based on his appearance. He mentioned this yesterday, but neglected to update that portion of the note. Asked more detail about his headaches, which he attributes to a lack of caffeine since he is not a coffee drinker and the soda fountain in the cafeteria is not on in the mornings. He reported that this morning his headache seems to have resolved and his only lingering pain is in his right leg.  Pain was present on admission, but was not a worsening factor at that time.  Pain is located on the lateral portion of the right thigh and extends from the hip down to his knee.    Sleep: Good (no  nightmares for two nights) Appetite:  Good Depression: Medium Anxiety: Medium Auditory Hallucinations: Denies Visual Hallucinations: Denies Paranoia: Denies HI: Denies SI: Denies Side effects from medications: does not endorse any side-effects they attribute to medications. Other concerns discussed with patient: Discussed his discharge plans and mentioned that he should continue to think about long-term options for his post-[place] planning to ensure that he can be successful in his substance use disorder struggle. He voiced hope that he could be a positive force for change.   Total time spent with patient: 30 minutes  Past psychiatric history:  Inpatient hospitalization for self-harm in [place] in remote past, 3 inpatient rehab stays. History significant for: MDD, PTSD, polysubstance use. Rehab and detoxification several times in the past, most recent in 2022. Complete sobriety in 2013-2016.   Past Medical History:  Past Medical History:  Diagnosis Date   Alcohol dependence with withdrawal (HCC)    Anxiety    Arrhythmia    Asthma     Atrial fibrillation (HCC)    not on AC   Bipolar 1 disorder (HCC)    Class 2 obesity    Cocaine abuse (HCC)    Depression    Heart palpitations    with severe anxiety   Hyperlipidemia    Hypertension    PTSD (post-traumatic stress disorder)    PTSD (post-traumatic stress disorder)    Seizure (HCC)    Syncope and collapse     Past Surgical History:  Procedure Laterality Date   NO PAST SURGERIES     Family History:  Family History  Problem Relation Age of Onset   Alcohol abuse Mother    Heart disease Mother    Hypertension Father    Alcohol abuse Maternal Grandfather    Family Psychiatric History:  two deaths by suicide on maternal side   Social History:  Social History   Substance and Sexual Activity  Alcohol Use Yes     Social History   Substance and Sexual Activity  Drug Use Yes   Types: Cocaine    Social History   Socioeconomic History   Marital status: Single    Spouse name: Not on file   Number of children: Not on file   Years of education: Not on file   Highest education level: Not on file  Occupational History   Occupation: disabled  Tobacco Use   Smoking status: Never    Passive exposure: Current (Indoor smoking)   Smokeless tobacco: Never  Vaping Use   Vaping status: Never Used  Substance and Sexual Activity   Alcohol use: Yes   Drug use: Yes    Types: Cocaine   Sexual activity: Not Currently    Comment: pt said he has an enlarged prostate  Other Topics Concern   Not on file  Social History Narrative   ** Merged History Encounter **       Social Determinants of Health   Financial Resource Strain: Not on file  Food Insecurity: Food Insecurity Present (03/17/2023)   Hunger Vital Sign    Worried About Running Out of Food in the Last Year: Often true    Ran Out of Food in the Last Year: Sometimes true  Transportation Needs: Unmet Transportation Needs (03/17/2023)   PRAPARE - Administrator, Civil Service (Medical): Yes     Lack of Transportation (Non-Medical): Yes  Physical Activity: Not on file  Stress: Not on file  Social Connections: Unknown (06/13/2022)   Received from [employer] [facility]   Family and [employer] Support    Help with Day-to-Day Activities: Not on file    Lonely or Isolated: Not on file   Additional Social History:   Collateral obtained from:  Current Medications: Current Facility-Administered Medications  Medication Dose Route Frequency Provider Last Rate Last Admin   acetaminophen (TYLENOL) tablet 650 mg  650 mg Oral Q6H PRN Onuoha, Chinwendu V, NP   650 mg at 03/21/23 0639   albuterol (VENTOLIN HFA) 108 (90 Base) MCG/ACT inhaler 1-2 puff  1-2 puff Inhalation Q4H PRN Massengill, Harrold Donath, MD       alum & mag hydroxide-simeth (MAALOX/MYLANTA) 200-200-20 MG/5ML suspension 30 mL  30 mL Oral Q4H PRN Onuoha, Chinwendu V, NP       amLODipine (NORVASC) tablet 10 mg  10 mg Oral Daily [name], MD   10 mg at 03/20/23 4098   aspirin-acetaminophen-caffeine (EXCEDRIN MIGRAINE) per tablet 1 tablet  1 tablet Oral Q8H PRN [name], MD       cloNIDine (CATAPRES) tablet 0.1 mg  0.1 mg Oral Q4H PRN Phineas Inches, MD   0.1 mg at 03/19/23 1191   diphenhydrAMINE (BENADRYL) capsule 50 mg  50 mg Oral TID PRN Onuoha, Chinwendu V, NP       Or   diphenhydrAMINE (BENADRYL) injection 50 mg  50 mg Intramuscular TID PRN Onuoha, Chinwendu V, NP       haloperidol (HALDOL) tablet 5 mg  5 mg Oral TID PRN Onuoha, Chinwendu V, NP       Or   haloperidol lactate (HALDOL) injection 5 mg  5 mg Intramuscular TID PRN Onuoha, Chinwendu V, NP       hydrOXYzine (ATARAX) tablet 25 mg  25 mg Oral TID PRN Onuoha, Chinwendu V, NP   25 mg at 03/20/23 2134   loperamide (IMODIUM) capsule 2-4 mg  2-4 mg Oral PRN Massengill, Harrold Donath, MD       LORazepam (ATIVAN) tablet 2 mg  2 mg Oral TID PRN Onuoha, Chinwendu V, NP       Or   LORazepam (ATIVAN) injection 2  mg  2 mg Intramuscular TID PRN Onuoha, Chinwendu V, NP       LORazepam (ATIVAN) injection 2 mg  2 mg Intramuscular BID PRN Massengill, Harrold Donath, MD       LORazepam (ATIVAN) tablet 1 mg  1 mg Oral Q6H PRN Massengill, Nathan, MD       magnesium hydroxide (MILK OF MAGNESIA) suspension 30 mL  30 mL Oral Daily PRN Onuoha, Chinwendu V, NP       multivitamin with minerals tablet 1 tablet  1 tablet Oral Daily Massengill, Nathan, MD   1 tablet at 03/20/23 0953   ondansetron (ZOFRAN-ODT) disintegrating tablet 4 mg  4 mg Oral Q6H PRN Massengill, Harrold Donath, MD       PARoxetine (PAXIL) tablet 30 mg  30 mg Oral Daily [name], MD       prazosin (MINIPRESS) capsule 1 mg  1 mg Oral QHS [name], MD   1 mg at 03/20/23 2134   thiamine (Vitamin B-1) tablet 100 mg  100 mg Oral Daily Massengill, Harrold Donath, MD   100 mg at 03/20/23 0954   traZODone (DESYREL) tablet 50 mg  50 mg Oral QHS PRN Onuoha, Chinwendu V, NP        Lab Results:  No results found for this or any previous visit (from the past 48 hour(s)).   Blood  Alcohol level:  Lab Results  Component Value Date   ETH <10 03/16/2023   ETH 92 (H) 03/04/2023    Metabolic Disorder Labs: Lab Results  Component Value Date   HGBA1C 6.0 (H) 06/20/2020   MPG 126 06/20/2020   No results found for: "PROLACTIN" Lab Results  Component Value Date   CHOL 130 06/19/2020   TRIG 67 06/19/2020   HDL 27 (L) 06/19/2020   CHOLHDL 4.8 06/19/2020   VLDL 13 06/19/2020   LDLCALC 90 06/19/2020    Physical Findings: AIMS:  , ,  ,  ,    CIWA:  CIWA-Ar Total: 1 COWS:     Musculoskeletal: Strength & Muscle Tone: within normal limits Gait & Station: normal Patient leans: N/A  Psychiatric Specialty Exam:  Presentation  General Appearance:   Appropriate for Environment; Casual   Eye Contact:  Good  Speech:  Clear and Coherent; Slow (stutter, improved since yesterday)  Speech Volume:  Decreased  Handedness:   Right    Mood and Affect  Mood:  Dysphoric; Depressed  Affect:  Congruent   Thought Process  Thought Processes:  Coherent; Goal Directed; Linear  Descriptions of Associations: Intact  Orientation: Full (Time, Place and Person)  Thought Content: Rumination; Perseveration (still ruminating about his own failures)  History of Schizophrenia/Schizoaffective disorder: No  Duration of Psychotic Symptoms: No data recorded Hallucinations: Hallucinations: None  Ideas of Reference: None  Suicidal Thoughts: Suicidal Thoughts: No  Homicidal Thoughts: Homicidal Thoughts: No   Sensorium  Memory: Immediate Fair; Recent Good; Remote Good   Judgment:  Fair   Insight:  Good    Executive Functions  Concentration:  Good  Attention Span:  Good  Recall:  Fair  Fund of Knowledge:  Fair  Language:  Good  Psychomotor Activity  Psychomotor Activity:  Psychomotor Activity: Normal   Physical Exam: Vitals and nursing note reviewed.  Constitutional:      Appearance: He is obese.  HENT:     Head: Normocephalic and atraumatic.  Pulmonary:     Effort: Pulmonary effort is normal.  Skin:    General: Skin is warm and dry.     Comments: Pt has extensive scarring across both arms indicative of prolonged pattern of self-harm via cutting. Pt also has numerous circular wounds in various stages of healing. Pt reports additional scarring on legs from similar.  Neurological:     General: No focal deficit present.     Mental Status: He is alert and oriented to person, place, and time.      Review of Systems: Respiratory:  Negative for cough.   Cardiovascular:  Negative for palpitations. Negative for chest pain.  Musculoskeletal:  Positive for neck pain.  Psychiatric/Behavioral:  Positive for depression, substance abuse. Denies suicidal ideation. The patient is nervous/anxious.    Blood pressure (!) 105/90, pulse (!) 119, temperature 98 F (36.7 C), temperature source  Oral, resp. rate 18, height 5\' 6"  (1.676 m), weight 112.9 kg, SpO2 97%. Body mass index is 40.19 kg/m.  ASSESSMENT:  [name] is a 40 y M with past psychiatric history of MDD, PTSD, polysubstance use and past medical history of obstructive sleep apnea and repeated episodes of chest pain who presented to the ED under IVC in the setting of auditory and visual hallucinations, increased substance use, and suicidal ideations. Patient medically cleared and admitted to [facility].   7/20: This morning on exam, [name] was better than yesterday. He laughed a few times in appropriate ways, and even  made jokes of his own. Seems to be deriving genuine benefit from his time on the unit. Mentioned how helpful the group sharing exercise yesterday had been for him. [place] Spaniel that another patient mentioned he should be a motivational speaker. Reinforced to him that this is a reasonable and achievable goal for him, as there are some recovery programs that pay peer drug abuse support counselors. Also discussed NA and AA programs with him and mentioned that it would be a good way to continue building his network of sober friends.   Increasing the paroxetine to 40 mg. Added on prazosin two days ago, which helped his sleep. Pt will be discharged to [facility] on Tuesday, Jul 23rd.   His pattern of leg pain is most consistent with an iliotibial band injury or sprain. Discussed with patient that we can help treat the pain but medicines would not treat the underlying source of musculoskeletal pain. He should consider moving around more and participating in any range of motion exercises that are offered. Patient would benefit from gabapentin consider an anti-inflammatory if pain is not relieved. Gabapentin would also help with his anxiety and substance use disorder.   Diagnoses / Active Problems:             -- Alcohol Use Disorder, Chronic             -- Stimulant Use Disorder, Cocaine             -- Major  Depressive Disorder, Recurrent, Severe             -- Post Traumatic Stress Disorder   PLAN: Safety and Monitoring:             -- Involuntary admission to inpatient psychiatric unit for safety, stabilization and treatment             -- Daily contact with patient to assess and evaluate symptoms and progress in treatment             -- Patient's case to be discussed in multi-disciplinary team meeting             -- Observation Level : q15 minute checks             -- Vital signs:  q12 hours             -- Precautions: suicide, elopement, and assault   2. Psychiatric Diagnoses and Treatment:              -- Start Gabapentin 100 mg TID for leg pain, AUD, anxiety -- Continue prazosin 1 mg at bedtime for nightmares, insomnia -- Completing Ativan taper per protocol             -- Increase Paroxetine to 40 mg daily for MDD             -- Continue Trazodone 50 mg at bedtime prn for Insomnia, MDD             -- Continue Hydroxyzine 25 mg TID PRN for Anxiety --  The risks/benefits/side-effects/alternatives to this medication were discussed in detail with the patient and time was given for questions. The patient consents to medication trial.              -- Encouraged patient to participate in unit milieu and in scheduled group therapies              -- Short Term Goals: Ability to disclose and discuss suicidal ideas, Ability to identify  and develop effective coping behaviors will improve, and Ability to identify triggers associated with substance abuse/mental health issues will improve             -- Long Term Goals: Improvement in symptoms so as ready for discharge                3. Medical Issues Being Addressed:              Labs review, notable for positive UDS for cocaine, elevated glucose, low calcium, albumin, and total protein, low WBC.              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             -- HTN - amlodipine 5 mg daily, clonidine 0.1 mg PRN              -- Malnutrition due to alcohol - multivitamin daily, B1 100 mg daily,   4. Discharge Planning:              -- Social work and case management to assist with discharge planning and identification of hospital follow-up needs prior to discharge             -- Estimated LOS: 5-7 days             -- Discharge Concerns: Need to establish a safety plan; Medication compliance and effectiveness             -- Discharge Goals: Rehab facility I certify that inpatient services furnished can reasonably be expected to improve the patient's condition.     [name], MD 03/21/2023, 6:44 AM

## 2023-03-22 NOTE — [facility] Group Notes (Signed)
[facility] Group Notes:  (Nursing/MHT/Case Management/Adjunct)  Date:  03/22/2023  Time:  1:12 PM  Type of Therapy:  Psychoeducational Skills  Participation Level:  Active  Participation Quality:  Appropriate  Affect:  Appropriate  Cognitive:  Appropriate  Insight:  Appropriate  Engagement in Group:  Engaged  Modes of Intervention:  Discussion, Education, and Exploration  Summary of Progress/Problems: Patient attended group in which a podcast was played by Berniece Pap from [employer] discussing mental health and wellness. Tips and tricks on identifying how to support mental wellbeing. Pt attended and was appropriate.  Malva Limes 03/22/2023, 1:12 PM

## 2023-03-22 NOTE — Progress Notes (Signed)
   03/22/23 1008  Psych Admission Type (Psych Patients Only)  Admission Status Voluntary  Psychosocial Assessment  Patient Complaints Anxiety;Depression  Eye Contact Fair  Facial Expression Flat  Affect Depressed;Sad  Speech Logical/coherent  Interaction Assertive  Motor Activity Slow  Appearance/Hygiene Unremarkable  Behavior Characteristics Cooperative;Appropriate to situation  Mood Depressed;Anxious  Thought Process  Coherency WDL  Content WDL  Delusions None reported or observed  Perception WDL  Hallucination None reported or observed  Judgment WDL  Confusion None  Danger to Self  Current suicidal ideation? Denies  Self-Injurious Behavior No self-injurious ideation or behavior indicators observed or expressed   Agreement Not to Harm Self Yes  Description of Agreement verbal  Danger to Others  Danger to Others None reported or observed

## 2023-03-22 NOTE — Progress Notes (Signed)
D) Pt received calm, visible, participating in milieu, and in no acute distress. Pt A & O x4. Pt denies SI, HI, A/ V H, depression, anxiety and pain at this time. A) Pt encouraged to drink fluids. Pt encouraged to come to staff with needs. Pt encouraged to attend and participate in groups. Pt encouraged to set reachable goals.  R) Pt remained safe on unit, in no acute distress, will continue to assess.     03/22/23 2100  Psych Admission Type (Psych Patients Only)  Admission Status Voluntary  Psychosocial Assessment  Patient Complaints Anxiety  Eye Contact Fair  Facial Expression Sad  Affect Sad  Speech Logical/coherent  Interaction Assertive  Motor Activity Slow  Appearance/Hygiene Unremarkable  Behavior Characteristics Combative;Appropriate to situation  Mood Depressed  Thought Process  Coherency Circumstantial  Content WDL  Delusions None reported or observed  Perception WDL  Hallucination None reported or observed  Judgment WDL  Confusion None  Danger to Self  Current suicidal ideation? Denies  Agreement Not to Harm Self Yes  Description of Agreement verbal  Danger to Others  Danger to Others None reported or observed

## 2023-03-22 NOTE — Progress Notes (Signed)
[facility] MD Progress Note  03/22/2023 5:10 PM [name]  MRN:  [id number] Principal Problem: MDD (major depressive disorder), recurrent, severe, with psychosis (HCC) Diagnosis: Principal Problem:   MDD (major depressive disorder), recurrent, severe, with psychosis (HCC) Active Problems:   PTSD (post-traumatic stress disorder)   Stimulant use disorder   Alcohol dependence with alcohol-induced mood disorder (HCC)   GAD (generalized anxiety disorder)  Reason for admission:  [name] is a 52 y M with past psychiatric history of MDD, PTSD, polysubstance use and past medical history of obstructive sleep apnea and repeated episodes of chest pain who presented to the ED under IVC in the setting of auditory and visual hallucinations, increased substance use, and suicidal ideations. Patient medically cleared and admitted to [facility].  Yesterday the psychiatry team made the following recommendations:  - Continue gabapentin 100 mg TID for leg pain, AUD, anxiety -- Continue prazosin 1 mg at bedtime for nightmares, insomnia -- Completing Ativan taper per protocol -- Continue paroxetine to 40 mg daily for MDD   -- Continue Trazodone 50 mg at bedtime prn for Insomnia, MDD  -- Continue Hydroxyzine 25 mg TID PRN for Anxiety --Continue amlodipine 10 mg p.o. daily for high blood pressure  On assessment today, the pt reports that their mood is less depressed and improving Reports that anxiety is i at manageable level Nursing staff report patient sleeping over 7 hours last night and being restful  Appetite is good Concentration is very good Energy level is adequate Denies suicidal thoughts.  Denies suicidal intent or plan.  Denies having any HI.  Denies having psychotic symptoms.   Denies having side effects to current psychiatric medications.   We discussed compliance to current medication regimen, and patient is appreciative for the treatment received by the treatment team  Discussed the  following psychosocial stressors: Patient reports excitement for being accepted to [facility] treatment center and added that someone from the treatment center will come to get him and transferred him from [facility] to [place].  Reports that the gabapentin is helping with his right thigh pain and will continue to take that as ordered.  Denies any headaches today.  Reports that her attending group activities and therapeutic milieu really helping cope with his problems.  Emotional support provided for patient ongoing stressors. Discussed his discharge plans and mentioned that he should continue to think about long-term options for his post-[place] planning to ensure that he can be successful in his substance use disorder struggle. He voiced hope that he could be a positive force for change.   Total time spent with patient: 30 minutes  Past psychiatric history:  Inpatient hospitalization for self-harm in [place] in remote past, 3 inpatient rehab stays. History significant for: MDD, PTSD, polysubstance use. Rehab and detoxification several times in the past, most recent in 2022. Complete sobriety in 2013-2016.   Past Medical History:  Past Medical History:  Diagnosis Date   Alcohol dependence with withdrawal (HCC)    Anxiety    Arrhythmia    Asthma    Atrial fibrillation (HCC)    not on AC   Bipolar 1 disorder (HCC)    Class 2 obesity    Cocaine abuse (HCC)    Depression    Heart palpitations    with severe anxiety   Hyperlipidemia    Hypertension    PTSD (post-traumatic stress disorder)    PTSD (post-traumatic stress disorder)    Seizure (HCC)    Syncope and collapse  Past Surgical History:  Procedure Laterality Date   NO PAST SURGERIES     Family History:  Family History  Problem Relation Age of Onset   Alcohol abuse Mother    Heart disease Mother    Hypertension Father    Alcohol abuse Maternal Grandfather    Family Psychiatric History:  two deaths by suicide on maternal  side   Social History:  Social History   Substance and Sexual Activity  Alcohol Use Yes     Social History   Substance and Sexual Activity  Drug Use Yes   Types: Cocaine    Social History   Socioeconomic History   Marital status: Single    Spouse name: Not on file   Number of children: Not on file   Years of education: Not on file   Highest education level: Not on file  Occupational History   Occupation: disabled  Tobacco Use   Smoking status: Never    Passive exposure: Current (Indoor smoking)   Smokeless tobacco: Never  Vaping Use   Vaping status: Never Used  Substance and Sexual Activity   Alcohol use: Yes   Drug use: Yes    Types: Cocaine   Sexual activity: Not Currently    Comment: pt said he has an enlarged prostate  Other Topics Concern   Not on file  Social History Narrative   ** Merged History Encounter **       Social Determinants of Health   Financial Resource Strain: Not on file  Food Insecurity: Food Insecurity Present (03/17/2023)   Hunger Vital Sign    Worried About Running Out of Food in the Last Year: Often true    Ran Out of Food in the Last Year: Sometimes true  Transportation Needs: Unmet Transportation Needs (03/17/2023)   PRAPARE - Administrator, Civil Service (Medical): Yes    Lack of Transportation (Non-Medical): Yes  Physical Activity: Not on file  Stress: Not on file  Social Connections: Unknown (06/13/2022)   Received from [employer] [facility]   Family and [employer] Support    Help with Day-to-Day Activities: Not on file    Lonely or Isolated: Not on file   Additional Social History:   Collateral obtained from:  Current Medications: Current Facility-Administered Medications  Medication Dose Route Frequency Provider Last Rate Last Admin   acetaminophen (TYLENOL) tablet 650 mg  650 mg Oral Q6H PRN Onuoha, Chinwendu V, NP   650 mg at 03/22/23 0643   albuterol (VENTOLIN HFA) 108 (90  Base) MCG/ACT inhaler 1-2 puff  1-2 puff Inhalation Q4H PRN Massengill, Harrold Donath, MD       alum & mag hydroxide-simeth (MAALOX/MYLANTA) 200-200-20 MG/5ML suspension 30 mL  30 mL Oral Q4H PRN Onuoha, Chinwendu V, NP       amLODipine (NORVASC) tablet 10 mg  10 mg Oral Daily [name], MD   10 mg at 03/22/23 0805   aspirin-acetaminophen-caffeine (EXCEDRIN MIGRAINE) per tablet 1 tablet  1 tablet Oral Q8H PRN [name], MD       cloNIDine (CATAPRES) tablet 0.1 mg  0.1 mg Oral Q4H PRN Massengill, Harrold Donath, MD   0.1 mg at 03/19/23 7846   diphenhydrAMINE (BENADRYL) capsule 50 mg  50 mg Oral TID PRN Onuoha, Chinwendu V, NP       Or   diphenhydrAMINE (BENADRYL) injection 50 mg  50 mg Intramuscular TID PRN Onuoha, Chinwendu V, NP       gabapentin (  NEURONTIN) capsule 100 mg  100 mg Oral TID [name], MD   100 mg at 03/22/23 1621   haloperidol (HALDOL) tablet 5 mg  5 mg Oral TID PRN Onuoha, Chinwendu V, NP       Or   haloperidol lactate (HALDOL) injection 5 mg  5 mg Intramuscular TID PRN Onuoha, Chinwendu V, NP       hydrOXYzine (ATARAX) tablet 25 mg  25 mg Oral TID PRN Onuoha, Chinwendu V, NP   25 mg at 03/22/23 0805   LORazepam (ATIVAN) tablet 2 mg  2 mg Oral TID PRN Onuoha, Chinwendu V, NP       Or   LORazepam (ATIVAN) injection 2 mg  2 mg Intramuscular TID PRN Onuoha, Chinwendu V, NP       LORazepam (ATIVAN) injection 2 mg  2 mg Intramuscular BID PRN Massengill, Harrold Donath, MD       magnesium hydroxide (MILK OF MAGNESIA) suspension 30 mL  30 mL Oral Daily PRN Onuoha, Chinwendu V, NP       multivitamin with minerals tablet 1 tablet  1 tablet Oral Daily Massengill, Nathan, MD   1 tablet at 03/22/23 0805   PARoxetine (PAXIL) tablet 40 mg  40 mg Oral Daily [name], MD   40 mg at 03/22/23 0805   prazosin (MINIPRESS) capsule 1 mg  1 mg Oral QHS [name], MD   1 mg at 03/21/23 2130   thiamine (Vitamin B-1)  tablet 100 mg  100 mg Oral Daily Massengill, Harrold Donath, MD   100 mg at 03/22/23 0805   traZODone (DESYREL) tablet 50 mg  50 mg Oral QHS PRN Onuoha, Chinwendu V, NP       Lab Results:  No results found for this or any previous visit (from the past 48 hour(s)).  Blood Alcohol level:  Lab Results  Component Value Date   ETH <10 03/16/2023   ETH 92 (H) 03/04/2023    Metabolic Disorder Labs: Lab Results  Component Value Date   HGBA1C 6.0 (H) 06/20/2020   MPG 126 06/20/2020   No results found for: "PROLACTIN" Lab Results  Component Value Date   CHOL 130 06/19/2020   TRIG 67 06/19/2020   HDL 27 (L) 06/19/2020   CHOLHDL 4.8 06/19/2020   VLDL 13 06/19/2020   LDLCALC 90 06/19/2020    Physical Findings: AIMS:  , ,  ,  ,    CIWA:  CIWA-Ar Total: 0 COWS:     Musculoskeletal: Strength & Muscle Tone: within normal limits Gait & Station: normal Patient leans: N/A  Psychiatric Specialty Exam:  Presentation  General Appearance:   Appropriate for Environment; Casual  Eye Contact:  Good  Speech:  Clear and Coherent; Normal Rate  Speech Volume:  Normal  Handedness:  Right  Mood and Affect  Mood:  Anxious; Depressed  Affect:  Congruent  Thought Process  Thought Processes:  Coherent; Goal Directed  Descriptions of Associations: Intact  Orientation: Full (Time, Place and Person)  Thought Content: Logical  History of Schizophrenia/Schizoaffective disorder: No  Duration of Psychotic Symptoms: No data recorded Hallucinations: Hallucinations: None Description of Auditory Hallucinations: Denies  Ideas of Reference: None  Suicidal Thoughts: Suicidal Thoughts: No SI Passive Intent and/or Plan: -- (Denies)  Homicidal Thoughts: Homicidal Thoughts: No   Sensorium  Memory: Immediate Good; Recent Good  Judgment:  Good  Insight:  Good  Executive Functions  Concentration:  Good  Attention Span:  Good  Recall:  Good  Fund of Knowledge:   Good  Language:  Good  Psychomotor Activity  Psychomotor Activity:  Psychomotor Activity: Normal  Physical Exam: Vitals and nursing note reviewed.  Constitutional:      Appearance: He is obese.  HENT:     Head: Normocephalic and atraumatic.  Pulmonary:     Effort: Pulmonary effort is normal.  Skin:    General: Skin is warm and dry.     Comments: Pt has extensive scarring across both arms indicative of prolonged pattern of self-harm via cutting. Pt also has numerous circular wounds in various stages of healing. Pt reports additional scarring on legs from similar.  Neurological:     General: No focal deficit present.     Mental Status: He is alert and oriented to person, place, and time.      Review of Systems: Respiratory:  Negative for cough.   Cardiovascular:  Negative for palpitations. Negative for chest pain.  Musculoskeletal:  Positive for neck pain.  Psychiatric/Behavioral:  Positive for depression, substance abuse. Denies suicidal ideation. The patient is nervous/anxious.    Blood pressure (!) 123/92, pulse 75, temperature 98.3 F (36.8 C), temperature source Oral, resp. rate 18, height 5\' 6"  (1.676 m), weight 112.9 kg, SpO2 98%. Body mass index is 40.19 kg/m.  ASSESSMENT:  [name] is a 63 y M with past psychiatric history of MDD, PTSD, polysubstance use and past medical history of obstructive sleep apnea and repeated episodes of chest pain who presented to the ED under IVC in the setting of auditory and visual hallucinations, increased substance use, and suicidal ideations. Patient medically cleared and admitted to [facility].   7/20: This morning on exam, Mr Ettinger was better than yesterday. He laughed a few times in appropriate ways, and even made jokes of his own. Seems to be deriving genuine benefit from his time on the unit. Mentioned how helpful the group sharing exercise yesterday had been for him. [place] Spaniel that another patient mentioned he should be a motivational  speaker. Reinforced to him that this is a reasonable and achievable goal for him, as there are some recovery programs that pay peer drug abuse support counselors. Also discussed NA and AA programs with him and mentioned that it would be a good way to continue building his network of sober friends.   Increasing the paroxetine to 40 mg. Added on prazosin two days ago, which helped his sleep. Pt will be discharged to [facility] on Tuesday, Jul 23rd.   His pattern of leg pain is most consistent with an iliotibial band injury or sprain. Discussed with patient that we can help treat the pain but medicines would not treat the underlying source of musculoskeletal pain. He should consider moving around more and participating in any range of motion exercises that are offered. Patient would benefit from gabapentin consider an anti-inflammatory if pain is not relieved. Gabapentin would also help with his anxiety and substance use disorder.   Diagnoses / Active Problems:             -- Alcohol Use Disorder, Chronic             -- Stimulant Use Disorder, Cocaine             -- Major Depressive Disorder, Recurrent, Severe             -- Post Traumatic Stress Disorder   PLAN: Safety and Monitoring:             -- Involuntary  admission to inpatient psychiatric unit for safety, stabilization and treatment             -- Daily contact with patient to assess and evaluate symptoms and progress in treatment             -- Patient's case to be discussed in multi-disciplinary team meeting             -- Observation Level : q15 minute checks             -- Vital signs:  q12 hours             -- Precautions: suicide, elopement, and assault   2. Psychiatric Diagnoses and Treatment:              -- Continue gabapentin 100 mg TID for leg pain, AUD, anxiety -- Continue prazosin 1 mg at bedtime for nightmares, insomnia -- Completing Ativan taper per protocol             -- Continue paroxetine to 40 mg  daily for MDD             -- Continue Trazodone 50 mg at bedtime prn for Insomnia, MDD             -- Continue Hydroxyzine 25 mg TID PRN for Anxiety  --  The risks/benefits/side-effects/alternatives to this medication were discussed in detail with the patient and time was given for questions. The patient consents to medication trial.              -- Encouraged patient to participate in unit milieu and in scheduled group therapies              -- Short Term Goals: Ability to disclose and discuss suicidal ideas, Ability to identify and develop effective coping behaviors will improve, and Ability to identify triggers associated with substance abuse/mental health issues will improve             -- Long Term Goals: Improvement in symptoms so as ready for discharge                3. Medical Issues Being Addressed:              Labs review, notable for positive UDS for cocaine, elevated glucose, low calcium, albumin, and total protein, low WBC.              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             -- HTN - amlodipine 5 mg daily, clonidine 0.1 mg PRN             -- Malnutrition due to alcohol - multivitamin daily, B1 100 mg daily,   4. Discharge Planning:              -- Social work and case management to assist with discharge planning and identification of hospital follow-up needs prior to discharge             -- Estimated LOS: 5-7 days             -- Discharge Concerns: Need to establish a safety plan; Medication compliance and effectiveness             -- Discharge Goals: Rehab facility I certify that inpatient services furnished can reasonably be expected to improve the patient's condition.     Cecilie Lowers, FNP 03/22/2023, 5:10 PM Patient ID: Aggie Hacker,  male   DOB: 1971-08-05, 52 y.o.   MRN: [id number]

## 2023-03-22 NOTE — [facility] Group Notes (Signed)
[facility] Group Notes:  (Nursing/MHT/Case Management/Adjunct)  Date:  03/22/2023  Time:  9:12 PM  Type of Therapy:   Wrap-up group  Participation Level:  Did Not Attend  Participation Quality:    Affect:    Cognitive:    Insight:    Engagement in Group:    Modes of Intervention:    Summary of Progress/Problems: Pt refused to attend group.  [name] 03/22/2023, 9:12 PM

## 2023-03-23 ENCOUNTER — Encounter (HOSPITAL_COMMUNITY): Payer: Self-pay

## 2023-03-23 MED ORDER — PAROXETINE HCL 40 MG PO TABS: 40.0000 mg | ORAL_TABLET | Freq: Every day | ORAL | 0 refills | Status: AC

## 2023-03-23 MED ORDER — AMLODIPINE BESYLATE 10 MG PO TABS: 10.0000 mg | ORAL_TABLET | Freq: Every day | ORAL | 0 refills | Status: AC

## 2023-03-23 MED ORDER — ALBUTEROL SULFATE HFA 108 (90 BASE) MCG/ACT IN AERS: 1.0000 | INHALATION_SPRAY | RESPIRATORY_TRACT | 0 refills | Status: AC | PRN

## 2023-03-23 MED ORDER — GABAPENTIN 100 MG PO CAPS: 100.0000 mg | ORAL_CAPSULE | Freq: Three times a day (TID) | ORAL | 0 refills | Status: AC

## 2023-03-23 MED ORDER — HYDROXYZINE HCL 25 MG PO TABS: 25.0000 mg | ORAL_TABLET | Freq: Three times a day (TID) | ORAL | 0 refills | Status: AC | PRN

## 2023-03-23 MED ORDER — ADULT MULTIVITAMIN W/MINERALS CH: 1.0000 | ORAL_TABLET | Freq: Every day | ORAL | 0 refills | Status: AC

## 2023-03-23 MED ORDER — PRAZOSIN HCL 1 MG PO CAPS: 1.0000 mg | ORAL_CAPSULE | Freq: Every day | ORAL | 0 refills | Status: AC

## 2023-03-23 MED ORDER — VITAMIN B-1 100 MG PO TABS: 100.0000 mg | ORAL_TABLET | Freq: Every day | ORAL | 0 refills | Status: AC

## 2023-03-23 NOTE — BH IP Treatment Plan (Signed)
Interdisciplinary Treatment and Diagnostic Plan Update  03/23/2023 Time of Session: 9:40 AM ( UPDATE )  [name] MRN: [id number]  Principal Diagnosis: MDD (major depressive disorder), recurrent, severe, with psychosis (HCC)  Secondary Diagnoses: Principal Problem:   MDD (major depressive disorder), recurrent, severe, with psychosis (HCC) Active Problems:   PTSD (post-traumatic stress disorder)   Stimulant use disorder   Alcohol dependence with alcohol-induced mood disorder (HCC)   GAD (generalized anxiety disorder)   Current Medications:  Current Facility-Administered Medications  Medication Dose Route Frequency Provider Last Rate Last Admin   acetaminophen (TYLENOL) tablet 650 mg  650 mg Oral Q6H PRN Onuoha, Chinwendu V, NP   650 mg at 03/23/23 0619   albuterol (VENTOLIN HFA) 108 (90 Base) MCG/ACT inhaler 1-2 puff  1-2 puff Inhalation Q4H PRN Massengill, Harrold Donath, MD       alum & mag hydroxide-simeth (MAALOX/MYLANTA) 200-200-20 MG/5ML suspension 30 mL  30 mL Oral Q4H PRN Onuoha, Chinwendu V, NP       amLODipine (NORVASC) tablet 10 mg  10 mg Oral Daily [name], MD   10 mg at 03/23/23 0756   aspirin-acetaminophen-caffeine (EXCEDRIN MIGRAINE) per tablet 1 tablet  1 tablet Oral Q8H PRN [name], MD       cloNIDine (CATAPRES) tablet 0.1 mg  0.1 mg Oral Q4H PRN Massengill, Harrold Donath, MD   0.1 mg at 03/19/23 4098   diphenhydrAMINE (BENADRYL) capsule 50 mg  50 mg Oral TID PRN Onuoha, Chinwendu V, NP       Or   diphenhydrAMINE (BENADRYL) injection 50 mg  50 mg Intramuscular TID PRN Onuoha, Chinwendu V, NP       gabapentin (NEURONTIN) capsule 100 mg  100 mg Oral TID [name], MD   100 mg at 03/23/23 1329   haloperidol (HALDOL) tablet 5 mg  5 mg Oral TID PRN Onuoha, Chinwendu V, NP       Or   haloperidol lactate (HALDOL) injection 5 mg  5 mg Intramuscular TID PRN Onuoha, Chinwendu V, NP       hydrOXYzine (ATARAX) tablet 25  mg  25 mg Oral TID PRN Onuoha, Chinwendu V, NP   25 mg at 03/22/23 0805   LORazepam (ATIVAN) tablet 2 mg  2 mg Oral TID PRN Onuoha, Chinwendu V, NP       Or   LORazepam (ATIVAN) injection 2 mg  2 mg Intramuscular TID PRN Onuoha, Chinwendu V, NP       LORazepam (ATIVAN) injection 2 mg  2 mg Intramuscular BID PRN Massengill, Harrold Donath, MD       magnesium hydroxide (MILK OF MAGNESIA) suspension 30 mL  30 mL Oral Daily PRN Onuoha, Chinwendu V, NP       multivitamin with minerals tablet 1 tablet  1 tablet Oral Daily Massengill, Nathan, MD   1 tablet at 03/23/23 0756   PARoxetine (PAXIL) tablet 40 mg  40 mg Oral Daily [name], MD   40 mg at 03/23/23 0756   prazosin (MINIPRESS) capsule 1 mg  1 mg Oral QHS [name], MD   1 mg at 03/22/23 2105   thiamine (Vitamin B-1) tablet 100 mg  100 mg Oral Daily Massengill, Harrold Donath, MD   100 mg at 03/23/23 0756   traZODone (DESYREL) tablet 50 mg  50 mg Oral QHS PRN Onuoha, Chinwendu V, NP       PTA Medications: Medications Prior to Admission  Medication Sig Dispense Refill Last  Dose   amLODipine (NORVASC) 5 MG tablet Take 1 tablet (5 mg total) by mouth daily. 30 tablet 1    cariprazine (VRAYLAR) 1.5 MG capsule Take 1 capsule (1.5 mg total) by mouth daily. (Patient not taking: Reported on 03/04/2023) 10 capsule 0    cariprazine (VRAYLAR) 1.5 MG capsule Take 1 capsule (1.5 mg total) by mouth daily. (Patient not taking: Reported on 03/16/2023) 30 capsule 2    chlordiazePOXIDE (LIBRIUM) 25 MG capsule 50mg  PO TID x 1D, then 25-50mg  PO BID X 1D, then 25-50mg  PO QD X 1D (Patient taking differently: Take 25 mg by mouth daily. 50mg  PO TID x 1D, then 25-50mg  PO BID X 1D, then 25-50mg  PO QD X 1D) 10 capsule 0     Patient Stressors:    Patient Strengths:    Treatment Modalities: Medication Management, Group therapy, Case management,  1 to 1 session with clinician, Psychoeducation, Recreational therapy.   Physician Treatment Plan  for Primary Diagnosis: MDD (major depressive disorder), recurrent, severe, with psychosis (HCC) Long Term Goal(s): Improvement in symptoms so as ready for discharge   Short Term Goals: Ability to disclose and discuss suicidal ideas Ability to identify and develop effective coping behaviors will improve Ability to identify triggers associated with substance abuse/mental health issues will improve  Medication Management: Evaluate patient's response, side effects, and tolerance of medication regimen.  Therapeutic Interventions: 1 to 1 sessions, Unit Group sessions and Medication administration.  Evaluation of Outcomes: Progressing  Physician Treatment Plan for Secondary Diagnosis: Principal Problem:   MDD (major depressive disorder), recurrent, severe, with psychosis (HCC) Active Problems:   PTSD (post-traumatic stress disorder)   Stimulant use disorder   Alcohol dependence with alcohol-induced mood disorder (HCC)   GAD (generalized anxiety disorder)  Long Term Goal(s): Improvement in symptoms so as ready for discharge   Short Term Goals: Ability to disclose and discuss suicidal ideas Ability to identify and develop effective coping behaviors will improve Ability to identify triggers associated with substance abuse/mental health issues will improve     Medication Management: Evaluate patient's response, side effects, and tolerance of medication regimen.  Therapeutic Interventions: 1 to 1 sessions, Unit Group sessions and Medication administration.  Evaluation of Outcomes: Progressing   RN Treatment Plan for Primary Diagnosis: MDD (major depressive disorder), recurrent, severe, with psychosis (HCC) Long Term Goal(s): Knowledge of disease and therapeutic regimen to maintain health will improve  Short Term Goals: Ability to remain free from injury will improve, Ability to verbalize frustration and anger appropriately will improve, Ability to participate in decision making will improve,  Ability to verbalize feelings will improve, Ability to identify and develop effective coping behaviors will improve, and Compliance with prescribed medications will improve  Medication Management: RN will administer medications as ordered by provider, will assess and evaluate patient's response and provide education to patient for prescribed medication. RN will report any adverse and/or side effects to prescribing provider.  Therapeutic Interventions: 1 on 1 counseling sessions, Psychoeducation, Medication administration, Evaluate responses to treatment, Monitor vital signs and CBGs as ordered, Perform/monitor CIWA, COWS, AIMS and Fall Risk screenings as ordered, Perform wound care treatments as ordered.  Evaluation of Outcomes: Progressing   LCSW Treatment Plan for Primary Diagnosis: MDD (major depressive disorder), recurrent, severe, with psychosis (HCC) Long Term Goal(s): Safe transition to appropriate next level of care at discharge, Engage patient in therapeutic group addressing interpersonal concerns.  Short Term Goals: Engage patient in aftercare planning with referrals and resources, Increase social support, Increase emotional regulation,  Facilitate acceptance of mental health diagnosis and concerns, Identify triggers associated with mental health/substance abuse issues, and Increase skills for wellness and recovery  Therapeutic Interventions: Assess for all discharge needs, 1 to 1 time with Social worker, Explore available resources and support systems, Assess for adequacy in community support network, Educate family and significant other(s) on suicide prevention, Complete Psychosocial Assessment, Interpersonal group therapy.  Evaluation of Outcomes: Progressing    Progress in Treatment: Attending groups: Yes. Participating in groups: Yes. Taking medication as prescribed: Yes. Toleration medication: Yes. Family/Significant other contact made: Yes:  Donald Pore,  [phone] Patient understands diagnosis: Yes. Discussing patient identified problems/goals with staff: Yes. Medical problems stabilized or resolved: Yes. Denies suicidal/homicidal ideation: Yes. Issues/concerns per patient self-inventory: No. Other: n/a   New problem(s) identified: No, Describe: None reported    New Short Term/Long Term Goal(s): medication stabilization, elimination of SI thoughts, development of comprehensive mental wellness plan.     Patient Goals:  "I want to get stabilized treatment to get into a long term treatment program".    Discharge Plan or Barriers: Patient will be going to [facility] tomorrow    Reason for Continuation of Hospitalization: Depression   Estimated Length of Stay: Patient is scheduled to DC tomorrow     Last 3 [place] Suicide Severity Risk Score: Flowsheet Row Admission (Current) [facility] from 03/16/2023 in [facility] Department at [facility] ED from 03/15/2023 in [facility] Emergency Department at [facility]  C-SSRS RISK CATEGORY Low Risk Moderate Risk No Risk       Last PHQ 2/9 Scores:    09/22/2021   12:30 PM 06/25/2021    2:51 PM 09/19/2020    5:54 PM  Depression screen PHQ 2/9  Decreased Interest 1 1 3   Down, Depressed, Hopeless 1 1 3   PHQ - 2 Score 2 2 6   Altered sleeping 2 3 3   Tired, decreased energy 1 1 3   Change in appetite 0 2 3  Feeling bad or failure about yourself   1 3  Trouble concentrating 1 1 3   Moving slowly or fidgety/restless 0 1 3  Suicidal thoughts 0 0 3  PHQ-9 Score 6 11 27   Difficult doing work/chores Somewhat difficult Somewhat difficult     Scribe for Treatment Team: Beather Arbour 03/23/2023 3:39 PM

## 2023-03-23 NOTE — Group Note (Signed)
Date:  03/23/2023 Time:  9:44 AM  Group Topic/Focus:  Goals Group:   The focus of this group is to help patients establish daily goals to achieve during treatment and discuss how the patient can incorporate goal setting into their daily lives to aide in recovery.    Participation Level:  Active  Participation Quality:  Attentive  Affect:  Appropriate  Cognitive:  Appropriate  Insight: Appropriate  Engagement in Group:  Engaged  Modes of Intervention:  Discussion  Additional Comments:  Goal: Maintain a positive outlook.  Donell Beers 03/23/2023, 9:44 AM

## 2023-03-23 NOTE — Plan of Care (Signed)

## 2023-03-23 NOTE — Group Note (Unsigned)
Date:  03/23/2023 Time:  9:40 AM  Group Topic/Focus:  Goals Group:   The focus of this group is to help patients establish daily goals to achieve during treatment and discuss how the patient can incorporate goal setting into their daily lives to aide in recovery.     Participation Level:  {BHH PARTICIPATION HQION:62952}  Participation Quality:  {BHH PARTICIPATION QUALITY:22265}  Affect:  {BHH AFFECT:22266}  Cognitive:  {BHH COGNITIVE:22267}  Insight: {BHH Insight2:20797}  Engagement in Group:  {BHH ENGAGEMENT IN WUXLK:44010}  Modes of Intervention:  {BHH MODES OF INTERVENTION:22269}  Additional Comments:  ***  Donell Beers 03/23/2023, 9:40 AM

## 2023-03-23 NOTE — Group Note (Signed)
Recreation Therapy Group Note   Group Topic:Team Building  Group Date: 03/23/2023 Start Time: 0935 End Time: 1015 Facilitators: Audri Kozub-McCall, LRT,CTRS Location: [address] Dayroom   Goal Area(s) Addresses:  Patient will effectively work with peer towards shared goal.  Patient will identify skills used to make activity successful.  Patient will identify how skills used during activity can be used to reach post d/c goals.   Group Description: Straw Bridge. In teams of 3-5, patients were given 15 plastic drinking straws and an equal length of masking tape. Using the materials provided, patients were instructed to build a free standing bridge-like structure to suspend an everyday item (ex: puzzle box) off of the floor or table surface. All materials were required to be used by the team in their design. LRT facilitated post-activity discussion reviewing team process. Patients were encouraged to reflect how the skills used in this activity can be generalized to daily life post discharge.    Affect/Mood: Appropriate   Participation Level: Engaged   Participation Quality: Independent   Behavior: Appropriate   Speech/Thought Process: Focused   Insight: Good   Judgement: Good   Modes of Intervention: STEM Activity   Patient Response to Interventions:  Engaged   Education Outcome:  Acknowledges education   Clinical Observations/Individualized Feedback: Pt attended and participated in group session.     Plan: Continue to engage patient in RT group sessions 2-3x/week.   Jeyla Bulger-McCall, LRT,CTRS 03/23/2023 12:31 PM

## 2023-03-23 NOTE — Group Note (Signed)
Occupational Therapy Group Note  Group Topic: Sleep Hygiene  Group Date: 03/23/2023 Start Time: 1430 End Time: 1500 Facilitators: Ted Mcalpine, OT   Group Description: Group encouraged increased participation and engagement through topic focused on sleep hygiene. Patients reflected on the quality of sleep they typically receive and identified areas that need improvement. Group was given background information on sleep and sleep hygiene, including common sleep disorders. Group members also received information on how to improve one's sleep and introduced a sleep diary as a tool that can be utilized to track sleep quality over a length of time. Group session ended with patients identifying one or more strategies they could utilize or implement into their sleep routine in order to improve overall sleep quality.        Therapeutic Goal(s):  Identify one or more strategies to improve overall sleep hygiene  Identify one or more areas of sleep that are negatively impacted (sleep too much, too little, etc)     Participation Level: Minimal   Participation Quality: Independent   Behavior: Appropriate   Speech/Thought Process: Loose association    Affect/Mood: Appropriate   Insight: Fair   Judgement: Fair      Modes of Intervention: Education  Patient Response to Interventions:  Attentive   Plan: Continue to engage patient in OT groups 2 - 3x/week.  03/23/2023  Ted Mcalpine, OT  Kerrin Champagne, OT

## 2023-03-23 NOTE — BHH Group Notes (Signed)

## 2023-03-23 NOTE — Progress Notes (Signed)
   03/23/23 0800  Psych Admission Type (Psych Patients Only)  Admission Status Voluntary  Psychosocial Assessment  Patient Complaints Anxiety  Eye Contact Fair  Facial Expression Sad  Affect Sad  Speech Logical/coherent  Interaction Assertive  Motor Activity Slow  Appearance/Hygiene Unremarkable  Behavior Characteristics Cooperative;Appropriate to situation  Mood Euthymic;Pleasant  Aggressive Behavior  Effect No apparent injury  Thought Process  Coherency Circumstantial  Content WDL  Delusions None reported or observed  Perception WDL  Hallucination None reported or observed  Judgment WDL  Confusion None  Danger to Self  Current suicidal ideation? Denies  Self-Injurious Behavior No self-injurious ideation or behavior indicators observed or expressed   Agreement Not to Harm Self Yes  Description of Agreement Verbal  Danger to Others  Danger to Others None reported or observed

## 2023-03-23 NOTE — Group Note (Signed)
Date:  03/23/2023 Time:  11:03 AM  Group Topic/Focus:  Emotional Education:   The focus of this group is to discuss what feelings/emotions are, and how they are experienced.    Participation Level:  Active  Participation Quality:  Appropriate  Affect:  Appropriate  Cognitive:  Appropriate  Insight: Appropriate  Engagement in Group:  Engaged  Modes of Intervention:  Activity  Additional Comments:    Donell Beers 03/23/2023, 11:03 AM

## 2023-03-23 NOTE — Progress Notes (Signed)
BHH/BMU LCSW Progress Note   03/23/2023    1:47 PM  Aggie Hacker      Type of Note: Medicare Notice Given    Patient informed of right to appeal discharge, provided phone number to [facility]. Patient expressed no interest in appealing discharge at this time. CSW will continue to monitor situation.     Signed:   Jacob Moores, MSW, [facility] 03/23/2023 1:47 PM

## 2023-03-23 NOTE — BHH Group Notes (Signed)
BHH Group Notes:  (Nursing/MHT/Case Management/Adjunct)  Date:  03/23/2023  Time:  2000  Type of Therapy:   Alcoholics Anonymous Meeting  Participation Level:  Active  Participation Quality:  Appropriate and Attentive  Affect:  Appropriate  Cognitive:  Alert  Insight:  Improving  Engagement in Group:  Engaged  Modes of Intervention:  Education and Support  Summary of Progress/Problems:  [name] 03/23/2023, 10:53 PM
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
## 2023-03-24 NOTE — Group Note (Signed)
LCSW Group Therapy Note  Group Date: 03/24/2023 Start Time: 1100 End Time: 1145   Type of Therapy and Topic:  Group Therapy - Healthy vs Unhealthy Coping Skills  Participation Level:  Did Not Attend   Description of Group The focus of this group was to determine what unhealthy coping techniques typically are used by group members and what healthy coping techniques would be helpful in coping with various problems. Patients were guided in becoming aware of the differences between healthy and unhealthy coping techniques. Patients were asked to identify 2-3 healthy coping skills they would like to learn to use more effectively.  Therapeutic Goals Patients learned that coping is what human beings do all day long to deal with various situations in their lives Patients defined and discussed healthy vs unhealthy coping techniques Patients identified their preferred coping techniques and identified whether these were healthy or unhealthy Patients determined 2-3 healthy coping skills they would like to become more familiar with and use more often. Patients provided support and ideas to each other     Therapeutic Modalities Cognitive Behavioral Therapy Motivational Interviewing  Alanda Amass 03/24/2023  1:27 PM

## 2023-03-24 NOTE — Progress Notes (Signed)
  [facility] Adult Case Management Discharge Plan :  Will you be returning to the same living situation after discharge:  No.Patient will be DC to [facility]  At discharge, do you have transportation home?: Yes,  [facility] has arranged transportation for [place]  Do you have the ability to pay for your medications: Yes,  [employer]   Release of information consent forms completed and in the chart;  Patient's signature needed at discharge.  Patient to Follow up at:  Follow-up Information     [facility]. Go to.   Specialty: Behavioral Health Why: For fastest service, please go to this provider for an assessment, to obtain therapy and medication management services, on Monday through Friday, arrive by 7:00 am. Please follow up with this provider once you complete your detox journey. Contact information: [address] [place] [address] [phone]        [employer] Go on 03/24/2023.   Why: You were accepted to this residenital , they have already arranged tansportation for [place]. Contact information: [address] [place] [address] [phone]                 Next level of care provider has access to [facility]:yes  Safety Planning and Suicide Prevention discussed: Yes,  [name] [phone]     Has patient been referred to the Quitline?: Patient does not use tobacco/nicotine products  Patient has been referred for addiction treatment: Yes; Patient was accepted to [facility] for his addiction   Beather Arbour 03/24/2023, 9:36 AM

## 2023-03-24 NOTE — Discharge Instructions (Signed)
-  Follow-up with your outpatient psychiatric provider -instructions on appointment date, time, and address (location) are provided to you in discharge paperwork.  -Take your psychiatric medications as prescribed at discharge - instructions are provided to you in the discharge paperwork You are getting printed prescriptions for 30 days at discharge, that you will take to [facility].   -Follow-up with outpatient primary care doctor and other specialists -for management of preventative medicine and any chronic medical disease.  -Recommend abstinence from alcohol, tobacco, and other illicit drug use at discharge.   -If your psychiatric symptoms recur, worsen, or if you have side effects to your psychiatric medications, call your outpatient psychiatric provider, 911, [phone] or go to the nearest emergency department.  -If suicidal thoughts occur, call your outpatient psychiatric provider, 911, [phone] or go to the nearest emergency department.  Naloxone (Narcan) can help reverse an overdose when given to the victim quickly.  [facility] offers free naloxone kits and instructions/training on its use.  Add naloxone to your first aid kit and you can help save a life.   Pick up your free kit at the following locations:   [name]:  [facility], [address] [place] [address] [phone]) [facility] [address] [place] [address] [phone]) [facility] [address] [place] [address]  [place]: [facility] [address] [place] [address] ([phone]) [facility] [address] [place] [address] [phone])

## 2023-03-24 NOTE — Progress Notes (Signed)
   03/23/23 2300  Psych Admission Type (Psych Patients Only)  Admission Status Voluntary  Psychosocial Assessment  Patient Complaints Anxiety  Eye Contact Fair  Facial Expression Animated  Affect Appropriate to circumstance  Speech Logical/coherent  Interaction Assertive  Motor Activity Slow  Appearance/Hygiene Unremarkable  Behavior Characteristics Cooperative;Appropriate to situation  Mood Pleasant  Thought Process  Coherency Circumstantial  Content WDL  Delusions None reported or observed  Perception WDL  Hallucination None reported or observed  Judgment WDL  Confusion None  Danger to Self  Current suicidal ideation? Denies  Self-Injurious Behavior No self-injurious ideation or behavior indicators observed or expressed   Agreement Not to Harm Self No  Description of Agreement verbal  Danger to Others  Danger to Others None reported or observed

## 2023-03-24 NOTE — Progress Notes (Signed)
   03/24/23 0854  Psych Admission Type (Psych Patients Only)  Admission Status Voluntary  Psychosocial Assessment  Patient Complaints None  Eye Contact Fair  Facial Expression Animated  Affect Appropriate to circumstance  Speech Logical/coherent  Interaction Assertive  Motor Activity Slow  Appearance/Hygiene Unremarkable  Behavior Characteristics Cooperative;Appropriate to situation  Mood Pleasant  Thought Process  Coherency WDL  Content WDL  Delusions None reported or observed  Perception WDL  Hallucination None reported or observed  Judgment WDL  Confusion None  Danger to Self  Current suicidal ideation? Denies  Self-Injurious Behavior No self-injurious ideation or behavior indicators observed or expressed   Agreement Not to Harm Self No  Description of Agreement verbal  Danger to Others  Danger to Others None reported or observed

## 2023-03-24 NOTE — Group Note (Signed)
Recreation Therapy Group Note   Group Topic:Animal Assisted Therapy   Group Date: 03/24/2023 Start Time: 6269 End Time: 1034 Facilitators: Kouper Spinella-McCall, LRT,CTRS Location: [address] Dayroom   Animal-Assisted Activity (AAA) Program Checklist/Progress Notes Patient Eligibility Criteria Checklist & Daily Group note for Rec Tx Intervention  AAA/T Program Assumption of Risk Form signed by Patient/ or Parent Legal Guardian Yes  Patient understands his/her participation is voluntary Yes   Affect/Mood: N/A   Participation Level: Did not attend    Clinical Observations/Individualized Feedback:     Plan: Continue to engage patient in RT group sessions 2-3x/week.   Keyna Blizard-McCall, LRT,CTRS 03/24/2023 1:16 PM

## 2023-03-24 NOTE — Progress Notes (Signed)
Patent discharged to home via private car. Patient denies SI/thoughts of self harm, A/V/H at time of discharge. Discharge instructions, medications, and appointments discussed prior to discharge, patient verbalized understanding. All items in the locker/at bedside returned at exit.

## 2023-04-17 IMAGING — DX DG CHEST 1V PORT
1 series · 1 of 1 positions shown · non-contrast
Comparison: 09/23/2021.

CLINICAL DATA: cough

EXAM:
PORTABLE CHEST 1 VIEW

[chest]
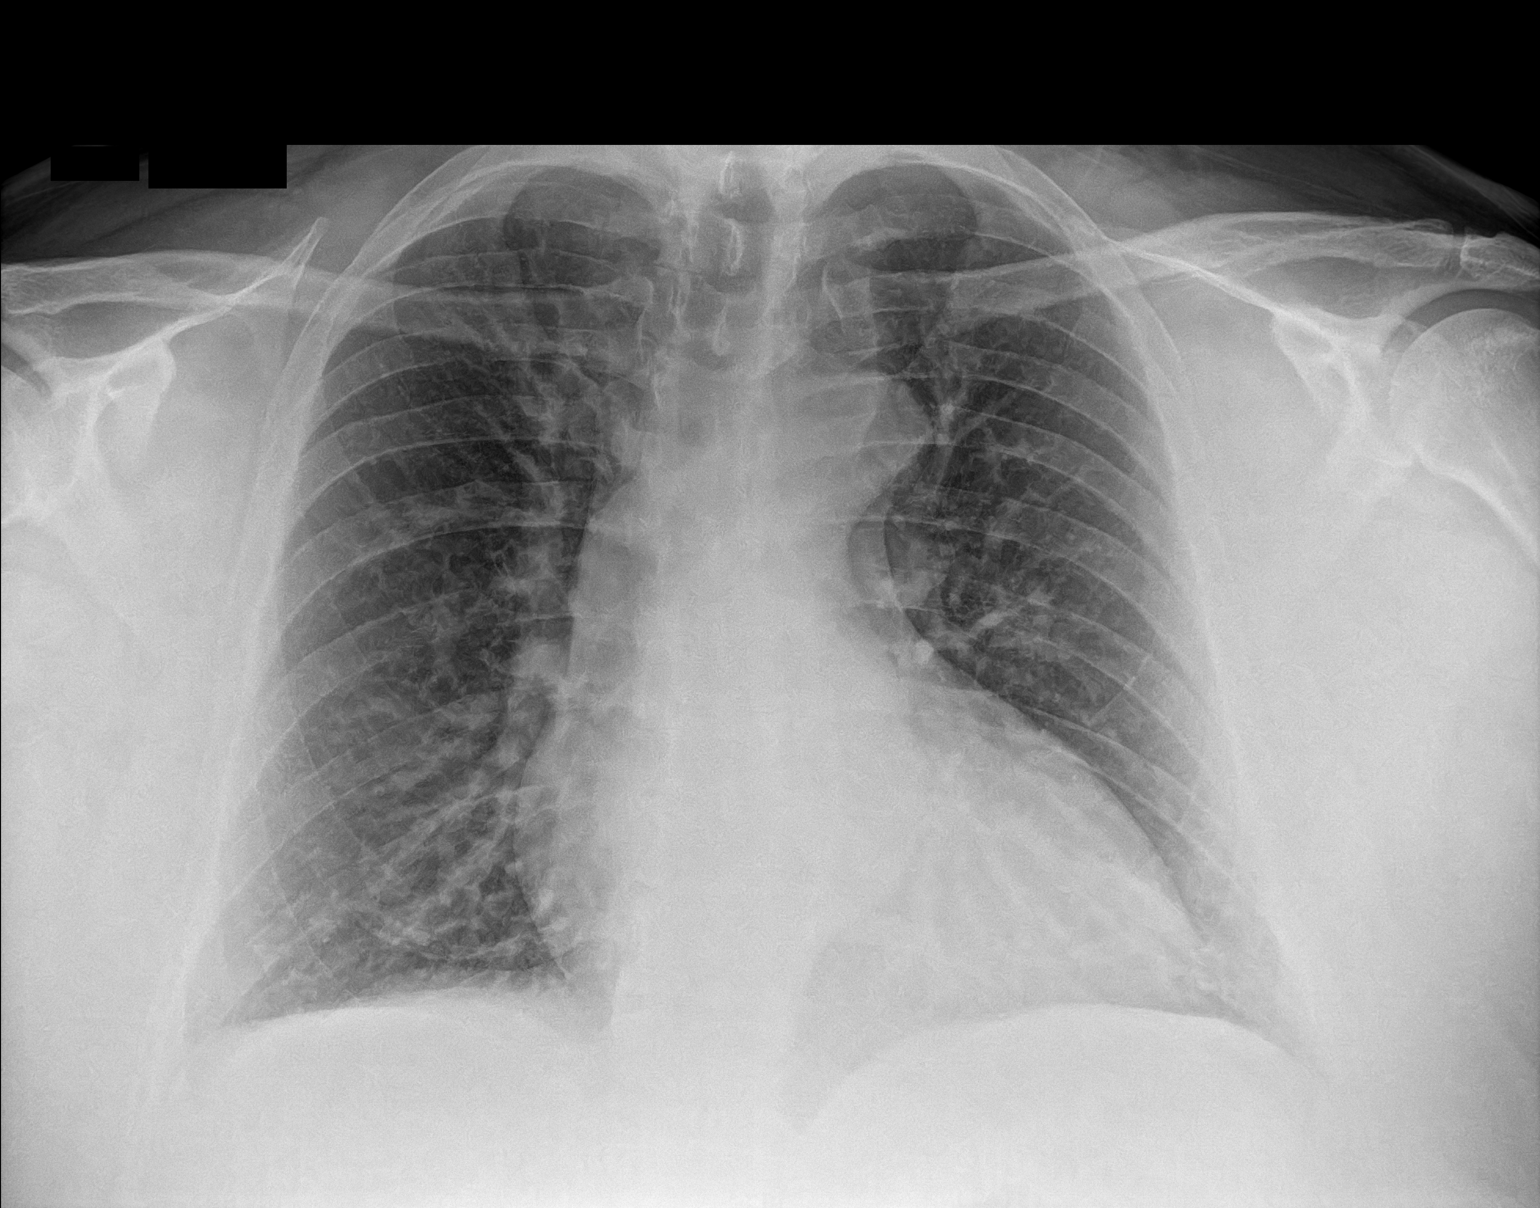

[1 of 1 positions shown; findings below may reference images not displayed]

FINDINGS: No consolidation. No visible pleural effusions or pneumothorax.
Enlarged cardiac silhouette, similar to prior.
IMPRESSION: No evidence of acute cardiopulmonary disease.

## 2023-07-27 ENCOUNTER — Other Ambulatory Visit (HOSPITAL_BASED_OUTPATIENT_CLINIC_OR_DEPARTMENT_OTHER): Payer: Self-pay
